# Patient Record
Sex: Male | Born: 1935 | Race: White | Hispanic: No | Marital: Married | Smoking: Never smoker
Health system: Southern US, Community
[De-identification: ages and names within clinical notes are randomized; demographics above are authoritative.]

## PROBLEM LIST (undated history)

## (undated) DIAGNOSIS — H409 Unspecified glaucoma: Secondary | ICD-10-CM

## (undated) DIAGNOSIS — D4819 Other specified neoplasm of uncertain behavior of connective and other soft tissue: Secondary | ICD-10-CM

## (undated) DIAGNOSIS — T8859XA Other complications of anesthesia, initial encounter: Secondary | ICD-10-CM

## (undated) DIAGNOSIS — G4730 Sleep apnea, unspecified: Secondary | ICD-10-CM

## (undated) DIAGNOSIS — T4145XA Adverse effect of unspecified anesthetic, initial encounter: Secondary | ICD-10-CM

## (undated) DIAGNOSIS — M1990 Unspecified osteoarthritis, unspecified site: Secondary | ICD-10-CM

## (undated) DIAGNOSIS — I219 Acute myocardial infarction, unspecified: Secondary | ICD-10-CM

## (undated) DIAGNOSIS — I4891 Unspecified atrial fibrillation: Secondary | ICD-10-CM

## (undated) DIAGNOSIS — IMO0002 Reserved for concepts with insufficient information to code with codable children: Secondary | ICD-10-CM

## (undated) DIAGNOSIS — F0390 Unspecified dementia without behavioral disturbance: Secondary | ICD-10-CM

## (undated) DIAGNOSIS — I639 Cerebral infarction, unspecified: Secondary | ICD-10-CM

## (undated) DIAGNOSIS — I10 Essential (primary) hypertension: Secondary | ICD-10-CM

## (undated) DIAGNOSIS — G43909 Migraine, unspecified, not intractable, without status migrainosus: Secondary | ICD-10-CM

## (undated) DIAGNOSIS — I2510 Atherosclerotic heart disease of native coronary artery without angina pectoris: Secondary | ICD-10-CM

## (undated) DIAGNOSIS — E7800 Pure hypercholesterolemia, unspecified: Secondary | ICD-10-CM

## (undated) DIAGNOSIS — D481 Neoplasm of uncertain behavior of connective and other soft tissue: Secondary | ICD-10-CM

## (undated) DIAGNOSIS — E119 Type 2 diabetes mellitus without complications: Secondary | ICD-10-CM

## (undated) HISTORY — PX: TONSILLECTOMY: SUR1361

## (undated) HISTORY — PX: CATARACT EXTRACTION W/ INTRAOCULAR LENS  IMPLANT, BILATERAL: SHX1307

## (undated) HISTORY — DX: Pure hypercholesterolemia, unspecified: E78.00

## (undated) HISTORY — DX: Atherosclerotic heart disease of native coronary artery without angina pectoris: I25.10

## (undated) HISTORY — DX: Migraine, unspecified, not intractable, without status migrainosus: G43.909

## (undated) HISTORY — PX: APPENDECTOMY: SHX54

## (undated) HISTORY — PX: HERNIA REPAIR: SHX51

## (undated) HISTORY — PX: HEMORRHOID SURGERY: SHX153

## (undated) HISTORY — DX: Unspecified osteoarthritis, unspecified site: M19.90

## (undated) HISTORY — DX: Sleep apnea, unspecified: G47.30

## (undated) HISTORY — DX: Reserved for concepts with insufficient information to code with codable children: IMO0002

## (undated) HISTORY — PX: CHOLECYSTECTOMY: SHX55

## (undated) HISTORY — DX: Type 2 diabetes mellitus without complications: E11.9

## (undated) HISTORY — DX: Essential (primary) hypertension: I10

## (undated) HISTORY — DX: Unspecified glaucoma: H40.9

---

## 1984-10-09 HISTORY — PX: OTHER SURGICAL HISTORY: SHX169

## 1994-10-09 DIAGNOSIS — I219 Acute myocardial infarction, unspecified: Secondary | ICD-10-CM

## 1994-10-09 HISTORY — DX: Acute myocardial infarction, unspecified: I21.9

## 1995-04-09 HISTORY — PX: CORONARY ARTERY BYPASS GRAFT: SHX141

## 2005-01-26 ENCOUNTER — Ambulatory Visit: Payer: Self-pay | Admitting: Gastroenterology

## 2005-02-07 ENCOUNTER — Ambulatory Visit: Payer: Self-pay | Admitting: Internal Medicine

## 2005-05-17 ENCOUNTER — Emergency Department: Payer: Self-pay | Admitting: Emergency Medicine

## 2006-01-16 ENCOUNTER — Ambulatory Visit: Payer: Self-pay | Admitting: Internal Medicine

## 2007-09-10 ENCOUNTER — Ambulatory Visit: Payer: Self-pay | Admitting: Internal Medicine

## 2008-04-23 ENCOUNTER — Ambulatory Visit: Payer: Self-pay | Admitting: Gastroenterology

## 2009-08-16 ENCOUNTER — Ambulatory Visit: Payer: Self-pay | Admitting: Internal Medicine

## 2010-12-28 ENCOUNTER — Ambulatory Visit: Payer: Self-pay | Admitting: Internal Medicine

## 2011-01-08 ENCOUNTER — Ambulatory Visit: Payer: Self-pay | Admitting: Internal Medicine

## 2011-02-07 ENCOUNTER — Ambulatory Visit: Payer: Self-pay | Admitting: Internal Medicine

## 2011-07-03 ENCOUNTER — Ambulatory Visit: Payer: Self-pay | Admitting: Internal Medicine

## 2012-01-30 ENCOUNTER — Ambulatory Visit: Payer: Self-pay | Admitting: Gastroenterology

## 2012-01-30 LAB — HM COLONOSCOPY

## 2012-02-01 LAB — PATHOLOGY REPORT

## 2012-02-28 DIAGNOSIS — N400 Enlarged prostate without lower urinary tract symptoms: Secondary | ICD-10-CM | POA: Insufficient documentation

## 2012-08-20 ENCOUNTER — Encounter: Payer: Self-pay | Admitting: Internal Medicine

## 2012-08-20 ENCOUNTER — Ambulatory Visit (INDEPENDENT_AMBULATORY_CARE_PROVIDER_SITE_OTHER): Payer: Medicare Other | Admitting: Internal Medicine

## 2012-08-20 VITALS — BP 136/80 | HR 64 | Temp 98.0°F | Ht 68.0 in | Wt 180.0 lb

## 2012-08-20 DIAGNOSIS — I10 Essential (primary) hypertension: Secondary | ICD-10-CM

## 2012-08-20 DIAGNOSIS — I2510 Atherosclerotic heart disease of native coronary artery without angina pectoris: Secondary | ICD-10-CM

## 2012-08-20 DIAGNOSIS — R739 Hyperglycemia, unspecified: Secondary | ICD-10-CM

## 2012-08-20 DIAGNOSIS — E7800 Pure hypercholesterolemia, unspecified: Secondary | ICD-10-CM

## 2012-08-20 DIAGNOSIS — R7309 Other abnormal glucose: Secondary | ICD-10-CM

## 2012-08-20 DIAGNOSIS — E118 Type 2 diabetes mellitus with unspecified complications: Secondary | ICD-10-CM | POA: Insufficient documentation

## 2012-08-20 NOTE — Patient Instructions (Signed)
It was nice seeing you today.  I am glad you have been doing well.  Keep a close eye on your leg and let me know if any change or if it does not resolve.  We will schedule your labs and follow up.

## 2012-08-30 ENCOUNTER — Other Ambulatory Visit: Payer: Medicare Other

## 2012-08-30 ENCOUNTER — Other Ambulatory Visit (INDEPENDENT_AMBULATORY_CARE_PROVIDER_SITE_OTHER): Payer: Medicare Other

## 2012-08-30 DIAGNOSIS — R739 Hyperglycemia, unspecified: Secondary | ICD-10-CM

## 2012-08-30 DIAGNOSIS — R7309 Other abnormal glucose: Secondary | ICD-10-CM

## 2012-08-30 DIAGNOSIS — E7800 Pure hypercholesterolemia, unspecified: Secondary | ICD-10-CM

## 2012-08-30 DIAGNOSIS — I10 Essential (primary) hypertension: Secondary | ICD-10-CM

## 2012-08-30 LAB — LIPID PANEL
HDL: 40.5 mg/dL (ref >39.00)
LDL Cholesterol: 59 mg/dL (ref 0–99)
Total CHOL/HDL Ratio: 3
Triglycerides: 198 mg/dL — ABNORMAL HIGH (ref 0.0–149.0)

## 2012-08-30 LAB — HEPATIC FUNCTION PANEL
ALT: 22 U/L (ref 0–53)
AST: 27 U/L (ref 0–37)
Albumin: 3.7 g/dL (ref 3.5–5.2)
Alkaline Phosphatase: 44 U/L (ref 39–117)
Bilirubin, Direct: 0.2 mg/dL (ref 0.0–0.3)
Total Bilirubin: 1 mg/dL (ref 0.3–1.2)
Total Protein: 6.6 g/dL (ref 6.0–8.3)

## 2012-08-30 LAB — BASIC METABOLIC PANEL
BUN: 13 mg/dL (ref 6–23)
CO2: 27 mEq/L (ref 19–32)
Calcium: 8.9 mg/dL (ref 8.4–10.5)
Glucose, Bld: 147 mg/dL — ABNORMAL HIGH (ref 70–99)
Sodium: 137 mEq/L (ref 135–145)

## 2012-08-30 LAB — HEMOGLOBIN A1C: Hgb A1c MFr Bld: 6.6 % — ABNORMAL HIGH (ref 4.6–6.5)

## 2012-09-08 ENCOUNTER — Encounter: Payer: Self-pay | Admitting: Internal Medicine

## 2012-09-08 NOTE — Assessment & Plan Note (Signed)
On Vytorin.  Low cholesterol diet and exercise.  Check lipid panel and liver function.    

## 2012-09-08 NOTE — Progress Notes (Signed)
  Subjective:    Patient ID: Kyle Richardson, male    DOB: [birth year], 78 y.o.   MRN: [id number]  HPI 78 year old male with past history of CAD s/p bypass graft x 4 (followed by Dr Darrold Junker), hypertension, hyperglycemia and hypercholesterolemia who comes in today for a scheduled follow up.  States he has been doing well.  No chest pain or tightness.  Breathing stable.  Saw Dr Rosita Kea for his left third finger.  Had surgery.  Did not help.  Still with some pain.  Discussed follow up with Dr Rosita Kea.  Saw Dr Orland Jarred.  Feet ok.  Sees Dr Darrold Junker.  Last check - stable.  He did hit his right leg on a stool - last week.  Hematoma.  Resolving.  No pain with ambulation.  Overall he feels he is doing well.    Past Medical History  Diagnosis Date  . 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  s/p PTCA of the mid RCA 1/96, also  s/p  CABG x 4 7/96  . Hypertension   . Hypercholesterolemia   . Degenerative disc disease   . Migraine headache     history of  . Sleep apnea   . Diabetes mellitus   . Osteoarthritis   . Glaucoma     Review of Systems Patient denies any headache, lightheadedness or dizziness.  No significant sinus or allergy symptoms.  No chest pain, tightness or palpitations.  No increased shortness of breath, cough or congestion.  No nausea or vomiting.  No abdominal pain or cramping.  No bowel change, such as diarrhea, constipation, BRBPR or melana.  No urine change.        Objective:   Physical Exam Filed Vitals:   08/20/12 0944  BP: 136/80  Pulse: 64  Temp: 98 F (22.50 C)   78 year old male in no acute distress.   HEENT:  Nares - clear.  OP- without lesions or erythema.  NECK:  Supple, nontender.  No audible bruit.   HEART:  Appears to be regular. LUNGS:  Without crackles or wheezing audible.  Respirations even and unlabored.   RADIAL PULSE:  Equal bilaterally.  ABDOMEN:  Soft, nontender.  No audible abdominal bruit.   EXTREMITIES:  No increased  edema to be present.  Resolving hematoma right lower leg.  No evidence of active infection.                   Assessment & Plan:  HEMATOMA.  Resolving.  Follow.  Notify me if any problems.    GU.  Saw Dr Logan Bores - urologist.  States everything checked out fine.    MSK.  S/p surgery left third finger.  Continue to follow up with Dr Rosita Kea.    GI.  Colonoscopy 7/09 revealed diverticulosis and internal hemorrhoids.  Currently asymptomatic.   HEALTH MAINTENANCE.  Physical 12/14/11.  Prostate check through Dr Michel Harrow office.  Colonoscopy as outlined.  Last PSA that I have - .67 (06/13/11).

## 2012-09-08 NOTE — Assessment & Plan Note (Signed)
Low carb diet and exercise.  Check met b and a1c.  Just saw Dr Troxler for his foot exam.   

## 2012-09-08 NOTE — Assessment & Plan Note (Signed)
Doing well.  Continue risk factor modification.  Continue follow up with Dr Paraschos.       

## 2012-09-08 NOTE — Assessment & Plan Note (Signed)
Blood pressure as outlined.  Same meds.  Check metabolic panel.  

## 2012-09-25 ENCOUNTER — Telehealth: Payer: Self-pay | Admitting: Internal Medicine

## 2012-09-25 ENCOUNTER — Other Ambulatory Visit (INDEPENDENT_AMBULATORY_CARE_PROVIDER_SITE_OTHER): Payer: Medicare Other

## 2012-09-25 DIAGNOSIS — R233 Spontaneous ecchymoses: Secondary | ICD-10-CM

## 2012-09-25 LAB — CBC WITH DIFFERENTIAL/PLATELET
Basophils Relative: 0.3 % (ref 0.0–3.0)
Eosinophils Relative: 6.7 % — ABNORMAL HIGH (ref 0.0–5.0)
Hemoglobin: 16.2 g/dL (ref 13.0–17.0)
Lymphocytes Relative: 29.4 % (ref 12.0–46.0)
Monocytes Relative: 10.7 % (ref 3.0–12.0)
Neutro Abs: 3.3 10*3/uL (ref 1.4–7.7)
RBC: 4.94 Mil/uL (ref 4.22–5.81)
WBC: 6.2 10*3/uL (ref 4.5–10.5)

## 2012-09-25 LAB — APTT: aPTT: 30.8 s — ABNORMAL HIGH (ref 21.7–28.8)

## 2012-09-25 LAB — PROTIME-INR
INR: 1 ratio (ref 0.8–1.0)
Prothrombin Time: 10.3 s (ref 10.2–12.4)

## 2012-09-25 NOTE — Telephone Encounter (Signed)
Pt here.  Concerned about bruising around his eye.  Will call his opthalmologist.  Obtain labs.

## 2012-09-27 ENCOUNTER — Telehealth: Payer: Self-pay | Admitting: Internal Medicine

## 2012-09-27 NOTE — Telephone Encounter (Signed)
Wanting lab results

## 2012-09-30 NOTE — Telephone Encounter (Signed)
Dr. Lorin Picket, I see patient's PT/INR results however I don't see that any documentation has been added.  Will you please review his labs from the 18th and let me know what to tell patient.

## 2012-10-01 ENCOUNTER — Other Ambulatory Visit: Payer: Self-pay | Admitting: Internal Medicine

## 2012-10-01 DIAGNOSIS — R238 Other skin changes: Secondary | ICD-10-CM

## 2012-10-01 NOTE — Telephone Encounter (Signed)
Notify pt cbc (including platelet count) - within normal limits.  I also did two blood tests to check blood thinning level.  One is normal and the other just slightly elevated.  Nothing different I would do at this point.  I would like to repeat this test in the next couple of weeks to confirm stable.  Also, need to know how his eye is doing.  I will place order for labs, please schedule a non fasting lab time for him to come in.

## 2012-10-01 NOTE — Progress Notes (Signed)
Order placed for follow up labs 

## 2012-10-01 NOTE — Telephone Encounter (Signed)
Eye is better he went to the eye dr, said it was a ruptured blood vessel, however it would not effect his vision. Appointment scheduled. (Patient also wanted to know if you were going to do their son's referral.

## 2012-10-10 ENCOUNTER — Other Ambulatory Visit (INDEPENDENT_AMBULATORY_CARE_PROVIDER_SITE_OTHER): Payer: Medicare Other

## 2012-10-10 DIAGNOSIS — R238 Other skin changes: Secondary | ICD-10-CM

## 2012-10-21 ENCOUNTER — Other Ambulatory Visit: Payer: Self-pay | Admitting: Internal Medicine

## 2012-11-04 ENCOUNTER — Encounter: Payer: Self-pay | Admitting: Internal Medicine

## 2012-11-23 ENCOUNTER — Other Ambulatory Visit: Payer: Self-pay

## 2012-11-30 ENCOUNTER — Other Ambulatory Visit: Payer: Self-pay | Admitting: Internal Medicine

## 2012-12-02 ENCOUNTER — Encounter: Payer: Self-pay | Admitting: Internal Medicine

## 2012-12-02 ENCOUNTER — Ambulatory Visit (INDEPENDENT_AMBULATORY_CARE_PROVIDER_SITE_OTHER): Payer: Medicare Other | Admitting: Internal Medicine

## 2012-12-02 VITALS — BP 150/64 | HR 64 | Temp 97.8°F | Ht 68.0 in | Wt 184.8 lb

## 2012-12-02 DIAGNOSIS — Z125 Encounter for screening for malignant neoplasm of prostate: Secondary | ICD-10-CM

## 2012-12-02 DIAGNOSIS — E7800 Pure hypercholesterolemia, unspecified: Secondary | ICD-10-CM

## 2012-12-02 DIAGNOSIS — I2510 Atherosclerotic heart disease of native coronary artery without angina pectoris: Secondary | ICD-10-CM

## 2012-12-02 DIAGNOSIS — R7309 Other abnormal glucose: Secondary | ICD-10-CM

## 2012-12-02 DIAGNOSIS — I10 Essential (primary) hypertension: Secondary | ICD-10-CM

## 2012-12-02 NOTE — Telephone Encounter (Signed)
Received refill request electronically from pharmacy. Medication requested is not the nasal spray on the medication list. Is it okay to refill?

## 2012-12-16 ENCOUNTER — Encounter: Payer: Self-pay | Admitting: Internal Medicine

## 2012-12-16 NOTE — Assessment & Plan Note (Signed)
Low carb diet and exercise.  Check met b and a1c.  Just saw Dr Orland Jarred for his foot exam.

## 2012-12-16 NOTE — Assessment & Plan Note (Signed)
Doing well.  Continue risk factor modification.  Continue follow up with Dr Darrold Junker.

## 2012-12-16 NOTE — Progress Notes (Signed)
Subjective:    Patient ID: Kyle Richardson, male    DOB: 1928/05/19, 79 y.o.   MRN: [id number]  HPI 79 year old male with past history of CAD s/p bypass graft x 4 (followed by Dr Darrold Junker), hypertension, hyperglycemia and hypercholesterolemia who comes in today to follow up on these issues as well as for a complete physical exam.  States he has been doing well.  No chest pain or tightness.  Breathing stable.  Saw Dr Orland Jarred.  Feet ok.  Sees Dr Darrold Junker.  Last check - stable.  Has follow up 3/14.  Has been having some issues with his right leg.  Will feel numb if he stands for a long period of time.  Has been evaluated.  Better.  States his blood pressure has been ok.    Past Medical History  Diagnosis Date  . 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  s/p PTCA of the mid RCA 1/96, also  s/p  CABG x 4 7/96  . Hypertension   . Hypercholesterolemia   . Degenerative disc disease   . Migraine headache     history of  . Sleep apnea   . Diabetes mellitus   . Osteoarthritis   . Glaucoma     Current Outpatient Prescriptions on File Prior to Visit  Medication Sig Dispense Refill  . aspirin 81 MG tablet Take 81 mg by mouth daily.      . Calcium Carbonate-Vitamin D (CALTRATE 600+D) 600-400 MG-UNIT per chew tablet Chew 1 tablet by mouth daily.      Marland Kitchen dipyridamole-aspirin (AGGRENOX) 200-25 MG per 12 hr capsule Take 1 capsule by mouth 2 (two) times daily.      Marland Kitchen ezetimibe-simvastatin (VYTORIN) 10-40 MG per tablet Take 1 tablet by mouth at bedtime.      . fluticasone (FLONASE) 50 MCG/ACT nasal spray TWO PUFFS IN EACH NOSTRIL ONCE A DAY  16 g  4  . latanoprost (XALATAN) 0.005 % ophthalmic solution Place 1 drop into both eyes at bedtime.      . mometasone (NASONEX) 50 MCG/ACT nasal spray Place 2 sprays into the nose daily.      . nitroGLYCERIN (NITROSTAT) 0.4 MG SL tablet Place 0.4 mg under the tongue every 5 (five) minutes as needed. As directed      . fish  oil-omega-3 fatty acids 1000 MG capsule Take 1,200 mg by mouth daily.       . multivitamin-iron-minerals-folic acid (CENTRUM) chewable tablet Chew 1 tablet by mouth daily.       No current facility-administered medications on file prior to visit.    Review of Systems Patient denies any headache, lightheadedness or dizziness.  No significant sinus or allergy symptoms.  No chest pain, tightness or palpitations.  No increased shortness of breath, cough or congestion.  No nausea or vomiting.  No acid reflux.  No abdominal pain or cramping.  No bowel change, such as diarrhea, constipation, BRBPR or melana.  No urine change.        Objective:   Physical Exam  Filed Vitals:   12/02/12 0955  BP: 150/64  Pulse: 64  Temp: 97.8 F (34.82 C)   79 year old male in no acute distress.  HEENT:  Nares - clear.  Oropharynx - without lesions. NECK:  Supple.  Nontender.  No audible carotid bruit.  HEART:  Appears to be regular.   LUNGS:  No crackles or wheezing audible.  Respirations even and unlabored.  RADIAL PULSE:  Equal bilaterally.  ABDOMEN:  Soft.  Nontender.  Bowel sounds present and normal.  No audible abdominal bruit.  GU:  Normal descended testicles.  No palpable testicular nodules.   RECTAL:  Could not appreciate any palpable prostate nodules.  Heme negative.   EXTREMITIES:  No increased edema present.  DP pulses palpable and equal bilaterally.            Assessment & Plan:  LEFT LEG NUMBNESS.  Evaluated.  Improved.  Follow.     GU.  Saw Dr Logan Bores - urologist.  No longer seeing.  GU exam today.  Desires to have psa checked.    MSK.  S/p surgery left third finger.  Continue to follow up with Dr Rosita Kea.    GI.  Colonoscopy 7/09 revealed diverticulosis and internal hemorrhoids.  Currently asymptomatic.   HEALTH MAINTENANCE.  Physical today.  Prostate check as outlined.  Colonoscopy as outlined.  Last PSA that I have - .67 (06/13/11).  Recheck with next labs.

## 2012-12-16 NOTE — Assessment & Plan Note (Signed)
On Vytorin.  Low cholesterol diet and exercise.  Check lipid panel and liver function.

## 2012-12-16 NOTE — Assessment & Plan Note (Signed)
Blood pressure as outlined.  Same meds.  Check metabolic panel.  

## 2012-12-23 ENCOUNTER — Emergency Department: Payer: Self-pay | Admitting: Unknown Physician Specialty

## 2012-12-23 LAB — URINALYSIS, COMPLETE
Bacteria: NONE SEEN
Bilirubin,UR: NEGATIVE
Glucose,UR: NEGATIVE mg/dL (ref 0–75)
Ketone: NEGATIVE
Leukocyte Esterase: NEGATIVE
Nitrite: NEGATIVE
Ph: 5 (ref 4.5–8.0)
Protein: 30
RBC,UR: 124 /HPF (ref 0–5)
Specific Gravity: 1.025 (ref 1.003–1.030)
Squamous Epithelial: <1
WBC UR: 7 /HPF (ref 0–5)

## 2012-12-23 LAB — COMPREHENSIVE METABOLIC PANEL
Alkaline Phosphatase: 52 U/L (ref 50–136)
Anion Gap: 6 — ABNORMAL LOW (ref 7–16)
BUN: 14 mg/dL (ref 7–18)
Calcium, Total: 8.5 mg/dL (ref 8.5–10.1)
Co2: 26 mmol/L (ref 21–32)
Creatinine: 1.06 mg/dL (ref 0.60–1.30)
EGFR (African American): >60
Glucose: 153 mg/dL — ABNORMAL HIGH (ref 65–99)
Osmolality: 281 (ref 275–301)
SGOT(AST): 29 U/L (ref 15–37)
SGPT (ALT): 30 U/L (ref 12–78)
Sodium: 139 mmol/L (ref 136–145)
Total Protein: 6.7 g/dL (ref 6.4–8.2)

## 2012-12-23 LAB — CBC
HCT: 45.6 % (ref 40.0–52.0)
HGB: 15.4 g/dL (ref 13.0–18.0)
MCV: 97 fL (ref 80–100)
Platelet: 171 10*3/uL (ref 150–440)
RBC: 4.69 10*6/uL (ref 4.40–5.90)
RDW: 13 % (ref 11.5–14.5)
WBC: 7.5 10*3/uL (ref 3.8–10.6)

## 2012-12-23 LAB — TROPONIN I: Troponin-I: <0.02 ng/mL

## 2013-01-01 ENCOUNTER — Other Ambulatory Visit: Payer: Medicare Other

## 2013-01-01 DIAGNOSIS — N200 Calculus of kidney: Secondary | ICD-10-CM | POA: Insufficient documentation

## 2013-01-02 ENCOUNTER — Other Ambulatory Visit (INDEPENDENT_AMBULATORY_CARE_PROVIDER_SITE_OTHER): Payer: Medicare Other

## 2013-01-02 DIAGNOSIS — I10 Essential (primary) hypertension: Secondary | ICD-10-CM

## 2013-01-02 DIAGNOSIS — R739 Hyperglycemia, unspecified: Secondary | ICD-10-CM

## 2013-01-02 DIAGNOSIS — R7309 Other abnormal glucose: Secondary | ICD-10-CM

## 2013-01-02 DIAGNOSIS — Z125 Encounter for screening for malignant neoplasm of prostate: Secondary | ICD-10-CM

## 2013-01-02 DIAGNOSIS — E7800 Pure hypercholesterolemia, unspecified: Secondary | ICD-10-CM

## 2013-01-02 LAB — BASIC METABOLIC PANEL
BUN: 17 mg/dL (ref 6–23)
GFR: 69.38 mL/min (ref >60.00)
Glucose, Bld: 122 mg/dL — ABNORMAL HIGH (ref 70–99)
Potassium: 4.7 mEq/L (ref 3.5–5.1)

## 2013-01-02 LAB — HEPATIC FUNCTION PANEL
ALT: 24 U/L (ref 0–53)
Bilirubin, Direct: 0.2 mg/dL (ref 0.0–0.3)
Total Bilirubin: 0.7 mg/dL (ref 0.3–1.2)

## 2013-01-02 LAB — LIPID PANEL
Cholesterol: 132 mg/dL (ref 0–200)
LDL Cholesterol: 58 mg/dL (ref 0–99)

## 2013-01-04 ENCOUNTER — Telehealth: Payer: Self-pay | Admitting: Internal Medicine

## 2013-01-04 NOTE — Telephone Encounter (Signed)
Message copied by Charm Barges on Sat Jan 04, 2013  6:53 PM ------      Message from: Marlene Lard      Created: Fri Jan 03, 2013 12:07 PM       Patient said that he can not make this appointment. Wanted to to see if you can find him another appointment. ------

## 2013-01-04 NOTE — Telephone Encounter (Signed)
Reviewed result note.  See if pt can come in 01/06/13 - 4:15.  Thanks.

## 2013-01-06 ENCOUNTER — Encounter: Payer: Self-pay | Admitting: Internal Medicine

## 2013-01-06 ENCOUNTER — Ambulatory Visit (INDEPENDENT_AMBULATORY_CARE_PROVIDER_SITE_OTHER): Payer: Medicare Other | Admitting: Internal Medicine

## 2013-01-06 VITALS — BP 140/70 | HR 72 | Temp 98.3°F | Ht 68.0 in | Wt 181.5 lb

## 2013-01-06 DIAGNOSIS — R739 Hyperglycemia, unspecified: Secondary | ICD-10-CM

## 2013-01-06 DIAGNOSIS — I2510 Atherosclerotic heart disease of native coronary artery without angina pectoris: Secondary | ICD-10-CM

## 2013-01-06 DIAGNOSIS — I10 Essential (primary) hypertension: Secondary | ICD-10-CM

## 2013-01-06 DIAGNOSIS — R7309 Other abnormal glucose: Secondary | ICD-10-CM

## 2013-01-06 DIAGNOSIS — E7800 Pure hypercholesterolemia, unspecified: Secondary | ICD-10-CM

## 2013-01-06 NOTE — Telephone Encounter (Signed)
Spouse aware of time

## 2013-01-08 ENCOUNTER — Encounter: Payer: Self-pay | Admitting: Internal Medicine

## 2013-01-08 NOTE — Progress Notes (Signed)
Subjective:    Patient ID: Kyle Richardson, male    DOB: Jul 23, 1928, 79 y.o.   MRN: [id number]  HPI 79 year old male with past history of CAD s/p bypass graft x 4 (followed by Dr Darrold Junker), hypertension, hyperglycemia and hypercholesterolemia who comes in today for a scheduled follow up.  States he has been doing well.  No chest pain or tightness.  Breathing stable.  Sees Dr Darrold Junker.  Planning for stress test within the week.  Here to discuss his sugars.  Blood sugars in the am averaging 120-130.  PM sugars 128-130.  A1c just checked - 7.0.  Has been watching more now what he is eating.  Blood pressures have been averaging 120-140/60-70s.  Recently had a kidney stone.  Was evaluated in the ER.  CT scan.  Passed the stone.  Has followed up with dr Logan Bores (his urologist).  Doing well now.      Past Medical History  Diagnosis Date  . 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  s/p PTCA of the mid RCA 1/96, also  s/p  CABG x 4 7/96  . Hypertension   . Hypercholesterolemia   . Degenerative disc disease   . Migraine headache     history of  . Sleep apnea   . Diabetes mellitus   . Osteoarthritis   . Glaucoma     Current Outpatient Prescriptions on File Prior to Visit  Medication Sig Dispense Refill  . aspirin 81 MG tablet Take 81 mg by mouth daily.      . Calcium Carbonate-Vitamin D (CALTRATE 600+D) 600-400 MG-UNIT per chew tablet Chew 1 tablet by mouth daily.      Marland Kitchen dipyridamole-aspirin (AGGRENOX) 200-25 MG per 12 hr capsule Take 1 capsule by mouth 2 (two) times daily.      Marland Kitchen ezetimibe-simvastatin (VYTORIN) 10-40 MG per tablet Take 1 tablet by mouth at bedtime.      . fish oil-omega-3 fatty acids 1000 MG capsule Take 1,200 mg by mouth daily.       . fluticasone (FLONASE) 50 MCG/ACT nasal spray TWO PUFFS IN EACH NOSTRIL ONCE A DAY  16 g  4  . latanoprost (XALATAN) 0.005 % ophthalmic solution Place 1 drop into both eyes at bedtime.      . mometasone (NASONEX)  50 MCG/ACT nasal spray Place 2 sprays into the nose daily.      . multivitamin-iron-minerals-folic acid (CENTRUM) chewable tablet Chew 1 tablet by mouth daily.      . nitroGLYCERIN (NITROSTAT) 0.4 MG SL tablet Place 0.4 mg under the tongue every 5 (five) minutes as needed. As directed       No current facility-administered medications on file prior to visit.    Review of Systems Patient denies any headache, lightheadedness or dizziness.  No significant sinus or allergy symptoms.  No chest pain, tightness or palpitations.  No increased shortness of breath, cough or congestion.  No nausea or vomiting.  No acid reflux.  No abdominal pain or cramping.  No bowel change, such as diarrhea, constipation, BRBPR or melana.  No urine change.  Passed the kidney stone.  Doing better now.  Sugars as outlined.  Blood pressures as outlined.       Objective:   Physical Exam  Filed Vitals:   01/06/13 1654  BP: 140/70  Pulse: 72  Temp: 98.3 F (31.17 C)   79 year old male in no acute distress.  HEENT:  Nares -  clear.  Oropharynx - without lesions. NECK:  Supple.  Nontender.  No audible carotid bruit.  HEART:  Appears to be regular.   LUNGS:  No crackles or wheezing audible.  Respirations even and unlabored.   RADIAL PULSE:  Equal bilaterally.  ABDOMEN:  Soft.  Nontender.  Bowel sounds present and normal.  No audible abdominal bruit.    EXTREMITIES:  No increased edema present.  DP pulses palpable and equal bilaterally.            Assessment & Plan:  LEFT LEG NUMBNESS.  Evaluated.  Improved.  Follow.     GU.  Saw Dr Logan Bores - urologist.  S/p passage of kidney stone.  Doing better now.  Obtain records.    MSK.  S/p surgery left third finger.  Continue to follow up with Dr Rosita Kea.    GI.  Colonoscopy 01/30/12 revealed diverticulosis, redundant colon, one 2mm polyp in the transverse colon and internal hemorrhoids.  Currently asymptomatic.   HEALTH MAINTENANCE.  Physical last visit.   Colonoscopy as  outlined.  Last PSA - .78 (01/02/13).

## 2013-02-03 ENCOUNTER — Ambulatory Visit (INDEPENDENT_AMBULATORY_CARE_PROVIDER_SITE_OTHER): Payer: Medicare Other | Admitting: Internal Medicine

## 2013-02-03 ENCOUNTER — Encounter: Payer: Self-pay | Admitting: Internal Medicine

## 2013-02-03 VITALS — BP 140/70 | HR 63 | Temp 98.4°F | Ht 68.0 in | Wt 179.2 lb

## 2013-02-03 DIAGNOSIS — I2510 Atherosclerotic heart disease of native coronary artery without angina pectoris: Secondary | ICD-10-CM

## 2013-02-03 DIAGNOSIS — I10 Essential (primary) hypertension: Secondary | ICD-10-CM

## 2013-02-03 DIAGNOSIS — R7309 Other abnormal glucose: Secondary | ICD-10-CM

## 2013-02-03 DIAGNOSIS — E7800 Pure hypercholesterolemia, unspecified: Secondary | ICD-10-CM

## 2013-02-03 DIAGNOSIS — R739 Hyperglycemia, unspecified: Secondary | ICD-10-CM

## 2013-02-03 MED ORDER — PANTOPRAZOLE SODIUM 40 MG PO TBEC: 40.0000 mg | DELAYED_RELEASE_TABLET | Freq: Every day | ORAL | Status: DC

## 2013-02-03 NOTE — Assessment & Plan Note (Signed)
Doing well.  Continue risk factor modification.  Continue follow up with Dr Darrold Junker.

## 2013-02-03 NOTE — Progress Notes (Signed)
Subjective:    Patient ID: Kyle Richardson, male    DOB: 05-19-1928, 79 y.o.   MRN: [id number]  HPI 79 year old male with past history of CAD s/p bypass graft x 4 (followed by Dr Darrold Junker), hypertension, hyperglycemia and hypercholesterolemia who comes in today for a scheduled follow up.  States he has been doing well.  No chest pain or tightness.  Breathing stable.  Sees Dr Darrold Junker.  Stress test was ok.  Here to discuss his sugars.  Blood sugars in the am averaging 118.  PM sugars lower.  A1c checked - 7.0.  Has been watching more now what he is eating.  Sugars trending down as outlined.  Blood pressures have been averaging 120-140/60-70s.  Had an appt to see Donney Rankins for his hand.  His hand is better now.  Occasionally may notice some acid reflux.  Has been taking his wife's protonix.  Was asking if he could have a prescription for this.  No dysphagia.  Bowels stable.      Past Medical History  Diagnosis Date  . 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  s/p PTCA of the mid RCA 1/96, also  s/p  CABG x 4 7/96  . Hypertension   . Hypercholesterolemia   . Degenerative disc disease   . Migraine headache     history of  . Sleep apnea   . Diabetes mellitus   . Osteoarthritis   . Glaucoma     Current Outpatient Prescriptions on File Prior to Visit  Medication Sig Dispense Refill  . aspirin 81 MG tablet Take 81 mg by mouth daily.      . Calcium Carbonate-Vitamin D (CALTRATE 600+D) 600-400 MG-UNIT per chew tablet Chew 1 tablet by mouth daily.      Marland Kitchen dipyridamole-aspirin (AGGRENOX) 200-25 MG per 12 hr capsule Take 1 capsule by mouth 2 (two) times daily.      Marland Kitchen ezetimibe-simvastatin (VYTORIN) 10-40 MG per tablet Take 1 tablet by mouth at bedtime.      . fish oil-omega-3 fatty acids 1000 MG capsule Take 1,200 mg by mouth daily.       . fluticasone (FLONASE) 50 MCG/ACT nasal spray TWO PUFFS IN EACH NOSTRIL ONCE A DAY  16 g  4  . latanoprost (XALATAN) 0.005 %  ophthalmic solution Place 1 drop into both eyes at bedtime.      . multivitamin-iron-minerals-folic acid (CENTRUM) chewable tablet Chew 1 tablet by mouth daily.      . nitroGLYCERIN (NITROSTAT) 0.4 MG SL tablet Place 0.4 mg under the tongue every 5 (five) minutes as needed. As directed       No current facility-administered medications on file prior to visit.    Review of Systems Patient denies any headache, lightheadedness or dizziness.  No significant sinus or allergy symptoms.  No chest pain, tightness or palpitations.  No increased shortness of breath, cough or congestion.  No nausea or vomiting.   No abdominal pain or cramping.  No bowel change, such as diarrhea, constipation, BRBPR or melana.  No urine change.  Previously passed a kidney stone.  Doing well now.  Sugars as outlined.  Blood pressures as outlined.  Overall he feels things are stable.      Objective:   Physical Exam  Filed Vitals:   02/03/13 0910  BP: 140/70  Pulse: 63  Temp: 98.4 F (60.42 C)   79 year old male in no acute distress.  HEENT:  Nares - clear.  Oropharynx - without lesions. NECK:  Supple.  Nontender.  No audible carotid bruit.  HEART:  Appears to be regular.   LUNGS:  No crackles or wheezing audible.  Respirations even and unlabored.   RADIAL PULSE:  Equal bilaterally.  ABDOMEN:  Soft.  Nontender.  Bowel sounds present and normal.  No audible abdominal bruit.    EXTREMITIES:  No increased edema present.  DP pulses palpable and equal bilaterally.            Assessment & Plan:  LEFT LEG NUMBNESS.  Evaluated.  Improved.  Follow.     GU.  Saw Dr Logan Bores - urologist.  S/p passage of kidney stone.  Doing better now.  Need records.    MSK.  S/p surgery left third finger.  Continue to follow up with Dr Rosita Kea.  Previous left hand discomfort has improved.    GI.  Colonoscopy 01/30/12 revealed diverticulosis, redundant colon, one 2mm polyp in the transverse colon and internal hemorrhoids.  Currently  asymptomatic.   HEALTH MAINTENANCE.  Physical 12/02/12.   Colonoscopy as outlined.  Last PSA - .78 (01/02/13).

## 2013-02-03 NOTE — Assessment & Plan Note (Signed)
On Vytorin.  Low cholesterol diet and exercise.  Follow lipid panel and liver function.   

## 2013-02-03 NOTE — Assessment & Plan Note (Signed)
Blood pressure as outlined.  Same medication regimen.  Follow.   

## 2013-02-03 NOTE — Assessment & Plan Note (Signed)
A1c 7.0.  Sugars doing better.  Diet and exercise.  Follow.

## 2013-02-03 NOTE — Assessment & Plan Note (Addendum)
A1c 7.0.  Sugars doing better.  Diet and exercise.  Follow.  He is up to date with eye checks.

## 2013-02-03 NOTE — Assessment & Plan Note (Signed)
Blood pressure as outlined.  Same medication regimen.  Follow.  Will check metabolic panel with next labs.

## 2013-02-03 NOTE — Assessment & Plan Note (Signed)
Doing well.  Continue risk factor modification.  Continue follow up with Dr Paraschos.  Recent stress test negative.   

## 2013-02-05 ENCOUNTER — Encounter: Payer: Self-pay | Admitting: Internal Medicine

## 2013-05-01 ENCOUNTER — Other Ambulatory Visit (INDEPENDENT_AMBULATORY_CARE_PROVIDER_SITE_OTHER): Payer: Medicare Other

## 2013-05-01 ENCOUNTER — Other Ambulatory Visit: Payer: Medicare Other

## 2013-05-01 DIAGNOSIS — I10 Essential (primary) hypertension: Secondary | ICD-10-CM

## 2013-05-01 DIAGNOSIS — R739 Hyperglycemia, unspecified: Secondary | ICD-10-CM

## 2013-05-01 DIAGNOSIS — E7800 Pure hypercholesterolemia, unspecified: Secondary | ICD-10-CM

## 2013-05-01 DIAGNOSIS — I2510 Atherosclerotic heart disease of native coronary artery without angina pectoris: Secondary | ICD-10-CM

## 2013-05-01 DIAGNOSIS — R7309 Other abnormal glucose: Secondary | ICD-10-CM

## 2013-05-01 LAB — BASIC METABOLIC PANEL
CO2: 27 mEq/L (ref 19–32)
Calcium: 9.2 mg/dL (ref 8.4–10.5)
Creatinine, Ser: 1.1 mg/dL (ref 0.4–1.5)
GFR: 65.8 mL/min (ref >60.00)
Glucose, Bld: 119 mg/dL — ABNORMAL HIGH (ref 70–99)
Sodium: 139 mEq/L (ref 135–145)

## 2013-05-01 LAB — LIPID PANEL
Total CHOL/HDL Ratio: 4
VLDL: 50.2 mg/dL — ABNORMAL HIGH (ref 0.0–40.0)

## 2013-05-01 LAB — MICROALBUMIN / CREATININE URINE RATIO
Microalb Creat Ratio: 0.6 mg/g (ref 0.0–30.0)
Microalb, Ur: 1.5 mg/dL (ref 0.0–1.9)

## 2013-05-01 LAB — HEPATIC FUNCTION PANEL
AST: 25 U/L (ref 0–37)
Albumin: 3.7 g/dL (ref 3.5–5.2)
Alkaline Phosphatase: 44 U/L (ref 39–117)
Bilirubin, Direct: 0.1 mg/dL (ref 0.0–0.3)
Total Bilirubin: 1 mg/dL (ref 0.3–1.2)

## 2013-05-05 ENCOUNTER — Encounter: Payer: Self-pay | Admitting: Internal Medicine

## 2013-05-07 ENCOUNTER — Encounter: Payer: Self-pay | Admitting: Internal Medicine

## 2013-05-07 ENCOUNTER — Ambulatory Visit (INDEPENDENT_AMBULATORY_CARE_PROVIDER_SITE_OTHER): Payer: Medicare Other | Admitting: Internal Medicine

## 2013-05-07 VITALS — BP 110/70 | HR 59 | Temp 97.9°F | Ht 68.0 in | Wt 177.2 lb

## 2013-05-07 DIAGNOSIS — E119 Type 2 diabetes mellitus without complications: Secondary | ICD-10-CM

## 2013-05-07 DIAGNOSIS — E7800 Pure hypercholesterolemia, unspecified: Secondary | ICD-10-CM

## 2013-05-07 DIAGNOSIS — I10 Essential (primary) hypertension: Secondary | ICD-10-CM

## 2013-05-07 DIAGNOSIS — I2510 Atherosclerotic heart disease of native coronary artery without angina pectoris: Secondary | ICD-10-CM

## 2013-05-07 DIAGNOSIS — G43909 Migraine, unspecified, not intractable, without status migrainosus: Secondary | ICD-10-CM

## 2013-05-07 LAB — HM DIABETES FOOT EXAM

## 2013-05-10 ENCOUNTER — Encounter: Payer: Self-pay | Admitting: Internal Medicine

## 2013-05-10 DIAGNOSIS — G43909 Migraine, unspecified, not intractable, without status migrainosus: Secondary | ICD-10-CM | POA: Insufficient documentation

## 2013-05-10 NOTE — Progress Notes (Signed)
Subjective:    Patient ID: Kyle Richardson, male    DOB: [birth year], 80 y.o.   MRN: [id number]  HPI 79 year old male with past history of CAD s/p bypass graft x 4 (followed by Dr Darrold Junker), hypertension, hyperglycemia and hypercholesterolemia who comes in today for a scheduled follow up.  States he has been doing well.  No chest pain or tightness.  Breathing stable.  Sees Dr Darrold Junker.  Stress test was ok.  Overall he feels he is doing well from a cardiac standpoint.  States his blood pressures have been doing well (118-120/70s).  Eating and drinking well.  Bowels stable.        Past Medical History  Diagnosis Date  . 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  s/p PTCA of the mid RCA 1/96, also  s/p  CABG x 4 7/96  . Hypertension   . Hypercholesterolemia   . Degenerative disc disease   . Migraine headache     history of  . Sleep apnea   . Diabetes mellitus   . Osteoarthritis   . Glaucoma     Current Outpatient Prescriptions on File Prior to Visit  Medication Sig Dispense Refill  . aspirin 81 MG tablet Take 81 mg by mouth daily.      . Calcium Carbonate-Vitamin D (CALTRATE 600+D) 600-400 MG-UNIT per chew tablet Chew 1 tablet by mouth daily.      Marland Kitchen dipyridamole-aspirin (AGGRENOX) 200-25 MG per 12 hr capsule Take 1 capsule by mouth 2 (two) times daily.      Marland Kitchen ezetimibe-simvastatin (VYTORIN) 10-40 MG per tablet Take 1 tablet by mouth at bedtime.      . fish oil-omega-3 fatty acids 1000 MG capsule Take 1,200 mg by mouth daily.       . fluticasone (FLONASE) 50 MCG/ACT nasal spray TWO PUFFS IN EACH NOSTRIL ONCE A DAY  16 g  4  . latanoprost (XALATAN) 0.005 % ophthalmic solution Place 1 drop into both eyes at bedtime.      . multivitamin-iron-minerals-folic acid (CENTRUM) chewable tablet Chew 1 tablet by mouth daily.      . nitroGLYCERIN (NITROSTAT) 0.4 MG SL tablet Place 0.4 mg under the tongue every 5 (five) minutes as needed. As directed      .  pantoprazole (PROTONIX) 40 MG tablet Take 1 tablet (40 mg total) by mouth daily.  30 tablet  1   No current facility-administered medications on file prior to visit.    Review of Systems Patient denies any significant sinus headache, lightheadedness or dizziness.  Has some migraines.  Unchanged from his migraines.  Has been worked up by neurology.  No significant sinus or allergy symptoms.  No chest pain, tightness or palpitations.  No increased shortness of breath, cough or congestion.  No nausea or vomiting.   No abdominal pain or cramping.  No bowel change, such as diarrhea, constipation, BRBPR or melana.  No urine change.  Previously passed a kidney stone.  Doing well now.  Blood pressures as outlined.  Overall he feels things are stable.      Objective:   Physical Exam  Filed Vitals:   05/07/13 0911  BP: 110/70  Pulse: 59  Temp: 97.9 F (7.38 C)   79 year old male in no acute distress.  HEENT:  Nares - clear.  Oropharynx - without lesions. NECK:  Supple.  Nontender.  No audible carotid bruit.  HEART:  Appears to be regular.  LUNGS:  No crackles or wheezing audible.  Respirations even and unlabored.   RADIAL PULSE:  Equal bilaterally.  ABDOMEN:  Soft.  Nontender.  Bowel sounds present and normal.  No audible abdominal bruit.    EXTREMITIES:  No increased edema present.  DP pulses palpable and equal bilaterally.  FEET:  Without lesions.             Assessment & Plan:  LEFT LEG NUMBNESS.  Evaluated.  Improved.  Follow.     GU.  Saw Dr Logan Bores - urologist.  S/p passage of kidney stone.  Doing better.  No reoccurring problems.    MSK.  S/p surgery left third finger.  Continue to follow up with Dr Rosita Kea.  Previous left hand discomfort has improved.    GI.  Colonoscopy 01/30/12 revealed diverticulosis, redundant colon, one 2mm polyp in the transverse colon and internal hemorrhoids.  Currently asymptomatic.   HEALTH MAINTENANCE.  Physical 12/02/12.   Colonoscopy as outlined.  Last PSA  - .78 (01/02/13).

## 2013-05-10 NOTE — Assessment & Plan Note (Signed)
Blood pressure as outlined.  Same medication regimen.  Follow. Follow metabolic panel.  

## 2013-05-10 NOTE — Assessment & Plan Note (Signed)
On Vytorin.  Low cholesterol diet and exercise.  Follow lipid panel and liver function.   

## 2013-05-10 NOTE — Assessment & Plan Note (Signed)
Doing well.  Continue risk factor modification.  Continue follow up with Dr Paraschos.  Recent stress test negative.   

## 2013-05-10 NOTE — Assessment & Plan Note (Addendum)
Sugars doing better.  Diet and exercise.  A1c just checked 05/01/13 - 6.7.  Follow.  States he is up to date with eye checks.

## 2013-05-10 NOTE — Assessment & Plan Note (Signed)
Has had some episodes of migraines.  Typical of his migraines.  Worked up by neurology.  Stable.  Plans to follow up with neurology soon.

## 2013-05-14 ENCOUNTER — Other Ambulatory Visit: Payer: Self-pay

## 2013-05-17 ENCOUNTER — Encounter: Payer: Self-pay | Admitting: Internal Medicine

## 2013-06-18 ENCOUNTER — Encounter: Payer: Self-pay | Admitting: Internal Medicine

## 2013-07-01 ENCOUNTER — Telehealth: Payer: Self-pay | Admitting: Internal Medicine

## 2013-07-01 NOTE — Telephone Encounter (Signed)
Pt wanted to know if you have any samples of vitorian 10/40

## 2013-07-01 NOTE — Telephone Encounter (Signed)
Left message notifying patient that we do not have any samples

## 2013-07-03 ENCOUNTER — Ambulatory Visit (INDEPENDENT_AMBULATORY_CARE_PROVIDER_SITE_OTHER): Payer: Medicare Other | Admitting: *Deleted

## 2013-07-03 DIAGNOSIS — Z23 Encounter for immunization: Secondary | ICD-10-CM

## 2013-08-06 ENCOUNTER — Telehealth: Payer: Self-pay | Admitting: Internal Medicine

## 2013-08-06 NOTE — Telephone Encounter (Signed)
The patient is wanting his labs done here instead of going to [facility] and have the results faxed to his physician at [facility].

## 2013-08-09 ENCOUNTER — Encounter: Payer: Self-pay | Admitting: Internal Medicine

## 2013-08-11 ENCOUNTER — Encounter: Payer: Self-pay | Admitting: *Deleted

## 2013-08-13 ENCOUNTER — Other Ambulatory Visit: Payer: Self-pay | Admitting: Internal Medicine

## 2013-08-13 ENCOUNTER — Other Ambulatory Visit: Payer: Medicare Other

## 2013-08-13 DIAGNOSIS — I639 Cerebral infarction, unspecified: Secondary | ICD-10-CM

## 2013-08-13 LAB — BASIC METABOLIC PANEL
Anion Gap: 1 — ABNORMAL LOW (ref 7–16)
Calcium, Total: 8.9 mg/dL (ref 8.5–10.1)
Chloride: 105 mmol/L (ref 98–107)
Co2: 31 mmol/L (ref 21–32)
Creatinine: 0.97 mg/dL (ref 0.60–1.30)
EGFR (Non-African Amer.): >60
Glucose: 105 mg/dL — ABNORMAL HIGH (ref 65–99)
Osmolality: 274 (ref 275–301)
Potassium: 4.2 mmol/L (ref 3.5–5.1)
Sodium: 137 mmol/L (ref 136–145)

## 2013-08-13 LAB — LIPID PANEL
Cholesterol: 129 mg/dL (ref 0–200)
HDL Cholesterol: 47 mg/dL (ref 40–60)
Ldl Cholesterol, Calc: 49 mg/dL (ref 0–100)
VLDL Cholesterol, Calc: 33 mg/dL (ref 5–40)

## 2013-08-13 LAB — HEPATIC FUNCTION PANEL A (ARMC)
Bilirubin, Direct: 0.2 mg/dL (ref 0.00–0.20)
SGOT(AST): 29 U/L (ref 15–37)
Total Protein: 7 g/dL (ref 6.4–8.2)

## 2013-08-13 NOTE — Patient Instructions (Signed)
This order Platelet function assay can only be done at a hospital, he always got his other labs done that Dr.scott ordered, he was sent to [facility] to get his labs done

## 2013-08-15 ENCOUNTER — Encounter: Payer: Self-pay | Admitting: Internal Medicine

## 2013-08-15 ENCOUNTER — Encounter: Payer: Self-pay | Admitting: *Deleted

## 2013-08-15 LAB — HEMOGLOBIN A1C: Hemoglobin A1C: 7.1 % — ABNORMAL HIGH (ref 4.2–6.3)

## 2013-08-16 ENCOUNTER — Encounter: Payer: Self-pay | Admitting: Internal Medicine

## 2013-08-22 ENCOUNTER — Other Ambulatory Visit: Payer: Medicare Other

## 2013-08-25 ENCOUNTER — Telehealth: Payer: Self-pay | Admitting: Internal Medicine

## 2013-08-25 NOTE — Telephone Encounter (Signed)
I faxed it on 11/12. I have contacted [facility] this morning & they informed me that they were having some problems with their fax. I refaxed it this morning & again just now to a different fax number. Pt notified

## 2013-08-25 NOTE — Telephone Encounter (Signed)
Pt states paperwork was to be faxed last week regarding labs at [facility] that Dr. Lorin Picket needed to sign and send back to [facility].  Pt states he has received a call from [facility] today that they have not received the paperwork back.  Pt checking to ensure that we did receive the paperwork and also states [facility] has called [place] this morning regarding this.

## 2013-08-26 ENCOUNTER — Other Ambulatory Visit: Payer: Self-pay | Admitting: *Deleted

## 2013-08-26 ENCOUNTER — Telehealth: Payer: Self-pay | Admitting: Internal Medicine

## 2013-08-26 MED ORDER — EZETIMIBE-SIMVASTATIN 10-40 MG PO TABS: 1.0000 | ORAL_TABLET | Freq: Every day | ORAL | Status: DC

## 2013-08-26 NOTE — Telephone Encounter (Signed)
Rx sent electronically.  

## 2013-08-26 NOTE — Telephone Encounter (Signed)
Pt came in today checking to see if he can get any vitorn 10/40.  Pt is almost out of this

## 2013-09-01 LAB — HM DIABETES EYE EXAM

## 2013-09-11 ENCOUNTER — Ambulatory Visit (INDEPENDENT_AMBULATORY_CARE_PROVIDER_SITE_OTHER): Payer: Medicare Other | Admitting: Internal Medicine

## 2013-09-11 ENCOUNTER — Encounter: Payer: Self-pay | Admitting: Internal Medicine

## 2013-09-11 VITALS — BP 120/70 | HR 64 | Temp 98.0°F | Ht 68.0 in | Wt 184.2 lb

## 2013-09-11 DIAGNOSIS — I10 Essential (primary) hypertension: Secondary | ICD-10-CM

## 2013-09-11 DIAGNOSIS — I2510 Atherosclerotic heart disease of native coronary artery without angina pectoris: Secondary | ICD-10-CM

## 2013-09-11 DIAGNOSIS — G43909 Migraine, unspecified, not intractable, without status migrainosus: Secondary | ICD-10-CM

## 2013-09-11 DIAGNOSIS — E119 Type 2 diabetes mellitus without complications: Secondary | ICD-10-CM

## 2013-09-11 DIAGNOSIS — E7800 Pure hypercholesterolemia, unspecified: Secondary | ICD-10-CM

## 2013-09-11 NOTE — Progress Notes (Signed)
Pre-visit discussion using our clinic review tool. No additional management support is needed unless otherwise documented below in the visit note.  

## 2013-09-11 NOTE — Progress Notes (Signed)
Subjective:    Patient ID: Kyle Richardson, male    DOB: 09/11/1928, 79 y.o.   MRN: [id number]  HPI 79 year old male with past history of CAD s/p bypass graft x 4 (followed by Dr Darrold Junker), hypertension, hyperglycemia and hypercholesterolemia who comes in today for a scheduled follow up.  States he has been doing well.  No chest pain or tightness.  Breathing stable.  Sees Dr Darrold Junker.  Stress test was ok.  Overall he feels he is doing well from a cardiac standpoint.  States his blood pressures have been doing well (120's/70s).  Eating and drinking well.  Bowels stable.   Off aggrenox now.  Only taking aspirin.       Past Medical History  Diagnosis Date  . 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  s/p PTCA of the mid RCA 1/96, also  s/p  CABG x 4 7/96  . Hypertension   . Hypercholesterolemia   . Degenerative disc disease   . Migraine headache     history of  . Sleep apnea   . Diabetes mellitus   . Osteoarthritis   . Glaucoma     Current Outpatient Prescriptions on File Prior to Visit  Medication Sig Dispense Refill  . Calcium Carbonate-Vitamin D (CALTRATE 600+D) 600-400 MG-UNIT per chew tablet Chew 1 tablet by mouth daily.      Marland Kitchen ezetimibe-simvastatin (VYTORIN) 10-40 MG per tablet Take 1 tablet by mouth at bedtime.  30 tablet  5  . fluticasone (FLONASE) 50 MCG/ACT nasal spray TWO PUFFS IN EACH NOSTRIL ONCE A DAY  16 g  4  . latanoprost (XALATAN) 0.005 % ophthalmic solution Place 1 drop into both eyes at bedtime.      . multivitamin-iron-minerals-folic acid (CENTRUM) chewable tablet Chew 1 tablet by mouth daily.      . nitroGLYCERIN (NITROSTAT) 0.4 MG SL tablet Place 0.4 mg under the tongue every 5 (five) minutes as needed. As directed       No current facility-administered medications on file prior to visit.    Review of Systems Patient denies any significant headache, lightheadedness or dizziness.   No significant sinus or allergy symptoms.  No  chest pain, tightness or palpitations.  No increased shortness of breath, cough or congestion.  No nausea or vomiting.   No abdominal pain or cramping.  No bowel change, such as diarrhea, constipation, BRBPR or melana.  No urine change.  Doing well now.  Blood pressures as outlined.  Overall he feels things are stable and feels he is doing well.       Objective:   Physical Exam  Filed Vitals:   09/11/13 0925  BP: 120/70  Pulse: 64  Temp: 98 F (26.36 C)   79 year old male in no acute distress.  HEENT:  Nares - clear.  Oropharynx - without lesions. NECK:  Supple.  Nontender.  No audible carotid bruit.  HEART:  Appears to be regular.   LUNGS:  No crackles or wheezing audible.  Respirations even and unlabored.   RADIAL PULSE:  Equal bilaterally.  ABDOMEN:  Soft.  Nontender.  Bowel sounds present and normal.  No audible abdominal bruit.    EXTREMITIES:  No increased edema present.  DP pulses palpable and equal bilaterally.  FEET:  Without lesions.             Assessment & Plan:  LEFT LEG NUMBNESS.  Evaluated.  Improved.  Follow.  Not an  issue now.     GU.  Saw Dr Logan Bores - urologist.  S/p passage of kidney stone.  Doing better.  No reoccurring problems.    MSK.  S/p surgery left third finger.  Continue to follow up with Dr Rosita Kea.  Previous left hand discomfort has improved.    GI.  Colonoscopy 01/30/12 revealed diverticulosis, redundant colon, one 2mm polyp in the transverse colon and internal hemorrhoids.  Currently asymptomatic.   HEALTH MAINTENANCE.  Physical 12/02/12.   Colonoscopy as outlined.  Last PSA - .78 (01/02/13).

## 2013-09-14 ENCOUNTER — Encounter: Payer: Self-pay | Admitting: Internal Medicine

## 2013-09-14 NOTE — Assessment & Plan Note (Signed)
On Vytorin.  Low cholesterol diet and exercise.  Follow lipid panel and liver function.   

## 2013-09-14 NOTE — Assessment & Plan Note (Signed)
Has had some episodes of migraines.  Typical of his migraines.  Worked up by neurology.  Stable.  Not an issue now.    

## 2013-09-14 NOTE — Assessment & Plan Note (Signed)
Blood pressure as outlined.  Same medication regimen.  Follow. Follow metabolic panel.  

## 2013-09-14 NOTE — Assessment & Plan Note (Signed)
Doing well.  Continue risk factor modification.  Continue follow up with Dr Paraschos.  Recent stress test negative.   

## 2013-09-14 NOTE — Assessment & Plan Note (Signed)
Sugars have been doing better.  Diet and exercise.  A1c last checked 05/01/13 - 6.7.  Follow.  States he is up to date with eye checks.  Just evaluated two weeks ago.

## 2013-11-18 ENCOUNTER — Encounter: Payer: Self-pay | Admitting: Internal Medicine

## 2013-12-08 ENCOUNTER — Encounter: Payer: Self-pay | Admitting: Internal Medicine

## 2013-12-08 ENCOUNTER — Other Ambulatory Visit (INDEPENDENT_AMBULATORY_CARE_PROVIDER_SITE_OTHER): Payer: Medicare Other

## 2013-12-08 DIAGNOSIS — E119 Type 2 diabetes mellitus without complications: Secondary | ICD-10-CM

## 2013-12-08 DIAGNOSIS — E7800 Pure hypercholesterolemia, unspecified: Secondary | ICD-10-CM

## 2013-12-08 DIAGNOSIS — I2510 Atherosclerotic heart disease of native coronary artery without angina pectoris: Secondary | ICD-10-CM

## 2013-12-08 LAB — CBC WITH DIFFERENTIAL/PLATELET
Basophils Absolute: 0 10*3/uL (ref 0.0–0.1)
Basophils Relative: 0.4 % (ref 0.0–3.0)
EOS PCT: 4.6 % (ref 0.0–5.0)
Eosinophils Absolute: 0.2 10*3/uL (ref 0.0–0.7)
HEMATOCRIT: 47.2 % (ref 39.0–52.0)
HEMOGLOBIN: 15.6 g/dL (ref 13.0–17.0)
LYMPHS ABS: 2.1 10*3/uL (ref 0.7–4.0)
Lymphocytes Relative: 39.8 % (ref 12.0–46.0)
MCHC: 33.2 g/dL (ref 30.0–36.0)
MCV: 97 fl (ref 78.0–100.0)
Monocytes Absolute: 0.6 10*3/uL (ref 0.1–1.0)
Monocytes Relative: 11.9 % (ref 3.0–12.0)
Neutro Abs: 2.3 10*3/uL (ref 1.4–7.7)
Neutrophils Relative %: 43.3 % (ref 43.0–77.0)
Platelets: 179 10*3/uL (ref 150.0–400.0)
RBC: 4.86 Mil/uL (ref 4.22–5.81)
RDW: 12.9 % (ref 11.5–14.6)
WBC: 5.2 10*3/uL (ref 4.5–10.5)

## 2013-12-08 LAB — HEPATIC FUNCTION PANEL
ALBUMIN: 3.7 g/dL (ref 3.5–5.2)
ALT: 24 U/L (ref 0–53)
AST: 25 U/L (ref 0–37)
Alkaline Phosphatase: 46 U/L (ref 39–117)
BILIRUBIN TOTAL: 1.1 mg/dL (ref 0.3–1.2)
Bilirubin, Direct: 0.1 mg/dL (ref 0.0–0.3)
Total Protein: 6.7 g/dL (ref 6.0–8.3)

## 2013-12-08 LAB — LIPID PANEL
Cholesterol: 136 mg/dL (ref 0–200)
HDL: 40.3 mg/dL (ref >39.00)
LDL Cholesterol: 46 mg/dL (ref 0–99)
Total CHOL/HDL Ratio: 3
Triglycerides: 251 mg/dL — ABNORMAL HIGH (ref 0.0–149.0)
VLDL: 50.2 mg/dL — AB (ref 0.0–40.0)

## 2013-12-08 LAB — BASIC METABOLIC PANEL
BUN: 17 mg/dL (ref 6–23)
CHLORIDE: 104 meq/L (ref 96–112)
CO2: 30 mEq/L (ref 19–32)
Calcium: 9.2 mg/dL (ref 8.4–10.5)
Creatinine, Ser: 1.1 mg/dL (ref 0.4–1.5)
GFR: 67.07 mL/min (ref >60.00)
Glucose, Bld: 139 mg/dL — ABNORMAL HIGH (ref 70–99)
POTASSIUM: 4.4 meq/L (ref 3.5–5.1)
SODIUM: 138 meq/L (ref 135–145)

## 2013-12-08 LAB — HEMOGLOBIN A1C: HEMOGLOBIN A1C: 7.2 % — AB (ref 4.6–6.5)

## 2013-12-15 ENCOUNTER — Encounter: Payer: Self-pay | Admitting: Internal Medicine

## 2013-12-15 ENCOUNTER — Ambulatory Visit (INDEPENDENT_AMBULATORY_CARE_PROVIDER_SITE_OTHER): Payer: Medicare Other | Admitting: Internal Medicine

## 2013-12-15 ENCOUNTER — Other Ambulatory Visit: Payer: Self-pay | Admitting: Internal Medicine

## 2013-12-15 ENCOUNTER — Ambulatory Visit (INDEPENDENT_AMBULATORY_CARE_PROVIDER_SITE_OTHER)
Admission: RE | Admit: 2013-12-15 | Discharge: 2013-12-15 | Disposition: A | Discharge status: Home / Self Care | Payer: Medicare Other | Source: Ambulatory Visit | Attending: Internal Medicine | Admitting: Internal Medicine

## 2013-12-15 VITALS — BP 130/70 | HR 65 | Temp 98.3°F | Ht 67.0 in | Wt 185.2 lb

## 2013-12-15 DIAGNOSIS — E7800 Pure hypercholesterolemia, unspecified: Secondary | ICD-10-CM

## 2013-12-15 DIAGNOSIS — I10 Essential (primary) hypertension: Secondary | ICD-10-CM

## 2013-12-15 DIAGNOSIS — I2510 Atherosclerotic heart disease of native coronary artery without angina pectoris: Secondary | ICD-10-CM

## 2013-12-15 DIAGNOSIS — R05 Cough: Secondary | ICD-10-CM

## 2013-12-15 DIAGNOSIS — E119 Type 2 diabetes mellitus without complications: Secondary | ICD-10-CM

## 2013-12-15 DIAGNOSIS — R059 Cough, unspecified: Secondary | ICD-10-CM

## 2013-12-15 DIAGNOSIS — Z125 Encounter for screening for malignant neoplasm of prostate: Secondary | ICD-10-CM

## 2013-12-15 DIAGNOSIS — G43909 Migraine, unspecified, not intractable, without status migrainosus: Secondary | ICD-10-CM

## 2013-12-15 NOTE — Assessment & Plan Note (Signed)
On Vytorin.  Low cholesterol diet and exercise.  Follow lipid panel and liver function.

## 2013-12-15 NOTE — Assessment & Plan Note (Signed)
Blood pressure as outlined.  On my recheck 146/72.  Outside checks under good control.  Same medication regimen.  Follow.  Follow metabolic panel.

## 2013-12-15 NOTE — Progress Notes (Signed)
Pre-visit discussion using our clinic review tool. No additional management support is needed unless otherwise documented below in the visit note.  

## 2013-12-15 NOTE — Progress Notes (Signed)
Subjective:    Patient ID: Kyle Richardson, male    DOB: December 31, 1927, 80 y.o.   MRN: [id number]  HPI 81 year old male with past history of CAD s/p bypass graft x 4 (followed by Dr Saralyn Pilar), hypertension, hyperglycemia and hypercholesterolemia who comes in today to follow up on these issues as well as for a complete physical exam.  States he has been doing well.  No chest pain or tightness.  Breathing stable.  Sees Dr Saralyn Pilar.  Stress test was ok.  Overall he feels he is doing well from a cardiac standpoint.  States his blood pressures have been doing well.  Eating and drinking well.  Not as active and not watching his diet as much.  Last a1c 7.2.  Bowels stable.   Off aggrenox now.  Only taking aspirin.  Did report that he noticed some throat congestion this am.  Some cough.  Was dark mucus.  No BRB.  No sob and no chest pain.  Some nasal congestion and has noticed some blood tinged nasal mucus.      Past Medical History  Diagnosis Date  . 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  s/p PTCA of the mid RCA 1/96, also  s/p  CABG x 4 7/96  . Hypertension   . Hypercholesterolemia   . Degenerative disc disease   . Migraine headache     history of  . Sleep apnea   . Diabetes mellitus   . Osteoarthritis   . Glaucoma     Current Outpatient Prescriptions on File Prior to Visit  Medication Sig Dispense Refill  . aspirin 325 MG tablet Take 325 mg by mouth daily.      . Calcium Carbonate-Vitamin D (CALTRATE 600+D) 600-400 MG-UNIT per chew tablet Chew 1 tablet by mouth daily.      Marland Kitchen ezetimibe-simvastatin (VYTORIN) 10-40 MG per tablet Take 1 tablet by mouth at bedtime.  30 tablet  5  . fluticasone (FLONASE) 50 MCG/ACT nasal spray TWO PUFFS IN EACH NOSTRIL ONCE A DAY  16 g  4  . latanoprost (XALATAN) 0.005 % ophthalmic solution Place 1 drop into both eyes at bedtime.      . Multiple Vitamins-Minerals (PRESERVISION AREDS 2) CAPS Take by mouth daily.      .  multivitamin-iron-minerals-folic acid (CENTRUM) chewable tablet Chew 1 tablet by mouth daily.      . nitroGLYCERIN (NITROSTAT) 0.4 MG SL tablet Place 0.4 mg under the tongue every 5 (five) minutes as needed. As directed       No current facility-administered medications on file prior to visit.    Review of Systems Patient denies any significant headache, lightheadedness or dizziness.   Some nasal congestion - occasional blood tinge. No sinus pressure.   No chest pain, tightness or palpitations.  No shortness of breath.  Cough as outlined.   No nausea or vomiting.   No abdominal pain or cramping.  No acid reflux.  No bowel change, such as diarrhea, constipation, BRBPR or melana.  No urine change.  Doing well now.  Overall he feels things are stable and feels he is doing well.  Some leg aching.  Only occurs if he stands in the same place for a long period of time.  No back or hip pain.       Objective:   Physical Exam  Filed Vitals:   12/15/13 0937  BP: 130/70  Pulse: 65  Temp: 98.3 F (36.8 C)  80 year old male in no acute distress.  HEENT:  Nares - clear.  Oropharynx - without lesions. NECK:  Supple.  Nontender.  No audible carotid bruit.  HEART:  Appears to be regular.   LUNGS:  No crackles or wheezing audible.  Respirations even and unlabored.   RADIAL PULSE:  Equal bilaterally.  ABDOMEN:  Soft.  Nontender.  Bowel sounds present and normal.  No audible abdominal bruit.  GU:  Declined.   EXTREMITIES:  No increased edema present.  DP pulses palpable and equal bilaterally.            Assessment & Plan:  LEFT LEG NUMBNESS.  Evaluated.  Improved.  Follow.  Not an issue now.     GU.  Saw Dr Amalia Hailey - urologist.  S/p passage of kidney stone.  Doing better.  No reoccurring problems.    MSK.  S/p surgery left third finger.  Continue to follow up with Dr Rudene Christians.  Previous left hand discomfort has improved.    GI.  Colonoscopy 01/30/12 revealed diverticulosis, redundant colon, one 90mm polyp  in the transverse colon and internal hemorrhoids.  Currently asymptomatic.   PULMONARY.  Some cough and throat congestion as outlined.  Colored mucus.  Check cxr.  Saline nasal spray as directed.  Continue flonase.  Notify me if any worsening symptoms or persistent symptoms or problems.    HEALTH MAINTENANCE.  Physical today.   Colonoscopy as outlined.  Last PSA - .78 (01/02/13).  Recheck with next labs.

## 2013-12-15 NOTE — Assessment & Plan Note (Signed)
A1c just checked 7.2.  Discussed with him today.  Discussed the need for diet and exercise.  Follow.  States he is up to date with eye checks.  Sees Dr  Dawna Part.

## 2013-12-15 NOTE — Assessment & Plan Note (Signed)
Doing well.  Continue risk factor modification.  Continue follow up with Dr Paraschos.  Recent stress test negative.   

## 2013-12-15 NOTE — Assessment & Plan Note (Signed)
Has had some episodes of migraines.  Typical of his migraines.  Worked up by neurology.  Stable.  Not an issue now.    

## 2013-12-17 NOTE — Telephone Encounter (Signed)
Unread mychart mailed to patient

## 2014-02-15 LAB — BASIC METABOLIC PANEL
Anion Gap: 4 — ABNORMAL LOW (ref 7–16)
BUN: 14 mg/dL (ref 7–18)
CALCIUM: 8.9 mg/dL (ref 8.5–10.1)
CHLORIDE: 105 mmol/L (ref 98–107)
Co2: 29 mmol/L (ref 21–32)
Creatinine: 0.9 mg/dL (ref 0.60–1.30)
EGFR (African American): >60
EGFR (Non-African Amer.): >60
GLUCOSE: 125 mg/dL — AB (ref 65–99)
Osmolality: 278 (ref 275–301)
Potassium: 4 mmol/L (ref 3.5–5.1)
Sodium: 138 mmol/L (ref 136–145)

## 2014-02-15 LAB — HEPATIC FUNCTION PANEL A (ARMC)
ALT: 26 U/L (ref 12–78)
Albumin: 3.4 g/dL (ref 3.4–5.0)
Alkaline Phosphatase: 64 U/L
Bilirubin, Direct: 0.2 mg/dL (ref 0.00–0.20)
Bilirubin,Total: 0.9 mg/dL (ref 0.2–1.0)
SGOT(AST): 25 U/L (ref 15–37)
Total Protein: 7.1 g/dL (ref 6.4–8.2)

## 2014-02-15 LAB — CBC
HCT: 48.7 % (ref 40.0–52.0)
HGB: 15.8 g/dL (ref 13.0–18.0)
MCH: 31.6 pg (ref 26.0–34.0)
MCHC: 32.4 g/dL (ref 32.0–36.0)
MCV: 97 fL (ref 80–100)
Platelet: 171 10*3/uL (ref 150–440)
RBC: 4.99 10*6/uL (ref 4.40–5.90)
RDW: 12.8 % (ref 11.5–14.5)
WBC: 9.9 10*3/uL (ref 3.8–10.6)

## 2014-02-15 LAB — LIPASE, BLOOD: Lipase: 170 U/L (ref 73–393)

## 2014-02-15 LAB — TROPONIN I: Troponin-I: <0.02 ng/mL

## 2014-02-16 ENCOUNTER — Inpatient Hospital Stay: Payer: Self-pay | Admitting: Surgery

## 2014-02-16 LAB — TROPONIN I

## 2014-02-17 LAB — CBC WITH DIFFERENTIAL/PLATELET
Basophil #: 0 10*3/uL (ref 0.0–0.1)
Basophil %: 0.1 %
Eosinophil #: 0 10*3/uL (ref 0.0–0.7)
Eosinophil %: 0 %
HCT: 49.7 % (ref 40.0–52.0)
HGB: 16.4 g/dL (ref 13.0–18.0)
Lymphocyte #: 0.9 10*3/uL — ABNORMAL LOW (ref 1.0–3.6)
Lymphocyte %: 6.2 %
MCH: 32.5 pg (ref 26.0–34.0)
MCHC: 33 g/dL (ref 32.0–36.0)
MCV: 99 fL (ref 80–100)
MONO ABS: 1.2 x10 3/mm — AB (ref 0.2–1.0)
Monocyte %: 7.9 %
NEUTROS ABS: 12.6 10*3/uL — AB (ref 1.4–6.5)
NEUTROS PCT: 85.8 %
PLATELETS: 171 10*3/uL (ref 150–440)
RBC: 5.05 10*6/uL (ref 4.40–5.90)
RDW: 13.5 % (ref 11.5–14.5)
WBC: 14.7 10*3/uL — ABNORMAL HIGH (ref 3.8–10.6)

## 2014-02-17 LAB — COMPREHENSIVE METABOLIC PANEL
ALBUMIN: 2.8 g/dL — AB (ref 3.4–5.0)
ALK PHOS: 112 U/L
Anion Gap: 8 (ref 7–16)
BUN: 25 mg/dL — ABNORMAL HIGH (ref 7–18)
Bilirubin,Total: 1.8 mg/dL — ABNORMAL HIGH (ref 0.2–1.0)
Calcium, Total: 8.6 mg/dL (ref 8.5–10.1)
Chloride: 106 mmol/L (ref 98–107)
Co2: 24 mmol/L (ref 21–32)
Creatinine: 1.53 mg/dL — ABNORMAL HIGH (ref 0.60–1.30)
EGFR (African American): 48 — ABNORMAL LOW
EGFR (Non-African Amer.): 41 — ABNORMAL LOW
Glucose: 163 mg/dL — ABNORMAL HIGH (ref 65–99)
Osmolality: 284 (ref 275–301)
POTASSIUM: 4.9 mmol/L (ref 3.5–5.1)
SGOT(AST): 194 U/L — ABNORMAL HIGH (ref 15–37)
SGPT (ALT): 207 U/L — ABNORMAL HIGH (ref 12–78)
Sodium: 138 mmol/L (ref 136–145)
Total Protein: 6.6 g/dL (ref 6.4–8.2)

## 2014-02-17 LAB — AMYLASE: Amylase: 10 U/L — ABNORMAL LOW (ref 25–115)

## 2014-02-17 LAB — MAGNESIUM: Magnesium: 2.1 mg/dL

## 2014-02-17 LAB — PROTIME-INR
INR: 1.4
Prothrombin Time: 16.5 secs — ABNORMAL HIGH (ref 11.5–14.7)

## 2014-02-17 LAB — APTT: Activated PTT: 36.5 secs — ABNORMAL HIGH (ref 23.6–35.9)

## 2014-02-18 LAB — CBC WITH DIFFERENTIAL/PLATELET
Basophil #: 0 10*3/uL (ref 0.0–0.1)
Basophil %: 0.1 %
EOS PCT: 0 %
Eosinophil #: 0 10*3/uL (ref 0.0–0.7)
HCT: 43.8 % (ref 40.0–52.0)
HGB: 14.3 g/dL (ref 13.0–18.0)
Lymphocyte #: 0.7 10*3/uL — ABNORMAL LOW (ref 1.0–3.6)
Lymphocyte %: 6.4 %
MCH: 32.3 pg (ref 26.0–34.0)
MCHC: 32.6 g/dL (ref 32.0–36.0)
MCV: 99 fL (ref 80–100)
MONO ABS: 0.9 x10 3/mm (ref 0.2–1.0)
MONOS PCT: 8.2 %
Neutrophil #: 9.1 10*3/uL — ABNORMAL HIGH (ref 1.4–6.5)
Neutrophil %: 85.3 %
Platelet: 183 10*3/uL (ref 150–440)
RBC: 4.42 10*6/uL (ref 4.40–5.90)
RDW: 13.3 % (ref 11.5–14.5)
WBC: 10.7 10*3/uL — ABNORMAL HIGH (ref 3.8–10.6)

## 2014-02-18 LAB — BASIC METABOLIC PANEL
ANION GAP: 5 — AB (ref 7–16)
BUN: 32 mg/dL — ABNORMAL HIGH (ref 7–18)
Calcium, Total: 8.6 mg/dL (ref 8.5–10.1)
Chloride: 108 mmol/L — ABNORMAL HIGH (ref 98–107)
Co2: 24 mmol/L (ref 21–32)
Creatinine: 1.41 mg/dL — ABNORMAL HIGH (ref 0.60–1.30)
EGFR (African American): 53 — ABNORMAL LOW
GFR CALC NON AF AMER: 45 — AB
GLUCOSE: 141 mg/dL — AB (ref 65–99)
Osmolality: 283 (ref 275–301)
Potassium: 5 mmol/L (ref 3.5–5.1)
Sodium: 137 mmol/L (ref 136–145)

## 2014-02-18 LAB — HEPATIC FUNCTION PANEL A (ARMC)
ALK PHOS: 101 U/L
AST: 122 U/L — AB (ref 15–37)
Albumin: 2.5 g/dL — ABNORMAL LOW (ref 3.4–5.0)
BILIRUBIN DIRECT: 0.4 mg/dL — AB (ref 0.00–0.20)
Bilirubin,Total: 1.1 mg/dL — ABNORMAL HIGH (ref 0.2–1.0)
SGPT (ALT): 141 U/L — ABNORMAL HIGH (ref 12–78)
Total Protein: 6.2 g/dL — ABNORMAL LOW (ref 6.4–8.2)

## 2014-02-18 LAB — LIPASE, BLOOD: Lipase: 51 U/L — ABNORMAL LOW (ref 73–393)

## 2014-02-18 LAB — PATHOLOGY REPORT

## 2014-02-19 LAB — COMPREHENSIVE METABOLIC PANEL
ALK PHOS: 76 U/L
Albumin: 2.1 g/dL — ABNORMAL LOW (ref 3.4–5.0)
Anion Gap: 5 — ABNORMAL LOW (ref 7–16)
BUN: 18 mg/dL (ref 7–18)
Bilirubin,Total: 1 mg/dL (ref 0.2–1.0)
CALCIUM: 8.3 mg/dL — AB (ref 8.5–10.1)
Chloride: 104 mmol/L (ref 98–107)
Co2: 27 mmol/L (ref 21–32)
Creatinine: 1.05 mg/dL (ref 0.60–1.30)
EGFR (African American): >60
Glucose: 123 mg/dL — ABNORMAL HIGH (ref 65–99)
OSMOLALITY: 275 (ref 275–301)
Potassium: 4.1 mmol/L (ref 3.5–5.1)
SGOT(AST): 61 U/L — ABNORMAL HIGH (ref 15–37)
SGPT (ALT): 83 U/L — ABNORMAL HIGH (ref 12–78)
Sodium: 136 mmol/L (ref 136–145)
TOTAL PROTEIN: 5.8 g/dL — AB (ref 6.4–8.2)

## 2014-02-19 LAB — CBC WITH DIFFERENTIAL/PLATELET
Basophil #: 0 10*3/uL (ref 0.0–0.1)
Basophil %: 0.1 %
EOS PCT: 0.4 %
Eosinophil #: 0 10*3/uL (ref 0.0–0.7)
HCT: 34.9 % — ABNORMAL LOW (ref 40.0–52.0)
HGB: 11.8 g/dL — ABNORMAL LOW (ref 13.0–18.0)
LYMPHS ABS: 0.7 10*3/uL — AB (ref 1.0–3.6)
Lymphocyte %: 12.7 %
MCH: 33 pg (ref 26.0–34.0)
MCHC: 33.8 g/dL (ref 32.0–36.0)
MCV: 98 fL (ref 80–100)
MONOS PCT: 9.3 %
Monocyte #: 0.5 x10 3/mm (ref 0.2–1.0)
Neutrophil #: 4.5 10*3/uL (ref 1.4–6.5)
Neutrophil %: 77.5 %
Platelet: 150 10*3/uL (ref 150–440)
RBC: 3.58 10*6/uL — ABNORMAL LOW (ref 4.40–5.90)
RDW: 13.1 % (ref 11.5–14.5)
WBC: 5.9 10*3/uL (ref 3.8–10.6)

## 2014-02-20 LAB — CBC WITH DIFFERENTIAL/PLATELET
BASOS ABS: 0 10*3/uL (ref 0.0–0.1)
BASOS PCT: 0.3 %
Eosinophil #: 0.1 10*3/uL (ref 0.0–0.7)
Eosinophil %: 1.6 %
HCT: 36.1 % — ABNORMAL LOW (ref 40.0–52.0)
HGB: 12.3 g/dL — ABNORMAL LOW (ref 13.0–18.0)
LYMPHS PCT: 14.6 %
Lymphocyte #: 0.8 10*3/uL — ABNORMAL LOW (ref 1.0–3.6)
MCH: 32.8 pg (ref 26.0–34.0)
MCHC: 33.9 g/dL (ref 32.0–36.0)
MCV: 97 fL (ref 80–100)
Monocyte #: 0.7 x10 3/mm (ref 0.2–1.0)
Monocyte %: 11.3 %
NEUTROS ABS: 4.2 10*3/uL (ref 1.4–6.5)
NEUTROS PCT: 72.2 %
Platelet: 176 10*3/uL (ref 150–440)
RBC: 3.73 10*6/uL — ABNORMAL LOW (ref 4.40–5.90)
RDW: 13 % (ref 11.5–14.5)
WBC: 5.8 10*3/uL (ref 3.8–10.6)

## 2014-02-20 LAB — BASIC METABOLIC PANEL
Anion Gap: 7 (ref 7–16)
BUN: 13 mg/dL (ref 7–18)
CALCIUM: 8.6 mg/dL (ref 8.5–10.1)
CO2: 26 mmol/L (ref 21–32)
Chloride: 104 mmol/L (ref 98–107)
Creatinine: 0.92 mg/dL (ref 0.60–1.30)
EGFR (Non-African Amer.): >60
Glucose: 149 mg/dL — ABNORMAL HIGH (ref 65–99)
Osmolality: 277 (ref 275–301)
Potassium: 3.4 mmol/L — ABNORMAL LOW (ref 3.5–5.1)
SODIUM: 137 mmol/L (ref 136–145)

## 2014-02-26 ENCOUNTER — Other Ambulatory Visit: Payer: Self-pay | Admitting: Internal Medicine

## 2014-03-04 ENCOUNTER — Telehealth: Payer: Self-pay | Admitting: Internal Medicine

## 2014-03-04 NOTE — Telephone Encounter (Signed)
Records placed in your folder for review.

## 2014-03-04 NOTE — Telephone Encounter (Signed)
Records requested from [facility]

## 2014-03-04 NOTE — Telephone Encounter (Signed)
Needing a hospital follow up appointment. °

## 2014-03-05 NOTE — Telephone Encounter (Signed)
Please call him.  I do not mind seeing him, but I reviewed his hospital records and he had his gallbladder removed.  They had recommended a f/u with his surgeon in 3-5 days (on records).  Just trying to save him two visits.  Please confirm he is doing ok.

## 2014-03-05 NOTE — Telephone Encounter (Signed)
Left message for pt to return my call.

## 2014-03-05 NOTE — Telephone Encounter (Signed)
The patient returned Nurse Nixon's call. He is doing well he has followed up with his surgeon and he has another appointment on Monday June 1st for an additional follow up.

## 2014-03-23 ENCOUNTER — Emergency Department: Payer: Self-pay | Admitting: Emergency Medicine

## 2014-03-23 LAB — COMPREHENSIVE METABOLIC PANEL
ALBUMIN: 3.3 g/dL — AB (ref 3.4–5.0)
ALT: 46 U/L (ref 12–78)
AST: 68 U/L — AB (ref 15–37)
Alkaline Phosphatase: 89 U/L
Anion Gap: 9 (ref 7–16)
BILIRUBIN TOTAL: 0.5 mg/dL (ref 0.2–1.0)
BUN: 13 mg/dL (ref 7–18)
Calcium, Total: 8.7 mg/dL (ref 8.5–10.1)
Chloride: 104 mmol/L (ref 98–107)
Co2: 27 mmol/L (ref 21–32)
Creatinine: 0.97 mg/dL (ref 0.60–1.30)
EGFR (Non-African Amer.): >60
Glucose: 171 mg/dL — ABNORMAL HIGH (ref 65–99)
OSMOLALITY: 284 (ref 275–301)
Potassium: 3.8 mmol/L (ref 3.5–5.1)
SODIUM: 140 mmol/L (ref 136–145)
Total Protein: 6.5 g/dL (ref 6.4–8.2)

## 2014-03-23 LAB — CBC
HCT: 40.9 % (ref 40.0–52.0)
HGB: 13.6 g/dL (ref 13.0–18.0)
MCH: 32.5 pg (ref 26.0–34.0)
MCHC: 33.4 g/dL (ref 32.0–36.0)
MCV: 97 fL (ref 80–100)
PLATELETS: 182 10*3/uL (ref 150–440)
RBC: 4.2 10*6/uL — ABNORMAL LOW (ref 4.40–5.90)
RDW: 13.5 % (ref 11.5–14.5)
WBC: 5.2 10*3/uL (ref 3.8–10.6)

## 2014-03-23 LAB — TROPONIN I
Troponin-I: <0.02 ng/mL
Troponin-I: <0.02 ng/mL

## 2014-03-23 LAB — LIPASE, BLOOD: LIPASE: 288 U/L (ref 73–393)

## 2014-03-24 ENCOUNTER — Encounter: Payer: Self-pay | Admitting: Internal Medicine

## 2014-03-25 ENCOUNTER — Telehealth: Payer: Self-pay | Admitting: Internal Medicine

## 2014-03-25 DIAGNOSIS — K2970 Gastritis, unspecified, without bleeding: Secondary | ICD-10-CM

## 2014-03-25 DIAGNOSIS — R109 Unspecified abdominal pain: Secondary | ICD-10-CM

## 2014-03-25 NOTE — Telephone Encounter (Signed)
Sent pt a my chart message in response to his my chart message.  Order placed for GI referal.

## 2014-03-27 ENCOUNTER — Telehealth: Payer: Self-pay | Admitting: *Deleted

## 2014-03-27 ENCOUNTER — Ambulatory Visit: Payer: Self-pay | Admitting: Gastroenterology

## 2014-03-27 NOTE — Telephone Encounter (Signed)
Noted  

## 2014-03-27 NOTE — Telephone Encounter (Addendum)
Called to follow up on patient's abdominal pain. Spoke with wife, he was seen at [facility] yesterday, is currently having an ultrasound done for elevated liver enzymes, ?Sludge and may have further procedure to remove. She will keep [place] updated. And she states she had read the [employer], just shows unread to our office.

## 2014-04-17 ENCOUNTER — Encounter: Payer: Self-pay | Admitting: Internal Medicine

## 2014-04-21 ENCOUNTER — Other Ambulatory Visit (INDEPENDENT_AMBULATORY_CARE_PROVIDER_SITE_OTHER): Payer: Medicare Other

## 2014-04-21 DIAGNOSIS — E119 Type 2 diabetes mellitus without complications: Secondary | ICD-10-CM

## 2014-04-21 DIAGNOSIS — E7800 Pure hypercholesterolemia, unspecified: Secondary | ICD-10-CM

## 2014-04-21 DIAGNOSIS — Z125 Encounter for screening for malignant neoplasm of prostate: Secondary | ICD-10-CM

## 2014-04-21 LAB — BASIC METABOLIC PANEL
BUN: 13 mg/dL (ref 6–23)
CALCIUM: 8.9 mg/dL (ref 8.4–10.5)
CHLORIDE: 106 meq/L (ref 96–112)
CO2: 26 meq/L (ref 19–32)
CREATININE: 0.9 mg/dL (ref 0.4–1.5)
GFR: 91.18 mL/min (ref >60.00)
Glucose, Bld: 133 mg/dL — ABNORMAL HIGH (ref 70–99)
Potassium: 4.1 mEq/L (ref 3.5–5.1)
Sodium: 138 mEq/L (ref 135–145)

## 2014-04-21 LAB — LIPID PANEL
CHOLESTEROL: 128 mg/dL (ref 0–200)
HDL: 34.1 mg/dL — ABNORMAL LOW (ref >39.00)
LDL Cholesterol: 17 mg/dL (ref 0–99)
NONHDL: 93.9
Total CHOL/HDL Ratio: 4
Triglycerides: 386 mg/dL — ABNORMAL HIGH (ref 0.0–149.0)
VLDL: 77.2 mg/dL — AB (ref 0.0–40.0)

## 2014-04-21 LAB — HEPATIC FUNCTION PANEL
ALK PHOS: 69 U/L (ref 39–117)
ALT: 26 U/L (ref 0–53)
AST: 27 U/L (ref 0–37)
Albumin: 3.5 g/dL (ref 3.5–5.2)
BILIRUBIN TOTAL: 0.6 mg/dL (ref 0.2–1.2)
Bilirubin, Direct: 0.1 mg/dL (ref 0.0–0.3)
Total Protein: 5.9 g/dL — ABNORMAL LOW (ref 6.0–8.3)

## 2014-04-21 LAB — HEMOGLOBIN A1C: Hgb A1c MFr Bld: 6.7 % — ABNORMAL HIGH (ref 4.6–6.5)

## 2014-04-21 LAB — TSH: TSH: 2.7 u[IU]/mL (ref 0.35–4.50)

## 2014-04-21 LAB — PSA, MEDICARE: PSA: 0.45 ng/ml (ref 0.10–4.00)

## 2014-04-21 LAB — MICROALBUMIN / CREATININE URINE RATIO
Creatinine,U: 162.8 mg/dL
MICROALB/CREAT RATIO: 1.8 mg/g (ref 0.0–30.0)
Microalb, Ur: 2.9 mg/dL — ABNORMAL HIGH (ref 0.0–1.9)

## 2014-04-22 ENCOUNTER — Ambulatory Visit: Payer: Self-pay | Admitting: Gastroenterology

## 2014-04-22 ENCOUNTER — Encounter: Payer: Self-pay | Admitting: Internal Medicine

## 2014-04-24 ENCOUNTER — Ambulatory Visit: Payer: Medicare Other | Admitting: Internal Medicine

## 2014-05-08 ENCOUNTER — Ambulatory Visit: Payer: Medicare Other | Admitting: Internal Medicine

## 2014-05-19 ENCOUNTER — Encounter: Payer: Self-pay | Admitting: Internal Medicine

## 2014-06-18 DIAGNOSIS — G4730 Sleep apnea, unspecified: Secondary | ICD-10-CM | POA: Insufficient documentation

## 2014-07-03 ENCOUNTER — Encounter: Payer: Self-pay | Admitting: Internal Medicine

## 2014-07-03 ENCOUNTER — Ambulatory Visit (INDEPENDENT_AMBULATORY_CARE_PROVIDER_SITE_OTHER): Payer: Medicare Other | Admitting: Internal Medicine

## 2014-07-03 VITALS — BP 114/70 | HR 61 | Temp 97.9°F | Resp 14 | Wt 175.5 lb

## 2014-07-03 DIAGNOSIS — G43809 Other migraine, not intractable, without status migrainosus: Secondary | ICD-10-CM

## 2014-07-03 DIAGNOSIS — Z23 Encounter for immunization: Secondary | ICD-10-CM

## 2014-07-03 DIAGNOSIS — I10 Essential (primary) hypertension: Secondary | ICD-10-CM

## 2014-07-03 DIAGNOSIS — I2510 Atherosclerotic heart disease of native coronary artery without angina pectoris: Secondary | ICD-10-CM

## 2014-07-03 DIAGNOSIS — E119 Type 2 diabetes mellitus without complications: Secondary | ICD-10-CM

## 2014-07-03 DIAGNOSIS — F439 Reaction to severe stress, unspecified: Secondary | ICD-10-CM

## 2014-07-03 DIAGNOSIS — E7800 Pure hypercholesterolemia, unspecified: Secondary | ICD-10-CM

## 2014-07-03 DIAGNOSIS — Z733 Stress, not elsewhere classified: Secondary | ICD-10-CM

## 2014-07-03 NOTE — Progress Notes (Signed)
Pre visit review using our clinic review tool, if applicable. No additional management support is needed unless otherwise documented below in the visit note. 

## 2014-07-05 ENCOUNTER — Encounter: Payer: Self-pay | Admitting: Internal Medicine

## 2014-07-05 DIAGNOSIS — F439 Reaction to severe stress, unspecified: Secondary | ICD-10-CM | POA: Insufficient documentation

## 2014-07-05 NOTE — Assessment & Plan Note (Signed)
Has had some episodes of migraines.  Typical of his migraines.  Worked up by neurology.  Stable.  Not an issue now.

## 2014-07-05 NOTE — Assessment & Plan Note (Signed)
Blood pressure as outlined.  On my recheck 144/72.  Outside checks under good control.  Same medication regimen.  Follow.  Follow metabolic panel.  Increased stress may be causing some increase.  Follow.

## 2014-07-05 NOTE — Assessment & Plan Note (Signed)
Doing well.  Continue risk factor modification.  Continue follow up with Dr Saralyn Pilar.  Recent stress test negative.

## 2014-07-05 NOTE — Assessment & Plan Note (Signed)
Increased stress with his son's issues.  Discussed at length with him today.  Discussed counseling and discussed treatment.  He wants to think about this.  Will let me know what he decides.  Follow.

## 2014-07-05 NOTE — Progress Notes (Signed)
Subjective:    Patient ID: Kyle Richardson, male    DOB: [birth year], 80 y.o.   MRN: [id number]  HPI 80 year old male with past history of CAD s/p bypass graft x 4 (followed by Dr Saralyn Pilar), hypertension, hyperglycemia and hypercholesterolemia who comes in today for a scheduled follow up.  States he has been doing relatively well.  No chest pain or tightness.  Breathing stable.  Sees Dr Saralyn Pilar.  Stress test was ok.  Overall he feels he is doing well from a cardiac standpoint.  Eating and drinking well.  Not as active and not watching his diet as much.  Last a1c 7.2.  We discussed diet and exercise today.  Bowels stable.   Off aggrenox now.  Only taking aspirin. Doing well on aspirin.  Increased stress with his son's issues.  We discussed this at length.  Discussed counseling and medications.      Past Medical History  Diagnosis Date  . 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  s/p PTCA of the mid RCA 1/96, also  s/p  CABG x 4 7/96  . Hypertension   . Hypercholesterolemia   . Degenerative disc disease   . Migraine headache     history of  . Sleep apnea   . Diabetes mellitus   . Osteoarthritis   . Glaucoma     Current Outpatient Prescriptions on File Prior to Visit  Medication Sig Dispense Refill  . ACCU-CHEK SOFTCLIX LANCETS lancets USE AS DIRECTED  100 each  1  . aspirin 325 MG tablet Take 325 mg by mouth daily.      . Calcium Carbonate-Vitamin D (CALTRATE 600+D) 600-400 MG-UNIT per chew tablet Chew 1 tablet by mouth daily.      Marland Kitchen ezetimibe-simvastatin (VYTORIN) 10-40 MG per tablet Take 1 tablet by mouth at bedtime.  30 tablet  5  . fluticasone (FLONASE) 50 MCG/ACT nasal spray TWO PUFFS IN EACH NOSTRIL ONCE A DAY  16 g  4  . glucose blood (ACCU-CHEK AVIVA PLUS) test strip Check blood sugar once daily. Dx 250.00  50 each  3  . latanoprost (XALATAN) 0.005 % ophthalmic solution Place 1 drop into both eyes at bedtime.      . Multiple Vitamins-Minerals  (PRESERVISION AREDS 2) CAPS Take by mouth daily.      . multivitamin-iron-minerals-folic acid (CENTRUM) chewable tablet Chew 1 tablet by mouth daily.      . nitroGLYCERIN (NITROSTAT) 0.4 MG SL tablet Place 0.4 mg under the tongue every 5 (five) minutes as needed. As directed       No current facility-administered medications on file prior to visit.    Review of Systems Patient denies any significant headache, lightheadedness or dizziness.   No nasal congestion.  No sinus pressure.   No chest pain, tightness or palpitations.  No shortness of breath.  No cough or congestion.   No nausea or vomiting.   No abdominal pain or cramping.  No acid reflux.  No bowel change, such as diarrhea, constipation, BRBPR or melana.  No urine change.  On aspirin and doing well on this medication.  Increased stress as outlined.       Objective:   Physical Exam  Filed Vitals:   07/03/14 0953  BP: 114/70  Pulse: 61  Temp: 97.9 F (36.6 C)  Resp: 14   Blood pressure recheck:  60/108  80 year old male in no acute distress.  HEENT:  Nares -  clear.  Oropharynx - without lesions. NECK:  Supple.  Nontender.  No audible carotid bruit.  HEART:  Appears to be regular.   LUNGS:  No crackles or wheezing audible.  Respirations even and unlabored.   RADIAL PULSE:  Equal bilaterally.  ABDOMEN:  Soft.  Nontender.  Bowel sounds present and normal.  No audible abdominal bruit.  EXTREMITIES:  No increased edema present.  DP pulses palpable and equal bilaterally.            Assessment & Plan:  LEFT LEG NUMBNESS.  Evaluated.  Improved.  Follow.  Not an issue now.     GU.  Saw Dr Amalia Hailey - urologist.  S/p passage of kidney stone.  Doing better.  No reoccurring problems.    MSK.  S/p surgery left third finger.  Continue to follow up with Dr Rudene Christians.  Previous left hand discomfort has improved.    GI.  Colonoscopy 01/30/12 revealed diverticulosis, redundant colon, one 54mm polyp in the transverse colon and internal hemorrhoids.   Currently asymptomatic.   HEALTH MAINTENANCE.  Physical 12/15/13.   Colonoscopy as outlined.  Last PSA - .45 (04/21/14).   I spent 25 minutes with the patient and more than 50% of the time was spent in consultation regarding the above.

## 2014-07-05 NOTE — Assessment & Plan Note (Signed)
A1c last checked 7.2. Again discussed the need for diet and exercise.  Follow.  States he is up to date with eye checks.  Sees Dr  Dawna Part.

## 2014-07-05 NOTE — Assessment & Plan Note (Signed)
On Vytorin.  Low cholesterol diet and exercise.  Follow lipid panel and liver function.  Last LDL low.  Discussed changing his cholesterol medication.  Will consider changing after next lab check.

## 2014-07-08 ENCOUNTER — Encounter: Payer: Self-pay | Admitting: Internal Medicine

## 2014-07-08 ENCOUNTER — Telehealth: Payer: Self-pay | Admitting: Internal Medicine

## 2014-07-08 MED ORDER — SERTRALINE HCL 50 MG PO TABS: ORAL_TABLET | ORAL | Status: DC

## 2014-07-08 NOTE — Telephone Encounter (Signed)
See last office note.  Pt came in with his wife to her visit.  Informed me he was agreeable to start on medication to help with stress.  zoloft sent in to be taken as outlined.  Also number given to [employer] .

## 2014-07-09 ENCOUNTER — Encounter: Payer: Self-pay | Admitting: Internal Medicine

## 2014-07-10 ENCOUNTER — Telehealth: Payer: Self-pay

## 2014-07-10 ENCOUNTER — Encounter: Payer: Self-pay | Admitting: Internal Medicine

## 2014-07-10 NOTE — Telephone Encounter (Signed)
The patient is hoping to talk to you about his medication.  He states he believes he missed your call earlier.

## 2014-07-10 NOTE — Telephone Encounter (Signed)
Pt returned call & states that vision problems last around 5 minutes at a time. It starts at the lower left side and then goes up. Looks wavy & jagged. Have had about 5 episodes since 11am. Also notices a slight headache (h/o migraines). Pt states that he will d/c medication & call back on Monday with an update. Also confirmed that if sx's persist or worsen, he would be seen sooner rather than later.

## 2014-07-12 ENCOUNTER — Encounter: Payer: Self-pay | Admitting: Internal Medicine

## 2014-07-17 ENCOUNTER — Encounter: Payer: Self-pay | Admitting: Internal Medicine

## 2014-07-20 MED ORDER — FLUOXETINE HCL 10 MG PO CAPS: 10.0000 mg | ORAL_CAPSULE | Freq: Every day | ORAL | Status: DC

## 2014-07-20 NOTE — Telephone Encounter (Signed)
Prescription sent in for prozac 10mg  #30 with one refill.  Pt notified via my chart.

## 2014-07-24 ENCOUNTER — Other Ambulatory Visit (INDEPENDENT_AMBULATORY_CARE_PROVIDER_SITE_OTHER): Payer: Medicare Other

## 2014-07-24 DIAGNOSIS — E7800 Pure hypercholesterolemia, unspecified: Secondary | ICD-10-CM

## 2014-07-24 DIAGNOSIS — E119 Type 2 diabetes mellitus without complications: Secondary | ICD-10-CM

## 2014-07-24 LAB — LIPID PANEL
CHOL/HDL RATIO: 4
Cholesterol: 128 mg/dL (ref 0–200)
HDL: 32.5 mg/dL — ABNORMAL LOW (ref >39.00)
LDL Cholesterol: 61 mg/dL (ref 0–99)
NONHDL: 95.5
Triglycerides: 173 mg/dL — ABNORMAL HIGH (ref 0.0–149.0)
VLDL: 34.6 mg/dL (ref 0.0–40.0)

## 2014-07-24 LAB — BASIC METABOLIC PANEL
BUN: 17 mg/dL (ref 6–23)
CO2: 22 meq/L (ref 19–32)
CREATININE: 1 mg/dL (ref 0.4–1.5)
Calcium: 9.1 mg/dL (ref 8.4–10.5)
Chloride: 105 mEq/L (ref 96–112)
GFR: 72.19 mL/min (ref >60.00)
GLUCOSE: 124 mg/dL — AB (ref 70–99)
Potassium: 4.3 mEq/L (ref 3.5–5.1)
Sodium: 138 mEq/L (ref 135–145)

## 2014-07-24 LAB — HEPATIC FUNCTION PANEL
ALK PHOS: 54 U/L (ref 39–117)
ALT: 24 U/L (ref 0–53)
AST: 28 U/L (ref 0–37)
Albumin: 3.2 g/dL — ABNORMAL LOW (ref 3.5–5.2)
BILIRUBIN TOTAL: 1.2 mg/dL (ref 0.2–1.2)
Bilirubin, Direct: 0.1 mg/dL (ref 0.0–0.3)
Total Protein: 6.7 g/dL (ref 6.0–8.3)

## 2014-07-24 LAB — HEMOGLOBIN A1C: Hgb A1c MFr Bld: 6.8 % — ABNORMAL HIGH (ref 4.6–6.5)

## 2014-07-25 ENCOUNTER — Encounter: Payer: Self-pay | Admitting: Internal Medicine

## 2014-07-27 ENCOUNTER — Telehealth: Payer: Self-pay | Admitting: Internal Medicine

## 2014-07-27 MED ORDER — EZETIMIBE-SIMVASTATIN 10-20 MG PO TABS: 1.0000 | ORAL_TABLET | Freq: Every day | ORAL | Status: DC

## 2014-07-27 NOTE — Telephone Encounter (Signed)
pts cholesterol low.  Will change vytorin to 10/20.  rx sent in for #30 with 5 refills.

## 2014-09-07 ENCOUNTER — Encounter: Payer: Self-pay | Admitting: Internal Medicine

## 2014-09-08 MED ORDER — ATORVASTATIN CALCIUM 20 MG PO TABS: 20.0000 mg | ORAL_TABLET | Freq: Every day | ORAL | Status: DC

## 2014-09-08 NOTE — Telephone Encounter (Signed)
rx sent in for lipitor 20mg  q day.  Pt notified via my chart.

## 2014-09-10 NOTE — Telephone Encounter (Signed)
Unread mychart message mailed to patient 

## 2014-09-15 ENCOUNTER — Ambulatory Visit (INDEPENDENT_AMBULATORY_CARE_PROVIDER_SITE_OTHER): Payer: Medicare Other | Admitting: Internal Medicine

## 2014-09-15 ENCOUNTER — Encounter: Payer: Self-pay | Admitting: Internal Medicine

## 2014-09-15 VITALS — BP 120/60 | HR 63 | Temp 98.1°F | Ht 67.0 in | Wt 175.0 lb

## 2014-09-15 DIAGNOSIS — Z658 Other specified problems related to psychosocial circumstances: Secondary | ICD-10-CM

## 2014-09-15 DIAGNOSIS — I10 Essential (primary) hypertension: Secondary | ICD-10-CM

## 2014-09-15 DIAGNOSIS — F439 Reaction to severe stress, unspecified: Secondary | ICD-10-CM

## 2014-09-15 DIAGNOSIS — E119 Type 2 diabetes mellitus without complications: Secondary | ICD-10-CM

## 2014-09-15 DIAGNOSIS — E7800 Pure hypercholesterolemia, unspecified: Secondary | ICD-10-CM

## 2014-09-15 DIAGNOSIS — M6530 Trigger finger, unspecified finger: Secondary | ICD-10-CM

## 2014-09-15 DIAGNOSIS — I2510 Atherosclerotic heart disease of native coronary artery without angina pectoris: Secondary | ICD-10-CM

## 2014-09-15 NOTE — Progress Notes (Signed)
Pre visit review using our clinic review tool, if applicable. No additional management support is needed unless otherwise documented below in the visit note. 

## 2014-09-20 ENCOUNTER — Encounter: Payer: Self-pay | Admitting: Internal Medicine

## 2014-09-20 NOTE — Progress Notes (Signed)
Subjective:    Patient ID: Kyle Richardson, male    DOB: 1928/06/12, 80 y.o.   MRN: [id number]  HPI 80 year old male with past history of CAD s/p bypass graft x 4 (followed by Dr Saralyn Pilar), hypertension, hyperglycemia and hypercholesterolemia who comes in today for a scheduled follow up.  States he has been doing relatively well.  No chest pain or tightness.  Breathing stable.  Sees Dr Saralyn Pilar.  Stress test was ok.  Overall he feels he is doing well from a cardiac standpoint.  Eating and drinking well.   Trying to do better watching his diet.  We discussed diet and exercise today. Bowels stable.   Off aggrenox now.  Only taking aspirin. Doing well on aspirin.  Increased stress with his son's issues.   On prozac now.  Feels better.  Having problems with left third finger - trigger finger.  Persistent problem.      Past Medical History  Diagnosis Date  . 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  s/p PTCA of the mid RCA 1/96, also  s/p  CABG x 4 7/96  . Hypertension   . Hypercholesterolemia   . Degenerative disc disease   . Migraine headache     history of  . Sleep apnea   . Diabetes mellitus   . Osteoarthritis   . Glaucoma     Current Outpatient Prescriptions on File Prior to Visit  Medication Sig Dispense Refill  . ACCU-CHEK SOFTCLIX LANCETS lancets USE AS DIRECTED 100 each 1  . aspirin 325 MG tablet Take 325 mg by mouth daily.    Marland Kitchen atorvastatin (LIPITOR) 20 MG tablet Take 1 tablet (20 mg total) by mouth daily. 30 tablet 3  . Calcium Carbonate-Vitamin D (CALTRATE 600+D) 600-400 MG-UNIT per chew tablet Chew 1 tablet by mouth daily.    Marland Kitchen FLUoxetine (PROZAC) 10 MG capsule Take 1 capsule (10 mg total) by mouth daily. 30 capsule 1  . fluticasone (FLONASE) 50 MCG/ACT nasal spray TWO PUFFS IN EACH NOSTRIL ONCE A DAY 16 g 4  . glucose blood (ACCU-CHEK AVIVA PLUS) test strip Check blood sugar once daily. Dx 250.00 50 each 3  . latanoprost (XALATAN) 0.005 %  ophthalmic solution Place 1 drop into both eyes at bedtime.    . Multiple Vitamins-Minerals (PRESERVISION AREDS 2) CAPS Take by mouth daily.    . multivitamin-iron-minerals-folic acid (CENTRUM) chewable tablet Chew 1 tablet by mouth daily.    . nitroGLYCERIN (NITROSTAT) 0.4 MG SL tablet Place 0.4 mg under the tongue every 5 (five) minutes as needed. As directed     No current facility-administered medications on file prior to visit.    Review of Systems Patient denies any headache, lightheadedness or dizziness.   No nasal congestion.  No sinus pressure.   No chest pain, tightness or palpitations.  No shortness of breath.  No cough or congestion.   No nausea or vomiting.   No abdominal pain or cramping.  No acid reflux.  No bowel change, such as diarrhea, constipation, BRBPR or melana.  No urine change.  On aspirin and doing well on this medication.  Increased stress as outlined. Doing better on prozac.       Objective:   Physical Exam  Filed Vitals:   09/15/14 1022  BP: 120/60  Pulse: 63  Temp: 98.1 F (36.7 C)   Blood pressure recheck:  42/66  80 year old male in no acute distress.  HEENT:  Nares - clear.  Oropharynx - without lesions. NECK:  Supple.  Nontender.  No audible carotid bruit.  HEART:  Appears to be regular.   LUNGS:  No crackles or wheezing audible.  Respirations even and unlabored.   RADIAL PULSE:  Equal bilaterally.  ABDOMEN:  Soft.  Nontender.  Bowel sounds present and normal.  No audible abdominal bruit.  EXTREMITIES:  No increased edema present.             Assessment & Plan:  Trigger finger of left hand Persistent problem.  Refer to Dr Jefm Bryant.   - Ambulatory referral to Rheumatology  Essential hypertension Blood pressure as outlined.  Same medication regimen.  Follow.   Coronary artery disease involving native coronary artery of native heart without angina pectoris Stable.  Continue risk factor modifications.  Continue f/u with cardiology.    Type 2  diabetes mellitus without complication Low carb diet and exercise.  Follow.   Lab Results  Component Value Date   HGBA1C 6.8* 07/24/2014   Hypercholesterolemia Low cholesterol diet and exercise.  Change vytorin to lipitor.  Follow cholesterol.    Stress Increased stress as outlined.  On prozac.  Doing better.  Follow.   GU.  Saw Dr Amalia Hailey - urologist.  S/p passage of kidney stone.  Doing better.  No reoccurring problems.    MSK.  S/p surgery left third finger.  Continue to follow up with Dr Rudene Christians.  Having increased problems now.  Discussed further evaluation.  Wants to see Dr Jefm Bryant.    GI.  Colonoscopy 01/30/12 revealed diverticulosis, redundant colon, one 66mm polyp in the transverse colon and internal hemorrhoids.  Currently asymptomatic.   HEALTH MAINTENANCE.  Physical 12/15/13.   Colonoscopy as outlined.  Last PSA - .45 (04/21/14).   I spent 25 minutes with the patient and more than 50% of the time was spent in consultation regarding the above.

## 2014-09-22 LAB — HM DIABETES EYE EXAM

## 2014-09-25 ENCOUNTER — Other Ambulatory Visit: Payer: Self-pay | Admitting: Internal Medicine

## 2014-09-28 ENCOUNTER — Encounter: Payer: Self-pay | Admitting: Internal Medicine

## 2014-10-12 NOTE — Telephone Encounter (Signed)
[place] RESPONSE MAILED TO PATIENT

## 2014-10-14 ENCOUNTER — Encounter: Payer: Self-pay | Admitting: *Deleted

## 2014-10-29 ENCOUNTER — Encounter: Payer: Self-pay | Admitting: Internal Medicine

## 2014-11-05 ENCOUNTER — Encounter: Payer: Self-pay | Admitting: Internal Medicine

## 2014-11-09 NOTE — Telephone Encounter (Signed)
Unread mychart message mailed to patient 

## 2014-12-14 ENCOUNTER — Encounter: Payer: Self-pay | Admitting: Internal Medicine

## 2014-12-14 ENCOUNTER — Other Ambulatory Visit (INDEPENDENT_AMBULATORY_CARE_PROVIDER_SITE_OTHER): Payer: Medicare Other

## 2014-12-14 DIAGNOSIS — E7800 Pure hypercholesterolemia, unspecified: Secondary | ICD-10-CM

## 2014-12-14 DIAGNOSIS — E119 Type 2 diabetes mellitus without complications: Secondary | ICD-10-CM

## 2014-12-14 DIAGNOSIS — I10 Essential (primary) hypertension: Secondary | ICD-10-CM

## 2014-12-14 DIAGNOSIS — I2510 Atherosclerotic heart disease of native coronary artery without angina pectoris: Secondary | ICD-10-CM

## 2014-12-14 LAB — BASIC METABOLIC PANEL
BUN: 11 mg/dL (ref 6–23)
CO2: 31 meq/L (ref 19–32)
CREATININE: 0.95 mg/dL (ref 0.40–1.50)
Calcium: 9.2 mg/dL (ref 8.4–10.5)
Chloride: 102 mEq/L (ref 96–112)
GFR: 80.07 mL/min (ref >60.00)
GLUCOSE: 124 mg/dL — AB (ref 70–99)
Potassium: 4.5 mEq/L (ref 3.5–5.1)
SODIUM: 136 meq/L (ref 135–145)

## 2014-12-14 LAB — CBC WITH DIFFERENTIAL/PLATELET
Basophils Absolute: 0 10*3/uL (ref 0.0–0.1)
Basophils Relative: 0.4 % (ref 0.0–3.0)
Eosinophils Absolute: 0.2 10*3/uL (ref 0.0–0.7)
Eosinophils Relative: 4.1 % (ref 0.0–5.0)
HCT: 44.7 % (ref 39.0–52.0)
HEMOGLOBIN: 15.5 g/dL (ref 13.0–17.0)
Lymphocytes Relative: 36.6 % (ref 12.0–46.0)
Lymphs Abs: 1.9 10*3/uL (ref 0.7–4.0)
MCHC: 34.7 g/dL (ref 30.0–36.0)
MCV: 93.8 fl (ref 78.0–100.0)
Monocytes Absolute: 0.6 10*3/uL (ref 0.1–1.0)
Monocytes Relative: 12.1 % — ABNORMAL HIGH (ref 3.0–12.0)
NEUTROS ABS: 2.4 10*3/uL (ref 1.4–7.7)
Neutrophils Relative %: 46.8 % (ref 43.0–77.0)
Platelets: 196 10*3/uL (ref 150.0–400.0)
RBC: 4.77 Mil/uL (ref 4.22–5.81)
RDW: 13.1 % (ref 11.5–15.5)
WBC: 5.1 10*3/uL (ref 4.0–10.5)

## 2014-12-14 LAB — HEPATIC FUNCTION PANEL
ALK PHOS: 68 U/L (ref 39–117)
ALT: 27 U/L (ref 0–53)
AST: 28 U/L (ref 0–37)
Albumin: 3.8 g/dL (ref 3.5–5.2)
BILIRUBIN DIRECT: 0.2 mg/dL (ref 0.0–0.3)
Total Bilirubin: 1 mg/dL (ref 0.2–1.2)
Total Protein: 6.2 g/dL (ref 6.0–8.3)

## 2014-12-14 LAB — LDL CHOLESTEROL, DIRECT: Direct LDL: 78 mg/dL

## 2014-12-14 LAB — LIPID PANEL
CHOL/HDL RATIO: 5
CHOLESTEROL: 171 mg/dL (ref 0–200)
HDL: 38 mg/dL — ABNORMAL LOW (ref >39.00)
NONHDL: 133
Triglycerides: 279 mg/dL — ABNORMAL HIGH (ref 0.0–149.0)
VLDL: 55.8 mg/dL — ABNORMAL HIGH (ref 0.0–40.0)

## 2014-12-14 LAB — HEMOGLOBIN A1C: HEMOGLOBIN A1C: 6.9 % — AB (ref 4.6–6.5)

## 2014-12-17 ENCOUNTER — Encounter: Payer: Self-pay | Admitting: Internal Medicine

## 2014-12-17 ENCOUNTER — Ambulatory Visit (INDEPENDENT_AMBULATORY_CARE_PROVIDER_SITE_OTHER): Payer: Medicare Other | Admitting: Internal Medicine

## 2014-12-17 VITALS — BP 120/58 | HR 69 | Temp 98.2°F | Ht 67.0 in | Wt 178.4 lb

## 2014-12-17 DIAGNOSIS — E7800 Pure hypercholesterolemia, unspecified: Secondary | ICD-10-CM

## 2014-12-17 DIAGNOSIS — Z658 Other specified problems related to psychosocial circumstances: Secondary | ICD-10-CM

## 2014-12-17 DIAGNOSIS — I10 Essential (primary) hypertension: Secondary | ICD-10-CM

## 2014-12-17 DIAGNOSIS — F439 Reaction to severe stress, unspecified: Secondary | ICD-10-CM

## 2014-12-17 DIAGNOSIS — Z0000 Encounter for general adult medical examination without abnormal findings: Secondary | ICD-10-CM

## 2014-12-17 DIAGNOSIS — Z125 Encounter for screening for malignant neoplasm of prostate: Secondary | ICD-10-CM

## 2014-12-17 DIAGNOSIS — E119 Type 2 diabetes mellitus without complications: Secondary | ICD-10-CM

## 2014-12-17 DIAGNOSIS — I2510 Atherosclerotic heart disease of native coronary artery without angina pectoris: Secondary | ICD-10-CM

## 2014-12-17 NOTE — Progress Notes (Signed)
Pre visit review using our clinic review tool, if applicable. No additional management support is needed unless otherwise documented below in the visit note. 

## 2014-12-17 NOTE — Progress Notes (Signed)
Patient ID: Kyle Richardson, male   DOB: 1928-05-21, 81 y.o.   MRN: [id number]   Subjective:    Patient ID: Kyle Richardson, male    DOB: Feb 27, 1928, 81 y.o.   MRN: [id number]  HPI  Patient here for his physical.  States he is doing well.  Saw cardiology recently.  Planning for stress test next week.  Breathing stable.  Trying to watch what he is eating.  States am sugars averaging 140.  No bowel issues.  Handling stress relatively well.     Past Medical History  Diagnosis Date  . 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  s/p PTCA of the mid RCA 1/96, also  s/p  CABG x 4 7/96  . Hypertension   . Hypercholesterolemia   . Degenerative disc disease   . Migraine headache     history of  . Sleep apnea   . Diabetes mellitus   . Osteoarthritis   . Glaucoma      Current Outpatient Prescriptions on File Prior to Visit  Medication Sig Dispense Refill  . ACCU-CHEK SOFTCLIX LANCETS lancets USE AS DIRECTED 100 each 1  . aspirin 325 MG tablet Take 325 mg by mouth daily.    Marland Kitchen atorvastatin (LIPITOR) 20 MG tablet Take 1 tablet (20 mg total) by mouth daily. 30 tablet 3  . fluticasone (FLONASE) 50 MCG/ACT nasal spray TWO PUFFS IN EACH NOSTRIL ONCE A DAY 16 g 4  . glucose blood (ACCU-CHEK AVIVA PLUS) test strip Check blood sugar once daily. Dx 250.00 50 each 3  . latanoprost (XALATAN) 0.005 % ophthalmic solution Place 1 drop into both eyes at bedtime.    . Multiple Vitamins-Minerals (PRESERVISION AREDS 2) CAPS Take by mouth daily.    . nitroGLYCERIN (NITROSTAT) 0.4 MG SL tablet Place 0.4 mg under the tongue every 5 (five) minutes as needed. As directed     No current facility-administered medications on file prior to visit.    Review of Systems  Constitutional: Negative for appetite change and unexpected weight change.  HENT: Negative for congestion and sinus pressure.   Eyes: Negative for pain and visual disturbance.  Respiratory: Negative for cough, chest  tightness and shortness of breath.   Cardiovascular: Negative for chest pain, palpitations and leg swelling.  Gastrointestinal: Negative for nausea, vomiting, abdominal pain and diarrhea.  Genitourinary: Negative for frequency and difficulty urinating.  Musculoskeletal: Negative for back pain and joint swelling.  Skin: Negative for color change and rash.  Neurological: Negative for dizziness, light-headedness and headaches.  Hematological: Negative for adenopathy. Does not bruise/bleed easily.  Psychiatric/Behavioral: Negative for decreased concentration and agitation.       Objective:     Pulse 68  Physical Exam  Constitutional: He is oriented to person, place, and time. He appears well-developed and well-nourished. No distress.  HENT:  Head: Normocephalic and atraumatic.  Nose: Nose normal.  Mouth/Throat: Oropharynx is clear and moist. No oropharyngeal exudate.  Eyes: Conjunctivae are normal. Right eye exhibits no discharge. Left eye exhibits no discharge.  Neck: Neck supple. No thyromegaly present.  Cardiovascular: Normal rate and regular rhythm.   Pulmonary/Chest: Breath sounds normal. No respiratory distress. He has no wheezes.  Abdominal: Soft. Bowel sounds are normal. There is no tenderness.  Genitourinary:  Rectal exam revealed no palpable prostate nodule.  Heme negative.    Musculoskeletal: He exhibits no edema or tenderness.  Lymphadenopathy:    He has no cervical adenopathy.  Neurological: He is  alert and oriented to person, place, and time.  Skin: Skin is warm and dry. No rash noted.  Psychiatric: He has a normal mood and affect. His behavior is normal.    BP 120/58 mmHg  Pulse 69  Temp(Src) 98.2 F (36.8 C) (Oral)  Ht '5\' 7"'  (1.702 m)  Wt 178 lb 6 oz (80.91 kg)  BMI 27.93 kg/m2  SpO2 95% Wt Readings from Last 3 Encounters:  12/17/14 178 lb 6 oz (80.91 kg)  09/15/14 175 lb (79.379 kg)  07/03/14 175 lb 8 oz (79.606 kg)     Lab Results  Component Value  Date   WBC 5.1 12/14/2014   HGB 15.5 12/14/2014   HCT 44.7 12/14/2014   PLT 196.0 12/14/2014   GLUCOSE 124* 12/14/2014   CHOL 171 12/14/2014   TRIG 279.0* 12/14/2014   HDL 38.00* 12/14/2014   LDLDIRECT 78.0 12/14/2014   LDLCALC 61 07/24/2014   ALT 27 12/14/2014   AST 28 12/14/2014   NA 136 12/14/2014   K 4.5 12/14/2014   CL 102 12/14/2014   CREATININE 0.95 12/14/2014   BUN 11 12/14/2014   CO2 31 12/14/2014   TSH 2.70 04/21/2014   PSA 0.45 04/21/2014   INR 1.0 09/25/2012   HGBA1C 6.9* 12/14/2014   MICROALBUR 2.9* 04/21/2014    Dg Chest 2 View  12/15/2013   CLINICAL DATA:  Cough and congestion  EXAM: CHEST  2 VIEW  COMPARISON:  None.  FINDINGS: The patient is status post prior median sternotomy and CABG. The heart size is normal. The aorta is torturous. Both lungs are clear. The visualized skeletal structures are unremarkable.  IMPRESSION: No active cardiopulmonary disease.   Electronically Signed   By: Abelardo Diesel M.D.   On: 12/15/2013 13:50       Assessment & Plan:   Problem List Items Addressed This Visit    CAD (coronary artery disease)    Seeing cardiology.  Planning for stress test next week.  Continue risk factor modification.        Diabetes mellitus    Low carb diet and exercise.  Follow met b and a1c.        Relevant Orders   Hemoglobin A1c   Microalbumin / creatinine urine ratio   Health care maintenance    Physical 12/17/14.  PSA .45 04/21/14.  Colonoscopy 01/30/12 - 60m transverse polyp, diverticulosis, redundant colon and thrombosed internal hemorrhoids.        Hypercholesterolemia    Low cholesterol diet and exercise.  On atorvastatin.  Follow lipid panel and liver function tests.        Relevant Orders   Lipid panel   Hepatic function panel   Hypertension - Primary    Blood pressure doing well.  Follow pressures.  Continue same medication regimen.        Relevant Orders   Basic metabolic panel   Stress    Doing better.  Feels handling things  relatively well.  Follow.        Relevant Orders   TSH    Other Visit Diagnoses    Prostate cancer screening        Relevant Orders    PSA, Medicare      I spent 25 minutes with the patient and more than 50% of the time was spent in consultation regarding the above.     SEinar Pheasant MD

## 2014-12-23 ENCOUNTER — Other Ambulatory Visit: Payer: Self-pay | Admitting: Internal Medicine

## 2014-12-23 ENCOUNTER — Other Ambulatory Visit: Payer: Self-pay | Admitting: *Deleted

## 2014-12-26 ENCOUNTER — Encounter: Payer: Self-pay | Admitting: Internal Medicine

## 2014-12-26 DIAGNOSIS — Z0000 Encounter for general adult medical examination without abnormal findings: Secondary | ICD-10-CM | POA: Insufficient documentation

## 2014-12-26 NOTE — Assessment & Plan Note (Signed)
Blood pressure doing well.  Follow pressures.  Continue same medication regimen.

## 2014-12-26 NOTE — Assessment & Plan Note (Signed)
Seeing cardiology.  Planning for stress test next week.  Continue risk factor modification.

## 2014-12-26 NOTE — Assessment & Plan Note (Signed)
Low carb diet and exercise.  Follow met b and a1c.   

## 2014-12-26 NOTE — Assessment & Plan Note (Signed)
Low cholesterol diet and exercise.  On atorvastatin.  Follow lipid panel and liver function tests.   

## 2014-12-26 NOTE — Assessment & Plan Note (Signed)
Doing better.  Feels handling things relatively well.  Follow.

## 2014-12-26 NOTE — Assessment & Plan Note (Signed)
Physical 12/17/14.  PSA .45 04/21/14.  Colonoscopy 01/30/12 - 55mm transverse polyp, diverticulosis, redundant colon and thrombosed internal hemorrhoids.

## 2015-01-05 ENCOUNTER — Ambulatory Visit (INDEPENDENT_AMBULATORY_CARE_PROVIDER_SITE_OTHER): Payer: Medicare Other | Admitting: *Deleted

## 2015-01-05 DIAGNOSIS — Z23 Encounter for immunization: Secondary | ICD-10-CM

## 2015-01-21 ENCOUNTER — Other Ambulatory Visit: Payer: Self-pay | Admitting: Internal Medicine

## 2015-01-30 NOTE — Op Note (Signed)
PATIENT NAME:  Kyle Richardson, Kyle Richardson MR#:  [id number] DATE OF BIRTH:  28-Aug-1928  DATE OF PROCEDURE:  02/17/2014  PREOPERATIVE DIAGNOSIS: Acute cholecystitis.   POSTOPERATIVE DIAGNOSIS: Acute gangrenous cholecystitis.   SURGERY: Laparoscopy with conversion to open cholecystectomy.   SURGEON: Rodena Goldmann, M.D.   ASSISTANTHorald Pollen, [place] student  ANESTHESIA: General.   OPERATIVE PROCEDURE: With the patient in the supine position after the induction of appropriate general anesthesia, the patient was placed in the head down, feet up position. The patient's abdomen was prepped with ChloraPrep and draped with sterile towels. A supraumbilical incision was made in the standard fashion, carried down bluntly through subcutaneous tissue. The patient had a small umbilical hernia and that site was used for access. Port was inserted without difficulty. The abdomen was then insufflated. The patient was placed in the head up, feet down position and rotated slightly to the left side. The omentum and right colon were stuck up over the gallbladder. A longitudinal midepigastric incision at the site of the previous surgery was reopened and an 11 mm port was inserted under direct vision. Two lateral ports, 5 mm in size, were inserted under direct vision. The omentum was pulled off the gallbladder. The gallbladder was markedly gangrenous and necrotic. It was markedly distended. 70 mL of dark bile were removed. I could not manipulate the gallbladder. I was able to elevate it above the liver but could not adequately see the porta hepatis. I added a Peer retractor and a 5 mm port and an angled scope and still with concentrated dissection was unable to really establish any visualization at the porta hepatis. I decided at that point to convert to an open procedure. The abdomen was desufflated. The ports were withdrawn. New instruments were brought to the table and instruments counted. The 2 upper incisions were extended to a single  incision in the subcostal area and carried down through the subcutaneous tissue with Bovie electrocautery. The anterior fascia was divided with the Bovie and the muscle was divided with the Bovie. The epigastric vessels were individually ligated with 3-0 Vicryl and the posterior fascia opened into the peritoneal cavity. The gallbladder was markedly distended and necrotic. I could not identify anything in the porta hepatis so I took the gallbladder down from above separating a plane from the liver without difficulty. I was able to identify what appeared to be the cystic artery. It was doubly clipped and divided. After tedious dissection it was very difficult to identify anything in the porta hepatis. I was able to identify what I thought was the end of the gallbladder and the cystic duct. Because there was so much inflammation in the area, I brought a TX-30 stapling device to the table with a blue load and divided the cystic duct with that device. That maneuver provided better exposure and the gallbladder was removed. The area was then copiously irrigated. A drain was inserted through a separate stab wound into the bed of the liver. After proper irrigation a sheet of Avitene was placed in the bed of the liver. The drain was secured with 3-0 nylon. Posterior fascia was closed with a running suture of 0 Maxon, anterior fascia closed with interrupted figure-of-eight sutures of 0 Maxon. A drain was placed in the intramuscular space because of all of the biliary contamination. The drain was secured with clips and the anterior skin was closed with clips. The area was infiltrated with 0.25% Marcaine for postoperative pain control. The umbilical hernia was closed with figure-of-eight sutures  of 0 Vicryl and the skin clipped. The wounds were dressed sterilely. The patient was returned to the recovery room having tolerated the procedure well. Sponge, instrument and needle counts were correct x2 in the operating room.   ____________________________ Rodena Goldmann III, MD rle:sb D: 02/17/2014 13:31:33 ET T: 02/17/2014 13:55:22 ET JOB#: [id number]  cc: Rodena Goldmann III, MD, <Dictator> Einar Pheasant, MD Rodena Goldmann MD ELECTRONICALLY SIGNED 02/25/2014 0:09

## 2015-01-30 NOTE — H&P (Signed)
History of Present Illness 66 yowm with 36 hours of relatively constant, worsening, diffuse abdominal pain, associated with 1 episode of nausea, no vomiting. Denies fever, chills, change in his skin or eye color, or stool or urine color.   Past History IMI and CABG 1996 DYslipidemia OSA DDD Migraine H/As s/p Appendectomy s/p Hernia repair   Past Med/Surgical Hx:  hyperlipidemia:   MI:   CABG:   ALLERGIES:  Flomax: Chest Pain  Codeine: Unknown  Sulfa drugs: Other  HOME MEDICATIONS: Medication Instructions Status  aspirin 325 mg oral tablet 1 tab(s) orally once a day Active  Vytorin 10 mg-40 mg oral tablet 1 tab(s) orally once a day (at bedtime) Active  calcium carbonate 1000 mg oral tablet, chewable 1 tab(s) orally once a day Active  multivitamin 1 tab(s) orally once a day Active  Nasonex 50 mcg/inh nasal spray 2 spray(s) nasal once a day Active  latanoprost ophthalmic 0.005% ophthalmic solution 1 drop(s) to each eye once a day Active  Allergy Relief (Fexofenadine HCl) 180 mg oral tablet 1 tab(s) orally once a day Active  PreserVision AREDS 2 Antioxidant Multiple Vitamins and Minerals oral capsule 1 cap(s) orally 2 times a day Active   Family and Social History:  Family History Non-Contributory   Social History negative tobacco, negative ETOH, married, lives with his wife and D/A son; retired Armed forces technical officer of [employer]   Review of Systems:  Fever/Chills No   Cough No   Sputum No   Abdominal Pain Yes   Diarrhea No   Constipation No   Nausea/Vomiting Yes   SOB/DOE No   Chest Pain No   Dysuria No   Tolerating PT Yes   Tolerating Diet No  Nauseated   Medications/Allergies Reviewed Medications/Allergies reviewed   Physical Exam:  GEN well developed, well nourished   HEENT pink conjunctivae, PERRL, moist oral mucosa, Oropharynx clear, hearing loss   NECK supple  No masses  trachea midline   RESP normal resp effort  clear BS  no use of  accessory muscles   CARD regular rate  no murmur  no JVD  no Rub   ABD positive tenderness  distended / protuberant, no typmany, tender without guarding, RUQ worse, but also LUQ and RLQ   LYMPH negative neck   EXTR negative cyanosis/clubbing, negative edema   SKIN normal to palpation, No rashes, skin turgor good   NEURO cranial nerves intact, negative tremor, follows commands, motor/sensory function intact   PSYCH alert, A+O to time, place, person   Lab Results: Hepatic:  10-May-15 18:34   Bilirubin, Total 0.9  Bilirubin, Direct 0.2 (Result(s) reported on 15 Feb 2014 at 08:04PM.)  Alkaline Phosphatase 64 (45-117 NOTE: New Reference Range 08/29/13)  SGPT (ALT) 26  SGOT (AST) 25  Total Protein, Serum 7.1  Albumin, Serum 3.4  Routine Chem:  10-May-15 18:34   Lipase 170 (Result(s) reported on 15 Feb 2014 at 07:39PM.)  Glucose, Serum  125  BUN 14  Creatinine (comp) 0.90  Sodium, Serum 138  Potassium, Serum 4.0  Chloride, Serum 105  CO2, Serum 29  Calcium (Total), Serum 8.9  Anion Gap  4  Osmolality (calc) 278  eGFR (African American) >60  eGFR (Non-African American) >60 (eGFR values <39m/min/1.73 m2 may be an indication of chronic kidney disease (CKD). Calculated eGFR is useful in patients with stable renal function. The eGFR calculation will not be reliable in acutely ill patients when serum creatinine is changing rapidly. It is not useful  in  patients on dialysis. The eGFR calculation may not be applicable to patients at the low and high extremes of body sizes, pregnant women, and vegetarians.)  Cardiac:  10-May-15 18:34   Troponin I < 0.02 (0.00-0.05 0.05 ng/mL or less: NEGATIVE  Repeat testing in 3-6 hrs  if clinically indicated. >0.05 ng/mL: POTENTIAL  MYOCARDIAL INJURY. Repeat  testing in 3-6 hrs if  clinically indicated. NOTE: An increase or decrease  of 30% or more on serial  testing suggests a  clinically important change)    23:52   Troponin I <  0.02 (0.00-0.05 0.05 ng/mL or less: NEGATIVE  Repeat testing in 3-6 hrs  if clinically indicated. >0.05 ng/mL: POTENTIAL  MYOCARDIAL INJURY. Repeat  testing in 3-6 hrs if  clinically indicated. NOTE: An increase or decrease  of 30% or more on serial  testing suggests a  clinically important change)  Routine Hem:  10-May-15 18:34   WBC (CBC) 9.9  RBC (CBC) 4.99  Hemoglobin (CBC) 15.8  Hematocrit (CBC) 48.7  Platelet Count (CBC) 171 (Result(s) reported on 15 Feb 2014 at 07:04PM.)  MCV 97  MCH 31.6  MCHC 32.4  RDW 12.8   Radiology Results: [place]:    10-May-15 23:34, US Abdomen Limited Survey  US Abdomen Limited Survey  REASON FOR EXAM:    ct suspect cholecystitis  COMMENTS:   Body Site: Gallbladder, Liver, Common Bile Duct    PROCEDURE: [place]  - US ABDOMEN LIMITED SURVEY  - Feb 15 2014 11:34PM     CLINICAL DATA:  Follow-up for CT findings of cholecystitis.    EXAM:  US ABDOMEN LIMITED - RIGHT UPPER QUADRANT    COMPARISON:  CT ABD-PELV W/ CM dated 02/15/2014    FINDINGS:  Gallbladder:  Gallbladder is distended and diffusely filled with internal debris,  sludge, and small stones. No gallbladder wall thickening. Murphy's  sign is negative the patient has received pain medication, limiting  the validity of the sign. Trace pericholecystic edema. Changes would  be consistent with acute cholecystitis in the appropriate clinical  setting.    Common bile duct:    Diameter: 3.4 mm, normal.  No visualized common duct stones.    Liver:    No focal lesion identified. Within normal limits in parenchymal  echogenicity.   IMPRESSION:  Gallbladder is distended and filled with multiple stones and sludge.  Mild pericholecystic edema. Changes are consistent with acute  cholecystitis in the appropriate clinical setting. No bile duct  dilatation      Electronically Signed    By: Lucienne Capers M.D.    On: 02/15/2014 23:36         Verified By: Neale Burly, M.D.,   [place]:    10-May-15 20:46, CT Abdomen and Pelvis With Contrast  PACS Image    10-May-15 23:34, US Abdomen Limited Survey  PACS Image  CT:    10-May-15 20:46, CT Abdomen and Pelvis With Contrast  CT Abdomen and Pelvis With Contrast  REASON FOR EXAM:    (1) abd pain and distention; (2) abd pain  COMMENTS:       PROCEDURE: CT  - CT ABDOMEN / PELVIS  W  - Feb 15 2014  8:46PM     CLINICAL DATA:  Abdominal pain and distention with nausea. Previous  appendectomy.    EXAM:  CT ABDOMEN AND PELVIS WITH CONTRAST    TECHNIQUE:  Multidetector CT imaging of the abdomen and pelvis was performed  using the standard protocol following bolus administration  of  intravenous contrast.  CONTRAST:  100 mL Isovue-300 IV    COMPARISON:  12/23/2012    FINDINGS:  Lung bases are unremarkable. Is mild calcification of the coronary  arteries. Median sternotomy wires are present.    Abdominal images demonstrate distention of the gallbladder with  evidence of mild cholelithiasis. There is subtle ill definition of  the gallbladder wall likely presenting mild inflammation. There is a  tiny amount of pericholecystic fluid. There is a tiny 2 mm calcific  density over the porta hepatis unchanged. The pancreas, spleen and  adrenal glands are within normal. Liver is unremarkable. Kidneys  normal in size with a 2-3 mm stone over the lower pole collecting  system of the left kidney. There is a 1.2 cm cyst over the mid pole  of the right kidney. There are a couple small sub cm bilateral renal  hypodensities too small to characterize but likely cysts. The  ureters are normal. There is calcified plaque over the abdominal  aorta and iliac arteries. 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    Pelvic images demonstrate the bladder, prostate and rectum to within  normal. There are several phleboliths present. There are  degenerative changes of the spine and hips.     IMPRESSION:  Mild distention of the  gallbladder with evidence of cholelithiasis  and findings suggesting mild inflammatory change adjacent the  gallbladder wall. These findings may be due to acute cholecystitis.  Possible 2 mm stone over the region of the common bile duct.  Recommend right upper quadrant ultrasound for better evaluation.    Nonobstructing 3 mm left renal stone. A few small bilateral renal  cysts.      Electronically Signed    By: Marin Olp M.D.    On: 02/15/2014 20:58         Verified By: Pearletha Alfred, M.D.,    Assessment/Admission Diagnosis Acute cholecystitis or gallbladder hydrops   Plan Adit, IVF, IV ABx Possible lap CCY   Electronic Signatures: Consuela Mimes (MD)  (Signed 11-May-15 00:42)  Authored: CHIEF COMPLAINT and HISTORY, PAST MEDICAL/SURGIAL HISTORY, ALLERGIES, HOME MEDICATIONS, FAMILY AND SOCIAL HISTORY, REVIEW OF SYSTEMS, PHYSICAL EXAM, LABS, Radiology, ASSESSMENT AND PLAN   Last Updated: 11-May-15 00:42 by Consuela Mimes (MD)

## 2015-01-30 NOTE — Discharge Summary (Signed)
PATIENT NAME:  Kyle Richardson, Kyle Richardson MR#:  [id number] DATE OF BIRTH:  16-Jan-1928  DATE OF ADMISSION:  02/16/2014 DATE OF DISCHARGE:  02/21/2014  BRIEF HISTORY: The patient is an 81 year old gentleman, admitted through the Emergency Room with abdominal pain consistent with possible acute cholecystitis. He had multiple underlying medical problems. He had a normal white blood cell count. CT scan revealed a distended gallbladder with multiple stones and sludge, mild pericholecystic fluid, suggesting possible acute cholecystitis. He was admitted to the hospital and placed on IV antibiotics. His symptoms did not improve. After appropriate preoperative preparation, he was taken to surgery on the morning of 02/17/2014. At surgery, laparoscopy was attempted, but his gallbladder demonstrated acute gangrenous cholecystitis with areas of significant necrosis. I could not complete the procedure laparoscopically, and it was converted to an open cholecystectomy. The gallbladder was removed, a drain was placed. He had mild postoperative abdominal discomfort and increasing nausea. He began to finally be able to tolerate liquids on 02/18/2014, advanced to a soft diet by 02/20/2014. He was discharged home on 02/21/2014 to be followed in the office in 7 to 10 days' time. Bathing, activity, and driving instructions were given to the patient.   DISCHARGE MEDICATIONS: Include Vytorin 10 mg/40 mg once a day, calcium 1000 mg once a day, Nasonex 50 mcg b.i.d., latanoprost ophthalmic solution 0.005% once a day bilaterally, aspirin 325 mg p.o. daily. He was discharged home on Vicodin 5/325 for pain.   FINAL DISCHARGE DIAGNOSIS: Acute gangrenous cholecystitis.   SURGERY: Laparoscopy with conversion to open cholecystectomy.     ____________________________ Rodena Goldmann III, MD rle:cg D: 02/25/2014 00:06:43 ET T: 02/25/2014 00:44:56 ET JOB#: [id number]  cc: Rodena Goldmann III, MD, <Dictator> Einar Pheasant, MD Rodena Goldmann  MD ELECTRONICALLY SIGNED 02/25/2014 20:09

## 2015-03-22 ENCOUNTER — Encounter: Payer: Self-pay | Admitting: Internal Medicine

## 2015-03-24 ENCOUNTER — Ambulatory Visit: Payer: Medicare Other | Admitting: Nurse Practitioner

## 2015-03-24 ENCOUNTER — Telehealth: Payer: Self-pay | Admitting: Nurse Practitioner

## 2015-03-24 NOTE — Telephone Encounter (Signed)
Okay to cancel per NP.

## 2015-03-24 NOTE — Telephone Encounter (Signed)
Pt called to cancel appt for today at 3:30 because wife is sick. Please advise

## 2015-04-05 ENCOUNTER — Encounter: Payer: Self-pay | Admitting: Internal Medicine

## 2015-04-05 ENCOUNTER — Ambulatory Visit (INDEPENDENT_AMBULATORY_CARE_PROVIDER_SITE_OTHER): Payer: Medicare Other | Admitting: Internal Medicine

## 2015-04-05 VITALS — BP 122/60 | HR 58 | Temp 97.8°F | Ht 67.0 in | Wt 177.4 lb

## 2015-04-05 DIAGNOSIS — S81819A Laceration without foreign body, unspecified lower leg, initial encounter: Secondary | ICD-10-CM

## 2015-04-05 DIAGNOSIS — T148 Other injury of unspecified body region: Secondary | ICD-10-CM | POA: Diagnosis not present

## 2015-04-05 DIAGNOSIS — T148XXA Other injury of unspecified body region, initial encounter: Secondary | ICD-10-CM | POA: Insufficient documentation

## 2015-04-05 MED ORDER — MUPIROCIN 2 % EX OINT: TOPICAL_OINTMENT | CUTANEOUS | Status: DC

## 2015-04-05 NOTE — Assessment & Plan Note (Signed)
Small raised hematoma - left lower anterior leg.  Minimal tenderness.  Per pt improving.  bactroban topically - to lesion.  Follow.  Should continue to reabsorb.

## 2015-04-05 NOTE — Assessment & Plan Note (Signed)
Small healing lesion on right leg.  bactroban topically.  Follow.  Will check on tetanus.  He thinks he is up to date.

## 2015-04-05 NOTE — Progress Notes (Signed)
Patient ID: Kyle Richardson, male   DOB: 12-Mar-1928, 81 y.o.   MRN: [id number]   Subjective:    Patient ID: Kyle Richardson, male    DOB: [birth year], 81 y.o.   MRN: [id number]  HPI  Patient here as a work in with cuts on his legs.  Has a lesion on his right leg that occurred two weeks ago.  He was cutting dead limbs and scraped his leg.  Was slow to heal.  Does appear to be healing now.  No evidence of infection.  Left leg was injured last week.  A bookcase hit against his leg.  Had a raised "knot" on his leg.  Was bigger.  He iced and elevated his leg.  Is better.  No increase warmth.  Minimal tenderness.     Past Medical History  Diagnosis Date  . 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  s/p PTCA of the mid RCA 1/96, also  s/p  CABG x 4 7/96  . Hypertension   . Hypercholesterolemia   . Degenerative disc disease   . Migraine headache     history of  . Sleep apnea   . Diabetes mellitus   . Osteoarthritis   . Glaucoma     Current Outpatient Prescriptions on File Prior to Visit  Medication Sig Dispense Refill  . ACCU-CHEK SOFTCLIX LANCETS lancets USE AS DIRECTED 100 each 1  . aspirin 325 MG tablet Take 325 mg by mouth daily.    Marland Kitchen atorvastatin (LIPITOR) 20 MG tablet TAKE ONE (1) TABLET EACH DAY 30 tablet 6  . calcium citrate-vitamin D (CITRACAL+D) 315-200 MG-UNIT per tablet Take 1 tablet by mouth daily.    . Chlorpheniramine Maleate (ALLERGY RELIEF PO) Take by mouth daily.    Marland Kitchen FLUoxetine (PROZAC) 10 MG capsule TAKE 1 CAPSULE BY MOUTH EVERY DAY. 30 capsule 2  . fluticasone (FLONASE) 50 MCG/ACT nasal spray TWO PUFFS IN EACH NOSTRIL ONCE A DAY 16 g 4  . glucose blood (ACCU-CHEK AVIVA PLUS) test strip Check blood sugar once daily. Dx 250.00 50 each 3  . latanoprost (XALATAN) 0.005 % ophthalmic solution Place 1 drop into both eyes at bedtime.    . Multiple Vitamin (MULTIVITAMIN) tablet Take 1 tablet by mouth daily.    . Multiple Vitamins-Minerals  (PRESERVISION AREDS 2) CAPS Take by mouth daily.    . nitroGLYCERIN (NITROSTAT) 0.4 MG SL tablet Place 0.4 mg under the tongue every 5 (five) minutes as needed. As directed    . pantoprazole (PROTONIX) 40 MG tablet Take 40 mg by mouth daily.     No current facility-administered medications on file prior to visit.    Review of Systems  Constitutional: Negative for fever and appetite change.  Cardiovascular: Negative for leg swelling.  Gastrointestinal: Negative for nausea.  Musculoskeletal:       No leg or foot pain.   Skin: Negative for color change and rash.       2 lacerations - one on each leg.  Raised lesion - left leg.  Minimal tenderness - adjacent to left leg lesion.        Objective:    Physical Exam  Constitutional: He appears well-developed and well-nourished. No distress.  Cardiovascular: Normal rate and regular rhythm.   Pulmonary/Chest: Effort normal and breath sounds normal. No respiratory distress.  Musculoskeletal: He exhibits no edema.  DP pulses palpable and equal bilaterally.   Skin: Skin is warm and dry. No rash noted. No  erythema.  Healing laceration - right anterior (tib/fib).  Non tender.  Raised hematoma - left anterior lower leg.  Minimal tenderness medial to hematoma.  Minimal healing laceration - center of hematoma.  No increased erythema surrounding.    Psychiatric: He has a normal mood and affect. His behavior is normal.    BP 122/60 mmHg  Pulse 58  Temp(Src) 97.8 F (36.6 C) (Oral)  Ht 5\' 7"  (1.702 m)  Wt 177 lb 6 oz (80.457 kg)  BMI 27.77 kg/m2  SpO2 94% Wt Readings from Last 3 Encounters:  04/05/15 177 lb 6 oz (80.457 kg)  12/17/14 178 lb 6 oz (80.91 kg)  09/15/14 175 lb (79.379 kg)     Lab Results  Component Value Date   WBC 5.1 12/14/2014   HGB 15.5 12/14/2014   HCT 44.7 12/14/2014   PLT 196.0 12/14/2014   GLUCOSE 124* 12/14/2014   CHOL 171 12/14/2014   TRIG 279.0* 12/14/2014   HDL 38.00* 12/14/2014   LDLDIRECT 78.0 12/14/2014    LDLCALC 61 07/24/2014   ALT 27 12/14/2014   AST 28 12/14/2014   NA 136 12/14/2014   K 4.5 12/14/2014   CL 102 12/14/2014   CREATININE 0.95 12/14/2014   BUN 11 12/14/2014   CO2 31 12/14/2014   TSH 2.70 04/21/2014   PSA 0.45 04/21/2014   INR 1.4 02/17/2014   HGBA1C 6.9* 12/14/2014   MICROALBUR 2.9* 04/21/2014       Assessment & Plan:   Problem List Items Addressed This Visit    Hematoma    Small raised hematoma - left lower anterior leg.  Minimal tenderness.  Per pt improving.  bactroban topically - to lesion.  Follow.  Should continue to reabsorb.        Leg laceration - Primary    Small healing lesion on right leg.  bactroban topically.  Follow.  Will check on tetanus.  He thinks he is up to date.           Kyle Pheasant, MD

## 2015-04-05 NOTE — Progress Notes (Signed)
Pre visit review using our clinic review tool, if applicable. No additional management support is needed unless otherwise documented below in the visit note. 

## 2015-04-15 ENCOUNTER — Encounter: Payer: Self-pay | Admitting: Internal Medicine

## 2015-04-16 ENCOUNTER — Telehealth: Payer: Self-pay | Admitting: Internal Medicine

## 2015-04-16 ENCOUNTER — Ambulatory Visit (INDEPENDENT_AMBULATORY_CARE_PROVIDER_SITE_OTHER): Payer: Medicare Other | Admitting: Internal Medicine

## 2015-04-16 ENCOUNTER — Other Ambulatory Visit (INDEPENDENT_AMBULATORY_CARE_PROVIDER_SITE_OTHER): Payer: Medicare Other

## 2015-04-16 ENCOUNTER — Encounter: Payer: Self-pay | Admitting: Internal Medicine

## 2015-04-16 VITALS — BP 158/78 | HR 60 | Temp 97.8°F | Resp 12 | Ht 67.0 in | Wt 176.8 lb

## 2015-04-16 DIAGNOSIS — E119 Type 2 diabetes mellitus without complications: Secondary | ICD-10-CM | POA: Diagnosis not present

## 2015-04-16 DIAGNOSIS — I10 Essential (primary) hypertension: Secondary | ICD-10-CM

## 2015-04-16 DIAGNOSIS — I2510 Atherosclerotic heart disease of native coronary artery without angina pectoris: Secondary | ICD-10-CM

## 2015-04-16 DIAGNOSIS — E7800 Pure hypercholesterolemia, unspecified: Secondary | ICD-10-CM

## 2015-04-16 DIAGNOSIS — Z125 Encounter for screening for malignant neoplasm of prostate: Secondary | ICD-10-CM | POA: Diagnosis not present

## 2015-04-16 DIAGNOSIS — F439 Reaction to severe stress, unspecified: Secondary | ICD-10-CM

## 2015-04-16 DIAGNOSIS — R42 Dizziness and giddiness: Secondary | ICD-10-CM

## 2015-04-16 DIAGNOSIS — Z658 Other specified problems related to psychosocial circumstances: Secondary | ICD-10-CM | POA: Diagnosis not present

## 2015-04-16 LAB — HEPATIC FUNCTION PANEL
ALT: 24 U/L (ref 0–53)
AST: 27 U/L (ref 0–37)
Albumin: 3.8 g/dL (ref 3.5–5.2)
Alkaline Phosphatase: 67 U/L (ref 39–117)
BILIRUBIN DIRECT: 0.2 mg/dL (ref 0.0–0.3)
Total Bilirubin: 1 mg/dL (ref 0.2–1.2)
Total Protein: 6.1 g/dL (ref 6.0–8.3)

## 2015-04-16 LAB — TSH: TSH: 2.52 u[IU]/mL (ref 0.35–4.50)

## 2015-04-16 LAB — BASIC METABOLIC PANEL
BUN: 16 mg/dL (ref 6–23)
CALCIUM: 9.1 mg/dL (ref 8.4–10.5)
CO2: 29 mEq/L (ref 19–32)
Chloride: 104 mEq/L (ref 96–112)
Creatinine, Ser: 0.9 mg/dL (ref 0.40–1.50)
GFR: 85.15 mL/min (ref >60.00)
GLUCOSE: 118 mg/dL — AB (ref 70–99)
POTASSIUM: 4.3 meq/L (ref 3.5–5.1)
Sodium: 139 mEq/L (ref 135–145)

## 2015-04-16 LAB — LIPID PANEL
CHOL/HDL RATIO: 5
Cholesterol: 166 mg/dL (ref 0–200)
HDL: 36.7 mg/dL — AB (ref >39.00)
NonHDL: 129.3
Triglycerides: 239 mg/dL — ABNORMAL HIGH (ref 0.0–149.0)
VLDL: 47.8 mg/dL — AB (ref 0.0–40.0)

## 2015-04-16 LAB — HEMOGLOBIN A1C: Hgb A1c MFr Bld: 6.6 % — ABNORMAL HIGH (ref 4.6–6.5)

## 2015-04-16 LAB — MICROALBUMIN / CREATININE URINE RATIO
Creatinine,U: 177.4 mg/dL
MICROALB UR: 4 mg/dL — AB (ref 0.0–1.9)
MICROALB/CREAT RATIO: 2.3 mg/g (ref 0.0–30.0)

## 2015-04-16 LAB — GLUCOSE, FINGERSTICK (STAT): Glucose, fingerstick: 156

## 2015-04-16 LAB — PSA, MEDICARE: PSA: 0.41 ng/mL (ref 0.10–4.00)

## 2015-04-16 LAB — LDL CHOLESTEROL, DIRECT: Direct LDL: 74 mg/dL

## 2015-04-16 NOTE — Telephone Encounter (Signed)
Pt being seen by MD

## 2015-04-16 NOTE — Telephone Encounter (Signed)
Pt states he rechecked his BP this morning at 9:30am and it was 130/69.  While in the clinic at 10:30am I rechecked it and it was 144/77.  Pt states he feels fine now.  Pt has not taken any medication yet today. Reports he has an appoint on 7.11.16.

## 2015-04-16 NOTE — Progress Notes (Signed)
Patient ID: Kyle Richardson, male   DOB: 1928-07-01, 81 y.o.   MRN: [id number]   Subjective:    Patient ID: Kyle Richardson, male    DOB: [birth year], 81 y.o.   MRN: [id number]  HPI  Patient here as a work in with concerns regarding some light headedness he experienced yesterday pm.  Also reports elevated blood pressure.  States he was outside yesterday.  Was hot.  Sweated a lot.  No cardiac symptoms with increased activity or exertion.  States mid afternoon, noticed a little light headedness with certain movements.  No headache.  No significant dizziness.  Has had some nasal congestion, but reports is no worse.  No fever.  Went inside.  Did not check his sugar.  After eating and drinking, felt better.  No light headedness today.  States blood pressure yesterday pm was 174/81. This am 130/69.  Was here for labs and wanted to be checked.  Has had no nausea or vomiting.  No diarrhea.  No sob.    Past Medical History  Diagnosis Date  . 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  s/p PTCA of the mid RCA 1/96, also  s/p  CABG x 4 7/96  . Hypertension   . Hypercholesterolemia   . Degenerative disc disease   . Migraine headache     history of  . Sleep apnea   . Diabetes mellitus   . Osteoarthritis   . Glaucoma     Current Outpatient Prescriptions on File Prior to Visit  Medication Sig Dispense Refill  . ACCU-CHEK SOFTCLIX LANCETS lancets USE AS DIRECTED 100 each 1  . aspirin 325 MG tablet Take 325 mg by mouth daily.    Marland Kitchen atorvastatin (LIPITOR) 20 MG tablet TAKE ONE (1) TABLET EACH DAY 30 tablet 6  . calcium citrate-vitamin D (CITRACAL+D) 315-200 MG-UNIT per tablet Take 1 tablet by mouth daily.    . Chlorpheniramine Maleate (ALLERGY RELIEF PO) Take by mouth daily.    Marland Kitchen FLUoxetine (PROZAC) 10 MG capsule TAKE 1 CAPSULE BY MOUTH EVERY DAY. 30 capsule 2  . fluticasone (FLONASE) 50 MCG/ACT nasal spray TWO PUFFS IN EACH NOSTRIL ONCE A DAY 16 g 4  . glucose blood  (ACCU-CHEK AVIVA PLUS) test strip Check blood sugar once daily. Dx 250.00 50 each 3  . latanoprost (XALATAN) 0.005 % ophthalmic solution Place 1 drop into both eyes at bedtime.    . Multiple Vitamin (MULTIVITAMIN) tablet Take 1 tablet by mouth daily.    . Multiple Vitamins-Minerals (PRESERVISION AREDS 2) CAPS Take by mouth daily.    . mupirocin ointment (BACTROBAN) 2 % Apply to affected areas bid 22 g 0  . nitroGLYCERIN (NITROSTAT) 0.4 MG SL tablet Place 0.4 mg under the tongue every 5 (five) minutes as needed. As directed    . pantoprazole (PROTONIX) 40 MG tablet Take 40 mg by mouth daily.     No current facility-administered medications on file prior to visit.    Review of Systems  Constitutional: Negative for appetite change and unexpected weight change.  HENT: Negative for sore throat.        Some nasal congestion, but is no worse.    Eyes: Negative for pain and visual disturbance.  Respiratory: Negative for cough, chest tightness and shortness of breath.   Cardiovascular: Negative for chest pain, palpitations and leg swelling.  Gastrointestinal: Negative for nausea, vomiting, abdominal pain and diarrhea.  Genitourinary: Negative for dysuria and difficulty urinating.  Skin:  Negative for color change and rash.  Neurological: Positive for light-headedness. Negative for weakness and headaches.  Psychiatric/Behavioral: Negative for dysphoric mood and agitation.       Objective:    Physical Exam  Constitutional: He appears well-developed and well-nourished. No distress.  HENT:  Nose: Nose normal.  Mouth/Throat: Oropharynx is clear and moist.  Neck: Neck supple.  Cardiovascular: Normal rate and regular rhythm.   Pulmonary/Chest: Effort normal and breath sounds normal. No respiratory distress.  Abdominal: Soft. Bowel sounds are normal. There is no tenderness.  Musculoskeletal: He exhibits no edema or tenderness.  Lymphadenopathy:    He has no cervical adenopathy.  Skin: No rash  noted. No erythema.  Psychiatric: He has a normal mood and affect. His behavior is normal.    BP 158/78 mmHg  Pulse 60  Temp(Src) 97.8 F (36.6 C) (Oral)  Resp 12  Ht 5\' 7"  (1.702 m)  Wt 176 lb 12 oz (80.173 kg)  BMI 27.68 kg/m2  SpO2 96% Wt Readings from Last 3 Encounters:  04/16/15 176 lb 12 oz (80.173 kg)  04/05/15 177 lb 6 oz (80.457 kg)  12/17/14 178 lb 6 oz (80.91 kg)     Lab Results  Component Value Date   WBC 5.1 12/14/2014   HGB 15.5 12/14/2014   HCT 44.7 12/14/2014   PLT 196.0 12/14/2014   GLUCOSE 156 04/16/2015   CHOL 166 04/16/2015   TRIG 239.0* 04/16/2015   HDL 36.70* 04/16/2015   LDLDIRECT 74.0 04/16/2015   LDLCALC 61 07/24/2014   ALT 24 04/16/2015   AST 27 04/16/2015   NA 139 04/16/2015   K 4.3 04/16/2015   CL 104 04/16/2015   CREATININE 0.90 04/16/2015   BUN 16 04/16/2015   CO2 29 04/16/2015   TSH 2.52 04/16/2015   PSA 0.41 04/16/2015   INR 1.4 02/17/2014   HGBA1C 6.6* 04/16/2015   MICROALBUR 4.0* 04/16/2015       Assessment & Plan:   Problem List Items Addressed This Visit    CAD (coronary artery disease)    Followed by cardiology.   Just had stress test 12/14/14 - negative for ischemia.  Reports no symptoms with increased activity or exertion.        Diabetes mellitus    Sugar has been doing well.  Unclear if some of his symptoms yesterday could have been from a low blood sugar.  Blood sugar today 156.  Follow.  Encouraged to eat regular meals and not go long periods without eating.        Hypertension    Blood pressure has been under good control.  Elevated today and yesterday pm.  His check this am - 220U systolic reading.  Hold on adding any medication.  Have him follow his blood pressure.  He has an appointment with me on Monday 04/19/15.  Bring in readings.  Recheck at that appointment.  Metabolic panel checked today.       Light headedness    Unclear etiology.  He was out in the heat.  Increased sweating.  Discussed importance of  staying hydrated and also eating regular meals.  No headache.  No focal symptoms.  Exam as outlined today.  Will hold on further testing today.  May have been a little volume depleted.  Have him spot check pressures.  Keep appt on Monday.         Other Visit Diagnoses    Diabetes mellitus without complication    -  Primary    Relevant  Orders    Glucose, fingerstick (stat) (Completed)      I spent 25 minutes with the patient and more than 50% of the time was spent in consultation regarding the above.     Einar Pheasant, MD

## 2015-04-16 NOTE — Telephone Encounter (Signed)
Will see today.  

## 2015-04-16 NOTE — Progress Notes (Signed)
Pre visit review using our clinic review tool, if applicable. No additional management support is needed unless otherwise documented below in the visit note. 

## 2015-04-16 NOTE — Telephone Encounter (Signed)
Will need to clarify exactly what happened.  He may need to be seen.

## 2015-04-16 NOTE — Telephone Encounter (Signed)
Pt here now, see office visit.

## 2015-04-16 NOTE — Telephone Encounter (Signed)
Pt came infor lab appt and wanted his BP checked due to having a reading of 174/81 last night. Pt given a triage form to fill out. Triage form given to [facility]

## 2015-04-17 ENCOUNTER — Encounter: Payer: Self-pay | Admitting: Internal Medicine

## 2015-04-19 ENCOUNTER — Ambulatory Visit (INDEPENDENT_AMBULATORY_CARE_PROVIDER_SITE_OTHER): Payer: Medicare Other | Admitting: Internal Medicine

## 2015-04-19 ENCOUNTER — Encounter: Payer: Self-pay | Admitting: Internal Medicine

## 2015-04-19 VITALS — BP 120/60 | HR 64 | Temp 98.0°F | Resp 18 | Ht 67.0 in | Wt 178.2 lb

## 2015-04-19 DIAGNOSIS — I10 Essential (primary) hypertension: Secondary | ICD-10-CM

## 2015-04-19 DIAGNOSIS — F439 Reaction to severe stress, unspecified: Secondary | ICD-10-CM

## 2015-04-19 DIAGNOSIS — I2510 Atherosclerotic heart disease of native coronary artery without angina pectoris: Secondary | ICD-10-CM | POA: Diagnosis not present

## 2015-04-19 DIAGNOSIS — E119 Type 2 diabetes mellitus without complications: Secondary | ICD-10-CM | POA: Diagnosis not present

## 2015-04-19 DIAGNOSIS — R42 Dizziness and giddiness: Secondary | ICD-10-CM

## 2015-04-19 DIAGNOSIS — Z0000 Encounter for general adult medical examination without abnormal findings: Secondary | ICD-10-CM

## 2015-04-19 DIAGNOSIS — E7800 Pure hypercholesterolemia, unspecified: Secondary | ICD-10-CM

## 2015-04-19 DIAGNOSIS — Z658 Other specified problems related to psychosocial circumstances: Secondary | ICD-10-CM

## 2015-04-19 NOTE — Progress Notes (Signed)
Patient ID: Kyle Richardson, male   DOB: 05/09/1928, 81 y.o.   MRN: [id number]   Subjective:    Patient ID: Kyle Richardson, male    DOB: 19-Jun-1928, 81 y.o.   MRN: [id number]  HPI  Patient here for a scheduled follow up.  Was just evaluated a few days ago for light headedness.  See last note for details.  He has done well since.  Stays active.  No cardiac symptoms with increased activity or exertion.  No sob.  Blood pressures have been doing better.  Blood pressure since he visit - mostly averaging 117-134/60s.  No light headedness.  No dizziness.  No syncope or near syncope.  Bowels stable.     Past Medical History  Diagnosis Date  . 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  s/p PTCA of the mid RCA 1/96, also  s/p  CABG x 4 7/96  . Hypertension   . Hypercholesterolemia   . Degenerative disc disease   . Migraine headache     history of  . Sleep apnea   . Diabetes mellitus   . Osteoarthritis   . Glaucoma     Outpatient Encounter Prescriptions as of 04/19/2015  Medication Sig  . ACCU-CHEK SOFTCLIX LANCETS lancets USE AS DIRECTED  . aspirin 325 MG tablet Take 325 mg by mouth daily.  Marland Kitchen atorvastatin (LIPITOR) 20 MG tablet TAKE ONE (1) TABLET EACH DAY  . calcium citrate-vitamin D (CITRACAL+D) 315-200 MG-UNIT per tablet Take 1 tablet by mouth daily.  . Chlorpheniramine Maleate (ALLERGY RELIEF PO) Take by mouth daily.  Marland Kitchen FLUoxetine (PROZAC) 10 MG capsule TAKE 1 CAPSULE BY MOUTH EVERY DAY.  . fluticasone (FLONASE) 50 MCG/ACT nasal spray TWO PUFFS IN EACH NOSTRIL ONCE A DAY  . glucose blood (ACCU-CHEK AVIVA PLUS) test strip Check blood sugar once daily. Dx 250.00  . latanoprost (XALATAN) 0.005 % ophthalmic solution Place 1 drop into both eyes at bedtime.  . Multiple Vitamin (MULTIVITAMIN) tablet Take 1 tablet by mouth daily.  . Multiple Vitamins-Minerals (PRESERVISION AREDS 2) CAPS Take by mouth daily.  . mupirocin ointment (BACTROBAN) 2 % Apply to affected  areas bid  . nitroGLYCERIN (NITROSTAT) 0.4 MG SL tablet Place 0.4 mg under the tongue every 5 (five) minutes as needed. As directed  . pantoprazole (PROTONIX) 40 MG tablet Take 40 mg by mouth daily.   No facility-administered encounter medications on file as of 04/19/2015.    Review of Systems  Constitutional: Negative for appetite change and unexpected weight change.  HENT: Negative for congestion and sinus pressure.   Respiratory: Negative for cough, chest tightness and shortness of breath.   Cardiovascular: Negative for chest pain, palpitations and leg swelling.  Gastrointestinal: Negative for nausea, vomiting, abdominal pain and diarrhea.  Neurological: Negative for dizziness, light-headedness and headaches.  Psychiatric/Behavioral: Negative for dysphoric mood and agitation.       Objective:     Blood pressure recheck:  132/68  Physical Exam  Constitutional: He appears well-developed and well-nourished. No distress.  HENT:  Nose: Nose normal.  Mouth/Throat: Oropharynx is clear and moist.  Neck: Neck supple. No thyromegaly present.  Cardiovascular: Normal rate and regular rhythm.   Pulmonary/Chest: Effort normal and breath sounds normal. No respiratory distress.  Abdominal: Soft. Bowel sounds are normal. There is no tenderness.  Musculoskeletal: He exhibits no edema or tenderness.  Lymphadenopathy:    He has no cervical adenopathy.  Skin: No rash noted. No erythema.  Psychiatric: He  has a normal mood and affect. His behavior is normal.    BP 120/60 mmHg  Pulse 64  Temp(Src) 98 F (36.7 C) (Oral)  Resp 18  Ht 5\' 7"  (1.702 m)  Wt 178 lb 4 oz (80.854 kg)  BMI 27.91 kg/m2  SpO2 95% Wt Readings from Last 3 Encounters:  04/19/15 178 lb 4 oz (80.854 kg)  04/16/15 176 lb 12 oz (80.173 kg)  04/05/15 177 lb 6 oz (80.457 kg)     Lab Results  Component Value Date   WBC 5.1 12/14/2014   HGB 15.5 12/14/2014   HCT 44.7 12/14/2014   PLT 196.0 12/14/2014   GLUCOSE 156  04/16/2015   CHOL 166 04/16/2015   TRIG 239.0* 04/16/2015   HDL 36.70* 04/16/2015   LDLDIRECT 74.0 04/16/2015   LDLCALC 61 07/24/2014   ALT 24 04/16/2015   AST 27 04/16/2015   NA 139 04/16/2015   K 4.3 04/16/2015   CL 104 04/16/2015   CREATININE 0.90 04/16/2015   BUN 16 04/16/2015   CO2 29 04/16/2015   TSH 2.52 04/16/2015   PSA 0.41 04/16/2015   INR 1.4 02/17/2014   HGBA1C 6.6* 04/16/2015   MICROALBUR 4.0* 04/16/2015       Assessment & Plan:   Problem List Items Addressed This Visit    CAD (coronary artery disease) - Primary    Followed by cardiology.  Stress test 12/14/14 - negative for ischemia.  Continue risk factor modification.       Diabetes mellitus    Sugars doing well.  Discussed importance of eating regular meals and staying hydrated.  Follow sugars.   Lab Results  Component Value Date   HGBA1C 6.6* 04/16/2015         Relevant Orders   Hemoglobin A1c   Health care maintenance    Physical 12/17/14.  PSA .41 04/16/15.  Colonoscopy 01/30/12 - 75mm transverse polyps, diverticulosis, redundant colon and thrombosed internal hemorrhoids.        Hypercholesterolemia    On atorvastatin.  Low cholesterol diet and exercise.  Follow lipid panel and liver function tests.       Relevant Orders   Lipid panel   Hepatic function panel   Hypertension    Blood pressure doing well.  Continue to monitor.        Relevant Orders   Basic metabolic panel   Light headedness    No reoccurring episodes.  Feel may have been related to the heat, etc.  Stay hydrated.  Eat regular meals.  Follow.        Stress    Does not feel needs any further intervention at this time.  Follow.           Einar Pheasant, MD

## 2015-04-19 NOTE — Assessment & Plan Note (Signed)
Kyle Richardson has been doing well.  Unclear if some of his symptoms yesterday could have been from a low blood Kyle Richardson.  Blood Kyle Richardson today 156.  Follow.  Encouraged to eat regular meals and not go long periods without eating.

## 2015-04-19 NOTE — Assessment & Plan Note (Signed)
Followed by cardiology.   Just had stress test 12/14/14 - negative for ischemia.  Reports no symptoms with increased activity or exertion.

## 2015-04-19 NOTE — Progress Notes (Signed)
Pre visit review using our clinic review tool, if applicable. No additional management support is needed unless otherwise documented below in the visit note. 

## 2015-04-19 NOTE — Assessment & Plan Note (Signed)
Blood pressure has been under good control.  Elevated today and yesterday pm.  His check this am - 903P systolic reading.  Hold on adding any medication.  Have him follow his blood pressure.  He has an appointment with me on Monday 04/19/15.  Bring in readings.  Recheck at that appointment.  Metabolic panel checked today.

## 2015-04-19 NOTE — Assessment & Plan Note (Signed)
Unclear etiology.  He was out in the heat.  Increased sweating.  Discussed importance of staying hydrated and also eating regular meals.  No headache.  No focal symptoms.  Exam as outlined today.  Will hold on further testing today.  May have been a little volume depleted.  Have him spot check pressures.  Keep appt on Monday.

## 2015-04-25 ENCOUNTER — Encounter: Payer: Self-pay | Admitting: Internal Medicine

## 2015-04-25 NOTE — Assessment & Plan Note (Signed)
Physical 12/17/14.  PSA .41 04/16/15.  Colonoscopy 01/30/12 - 96mm transverse polyps, diverticulosis, redundant colon and thrombosed internal hemorrhoids.

## 2015-04-25 NOTE — Assessment & Plan Note (Signed)
Blood pressure doing well.  Continue to monitor.

## 2015-04-25 NOTE — Assessment & Plan Note (Signed)
Followed by cardiology.  Stress test 12/14/14 - negative for ischemia.  Continue risk factor modification.

## 2015-04-25 NOTE — Assessment & Plan Note (Signed)
Does not feel needs any further intervention at this time.  Follow.  

## 2015-04-25 NOTE — Assessment & Plan Note (Signed)
Sugars doing well.  Discussed importance of eating regular meals and staying hydrated.  Follow sugars.   Lab Results  Component Value Date   HGBA1C 6.6* 04/16/2015

## 2015-04-25 NOTE — Assessment & Plan Note (Signed)
On atorvastatin.  Low cholesterol diet and exercise.  Follow lipid panel and liver function tests.  

## 2015-04-25 NOTE — Assessment & Plan Note (Signed)
No reoccurring episodes.  Feel may have been related to the heat, etc.  Stay hydrated.  Eat regular meals.  Follow.

## 2015-05-05 ENCOUNTER — Other Ambulatory Visit: Payer: Self-pay | Admitting: Internal Medicine

## 2015-06-03 ENCOUNTER — Ambulatory Visit (INDEPENDENT_AMBULATORY_CARE_PROVIDER_SITE_OTHER): Payer: Medicare Other

## 2015-06-03 DIAGNOSIS — Z23 Encounter for immunization: Secondary | ICD-10-CM | POA: Diagnosis not present

## 2015-06-08 ENCOUNTER — Telehealth: Payer: Self-pay | Admitting: Internal Medicine

## 2015-06-08 NOTE — Telephone Encounter (Signed)
Pt wants to know if he's due for a Tetanus and Tdap? Thank you!

## 2015-06-08 NOTE — Telephone Encounter (Signed)
Thank you so much

## 2015-06-08 NOTE — Telephone Encounter (Signed)
Yes pt is due a TDap

## 2015-06-16 ENCOUNTER — Ambulatory Visit: Payer: Medicare Other

## 2015-06-29 ENCOUNTER — Ambulatory Visit: Payer: Medicare Other

## 2015-06-29 DIAGNOSIS — Z23 Encounter for immunization: Secondary | ICD-10-CM

## 2015-06-29 MED ORDER — TETANUS-DIPHTH-ACELL PERTUSSIS 5-2.5-18.5 LF-MCG/0.5 IM SUSP
0.5000 mL | Freq: Once | INTRAMUSCULAR | Status: AC
Start: 2015-06-29 — End: 2015-06-29
  Administered 2015-06-29: 0.5 mL via INTRAMUSCULAR

## 2015-08-20 ENCOUNTER — Ambulatory Visit (INDEPENDENT_AMBULATORY_CARE_PROVIDER_SITE_OTHER): Payer: Medicare Other

## 2015-08-20 ENCOUNTER — Other Ambulatory Visit (INDEPENDENT_AMBULATORY_CARE_PROVIDER_SITE_OTHER): Payer: Medicare Other

## 2015-08-20 VITALS — BP 128/68 | HR 60 | Temp 98.0°F | Resp 14 | Ht 67.0 in | Wt 180.8 lb

## 2015-08-20 DIAGNOSIS — I10 Essential (primary) hypertension: Secondary | ICD-10-CM | POA: Diagnosis not present

## 2015-08-20 DIAGNOSIS — E119 Type 2 diabetes mellitus without complications: Secondary | ICD-10-CM | POA: Diagnosis not present

## 2015-08-20 DIAGNOSIS — E7800 Pure hypercholesterolemia, unspecified: Secondary | ICD-10-CM

## 2015-08-20 DIAGNOSIS — Z0000 Encounter for general adult medical examination without abnormal findings: Secondary | ICD-10-CM

## 2015-08-20 LAB — BASIC METABOLIC PANEL
BUN: 17 mg/dL (ref 6–23)
CHLORIDE: 105 meq/L (ref 96–112)
CO2: 29 meq/L (ref 19–32)
Calcium: 9.2 mg/dL (ref 8.4–10.5)
Creatinine, Ser: 1.06 mg/dL (ref 0.40–1.50)
GFR: 70.44 mL/min (ref >60.00)
GLUCOSE: 143 mg/dL — AB (ref 70–99)
Potassium: 4.4 mEq/L (ref 3.5–5.1)
SODIUM: 141 meq/L (ref 135–145)

## 2015-08-20 LAB — LIPID PANEL
CHOL/HDL RATIO: 4
Cholesterol: 171 mg/dL (ref 0–200)
HDL: 38.1 mg/dL — AB (ref >39.00)
NONHDL: 133.25
Triglycerides: 234 mg/dL — ABNORMAL HIGH (ref 0.0–149.0)
VLDL: 46.8 mg/dL — AB (ref 0.0–40.0)

## 2015-08-20 LAB — HEPATIC FUNCTION PANEL
ALT: 26 U/L (ref 0–53)
AST: 26 U/L (ref 0–37)
Albumin: 3.9 g/dL (ref 3.5–5.2)
Alkaline Phosphatase: 57 U/L (ref 39–117)
Bilirubin, Direct: 0.1 mg/dL (ref 0.0–0.3)
Total Bilirubin: 0.9 mg/dL (ref 0.2–1.2)
Total Protein: 6.3 g/dL (ref 6.0–8.3)

## 2015-08-20 LAB — LDL CHOLESTEROL, DIRECT: Direct LDL: 80 mg/dL

## 2015-08-20 LAB — HEMOGLOBIN A1C: HEMOGLOBIN A1C: 6.9 % — AB (ref 4.6–6.5)

## 2015-08-20 NOTE — Progress Notes (Signed)
Subjective:   Rawleigh Mazeika is a 81 y.o. male who presents for an Initial Medicare Annual Wellness Visit.  Review of Systems  No ROS.  Medicare Wellness Visit. Cardiac Risk Factors include: advanced age (>65men, >54 women);diabetes mellitus;hypertension;male gender    Objective:    Today's Vitals   08/20/15 0833  BP: 128/68  Pulse: 60  Temp: 98 F (36.7 C)  TempSrc: Oral  Resp: 14  Height: 5\' 7"  (1.702 m)  Weight: 180 lb 12.8 oz (82.01 kg)  SpO2: 96%    Current Medications (verified) Outpatient Encounter Prescriptions as of 08/20/2015  Medication Sig  . ACCU-CHEK SOFTCLIX LANCETS lancets USE AS DIRECTED  . aspirin 325 MG tablet Take 325 mg by mouth daily.  Marland Kitchen atorvastatin (LIPITOR) 20 MG tablet TAKE ONE (1) TABLET EACH DAY  . calcium citrate-vitamin D (CITRACAL+D) 315-200 MG-UNIT per tablet Take 1 tablet by mouth daily.  . Chlorpheniramine Maleate (ALLERGY RELIEF PO) Take by mouth daily.  Marland Kitchen FLUoxetine (PROZAC) 10 MG capsule TAKE ONE (1) CAPSULE EACH DAY  . fluticasone (FLONASE) 50 MCG/ACT nasal spray TWO PUFFS IN EACH NOSTRIL ONCE A DAY  . glucose blood (ACCU-CHEK AVIVA PLUS) test strip Check blood sugar once daily. Dx 250.00  . latanoprost (XALATAN) 0.005 % ophthalmic solution Place 1 drop into both eyes at bedtime.  . Multiple Vitamin (MULTIVITAMIN) tablet Take 1 tablet by mouth daily.  . Multiple Vitamins-Minerals (PRESERVISION AREDS 2) CAPS Take by mouth daily.  . mupirocin ointment (BACTROBAN) 2 % Apply to affected areas bid  . nitroGLYCERIN (NITROSTAT) 0.4 MG SL tablet Place 0.4 mg under the tongue every 5 (five) minutes as needed. As directed  . pantoprazole (PROTONIX) 40 MG tablet Take 40 mg by mouth daily.   No facility-administered encounter medications on file as of 08/20/2015.    Allergies (verified) Codeine; Flomax; Lamisil; and Sulfa antibiotics   History: Past Medical History  Diagnosis Date  . 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  s/p PTCA of the mid RCA 1/96, also  s/p  CABG x 4 7/96  . Hypertension   . Hypercholesterolemia   . Degenerative disc disease   . Migraine headache     history of  . Sleep apnea   . Diabetes mellitus ([place])   . Osteoarthritis   . Glaucoma    Past Surgical History  Procedure Laterality Date  . Coronary artery bypass graft  7/96    x4  . Hernia repair    . Appendectomy    . Upp and tonsillectomy  1/86  . Hemorrhoid surgery    . Cholecystectomy      Removed   Family History  Problem Relation Age of Onset  . Heart disease Mother     died age 45  . Heart disease Father     and other vascular disease  . Diabetes      four siblings  . Heart disease Brother     s/p CABG  . Colon cancer Neg Hx   . Prostate cancer Neg Hx    Social History   Occupational History  . Not on file.   Social History Main Topics  . Smoking status: Never Smoker   . Smokeless tobacco: Never Used  . Alcohol Use: No  . Drug Use: No  . Sexual Activity: Not Currently   Tobacco Counseling Counseling given: Not Answered   Activities of Daily Living In your present state of health, do you  have any difficulty performing the following activities: 08/20/2015  Hearing? Y  Vision? N  Difficulty concentrating or making decisions? N  Walking or climbing stairs? N  Dressing or bathing? N  Doing errands, shopping? N  Preparing Food and eating ? N  Using the Toilet? N  In the past six months, have you accidently leaked urine? N  Do you have problems with loss of bowel control? N  Managing your Medications? N  Managing your Finances? N  Housekeeping or managing your Housekeeping? N    Immunizations and Health Maintenance Immunization History  Administered Date(s) Administered  . Influenza Split 08/19/2009  . Influenza,inj,Quad PF,36+ Mos 07/03/2013, 07/03/2014, 06/03/2015  . Pneumococcal Conjugate-13 08/13/1996, 07/09/2001, 07/09/2006, 01/05/2015  . Tdap 06/29/2015    Health Maintenance Due  Topic Date Due  . FOOT EXAM  05/07/2014    Patient Care Team: Einar Pheasant, MD as PCP - General (Internal Medicine)  Indicate any recent Medical Services you may have received from other than [facility] providers in the past year (date may be approximate).    Assessment:   This is a routine wellness examination for Jaishon.  The goal of the wellness visit is to assist the patient how to close the gaps in care and create a preventative care plan for the patient.   Medications reviewed; taking as prescribed and without barriers.  Calcium VIT D as appropriate/Osteoporosis discussed.  ZOSTAVAX postponed, per patient.  Education provided.  FOOT EXAM- declined.  Upcoming appointment with podiatrist, Dr. Elvina Mattes  Safety issues reviewed; smoke detectors in the home. Firearms locked in a secure area. Wears seatbelts when driving or riding with others. No violence in the home.  The patient was oriented x 3; appropriate in dress and manner and no objective failures at ADL's or IADL's.   Patient concerns:  None at this time.  Follow up with PCP as needed.  Hearing/Vision screen Hearing Screening Comments: Followed by [facility] Bilateral hearing aids  Vision Screening Comments: Followed by [facility] Last OV 08/2015  Dietary issues and exercise activities discussed: Current Exercise Habits:: The patient does not participate in regular exercise at present  Goals    . Increase physical activity     Works in the garden during the summer.  Patient centered goal is to walk 2 miles 3 days a week.         Depression Screen PHQ 2/9 Scores 08/20/2015 12/17/2014 02/03/2013 02/03/2013  PHQ - 2 Score 0 0 0 0    Fall Risk Fall Risk  08/20/2015 12/17/2014 02/03/2013 02/03/2013 12/16/2012  Falls in the past year? No No No No No    Cognitive Function: MMSE - Mini Mental State Exam 08/20/2015  Orientation to time 5  Orientation to Place 5  Registration 3   Attention/ Calculation 5  Recall 3  Language- name 2 objects 2  Language- repeat 1  Language- follow 3 step command 3  Language- read & follow direction 1  Write a sentence 1  Copy design 1  Total score 30    Screening Tests Health Maintenance  Topic Date Due  . FOOT EXAM  05/07/2014  . ZOSTAVAX  08/18/2016 (Originally 11/13/1989)  . OPHTHALMOLOGY EXAM  09/23/2015  . HEMOGLOBIN A1C  10/17/2015  . PNA vac Low Risk Adult (2 of 2 - PPSV23) 01/05/2016  . URINE MICROALBUMIN  04/15/2016  . INFLUENZA VACCINE  05/09/2016  . TETANUS/TDAP  06/28/2025        Plan:   End of life  planning; Advance aging; Advanced directives discussed. Copy requested of HCPOA/LivingWill form at follow up visit.  Return in 1 week for scheduled follow up.  Return in 1 year for AWV scheduled with Health Coach.    During the course of the visit Josimar was educated and counseled about the following appropriate screening and preventive services:   Vaccines to include Pneumoccal, Influenza, Hepatitis B, Td, Zostavax, HCV  Electrocardiogram  Colorectal cancer screening  Cardiovascular disease screening  Diabetes screening  Glaucoma screening  Nutrition counseling  Prostate cancer screening  Smoking cessation counseling  Patient Instructions (the written plan) were given to the patient.   Varney Biles, LPN   [id number]     Reviewed above information.  Agree with plan.    Dr Nicki Reaper

## 2015-08-20 NOTE — Patient Instructions (Addendum)
[name],  Thank you for taking time to come for your Medicare Wellness Visit.  I appreciate your ongoing commitment to your health goals. Please review the following plan we discussed and let me know if I can assist you in the future.  Bring a completed copy of advanced directives   Return in 1 week for scheduled follow up   Health Maintenance, Male A healthy lifestyle and preventative care can promote health and wellness.  Maintain regular health, dental, and eye exams.  Eat a healthy diet. Foods like vegetables, fruits, whole grains, low-fat dairy products, and lean protein foods contain the nutrients you need and are low in calories. Decrease your intake of foods high in solid fats, added sugars, and salt. Get information about a proper diet from your health care provider, if necessary.  Regular physical exercise is one of the most important things you can do for your health. Most adults should get at least 150 minutes of moderate-intensity exercise (any activity that increases your heart rate and causes you to sweat) each week. In addition, most adults need muscle-strengthening exercises on 2 or more days a week.   Maintain a healthy weight. The body mass index (BMI) is a screening tool to identify possible weight problems. It provides an estimate of body fat based on height and weight. Your health care provider can find your BMI and can help you achieve or maintain a healthy weight. For males 20 years and older:  A BMI below 18.5 is considered underweight.  A BMI of 18.5 to 24.9 is normal.  A BMI of 25 to 29.9 is considered overweight.  A BMI of 30 and above is considered obese.  Maintain normal blood lipids and cholesterol by exercising and minimizing your intake of saturated fat. Eat a balanced diet with plenty of fruits and vegetables. Blood tests for lipids and cholesterol should begin at age 12 and be repeated every 5 years. If your lipid or cholesterol levels are high, you  are over age 17, or you are at high risk for heart disease, you may need your cholesterol levels checked more frequently.Ongoing high lipid and cholesterol levels should be treated with medicines if diet and exercise are not working.  If you smoke, find out from your health care provider how to quit. If you do not use tobacco, do not start.  Lung cancer screening is recommended for adults aged 28-80 years who are at high risk for developing lung cancer because of a history of smoking. A yearly low-dose CT scan of the lungs is recommended for people who have at least a 30-pack-year history of smoking and are current smokers or have quit within the past 15 years. A pack year of smoking is smoking an average of 1 pack of cigarettes a day for 1 year (for example, a 30-pack-year history of smoking could mean smoking 1 pack a day for 30 years or 2 packs a day for 15 years). Yearly screening should continue until the smoker has stopped smoking for at least 15 years. Yearly screening should be stopped for people who develop a health problem that would prevent them from having lung cancer treatment.  If you choose to drink alcohol, do not have more than 2 drinks per day. One drink is considered to be 12 oz (360 mL) of beer, 5 oz (150 mL) of wine, or 1.5 oz (45 mL) of liquor.  Avoid the use of street drugs. Do not share needles with anyone. Ask for help  if you need support or instructions about stopping the use of drugs.  High blood pressure causes heart disease and increases the risk of stroke. High blood pressure is more likely to develop in:  People who have blood pressure in the end of the normal range (100-139/85-89 mm Hg).  People who are overweight or obese.  People who are African American.  If you are 47-39 years of age, have your blood pressure checked every 3-5 years. If you are 19 years of age or older, have your blood pressure checked every year. You should have your blood pressure measured  twice--once when you are at a hospital or clinic, and once when you are not at a hospital or clinic. Record the average of the two measurements. To check your blood pressure when you are not at a hospital or clinic, you can use:  An automated blood pressure machine at a pharmacy.  A home blood pressure monitor.  If you are 68-88 years old, ask your health care provider if you should take aspirin to prevent heart disease.  Diabetes screening involves taking a blood sample to check your fasting blood sugar level. This should be done once every 3 years after age 76 if you are at a normal weight and without risk factors for diabetes. Testing should be considered at a younger age or be carried out more frequently if you are overweight and have at least 1 risk factor for diabetes.  Colorectal cancer can be detected and often prevented. Most routine colorectal cancer screening begins at the age of 62 and continues through age 37. However, your health care provider may recommend screening at an earlier age if you have risk factors for colon cancer. On a yearly basis, your health care provider may provide home test kits to check for hidden blood in the stool. A small camera at the end of a tube may be used to directly examine the colon (sigmoidoscopy or colonoscopy) to detect the earliest forms of colorectal cancer. Talk to your health care provider about this at age 73 when routine screening begins. A direct exam of the colon should be repeated every 5-10 years through age 102, unless early forms of precancerous polyps or small growths are found.  People who are at an increased risk for hepatitis B should be screened for this virus. You are considered at high risk for hepatitis B if:  You were born in a country where hepatitis B occurs often. Talk with your health care provider about which countries are considered high risk.  Your parents were born in a high-risk country and you have not received a shot to  protect against hepatitis B (hepatitis B vaccine).  You have HIV or AIDS.  You use needles to inject street drugs.  You live with, or have sex with, someone who has hepatitis B.  You are a man who has sex with other men (MSM).  You get hemodialysis treatment.  You take certain medicines for conditions like cancer, organ transplantation, and autoimmune conditions.  Hepatitis C blood testing is recommended for all people born from [birth year] through 1963 and any individual with known risk factors for hepatitis C.  Healthy men should no longer receive prostate-specific antigen (PSA) blood tests as part of routine cancer screening. Talk to your health care provider about prostate cancer screening.  Testicular cancer screening is not recommended for adolescents or adult males who have no symptoms. Screening includes self-exam, a health care provider exam, and other screening tests.  Consult with your health care provider about any symptoms you have or any concerns you have about testicular cancer.  Practice safe sex. Use condoms and avoid high-risk sexual practices to reduce the spread of sexually transmitted infections (STIs).  You should be screened for STIs, including gonorrhea and chlamydia if:  You are sexually active and are younger than 24 years.  You are older than 24 years, and your health care provider tells you that you are at risk for this type of infection.  Your sexual activity has changed since you were last screened, and you are at an increased risk for chlamydia or gonorrhea. Ask your health care provider if you are at risk.  If you are at risk of being infected with HIV, it is recommended that you take a prescription medicine daily to prevent HIV infection. This is called pre-exposure prophylaxis (PrEP). You are considered at risk if:  You are a man who has sex with other men (MSM).  You are a heterosexual man who is sexually active with multiple partners.  You take drugs by  injection.  You are sexually active with a partner who has HIV.  Talk with your health care provider about whether you are at high risk of being infected with HIV. If you choose to begin PrEP, you should first be tested for HIV. You should then be tested every 3 months for as long as you are taking PrEP.  Use sunscreen. Apply sunscreen liberally and repeatedly throughout the day. You should seek shade when your shadow is shorter than you. Protect yourself by wearing long sleeves, pants, a wide-brimmed hat, and sunglasses year round whenever you are outdoors.  Tell your health care provider of new moles or changes in moles, especially if there is a change in shape or color. Also, tell your health care provider if a mole is larger than the size of a pencil eraser.  A one-time screening for abdominal aortic aneurysm (AAA) and surgical repair of large AAAs by ultrasound is recommended for men aged 41-75 years who are current or former smokers.  Stay current with your vaccines (immunizations).   This information is not intended to replace advice given to you by your health care provider. Make sure you discuss any questions you have with your health care provider.   Document Released: 03/23/2008 Document Revised: 10/16/2014 Document Reviewed: 02/20/2011 Elsevier Interactive Patient Education [employer].

## 2015-08-21 ENCOUNTER — Encounter: Payer: Self-pay | Admitting: Internal Medicine

## 2015-08-23 ENCOUNTER — Encounter: Payer: Self-pay | Admitting: Internal Medicine

## 2015-08-23 ENCOUNTER — Ambulatory Visit (INDEPENDENT_AMBULATORY_CARE_PROVIDER_SITE_OTHER): Payer: Medicare Other | Admitting: Internal Medicine

## 2015-08-23 VITALS — BP 120/70 | HR 65 | Temp 97.7°F | Ht 67.0 in | Wt 182.0 lb

## 2015-08-23 DIAGNOSIS — E119 Type 2 diabetes mellitus without complications: Secondary | ICD-10-CM | POA: Diagnosis not present

## 2015-08-23 DIAGNOSIS — I10 Essential (primary) hypertension: Secondary | ICD-10-CM

## 2015-08-23 DIAGNOSIS — G43809 Other migraine, not intractable, without status migrainosus: Secondary | ICD-10-CM

## 2015-08-23 DIAGNOSIS — E7800 Pure hypercholesterolemia, unspecified: Secondary | ICD-10-CM

## 2015-08-23 DIAGNOSIS — I2510 Atherosclerotic heart disease of native coronary artery without angina pectoris: Secondary | ICD-10-CM

## 2015-08-23 DIAGNOSIS — Z658 Other specified problems related to psychosocial circumstances: Secondary | ICD-10-CM

## 2015-08-23 DIAGNOSIS — F439 Reaction to severe stress, unspecified: Secondary | ICD-10-CM

## 2015-08-23 DIAGNOSIS — T148XXA Other injury of unspecified body region, initial encounter: Secondary | ICD-10-CM

## 2015-08-23 DIAGNOSIS — T148 Other injury of unspecified body region: Secondary | ICD-10-CM

## 2015-08-23 NOTE — Progress Notes (Signed)
Patient ID: Kyle Richardson, male   DOB: [birth year], 81 y.o.   MRN: [id number]   Subjective:    Patient ID: Kyle Richardson, male    DOB: Jul 12, 1928, 81 y.o.   MRN: [id number]  HPI  Patient with past history of CAD, hypertension, diabetes and hypercholesterolemia.  He comes in today to follow up on these issues.  Feels from a cardiac standpoint things are stable.  Had negative stress test 12/14/14.  Due to follow up with cardiology tomorrow.  S/p injection right third finger.  No problems reported today.  Does have right arm hematoma.  Shot a rifle and it kicked back and caused bruising.  No sob.  No acid reflux reported.  No nausea or vomiting.  No abdominal pain or cramping.  Bowels stable.  Handling stress well.  Does not feel needs any further intervention.      Past Medical History  Diagnosis Date  . 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  s/p PTCA of the mid RCA 1/96, also  s/p  CABG x 4 7/96  . Hypertension   . Hypercholesterolemia   . Degenerative disc disease   . Migraine headache     history of  . Sleep apnea   . Diabetes mellitus ([place])   . Osteoarthritis   . Glaucoma    Past Surgical History  Procedure Laterality Date  . Coronary artery bypass graft  7/96    x4  . Hernia repair    . Appendectomy    . Upp and tonsillectomy  1/86  . Hemorrhoid surgery    . Cholecystectomy      Removed   Family History  Problem Relation Age of Onset  . Heart disease Mother     died age 54  . Heart disease Father     and other vascular disease  . Diabetes      four siblings  . Heart disease Brother     s/p CABG  . Colon cancer Neg Hx   . Prostate cancer Neg Hx    Social History   Social History  . Marital Status: Married    Spouse Name: N/A  . Number of Children: 2  . Years of Education: N/A   Social History Main Topics  . Smoking status: Never Smoker   . Smokeless tobacco: Never Used  . Alcohol Use: No  . Drug Use: No  . Sexual  Activity: Not Currently   Other Topics Concern  . None   Social History Narrative    Outpatient Encounter Prescriptions as of 08/23/2015  Medication Sig  . ACCU-CHEK SOFTCLIX LANCETS lancets USE AS DIRECTED  . aspirin 325 MG tablet Take 325 mg by mouth daily.  . calcium citrate-vitamin D (CITRACAL+D) 315-200 MG-UNIT per tablet Take 1 tablet by mouth daily.  . Chlorpheniramine Maleate (ALLERGY RELIEF PO) Take by mouth daily.  Marland Kitchen FLUoxetine (PROZAC) 10 MG capsule TAKE ONE (1) CAPSULE EACH DAY  . fluticasone (FLONASE) 50 MCG/ACT nasal spray TWO PUFFS IN EACH NOSTRIL ONCE A DAY  . glucose blood (ACCU-CHEK AVIVA PLUS) test strip Check blood sugar once daily. Dx 250.00  . latanoprost (XALATAN) 0.005 % ophthalmic solution Place 1 drop into both eyes at bedtime.  . Multiple Vitamin (MULTIVITAMIN) tablet Take 1 tablet by mouth daily.  . Multiple Vitamins-Minerals (PRESERVISION AREDS 2) CAPS Take by mouth daily.  . mupirocin ointment (BACTROBAN) 2 % Apply to affected areas bid  . nitroGLYCERIN (NITROSTAT) 0.4 MG  SL tablet Place 0.4 mg under the tongue every 5 (five) minutes as needed. As directed  . pantoprazole (PROTONIX) 40 MG tablet Take 40 mg by mouth daily.  . [DISCONTINUED] atorvastatin (LIPITOR) 20 MG tablet TAKE ONE (1) TABLET EACH DAY   No facility-administered encounter medications on file as of 08/23/2015.    Review of Systems  Constitutional: Negative for appetite change and unexpected weight change.  HENT: Negative for congestion and sinus pressure.   Eyes: Negative for pain and discharge.  Respiratory: Negative for cough, chest tightness and shortness of breath.   Cardiovascular: Negative for chest pain, palpitations and leg swelling.  Gastrointestinal: Negative for nausea, vomiting, abdominal pain and diarrhea.  Genitourinary: Negative for dysuria and difficulty urinating.  Musculoskeletal: Negative for back pain and joint swelling.  Skin: Negative for color change and rash.    Neurological: Negative for dizziness, light-headedness and headaches.  Psychiatric/Behavioral: Negative for dysphoric mood and agitation.       Objective:    Physical Exam  Constitutional: He appears well-developed and well-nourished. No distress.  HENT:  Nose: Nose normal.  Mouth/Throat: Oropharynx is clear and moist.  Eyes: Conjunctivae are normal. Right eye exhibits no discharge. Left eye exhibits no discharge.  Neck: Neck supple. No thyromegaly present.  Cardiovascular: Normal rate and regular rhythm.   Pulmonary/Chest: Effort normal and breath sounds normal. No respiratory distress.  Abdominal: Soft. Bowel sounds are normal. There is no tenderness.  Musculoskeletal: He exhibits no edema or tenderness.  Lymphadenopathy:    He has no cervical adenopathy.  Skin: No rash noted. No erythema.  Right upper arm hematoma.    Psychiatric: He has a normal mood and affect. His behavior is normal.    BP 120/70 mmHg  Pulse 65  Temp(Src) 97.7 F (36.5 C) (Oral)  Ht '5\' 7"'  (1.702 m)  Wt 182 lb (82.555 kg)  BMI 28.50 kg/m2  SpO2 96% Wt Readings from Last 3 Encounters:  08/23/15 182 lb (82.555 kg)  08/20/15 180 lb 12.8 oz (82.01 kg)  04/19/15 178 lb 4 oz (80.854 kg)     Lab Results  Component Value Date   WBC 5.1 12/14/2014   HGB 15.5 12/14/2014   HCT 44.7 12/14/2014   PLT 196.0 12/14/2014   GLUCOSE 143* 08/20/2015   CHOL 171 08/20/2015   TRIG 234.0* 08/20/2015   HDL 38.10* 08/20/2015   LDLDIRECT 80.0 08/20/2015   LDLCALC 61 07/24/2014   ALT 26 08/20/2015   AST 26 08/20/2015   NA 141 08/20/2015   K 4.4 08/20/2015   CL 105 08/20/2015   CREATININE 1.06 08/20/2015   BUN 17 08/20/2015   CO2 29 08/20/2015   TSH 2.52 04/16/2015   PSA 0.41 04/16/2015   INR 1.4 02/17/2014   HGBA1C 6.9* 08/20/2015   MICROALBUR 4.0* 04/16/2015       Assessment & Plan:   Problem List Items Addressed This Visit    CAD (coronary artery disease)    Followed by cardiology.  Stress test  12/14/14 - negative.  Continue risk factor modification.  Has f/u scheduled tomorrow.        Diabetes mellitus ([place])    Low carb diet and exercise.  Follow met b and a1c.   Lab Results  Component Value Date   HGBA1C 6.9* 08/20/2015        Relevant Orders   Hemoglobin A1c   Hematoma    Small raised hematoma - right upper arm.  Should resolve.  Follow.  Hypercholesterolemia    On atorvastatin.  Low cholesterol diet and exercise.  Follow lipid panel and liver function tests.        Relevant Orders   Hepatic function panel   Lipid panel   Hypertension - Primary    Blood pressure under good control.  Continue same medication regimen.  Follow pressures.  Follow metabolic panel.        Relevant Orders   Basic metabolic panel   Migraine    Previously worked up by neurology.  Not an issue now.  Follow.        Stress    On fluoxetine.  Stable.            Einar Pheasant, MD

## 2015-08-23 NOTE — Progress Notes (Signed)
Pre-visit discussion using our clinic review tool. No additional management support is needed unless otherwise documented below in the visit note.  

## 2015-08-27 ENCOUNTER — Other Ambulatory Visit: Payer: Self-pay | Admitting: Internal Medicine

## 2015-08-29 ENCOUNTER — Encounter: Payer: Self-pay | Admitting: Internal Medicine

## 2015-08-29 NOTE — Assessment & Plan Note (Signed)
Blood pressure under good control.  Continue same medication regimen.  Follow pressures.  Follow metabolic panel.   

## 2015-08-29 NOTE — Assessment & Plan Note (Signed)
Previously worked up by neurology.  Not an issue now.  Follow.

## 2015-08-29 NOTE — Assessment & Plan Note (Signed)
On atorvastatin.  Low cholesterol diet and exercise.  Follow lipid panel and liver function tests.  

## 2015-08-29 NOTE — Assessment & Plan Note (Signed)
Small raised hematoma - right upper arm.  Should resolve.  Follow.

## 2015-08-29 NOTE — Assessment & Plan Note (Signed)
Low carb diet and exercise.  Follow met b and a1c.   Lab Results  Component Value Date   HGBA1C 6.9* 08/20/2015

## 2015-08-29 NOTE — Assessment & Plan Note (Signed)
On fluoxetine.  Stable.

## 2015-08-29 NOTE — Assessment & Plan Note (Signed)
Followed by cardiology.  Stress test 12/14/14 - negative.  Continue risk factor modification.  Has f/u scheduled tomorrow.

## 2015-09-03 ENCOUNTER — Other Ambulatory Visit: Payer: Self-pay | Admitting: Internal Medicine

## 2015-09-16 ENCOUNTER — Telehealth: Payer: Self-pay | Admitting: Internal Medicine

## 2015-09-16 NOTE — Telephone Encounter (Signed)
Please advise if you need assistance with this.

## 2015-09-16 NOTE — Telephone Encounter (Signed)
Error

## 2015-09-16 NOTE — Telephone Encounter (Deleted)
Pt dropped off paper work for Dr. Nicki Reaper to fill out for handicap paper work. Paper work out front.  Thank you,  kp

## 2015-10-07 ENCOUNTER — Other Ambulatory Visit: Payer: Self-pay | Admitting: Internal Medicine

## 2015-10-27 DIAGNOSIS — B351 Tinea unguium: Secondary | ICD-10-CM | POA: Diagnosis not present

## 2015-10-27 DIAGNOSIS — L851 Acquired keratosis [keratoderma] palmaris et plantaris: Secondary | ICD-10-CM | POA: Diagnosis not present

## 2015-10-27 DIAGNOSIS — E119 Type 2 diabetes mellitus without complications: Secondary | ICD-10-CM | POA: Diagnosis not present

## 2015-12-21 ENCOUNTER — Other Ambulatory Visit (INDEPENDENT_AMBULATORY_CARE_PROVIDER_SITE_OTHER): Payer: PPO

## 2015-12-21 DIAGNOSIS — I10 Essential (primary) hypertension: Secondary | ICD-10-CM

## 2015-12-21 DIAGNOSIS — E7800 Pure hypercholesterolemia, unspecified: Secondary | ICD-10-CM | POA: Diagnosis not present

## 2015-12-21 DIAGNOSIS — E119 Type 2 diabetes mellitus without complications: Secondary | ICD-10-CM

## 2015-12-21 LAB — HEPATIC FUNCTION PANEL
ALK PHOS: 57 U/L (ref 39–117)
ALT: 23 U/L (ref 0–53)
AST: 24 U/L (ref 0–37)
Albumin: 4 g/dL (ref 3.5–5.2)
BILIRUBIN DIRECT: 0.1 mg/dL (ref 0.0–0.3)
TOTAL PROTEIN: 6.8 g/dL (ref 6.0–8.3)
Total Bilirubin: 0.6 mg/dL (ref 0.2–1.2)

## 2015-12-21 LAB — LIPID PANEL
Cholesterol: 160 mg/dL (ref 0–200)
HDL: 39.5 mg/dL (ref >39.00)
NONHDL: 120.56
TRIGLYCERIDES: 249 mg/dL — AB (ref 0.0–149.0)
Total CHOL/HDL Ratio: 4
VLDL: 49.8 mg/dL — AB (ref 0.0–40.0)

## 2015-12-21 LAB — BASIC METABOLIC PANEL
BUN: 15 mg/dL (ref 6–23)
CALCIUM: 9.5 mg/dL (ref 8.4–10.5)
CHLORIDE: 103 meq/L (ref 96–112)
CO2: 29 mEq/L (ref 19–32)
CREATININE: 0.97 mg/dL (ref 0.40–1.50)
GFR: 77.98 mL/min (ref >60.00)
Glucose, Bld: 150 mg/dL — ABNORMAL HIGH (ref 70–99)
Potassium: 4.2 mEq/L (ref 3.5–5.1)
Sodium: 139 mEq/L (ref 135–145)

## 2015-12-21 LAB — HEMOGLOBIN A1C: HEMOGLOBIN A1C: 7.3 % — AB (ref 4.6–6.5)

## 2015-12-21 LAB — LDL CHOLESTEROL, DIRECT: LDL DIRECT: 68 mg/dL

## 2015-12-22 ENCOUNTER — Encounter: Payer: Self-pay | Admitting: Internal Medicine

## 2015-12-24 ENCOUNTER — Ambulatory Visit (INDEPENDENT_AMBULATORY_CARE_PROVIDER_SITE_OTHER): Payer: PPO | Admitting: Internal Medicine

## 2015-12-24 ENCOUNTER — Encounter: Payer: Self-pay | Admitting: Internal Medicine

## 2015-12-24 VITALS — BP 136/60 | HR 61 | Temp 97.6°F | Resp 14 | Ht 67.0 in | Wt 184.6 lb

## 2015-12-24 DIAGNOSIS — G43809 Other migraine, not intractable, without status migrainosus: Secondary | ICD-10-CM

## 2015-12-24 DIAGNOSIS — R519 Headache, unspecified: Secondary | ICD-10-CM

## 2015-12-24 DIAGNOSIS — I2510 Atherosclerotic heart disease of native coronary artery without angina pectoris: Secondary | ICD-10-CM | POA: Diagnosis not present

## 2015-12-24 DIAGNOSIS — E119 Type 2 diabetes mellitus without complications: Secondary | ICD-10-CM

## 2015-12-24 DIAGNOSIS — I10 Essential (primary) hypertension: Secondary | ICD-10-CM | POA: Diagnosis not present

## 2015-12-24 DIAGNOSIS — Z658 Other specified problems related to psychosocial circumstances: Secondary | ICD-10-CM

## 2015-12-24 DIAGNOSIS — Z125 Encounter for screening for malignant neoplasm of prostate: Secondary | ICD-10-CM

## 2015-12-24 DIAGNOSIS — F439 Reaction to severe stress, unspecified: Secondary | ICD-10-CM

## 2015-12-24 DIAGNOSIS — E7800 Pure hypercholesterolemia, unspecified: Secondary | ICD-10-CM

## 2015-12-24 DIAGNOSIS — R51 Headache: Secondary | ICD-10-CM

## 2015-12-24 DIAGNOSIS — Z0000 Encounter for general adult medical examination without abnormal findings: Secondary | ICD-10-CM

## 2015-12-24 NOTE — Progress Notes (Signed)
Pre visit review using our clinic review tool, if applicable. No additional management support is needed unless otherwise documented below in the visit note. 

## 2015-12-24 NOTE — Progress Notes (Signed)
Patient ID: Kyle Richardson, male   DOB: 02-03-1928, 82 y.o.   MRN: [id number]   Subjective:    Patient ID: Kyle Richardson, male    DOB: [birth year], 82 y.o.   MRN: [id number]  HPI  Patient with past history of CAD, hypercholesterolemia, hypertension, diabetes and migraine headaches.  He comes in today to follow up on these issues as well as for a complete physical exam.  He tries to stay active.  Has not been as active with the colder weather.  a1c recently increased.  Discussed diet and exercise.  No chest pain or tightness.  No sob.  No abdominal pain or cramping.  Bowels stable.  He does report noticing a pain - posterior head (bilateral).  Notices when he is standing and leaning forward while washing dishes.  Also notices when bend over to tie shoes.  More frequent lately.  When he moves and changes positions, pain resolves.     Past Medical History  Diagnosis Date  . 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  s/p PTCA of the mid RCA 1/96, also  s/p  CABG x 4 7/96  . Hypertension   . Hypercholesterolemia   . Degenerative disc disease   . Migraine headache     history of  . Sleep apnea   . Diabetes mellitus ([place])   . Osteoarthritis   . Glaucoma    Past Surgical History  Procedure Laterality Date  . Coronary artery bypass graft  7/96    x4  . Hernia repair    . Appendectomy    . Upp and tonsillectomy  1/86  . Hemorrhoid surgery    . Cholecystectomy      Removed   Family History  Problem Relation Age of Onset  . Heart disease Mother     died age 51  . Heart disease Father     and other vascular disease  . Diabetes      four siblings  . Heart disease Brother     s/p CABG  . Colon cancer Neg Hx   . Prostate cancer Neg Hx    Social History   Social History  . Marital Status: Married    Spouse Name: N/A  . Number of Children: 2  . Years of Education: N/A   Social History Main Topics  . Smoking status: Never Smoker   . Smokeless  tobacco: Never Used  . Alcohol Use: No  . Drug Use: No  . Sexual Activity: Not Currently   Other Topics Concern  . None   Social History Narrative     Review of Systems  Constitutional: Negative for appetite change and unexpected weight change.  HENT: Negative for congestion and sinus pressure.   Respiratory: Negative for cough, chest tightness and shortness of breath.   Cardiovascular: Negative for chest pain, palpitations and leg swelling.  Gastrointestinal: Negative for nausea, vomiting, abdominal pain and diarrhea.  Genitourinary: Negative for dysuria and difficulty urinating.  Musculoskeletal: Negative for myalgias and joint swelling.  Skin: Negative for color change and rash.  Neurological: Positive for headaches. Negative for dizziness.  Psychiatric/Behavioral: Negative for dysphoric mood and agitation.       Objective:    Physical Exam  Constitutional: He appears well-developed and well-nourished. No distress.  HENT:  Nose: Nose normal.  Mouth/Throat: Oropharynx is clear and moist.  Eyes: Conjunctivae are normal. Right eye exhibits no discharge. Left eye exhibits no discharge.  Neck: Neck supple.  No thyromegaly present.  No neck pain with rotation of his head.    Cardiovascular: Normal rate and regular rhythm.   Pulmonary/Chest: Effort normal and breath sounds normal. No respiratory distress.  Abdominal: Soft. Bowel sounds are normal. There is no tenderness.  Musculoskeletal: He exhibits no edema or tenderness.  Lymphadenopathy:    He has no cervical adenopathy.  Skin: No rash noted. No erythema.  Psychiatric: He has a normal mood and affect. His behavior is normal.    BP 136/60 mmHg  Pulse 61  Temp(Src) 97.6 F (36.4 C) (Oral)  Resp 14  Ht _0  (1.702 m)  Wt 184 lb 9.6 oz (83.734 kg)  BMI 28.91 kg/m2  SpO2 95% Wt Readings from Last 3 Encounters:  12/24/15 184 lb 9.6 oz (83.734 kg)  08/23/15 182 lb (82.555 kg)  08/20/15 180 lb 12.8 oz (82.01 kg)      Lab Results  Component Value Date   WBC 5.1 12/14/2014   HGB 15.5 12/14/2014   HCT 44.7 12/14/2014   PLT 196.0 12/14/2014   GLUCOSE 150* 12/21/2015   CHOL 160 12/21/2015   TRIG 249.0* 12/21/2015   HDL 39.50 12/21/2015   LDLDIRECT 68.0 12/21/2015   LDLCALC 61 07/24/2014   ALT 23 12/21/2015   AST 24 12/21/2015   NA 139 12/21/2015   K 4.2 12/21/2015   CL 103 12/21/2015   CREATININE 0.97 12/21/2015   BUN 15 12/21/2015   CO2 29 12/21/2015   TSH 2.52 04/16/2015   PSA 0.41 04/16/2015   INR 1.4 02/17/2014   HGBA1C 7.3* 12/21/2015   MICROALBUR 4.0* 04/16/2015       Assessment & Plan:   Problem List Items Addressed This Visit    CAD (coronary artery disease)    Followed by cardiology.  Had stress test - 12/14/14 - negative.  Continue risk factor modification.        Diabetes mellitus ([place])    a1c increased on most recent check - 7.3.  Discussed low carb diet and exercise.  Follow met b and a1c.        Relevant Orders   Hemoglobin A1c   Microalbumin / creatinine urine ratio   Basic metabolic panel   Health care maintenance    Physical today 12/24/15.  PSA .41 - 04/16/15.  Colonoscopy 01/30/12 - 55m transverse polyp, redundant colon and thrombosed internal hemorrhoid.        Hypercholesterolemia    On lipitor.  Low cholesterol diet and exercise.  Follow lipid panel and liver function tests.        Relevant Orders   Lipid panel   Hepatic function panel   Hypertension    Blood pressure under good control.  Continue same medication regimen.  Follow pressures.  Follow metabolic panel.        Relevant Orders   CBC with Differential/Platelet   TSH   Migraine    Worked up by neurology.  Has been doing well.  Having the head pain now as outlined.  Notices with certain positions or leaning forward.  Does not appear to have increased neck issues.  Will have his neurologist evaluate with question of need for further w/up.  Follow.        Stress    Doing well on prozac.   Follow.        Other Visit Diagnoses    Nonintractable headache, unspecified chronicity pattern, unspecified headache type    -  Primary    Relevant Orders  Ambulatory referral to Neurology    Prostate cancer screening        Relevant Orders    PSA, Medicare        Einar Pheasant, MD

## 2015-12-26 ENCOUNTER — Encounter: Payer: Self-pay | Admitting: Internal Medicine

## 2015-12-26 NOTE — Assessment & Plan Note (Signed)
Physical today 12/24/15.  PSA .41 - 04/16/15.  Colonoscopy 01/30/12 - 33mm transverse polyp, redundant colon and thrombosed internal hemorrhoid.

## 2015-12-26 NOTE — Assessment & Plan Note (Signed)
On lipitor.  Low cholesterol diet and exercise.  Follow lipid panel and liver function tests.   

## 2015-12-26 NOTE — Assessment & Plan Note (Signed)
Worked up by neurology.  Has been doing well.  Having the head pain now as outlined.  Notices with certain positions or leaning forward.  Does not appear to have increased neck issues.  Will have his neurologist evaluate with question of need for further w/up.  Follow.

## 2015-12-26 NOTE — Assessment & Plan Note (Signed)
Followed by cardiology.  Had stress test - 12/14/14 - negative.  Continue risk factor modification.

## 2015-12-26 NOTE — Assessment & Plan Note (Signed)
Blood pressure under good control.  Continue same medication regimen.  Follow pressures.  Follow metabolic panel.   

## 2015-12-26 NOTE — Assessment & Plan Note (Signed)
Doing well on prozac.  Follow  

## 2015-12-26 NOTE — Assessment & Plan Note (Signed)
a1c increased on most recent check - 7.3.  Discussed low carb diet and exercise.  Follow met b and a1c.

## 2015-12-28 DIAGNOSIS — M25561 Pain in right knee: Secondary | ICD-10-CM | POA: Diagnosis not present

## 2015-12-28 DIAGNOSIS — M25562 Pain in left knee: Secondary | ICD-10-CM | POA: Diagnosis not present

## 2015-12-28 DIAGNOSIS — M170 Bilateral primary osteoarthritis of knee: Secondary | ICD-10-CM | POA: Diagnosis not present

## 2016-01-04 ENCOUNTER — Encounter: Payer: Self-pay | Admitting: Internal Medicine

## 2016-01-25 DIAGNOSIS — M2392 Unspecified internal derangement of left knee: Secondary | ICD-10-CM | POA: Diagnosis not present

## 2016-02-02 ENCOUNTER — Other Ambulatory Visit: Payer: Self-pay | Admitting: Physician Assistant

## 2016-02-02 ENCOUNTER — Other Ambulatory Visit: Payer: Self-pay | Admitting: Internal Medicine

## 2016-02-02 DIAGNOSIS — M2392 Unspecified internal derangement of left knee: Secondary | ICD-10-CM

## 2016-02-02 NOTE — Telephone Encounter (Signed)
Okay to refill? Last refilled on: 10/07/15 #30 with 3 refills.

## 2016-02-03 NOTE — Telephone Encounter (Signed)
ok'd refill for prozac #30 with 2 refills.   

## 2016-02-14 ENCOUNTER — Other Ambulatory Visit: Payer: Self-pay | Admitting: Internal Medicine

## 2016-02-18 ENCOUNTER — Ambulatory Visit
Admission: RE | Admit: 2016-02-18 | Discharge: 2016-02-18 | Disposition: A | Discharge status: Home / Self Care | Payer: PPO | Source: Ambulatory Visit | Attending: Physician Assistant | Admitting: Physician Assistant

## 2016-02-18 DIAGNOSIS — M2392 Unspecified internal derangement of left knee: Secondary | ICD-10-CM | POA: Diagnosis not present

## 2016-02-18 DIAGNOSIS — M2242 Chondromalacia patellae, left knee: Secondary | ICD-10-CM | POA: Insufficient documentation

## 2016-02-18 DIAGNOSIS — M660 Rupture of popliteal cyst: Secondary | ICD-10-CM | POA: Diagnosis not present

## 2016-02-18 DIAGNOSIS — S83232A Complex tear of medial meniscus, current injury, left knee, initial encounter: Secondary | ICD-10-CM | POA: Insufficient documentation

## 2016-02-18 DIAGNOSIS — M25462 Effusion, left knee: Secondary | ICD-10-CM | POA: Diagnosis not present

## 2016-02-18 DIAGNOSIS — S83242A Other tear of medial meniscus, current injury, left knee, initial encounter: Secondary | ICD-10-CM | POA: Diagnosis not present

## 2016-02-21 DIAGNOSIS — E7800 Pure hypercholesterolemia, unspecified: Secondary | ICD-10-CM | POA: Diagnosis not present

## 2016-02-21 DIAGNOSIS — I2510 Atherosclerotic heart disease of native coronary artery without angina pectoris: Secondary | ICD-10-CM | POA: Diagnosis not present

## 2016-02-21 DIAGNOSIS — G4733 Obstructive sleep apnea (adult) (pediatric): Secondary | ICD-10-CM | POA: Diagnosis not present

## 2016-02-21 DIAGNOSIS — E119 Type 2 diabetes mellitus without complications: Secondary | ICD-10-CM | POA: Diagnosis not present

## 2016-02-21 DIAGNOSIS — I10 Essential (primary) hypertension: Secondary | ICD-10-CM | POA: Diagnosis not present

## 2016-02-23 DIAGNOSIS — H401131 Primary open-angle glaucoma, bilateral, mild stage: Secondary | ICD-10-CM | POA: Diagnosis not present

## 2016-02-23 LAB — HM DIABETES EYE EXAM

## 2016-02-24 ENCOUNTER — Encounter: Payer: Self-pay | Admitting: Internal Medicine

## 2016-02-29 ENCOUNTER — Other Ambulatory Visit: Payer: Self-pay | Admitting: Internal Medicine

## 2016-03-09 DIAGNOSIS — S20462A Insect bite (nonvenomous) of left back wall of thorax, initial encounter: Secondary | ICD-10-CM | POA: Diagnosis not present

## 2016-03-09 DIAGNOSIS — Z08 Encounter for follow-up examination after completed treatment for malignant neoplasm: Secondary | ICD-10-CM | POA: Diagnosis not present

## 2016-03-09 DIAGNOSIS — X32XXXA Exposure to sunlight, initial encounter: Secondary | ICD-10-CM | POA: Diagnosis not present

## 2016-03-09 DIAGNOSIS — L570 Actinic keratosis: Secondary | ICD-10-CM | POA: Diagnosis not present

## 2016-03-09 DIAGNOSIS — Z85828 Personal history of other malignant neoplasm of skin: Secondary | ICD-10-CM | POA: Diagnosis not present

## 2016-03-21 DIAGNOSIS — M2392 Unspecified internal derangement of left knee: Secondary | ICD-10-CM | POA: Diagnosis not present

## 2016-05-02 ENCOUNTER — Other Ambulatory Visit: Payer: Self-pay | Admitting: Internal Medicine

## 2016-05-02 NOTE — Telephone Encounter (Signed)
LOV 12/24/2015. Renaldo Fiddler, CMA

## 2016-05-05 ENCOUNTER — Other Ambulatory Visit (INDEPENDENT_AMBULATORY_CARE_PROVIDER_SITE_OTHER): Payer: PPO

## 2016-05-05 DIAGNOSIS — E119 Type 2 diabetes mellitus without complications: Secondary | ICD-10-CM

## 2016-05-05 DIAGNOSIS — Z125 Encounter for screening for malignant neoplasm of prostate: Secondary | ICD-10-CM

## 2016-05-05 DIAGNOSIS — E7800 Pure hypercholesterolemia, unspecified: Secondary | ICD-10-CM

## 2016-05-05 DIAGNOSIS — I10 Essential (primary) hypertension: Secondary | ICD-10-CM | POA: Diagnosis not present

## 2016-05-05 LAB — BASIC METABOLIC PANEL
BUN: 15 mg/dL (ref 6–23)
CHLORIDE: 105 meq/L (ref 96–112)
CO2: 28 meq/L (ref 19–32)
Calcium: 9.2 mg/dL (ref 8.4–10.5)
Creatinine, Ser: 0.81 mg/dL (ref 0.40–1.50)
GFR: 95.93 mL/min (ref >60.00)
Glucose, Bld: 132 mg/dL — ABNORMAL HIGH (ref 70–99)
POTASSIUM: 4.4 meq/L (ref 3.5–5.1)
SODIUM: 138 meq/L (ref 135–145)

## 2016-05-05 LAB — CBC WITH DIFFERENTIAL/PLATELET
BASOS PCT: 0.3 % (ref 0.0–3.0)
Basophils Absolute: 0 10*3/uL (ref 0.0–0.1)
Eosinophils Absolute: 0.2 10*3/uL (ref 0.0–0.7)
Eosinophils Relative: 4.6 % (ref 0.0–5.0)
HCT: 44 % (ref 39.0–52.0)
Hemoglobin: 15.1 g/dL (ref 13.0–17.0)
LYMPHS PCT: 31.5 % (ref 12.0–46.0)
Lymphs Abs: 1.5 10*3/uL (ref 0.7–4.0)
MCHC: 34.2 g/dL (ref 30.0–36.0)
MCV: 94.9 fl (ref 78.0–100.0)
MONO ABS: 0.5 10*3/uL (ref 0.1–1.0)
MONOS PCT: 10.6 % (ref 3.0–12.0)
NEUTROS ABS: 2.5 10*3/uL (ref 1.4–7.7)
NEUTROS PCT: 53 % (ref 43.0–77.0)
PLATELETS: 175 10*3/uL (ref 150.0–400.0)
RBC: 4.64 Mil/uL (ref 4.22–5.81)
RDW: 13.3 % (ref 11.5–15.5)
WBC: 4.7 10*3/uL (ref 4.0–10.5)

## 2016-05-05 LAB — HEPATIC FUNCTION PANEL
ALBUMIN: 4 g/dL (ref 3.5–5.2)
ALK PHOS: 64 U/L (ref 39–117)
ALT: 30 U/L (ref 0–53)
AST: 30 U/L (ref 0–37)
BILIRUBIN DIRECT: 0.2 mg/dL (ref 0.0–0.3)
BILIRUBIN TOTAL: 1 mg/dL (ref 0.2–1.2)
Total Protein: 6.3 g/dL (ref 6.0–8.3)

## 2016-05-05 LAB — LIPID PANEL
CHOLESTEROL: 163 mg/dL (ref 0–200)
HDL: 43.5 mg/dL (ref >39.00)
LDL Cholesterol: 89 mg/dL (ref 0–99)
NonHDL: 119.4
Total CHOL/HDL Ratio: 4
Triglycerides: 152 mg/dL — ABNORMAL HIGH (ref 0.0–149.0)
VLDL: 30.4 mg/dL (ref 0.0–40.0)

## 2016-05-05 LAB — MICROALBUMIN / CREATININE URINE RATIO
Creatinine,U: 124.6 mg/dL
MICROALB/CREAT RATIO: 3 mg/g (ref 0.0–30.0)
Microalb, Ur: 3.7 mg/dL — ABNORMAL HIGH (ref 0.0–1.9)

## 2016-05-05 LAB — TSH: TSH: 2.29 u[IU]/mL (ref 0.35–4.50)

## 2016-05-05 LAB — PSA, MEDICARE: PSA: 0.66 ng/mL (ref 0.10–4.00)

## 2016-05-05 LAB — HEMOGLOBIN A1C: Hgb A1c MFr Bld: 6.9 % — ABNORMAL HIGH (ref 4.6–6.5)

## 2016-05-07 ENCOUNTER — Encounter: Payer: Self-pay | Admitting: Internal Medicine

## 2016-05-08 DIAGNOSIS — M65341 Trigger finger, right ring finger: Secondary | ICD-10-CM | POA: Diagnosis not present

## 2016-05-09 ENCOUNTER — Encounter: Payer: Self-pay | Admitting: Internal Medicine

## 2016-05-09 ENCOUNTER — Ambulatory Visit (INDEPENDENT_AMBULATORY_CARE_PROVIDER_SITE_OTHER): Payer: PPO | Admitting: Internal Medicine

## 2016-05-09 DIAGNOSIS — E119 Type 2 diabetes mellitus without complications: Secondary | ICD-10-CM | POA: Diagnosis not present

## 2016-05-09 DIAGNOSIS — I10 Essential (primary) hypertension: Secondary | ICD-10-CM | POA: Diagnosis not present

## 2016-05-09 DIAGNOSIS — E7800 Pure hypercholesterolemia, unspecified: Secondary | ICD-10-CM

## 2016-05-09 DIAGNOSIS — Z658 Other specified problems related to psychosocial circumstances: Secondary | ICD-10-CM

## 2016-05-09 DIAGNOSIS — I2510 Atherosclerotic heart disease of native coronary artery without angina pectoris: Secondary | ICD-10-CM

## 2016-05-09 DIAGNOSIS — F439 Reaction to severe stress, unspecified: Secondary | ICD-10-CM

## 2016-05-09 DIAGNOSIS — G43809 Other migraine, not intractable, without status migrainosus: Secondary | ICD-10-CM

## 2016-05-09 NOTE — Progress Notes (Signed)
Pre-visit discussion using our clinic review tool. No additional management support is needed unless otherwise documented below in the visit note.  

## 2016-05-09 NOTE — Progress Notes (Signed)
Patient ID: Kyle Richardson, male   DOB: [birth year], 82 y.o.   MRN: [id number]   Subjective:    Patient ID: Kyle Richardson, male    DOB: 1927/11/11, 82 y.o.   MRN: [id number]  HPI  Patient here for a scheduled follow up.  States he is doing relatively well.  Is s/p injection of right fourth finger yesterday.  No chest pain.  No sob.  Headaches better.  Has appt with his neurologist soon.  Also, going to see headache specialist.  We discussed this today.  Even though better, he does want to keep f/u appt.  No acid reflux.  No abdominal pain or cramping.  Bowels stable.    Past Medical History:  Diagnosis Date  . 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  s/p PTCA of the mid RCA 1/96, also  s/p  CABG x 4 7/96  . Degenerative disc disease   . Diabetes mellitus ([place])   . Glaucoma   . Hypercholesterolemia   . Hypertension   . Migraine headache    history of  . Osteoarthritis   . Sleep apnea    Past Surgical History:  Procedure Laterality Date  . APPENDECTOMY    . CHOLECYSTECTOMY     Removed  . CORONARY ARTERY BYPASS GRAFT  7/96   x4  . HEMORRHOID SURGERY    . HERNIA REPAIR    . UPP and tonsillectomy  1/86   Family History  Problem Relation Age of Onset  . Heart disease Mother     died age 21  . Heart disease Father     and other vascular disease  . Diabetes      four siblings  . Heart disease Brother     s/p CABG  . Colon cancer Neg Hx   . Prostate cancer Neg Hx    Social History   Social History  . Marital status: Married    Spouse name: N/A  . Number of children: 2  . Years of education: N/A   Social History Main Topics  . Smoking status: Never Smoker  . Smokeless tobacco: Never Used  . Alcohol use No  . Drug use: No  . Sexual activity: Not Currently   Other Topics Concern  . None   Social History Narrative  . None    Outpatient Encounter Prescriptions as of 05/09/2016  Medication Sig  . ACCU-CHEK SOFTCLIX LANCETS lancets  USE AS DIRECTED  . aspirin 325 MG tablet Take 325 mg by mouth daily.  Marland Kitchen atorvastatin (LIPITOR) 20 MG tablet TAKE ONE (1) TABLET EACH DAY  . calcium citrate-vitamin D (CITRACAL+D) 315-200 MG-UNIT per tablet Take 1 tablet by mouth daily.  . Chlorpheniramine Maleate (ALLERGY RELIEF PO) Take by mouth daily.  Marland Kitchen FLUoxetine (PROZAC) 10 MG capsule TAKE ONE (1) CAPSULE EACH DAY  . fluticasone (FLONASE) 50 MCG/ACT nasal spray TWO PUFFS IN EACH NOSTRIL ONCE A DAY  . glucose blood (ACCU-CHEK AVIVA PLUS) test strip Check blood sugar once daily. Dx 250.00  . latanoprost (XALATAN) 0.005 % ophthalmic solution Place 1 drop into both eyes at bedtime.  . Multiple Vitamin (MULTIVITAMIN) tablet Take 1 tablet by mouth daily.  . Multiple Vitamins-Minerals (PRESERVISION AREDS 2) CAPS Take by mouth daily.  . mupirocin ointment (BACTROBAN) 2 % Apply to affected areas bid  . nitroGLYCERIN (NITROSTAT) 0.4 MG SL tablet Place 0.4 mg under the tongue every 5 (five) minutes as needed. As directed  . pantoprazole (PROTONIX) 40  MG tablet Take 40 mg by mouth daily.   No facility-administered encounter medications on file as of 05/09/2016.     Review of Systems  Constitutional: Negative for appetite change and unexpected weight change.  HENT: Negative for congestion and sinus pressure.   Respiratory: Negative for cough, chest tightness and shortness of breath.   Cardiovascular: Negative for chest pain, palpitations and leg swelling.  Gastrointestinal: Negative for abdominal pain, diarrhea, nausea and vomiting.  Genitourinary: Negative for difficulty urinating and dysuria.  Musculoskeletal: Negative for back pain and joint swelling.       S/p finger injection.    Skin: Negative for color change and rash.  Neurological: Negative for dizziness, light-headedness and headaches.  Psychiatric/Behavioral: Negative for agitation and dysphoric mood.       Objective:    Physical Exam  Constitutional: He appears well-developed  and well-nourished. No distress.  HENT:  Nose: Nose normal.  Mouth/Throat: Oropharynx is clear and moist.  Neck: Neck supple. No thyromegaly present.  Cardiovascular: Normal rate and regular rhythm.   Pulmonary/Chest: Effort normal and breath sounds normal. No respiratory distress.  Abdominal: Soft. Bowel sounds are normal. There is no tenderness.  Musculoskeletal: He exhibits no edema or tenderness.  Lymphadenopathy:    He has no cervical adenopathy.  Skin: No rash noted. No erythema.  Psychiatric: He has a normal mood and affect. His behavior is normal.    BP 128/70 (BP Location: Left Arm, Patient Position: Sitting, Cuff Size: Normal)   Pulse 71   Temp 98 F (36.7 C) (Oral)   Resp 18   Ht '5\' 7"'  (1.702 m)   Wt 175 lb 6 oz (79.5 kg)   SpO2 97%   BMI 27.47 kg/m  Wt Readings from Last 3 Encounters:  05/09/16 175 lb 6 oz (79.5 kg)  12/24/15 184 lb 9.6 oz (83.7 kg)  08/23/15 182 lb (82.6 kg)     Lab Results  Component Value Date   WBC 4.7 05/05/2016   HGB 15.1 05/05/2016   HCT 44.0 05/05/2016   PLT 175.0 05/05/2016   GLUCOSE 132 (H) 05/05/2016   CHOL 163 05/05/2016   TRIG 152.0 (H) 05/05/2016   HDL 43.50 05/05/2016   LDLDIRECT 68.0 12/21/2015   LDLCALC 89 05/05/2016   ALT 30 05/05/2016   AST 30 05/05/2016   NA 138 05/05/2016   K 4.4 05/05/2016   CL 105 05/05/2016   CREATININE 0.81 05/05/2016   BUN 15 05/05/2016   CO2 28 05/05/2016   TSH 2.29 05/05/2016   PSA 0.66 05/05/2016   INR 1.4 02/17/2014   HGBA1C 6.9 (H) 05/05/2016   MICROALBUR 3.7 (H) 05/05/2016    Mr Knee Left  Wo Contrast  Result Date: 02/18/2016 CLINICAL DATA:  Left knee pain for 1 month. EXAM: MRI OF THE LEFT KNEE WITHOUT CONTRAST TECHNIQUE: Multiplanar, multisequence MR imaging of the knee was performed. No intravenous contrast was administered. COMPARISON:  None. FINDINGS: MENISCI Medial meniscus: There is a complex tear of the posterior horn of the medial meniscus at the posterior medial corner  extending into the undersurface of the midbody. Lateral meniscus:  Slight fraying of the free edge of the midbody. LIGAMENTS Cruciates:  Normal. Collaterals: Normal. There is edema superficial and deep to the MCL, prior related to the meniscal tear. CARTILAGE Patellofemoral: Fraying of the cartilage of the apex and medial facet of the patella. Medial: Irregularity of the articular cartilage of the femoral condyle and tibial plateau. Lateral:  Normal. Joint:  Small joint effusion.  Popliteal Fossa: Ruptured Baker's cyst with fluid extending down the posterior medial aspect of the knee into the lower leg. Extensor Mechanism:  Normal. Bones:  Normal. IMPRESSION: 1. Complex tear of the posterior horn and midbody of the medial meniscus with edema in the overlying soft tissues. 2. Small joint effusion with ruptured Baker's cyst. 3. 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 Electronically Signed   By: Lorriane Shire M.D.   On: 02/18/2016 14:09       Assessment & Plan:   Problem List Items Addressed This Visit    CAD (coronary artery disease)    Followed by cardiology.  Stress test 12/2014 - negative.  Continue risk factor modification.        Diabetes mellitus ([place])    Improved on recent check - 6.9.  Discussed low carb diet and exercise.  Follow met b and a1c.        Relevant Orders   Hemoglobin J5Y   Basic metabolic panel   Hypercholesterolemia    On lipitor.  Low cholesterol diet and exercise.  Follow lipid panel and liver function tests.   Lab Results  Component Value Date   CHOL 163 05/05/2016   HDL 43.50 05/05/2016   LDLCALC 89 05/05/2016   LDLDIRECT 68.0 12/21/2015   TRIG 152.0 (H) 05/05/2016   CHOLHDL 4 05/05/2016        Relevant Orders   Lipid panel   Hepatic function panel   Hypertension    Blood pressure under good control.  Continue same medication regimen.  Follow pressures.  Follow metabolic panel.        Migraine    Has previously been worked up by  neurology.  No headaches now.  Has f/u planned with neurology.        Stress    Doing well on prozac.  Follow.        Other Visit Diagnoses   None.      Einar Pheasant, MD

## 2016-05-14 ENCOUNTER — Encounter: Payer: Self-pay | Admitting: Internal Medicine

## 2016-05-14 NOTE — Assessment & Plan Note (Signed)
Blood pressure under good control.  Continue same medication regimen.  Follow pressures.  Follow metabolic panel.   

## 2016-05-14 NOTE — Assessment & Plan Note (Signed)
On lipitor.  Low cholesterol diet and exercise.  Follow lipid panel and liver function tests.   Lab Results  Component Value Date   CHOL 163 05/05/2016   HDL 43.50 05/05/2016   LDLCALC 89 05/05/2016   LDLDIRECT 68.0 12/21/2015   TRIG 152.0 (H) 05/05/2016   CHOLHDL 4 05/05/2016

## 2016-05-14 NOTE — Assessment & Plan Note (Signed)
Followed by cardiology.  Stress test 12/2014 - negative.  Continue risk factor modification.

## 2016-05-14 NOTE — Assessment & Plan Note (Signed)
Has previously been worked up by neurology.  No headaches now.  Has f/u planned with neurology.

## 2016-05-14 NOTE — Assessment & Plan Note (Signed)
Improved on recent check - 6.9.  Discussed low carb diet and exercise.  Follow met b and a1c.

## 2016-05-14 NOTE — Assessment & Plan Note (Signed)
Doing well on prozac.  Follow  

## 2016-06-05 DIAGNOSIS — R51 Headache: Secondary | ICD-10-CM | POA: Diagnosis not present

## 2016-06-05 DIAGNOSIS — R202 Paresthesia of skin: Secondary | ICD-10-CM | POA: Diagnosis not present

## 2016-06-05 DIAGNOSIS — I6930 Unspecified sequelae of cerebral infarction: Secondary | ICD-10-CM | POA: Diagnosis not present

## 2016-06-05 DIAGNOSIS — G43109 Migraine with aura, not intractable, without status migrainosus: Secondary | ICD-10-CM | POA: Diagnosis not present

## 2016-06-06 ENCOUNTER — Other Ambulatory Visit: Payer: Self-pay | Admitting: Internal Medicine

## 2016-06-06 DIAGNOSIS — N400 Enlarged prostate without lower urinary tract symptoms: Secondary | ICD-10-CM

## 2016-06-06 DIAGNOSIS — Z87442 Personal history of urinary calculi: Secondary | ICD-10-CM

## 2016-06-14 DIAGNOSIS — R3914 Feeling of incomplete bladder emptying: Secondary | ICD-10-CM | POA: Diagnosis not present

## 2016-06-14 DIAGNOSIS — E291 Testicular hypofunction: Secondary | ICD-10-CM | POA: Diagnosis not present

## 2016-06-14 DIAGNOSIS — R350 Frequency of micturition: Secondary | ICD-10-CM | POA: Diagnosis not present

## 2016-06-14 DIAGNOSIS — R3915 Urgency of urination: Secondary | ICD-10-CM | POA: Diagnosis not present

## 2016-06-14 DIAGNOSIS — A401 Sepsis due to streptococcus, group B: Secondary | ICD-10-CM | POA: Diagnosis not present

## 2016-06-14 DIAGNOSIS — N401 Enlarged prostate with lower urinary tract symptoms: Secondary | ICD-10-CM | POA: Diagnosis not present

## 2016-06-19 ENCOUNTER — Ambulatory Visit (INDEPENDENT_AMBULATORY_CARE_PROVIDER_SITE_OTHER): Payer: PPO

## 2016-06-19 DIAGNOSIS — Z23 Encounter for immunization: Secondary | ICD-10-CM

## 2016-06-20 DIAGNOSIS — N401 Enlarged prostate with lower urinary tract symptoms: Secondary | ICD-10-CM | POA: Diagnosis not present

## 2016-06-22 DIAGNOSIS — N401 Enlarged prostate with lower urinary tract symptoms: Secondary | ICD-10-CM | POA: Diagnosis not present

## 2016-06-22 DIAGNOSIS — R3914 Feeling of incomplete bladder emptying: Secondary | ICD-10-CM | POA: Diagnosis not present

## 2016-06-22 DIAGNOSIS — E291 Testicular hypofunction: Secondary | ICD-10-CM | POA: Diagnosis not present

## 2016-06-22 DIAGNOSIS — R350 Frequency of micturition: Secondary | ICD-10-CM | POA: Diagnosis not present

## 2016-06-26 DIAGNOSIS — E291 Testicular hypofunction: Secondary | ICD-10-CM | POA: Diagnosis not present

## 2016-06-26 DIAGNOSIS — R3914 Feeling of incomplete bladder emptying: Secondary | ICD-10-CM | POA: Diagnosis not present

## 2016-06-26 DIAGNOSIS — R350 Frequency of micturition: Secondary | ICD-10-CM | POA: Diagnosis not present

## 2016-06-26 DIAGNOSIS — N401 Enlarged prostate with lower urinary tract symptoms: Secondary | ICD-10-CM | POA: Diagnosis not present

## 2016-06-29 ENCOUNTER — Other Ambulatory Visit: Payer: Self-pay | Admitting: Internal Medicine

## 2016-07-13 ENCOUNTER — Other Ambulatory Visit: Payer: Self-pay | Admitting: Internal Medicine

## 2016-07-13 ENCOUNTER — Telehealth: Payer: Self-pay

## 2016-07-13 NOTE — Telephone Encounter (Signed)
Ok

## 2016-07-13 NOTE — Telephone Encounter (Signed)
Patients insurance only covers onetouch or freestyle, ok to change?

## 2016-07-13 NOTE — Telephone Encounter (Signed)
Please write rx

## 2016-07-14 NOTE — Telephone Encounter (Signed)
faxed

## 2016-07-14 NOTE — Telephone Encounter (Signed)
rx written for glucometer (One Touch).  rx placed in your box.

## 2016-08-14 ENCOUNTER — Emergency Department
Admission: EM | Admit: 2016-08-14 | Discharge: 2016-08-14 | Disposition: A | Discharge status: Home / Self Care | Payer: PPO | Attending: Emergency Medicine | Admitting: Emergency Medicine

## 2016-08-14 ENCOUNTER — Emergency Department: Payer: PPO

## 2016-08-14 DIAGNOSIS — R000 Tachycardia, unspecified: Secondary | ICD-10-CM | POA: Diagnosis not present

## 2016-08-14 DIAGNOSIS — J9811 Atelectasis: Secondary | ICD-10-CM | POA: Diagnosis not present

## 2016-08-14 DIAGNOSIS — I10 Essential (primary) hypertension: Secondary | ICD-10-CM | POA: Diagnosis not present

## 2016-08-14 DIAGNOSIS — Z7982 Long term (current) use of aspirin: Secondary | ICD-10-CM | POA: Insufficient documentation

## 2016-08-14 DIAGNOSIS — R0789 Other chest pain: Secondary | ICD-10-CM | POA: Diagnosis not present

## 2016-08-14 DIAGNOSIS — I480 Paroxysmal atrial fibrillation: Secondary | ICD-10-CM | POA: Diagnosis not present

## 2016-08-14 DIAGNOSIS — Z79899 Other long term (current) drug therapy: Secondary | ICD-10-CM | POA: Insufficient documentation

## 2016-08-14 DIAGNOSIS — I2510 Atherosclerotic heart disease of native coronary artery without angina pectoris: Secondary | ICD-10-CM | POA: Insufficient documentation

## 2016-08-14 DIAGNOSIS — R079 Chest pain, unspecified: Secondary | ICD-10-CM

## 2016-08-14 DIAGNOSIS — E119 Type 2 diabetes mellitus without complications: Secondary | ICD-10-CM | POA: Insufficient documentation

## 2016-08-14 LAB — TROPONIN I

## 2016-08-14 LAB — BASIC METABOLIC PANEL
ANION GAP: 7 (ref 5–15)
BUN: 11 mg/dL (ref 6–20)
CALCIUM: 9.3 mg/dL (ref 8.9–10.3)
CO2: 25 mmol/L (ref 22–32)
Chloride: 105 mmol/L (ref 101–111)
Creatinine, Ser: 0.91 mg/dL (ref 0.61–1.24)
Glucose, Bld: 168 mg/dL — ABNORMAL HIGH (ref 65–99)
Potassium: 4.3 mmol/L (ref 3.5–5.1)
SODIUM: 137 mmol/L (ref 135–145)

## 2016-08-14 LAB — CBC
HCT: 49.1 % (ref 40.0–52.0)
HEMOGLOBIN: 16.7 g/dL (ref 13.0–18.0)
MCH: 32.5 pg (ref 26.0–34.0)
MCHC: 33.9 g/dL (ref 32.0–36.0)
MCV: 95.7 fL (ref 80.0–100.0)
PLATELETS: 166 10*3/uL (ref 150–440)
RBC: 5.13 MIL/uL (ref 4.40–5.90)
RDW: 13.1 % (ref 11.5–14.5)
WBC: 6 10*3/uL (ref 3.8–10.6)

## 2016-08-14 NOTE — Discharge Instructions (Signed)
You were evaluated for chest discomfort and found to have episode of atrial fibrillation, but the heart is back in sinus rhythm.  Your examine evaluation are reassuring in the emergency department today. After speaking with your cardiologist, Dr. Saralyn Pilar, he recommended to see you in the office. Please call the office to make the appointment for tomorrow.  Return to the emergency room for any worsening symptoms including chest pain or trouble breathing, shortness breath, dizziness or passing out, or any other symptoms concerning to you.

## 2016-08-14 NOTE — ED Triage Notes (Addendum)
Per EMS, pt woke up this am with right sided chest pressure. When EMS arrived to pt he was in Afib but converted to NSR. Pt denies hx of Afib but has hx of MI and CABG in 1996. Pt also reported being lightheaded when having the chest pressure. Pt was given 324mg  chewable aspirin by EMS. Pt in NAD at this time and denies pain or pressure.

## 2016-08-14 NOTE — ED Notes (Signed)
Patient transported to X-ray 

## 2016-08-14 NOTE — ED Provider Notes (Signed)
[facility] Emergency Department Provider Note ____________________________________________   I have reviewed the triage vital signs and the triage nursing note.  HISTORY  Chief Complaint Chest Pain   Historian Patient and daughter at the bedside  HPI Kyle Richardson is a 82 y.o. male with a history of CAD, cabg about 20 years ago, presents today after waking up around 9 AM feeling a right sided chest pressure. He's never taken nitroglycerin before but he did today and the discomfort seemed to ease off but he did call 911 and when the first responders got there they took an EKG that showed atrial fibrillation around 109 bpm. He's never known to have had atrial fibrillation in the past. Currently symptoms are gone. No nausea or sweating. No recent illnesses. No coughing or trouble breathing or fevers. He states about 3 weeks ago he had an episode that may have lasted about an hour where he felt dizzy and kind of nauseated and may have had a little bit of chest discomfort which while limits on and he did not seek evaluation for that. He states that he follows with cardiology with Dr. Saralyn Pilar, and typically has a stress test about once per year.    Past Medical History:  Diagnosis Date  . 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  s/p PTCA of the mid RCA 1/96, also  s/p  CABG x 4 7/96  . Degenerative disc disease   . Diabetes mellitus ([place])   . Glaucoma   . Hypercholesterolemia   . Hypertension   . Migraine headache    history of  . Osteoarthritis   . Sleep apnea     Patient Active Problem List   Diagnosis Date Noted  . Light headedness 04/19/2015  . Leg laceration 04/05/2015  . Hematoma 04/05/2015  . Health care maintenance 12/26/2014  . Stress 07/05/2014  . Migraine 05/10/2013  . Diabetes mellitus ([place]) 08/20/2012  . Hypertension 08/20/2012  . Hypercholesterolemia 08/20/2012  . CAD (coronary artery disease) 08/20/2012     Past Surgical History:  Procedure Laterality Date  . APPENDECTOMY    . CHOLECYSTECTOMY     Removed  . CORONARY ARTERY BYPASS GRAFT  7/96   x4  . HEMORRHOID SURGERY    . HERNIA REPAIR    . UPP and tonsillectomy  1/86    Prior to Admission medications   Medication Sig Start Date End Date Taking? Authorizing Provider  ACCU-CHEK AVIVA PLUS test strip CHECK BLOOD SUGAR ONCE DAILY. 07/13/16   Einar Pheasant, MD  ACCU-CHEK SOFTCLIX LANCETS lancets USE AS DIRECTED 02/26/14   Einar Pheasant, MD  aspirin 325 MG tablet Take 325 mg by mouth daily.    Historical Provider, MD  atorvastatin (LIPITOR) 20 MG tablet TAKE 1 TABLET BY MOUTH EVERY DAY. 06/29/16   Einar Pheasant, MD  calcium citrate-vitamin D (CITRACAL+D) 315-200 MG-UNIT per tablet Take 1 tablet by mouth daily.    Historical Provider, MD  Chlorpheniramine Maleate (ALLERGY RELIEF PO) Take by mouth daily.    Historical Provider, MD  FLUoxetine (PROZAC) 10 MG capsule TAKE ONE (1) CAPSULE EACH DAY 05/03/16   Einar Pheasant, MD  fluticasone (FLONASE) 50 MCG/ACT nasal spray TWO PUFFS IN EACH NOSTRIL ONCE A DAY 02/14/16   Einar Pheasant, MD  latanoprost (XALATAN) 0.005 % ophthalmic solution Place 1 drop into both eyes at bedtime.    Historical Provider, MD  Multiple Vitamin (MULTIVITAMIN) tablet Take 1 tablet by mouth daily.  Historical Provider, MD  Multiple Vitamins-Minerals (PRESERVISION AREDS 2) CAPS Take by mouth daily.    Historical Provider, MD  mupirocin ointment (BACTROBAN) 2 % Apply to affected areas bid 04/05/15   Einar Pheasant, MD  nitroGLYCERIN (NITROSTAT) 0.4 MG SL tablet Place 0.4 mg under the tongue every 5 (five) minutes as needed. As directed    Historical Provider, MD  pantoprazole (PROTONIX) 40 MG tablet Take 40 mg by mouth daily.    Historical Provider, MD    Allergies  Allergen Reactions  . Codeine   . Flomax [Tamsulosin Hcl]   . Lamisil [Terbinafine Hcl]   . Sulfa Antibiotics     Family History  Problem Relation  Age of Onset  . Heart disease Mother     died age 17  . Heart disease Father     and other vascular disease  . Diabetes      four siblings  . Heart disease Brother     s/p CABG  . Colon cancer Neg Hx   . Prostate cancer Neg Hx     Social History Social History  Substance Use Topics  . Smoking status: Never Smoker  . Smokeless tobacco: Never Used  . Alcohol use No    Review of Systems  Constitutional: Negative for fever. Eyes: Negative for visual changes. ENT: Negative for sore throat. Cardiovascular: Positive for chest pain. Respiratory: Negative for shortness of breath. Gastrointestinal: Negative for abdominal pain, vomiting and diarrhea. Genitourinary: Negative for dysuria. Musculoskeletal: Negative for back pain. Skin: Negative for rash. Neurological: Negative for headache. 10 point Review of Systems otherwise negative ____________________________________________   PHYSICAL EXAM:  VITAL SIGNS: ED Triage Vitals  Enc Vitals Group     BP 08/14/16 1157 131/70     Pulse Rate 08/14/16 1157 70     Resp 08/14/16 1157 15     Temp 08/14/16 1157 97.7 F (36.5 C)     Temp Source 08/14/16 1157 Oral     SpO2 08/14/16 1157 95 %     Weight 08/14/16 1202 172 lb (78 kg)     Height 08/14/16 1202 5\' 7"  (1.702 m)     Head Circumference --      Peak Flow --      Pain Score --      Pain Loc --      Pain Edu? --      Excl. in [place]? --      Constitutional: Alert and oriented. Well appearing and in no distress. HEENT   Head: Normocephalic and atraumatic.      Eyes: Conjunctivae are normal. PERRL. Normal extraocular movements.      Ears:         Nose: No congestion/rhinnorhea.   Mouth/Throat: Mucous membranes are moist.   Neck: No stridor. Cardiovascular/Chest: Normal rate, regular rhythm.  No murmurs, rubs, or gallops. Respiratory: Normal respiratory effort without tachypnea nor retractions. Breath sounds are clear and equal bilaterally. No  wheezes/rales/rhonchi. Gastrointestinal: Soft. No distention, no guarding, no rebound. Nontender.   Genitourinary/rectal:Deferred Musculoskeletal: Nontender with normal range of motion in all extremities. No joint effusions.  No lower extremity tenderness.  No edema. Neurologic:  Normal speech and language. No gross or focal neurologic deficits are appreciated. Skin:  Skin is warm, dry and intact. No rash noted. Psychiatric: Mood and affect are normal. Speech and behavior are normal. Patient exhibits appropriate insight and judgment.   ____________________________________________  LABS (pertinent positives/negatives)  Labs Reviewed  BASIC METABOLIC PANEL - Abnormal; Notable for the  following:       Result Value   Glucose, Bld 168 (*)    All other components within normal limits  CBC  TROPONIN I  TROPONIN I    ____________________________________________    EKG I, Lisa Roca, MD, the attending physician have personally viewed and interpreted all ECGs.  71 bpm. Normal sinus rhythm. Incomplete right bundle-branch block. Normal ST and T-wave ____________________________________________  RADIOLOGY All Xrays were viewed by me. Imaging interpreted by Radiologist.  Chest x-ray two-view:  IMPRESSION: Slight left base atelectasis. No edema or consolidation. There is aortic atherosclerosis. Patient is status post coronary artery bypass grafting. __________________________________________  PROCEDURES  Procedure(s) performed: None  Critical Care performed: None  ____________________________________________   ED COURSE / ASSESSMENT AND PLAN  Pertinent labs & imaging results that were available during my care of the patient were reviewed by me and considered in my medical decision making (see chart for details).   Mr. Roni Bread came in for chest discomfort, and first responder EKG was printed and does show atrial fibrillation with a heart rate around 109 with occasional PVC.  Currently the patient is in normal sinus rhythm with a heart rate around 70 and good blood pressure and a symptomatic. It seems his discomfort was related to the episode of A. fib and may have even been higher heart rate before EMS got there.  Based on history, perhaps he had an episode about 3 weeks ago. He takes 325 aspirin no other additional blood thinners. He states heart rate is normally "low" and he is not on any blood pressure medication.  Well-appearing normal exam now. EKG and troponin are reassuring. Chest x-ray is reassuring.  Repeat troponin negative. No additional episodes.   CONSULTATIONS:   Dr. Saralyn Pilar, patient's cardiologist recommends outpatient follow-up.   Patient / Family / Caregiver informed of clinical course, medical decision-making process, and agree with plan.   I discussed return precautions, follow-up instructions, and discharge instructions with patient and/or family.   ___________________________________________   FINAL CLINICAL IMPRESSION(S) / ED DIAGNOSES   Final diagnoses:  Paroxysmal atrial fibrillation ([place])  Nonspecific chest pain              Note: This dictation was prepared with Dragon dictation. Any transcriptional errors that result from this process are unintentional    Lisa Roca, MD 08/14/16 1527

## 2016-08-15 DIAGNOSIS — E119 Type 2 diabetes mellitus without complications: Secondary | ICD-10-CM | POA: Diagnosis not present

## 2016-08-15 DIAGNOSIS — I480 Paroxysmal atrial fibrillation: Secondary | ICD-10-CM | POA: Diagnosis not present

## 2016-08-15 DIAGNOSIS — E7800 Pure hypercholesterolemia, unspecified: Secondary | ICD-10-CM | POA: Diagnosis not present

## 2016-08-15 DIAGNOSIS — G4733 Obstructive sleep apnea (adult) (pediatric): Secondary | ICD-10-CM | POA: Diagnosis not present

## 2016-08-15 DIAGNOSIS — I639 Cerebral infarction, unspecified: Secondary | ICD-10-CM | POA: Diagnosis not present

## 2016-08-15 DIAGNOSIS — I10 Essential (primary) hypertension: Secondary | ICD-10-CM | POA: Diagnosis not present

## 2016-08-15 DIAGNOSIS — I2510 Atherosclerotic heart disease of native coronary artery without angina pectoris: Secondary | ICD-10-CM | POA: Diagnosis not present

## 2016-08-17 DIAGNOSIS — I480 Paroxysmal atrial fibrillation: Secondary | ICD-10-CM | POA: Diagnosis not present

## 2016-08-21 ENCOUNTER — Ambulatory Visit (INDEPENDENT_AMBULATORY_CARE_PROVIDER_SITE_OTHER): Payer: PPO

## 2016-08-21 VITALS — BP 130/62 | HR 68 | Temp 97.5°F | Resp 14 | Ht 67.0 in | Wt 173.1 lb

## 2016-08-21 DIAGNOSIS — Z0000 Encounter for general adult medical examination without abnormal findings: Secondary | ICD-10-CM

## 2016-08-21 DIAGNOSIS — E119 Type 2 diabetes mellitus without complications: Secondary | ICD-10-CM | POA: Diagnosis not present

## 2016-08-21 DIAGNOSIS — B351 Tinea unguium: Secondary | ICD-10-CM | POA: Diagnosis not present

## 2016-08-21 NOTE — Patient Instructions (Addendum)
  [name] , Thank you for taking time to come for your Medicare Wellness Visit. I appreciate your ongoing commitment to your health goals. Please review the following plan we discussed and let me know if I can assist you in the future.   FOLLOW UP WITH DR. Nicki Reaper AS NEEDED.  These are the goals we discussed: Goals    . Increase physical activity          Fit bit 10,000 steps daily       This is a list of the screening recommended for you and due dates:  Health Maintenance  Topic Date Due  . Shingles Vaccine  11/13/1989  . Complete foot exam   05/07/2014  . Pneumonia vaccines (2 of 2 - PPSV23) 01/05/2016  . Hemoglobin A1C  11/05/2016  . Eye exam for diabetics  02/22/2017  . Urine Protein Check  05/05/2017  . Tetanus Vaccine  06/28/2025  . Flu Shot  Completed

## 2016-08-21 NOTE — Progress Notes (Signed)
Subjective:   Kyle Richardson is a 82 y.o. male who presents for Medicare Annual/Subsequent preventive examination.  Review of Systems:No ROS.  Medicare Wellness Visit.  Cardiac Risk Factors include: advanced age (>19men, >66 women);diabetes mellitus;hypertension;male gender     Objective:    Vitals: BP 130/62 (BP Location: Left Arm, Patient Position: Sitting, Cuff Size: Normal)   Pulse 68   Temp 97.5 F (36.4 C) (Oral)   Resp 14   Ht 5\' 7"  (1.702 m)   Wt 173 lb 1.9 oz (78.5 kg)   SpO2 96%   BMI 27.11 kg/m   Body mass index is 27.11 kg/m.  Tobacco History  Smoking Status  . Never Smoker  Smokeless Tobacco  . Never Used     Counseling given: Not Answered   Past Medical History:  Diagnosis Date  . 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  s/p PTCA of the mid RCA 1/96, also  s/p  CABG x 4 7/96  . Degenerative disc disease   . Diabetes mellitus ([place])   . Glaucoma   . Hypercholesterolemia   . Hypertension   . Migraine headache    history of  . Osteoarthritis   . Sleep apnea    Past Surgical History:  Procedure Laterality Date  . APPENDECTOMY    . CHOLECYSTECTOMY     Removed  . CORONARY ARTERY BYPASS GRAFT  7/96   x4  . HEMORRHOID SURGERY    . HERNIA REPAIR    . UPP and tonsillectomy  1/86   Family History  Problem Relation Age of Onset  . Heart disease Mother     died age 10  . Heart disease Father     and other vascular disease  . Diabetes      four siblings  . Heart disease Brother     s/p CABG  . Colon cancer Neg Hx   . Prostate cancer Neg Hx    History  Sexual Activity  . Sexual activity: Not Currently    Outpatient Encounter Prescriptions as of 08/21/2016  Medication Sig  . ACCU-CHEK AVIVA PLUS test strip CHECK BLOOD SUGAR ONCE DAILY.  Marland Kitchen ACCU-CHEK SOFTCLIX LANCETS lancets USE AS DIRECTED  . apixaban (ELIQUIS) 5 MG TABS tablet Take by mouth.  Marland Kitchen atorvastatin (LIPITOR) 20 MG tablet TAKE 1 TABLET BY MOUTH  EVERY DAY.  . calcium citrate-vitamin D (CITRACAL+D) 315-200 MG-UNIT per tablet Take 1 tablet by mouth daily.  . Chlorpheniramine Maleate (ALLERGY RELIEF PO) Take by mouth daily.  Marland Kitchen FLUoxetine (PROZAC) 10 MG capsule TAKE ONE (1) CAPSULE EACH DAY  . fluticasone (FLONASE) 50 MCG/ACT nasal spray TWO PUFFS IN EACH NOSTRIL ONCE A DAY  . latanoprost (XALATAN) 0.005 % ophthalmic solution Place 1 drop into both eyes at bedtime.  . Multiple Vitamin (MULTIVITAMIN) tablet Take 1 tablet by mouth daily.  . Multiple Vitamins-Minerals (PRESERVISION AREDS 2) CAPS Take by mouth daily.  . mupirocin ointment (BACTROBAN) 2 % Apply to affected areas bid  . nitroGLYCERIN (NITROSTAT) 0.4 MG SL tablet Place 0.4 mg under the tongue every 5 (five) minutes as needed. As directed  . pantoprazole (PROTONIX) 40 MG tablet Take 40 mg by mouth daily.  . [DISCONTINUED] aspirin 325 MG tablet Take 325 mg by mouth daily.   No facility-administered encounter medications on file as of 08/21/2016.     Activities of Daily Living In your present state of health, do you have any difficulty performing the following activities: 08/21/2016  Hearing? Y  Vision? N  Difficulty concentrating or making decisions? N  Walking or climbing stairs? N  Dressing or bathing? N  Doing errands, shopping? N  Preparing Food and eating ? N  Using the Toilet? N  In the past six months, have you accidently leaked urine? Y  Do you have problems with loss of bowel control? N  Managing your Medications? N  Managing your Finances? N  Housekeeping or managing your Housekeeping? N  Some recent data might be hidden    Patient Care Team: Einar Pheasant, MD as PCP - General (Internal Medicine)   Assessment:    This is a routine wellness examination for Kyle Richardson. The goal of the wellness visit is to assist the patient how to close the gaps in care and create a preventative care plan for the patient.   Taking calcium VIT D as  appropriate/Osteoporosis  reviewed.  Medications reviewed; taking without issues or barriers.  Safety issues reviewed; lives with wife and son.  Smoke detectors in the home. No firearms in the home. Wears seatbelts when driving or riding with others. No violence in the home.  No identified risk were noted; The patient was oriented x 3; appropriate in dress and manner and no objective failures at ADL's or IADL's.   Body mas index; discussed the importance of a healthy diet, water intake and exercise. Educational material provided.   Patient Concerns: None at this time. Follow up with PCP as needed.  Exercise Activities and Dietary recommendations Current Exercise Habits: Home exercise routine, Intensity: Mild  Goals    . Increase physical activity          Fit bit 10,000 steps daily      Fall Risk Fall Risk  08/21/2016 08/20/2015 12/17/2014 02/03/2013 02/03/2013  Falls in the past year? No No No No No   Depression Screen PHQ 2/9 Scores 08/21/2016 08/20/2015 12/17/2014 02/03/2013  PHQ - 2 Score 0 0 0 0    Cognitive Function MMSE - Mini Mental State Exam 08/21/2016 08/20/2015  Orientation to time 5 5  Orientation to Place 5 5  Registration 3 3  Attention/ Calculation 5 5  Recall 3 3  Language- name 2 objects 2 2  Language- repeat 1 1  Language- follow 3 step command 3 3  Language- read & follow direction 1 1  Write a sentence 1 1  Copy design 1 1  Total score 30 30     6CIT Screen 08/21/2016  What Year? 0 points  What month? 0 points  What time? 0 points  Count back from 20 0 points  Months in reverse 0 points    Immunization History  Administered Date(s) Administered  . Influenza Split 08/19/2009  . Influenza, High Dose Seasonal PF 06/19/2016  . Influenza,inj,Quad PF,36+ Mos 07/03/2013, 07/03/2014, 06/03/2015  . Pneumococcal Conjugate-13 08/13/1996, 07/09/2001, 07/09/2006, 01/05/2015  . Tdap 06/29/2015   Screening Tests Health Maintenance  Topic Date  Due  . ZOSTAVAX  11/13/1989  . FOOT EXAM  05/07/2014  . PNA vac Low Risk Adult (2 of 2 - PPSV23) 01/05/2016  . HEMOGLOBIN A1C  11/05/2016  . OPHTHALMOLOGY EXAM  02/22/2017  . URINE MICROALBUMIN  05/05/2017  . TETANUS/TDAP  06/28/2025  . INFLUENZA VACCINE  Completed      Plan:    End of life planning; Advance aging; Advanced directives discussed. Copy of current HCPOA/Living Will requested.  Medicare Attestation I have personally reviewed: The patient's medical and social history Their use of alcohol,  tobacco or illicit drugs Their current medications and supplements The patient's functional ability including ADLs,fall risks, home safety risks, cognitive, and hearing and visual impairment Diet and physical activities Evidence for depression   The patient's weight, height, BMI, and visual acuity have been recorded in the chart.  I have made referrals and provided education to the patient based on review of the above and I have provided the patient with a written personalized care plan for preventive services.    During the course of the visit the patient was educated and counseled about the following appropriate screening and preventive services:   Vaccines to include Pneumoccal, Influenza, Hepatitis B, Td, Zostavax, HCV  Electrocardiogram  Cardiovascular Disease  Colorectal cancer screening  Diabetes screening  Prostate Cancer Screening  Glaucoma screening  Nutrition counseling   Smoking cessation counseling  Patient Instructions (the written plan) was given to the patient.   Reviewed above information.  Agree with plan.  Dr Verlin Dike, [place], LPN  [id number]

## 2016-08-22 DIAGNOSIS — I493 Ventricular premature depolarization: Secondary | ICD-10-CM | POA: Diagnosis not present

## 2016-08-23 DIAGNOSIS — H401131 Primary open-angle glaucoma, bilateral, mild stage: Secondary | ICD-10-CM | POA: Diagnosis not present

## 2016-08-24 DIAGNOSIS — I10 Essential (primary) hypertension: Secondary | ICD-10-CM | POA: Diagnosis not present

## 2016-08-24 DIAGNOSIS — I639 Cerebral infarction, unspecified: Secondary | ICD-10-CM | POA: Diagnosis not present

## 2016-08-24 DIAGNOSIS — I2510 Atherosclerotic heart disease of native coronary artery without angina pectoris: Secondary | ICD-10-CM | POA: Diagnosis not present

## 2016-08-24 DIAGNOSIS — I480 Paroxysmal atrial fibrillation: Secondary | ICD-10-CM | POA: Diagnosis not present

## 2016-08-24 DIAGNOSIS — E119 Type 2 diabetes mellitus without complications: Secondary | ICD-10-CM | POA: Diagnosis not present

## 2016-08-24 DIAGNOSIS — G4733 Obstructive sleep apnea (adult) (pediatric): Secondary | ICD-10-CM | POA: Diagnosis not present

## 2016-08-24 DIAGNOSIS — E7800 Pure hypercholesterolemia, unspecified: Secondary | ICD-10-CM | POA: Diagnosis not present

## 2016-08-29 ENCOUNTER — Other Ambulatory Visit: Payer: Self-pay | Admitting: Internal Medicine

## 2016-08-29 DIAGNOSIS — H401131 Primary open-angle glaucoma, bilateral, mild stage: Secondary | ICD-10-CM | POA: Diagnosis not present

## 2016-09-05 ENCOUNTER — Other Ambulatory Visit (INDEPENDENT_AMBULATORY_CARE_PROVIDER_SITE_OTHER): Payer: PPO

## 2016-09-05 DIAGNOSIS — E119 Type 2 diabetes mellitus without complications: Secondary | ICD-10-CM

## 2016-09-05 DIAGNOSIS — E7800 Pure hypercholesterolemia, unspecified: Secondary | ICD-10-CM

## 2016-09-05 LAB — LIPID PANEL
Cholesterol: 176 mg/dL (ref 0–200)
HDL: 44.4 mg/dL (ref >39.00)
LDL Cholesterol: 95 mg/dL (ref 0–99)
NONHDL: 131.43
Total CHOL/HDL Ratio: 4
Triglycerides: 183 mg/dL — ABNORMAL HIGH (ref 0.0–149.0)
VLDL: 36.6 mg/dL (ref 0.0–40.0)

## 2016-09-05 LAB — HEMOGLOBIN A1C: HEMOGLOBIN A1C: 6.9 % — AB (ref 4.6–6.5)

## 2016-09-05 LAB — BASIC METABOLIC PANEL
BUN: 20 mg/dL (ref 6–23)
CHLORIDE: 104 meq/L (ref 96–112)
CO2: 30 meq/L (ref 19–32)
CREATININE: 0.98 mg/dL (ref 0.40–1.50)
Calcium: 9.4 mg/dL (ref 8.4–10.5)
GFR: 76.93 mL/min (ref >60.00)
Glucose, Bld: 132 mg/dL — ABNORMAL HIGH (ref 70–99)
Potassium: 4.6 mEq/L (ref 3.5–5.1)
Sodium: 139 mEq/L (ref 135–145)

## 2016-09-05 LAB — HEPATIC FUNCTION PANEL
ALK PHOS: 58 U/L (ref 39–117)
ALT: 27 U/L (ref 0–53)
AST: 26 U/L (ref 0–37)
Albumin: 4.2 g/dL (ref 3.5–5.2)
BILIRUBIN TOTAL: 1.1 mg/dL (ref 0.2–1.2)
Bilirubin, Direct: 0.2 mg/dL (ref 0.0–0.3)
Total Protein: 6.6 g/dL (ref 6.0–8.3)

## 2016-09-06 ENCOUNTER — Encounter: Payer: Self-pay | Admitting: Internal Medicine

## 2016-09-08 ENCOUNTER — Ambulatory Visit: Payer: PPO | Admitting: Internal Medicine

## 2016-09-14 ENCOUNTER — Encounter: Payer: Self-pay | Admitting: Internal Medicine

## 2016-09-14 ENCOUNTER — Ambulatory Visit (INDEPENDENT_AMBULATORY_CARE_PROVIDER_SITE_OTHER): Payer: PPO | Admitting: Internal Medicine

## 2016-09-14 VITALS — BP 160/80 | HR 55 | Wt 177.0 lb

## 2016-09-14 DIAGNOSIS — I480 Paroxysmal atrial fibrillation: Secondary | ICD-10-CM

## 2016-09-14 DIAGNOSIS — I10 Essential (primary) hypertension: Secondary | ICD-10-CM | POA: Diagnosis not present

## 2016-09-14 DIAGNOSIS — F439 Reaction to severe stress, unspecified: Secondary | ICD-10-CM | POA: Diagnosis not present

## 2016-09-14 DIAGNOSIS — R6889 Other general symptoms and signs: Secondary | ICD-10-CM | POA: Diagnosis not present

## 2016-09-14 DIAGNOSIS — I2510 Atherosclerotic heart disease of native coronary artery without angina pectoris: Secondary | ICD-10-CM

## 2016-09-14 DIAGNOSIS — E7800 Pure hypercholesterolemia, unspecified: Secondary | ICD-10-CM

## 2016-09-14 DIAGNOSIS — E119 Type 2 diabetes mellitus without complications: Secondary | ICD-10-CM

## 2016-09-14 LAB — CBC WITH DIFFERENTIAL/PLATELET
Basophils Absolute: 0 K/uL (ref 0.0–0.1)
Basophils Relative: 0.3 % (ref 0.0–3.0)
Eosinophils Absolute: 0.2 K/uL (ref 0.0–0.7)
Eosinophils Relative: 3.2 % (ref 0.0–5.0)
HCT: 43.7 % (ref 39.0–52.0)
Hemoglobin: 14.8 g/dL (ref 13.0–17.0)
Lymphocytes Relative: 29.7 % (ref 12.0–46.0)
Lymphs Abs: 2 K/uL (ref 0.7–4.0)
MCHC: 33.7 g/dL (ref 30.0–36.0)
MCV: 95.5 fl (ref 78.0–100.0)
Monocytes Absolute: 0.8 K/uL (ref 0.1–1.0)
Monocytes Relative: 11.4 % (ref 3.0–12.0)
Neutro Abs: 3.7 K/uL (ref 1.4–7.7)
Neutrophils Relative %: 55.4 % (ref 43.0–77.0)
Platelets: 174 K/uL (ref 150.0–400.0)
RBC: 4.58 Mil/uL (ref 4.22–5.81)
RDW: 13.1 % (ref 11.5–15.5)
WBC: 6.7 K/uL (ref 4.0–10.5)

## 2016-09-14 NOTE — Progress Notes (Signed)
Patient ID: Kyle Richardson, male   DOB: [birth year], 82 y.o.   MRN: [id number]   Subjective:    Patient ID: Kyle Richardson, male    DOB: 1928-01-30, 82 y.o.   MRN: [id number]  HPI  Patient here for a scheduled follow up.  Was seen in ER on 08/14/16 for right sided chest pressure.  ER note reviewed.  EMS called to his home.  Upon arrival it is noted he was in afib with ventricular rate on 109.  In ER, he had converted to SR.  Saw cardiology.  Had echo and holter.  Holter revealed predominantly SR with some PACs and PVCs.  He did have 4 brief atrial runs felt to be c/w afib.  ECHO revealed EF of 55% with mild to moderate mitral valve insufficiency.  No evidence of thrombus formation.  On eliquis now.  He has had no further chest pain.  Stays active.  No chest pain or sob with exertion.  Metoprolol was added to his medication regimen.  He does report that he informed cardiology that he has noticed some blood tinged mucus.  States he only notices in the morning when he awakens.  Will cough up some mucus that is blood tinged.  He has just recently started back wearing a mouth piece.  Was wondering if it could be aggravating.  Reports no chest congestion.  Some minimal nasal congestion.  No sob.  No gross hemoptysis.  Only occurs when he first wakes up.  Does not occur throughout the day.  Eating and drinking well.  No acid reflux.  No abdominal pain or cramping.  Bowels stable.     Past Medical History:  Diagnosis Date  . 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  s/p PTCA of the mid RCA 1/96, also  s/p  CABG x 4 7/96  . Degenerative disc disease   . Diabetes mellitus ([place])   . Glaucoma   . Hypercholesterolemia   . Hypertension   . Migraine headache    history of  . Osteoarthritis   . Sleep apnea    Past Surgical History:  Procedure Laterality Date  . APPENDECTOMY    . CHOLECYSTECTOMY     Removed  . CORONARY ARTERY BYPASS GRAFT  7/96   x4  . HEMORRHOID SURGERY      . HERNIA REPAIR    . UPP and tonsillectomy  1/86   Family History  Problem Relation Age of Onset  . Heart disease Mother     died age 5  . Heart disease Father     and other vascular disease  . Diabetes      four siblings  . Heart disease Brother     s/p CABG  . Colon cancer Neg Hx   . Prostate cancer Neg Hx    Social History   Social History  . Marital status: Married    Spouse name: N/A  . Number of children: 2  . Years of education: N/A   Social History Main Topics  . Smoking status: Never Smoker  . Smokeless tobacco: Never Used  . Alcohol use No  . Drug use: No  . Sexual activity: Not Currently   Other Topics Concern  . None   Social History Narrative  . None    Outpatient Encounter Prescriptions as of 09/14/2016  Medication Sig  . ACCU-CHEK AVIVA PLUS test strip CHECK BLOOD SUGAR ONCE DAILY.  Marland Kitchen ACCU-CHEK SOFTCLIX LANCETS lancets USE AS  DIRECTED  . apixaban (ELIQUIS) 5 MG TABS tablet Take by mouth.  Marland Kitchen atorvastatin (LIPITOR) 20 MG tablet TAKE 1 TABLET BY MOUTH EVERY DAY.  . calcium citrate-vitamin D (CITRACAL+D) 315-200 MG-UNIT per tablet Take 1 tablet by mouth daily.  . Chlorpheniramine Maleate (ALLERGY RELIEF PO) Take by mouth daily.  Marland Kitchen FLUoxetine (PROZAC) 10 MG capsule TAKE 1 CAPSULE BY MOUTH EVERY DAY.  . fluticasone (FLONASE) 50 MCG/ACT nasal spray TWO PUFFS IN EACH NOSTRIL ONCE A DAY  . latanoprost (XALATAN) 0.005 % ophthalmic solution Place 1 drop into both eyes at bedtime.  . Multiple Vitamin (MULTIVITAMIN) tablet Take 1 tablet by mouth daily.  . Multiple Vitamins-Minerals (PRESERVISION AREDS 2) CAPS Take by mouth daily.  . mupirocin ointment (BACTROBAN) 2 % Apply to affected areas bid  . nitroGLYCERIN (NITROSTAT) 0.4 MG SL tablet Place 0.4 mg under the tongue every 5 (five) minutes as needed. As directed  . pantoprazole (PROTONIX) 40 MG tablet Take 40 mg by mouth daily.   No facility-administered encounter medications on file as of 09/14/2016.      Review of Systems  Constitutional: Negative for appetite change and unexpected weight change.  HENT: Negative for sinus pressure and sore throat.        Minimal nasal congestion.    Respiratory: Negative for cough, chest tightness and shortness of breath.        Coughs up some blood tinged sputum in the am as outlined.    Cardiovascular: Negative for chest pain, palpitations and leg swelling.  Gastrointestinal: Negative for abdominal pain, diarrhea, nausea and vomiting.  Genitourinary: Negative for difficulty urinating and dysuria.  Musculoskeletal: Negative for back pain and joint swelling.  Skin: Negative for color change and rash.  Neurological: Negative for dizziness, light-headedness and headaches.  Psychiatric/Behavioral: Negative for agitation and dysphoric mood.       Objective:     Blood pressure rechecked by me:  130/62  Physical Exam  Constitutional: He appears well-developed and well-nourished. No distress.  HENT:  Nose: Nose normal.  Mouth/Throat: Oropharynx is clear and moist.  Neck: No thyromegaly present.  Cardiovascular: Normal rate and regular rhythm.   Pulmonary/Chest: Effort normal and breath sounds normal. No respiratory distress.  Abdominal: Soft. Bowel sounds are normal. There is no tenderness.  Musculoskeletal: He exhibits no edema or tenderness.  Lymphadenopathy:    He has no cervical adenopathy.  Skin: No rash noted. No erythema.  Psychiatric: He has a normal mood and affect. His behavior is normal.    BP (!) 160/80   Pulse (!) 55   Wt 177 lb (80.3 kg)   SpO2 96%   BMI 27.72 kg/m  Wt Readings from Last 3 Encounters:  09/14/16 177 lb (80.3 kg)  08/21/16 173 lb 1.9 oz (78.5 kg)  08/14/16 172 lb (78 kg)     Lab Results  Component Value Date   WBC 6.7 09/14/2016   HGB 14.8 09/14/2016   HCT 43.7 09/14/2016   PLT 174.0 09/14/2016   GLUCOSE 132 (H) 09/05/2016   CHOL 176 09/05/2016   TRIG 183.0 (H) 09/05/2016   HDL 44.40 09/05/2016    LDLDIRECT 68.0 12/21/2015   LDLCALC 95 09/05/2016   ALT 27 09/05/2016   AST 26 09/05/2016   NA 139 09/05/2016   K 4.6 09/05/2016   CL 104 09/05/2016   CREATININE 0.98 09/05/2016   BUN 20 09/05/2016   CO2 30 09/05/2016   TSH 2.29 05/05/2016   PSA 0.66 05/05/2016   INR 1.4 02/17/2014  HGBA1C 6.9 (H) 09/05/2016   MICROALBUR 3.7 (H) 05/05/2016    Dg Chest 2 View  Result Date: 08/14/2016 CLINICAL DATA:  Left-sided chest pressure EXAM: CHEST  2 VIEW COMPARISON:  March 23, 2014 FINDINGS: There is a stable presumed nipple shadow on the left. There is slight atelectasis in the left base. Lungs elsewhere are clear. Heart size and pulmonary vascularity are normal. No adenopathy. Patient is status post coronary artery bypass grafting. There is atherosclerotic calcification in the aorta. No bone lesions evident. IMPRESSION: Slight left base atelectasis. No edema or consolidation. There is aortic atherosclerosis. Patient is status post coronary artery bypass grafting. Electronically Signed   By: Lowella Grip III M.D.   On: 08/14/2016 12:49       Assessment & Plan:   Problem List Items Addressed This Visit    A-fib ([place])    Appears to be in SR.  On eliquis.  Follow.  Just recently started metoprolol.  Followed by cardiology.        Relevant Orders   CBC with Differential/Platelet (Completed)   CAD (coronary artery disease)    Followed by cardiology.  Recently evaluated.  No further chest pain.  Echo as outlined.        Diabetes mellitus ([place])    Low carb diet and exercise.  Follow met b and a1c.   Lab Results  Component Value Date   HGBA1C 6.9 (H) 09/05/2016        Hypercholesterolemia    On lipitor.  Low cholesterol diet and exercise.  Follow lipid panel and liver function tests.   Lab Results  Component Value Date   CHOL 176 09/05/2016   HDL 44.40 09/05/2016   LDLCALC 95 09/05/2016   LDLDIRECT 68.0 12/21/2015   TRIG 183.0 (H) 09/05/2016   CHOLHDL 4 09/05/2016         Hypertension    Blood pressure on recheck wnl.  Same medication regimen.  Follow pressures.  Follow metabolic panel.        Stress    Doing well on prozac.  Follow.        Other Visit Diagnoses    Congestion of throat    -  Primary   Describes some blood tinged mucus in am.  small irritated lesion in post OP.  cardiology aware.  told to monitor.  follow.  update in the next few days.        Einar Pheasant, MD

## 2016-09-15 ENCOUNTER — Encounter: Payer: Self-pay | Admitting: Internal Medicine

## 2016-09-17 ENCOUNTER — Encounter: Payer: Self-pay | Admitting: Internal Medicine

## 2016-09-17 NOTE — Assessment & Plan Note (Signed)
Doing well on prozac.  Follow  

## 2016-09-17 NOTE — Assessment & Plan Note (Signed)
Blood pressure on recheck wnl.  Same medication regimen.  Follow pressures.  Follow metabolic panel.   

## 2016-09-17 NOTE — Assessment & Plan Note (Signed)
Low carb diet and exercise.  Follow met b and a1c.   Lab Results  Component Value Date   HGBA1C 6.9 (H) 09/05/2016

## 2016-09-17 NOTE — Assessment & Plan Note (Signed)
Followed by cardiology.  Recently evaluated.  No further chest pain.  Echo as outlined.

## 2016-09-17 NOTE — Assessment & Plan Note (Signed)
On lipitor.  Low cholesterol diet and exercise.  Follow lipid panel and liver function tests.   Lab Results  Component Value Date   CHOL 176 09/05/2016   HDL 44.40 09/05/2016   LDLCALC 95 09/05/2016   LDLDIRECT 68.0 12/21/2015   TRIG 183.0 (H) 09/05/2016   CHOLHDL 4 09/05/2016

## 2016-09-17 NOTE — Assessment & Plan Note (Signed)
Appears to be in SR.  On eliquis.  Follow.  Just recently started metoprolol.  Followed by cardiology.

## 2016-09-27 DIAGNOSIS — M65341 Trigger finger, right ring finger: Secondary | ICD-10-CM | POA: Diagnosis not present

## 2016-10-09 ENCOUNTER — Encounter: Payer: Self-pay | Admitting: Internal Medicine

## 2016-10-17 ENCOUNTER — Encounter: Payer: Self-pay | Admitting: Internal Medicine

## 2016-10-18 NOTE — Telephone Encounter (Signed)
Please call pt and confirm no other symptoms, other than just noticing pink tint to mucus.  See my chart message.  Since I do not have opening this week, see if he would be willing to see someone here to get w/up started and then I can f/u after.  If this is a problem, let me know.  Thanks

## 2016-10-20 NOTE — Telephone Encounter (Signed)
Left message to return call to office 10/19/16 and again to day.

## 2016-10-20 NOTE — Telephone Encounter (Signed)
Talked with patient he is now using a humidifier to [place] , and someday he has the tinged pink saliva and other days it is fine,  Patient stated Dr. Idelle Leech felt he was fine but advised patient to follow up with PCP .  PCP advised to scheduled patient with first available opening with a provider scheduled with Dr. Lacinda Axon on Monday at 3

## 2016-10-23 ENCOUNTER — Encounter: Payer: Self-pay | Admitting: Family Medicine

## 2016-10-23 ENCOUNTER — Ambulatory Visit (INDEPENDENT_AMBULATORY_CARE_PROVIDER_SITE_OTHER): Payer: PPO

## 2016-10-23 ENCOUNTER — Ambulatory Visit (INDEPENDENT_AMBULATORY_CARE_PROVIDER_SITE_OTHER): Payer: PPO | Admitting: Family Medicine

## 2016-10-23 VITALS — BP 159/80 | HR 60 | Temp 98.4°F | Resp 14 | Wt 179.2 lb

## 2016-10-23 DIAGNOSIS — R0989 Other specified symptoms and signs involving the circulatory and respiratory systems: Secondary | ICD-10-CM | POA: Diagnosis not present

## 2016-10-23 DIAGNOSIS — R059 Cough, unspecified: Secondary | ICD-10-CM

## 2016-10-23 DIAGNOSIS — R05 Cough: Secondary | ICD-10-CM

## 2016-10-23 DIAGNOSIS — R042 Hemoptysis: Secondary | ICD-10-CM | POA: Insufficient documentation

## 2016-10-23 NOTE — Assessment & Plan Note (Signed)
New acute problem. Exam unremarkable. Obtaining chest x-ray today. Delsym for cough.

## 2016-10-23 NOTE — Assessment & Plan Note (Signed)
Established problem, persistent/worsening. No frank blood noted. I'm not sure if the patient is actually having hemoptysis or not. He's had normal labs recently. Nonsmoker. Uncertain etiology/prognosis at this time. Given duration of reported symptoms, obtaining chest x-ray today.

## 2016-10-23 NOTE — Patient Instructions (Signed)
We will call with the xray results.  Delsym for cough.  Take care  Dr. Lacinda Axon

## 2016-10-23 NOTE — Progress Notes (Signed)
Subjective:  Patient ID: Kyle Richardson, male    DOB: 05-05-1928  Age: 83 y.o. MRN: [id number]  CC: Cough, congestion, ? Blood tinged mucous  HPI:  83 year old male with CAD, DM 2, A. fib on Eliquis, hypertension, hyperlipidemia presents with the above complaints.  Patient reports a one-month history of concern for blood-tinged sputum. He states that in the morning he awakes and coughs up pinkish/brownish sputum. He has discussed this with his cardiologist as this did not start until after he started Eliquis. He is also seen his PCP regarding this. He continues to have an ongoing issue regarding this. He is concerned. No shortness of breath. No chest pain.  Additionally, patient states that he's had worsening cough and congestion since this past Tuesday. His wife has had a similar illness. She is still currently sick. Intermittent discolored sputum. Cough is moderate in severity. Worse in the evening. No reports of shortness of breath. No fever. No known exacerbating or relieving factors. No other complaints or concerns at this time.  Social Hx   Social History   Social History  . Marital status: Married    Spouse name: N/A  . Number of children: 2  . Years of education: N/A   Social History Main Topics  . Smoking status: Never Smoker  . Smokeless tobacco: Never Used  . Alcohol use No  . Drug use: No  . Sexual activity: Not Currently   Other Topics Concern  . None   Social History Narrative  . None    Review of Systems  Constitutional: Negative for fever.  HENT: Positive for congestion.   Respiratory: Positive for cough. Negative for shortness of breath.        ? Blood tinged sputum.   Objective:  BP (!) 159/80   Pulse 60   Temp 98.4 F (36.9 C) (Oral)   Resp 14   Wt 179 lb 3.2 oz (81.3 kg)   SpO2 95%   BMI 28.07 kg/m   BP/Weight 10/23/2016 09/14/2016 [id number]  Systolic BP [id number]  Diastolic BP 80 80 62  Wt. (Lbs) 179.2 177 173.12  BMI 28.07 27.72 27.11     Physical Exam  Constitutional: He is oriented to person, place, and time. He appears well-developed. No distress.  HENT:  Mouth/Throat: Oropharynx is clear and moist.  Cardiovascular: Normal rate and regular rhythm.   Pulmonary/Chest: Breath sounds normal. He has no wheezes. He has no rales.  Neurological: He is alert and oriented to person, place, and time.  Vitals reviewed.  Lab Results  Component Value Date   WBC 6.7 09/14/2016   HGB 14.8 09/14/2016   HCT 43.7 09/14/2016   PLT 174.0 09/14/2016   GLUCOSE 132 (H) 09/05/2016   CHOL 176 09/05/2016   TRIG 183.0 (H) 09/05/2016   HDL 44.40 09/05/2016   LDLDIRECT 68.0 12/21/2015   LDLCALC 95 09/05/2016   ALT 27 09/05/2016   AST 26 09/05/2016   NA 139 09/05/2016   K 4.6 09/05/2016   CL 104 09/05/2016   CREATININE 0.98 09/05/2016   BUN 20 09/05/2016   CO2 30 09/05/2016   TSH 2.29 05/05/2016   PSA 0.66 05/05/2016   INR 1.4 02/17/2014   HGBA1C 6.9 (H) 09/05/2016   MICROALBUR 3.7 (H) 05/05/2016    Assessment & Plan:   Problem List Items Addressed This Visit    Cough    New acute problem. Exam unremarkable. Obtaining chest x-ray today. Delsym for cough.      Relevant Orders  DG Chest 2 View   Blood-tinged sputum - Primary    Established problem, persistent/worsening. No frank blood noted. I'm not sure if the patient is actually having hemoptysis or not. He's had normal labs recently. Nonsmoker. Uncertain etiology/prognosis at this time. Given duration of reported symptoms, obtaining chest x-ray today.         Follow-up: PRN  [place]

## 2016-10-23 NOTE — Progress Notes (Signed)
Pre visit review using our clinic review tool, if applicable. No additional management support is needed unless otherwise documented below in the visit note. 

## 2016-10-24 ENCOUNTER — Other Ambulatory Visit: Payer: Self-pay | Admitting: Family Medicine

## 2016-10-24 MED ORDER — HYDROCOD POLST-CPM POLST ER 10-8 MG/5ML PO SUER: 5.0000 mL | Freq: Two times a day (BID) | ORAL | 0 refills | Status: DC | PRN

## 2016-10-30 ENCOUNTER — Encounter: Payer: Self-pay | Admitting: Internal Medicine

## 2016-10-30 NOTE — Telephone Encounter (Signed)
Patient saw dr. Lacinda Axon on 10/23/16 and this was his note pasted below.  Established problem, persistent/worsening. No frank blood noted. I'm not sure if the patient is actually having hemoptysis or not. He's had normal labs recently. Nonsmoker. Uncertain etiology/prognosis at this time. Given duration of reported symptoms, obtaining chest x-ray today.

## 2016-10-31 NOTE — Telephone Encounter (Signed)
Patient scheduled for 11/06/16 11.30

## 2016-10-31 NOTE — Telephone Encounter (Signed)
I can see him before 11/10/16.  I have a blocked appt on 11/02/16.  If Caryl Pina is not using, can put him in this spot.  Also, I would like for him to see ENT since it is only in the mucus in the am.  I want to make sure not coming from sinus or nasal source as well.  We can schedule an appt if he needs [place] to schedule.  Just let me know.

## 2016-11-06 ENCOUNTER — Encounter: Payer: Self-pay | Admitting: Internal Medicine

## 2016-11-06 ENCOUNTER — Ambulatory Visit (INDEPENDENT_AMBULATORY_CARE_PROVIDER_SITE_OTHER): Payer: PPO | Admitting: Internal Medicine

## 2016-11-06 VITALS — BP 130/62 | HR 60 | Temp 98.7°F | Resp 16 | Ht 67.0 in | Wt 179.2 lb

## 2016-11-06 DIAGNOSIS — R059 Cough, unspecified: Secondary | ICD-10-CM

## 2016-11-06 DIAGNOSIS — R042 Hemoptysis: Secondary | ICD-10-CM

## 2016-11-06 DIAGNOSIS — I10 Essential (primary) hypertension: Secondary | ICD-10-CM | POA: Diagnosis not present

## 2016-11-06 DIAGNOSIS — R079 Chest pain, unspecified: Secondary | ICD-10-CM | POA: Diagnosis not present

## 2016-11-06 DIAGNOSIS — E119 Type 2 diabetes mellitus without complications: Secondary | ICD-10-CM

## 2016-11-06 DIAGNOSIS — R05 Cough: Secondary | ICD-10-CM

## 2016-11-06 DIAGNOSIS — I480 Paroxysmal atrial fibrillation: Secondary | ICD-10-CM

## 2016-11-06 DIAGNOSIS — E7800 Pure hypercholesterolemia, unspecified: Secondary | ICD-10-CM

## 2016-11-06 DIAGNOSIS — I2510 Atherosclerotic heart disease of native coronary artery without angina pectoris: Secondary | ICD-10-CM

## 2016-11-06 NOTE — Progress Notes (Signed)
Pre-visit discussion using our clinic review tool. No additional management support is needed unless otherwise documented below in the visit note.  

## 2016-11-06 NOTE — Progress Notes (Signed)
Patient ID: Dell Briner, male   DOB: Jan 31, 1928, 83 y.o.   MRN: [id number]   Subjective:    Patient ID: [name], male    DOB: [birth year], 83 y.o.   MRN: [id number]  HPI  Patient here as a work in with concerns regarding some persistent pink mucus - coughing up in am.  He was recently diagnosed with afib.  Was placed on eliquis.  Since starting the medication, he reports noticing pink tinged mucus in the am.  This is the only time he notices the change.  No sinus pressure.  No change in allergy/sinus symptoms.  No sob.  Some intermittent chest pain.  None currently.  Sees cardiology.  Want him to remain on eliquis. No acid reflux.  No abdominal pain or cramping.  Bowels stable.  No blood in his urine or stool.      Past Medical History:  Diagnosis Date  . 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  s/p PTCA of the mid RCA 1/96, also  s/p  CABG x 4 7/96  . Degenerative disc disease   . Diabetes mellitus ([place])   . Glaucoma   . Hypercholesterolemia   . Hypertension   . Migraine headache    history of  . Osteoarthritis   . Sleep apnea    Past Surgical History:  Procedure Laterality Date  . APPENDECTOMY    . CHOLECYSTECTOMY     Removed  . CORONARY ARTERY BYPASS GRAFT  7/96   x4  . HEMORRHOID SURGERY    . HERNIA REPAIR    . UPP and tonsillectomy  1/86   Family History  Problem Relation Age of Onset  . Heart disease Mother     died age 51  . Heart disease Father     and other vascular disease  . Diabetes      four siblings  . Heart disease Brother     s/p CABG  . Colon cancer Neg Hx   . Prostate cancer Neg Hx    Social History   Social History  . Marital status: Married    Spouse name: N/A  . Number of children: 2  . Years of education: N/A   Social History Main Topics  . Smoking status: Never Smoker  . Smokeless tobacco: Never Used  . Alcohol use No  . Drug use: No  . Sexual activity: Not Currently   Other Topics Concern    . None   Social History Narrative  . None    Outpatient Encounter Prescriptions as of 11/06/2016  Medication Sig  . ACCU-CHEK AVIVA PLUS test strip CHECK BLOOD SUGAR ONCE DAILY.  Marland Kitchen ACCU-CHEK SOFTCLIX LANCETS lancets USE AS DIRECTED  . apixaban (ELIQUIS) 5 MG TABS tablet Take by mouth.  Marland Kitchen atorvastatin (LIPITOR) 20 MG tablet TAKE 1 TABLET BY MOUTH EVERY DAY.  . calcium citrate-vitamin D (CITRACAL+D) 315-200 MG-UNIT per tablet Take 1 tablet by mouth daily.  . Chlorpheniramine Maleate (ALLERGY RELIEF PO) Take by mouth daily.  . chlorpheniramine-HYDROcodone (TUSSIONEX PENNKINETIC ER) 10-8 MG/5ML SUER Take 5 mLs by mouth every 12 (twelve) hours as needed.  Marland Kitchen FLUoxetine (PROZAC) 10 MG capsule TAKE 1 CAPSULE BY MOUTH EVERY DAY.  . fluticasone (FLONASE) 50 MCG/ACT nasal spray TWO PUFFS IN EACH NOSTRIL ONCE A DAY  . latanoprost (XALATAN) 0.005 % ophthalmic solution Place 1 drop into both eyes at bedtime.  . Multiple Vitamin (MULTIVITAMIN) tablet Take 1 tablet by mouth daily.  Marland Kitchen  Multiple Vitamins-Minerals (PRESERVISION AREDS 2) CAPS Take by mouth daily.  . mupirocin ointment (BACTROBAN) 2 % Apply to affected areas bid  . nitroGLYCERIN (NITROSTAT) 0.4 MG SL tablet Place 0.4 mg under the tongue every 5 (five) minutes as needed. As directed  . pantoprazole (PROTONIX) 40 MG tablet Take 40 mg by mouth daily.   No facility-administered encounter medications on file as of 11/06/2016.     Review of Systems  Constitutional: Negative for appetite change and unexpected weight change.  HENT: Negative for sinus pressure.        No significant congestion.  No change in allergy symptoms.    Respiratory: Negative for cough, chest tightness and shortness of breath.        Previously had increased cough, but no blood noted then.  Cough has resolved.    Cardiovascular: Negative for chest pain, palpitations and leg swelling.  Gastrointestinal: Negative for abdominal pain, diarrhea, nausea and vomiting.   Genitourinary: Negative for difficulty urinating and dysuria.  Musculoskeletal: Negative for back pain and joint swelling.  Skin: Negative for color change and rash.  Neurological: Negative for dizziness, light-headedness and headaches.  Psychiatric/Behavioral: Negative for agitation and dysphoric mood.       Objective:    Physical Exam  Constitutional: He appears well-developed and well-nourished. No distress.  HENT:  Nose: Nose normal.  Mouth/Throat: Oropharynx is clear and moist.  Neck: Neck supple. No thyromegaly present.  Cardiovascular: Normal rate and regular rhythm.   Pulmonary/Chest: Effort normal and breath sounds normal. No respiratory distress.  Abdominal: Soft. Bowel sounds are normal. There is no tenderness.  Musculoskeletal: He exhibits no edema or tenderness.  Lymphadenopathy:    He has no cervical adenopathy.  Skin: No rash noted. No erythema.  Psychiatric: He has a normal mood and affect. His behavior is normal.    BP 130/62 (BP Location: Left Arm, Patient Position: Sitting, Cuff Size: Large)   Pulse 60   Temp 98.7 F (37.1 C) (Oral)   Resp 16   Ht '5\' 7"'  (1.702 m)   Wt 179 lb 3.2 oz (81.3 kg)   SpO2 94%   BMI 28.07 kg/m  Wt Readings from Last 3 Encounters:  11/06/16 179 lb 3.2 oz (81.3 kg)  10/23/16 179 lb 3.2 oz (81.3 kg)  09/14/16 177 lb (80.3 kg)     Lab Results  Component Value Date   WBC 6.7 09/14/2016   HGB 14.8 09/14/2016   HCT 43.7 09/14/2016   PLT 174.0 09/14/2016   GLUCOSE 132 (H) 09/05/2016   CHOL 176 09/05/2016   TRIG 183.0 (H) 09/05/2016   HDL 44.40 09/05/2016   LDLDIRECT 68.0 12/21/2015   LDLCALC 95 09/05/2016   ALT 27 09/05/2016   AST 26 09/05/2016   NA 139 09/05/2016   K 4.6 09/05/2016   CL 104 09/05/2016   CREATININE 0.98 09/05/2016   BUN 20 09/05/2016   CO2 30 09/05/2016   TSH 2.29 05/05/2016   PSA 0.66 05/05/2016   INR 1.4 02/17/2014   HGBA1C 6.9 (H) 09/05/2016   MICROALBUR 3.7 (H) 05/05/2016    Dg Chest 2  View  Result Date: 08/14/2016 CLINICAL DATA:  Left-sided chest pressure EXAM: CHEST  2 VIEW COMPARISON:  March 23, 2014 FINDINGS: There is a stable presumed nipple shadow on the left. There is slight atelectasis in the left base. Lungs elsewhere are clear. Heart size and pulmonary vascularity are normal. No adenopathy. Patient is status post coronary artery bypass grafting. There is atherosclerotic calcification in  the aorta. No bone lesions evident. IMPRESSION: Slight left base atelectasis. No edema or consolidation. There is aortic atherosclerosis. Patient is status post coronary artery bypass grafting. Electronically Signed   By: Lowella Grip III M.D.   On: 08/14/2016 12:49       Assessment & Plan:   Problem List Items Addressed This Visit    A-fib ([place])    On eliquis.  In SR now.  Follow.  Continues f/u with cardiology.        Blood-tinged sputum    Blood tinged sputum persist.  Only in am.  No blood with blowing his nose or coughing.  Will have ENT evaluate.  cxr clear.  Given persistent problem, will obtain chest CT        CAD (coronary artery disease)    Followed by cardiology.  Given h/o CAD and some intermittent chest discomfort, will get him back in with cardiology for evaluation and further treatment.  EKG -SR/SB with ventricular rate 56, no acute ischemic changes.  Follow.        Cough    Increased cough has resolved.  No blood with coughing.  Only notices blood tinge in am.  cxr clear.        Diabetes mellitus ([place])    Low carb diet and exercise.  Follow met b and a1c.        Hypercholesterolemia    On lipitor.  Low cholesterol diet and exercise.  Follow lipid panel and liver function tests.        Hypertension    Blood pressure under good control.  Continue same medication regimen.  Follow pressures.  Follow metabolic panel.         Other Visit Diagnoses    Chest pain, unspecified type    -  Primary   Relevant Orders   EKG 12-Lead (Completed)   CT Chest W  Contrast   Ambulatory referral to Cardiology   Hemoptysis       Relevant Orders   CT Chest W Contrast   Ambulatory referral to ENT   Bloody sputum       Relevant Orders   Ambulatory referral to ENT       Einar Pheasant, MD

## 2016-11-10 ENCOUNTER — Ambulatory Visit: Payer: PPO | Admitting: Internal Medicine

## 2016-11-12 ENCOUNTER — Encounter: Payer: Self-pay | Admitting: Internal Medicine

## 2016-11-12 NOTE — Assessment & Plan Note (Signed)
On eliquis.  In SR now.  Follow.  Continues f/u with cardiology.

## 2016-11-12 NOTE — Assessment & Plan Note (Signed)
On lipitor.  Low cholesterol diet and exercise.  Follow lipid panel and liver function tests.   

## 2016-11-12 NOTE — Assessment & Plan Note (Signed)
Followed by cardiology.  Given h/o CAD and some intermittent chest discomfort, will get him back in with cardiology for evaluation and further treatment.  EKG -SR/SB with ventricular rate 56, no acute ischemic changes.  Follow.

## 2016-11-12 NOTE — Assessment & Plan Note (Signed)
Low carb diet and exercise.  Follow met b and a1c.   

## 2016-11-12 NOTE — Assessment & Plan Note (Signed)
Blood tinged sputum persist.  Only in am.  No blood with blowing his nose or coughing.  Will have ENT evaluate.  cxr clear.  Given persistent problem, will obtain chest CT

## 2016-11-12 NOTE — Assessment & Plan Note (Signed)
Blood pressure under good control.  Continue same medication regimen.  Follow pressures.  Follow metabolic panel.   

## 2016-11-12 NOTE — Assessment & Plan Note (Signed)
Increased cough has resolved.  No blood with coughing.  Only notices blood tinge in am.  cxr clear.

## 2016-11-14 DIAGNOSIS — I480 Paroxysmal atrial fibrillation: Secondary | ICD-10-CM | POA: Diagnosis not present

## 2016-11-14 DIAGNOSIS — E119 Type 2 diabetes mellitus without complications: Secondary | ICD-10-CM | POA: Diagnosis not present

## 2016-11-14 DIAGNOSIS — G4733 Obstructive sleep apnea (adult) (pediatric): Secondary | ICD-10-CM | POA: Diagnosis not present

## 2016-11-14 DIAGNOSIS — I639 Cerebral infarction, unspecified: Secondary | ICD-10-CM | POA: Diagnosis not present

## 2016-11-14 DIAGNOSIS — E7800 Pure hypercholesterolemia, unspecified: Secondary | ICD-10-CM | POA: Diagnosis not present

## 2016-11-14 DIAGNOSIS — I10 Essential (primary) hypertension: Secondary | ICD-10-CM | POA: Diagnosis not present

## 2016-11-14 DIAGNOSIS — I2510 Atherosclerotic heart disease of native coronary artery without angina pectoris: Secondary | ICD-10-CM | POA: Diagnosis not present

## 2016-11-16 ENCOUNTER — Other Ambulatory Visit: Payer: Self-pay | Admitting: Internal Medicine

## 2016-11-20 ENCOUNTER — Other Ambulatory Visit: Payer: Self-pay | Admitting: Internal Medicine

## 2016-11-20 DIAGNOSIS — R042 Hemoptysis: Secondary | ICD-10-CM

## 2016-11-20 NOTE — Progress Notes (Signed)
Orders placed for stat labs prior to ct chest.

## 2016-11-23 DIAGNOSIS — R042 Hemoptysis: Secondary | ICD-10-CM | POA: Diagnosis not present

## 2016-12-04 ENCOUNTER — Other Ambulatory Visit: Payer: Self-pay | Admitting: Internal Medicine

## 2016-12-04 ENCOUNTER — Ambulatory Visit
Admission: RE | Admit: 2016-12-04 | Discharge: 2016-12-04 | Disposition: A | Discharge status: Home / Self Care | Payer: PPO | Source: Ambulatory Visit | Attending: Internal Medicine | Admitting: Internal Medicine

## 2016-12-04 DIAGNOSIS — R079 Chest pain, unspecified: Secondary | ICD-10-CM

## 2016-12-04 DIAGNOSIS — R0989 Other specified symptoms and signs involving the circulatory and respiratory systems: Secondary | ICD-10-CM

## 2016-12-04 DIAGNOSIS — R918 Other nonspecific abnormal finding of lung field: Secondary | ICD-10-CM | POA: Diagnosis not present

## 2016-12-04 DIAGNOSIS — R042 Hemoptysis: Secondary | ICD-10-CM | POA: Diagnosis not present

## 2016-12-04 DIAGNOSIS — I773 Arterial fibromuscular dysplasia: Principal | ICD-10-CM

## 2016-12-04 LAB — POCT I-STAT CREATININE: Creatinine, Ser: 0.9 mg/dL (ref 0.61–1.24)

## 2016-12-04 MED ORDER — IOPAMIDOL (ISOVUE-300) INJECTION 61%
75.0000 mL | Freq: Once | INTRAVENOUS | Status: AC | PRN
  Administered 2016-12-04: 75 mL via INTRAVENOUS

## 2016-12-04 MED ORDER — AZITHROMYCIN 250 MG PO TABS: ORAL_TABLET | ORAL | 0 refills | Status: DC

## 2016-12-04 NOTE — Progress Notes (Signed)
Order placed for referral to vascular surgery.  

## 2016-12-04 NOTE — Progress Notes (Signed)
rx sent in for zpak.   

## 2016-12-12 DIAGNOSIS — L851 Acquired keratosis [keratoderma] palmaris et plantaris: Secondary | ICD-10-CM | POA: Diagnosis not present

## 2016-12-12 DIAGNOSIS — E119 Type 2 diabetes mellitus without complications: Secondary | ICD-10-CM | POA: Diagnosis not present

## 2016-12-12 DIAGNOSIS — M79671 Pain in right foot: Secondary | ICD-10-CM | POA: Diagnosis not present

## 2016-12-13 ENCOUNTER — Telehealth: Payer: Self-pay | Admitting: Internal Medicine

## 2016-12-13 NOTE — Telephone Encounter (Signed)
-----   Message from Lieutenant Diego, RN sent at 12/08/2016  9:24 AM EST ----- Regarding: RE: review CT Dr. Nicki Reaper,  I have given this information to Dr. Genevive Bi to review and am waiting on his opinion. I will follow up with him and get back with you. My gut feeling is with Kyle Richardson comorbidities and age that a short term follow up may be very appropriate unless the hemoptysis increases.  Kyle Richardson ----- Message ----- From: Einar Pheasant, MD Sent: 12/04/2016   8:36 PM To: Lieutenant Diego, RN Subject: review CT                                      Would you mind having someone review this scan also.  Kyle Richardson is aware of the results and is ok with seeing someone if you feel this is necessary.  Let me know if you need anymore information.    Thanks   [employer]

## 2016-12-13 NOTE — Telephone Encounter (Signed)
-----   Message from Lieutenant Diego, RN sent at 12/12/2016  9:06 AM EST ----- Regarding: RE: review CT Dr. Genevive Bi agreed with the 3 month follow up of lung finding. He did not see anything that would correlate with the pink am sputum, other than the eliquis. Sorry for the delayed response. Hope you are well Dr. Nicki Reaper. Shawn ----- Message ----- From: Einar Pheasant, MD Sent: 12/04/2016   8:36 PM To: Lieutenant Diego, RN Subject: review CT                                      Would you mind having someone review this scan also.  Mr Maniaci is aware of the results and is ok with seeing someone if you feel this is necessary.  Let me know if you need anymore information.    Thanks   [employer]

## 2016-12-13 NOTE — Telephone Encounter (Signed)
Please call Mr Zawadzki and notify him that I did have his scan reviewed.  They recommended a f/u CT scan of the chest in 3 months.  Need to know how he is doing.

## 2016-12-13 NOTE — Telephone Encounter (Signed)
Given persistent symptoms, I would like to refer him to pulmonary for further evaluation and question treatment recs.  If agreeable, let me know and I will place the order.

## 2016-12-13 NOTE — Telephone Encounter (Addendum)
Patient advised of below. He is  still having  cough off and on , productive in am light pinkish color  in am . Finished azithromycin, patient states overall feels better.

## 2016-12-13 NOTE — Telephone Encounter (Signed)
Left message to call.

## 2016-12-13 NOTE — Telephone Encounter (Signed)
See f/u message.  Dr Genevive Bi reviewed the scan and recommended a 3 month f/u CT chest.

## 2016-12-14 NOTE — Telephone Encounter (Signed)
Patient advised of below and he is ok with referral to pulmonology.

## 2016-12-15 ENCOUNTER — Other Ambulatory Visit: Payer: Self-pay | Admitting: Internal Medicine

## 2016-12-15 DIAGNOSIS — R042 Hemoptysis: Secondary | ICD-10-CM

## 2016-12-15 DIAGNOSIS — R05 Cough: Secondary | ICD-10-CM

## 2016-12-15 DIAGNOSIS — R9389 Abnormal findings on diagnostic imaging of other specified body structures: Secondary | ICD-10-CM

## 2016-12-15 DIAGNOSIS — R059 Cough, unspecified: Secondary | ICD-10-CM

## 2016-12-15 NOTE — Progress Notes (Signed)
Order placed for pulmonary referral.  

## 2016-12-15 NOTE — Telephone Encounter (Signed)
Order placed for pulmonary referral.  

## 2016-12-22 ENCOUNTER — Ambulatory Visit (INDEPENDENT_AMBULATORY_CARE_PROVIDER_SITE_OTHER): Payer: PPO | Admitting: Vascular Surgery

## 2016-12-22 ENCOUNTER — Encounter (INDEPENDENT_AMBULATORY_CARE_PROVIDER_SITE_OTHER): Payer: Self-pay | Admitting: Vascular Surgery

## 2016-12-22 VITALS — BP 145/73 | HR 69 | Resp 16 | Ht 67.0 in | Wt 178.0 lb

## 2016-12-22 DIAGNOSIS — I712 Thoracic aortic aneurysm, without rupture: Secondary | ICD-10-CM | POA: Diagnosis not present

## 2016-12-22 DIAGNOSIS — I480 Paroxysmal atrial fibrillation: Secondary | ICD-10-CM | POA: Diagnosis not present

## 2016-12-22 DIAGNOSIS — I10 Essential (primary) hypertension: Secondary | ICD-10-CM

## 2016-12-22 DIAGNOSIS — E119 Type 2 diabetes mellitus without complications: Secondary | ICD-10-CM

## 2016-12-22 DIAGNOSIS — I7121 Aneurysm of the ascending aorta, without rupture: Secondary | ICD-10-CM | POA: Insufficient documentation

## 2016-12-22 NOTE — Assessment & Plan Note (Signed)
CT scan of the chest which I have independently reviewed. This demonstrated a 4.1 cm x 4.1 cm ascending aortic aneurysm. The transverse arch and the descending aorta down to the visualized portions of the upper abdominal aorta all appeared reasonably normal in caliber with mild to moderate atherosclerotic changes. Given his age of 47 and the reasonably small size of the enlargement, the likelihood of this causing future problems is reasonably small. Nonetheless, it is appropriate to check him on annual intervals with CT scan and less worsening problems develop in the interim. I discussed the pathophysiology and natural history of aortic aneurysms. The patient voices his understanding. Blood pressure control remains very important.

## 2016-12-22 NOTE — Assessment & Plan Note (Signed)
Follows with cardiology. Remains on anticoagulation.

## 2016-12-22 NOTE — Assessment & Plan Note (Signed)
blood pressure control important in reducing the progression of atherosclerotic disease and aneurysmal degeneration. On appropriate oral medications.  

## 2016-12-22 NOTE — Patient Instructions (Signed)
Thoracic Aortic Aneurysm An aneurysm is a bulge in an artery. It happens when blood pushes up against a weakened or damaged artery wall. A thoracic aortic aneurysm is an aneurysm that occurs in the first part of the aorta, between the heart and the diaphragm. The aorta is the main artery of the body. It supplies blood from the heart to the rest of the body. Some aneurysms may not cause symptoms or problems. However, the major concern with a thoracic aortic aneurysm is that it can enlarge and burst (rupture), or blood can flow between the layers of the wall of the aorta through a tear (aorticdissection). Both of these conditions can cause bleeding inside the body and can be life-threatening if they are not diagnosed and treated right away. What are the causes? The exact cause of this condition is not known. What increases the risk? The following factors may make you more likely to develop this condition:  Being age 100 or older.  Having a hardening of the arteries caused by the buildup of fat and other substances in the lining of a blood vessel (arteriosclerosis).  Having inflammation of the walls of an artery (arteritis).  Having a genetic disease that weakens the body's connective tissue, such as Marfan syndrome.  Having an injury or trauma to the aorta.  Having an infection that is caused by bacteria, such as syphilis or staphylococcus, in the wall of the aorta (infectious aortitis).  Having high blood pressure (hypertension).  Being male.  Being white (Caucasian).  Having high cholesterol.  Having a family history of aneurysms.  Using tobacco.  Having chronic obstructive pulmonary disease (COPD). What are the signs or symptoms? Symptoms of this condition vary depending on the size and rate of growth of the aneurysm. Most grow slowly and do not cause any symptoms. When symptoms do occur, they may include:  Pain in the chest, back, sides, or abdomen. The pain may vary in  intensity. A sudden onset of severe pain may indicate that the aneurysm has ruptured.  Hoarseness.  Cough.  Shortness of breath.  Swallowing problems.  Swelling in the face, arms, or legs.  Fever.  Unexplained weight loss. How is this diagnosed? This condition may be diagnosed with:  An ultrasound.  X-rays.  A CT scan.  An MRI.  Tests to check the arteries for damage or blockages (angiogram). Most unruptured thoracic aortic aneurysms cause no symptoms, so they are often found during exams for other conditions. How is this treated? Treatment for this condition depends on:  The size of the aneurysm.  How fast the aneurysm is growing.  Your age.  Risk factors for rupture. Aneurysms that are smaller than 2.2 inches (5.5 cm) may be managed by using medicines to control blood pressure, manage pain, or fight infection. You may need regular monitoring to see if the aneurysm is getting bigger. Your health care provider may recommend that you have an ultrasound every year or every 6 months. How often you need to have an ultrasound depends on the size of the aneurysm, how fast it is growing, and whether you have a family history of aneurysms. Surgical repair may be needed if your aneurysm is larger than 2.2 inches or if it is growing quickly. Follow these instructions at home: Eating and drinking   Eat a healthy diet. Your health care provider may recommend that you:  Lower your salt (sodium) intake. In some people, too much salt can raise blood pressure and increase the risk of  thoracic aortic aneurysm.  Avoid foods that are high in saturated fat and cholesterol, such as red meat and dairy.  Eat a diet that is low in sugar.  Increase your fiber intake by including whole grains, vegetables, and fruits in your diet. Eating these foods may help to lower blood pressure.  Limit or avoid alcohol as recommended by your health care provider. Lifestyle   Follow instructions from  your health care provider about healthy lifestyle habits. Your health care provider may recommend that you:  Do not use any products that contain nicotine or tobacco, such as cigarettes and e-cigarettes. If you need help quitting, ask your health care provider.  Keep your blood pressure within normal limits. The target limit for most people is below 120/80. Check your blood pressure regularly. If it is high, ask your health care provider about ways that you can control it.  Keep your blood sugar (glucose) level and cholesterol levels within normal limits. Target limits for most people are:  Blood glucose level: Less than 100 mg/dL.  Total cholesterol level: Less than 200 mg/dL.  Maintain a healthy weight. Activity   Stay physically active and exercise regularly. Talk with your health care provider about how often you should exercise and ask which types of exercise are safe for you.  Avoid heavy lifting and activities that take a lot of effort (are strenuous). Ask your health care provider what activities are safe for you. General instructions   Keep all follow-up visits as told by your health care provider. This is important.  Talk with your health care provider about regular screenings to see if the aneurysm is getting bigger.  Take over-the-counter and prescription medicines only as told by your health care provider. Contact a health care provider if:  You have discomfort in your upper back, neck, or abdomen.  You have trouble swallowing.  You have a cough or hoarseness.  You have a family history of aneurysms.  You have unexplained weight loss. Get help right away if:  You have sudden, severe pain in your upper back and abdomen. This pain may move into your chest and arms.  You have shortness of breath.  You have a fever. This information is not intended to replace advice given to you by your health care provider. Make sure you discuss any questions you have with your  health care provider. Document Released: 09/25/2005 Document Revised: 07/07/2016 Document Reviewed: 07/07/2016 Elsevier Interactive Patient Education  2017 [employer].

## 2016-12-22 NOTE — Progress Notes (Signed)
Patient ID: Kyle Richardson, male   DOB: September 16, 1928, 83 y.o.   MRN: [id number]  Chief Complaint  Patient presents with  . New Patient (Initial Visit)    HPI Kyle Richardson is a 83 y.o. male.  I am asked to see the patient by Dr. Nicki Reaper for evaluation of an aneurysm.  The patient reports Atrial fibrillation several months ago. It sounds like he wore a Holter monitor and has seen a cardiologist. He was started on anticoagulant, and several weeks later developed what sounds like some mild hemoptysis. He really did not have any chest pain. He does not have any anginal symptoms. He has had multiple evaluations and has an upcoming pulmonologist referral, but among his tesst were a CT scan of the chest which I have independently reviewed. This demonstrated a 4.1 cm x 4.1 cm ascending aortic aneurysm. The transverse arch and the descending aorta down to the visualized portions of the upper abdominal aorta all appeared reasonably normal in caliber with mild to moderate atherosclerotic changes. He denies any chest or back pain. He denies signs of peripheral embolization.   Past Medical History:  Diagnosis Date  . 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  s/p PTCA of the mid RCA 1/96, also  s/p  CABG x 4 7/96  . Degenerative disc disease   . Diabetes mellitus ([place])   . Glaucoma   . Hypercholesterolemia   . Hypertension   . Migraine headache    history of  . Osteoarthritis   . Sleep apnea     Past Surgical History:  Procedure Laterality Date  . APPENDECTOMY    . CHOLECYSTECTOMY     Removed  . CORONARY ARTERY BYPASS GRAFT  7/96   x4  . HEMORRHOID SURGERY    . HERNIA REPAIR    . UPP and tonsillectomy  1/86    Family History  Problem Relation Age of Onset  . Heart disease Mother     died age 44  . Heart disease Father     and other vascular disease  . Diabetes      four siblings  . Heart disease Brother     s/p CABG  . Colon cancer Neg Hx   .  Prostate cancer Neg Hx      Social History Social History  Substance Use Topics  . Smoking status: Never Smoker  . Smokeless tobacco: Never Used  . Alcohol use No  No IVDU  Allergies  Allergen Reactions  . Codeine   . Flomax [Tamsulosin Hcl]   . Lamisil [Terbinafine Hcl]   . Sulfa Antibiotics     Current Outpatient Prescriptions  Medication Sig Dispense Refill  . ACCU-CHEK AVIVA PLUS test strip CHECK BLOOD SUGAR ONCE DAILY. 50 each 2  . ACCU-CHEK SOFTCLIX LANCETS lancets USE AS DIRECTED 100 each 1  . apixaban (ELIQUIS) 5 MG TABS tablet Take by mouth.    Marland Kitchen atorvastatin (LIPITOR) 20 MG tablet TAKE 1 TABLET BY MOUTH EVERY DAY. 30 tablet 5  . azithromycin (ZITHROMAX) 250 MG tablet Take 2 tablets x 1 day and then one per day for four more days. 6 tablet 0  . calcium citrate-vitamin D (CITRACAL+D) 315-200 MG-UNIT per tablet Take 1 tablet by mouth daily.    . Chlorpheniramine Maleate (ALLERGY RELIEF PO) Take by mouth daily.    . chlorpheniramine-HYDROcodone (TUSSIONEX PENNKINETIC ER) 10-8 MG/5ML SUER Take 5 mLs by mouth every 12 (twelve) hours as needed. [place]  mL 0  . FLUoxetine (PROZAC) 10 MG capsule TAKE 1 CAPSULE BY MOUTH EVERY DAY. 30 capsule 3  . fluticasone (FLONASE) 50 MCG/ACT nasal spray SPRAY 2 PUFFS IN EACH NOSTRIL ONCE A DAY 16 g 1  . latanoprost (XALATAN) 0.005 % ophthalmic solution Place 1 drop into both eyes at bedtime.    . Multiple Vitamin (MULTIVITAMIN) tablet Take 1 tablet by mouth daily.    . Multiple Vitamins-Minerals (PRESERVISION AREDS 2) CAPS Take by mouth daily.    . mupirocin ointment (BACTROBAN) 2 % Apply to affected areas bid 22 g 0  . nitroGLYCERIN (NITROSTAT) 0.4 MG SL tablet Place 0.4 mg under the tongue every 5 (five) minutes as needed. As directed    . pantoprazole (PROTONIX) 40 MG tablet Take 40 mg by mouth daily.     No current facility-administered medications for this visit.       REVIEW OF SYSTEMS (Negative unless checked)  Constitutional:  [] Weight loss  [] Fever  [] Chills Cardiac: [] Chest pain   [] Chest pressure   [x] Palpitations   [] Shortness of breath when laying flat   [] Shortness of breath at rest   [] Shortness of breath with exertion. Vascular:  [] Pain in legs with walking   [] Pain in legs at rest   [] Pain in legs when laying flat   [] Claudication   [] Pain in feet when walking  [] Pain in feet at rest  [] Pain in feet when laying flat   [] History of DVT   [] Phlebitis   [] Swelling in legs   [] Varicose veins   [] Non-healing ulcers Pulmonary:   [] Uses home oxygen   [x] Productive cough   [x] Hemoptysis   [] Wheeze  [] COPD   [] Asthma Neurologic:  [] Dizziness  [] Blackouts   [] Seizures   [] History of stroke   [] History of TIA  [] Aphasia   [] Temporary blindness   [] Dysphagia   [] Weakness or numbness in arms   [] Weakness or numbness in legs Musculoskeletal:  [] Arthritis   [] Joint swelling   [] Joint pain   [] Low back pain Hematologic:  [] Easy bruising  [] Easy bleeding   [] Hypercoagulable state   [] Anemic  [] Hepatitis Gastrointestinal:  [] Blood in stool   [] Vomiting blood  [] Gastroesophageal reflux/heartburn   [] Abdominal pain Genitourinary:  [] Chronic kidney disease   [] Difficult urination  [] Frequent urination  [] Burning with urination   [] Hematuria Skin:  [] Rashes   [] Ulcers   [] Wounds Psychological:  [] History of anxiety   []  History of major depression.    Physical Exam BP (!) 145/73   Pulse 69   Resp 16   Ht 5\' 7"  (1.702 m)   Wt 178 lb (80.7 kg)   BMI 27.88 kg/m  Gen:  WD/WN, NAD. Appears younger than stated age. Head: [place]/AT, No temporalis wasting.  Ear/Nose/Throat: Hearing grossly intact, nares w/o erythema or drainage, oropharynx w/o Erythema/Exudate Eyes: Conjunctiva clear, sclera non-icteric  Neck: trachea midline.  No bruit or JVD.  Pulmonary:  Good air movement, clear to auscultation bilaterally.  Cardiac: RRR, normal S1, S2, no Murmurs, rubs or gallops. No arrhythmia detected on auscultation today. Vascular:  Vessel  Right Left  Radial Palpable Palpable                                   Gastrointestinal: soft, non-tender/non-distended. No Increased aortic impulse Musculoskeletal: M/S 5/5 throughout.  Extremities without ischemic changes.  No deformity or atrophy.  Neurologic: Sensation grossly intact in extremities.  Symmetrical.  Speech is fluent. Motor  exam as listed above. Psychiatric: Judgment intact, Mood & affect appropriate for pt's clinical situation. Dermatologic: No rashes or ulcers noted.  No cellulitis or open wounds. Lymph : No Cervical, Axillary, or Inguinal lymphadenopathy.   Radiology Ct Chest W Contrast  Result Date: 12/04/2016 CLINICAL DATA:  Productive cough with blood-tinged sputum. EXAM: CT CHEST WITH CONTRAST TECHNIQUE: Multidetector CT imaging of the chest was performed during intravenous contrast administration. CONTRAST:  Noncontrast COMPARISON:  Chest radiograph October 23, 2016 FINDINGS: Cardiovascular: Ascending thoracic aortic diameter measures 4.1 x 4.1 cm, measured on axial slice 60 series 2. There is no thoracic aortic dissection. There are scattered foci of atherosclerotic calcification visualized great vessels as well as scattered foci of atherosclerotic calcification in the thoracic aorta. Patient is status post coronary artery bypass grafting. Pericardium is not appreciably thickened. There is no major vessel pulmonary embolus. Note that the study was not timed to optimize pulmonary arterial vascular visualization. Mediastinum/Nodes: Thyroid appears unremarkable. There are multiple subcentimeter mediastinal lymph nodes. No adenopathy is apparent by size criteria. Lungs/Pleura: There are areas of mild lower lobe and inferior lingular atelectatic type change. There are several ill-defined areas of opacity in the right middle lobe concerning for patchy pneumonia in this area. There is ill-defined ground-glass type opacity in the anterior segment of the left upper lobe  measuring 2.7 x 2.5 cm. A second somewhat ill-defined area of ground-glass type opacity is seen in the posterior segment of the right upper lobe measuring 2.1 x 1.5 cm. Upper Abdomen: There is atherosclerotic calcification in the visualized upper abdominal aorta. Gallbladder is absent. Visualized upper abdominal structures otherwise appear unremarkable. Musculoskeletal: There is degenerative change in the thoracic spine. There are no blastic or lytic bone lesions. IMPRESSION: There are patchy areas of ill-defined opacity in the medial segment of the right middle lobe, suspicious for pneumonia. There is ill-defined ground-glass opacity in each upper lobe region. These areas may represent foci of pneumonia. Atypical neoplasm is possible. Given the size of these areas of ground-glass type opacity, follow-up noncontrast CT in approximately 3 months is felt to be warranted. No adenopathy evident. Prominent at ascending thoracic aorta with a measured diameter 4.1 x 4.1 cm. Recommend annual imaging followup by CTA or MRA. This recommendation follows 2010 ACCF/AHA/AATS/ACR/ASA/SCA/SCAI/SIR/STS/SVM Guidelines for the Diagnosis and Management of Patients with Thoracic Aortic Disease. Circulation. 2010; 121: L798-X211. No dissection. Scattered foci of atherosclerotic calcification. These results will be called to the ordering clinician or representative by the Radiologist Assistant, and communication documented in the PACS or zVision Dashboard. Electronically Signed   By: Lowella Grip III M.D.   On: 12/04/2016 14:10    Labs Recent Results (from the past 2160 hour(s))  I-STAT creatinine     Status: None   Collection Time: 12/04/16  1:38 PM  Result Value Ref Range   Creatinine, Ser 0.90 0.61 - 1.24 mg/dL    Assessment/Plan:  A-fib ([place]) Follows with cardiology. Remains on anticoagulation.  Hypertension blood pressure control important in reducing the progression of atherosclerotic disease and aneurysmal  degeneration. On appropriate oral medications.   Diabetes mellitus blood glucose control important in reducing the progression of atherosclerotic disease. Also, involved in wound healing. On appropriate medications.   Ascending aortic aneurysm ([place]) CT scan of the chest which I have independently reviewed. This demonstrated a 4.1 cm x 4.1 cm ascending aortic aneurysm. The transverse arch and the descending aorta down to the visualized portions of the upper abdominal aorta all appeared reasonably normal in caliber  with mild to moderate atherosclerotic changes. Given his age of 37 and the reasonably small size of the enlargement, the likelihood of this causing future problems is reasonably small. Nonetheless, it is appropriate to check him on annual intervals with CT scan and less worsening problems develop in the interim. I discussed the pathophysiology and natural history of aortic aneurysms. The patient voices his understanding. Blood pressure control remains very important.      Leotis Pain 12/22/2016, 2:18 PM   This note was created with Dragon medical transcription system.  Any errors from dictation are unintentional.

## 2016-12-22 NOTE — Assessment & Plan Note (Signed)
blood glucose control important in reducing the progression of atherosclerotic disease. Also, involved in wound healing. On appropriate medications.  

## 2016-12-25 ENCOUNTER — Encounter: Payer: Self-pay | Admitting: Internal Medicine

## 2016-12-26 ENCOUNTER — Other Ambulatory Visit: Payer: Self-pay | Admitting: Specialist

## 2016-12-26 DIAGNOSIS — G4733 Obstructive sleep apnea (adult) (pediatric): Secondary | ICD-10-CM | POA: Diagnosis not present

## 2016-12-26 DIAGNOSIS — R918 Other nonspecific abnormal finding of lung field: Secondary | ICD-10-CM | POA: Diagnosis not present

## 2016-12-26 DIAGNOSIS — R05 Cough: Secondary | ICD-10-CM | POA: Diagnosis not present

## 2017-01-02 ENCOUNTER — Other Ambulatory Visit: Payer: Self-pay | Admitting: Internal Medicine

## 2017-01-02 DIAGNOSIS — M79671 Pain in right foot: Secondary | ICD-10-CM | POA: Diagnosis not present

## 2017-01-02 DIAGNOSIS — L851 Acquired keratosis [keratoderma] palmaris et plantaris: Secondary | ICD-10-CM | POA: Diagnosis not present

## 2017-01-18 ENCOUNTER — Ambulatory Visit (INDEPENDENT_AMBULATORY_CARE_PROVIDER_SITE_OTHER): Payer: PPO | Admitting: Internal Medicine

## 2017-01-18 ENCOUNTER — Encounter: Payer: Self-pay | Admitting: Internal Medicine

## 2017-01-18 VITALS — BP 138/72 | HR 60 | Temp 98.6°F | Resp 14 | Ht 68.9 in | Wt 178.8 lb

## 2017-01-18 DIAGNOSIS — E7800 Pure hypercholesterolemia, unspecified: Secondary | ICD-10-CM | POA: Diagnosis not present

## 2017-01-18 DIAGNOSIS — R042 Hemoptysis: Secondary | ICD-10-CM

## 2017-01-18 DIAGNOSIS — Z0001 Encounter for general adult medical examination with abnormal findings: Secondary | ICD-10-CM | POA: Diagnosis not present

## 2017-01-18 DIAGNOSIS — I712 Thoracic aortic aneurysm, without rupture: Secondary | ICD-10-CM | POA: Diagnosis not present

## 2017-01-18 DIAGNOSIS — R0602 Shortness of breath: Secondary | ICD-10-CM | POA: Diagnosis not present

## 2017-01-18 DIAGNOSIS — I10 Essential (primary) hypertension: Secondary | ICD-10-CM

## 2017-01-18 DIAGNOSIS — I2510 Atherosclerotic heart disease of native coronary artery without angina pectoris: Secondary | ICD-10-CM

## 2017-01-18 DIAGNOSIS — E118 Type 2 diabetes mellitus with unspecified complications: Secondary | ICD-10-CM | POA: Diagnosis not present

## 2017-01-18 DIAGNOSIS — G43809 Other migraine, not intractable, without status migrainosus: Secondary | ICD-10-CM | POA: Diagnosis not present

## 2017-01-18 DIAGNOSIS — H532 Diplopia: Secondary | ICD-10-CM | POA: Diagnosis not present

## 2017-01-18 DIAGNOSIS — I7121 Aneurysm of the ascending aorta, without rupture: Secondary | ICD-10-CM

## 2017-01-18 DIAGNOSIS — I480 Paroxysmal atrial fibrillation: Secondary | ICD-10-CM | POA: Diagnosis not present

## 2017-01-18 DIAGNOSIS — Z0000 Encounter for general adult medical examination without abnormal findings: Secondary | ICD-10-CM

## 2017-01-18 DIAGNOSIS — E119 Type 2 diabetes mellitus without complications: Secondary | ICD-10-CM

## 2017-01-18 NOTE — Progress Notes (Signed)
Patient ID: Kyle Richardson, male   DOB: 01-24-1928, 83 y.o.   MRN: [id number]   Subjective:    Patient ID: Kyle Richardson, male    DOB: 14-Jan-1928, 83 y.o.   MRN: [id number]  HPI  Patient with past history of CAD, diabetes, hypertension and hypercholesterolemia.  He comes in today to follow up on these issues as well as for a complete physical exam.  He is no longer coughing up pink tinged sputum.  Had CT scan.  Saw pulmonary.  Planning for f/u CT chest in 3 months.  He reports sob with exertion.  No chest pain.  No acid reflux.  No abdominal pain.  He does report having 3 episodes in the last two weeks of change in vision.  Two of the episodes involved seeing two of one object - one sitting on top of the other.  The third episode, the two objects were side by side.  He felt funny for a brief period when occurred and had to hold on to something.  The episodes each last approximately two minutes.  No other episodes.  Taking eliquis.  Has not missed any doses.  Eating and drinking well.  No headache.  No dizziness.     Past Medical History:  Diagnosis Date  . 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  s/p PTCA of the mid RCA 1/96, also  s/p  CABG x 4 7/96  . Degenerative disc disease   . Diabetes mellitus ([place])   . Glaucoma   . Hypercholesterolemia   . Hypertension   . Migraine headache    history of  . Osteoarthritis   . Sleep apnea    Past Surgical History:  Procedure Laterality Date  . APPENDECTOMY    . CHOLECYSTECTOMY     Removed  . CORONARY ARTERY BYPASS GRAFT  7/96   x4  . HEMORRHOID SURGERY    . HERNIA REPAIR    . UPP and tonsillectomy  1/86   Family History  Problem Relation Age of Onset  . Heart disease Mother     died age 2  . Heart disease Father     and other vascular disease  . Diabetes      four siblings  . Heart disease Brother     s/p CABG  . Colon cancer Neg Hx   . Prostate cancer Neg Hx    Social History   Social History    . Marital status: Married    Spouse name: N/A  . Number of children: 2  . Years of education: N/A   Social History Main Topics  . Smoking status: Never Smoker  . Smokeless tobacco: Never Used  . Alcohol use No  . Drug use: No  . Sexual activity: Not Currently   Other Topics Concern  . None   Social History Narrative  . None    Outpatient Encounter Prescriptions as of 01/18/2017  Medication Sig  . ACCU-CHEK AVIVA PLUS test strip CHECK BLOOD SUGAR ONCE DAILY.  Marland Kitchen ACCU-CHEK SOFTCLIX LANCETS lancets USE AS DIRECTED  . apixaban (ELIQUIS) 5 MG TABS tablet Take by mouth.  Marland Kitchen atorvastatin (LIPITOR) 20 MG tablet TAKE 1 TABLET BY MOUTH EVERY DAY.  . calcium citrate-vitamin D (CITRACAL+D) 315-200 MG-UNIT per tablet Take 1 tablet by mouth daily.  . Chlorpheniramine Maleate (ALLERGY RELIEF PO) Take by mouth daily.  . chlorpheniramine-HYDROcodone (TUSSIONEX PENNKINETIC ER) 10-8 MG/5ML SUER Take 5 mLs by mouth every 12 (twelve) hours  as needed.  Marland Kitchen FLUoxetine (PROZAC) 10 MG capsule TAKE 1 CAPSULE BY MOUTH EVERY DAY.  . fluticasone (FLONASE) 50 MCG/ACT nasal spray SPRAY 2 PUFFS IN EACH NOSTRIL ONCE A DAY  . latanoprost (XALATAN) 0.005 % ophthalmic solution Place 1 drop into both eyes at bedtime.  . metoprolol succinate (TOPROL-XL) 50 MG 24 hr tablet Take 50 mg by mouth daily. Take with or immediately following a meal.  . Multiple Vitamin (MULTIVITAMIN) tablet Take 1 tablet by mouth daily.  . Multiple Vitamins-Minerals (PRESERVISION AREDS 2) CAPS Take by mouth daily.  . mupirocin ointment (BACTROBAN) 2 % Apply to affected areas bid  . nitroGLYCERIN (NITROSTAT) 0.4 MG SL tablet Place 0.4 mg under the tongue every 5 (five) minutes as needed. As directed  . pantoprazole (PROTONIX) 40 MG tablet Take 40 mg by mouth daily.  . [DISCONTINUED] azithromycin (ZITHROMAX) 250 MG tablet Take 2 tablets x 1 day and then one per day for four more days.   No facility-administered encounter medications on file as  of 01/18/2017.     Review of Systems  Constitutional: Negative for appetite change and unexpected weight change.  HENT: Negative for congestion and sinus pressure.   Eyes: Positive for visual disturbance. Negative for pain.  Respiratory: Positive for shortness of breath. Negative for cough and chest tightness.   Cardiovascular: Negative for chest pain, palpitations and leg swelling.  Gastrointestinal: Negative for abdominal pain, diarrhea, nausea and vomiting.  Genitourinary: Negative for difficulty urinating and dysuria.  Musculoskeletal: Negative for back pain and joint swelling.  Skin: Negative for color change and rash.  Neurological: Negative for dizziness, light-headedness and headaches.  Hematological: Negative for adenopathy. Does not bruise/bleed easily.  Psychiatric/Behavioral: Negative for agitation and dysphoric mood.       Objective:    Physical Exam  Constitutional: He is oriented to person, place, and time. He appears well-developed and well-nourished. No distress.  HENT:  Head: Normocephalic and atraumatic.  Nose: Nose normal.  Mouth/Throat: Oropharynx is clear and moist. No oropharyngeal exudate.  Eyes: Conjunctivae are normal. Right eye exhibits no discharge. Left eye exhibits no discharge.  Neck: Neck supple. No thyromegaly present.  Cardiovascular: Normal rate and regular rhythm.   Pulmonary/Chest: Breath sounds normal. No respiratory distress. He has no wheezes.  Abdominal: Soft. Bowel sounds are normal. There is no tenderness.  Genitourinary: Rectum normal and penis normal.  Musculoskeletal: He exhibits no edema or tenderness.  Lymphadenopathy:    He has no cervical adenopathy.  Neurological: He is alert and oriented to person, place, and time.  Skin: Skin is warm and dry. No rash noted. No erythema.  Psychiatric: He has a normal mood and affect. His behavior is normal.    BP 138/72 (BP Location: Left Arm, Patient Position: Sitting, Cuff Size: Normal)    Pulse 60   Temp 98.6 F (37 C) (Oral)   Resp 14   Ht 5' 8.9" (1.75 m)   Wt 178 lb 12.8 oz (81.1 kg)   SpO2 95%   BMI 26.48 kg/m  Wt Readings from Last 3 Encounters:  01/18/17 178 lb 12.8 oz (81.1 kg)  12/22/16 178 lb (80.7 kg)  11/06/16 179 lb 3.2 oz (81.3 kg)     Lab Results  Component Value Date   WBC 6.7 09/14/2016   HGB 14.8 09/14/2016   HCT 43.7 09/14/2016   PLT 174.0 09/14/2016   GLUCOSE 132 (H) 09/05/2016   CHOL 176 09/05/2016   TRIG 183.0 (H) 09/05/2016   HDL 44.40 09/05/2016  LDLDIRECT 68.0 12/21/2015   LDLCALC 95 09/05/2016   ALT 27 09/05/2016   AST 26 09/05/2016   NA 139 09/05/2016   K 4.6 09/05/2016   CL 104 09/05/2016   CREATININE 0.90 12/04/2016   BUN 20 09/05/2016   CO2 30 09/05/2016   TSH 2.29 05/05/2016   PSA 0.66 05/05/2016   INR 1.4 02/17/2014   HGBA1C 6.9 (H) 09/05/2016   MICROALBUR 3.7 (H) 05/05/2016    Ct Chest W Contrast  Result Date: 12/04/2016 CLINICAL DATA:  Productive cough with blood-tinged sputum. EXAM: CT CHEST WITH CONTRAST TECHNIQUE: Multidetector CT imaging of the chest was performed during intravenous contrast administration. CONTRAST:  Noncontrast COMPARISON:  Chest radiograph October 23, 2016 FINDINGS: Cardiovascular: Ascending thoracic aortic diameter measures 4.1 x 4.1 cm, measured on axial slice 60 series 2. There is no thoracic aortic dissection. There are scattered foci of atherosclerotic calcification visualized great vessels as well as scattered foci of atherosclerotic calcification in the thoracic aorta. Patient is status post coronary artery bypass grafting. Pericardium is not appreciably thickened. There is no major vessel pulmonary embolus. Note that the study was not timed to optimize pulmonary arterial vascular visualization. Mediastinum/Nodes: Thyroid appears unremarkable. There are multiple subcentimeter mediastinal lymph nodes. No adenopathy is apparent by size criteria. Lungs/Pleura: There are areas of mild lower lobe  and inferior lingular atelectatic type change. There are several ill-defined areas of opacity in the right middle lobe concerning for patchy pneumonia in this area. There is ill-defined ground-glass type opacity in the anterior segment of the left upper lobe measuring 2.7 x 2.5 cm. A second somewhat ill-defined area of ground-glass type opacity is seen in the posterior segment of the right upper lobe measuring 2.1 x 1.5 cm. Upper Abdomen: There is atherosclerotic calcification in the visualized upper abdominal aorta. Gallbladder is absent. Visualized upper abdominal structures otherwise appear unremarkable. Musculoskeletal: There is degenerative change in the thoracic spine. There are no blastic or lytic bone lesions. IMPRESSION: There are patchy areas of ill-defined opacity in the medial segment of the right middle lobe, suspicious for pneumonia. There is ill-defined ground-glass opacity in each upper lobe region. These areas may represent foci of pneumonia. Atypical neoplasm is possible. Given the size of these areas of ground-glass type opacity, follow-up noncontrast CT in approximately 3 months is felt to be warranted. No adenopathy evident. Prominent at ascending thoracic aorta with a measured diameter 4.1 x 4.1 cm. Recommend annual imaging followup by CTA or MRA. This recommendation follows 2010 ACCF/AHA/AATS/ACR/ASA/SCA/SCAI/SIR/STS/SVM Guidelines for the Diagnosis and Management of Patients with Thoracic Aortic Disease. Circulation. 2010; 121: N361-W431. No dissection. Scattered foci of atherosclerotic calcification. These results will be called to the ordering clinician or representative by the Radiologist Assistant, and communication documented in the PACS or zVision Dashboard. Electronically Signed   By: Lowella Grip III M.D.   On: 12/04/2016 14:10       Assessment & Plan:   Problem List Items Addressed This Visit    A-fib ([place])    Remains on eliquis.  Followed by cardiology.  In SR.          Relevant Medications   metoprolol succinate (TOPROL-XL) 50 MG 24 hr tablet   Other Relevant Orders   Ambulatory referral to Cardiology   Ascending aortic aneurysm ([place])    CT scan as outlined.  Saw Dr Lucky Cowboy.  Recommended f/u in CT in one year.  See note.        Relevant Medications   metoprolol succinate (TOPROL-XL)  50 MG 24 hr tablet   Blood-tinged sputum    Resolved.  CT chest as outlined.  Saw pulmonary.  Planning for f/u CT scan in 3 months.  Treated with levaquin.        CAD (coronary artery disease)    Followed by cardiology.  Describes sob with exertion.  EKG - SR with no acute ischemic changes.  Given known CAD and sob with exertion, will get him back in with cardiology for evaluation.  Also, with recent episodes of vision changes and double vision as outlined, will have cardiology evaluate for possible echo with bubble study.  Already on eliquis.        Relevant Medications   metoprolol succinate (TOPROL-XL) 50 MG 24 hr tablet   Other Relevant Orders   Ambulatory referral to Cardiology   Diabetes mellitus ([place])    Low carb diet and exercise.  Follow met b and a1c.        Double vision    Double vision episodes as outlined.  Three episodes in the last two weeks.  On eliquis.  Has a history of migraines.  These vision changes are different from his vision changes he has associated with migraines.  Discussed with his neurologist.  Will schedule for MRI and MRA/CTA.  Have cardiology evaluate for possible echo with bubble study.  He is current without symptoms.  Discussed ER evaluation.  He declines now.  Will be evaluated immediately if symptoms return.        Relevant Orders   Ambulatory referral to Cardiology   Health care maintenance    Physical today 01/18/17.  Sees Dr Yves Dill for prostate checks.  PSA 06/14/16 - .7.       Hypercholesterolemia    On lipitor.  Low cholesterol diet and exercise.  Follow lipid panel.   Lab Results  Component Value Date   CHOL 176 09/05/2016    HDL 44.40 09/05/2016   LDLCALC 95 09/05/2016   LDLDIRECT 68.0 12/21/2015   TRIG 183.0 (H) 09/05/2016   CHOLHDL 4 09/05/2016        Relevant Medications   metoprolol succinate (TOPROL-XL) 50 MG 24 hr tablet   Hypertension    Blood pressure doing well on my check.  Not orthostatic on exam.  Follow pressures.  Follow metabolic panel.        Relevant Medications   metoprolol succinate (TOPROL-XL) 50 MG 24 hr tablet   Migraine    Has a history of migraines.  His typical vision changes with his migraines are zigzag lines that move across his visual field.  This is different.  Discussed with his neurologist.  Pursue further w/up as outlined with MRI/MRA or CTA.  Continue eliquis.        Relevant Medications   metoprolol succinate (TOPROL-XL) 50 MG 24 hr tablet    Other Visit Diagnoses    SOB (shortness of breath) on exertion    -  Primary   Relevant Orders   EKG 12-Lead (Completed)      I spent 40 minutes with the patient and more than 50% of the time was spent in consultation regarding the above.  Time spent obtaining history and discussing current symptoms and issues.  Also time spent with examination and then discussing plans for treatment and further w/up.   Einar Pheasant, MD

## 2017-01-18 NOTE — Progress Notes (Signed)
Pre-visit discussion using our clinic review tool. No additional management support is needed unless otherwise documented below in the visit note.  

## 2017-01-19 ENCOUNTER — Telehealth: Payer: Self-pay | Admitting: Internal Medicine

## 2017-01-19 NOTE — Telephone Encounter (Signed)
Dr. Claris Gladden from [facility] neurology returned your call stated she can be reached at [phone] have them page at [facility]

## 2017-01-21 ENCOUNTER — Encounter: Payer: Self-pay | Admitting: Internal Medicine

## 2017-01-21 DIAGNOSIS — H532 Diplopia: Secondary | ICD-10-CM | POA: Insufficient documentation

## 2017-01-21 NOTE — Assessment & Plan Note (Signed)
Has a history of migraines.  His typical vision changes with his migraines are zigzag lines that move across his visual field.  This is different.  Discussed with his neurologist.  Pursue further w/up as outlined with MRI/MRA or CTA.  Continue eliquis.

## 2017-01-21 NOTE — Assessment & Plan Note (Signed)
Physical today 01/18/17.  Sees Dr Yves Dill for prostate checks.  PSA 06/14/16 - .7.

## 2017-01-21 NOTE — Assessment & Plan Note (Signed)
Followed by cardiology.  Describes sob with exertion.  EKG - SR with no acute ischemic changes.  Given known CAD and sob with exertion, will get him back in with cardiology for evaluation.  Also, with recent episodes of vision changes and double vision as outlined, will have cardiology evaluate for possible echo with bubble study.  Already on eliquis.

## 2017-01-21 NOTE — Assessment & Plan Note (Signed)
CT scan as outlined.  Saw Dr Lucky Cowboy.  Recommended f/u in CT in one year.  See note.

## 2017-01-21 NOTE — Assessment & Plan Note (Signed)
Remains on eliquis.  Followed by cardiology.  In SR.

## 2017-01-21 NOTE — Telephone Encounter (Signed)
Late entry.  Spoke to Dr Chrystie Nose.  Discussed pts double vision episodes.  See note.  Pt notified.  Will schedule mri and mra/cta.  Continue eliquis.  Earlier appt with Dr Chrystie Nose.

## 2017-01-21 NOTE — Assessment & Plan Note (Signed)
Resolved.  CT chest as outlined.  Saw pulmonary.  Planning for f/u CT scan in 3 months.  Treated with levaquin.

## 2017-01-21 NOTE — Assessment & Plan Note (Signed)
Blood pressure doing well on my check.  Not orthostatic on exam.  Follow pressures.  Follow metabolic panel.

## 2017-01-21 NOTE — Assessment & Plan Note (Signed)
Low carb diet and exercise.  Follow met b and a1c.   

## 2017-01-21 NOTE — Assessment & Plan Note (Signed)
On lipitor.  Low cholesterol diet and exercise.  Follow lipid panel.   Lab Results  Component Value Date   CHOL 176 09/05/2016   HDL 44.40 09/05/2016   LDLCALC 95 09/05/2016   LDLDIRECT 68.0 12/21/2015   TRIG 183.0 (H) 09/05/2016   CHOLHDL 4 09/05/2016

## 2017-01-21 NOTE — Assessment & Plan Note (Signed)
Double vision episodes as outlined.  Three episodes in the last two weeks.  On eliquis.  Has a history of migraines.  These vision changes are different from his vision changes he has associated with migraines.  Discussed with his neurologist.  Will schedule for MRI and MRA/CTA.  Have cardiology evaluate for possible echo with bubble study.  He is current without symptoms.  Discussed ER evaluation.  He declines now.  Will be evaluated immediately if symptoms return.

## 2017-01-23 ENCOUNTER — Other Ambulatory Visit: Payer: Self-pay | Admitting: Internal Medicine

## 2017-01-23 DIAGNOSIS — H532 Diplopia: Secondary | ICD-10-CM

## 2017-01-23 NOTE — Progress Notes (Signed)
Orders placed for MRI /MRA.  Ordered at [place] because this is where they have open MRI.

## 2017-01-24 ENCOUNTER — Telehealth: Payer: Self-pay | Admitting: Internal Medicine

## 2017-01-24 DIAGNOSIS — I2510 Atherosclerotic heart disease of native coronary artery without angina pectoris: Secondary | ICD-10-CM | POA: Diagnosis not present

## 2017-01-24 DIAGNOSIS — E785 Hyperlipidemia, unspecified: Secondary | ICD-10-CM | POA: Diagnosis not present

## 2017-01-24 DIAGNOSIS — R001 Bradycardia, unspecified: Secondary | ICD-10-CM | POA: Diagnosis not present

## 2017-01-24 DIAGNOSIS — H539 Unspecified visual disturbance: Secondary | ICD-10-CM | POA: Diagnosis not present

## 2017-01-24 DIAGNOSIS — I10 Essential (primary) hypertension: Secondary | ICD-10-CM | POA: Diagnosis not present

## 2017-01-24 DIAGNOSIS — E119 Type 2 diabetes mellitus without complications: Secondary | ICD-10-CM | POA: Diagnosis not present

## 2017-01-24 DIAGNOSIS — I639 Cerebral infarction, unspecified: Secondary | ICD-10-CM | POA: Diagnosis not present

## 2017-01-24 DIAGNOSIS — I480 Paroxysmal atrial fibrillation: Secondary | ICD-10-CM | POA: Diagnosis not present

## 2017-01-24 NOTE — Telephone Encounter (Signed)
[facility] called and stated that they are unable to pull pt's EKG from the [facility] site. Can we please fax it over. Thank you!  Fax @ [phone]

## 2017-01-25 NOTE — Telephone Encounter (Signed)
Faxed yesterday

## 2017-01-30 DIAGNOSIS — R001 Bradycardia, unspecified: Secondary | ICD-10-CM | POA: Diagnosis not present

## 2017-02-04 ENCOUNTER — Ambulatory Visit
Admission: RE | Admit: 2017-02-04 | Discharge: 2017-02-04 | Disposition: A | Discharge status: Home / Self Care | Payer: PPO | Source: Ambulatory Visit | Attending: Internal Medicine | Admitting: Internal Medicine

## 2017-02-04 DIAGNOSIS — H532 Diplopia: Secondary | ICD-10-CM

## 2017-02-04 MED ORDER — GADOBENATE DIMEGLUMINE 529 MG/ML IV SOLN
16.0000 mL | Freq: Once | INTRAVENOUS | Status: AC | PRN
  Administered 2017-02-04: 16 mL via INTRAVENOUS

## 2017-02-05 ENCOUNTER — Telehealth: Payer: Self-pay

## 2017-02-05 NOTE — Telephone Encounter (Signed)
-----   Message from Einar Pheasant, MD sent at 02/04/2017  9:58 PM EDT ----- Please call pt and notify him that his MRI/MRA reveals no abnormality noted that gives an explanation for the double vision.  MRI/MRA - ok.  Make sure he has an appt scheduled with Dr Ubaldo Glassing.  If has appt, need to know when appt is scheduled.  Thanks

## 2017-02-05 NOTE — Telephone Encounter (Signed)
Left message to return call to our office.  

## 2017-02-07 NOTE — Telephone Encounter (Signed)
Called both numbers l/m to call office

## 2017-02-09 NOTE — Telephone Encounter (Signed)
Pt requested to be called at [phone]

## 2017-02-09 NOTE — Telephone Encounter (Signed)
Pt came into the office and was given results. Pt gave verbal understanding. He will try to schedule a earlier appt than June.

## 2017-02-09 NOTE — Telephone Encounter (Signed)
Left message for patient to return call back.  

## 2017-02-13 DIAGNOSIS — E119 Type 2 diabetes mellitus without complications: Secondary | ICD-10-CM | POA: Diagnosis not present

## 2017-02-13 DIAGNOSIS — I639 Cerebral infarction, unspecified: Secondary | ICD-10-CM | POA: Diagnosis not present

## 2017-02-13 DIAGNOSIS — I480 Paroxysmal atrial fibrillation: Secondary | ICD-10-CM | POA: Diagnosis not present

## 2017-02-13 DIAGNOSIS — I10 Essential (primary) hypertension: Secondary | ICD-10-CM | POA: Diagnosis not present

## 2017-02-13 DIAGNOSIS — I2510 Atherosclerotic heart disease of native coronary artery without angina pectoris: Secondary | ICD-10-CM | POA: Diagnosis not present

## 2017-02-13 DIAGNOSIS — E785 Hyperlipidemia, unspecified: Secondary | ICD-10-CM | POA: Diagnosis not present

## 2017-02-19 DIAGNOSIS — B351 Tinea unguium: Secondary | ICD-10-CM | POA: Diagnosis not present

## 2017-02-19 DIAGNOSIS — L851 Acquired keratosis [keratoderma] palmaris et plantaris: Secondary | ICD-10-CM | POA: Diagnosis not present

## 2017-02-19 DIAGNOSIS — E119 Type 2 diabetes mellitus without complications: Secondary | ICD-10-CM | POA: Diagnosis not present

## 2017-02-22 DIAGNOSIS — I639 Cerebral infarction, unspecified: Secondary | ICD-10-CM

## 2017-02-22 HISTORY — DX: Cerebral infarction, unspecified: I63.9

## 2017-02-27 DIAGNOSIS — H401131 Primary open-angle glaucoma, bilateral, mild stage: Secondary | ICD-10-CM | POA: Diagnosis not present

## 2017-03-07 ENCOUNTER — Other Ambulatory Visit: Payer: Self-pay | Admitting: Internal Medicine

## 2017-03-16 DIAGNOSIS — I6930 Unspecified sequelae of cerebral infarction: Secondary | ICD-10-CM | POA: Diagnosis not present

## 2017-03-28 ENCOUNTER — Ambulatory Visit
Admission: RE | Admit: 2017-03-28 | Discharge: 2017-03-28 | Disposition: A | Discharge status: Home / Self Care | Payer: PPO | Source: Ambulatory Visit | Attending: Specialist | Admitting: Specialist

## 2017-03-28 DIAGNOSIS — Z951 Presence of aortocoronary bypass graft: Secondary | ICD-10-CM | POA: Insufficient documentation

## 2017-03-28 DIAGNOSIS — Z9049 Acquired absence of other specified parts of digestive tract: Secondary | ICD-10-CM | POA: Insufficient documentation

## 2017-03-28 DIAGNOSIS — J439 Emphysema, unspecified: Secondary | ICD-10-CM | POA: Diagnosis not present

## 2017-03-28 DIAGNOSIS — R918 Other nonspecific abnormal finding of lung field: Secondary | ICD-10-CM | POA: Insufficient documentation

## 2017-03-28 DIAGNOSIS — I700 Atherosclerosis of aorta: Secondary | ICD-10-CM | POA: Diagnosis not present

## 2017-03-29 DIAGNOSIS — L570 Actinic keratosis: Secondary | ICD-10-CM | POA: Diagnosis not present

## 2017-03-29 DIAGNOSIS — Z08 Encounter for follow-up examination after completed treatment for malignant neoplasm: Secondary | ICD-10-CM | POA: Diagnosis not present

## 2017-03-29 DIAGNOSIS — Z85828 Personal history of other malignant neoplasm of skin: Secondary | ICD-10-CM | POA: Diagnosis not present

## 2017-03-29 DIAGNOSIS — X32XXXA Exposure to sunlight, initial encounter: Secondary | ICD-10-CM | POA: Diagnosis not present

## 2017-04-03 DIAGNOSIS — G4733 Obstructive sleep apnea (adult) (pediatric): Secondary | ICD-10-CM | POA: Diagnosis not present

## 2017-04-03 DIAGNOSIS — R918 Other nonspecific abnormal finding of lung field: Secondary | ICD-10-CM | POA: Diagnosis not present

## 2017-04-18 ENCOUNTER — Encounter: Payer: Self-pay | Admitting: Internal Medicine

## 2017-04-19 NOTE — Telephone Encounter (Signed)
Spoke with both the patient and his wife and explained that they need to notify his neurologist of the vision changes and they stated that they will try, but that is nearly impossible as she is hard to even attempt to talk to. I explained rationale that with strokes vision can be a factor.  They verbalized understanding and agreed to try and contact her.  They will let us know what cardiology says once they hear back from them and again verbalized he would be seen acutely if the episode happens again. thanks

## 2017-04-19 NOTE — Telephone Encounter (Signed)
Agree with need for evaluation.  I also would like for him to call his neurologist and let her know of the vision change.

## 2017-04-19 NOTE — Telephone Encounter (Signed)
Spoke with the patient.  He explained that the two incidents are the only two that have happened.  The first was Monday, he [place] to the church and got approximately 6 miles from his home and states that he could see, the sharpness disappeared.  He was about to pull over in a parking lot and all of a sudden his vision was normal again.  The incident lasted only 10-15 seconds.  The second was yesterday on his lawn mower, and a similar incident happened, only lastly the 10-15 seconds again.  No incidents today.  Doesn't feel like he needs to be seen acutely today.  Patient has a call and e-mail into [place] office (he is on vacation though).  He has an appointment scheduled there on Sept 5th already and has expressed having a stress test done then.  He stated he had a repeat CT done a few weeks ago to look at what he thinks was his lung with neurology, the next appt with them in the computer is January.  He is going to wait to see what cardiology says, but verbalized if the incident happened again he would go to the urgent/ED for medical care.  I also explained that you had a full schedule tomorrow and that you would be off next week at the beginning and he understood. Please advise.

## 2017-04-19 NOTE — Telephone Encounter (Signed)
Please call pt and let him know that I agree with cardiology evaluation for the sob.  Also, he should have a f/u scheduled with his neurologist.  If he is having vision issues, I recommend an evaluation with her.  Needs to call and let her know what is going on.  With these acute symptoms, I recommend going ahead with evaluation now to confirm nothing more acute going on.

## 2017-04-19 NOTE — Telephone Encounter (Signed)
Pt called this morning to make sure that Dr. Nicki Reaper reads this. Please advise, thank you!  Call pt @ [phone] or [phone]

## 2017-04-20 DIAGNOSIS — R42 Dizziness and giddiness: Secondary | ICD-10-CM | POA: Diagnosis not present

## 2017-04-30 DIAGNOSIS — M65341 Trigger finger, right ring finger: Secondary | ICD-10-CM | POA: Diagnosis not present

## 2017-05-02 ENCOUNTER — Other Ambulatory Visit: Payer: Self-pay | Admitting: Internal Medicine

## 2017-05-03 DIAGNOSIS — R001 Bradycardia, unspecified: Secondary | ICD-10-CM | POA: Diagnosis not present

## 2017-05-07 ENCOUNTER — Encounter: Payer: Self-pay | Admitting: Internal Medicine

## 2017-05-07 DIAGNOSIS — R0609 Other forms of dyspnea: Secondary | ICD-10-CM | POA: Diagnosis not present

## 2017-05-07 DIAGNOSIS — E7800 Pure hypercholesterolemia, unspecified: Secondary | ICD-10-CM | POA: Diagnosis not present

## 2017-05-07 DIAGNOSIS — M79671 Pain in right foot: Secondary | ICD-10-CM | POA: Diagnosis not present

## 2017-05-07 DIAGNOSIS — R42 Dizziness and giddiness: Secondary | ICD-10-CM | POA: Diagnosis not present

## 2017-05-07 DIAGNOSIS — M7671 Peroneal tendinitis, right leg: Secondary | ICD-10-CM | POA: Diagnosis not present

## 2017-05-07 DIAGNOSIS — I480 Paroxysmal atrial fibrillation: Secondary | ICD-10-CM | POA: Diagnosis not present

## 2017-05-07 DIAGNOSIS — E119 Type 2 diabetes mellitus without complications: Secondary | ICD-10-CM | POA: Diagnosis not present

## 2017-05-07 DIAGNOSIS — I2510 Atherosclerotic heart disease of native coronary artery without angina pectoris: Secondary | ICD-10-CM | POA: Diagnosis not present

## 2017-05-07 DIAGNOSIS — I10 Essential (primary) hypertension: Secondary | ICD-10-CM | POA: Diagnosis not present

## 2017-05-10 DIAGNOSIS — Z86010 Personal history of colonic polyps: Secondary | ICD-10-CM | POA: Diagnosis not present

## 2017-05-10 DIAGNOSIS — K862 Cyst of pancreas: Secondary | ICD-10-CM | POA: Diagnosis not present

## 2017-05-12 ENCOUNTER — Other Ambulatory Visit: Payer: Self-pay | Admitting: Internal Medicine

## 2017-05-16 DIAGNOSIS — I10 Essential (primary) hypertension: Secondary | ICD-10-CM | POA: Diagnosis not present

## 2017-05-16 DIAGNOSIS — I2510 Atherosclerotic heart disease of native coronary artery without angina pectoris: Secondary | ICD-10-CM | POA: Diagnosis not present

## 2017-05-16 DIAGNOSIS — R0609 Other forms of dyspnea: Secondary | ICD-10-CM | POA: Diagnosis not present

## 2017-05-16 DIAGNOSIS — R42 Dizziness and giddiness: Secondary | ICD-10-CM | POA: Diagnosis not present

## 2017-05-17 DIAGNOSIS — M25561 Pain in right knee: Secondary | ICD-10-CM | POA: Diagnosis not present

## 2017-05-17 DIAGNOSIS — M2391 Unspecified internal derangement of right knee: Secondary | ICD-10-CM | POA: Diagnosis not present

## 2017-05-21 DIAGNOSIS — I480 Paroxysmal atrial fibrillation: Secondary | ICD-10-CM | POA: Diagnosis not present

## 2017-05-21 DIAGNOSIS — E119 Type 2 diabetes mellitus without complications: Secondary | ICD-10-CM | POA: Diagnosis not present

## 2017-05-21 DIAGNOSIS — I10 Essential (primary) hypertension: Secondary | ICD-10-CM | POA: Diagnosis not present

## 2017-05-21 DIAGNOSIS — I2510 Atherosclerotic heart disease of native coronary artery without angina pectoris: Secondary | ICD-10-CM | POA: Diagnosis not present

## 2017-05-21 DIAGNOSIS — E785 Hyperlipidemia, unspecified: Secondary | ICD-10-CM | POA: Diagnosis not present

## 2017-05-22 DIAGNOSIS — M7671 Peroneal tendinitis, right leg: Secondary | ICD-10-CM | POA: Diagnosis not present

## 2017-05-22 DIAGNOSIS — L851 Acquired keratosis [keratoderma] palmaris et plantaris: Secondary | ICD-10-CM | POA: Diagnosis not present

## 2017-05-22 DIAGNOSIS — M79671 Pain in right foot: Secondary | ICD-10-CM | POA: Diagnosis not present

## 2017-05-22 DIAGNOSIS — E119 Type 2 diabetes mellitus without complications: Secondary | ICD-10-CM | POA: Diagnosis not present

## 2017-06-06 ENCOUNTER — Other Ambulatory Visit: Payer: Self-pay | Admitting: Gastroenterology

## 2017-06-06 ENCOUNTER — Encounter: Payer: Self-pay | Admitting: Internal Medicine

## 2017-06-06 DIAGNOSIS — K862 Cyst of pancreas: Secondary | ICD-10-CM

## 2017-06-06 DIAGNOSIS — H532 Diplopia: Secondary | ICD-10-CM

## 2017-06-07 NOTE — Telephone Encounter (Signed)
I had sent Mychart as patient didn't answer the phone earlier, patient called back and would like a referral to [facility] if you are okay with that office, he originally saw two different providers there, but they have both left recently.  He said if there is a different place you would prefer him go he will.  He will notify the neurologist of the symptoms. thanks

## 2017-06-07 NOTE — Telephone Encounter (Signed)
Please call pt and let him know that I would like for him to see opthalmologist asap.  I am going to place an order for referral - if he is agreeable - let me know and I will place the order.  I also want him to call and let his neurologist know if his problems.

## 2017-06-07 NOTE — Telephone Encounter (Signed)
Order placed for opthalmology referral 

## 2017-06-08 ENCOUNTER — Other Ambulatory Visit
Admission: RE | Admit: 2017-06-08 | Discharge: 2017-06-08 | Disposition: A | Discharge status: Home / Self Care | Payer: PPO | Source: Ambulatory Visit | Attending: Ophthalmology | Admitting: Ophthalmology

## 2017-06-08 DIAGNOSIS — H532 Diplopia: Secondary | ICD-10-CM | POA: Insufficient documentation

## 2017-06-08 LAB — CBC WITH DIFFERENTIAL/PLATELET
Basophils Absolute: 0 10*3/uL (ref 0–0.1)
Basophils Relative: 0 %
EOS ABS: 0.1 10*3/uL (ref 0–0.7)
EOS PCT: 2 %
HCT: 44.3 % (ref 40.0–52.0)
HEMOGLOBIN: 15 g/dL (ref 13.0–18.0)
LYMPHS PCT: 28 %
Lymphs Abs: 1.5 10*3/uL (ref 1.0–3.6)
MCH: 33.1 pg (ref 26.0–34.0)
MCHC: 34 g/dL (ref 32.0–36.0)
MCV: 97.4 fL (ref 80.0–100.0)
MONOS PCT: 11 %
Monocytes Absolute: 0.6 10*3/uL (ref 0.2–1.0)
NEUTROS PCT: 59 %
Neutro Abs: 3.1 10*3/uL (ref 1.4–6.5)
PLATELETS: 164 10*3/uL (ref 150–440)
RBC: 4.54 MIL/uL (ref 4.40–5.90)
RDW: 13.1 % (ref 11.5–14.5)
WBC: 5.3 10*3/uL (ref 3.8–10.6)

## 2017-06-08 LAB — SEDIMENTATION RATE: SED RATE: 7 mm/h (ref 0–20)

## 2017-06-08 LAB — C-REACTIVE PROTEIN: CRP: 1.4 mg/dL — ABNORMAL HIGH (ref <1.0)

## 2017-06-14 LAB — MISC LABCORP TEST (SEND OUT): LABCORP TEST CODE: 86120

## 2017-06-15 ENCOUNTER — Ambulatory Visit: Payer: PPO

## 2017-06-18 DIAGNOSIS — Z86010 Personal history of colonic polyps: Secondary | ICD-10-CM | POA: Diagnosis not present

## 2017-06-20 ENCOUNTER — Other Ambulatory Visit: Payer: Self-pay | Admitting: Gastroenterology

## 2017-06-20 DIAGNOSIS — K862 Cyst of pancreas: Secondary | ICD-10-CM

## 2017-06-21 ENCOUNTER — Ambulatory Visit
Admission: RE | Admit: 2017-06-21 | Discharge: 2017-06-21 | Disposition: A | Discharge status: Home / Self Care | Payer: PPO | Source: Ambulatory Visit | Attending: Gastroenterology | Admitting: Gastroenterology

## 2017-06-21 DIAGNOSIS — N281 Cyst of kidney, acquired: Secondary | ICD-10-CM | POA: Insufficient documentation

## 2017-06-21 DIAGNOSIS — Z9049 Acquired absence of other specified parts of digestive tract: Secondary | ICD-10-CM | POA: Insufficient documentation

## 2017-06-21 DIAGNOSIS — R932 Abnormal findings on diagnostic imaging of liver and biliary tract: Secondary | ICD-10-CM | POA: Diagnosis not present

## 2017-06-21 DIAGNOSIS — I700 Atherosclerosis of aorta: Secondary | ICD-10-CM | POA: Insufficient documentation

## 2017-06-21 DIAGNOSIS — K862 Cyst of pancreas: Secondary | ICD-10-CM | POA: Diagnosis not present

## 2017-06-21 LAB — POCT I-STAT CREATININE: Creatinine, Ser: 0.9 mg/dL (ref 0.61–1.24)

## 2017-06-21 MED ORDER — GADOBENATE DIMEGLUMINE 529 MG/ML IV SOLN
15.0000 mL | Freq: Once | INTRAVENOUS | Status: AC | PRN
  Administered 2017-06-21: 15 mL via INTRAVENOUS

## 2017-07-18 ENCOUNTER — Ambulatory Visit (INDEPENDENT_AMBULATORY_CARE_PROVIDER_SITE_OTHER): Payer: PPO

## 2017-07-18 DIAGNOSIS — Z23 Encounter for immunization: Secondary | ICD-10-CM

## 2017-08-11 ENCOUNTER — Other Ambulatory Visit: Payer: Self-pay | Admitting: Internal Medicine

## 2017-08-21 ENCOUNTER — Telehealth: Payer: Self-pay | Admitting: Internal Medicine

## 2017-08-21 ENCOUNTER — Other Ambulatory Visit: Payer: Self-pay | Admitting: Internal Medicine

## 2017-08-21 ENCOUNTER — Ambulatory Visit (INDEPENDENT_AMBULATORY_CARE_PROVIDER_SITE_OTHER): Payer: PPO

## 2017-08-21 VITALS — BP 138/68 | HR 59 | Temp 98.2°F | Resp 14 | Ht 68.0 in | Wt 178.1 lb

## 2017-08-21 DIAGNOSIS — Z0000 Encounter for general adult medical examination without abnormal findings: Secondary | ICD-10-CM

## 2017-08-21 DIAGNOSIS — I10 Essential (primary) hypertension: Secondary | ICD-10-CM

## 2017-08-21 DIAGNOSIS — E7800 Pure hypercholesterolemia, unspecified: Secondary | ICD-10-CM

## 2017-08-21 DIAGNOSIS — Z1331 Encounter for screening for depression: Secondary | ICD-10-CM

## 2017-08-21 DIAGNOSIS — E119 Type 2 diabetes mellitus without complications: Secondary | ICD-10-CM

## 2017-08-21 NOTE — Telephone Encounter (Signed)
Orders placed for labs.  Can schedule appt.

## 2017-08-21 NOTE — Patient Instructions (Addendum)
  Kyle Richardson , Thank you for taking time to come for your Medicare Wellness Visit. I appreciate your ongoing commitment to your health goals. Please review the following plan we discussed and let me know if I can assist you in the future.   Follow up with Dr. Nicki Reaper as needed.    Bring a copy of your [place] and/or Living Will to be scanned into chart.  Have a great day!  These are the goals we discussed:  Increase water intake.  Maintain walking regimen, low carb foods.   This is a list of the screening recommended for you and due dates:  Health Maintenance  Topic Date Due  . Pneumonia vaccines (2 of 2 - PPSV23) 01/05/2016  . Hemoglobin A1C  03/05/2017  . Urine Protein Check  05/05/2017  . Complete foot exam   05/07/2018  . Eye exam for diabetics  05/23/2018  . Tetanus Vaccine  06/28/2025  . Flu Shot  Completed

## 2017-08-21 NOTE — Telephone Encounter (Signed)
Please advise 

## 2017-08-21 NOTE — Telephone Encounter (Signed)
Pt would like to get fasting labs done before his next appt in Jan 2019. Order needed please and thank you!

## 2017-08-21 NOTE — Progress Notes (Signed)
Subjective:   Kyle Richardson is a 83 y.o. male who presents for Medicare Annual/Subsequent preventive examination.  Review of Systems:  No ROS.  Medicare Wellness Visit. Additional risk factors are reflected in the social history.  Cardiac Risk Factors include: advanced age (>66men, >61 women);hypertension;male gender;diabetes mellitus     Objective:    Vitals: BP 138/68 (BP Location: Left Arm, Patient Position: Sitting, Cuff Size: Normal)   Pulse (!) 59   Temp 98.2 F (36.8 C) (Oral)   Resp 14   Ht 5\' 8"  (1.727 m)   Wt 178 lb 1.9 oz (80.8 kg)   SpO2 95%   BMI 27.08 kg/m   Body mass index is 27.08 kg/m.  Tobacco Social History   Tobacco Use  Smoking Status Never Smoker  Smokeless Tobacco Never Used     Counseling given: Not Answered   Past Medical History:  Diagnosis Date  . 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  s/p PTCA of the mid RCA 1/96, also  s/p  CABG x 4 7/96  . Degenerative disc disease   . Diabetes mellitus ([place])   . Glaucoma   . Hypercholesterolemia   . Hypertension   . Migraine headache    history of  . Osteoarthritis   . Sleep apnea    Past Surgical History:  Procedure Laterality Date  . APPENDECTOMY    . CHOLECYSTECTOMY     Removed  . CORONARY ARTERY BYPASS GRAFT  7/96   x4  . HEMORRHOID SURGERY    . HERNIA REPAIR    . UPP and tonsillectomy  1/86   Family History  Problem Relation Age of Onset  . Heart disease Mother        died age 39  . Heart disease Father        and other vascular disease  . Diabetes Unknown        four siblings  . Heart disease Brother        s/p CABG  . Colon cancer Neg Hx   . Prostate cancer Neg Hx    Social History   Substance and Sexual Activity  Sexual Activity Not Currently    Outpatient Encounter Medications as of 08/21/2017  Medication Sig  . ACCU-CHEK AVIVA PLUS test strip CHECK BLOOD SUGAR ONCE DAILY.  Marland Kitchen ACCU-CHEK SOFTCLIX LANCETS lancets USE AS  DIRECTED  . apixaban (ELIQUIS) 5 MG TABS tablet Take by mouth.  Marland Kitchen atorvastatin (LIPITOR) 20 MG tablet TAKE 1 TABLET BY MOUTH EVERY DAY.  . calcium citrate-vitamin D (CITRACAL+D) 315-200 MG-UNIT per tablet Take 1 tablet by mouth daily.  . Chlorpheniramine Maleate (ALLERGY RELIEF PO) Take by mouth daily.  Marland Kitchen FLUoxetine (PROZAC) 10 MG capsule TAKE 1 CAPSULE BY MOUTH EVERY DAY.  . fluticasone (FLONASE) 50 MCG/ACT nasal spray SPRAY 2 PUFFS IN EACH NOSTRIL ONCE A DAY  . latanoprost (XALATAN) 0.005 % ophthalmic solution Place 1 drop into both eyes at bedtime.  . metoprolol succinate (TOPROL-XL) 50 MG 24 hr tablet Take 50 mg by mouth daily. Take with or immediately following a meal.  . Multiple Vitamin (MULTIVITAMIN) tablet Take 1 tablet by mouth daily.  . Multiple Vitamins-Minerals (PRESERVISION AREDS 2) CAPS Take by mouth daily.  . mupirocin ointment (BACTROBAN) 2 % Apply to affected areas bid  . nitroGLYCERIN (NITROSTAT) 0.4 MG SL tablet Place 0.4 mg under the tongue every 5 (five) minutes as needed. As directed  . pantoprazole (PROTONIX) 40 MG tablet Take  40 mg by mouth daily.  . chlorpheniramine-HYDROcodone (TUSSIONEX PENNKINETIC ER) 10-8 MG/5ML SUER Take 5 mLs by mouth every 12 (twelve) hours as needed. (Patient not taking: Reported on 08/21/2017)   No facility-administered encounter medications on file as of 08/21/2017.     Activities of Daily Living In your present state of health, do you have any difficulty performing the following activities: 08/21/2017 08/21/2016  Hearing? Y Y  Comment - Wears hearing aids, bilateral  Vision? N N  Difficulty concentrating or making decisions? Y N  Comment Memory delay with names -  Walking or climbing stairs? Y N  Comment Unsteady gait -  Dressing or bathing? N N  Doing errands, shopping? N N  Preparing Food and eating ? N N  Using the Toilet? N N  In the past six months, have you accidently leaked urine? N Y  Comment - Managed with medication  Do  you have problems with loss of bowel control? N N  Managing your Medications? N N  Managing your Finances? N N  Housekeeping or managing your Housekeeping? N N  Some recent data might be hidden    Patient Care Team: Einar Pheasant, MD as PCP - General (Internal Medicine)   Assessment:    This is a routine wellness examination for Kyle Richardson. The goal of the wellness visit is to assist the patient how to close the gaps in care and create a preventative care plan for the patient.   The roster of all physicians providing medical care to patient is listed in the Snapshot section of the chart.  Taking calcium VIT D as appropriate/Osteoporosis risk reviewed.    Safety issues reviewed; Smoke and carbon monoxide detectors in the home. No firearms in the home.  Wears seatbelts when driving or riding with others. Patient does wear sunscreen or protective clothing when in direct sunlight. No violence in the home.  Depression- PHQ 2 &9 complete.  No signs/symptoms or verbal communication regarding little pleasure in doing things, feeling down, depressed or hopeless. No changes in sleeping, energy, eating, concentrating.  No thoughts of self harm or harm towards others.  Time spent on this topic is 10 minutes.   Patient is alert, normal appearance, oriented to person/place/and time. Correctly identified the president of the [place], recall of 3/3 words, and performing simple calculations. Displays appropriate judgement and can read correct time from watch face.   No new identified risk were noted.  No failures at ADL's or IADL's.    BMI- discussed the importance of a healthy diet, water intake and the benefits of aerobic exercise. Educational material provided.   24 hour diet recall: Low carb diet  Daily fluid intake: 1 cups of caffeine, 2 cups of water  Dental- every 6 months.  Dr.   [place]- Visual acuity not assessed per patient preference since they have regular follow up with the  ophthalmologist.  Wears corrective lenses.  Sleep patterns- Sleeps 6-8 hours at night.  Wakes feeling rested.  Naps during the day as needed.  Pneumovax 23 vaccine declined.  Educational material provided.  Diabetic follow up scheduled with PCP.  Exercise Activities and Dietary recommendations Current Exercise Habits: Home exercise routine, Type of exercise: walking, Time (Minutes): 20, Frequency (Times/Week): 4, Weekly Exercise (Minutes/Week): 80, Intensity: Mild  Fall Risk Fall Risk  08/21/2017 08/21/2016 08/20/2015 12/17/2014 02/03/2013  Falls in the past year? No No No No No   Depression Screen PHQ 2/9 Scores 08/21/2017 08/21/2016 08/20/2015 12/17/2014  PHQ - 2 Score  0 0 0 0  PHQ- 9 Score 0 - - -    Cognitive Function MMSE - Mini Mental State Exam 08/21/2016 08/20/2015  Orientation to time 5 5  Orientation to Place 5 5  Registration 3 3  Attention/ Calculation 5 5  Recall 3 3  Language- name 2 objects 2 2  Language- repeat 1 1  Language- follow 3 step command 3 3  Language- read & follow direction 1 1  Write a sentence 1 1  Copy design 1 1  Total score 30 30     6CIT Screen 08/21/2017 08/21/2016  What Year? 0 points 0 points  What month? 0 points 0 points  What time? 0 points 0 points  Count back from 20 0 points 0 points  Months in reverse 0 points 0 points  Repeat phrase 0 points -  Total Score 0 -    Immunization History  Administered Date(s) Administered  . Influenza Split 08/19/2009  . Influenza, High Dose Seasonal PF 06/19/2016, 07/18/2017  . Influenza,inj,Quad PF,6+ Mos 07/03/2013, 07/03/2014, 06/03/2015  . Pneumococcal Conjugate-13 08/13/1996, 07/09/2001, 07/09/2006, 01/05/2015  . Pneumococcal Polysaccharide-23 07/09/2006  . Tdap 06/29/2015   Screening Tests Health Maintenance  Topic Date Due  . PNA vac Low Risk Adult (2 of 2 - PPSV23) 01/05/2016  . HEMOGLOBIN A1C  03/05/2017  . URINE MICROALBUMIN  05/05/2017  . FOOT EXAM  05/07/2018  .  OPHTHALMOLOGY EXAM  05/23/2018  . TETANUS/TDAP  06/28/2025  . INFLUENZA VACCINE  Completed      Plan:    End of life planning; Advance aging; Advanced directives discussed. Copy of current HCPOA/Living Will requested.    I have personally reviewed and noted the following in the patient's chart:   . Medical and social history . Use of alcohol, tobacco or illicit drugs  . Current medications and supplements . Functional ability and status . Nutritional status . Physical activity . Advanced directives . List of other physicians . Hospitalizations, surgeries, and ER visits in previous 12 months . Vitals . Screenings to include cognitive, depression, and falls . Referrals and appointments  In addition, I have reviewed and discussed with patient certain preventive protocols, quality metrics, and best practice recommendations. A written personalized care plan for preventive services as well as general preventive health recommendations were provided to patient.     Varney Biles, LPN  21/19/4174   Reviewed above information.  Agree with assessment and plan.   Dr Nicki Reaper

## 2017-08-21 NOTE — Progress Notes (Signed)
Orders placed for labs

## 2017-08-22 NOTE — Telephone Encounter (Signed)
Called patient app has been made.

## 2017-08-27 ENCOUNTER — Encounter: Payer: Self-pay | Admitting: Internal Medicine

## 2017-08-28 DIAGNOSIS — H401131 Primary open-angle glaucoma, bilateral, mild stage: Secondary | ICD-10-CM | POA: Diagnosis not present

## 2017-08-31 ENCOUNTER — Other Ambulatory Visit: Payer: Self-pay | Admitting: Internal Medicine

## 2017-09-04 DIAGNOSIS — H401131 Primary open-angle glaucoma, bilateral, mild stage: Secondary | ICD-10-CM | POA: Diagnosis not present

## 2017-09-11 ENCOUNTER — Other Ambulatory Visit: Payer: Self-pay | Admitting: Internal Medicine

## 2017-09-19 ENCOUNTER — Encounter: Payer: Self-pay | Admitting: Internal Medicine

## 2017-09-19 DIAGNOSIS — G4733 Obstructive sleep apnea (adult) (pediatric): Secondary | ICD-10-CM | POA: Diagnosis not present

## 2017-09-19 DIAGNOSIS — R42 Dizziness and giddiness: Secondary | ICD-10-CM

## 2017-09-19 DIAGNOSIS — E785 Hyperlipidemia, unspecified: Secondary | ICD-10-CM | POA: Diagnosis not present

## 2017-09-19 DIAGNOSIS — E119 Type 2 diabetes mellitus without complications: Secondary | ICD-10-CM | POA: Diagnosis not present

## 2017-09-19 DIAGNOSIS — I639 Cerebral infarction, unspecified: Secondary | ICD-10-CM | POA: Diagnosis not present

## 2017-09-19 DIAGNOSIS — I480 Paroxysmal atrial fibrillation: Secondary | ICD-10-CM | POA: Diagnosis not present

## 2017-09-19 DIAGNOSIS — I10 Essential (primary) hypertension: Secondary | ICD-10-CM | POA: Diagnosis not present

## 2017-09-19 DIAGNOSIS — E7800 Pure hypercholesterolemia, unspecified: Secondary | ICD-10-CM | POA: Diagnosis not present

## 2017-09-19 DIAGNOSIS — I2510 Atherosclerotic heart disease of native coronary artery without angina pectoris: Secondary | ICD-10-CM | POA: Diagnosis not present

## 2017-09-19 DIAGNOSIS — H539 Unspecified visual disturbance: Secondary | ICD-10-CM

## 2017-09-20 ENCOUNTER — Encounter: Payer: Self-pay | Admitting: Internal Medicine

## 2017-09-20 DIAGNOSIS — R9389 Abnormal findings on diagnostic imaging of other specified body structures: Secondary | ICD-10-CM | POA: Insufficient documentation

## 2017-09-20 NOTE — Telephone Encounter (Signed)
I have placed the order for the referral.  I would also recommend him calling Dr Chilikuri's office as well and letting them know of his ongoing issues.  If any acute symptoms, to ER for evaluation.  See me if questions.  Also, if having intermittent light headedness, I recommend f/u with cardiology (Dr Saralyn Pilar).  If he needs [place] to make appt, let me know.

## 2017-09-20 NOTE — Telephone Encounter (Signed)
Patient was evaluated by Dr. Idelle Leech on 09/19/17 and stated he would give Dr. Verneita Griffes office a cal and make them aware of the on going issue's. No new acute symptoms per patient.

## 2017-09-25 ENCOUNTER — Encounter: Payer: Self-pay | Admitting: Internal Medicine

## 2017-09-25 DIAGNOSIS — I6930 Unspecified sequelae of cerebral infarction: Secondary | ICD-10-CM | POA: Diagnosis not present

## 2017-09-25 DIAGNOSIS — H532 Diplopia: Secondary | ICD-10-CM | POA: Diagnosis not present

## 2017-09-26 DIAGNOSIS — R0602 Shortness of breath: Secondary | ICD-10-CM | POA: Diagnosis not present

## 2017-09-26 DIAGNOSIS — R918 Other nonspecific abnormal finding of lung field: Secondary | ICD-10-CM | POA: Diagnosis not present

## 2017-09-26 DIAGNOSIS — R0609 Other forms of dyspnea: Secondary | ICD-10-CM | POA: Diagnosis not present

## 2017-09-26 DIAGNOSIS — G4733 Obstructive sleep apnea (adult) (pediatric): Secondary | ICD-10-CM | POA: Diagnosis not present

## 2017-10-12 ENCOUNTER — Other Ambulatory Visit (INDEPENDENT_AMBULATORY_CARE_PROVIDER_SITE_OTHER): Payer: PPO

## 2017-10-12 DIAGNOSIS — I10 Essential (primary) hypertension: Secondary | ICD-10-CM | POA: Diagnosis not present

## 2017-10-12 DIAGNOSIS — R042 Hemoptysis: Secondary | ICD-10-CM

## 2017-10-12 DIAGNOSIS — E119 Type 2 diabetes mellitus without complications: Secondary | ICD-10-CM

## 2017-10-12 DIAGNOSIS — E7800 Pure hypercholesterolemia, unspecified: Secondary | ICD-10-CM

## 2017-10-12 LAB — BASIC METABOLIC PANEL
BUN: 16 mg/dL (ref 6–23)
CHLORIDE: 102 meq/L (ref 96–112)
CO2: 29 meq/L (ref 19–32)
CREATININE: 0.95 mg/dL (ref 0.40–1.50)
Calcium: 9 mg/dL (ref 8.4–10.5)
GFR: 79.54 mL/min (ref >60.00)
Glucose, Bld: 149 mg/dL — ABNORMAL HIGH (ref 70–99)
Potassium: 4.2 mEq/L (ref 3.5–5.1)
Sodium: 138 mEq/L (ref 135–145)

## 2017-10-12 LAB — MICROALBUMIN / CREATININE URINE RATIO
CREATININE, U: 96.5 mg/dL
Microalb Creat Ratio: 2.3 mg/g (ref 0.0–30.0)
Microalb, Ur: 2.2 mg/dL — ABNORMAL HIGH (ref 0.0–1.9)

## 2017-10-12 LAB — HEPATIC FUNCTION PANEL
ALK PHOS: 57 U/L (ref 39–117)
ALT: 23 U/L (ref 0–53)
AST: 22 U/L (ref 0–37)
Albumin: 3.7 g/dL (ref 3.5–5.2)
Bilirubin, Direct: 0.1 mg/dL (ref 0.0–0.3)
TOTAL PROTEIN: 6.2 g/dL (ref 6.0–8.3)
Total Bilirubin: 0.9 mg/dL (ref 0.2–1.2)

## 2017-10-12 LAB — TSH: TSH: 3.21 u[IU]/mL (ref 0.35–4.50)

## 2017-10-12 LAB — CREATININE, SERUM: Creatinine, Ser: 0.98 mg/dL (ref 0.40–1.50)

## 2017-10-12 LAB — LIPID PANEL
Cholesterol: 167 mg/dL (ref 0–200)
HDL: 37.2 mg/dL — ABNORMAL LOW (ref >39.00)
NONHDL: 129.84
Total CHOL/HDL Ratio: 4
Triglycerides: 275 mg/dL — ABNORMAL HIGH (ref 0.0–149.0)
VLDL: 55 mg/dL — ABNORMAL HIGH (ref 0.0–40.0)

## 2017-10-12 LAB — BUN: BUN: 16 mg/dL (ref 6–23)

## 2017-10-12 LAB — HEMOGLOBIN A1C: HEMOGLOBIN A1C: 7.3 % — AB (ref 4.6–6.5)

## 2017-10-12 LAB — LDL CHOLESTEROL, DIRECT: LDL DIRECT: 81 mg/dL

## 2017-10-12 NOTE — Addendum Note (Signed)
Addended by: Leeanne Rio on: 10/12/2017 09:28 AM   Modules accepted: Orders

## 2017-10-14 ENCOUNTER — Encounter: Payer: Self-pay | Admitting: Internal Medicine

## 2017-10-16 ENCOUNTER — Encounter: Payer: Self-pay | Admitting: Internal Medicine

## 2017-10-16 ENCOUNTER — Ambulatory Visit (INDEPENDENT_AMBULATORY_CARE_PROVIDER_SITE_OTHER): Payer: PPO | Admitting: Internal Medicine

## 2017-10-16 DIAGNOSIS — F439 Reaction to severe stress, unspecified: Secondary | ICD-10-CM | POA: Diagnosis not present

## 2017-10-16 DIAGNOSIS — I2510 Atherosclerotic heart disease of native coronary artery without angina pectoris: Secondary | ICD-10-CM

## 2017-10-16 DIAGNOSIS — I480 Paroxysmal atrial fibrillation: Secondary | ICD-10-CM

## 2017-10-16 DIAGNOSIS — H532 Diplopia: Secondary | ICD-10-CM

## 2017-10-16 DIAGNOSIS — R9389 Abnormal findings on diagnostic imaging of other specified body structures: Secondary | ICD-10-CM | POA: Diagnosis not present

## 2017-10-16 DIAGNOSIS — I712 Thoracic aortic aneurysm, without rupture: Secondary | ICD-10-CM | POA: Diagnosis not present

## 2017-10-16 DIAGNOSIS — E7800 Pure hypercholesterolemia, unspecified: Secondary | ICD-10-CM

## 2017-10-16 DIAGNOSIS — I10 Essential (primary) hypertension: Secondary | ICD-10-CM

## 2017-10-16 DIAGNOSIS — I7121 Aneurysm of the ascending aorta, without rupture: Secondary | ICD-10-CM

## 2017-10-16 DIAGNOSIS — E119 Type 2 diabetes mellitus without complications: Secondary | ICD-10-CM | POA: Diagnosis not present

## 2017-10-16 NOTE — Progress Notes (Signed)
Pre visit review using our clinic review tool, if applicable. No additional management support is needed unless otherwise documented below in the visit note. 

## 2017-10-16 NOTE — Progress Notes (Signed)
Patient ID: Kyle Richardson, male   DOB: 08-02-1928, 84 y.o.   MRN: [id number]   Subjective:    Patient ID: Kyle Richardson, male    DOB: [birth year], 84 y.o.   MRN: [id number]  HPI  Patient here for a scheduled follow up.  Her reports he is doing relatively well.  Has afib.  On eliquis.  Heart stable.  Has intermittent episodes of diplopia - vertical and horizontal.  Lasts a few minutes and resolves.  Saw ophthalmology.  No ocular problem identified.  MRI negative for stroke and MRA showed no significant stenosis.  She recommended continue eliquis.  Also referred him to Dr Ernst Bowler for further evaluation.  No chest pain.  Breathing stable.  No acid reflux.  No abdominal pain.  Bowels moving.  Discussed recent labs.  a1c 7.3.  Discussed elevated blood sugars.  Discussed diet and exercise.  Hold on additional medication.  Handling stress well.  Overall feels he is doing relatively well.     Past Medical History:  Diagnosis Date  . 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  s/p PTCA of the mid RCA 1/96, also  s/p  CABG x 4 7/96  . Degenerative disc disease   . Diabetes mellitus ([place])   . Glaucoma   . Hypercholesterolemia   . Hypertension   . Migraine headache    history of  . Osteoarthritis   . Sleep apnea    Past Surgical History:  Procedure Laterality Date  . APPENDECTOMY    . CHOLECYSTECTOMY     Removed  . CORONARY ARTERY BYPASS GRAFT  7/96   x4  . HEMORRHOID SURGERY    . HERNIA REPAIR    . UPP and tonsillectomy  1/86   Family History  Problem Relation Age of Onset  . Heart disease Mother        died age 66  . Heart disease Father        and other vascular disease  . Diabetes Unknown        four siblings  . Heart disease Brother        s/p CABG  . Colon cancer Neg Hx   . Prostate cancer Neg Hx    Social History   Socioeconomic History  . Marital status: Married    Spouse name: None  . Number of children: 2  . Years of education: None  .  Highest education level: None  Social Needs  . Financial resource strain: Not hard at all  . Food insecurity - worry: Never true  . Food insecurity - inability: Never true  . Transportation needs - medical: No  . Transportation needs - non-medical: No  Occupational History  . None  Tobacco Use  . Smoking status: Never Smoker  . Smokeless tobacco: Never Used  Substance and Sexual Activity  . Alcohol use: No    Alcohol/week: 0.0 oz  . Drug use: No  . Sexual activity: Not Currently  Other Topics Concern  . None  Social History Narrative  . None    Outpatient Encounter Medications as of 10/16/2017  Medication Sig  . ACCU-CHEK AVIVA PLUS test strip CHECK BLOOD SUGAR ONCE DAILY.  Marland Kitchen ACCU-CHEK SOFTCLIX LANCETS lancets USE AS DIRECTED  . apixaban (ELIQUIS) 5 MG TABS tablet Take by mouth.  Marland Kitchen atorvastatin (LIPITOR) 20 MG tablet TAKE 1 TABLET BY MOUTH EVERY DAY  . calcium citrate-vitamin D (CITRACAL+D) 315-200 MG-UNIT per tablet Take 1 tablet by mouth  daily.  . Chlorpheniramine Maleate (ALLERGY RELIEF PO) Take by mouth daily.  . chlorpheniramine-HYDROcodone (TUSSIONEX PENNKINETIC ER) 10-8 MG/5ML SUER Take 5 mLs by mouth every 12 (twelve) hours as needed.  Marland Kitchen FLUoxetine (PROZAC) 10 MG capsule TAKE 1 CAPSULE BY MOUTH EVERY DAY  . fluticasone (FLONASE) 50 MCG/ACT nasal spray SPRAY 2 PUFFS IN EACH NOSTRIL ONCE A DAY  . latanoprost (XALATAN) 0.005 % ophthalmic solution Place 1 drop into both eyes at bedtime.  . metoprolol succinate (TOPROL-XL) 50 MG 24 hr tablet Take 50 mg by mouth daily. Take with or immediately following a meal.  . Multiple Vitamin (MULTIVITAMIN) tablet Take 1 tablet by mouth daily.  . Multiple Vitamins-Minerals (PRESERVISION AREDS 2) CAPS Take by mouth daily.  . mupirocin ointment (BACTROBAN) 2 % Apply to affected areas bid  . nitroGLYCERIN (NITROSTAT) 0.4 MG SL tablet Place 0.4 mg under the tongue every 5 (five) minutes as needed. As directed  . pantoprazole (PROTONIX) 40 MG  tablet Take 40 mg by mouth daily.   No facility-administered encounter medications on file as of 10/16/2017.     Review of Systems  Constitutional: Negative for appetite change and unexpected weight change.  HENT: Negative for congestion and sinus pressure.   Eyes:       Diplopia as outlined.   Respiratory: Negative for cough, chest tightness and shortness of breath.   Cardiovascular: Negative for chest pain, palpitations and leg swelling.  Gastrointestinal: Negative for abdominal pain, diarrhea, nausea and vomiting.  Genitourinary: Negative for difficulty urinating and dysuria.  Musculoskeletal: Negative for back pain and joint swelling.  Skin: Negative for color change and rash.  Neurological: Negative for dizziness, light-headedness and headaches.  Psychiatric/Behavioral: Negative for agitation and dysphoric mood.       Objective:     Blood pressure rechecked by me:  146/64-68  Physical Exam  Constitutional: He appears well-developed and well-nourished. No distress.  HENT:  Nose: Nose normal.  Mouth/Throat: Oropharynx is clear and moist.  Neck: Neck supple. No thyromegaly present.  Cardiovascular: Normal rate and regular rhythm.  Pulmonary/Chest: Effort normal and breath sounds normal. No respiratory distress.  Abdominal: Soft. Bowel sounds are normal. There is no tenderness.  Musculoskeletal: He exhibits no edema or tenderness.  Lymphadenopathy:    He has no cervical adenopathy.  Skin: No rash noted. No erythema.  Psychiatric: He has a normal mood and affect. His behavior is normal.    BP (!) 146/68   Pulse 60   Temp 98.3 F (36.8 C) (Oral)   Ht 5\' 8"  (1.727 m)   Wt 180 lb 3.2 oz (81.7 kg)   SpO2 95%   BMI 27.40 kg/m  Wt Readings from Last 3 Encounters:  10/16/17 180 lb 3.2 oz (81.7 kg)  08/21/17 178 lb 1.9 oz (80.8 kg)  01/18/17 178 lb 12.8 oz (81.1 kg)     Lab Results  Component Value Date   WBC 5.3 06/08/2017   HGB 15.0 06/08/2017   HCT 44.3  06/08/2017   PLT 164 06/08/2017   GLUCOSE 149 (H) 10/12/2017   CHOL 167 10/12/2017   TRIG 275.0 (H) 10/12/2017   HDL 37.20 (L) 10/12/2017   LDLDIRECT 81.0 10/12/2017   LDLCALC 95 09/05/2016   ALT 23 10/12/2017   AST 22 10/12/2017   NA 138 10/12/2017   K 4.2 10/12/2017   CL 102 10/12/2017   CREATININE 0.98 10/12/2017   BUN 16 10/12/2017   CO2 29 10/12/2017   TSH 3.21 10/12/2017   PSA 0.66  05/05/2016   INR 1.4 02/17/2014   HGBA1C 7.3 (H) 10/12/2017   MICROALBUR 2.2 (H) 10/12/2017    Mr 3d Recon At Scanner  Result Date: 06/21/2017 CLINICAL DATA:  Follow-up cystic pancreatic lesion. No acute symptoms reported. EXAM: MRI ABDOMEN WITHOUT AND WITH CONTRAST (INCLUDING MRCP) TECHNIQUE: Multiplanar multisequence MR imaging of the abdomen was performed both before and after the administration of intravenous contrast. Heavily T2-weighted images of the biliary and pancreatic ducts were obtained, and three-dimensional MRCP images were rendered by post processing. CONTRAST:  50mL MULTIHANCE GADOBENATE DIMEGLUMINE 529 MG/ML IV SOLN COMPARISON:  04/22/2014 MRI abdomen. FINDINGS: Lower chest: No gross abnormality at the lung bases. Sternotomy wires are seen in the lower chest. Hepatobiliary: Normal liver size and configuration. No hepatic steatosis. No liver mass. Cholecystectomy. No biliary ductal dilatation. Common bile duct diameter 4 mm. No choledocholithiasis. Pancreas: There is a 1.3 x 0.9 x 1.4 cm cystic pancreatic lesion in the uncinate process (series 6/ image 28), which demonstrates a lobulated outer contour, a few thin internal septations with perceived septal enhancement and no wall thickening or solid enhancement, previously 1.3 x 0.9 x 1.4 cm on 04/22/2014 MRI using similar measurement technique, stable in size and appearance. No additional pancreatic lesions. No pancreatic duct dilation. No pancreas divisum. Spleen: Normal size. No mass. Adrenals/Urinary Tract: Normal adrenals. No  hydronephrosis. There is a 1.1 cm T1 and T2 hyperintense renal cortical lesion in the lateral upper right kidney (series 7/ image 47) without appreciable enhancement on the subtraction images, stable in size, compatible with a Bosniak category 2 hemorrhagic/proteinaceous renal cyst. Additional tiny simple cortical and parapelvic renal cysts in both kidneys. No suspicious renal masses. Stomach/Bowel: Grossly normal stomach. Visualized small and large bowel is normal caliber, with no bowel wall thickening. Vascular/Lymphatic: Atherosclerotic nonaneurysmal abdominal aorta. Patent portal, splenic, hepatic and renal veins. No pathologically enlarged lymph nodes in the abdomen. Other: No abdominal ascites or focal fluid collection. Musculoskeletal: No aggressive appearing focal osseous lesions. IMPRESSION: 1. Interval stability of 1.4 cm uncinate process cystic pancreatic lesion without high risk MRI features, for which 3 year stability has been demonstrated. A final follow-up MRI abdomen without and with IV contrast is recommended in 2 years. This recommendation follows ACR consensus guidelines: Management of Incidental Pancreatic Cysts: A White Paper of the ACR Incidental Findings Committee. J Am Coll Radiol 2017;14:911-923. 2. Bosniak category 1 and category 2 renal cysts. No suspicious renal masses. 3. Bile ducts are within normal post cholecystectomy limits. 4.  Aortic Atherosclerosis (ICD10-I70.0). Electronically Signed   By: Ilona Sorrel M.D.   On: 06/21/2017 11:55   Mr Abdomen Mrcp Moise Boring Contast  Result Date: 06/21/2017 CLINICAL DATA:  Follow-up cystic pancreatic lesion. No acute symptoms reported. EXAM: MRI ABDOMEN WITHOUT AND WITH CONTRAST (INCLUDING MRCP) TECHNIQUE: Multiplanar multisequence MR imaging of the abdomen was performed both before and after the administration of intravenous contrast. Heavily T2-weighted images of the biliary and pancreatic ducts were obtained, and three-dimensional MRCP images  were rendered by post processing. CONTRAST:  54mL MULTIHANCE GADOBENATE DIMEGLUMINE 529 MG/ML IV SOLN COMPARISON:  04/22/2014 MRI abdomen. FINDINGS: Lower chest: No gross abnormality at the lung bases. Sternotomy wires are seen in the lower chest. Hepatobiliary: Normal liver size and configuration. No hepatic steatosis. No liver mass. Cholecystectomy. No biliary ductal dilatation. Common bile duct diameter 4 mm. No choledocholithiasis. Pancreas: There is a 1.3 x 0.9 x 1.4 cm cystic pancreatic lesion in the uncinate process (series 6/ image 28), which demonstrates a  lobulated outer contour, a few thin internal septations with perceived septal enhancement and no wall thickening or solid enhancement, previously 1.3 x 0.9 x 1.4 cm on 04/22/2014 MRI using similar measurement technique, stable in size and appearance. No additional pancreatic lesions. No pancreatic duct dilation. No pancreas divisum. Spleen: Normal size. No mass. Adrenals/Urinary Tract: Normal adrenals. No hydronephrosis. There is a 1.1 cm T1 and T2 hyperintense renal cortical lesion in the lateral upper right kidney (series 7/ image 47) without appreciable enhancement on the subtraction images, stable in size, compatible with a Bosniak category 2 hemorrhagic/proteinaceous renal cyst. Additional tiny simple cortical and parapelvic renal cysts in both kidneys. No suspicious renal masses. Stomach/Bowel: Grossly normal stomach. Visualized small and large bowel is normal caliber, with no bowel wall thickening. Vascular/Lymphatic: Atherosclerotic nonaneurysmal abdominal aorta. Patent portal, splenic, hepatic and renal veins. No pathologically enlarged lymph nodes in the abdomen. Other: No abdominal ascites or focal fluid collection. Musculoskeletal: No aggressive appearing focal osseous lesions. IMPRESSION: 1. Interval stability of 1.4 cm uncinate process cystic pancreatic lesion without high risk MRI features, for which 3 year stability has been demonstrated.  A final follow-up MRI abdomen without and with IV contrast is recommended in 2 years. This recommendation follows ACR consensus guidelines: Management of Incidental Pancreatic Cysts: A White Paper of the ACR Incidental Findings Committee. J Am Coll Radiol 2017;14:911-923. 2. Bosniak category 1 and category 2 renal cysts. No suspicious renal masses. 3. Bile ducts are within normal post cholecystectomy limits. 4.  Aortic Atherosclerosis (ICD10-I70.0). Electronically Signed   By: Ilona Sorrel M.D.   On: 06/21/2017 11:55       Assessment & Plan:   Problem List Items Addressed This Visit    A-fib ([place])    In SR.  Followed by cardiology.  On eliquis.        Abnormal chest CT    Followed by Dr Raul Del.  Evaluated recently - 09/26/17. Recommended f/u cxr in 6 months.        Ascending aortic aneurysm ([place])    Evaluated by Dr Lucky Cowboy.  Recommended f/u CT in one year.        CAD (coronary artery disease)    Followed by cardiology.  Stable.  Continue risk factor modification.       Diabetes mellitus ([facility])    a1c 7.3.  Discussed diet and exercise.  Follow.        Double vision    Episodes of diplopia as outlined.  On eliquis.  MRI/MRA as outlined.  Saw neurology.  Being referred to a neuromuscular specialist.        Hypercholesterolemia    On lipitor.  Low cholesterol diet and exercise.  Follow lipid panel and liver function tests.        Hypertension    Blood pressure as outlined.  On recheck improved slightly.  Have him spot check his pressures.  If persistent elevation, will require additional medication.        Stress    Doing well on prozac.           Einar Pheasant, MD

## 2017-10-20 ENCOUNTER — Encounter: Payer: Self-pay | Admitting: Internal Medicine

## 2017-10-20 NOTE — Assessment & Plan Note (Signed)
Followed by Dr Raul Del.  Evaluated recently - 09/26/17. Recommended f/u cxr in 6 months.

## 2017-10-20 NOTE — Assessment & Plan Note (Signed)
Followed by cardiology.  Stable.  Continue risk factor modification.   

## 2017-10-20 NOTE — Assessment & Plan Note (Signed)
a1c 7.3.  Discussed diet and exercise.  Follow.

## 2017-10-20 NOTE — Assessment & Plan Note (Signed)
In SR.  Followed by cardiology.  On eliquis.   

## 2017-10-20 NOTE — Assessment & Plan Note (Signed)
Blood pressure as outlined.  On recheck improved slightly.  Have him spot check his pressures.  If persistent elevation, will require additional medication.

## 2017-10-20 NOTE — Assessment & Plan Note (Signed)
Episodes of diplopia as outlined.  On eliquis.  MRI/MRA as outlined.  Saw neurology.  Being referred to a neuromuscular specialist.

## 2017-10-20 NOTE — Assessment & Plan Note (Signed)
Evaluated by Dr Lucky Cowboy.  Recommended f/u CT in one year.

## 2017-10-20 NOTE — Assessment & Plan Note (Signed)
On lipitor.  Low cholesterol diet and exercise.  Follow lipid panel and liver function tests.   

## 2017-10-20 NOTE — Assessment & Plan Note (Signed)
Doing well on prozac.   

## 2017-11-14 ENCOUNTER — Encounter: Payer: Self-pay | Admitting: Internal Medicine

## 2017-11-20 DIAGNOSIS — H532 Diplopia: Secondary | ICD-10-CM | POA: Diagnosis not present

## 2017-12-03 ENCOUNTER — Other Ambulatory Visit: Payer: Self-pay | Admitting: Internal Medicine

## 2017-12-10 DIAGNOSIS — D2371 Other benign neoplasm of skin of right lower limb, including hip: Secondary | ICD-10-CM | POA: Diagnosis not present

## 2017-12-10 DIAGNOSIS — E119 Type 2 diabetes mellitus without complications: Secondary | ICD-10-CM | POA: Diagnosis not present

## 2017-12-10 DIAGNOSIS — D2372 Other benign neoplasm of skin of left lower limb, including hip: Secondary | ICD-10-CM | POA: Diagnosis not present

## 2017-12-10 DIAGNOSIS — B351 Tinea unguium: Secondary | ICD-10-CM | POA: Diagnosis not present

## 2017-12-21 ENCOUNTER — Ambulatory Visit (INDEPENDENT_AMBULATORY_CARE_PROVIDER_SITE_OTHER): Payer: PPO | Admitting: Internal Medicine

## 2017-12-21 ENCOUNTER — Encounter: Payer: Self-pay | Admitting: Internal Medicine

## 2017-12-21 DIAGNOSIS — E118 Type 2 diabetes mellitus with unspecified complications: Secondary | ICD-10-CM | POA: Diagnosis not present

## 2017-12-21 DIAGNOSIS — R9389 Abnormal findings on diagnostic imaging of other specified body structures: Secondary | ICD-10-CM

## 2017-12-21 DIAGNOSIS — I2510 Atherosclerotic heart disease of native coronary artery without angina pectoris: Secondary | ICD-10-CM | POA: Diagnosis not present

## 2017-12-21 DIAGNOSIS — E119 Type 2 diabetes mellitus without complications: Secondary | ICD-10-CM

## 2017-12-21 DIAGNOSIS — I480 Paroxysmal atrial fibrillation: Secondary | ICD-10-CM | POA: Diagnosis not present

## 2017-12-21 DIAGNOSIS — I7121 Aneurysm of the ascending aorta, without rupture: Secondary | ICD-10-CM

## 2017-12-21 DIAGNOSIS — I10 Essential (primary) hypertension: Secondary | ICD-10-CM

## 2017-12-21 DIAGNOSIS — I712 Thoracic aortic aneurysm, without rupture: Secondary | ICD-10-CM | POA: Diagnosis not present

## 2017-12-21 DIAGNOSIS — E7800 Pure hypercholesterolemia, unspecified: Secondary | ICD-10-CM | POA: Diagnosis not present

## 2017-12-21 DIAGNOSIS — H532 Diplopia: Secondary | ICD-10-CM

## 2017-12-21 NOTE — Progress Notes (Signed)
Patient ID: Dale Ribeiro, male   DOB: 12/20/1927, 84 y.o.   MRN: [id number]   Subjective:    Patient ID: [name], male    DOB: 06/30/1928, 84 y.o.   MRN: [id number]  HPI  Patient here for a scheduled follow up.  He reports he is doing relatively well.  Tries to stay active.  Has not been able to get out as much recently.  No chest pain.  No sob.  No acid reflux.  No abdominal pain.  Bowels moving.  No urine change.  His episodes with his vision, better.  Saw neurology.  Reviewed note.  Referred for further testing for myasthenia.  Planning for f/u soon.  States his blood pressures are averaging 741-638 systolic readings.     Past Medical History:  Diagnosis Date  . 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  s/p PTCA of the mid RCA 1/96, also  s/p  CABG x 4 7/96  . Degenerative disc disease   . Diabetes mellitus ([place])   . Glaucoma   . Hypercholesterolemia   . Hypertension   . Migraine headache    history of  . Osteoarthritis   . Sleep apnea    Past Surgical History:  Procedure Laterality Date  . APPENDECTOMY    . CHOLECYSTECTOMY     Removed  . CORONARY ARTERY BYPASS GRAFT  7/96   x4  . HEMORRHOID SURGERY    . HERNIA REPAIR    . UPP and tonsillectomy  1/86   Family History  Problem Relation Age of Onset  . Heart disease Mother        died age 2  . Heart disease Father        and other vascular disease  . Diabetes Unknown        four siblings  . Heart disease Brother        s/p CABG  . Colon cancer Neg Hx   . Prostate cancer Neg Hx    Social History   Socioeconomic History  . Marital status: Married    Spouse name: None  . Number of children: 2  . Years of education: None  . Highest education level: None  Social Needs  . Financial resource strain: Not hard at all  . Food insecurity - worry: Never true  . Food insecurity - inability: Never true  . Transportation needs - medical: No  . Transportation needs - non-medical:  No  Occupational History  . None  Tobacco Use  . Smoking status: Never Smoker  . Smokeless tobacco: Never Used  Substance and Sexual Activity  . Alcohol use: No    Alcohol/week: 0.0 oz  . Drug use: No  . Sexual activity: Not Currently  Other Topics Concern  . None  Social History Narrative  . None    Outpatient Encounter Medications as of 12/21/2017  Medication Sig  . ACCU-CHEK AVIVA PLUS test strip CHECK BLOOD SUGAR ONCE DAILY.  Marland Kitchen ACCU-CHEK SOFTCLIX LANCETS lancets USE AS DIRECTED  . apixaban (ELIQUIS) 5 MG TABS tablet Take by mouth.  Marland Kitchen atorvastatin (LIPITOR) 20 MG tablet TAKE 1 TABLET BY MOUTH EVERY DAY  . calcium citrate-vitamin D (CITRACAL+D) 315-200 MG-UNIT per tablet Take 1 tablet by mouth daily.  . Chlorpheniramine Maleate (ALLERGY RELIEF PO) Take by mouth daily.  . chlorpheniramine-HYDROcodone (TUSSIONEX PENNKINETIC ER) 10-8 MG/5ML SUER Take 5 mLs by mouth every 12 (twelve) hours as needed.  Marland Kitchen FLUoxetine (PROZAC) 10 MG capsule  TAKE 1 CAPSULE BY MOUTH EVERY DAY  . fluticasone (FLONASE) 50 MCG/ACT nasal spray SPRAY 2 SPRAYS IN EACH NOSTRIL ONCE A DAY  . latanoprost (XALATAN) 0.005 % ophthalmic solution Place 1 drop into both eyes at bedtime.  . metoprolol succinate (TOPROL-XL) 50 MG 24 hr tablet Take 50 mg by mouth daily. Take with or immediately following a meal.  . Multiple Vitamin (MULTIVITAMIN) tablet Take 1 tablet by mouth daily.  . Multiple Vitamins-Minerals (PRESERVISION AREDS 2) CAPS Take by mouth daily.  . mupirocin ointment (BACTROBAN) 2 % Apply to affected areas bid  . nitroGLYCERIN (NITROSTAT) 0.4 MG SL tablet Place 0.4 mg under the tongue every 5 (five) minutes as needed. As directed  . pantoprazole (PROTONIX) 40 MG tablet Take 40 mg by mouth daily.   No facility-administered encounter medications on file as of 12/21/2017.     Review of Systems  Constitutional: Negative for appetite change and unexpected weight change.  HENT: Negative for congestion and sinus  pressure.   Respiratory: Negative for cough, chest tightness and shortness of breath.   Cardiovascular: Negative for chest pain, palpitations and leg swelling.  Gastrointestinal: Negative for abdominal pain, diarrhea, nausea and vomiting.  Genitourinary: Negative for difficulty urinating and dysuria.  Musculoskeletal: Negative for joint swelling and myalgias.  Skin: Negative for color change and rash.  Neurological: Negative for dizziness, light-headedness and headaches.  Psychiatric/Behavioral: Negative for agitation and dysphoric mood.       Objective:    Physical Exam  Constitutional: He appears well-developed and well-nourished. No distress.  HENT:  Nose: Nose normal.  Mouth/Throat: Oropharynx is clear and moist.  Neck: Neck supple. No thyromegaly present.  Cardiovascular: Normal rate and regular rhythm.  Pulmonary/Chest: Effort normal and breath sounds normal. No respiratory distress.  Abdominal: Soft. Bowel sounds are normal. There is no tenderness.  Musculoskeletal: He exhibits no edema or tenderness.  Lymphadenopathy:    He has no cervical adenopathy.  Skin: No rash noted. No erythema.  Psychiatric: He has a normal mood and affect. His behavior is normal.    BP (!) 142/84   Pulse 61   Temp 98.4 F (36.9 C) (Oral)   Resp 18   Wt 180 lb 6.4 oz (81.8 kg)   SpO2 98%   BMI 27.43 kg/m  Wt Readings from Last 3 Encounters:  12/21/17 180 lb 6.4 oz (81.8 kg)  10/16/17 180 lb 3.2 oz (81.7 kg)  08/21/17 178 lb 1.9 oz (80.8 kg)     Lab Results  Component Value Date   WBC 5.3 06/08/2017   HGB 15.0 06/08/2017   HCT 44.3 06/08/2017   PLT 164 06/08/2017   GLUCOSE 149 (H) 10/12/2017   CHOL 167 10/12/2017   TRIG 275.0 (H) 10/12/2017   HDL 37.20 (L) 10/12/2017   LDLDIRECT 81.0 10/12/2017   LDLCALC 95 09/05/2016   ALT 23 10/12/2017   AST 22 10/12/2017   NA 138 10/12/2017   K 4.2 10/12/2017   CL 102 10/12/2017   CREATININE 0.98 10/12/2017   BUN 16 10/12/2017   CO2  29 10/12/2017   TSH 3.21 10/12/2017   PSA 0.66 05/05/2016   INR 1.4 02/17/2014   HGBA1C 7.3 (H) 10/12/2017   MICROALBUR 2.2 (H) 10/12/2017    Mr 3d Recon At Scanner  Result Date: 06/21/2017 CLINICAL DATA:  Follow-up cystic pancreatic lesion. No acute symptoms reported. EXAM: MRI ABDOMEN WITHOUT AND WITH CONTRAST (INCLUDING MRCP) TECHNIQUE: Multiplanar multisequence MR imaging of the abdomen was performed both before and after  the administration of intravenous contrast. Heavily T2-weighted images of the biliary and pancreatic ducts were obtained, and three-dimensional MRCP images were rendered by post processing. CONTRAST:  20m MULTIHANCE GADOBENATE DIMEGLUMINE 529 MG/ML IV SOLN COMPARISON:  04/22/2014 MRI abdomen. FINDINGS: Lower chest: No gross abnormality at the lung bases. Sternotomy wires are seen in the lower chest. Hepatobiliary: Normal liver size and configuration. No hepatic steatosis. No liver mass. Cholecystectomy. No biliary ductal dilatation. Common bile duct diameter 4 mm. No choledocholithiasis. Pancreas: There is a 1.3 x 0.9 x 1.4 cm cystic pancreatic lesion in the uncinate process (series 6/ image 28), which demonstrates a lobulated outer contour, a few thin internal septations with perceived septal enhancement and no wall thickening or solid enhancement, previously 1.3 x 0.9 x 1.4 cm on 04/22/2014 MRI using similar measurement technique, stable in size and appearance. No additional pancreatic lesions. No pancreatic duct dilation. No pancreas divisum. Spleen: Normal size. No mass. Adrenals/Urinary Tract: Normal adrenals. No hydronephrosis. There is a 1.1 cm T1 and T2 hyperintense renal cortical lesion in the lateral upper right kidney (series 7/ image 47) without appreciable enhancement on the subtraction images, stable in size, compatible with a Bosniak category 2 hemorrhagic/proteinaceous renal cyst. Additional tiny simple cortical and parapelvic renal cysts in both kidneys. No  suspicious renal masses. Stomach/Bowel: Grossly normal stomach. Visualized small and large bowel is normal caliber, with no bowel wall thickening. Vascular/Lymphatic: Atherosclerotic nonaneurysmal abdominal aorta. Patent portal, splenic, hepatic and renal veins. No pathologically enlarged lymph nodes in the abdomen. Other: No abdominal ascites or focal fluid collection. Musculoskeletal: No aggressive appearing focal osseous lesions. IMPRESSION: 1. Interval stability of 1.4 cm uncinate process cystic pancreatic lesion without high risk MRI features, for which 3 year stability has been demonstrated. A final follow-up MRI abdomen without and with IV contrast is recommended in 2 years. This recommendation follows ACR consensus guidelines: Management of Incidental Pancreatic Cysts: A White Paper of the ACR Incidental Findings Committee. J Am Coll Radiol 2017;14:911-923. 2. Bosniak category 1 and category 2 renal cysts. No suspicious renal masses. 3. Bile ducts are within normal post cholecystectomy limits. 4.  Aortic Atherosclerosis (ICD10-I70.0). Electronically Signed   By: JIlona SorrelM.D.   On: 06/21/2017 11:55   Mr Abdomen Mrcp WMoise BoringContast  Result Date: 06/21/2017 CLINICAL DATA:  Follow-up cystic pancreatic lesion. No acute symptoms reported. EXAM: MRI ABDOMEN WITHOUT AND WITH CONTRAST (INCLUDING MRCP) TECHNIQUE: Multiplanar multisequence MR imaging of the abdomen was performed both before and after the administration of intravenous contrast. Heavily T2-weighted images of the biliary and pancreatic ducts were obtained, and three-dimensional MRCP images were rendered by post processing. CONTRAST:  177mMULTIHANCE GADOBENATE DIMEGLUMINE 529 MG/ML IV SOLN COMPARISON:  04/22/2014 MRI abdomen. FINDINGS: Lower chest: No gross abnormality at the lung bases. Sternotomy wires are seen in the lower chest. Hepatobiliary: Normal liver size and configuration. No hepatic steatosis. No liver mass. Cholecystectomy. No biliary  ductal dilatation. Common bile duct diameter 4 mm. No choledocholithiasis. Pancreas: There is a 1.3 x 0.9 x 1.4 cm cystic pancreatic lesion in the uncinate process (series 6/ image 28), which demonstrates a lobulated outer contour, a few thin internal septations with perceived septal enhancement and no wall thickening or solid enhancement, previously 1.3 x 0.9 x 1.4 cm on 04/22/2014 MRI using similar measurement technique, stable in size and appearance. No additional pancreatic lesions. No pancreatic duct dilation. No pancreas divisum. Spleen: Normal size. No mass. Adrenals/Urinary Tract: Normal adrenals. No hydronephrosis. There is a 1.1  cm T1 and T2 hyperintense renal cortical lesion in the lateral upper right kidney (series 7/ image 47) without appreciable enhancement on the subtraction images, stable in size, compatible with a Bosniak category 2 hemorrhagic/proteinaceous renal cyst. Additional tiny simple cortical and parapelvic renal cysts in both kidneys. No suspicious renal masses. Stomach/Bowel: Grossly normal stomach. Visualized small and large bowel is normal caliber, with no bowel wall thickening. Vascular/Lymphatic: Atherosclerotic nonaneurysmal abdominal aorta. Patent portal, splenic, hepatic and renal veins. No pathologically enlarged lymph nodes in the abdomen. Other: No abdominal ascites or focal fluid collection. Musculoskeletal: No aggressive appearing focal osseous lesions. IMPRESSION: 1. Interval stability of 1.4 cm uncinate process cystic pancreatic lesion without high risk MRI features, for which 3 year stability has been demonstrated. A final follow-up MRI abdomen without and with IV contrast is recommended in 2 years. This recommendation follows ACR consensus guidelines: Management of Incidental Pancreatic Cysts: A White Paper of the ACR Incidental Findings Committee. J Am Coll Radiol 2017;14:911-923. 2. Bosniak category 1 and category 2 renal cysts. No suspicious renal masses. 3. Bile ducts  are within normal post cholecystectomy limits. 4.  Aortic Atherosclerosis (ICD10-I70.0). Electronically Signed   By: Ilona Sorrel M.D.   On: 06/21/2017 11:55       Assessment & Plan:   Problem List Items Addressed This Visit    A-fib ([place])    In SR.  Followed by cardiology.  On eliquis.        Abnormal chest CT    Followed by Dr Raul Del.  Evaluated 09/26/17.  Recommended f/u cxr in 6 months.        Ascending aortic aneurysm ([place])    Evaluated by Dr Lucky Cowboy 12/2016.  Felt small change.  Recommended f/u in 12 months.        CAD (coronary artery disease)    Followed by cardiology.  Stable.  Continue risk factor modification.        Double vision    Episodes have decreased.  On eliquis.  Had MRI/MRA as outlined previously.  Saw neurology.  Referred to a neuromuscular specialist.  Undergoing w/up for myasthenia.  Follow.        Hypercholesterolemia    On lipitor.  Low cholesterol diet and exercise.  Follow lipid panel and liver function tests.        Relevant Orders   Hepatic function panel   Lipid panel   Hypertension    Blood pressure on recheck improved.  His checks under good control.  Continue same medication regimen.  Follow pressures.        Type 2 diabetes mellitus with complication, without long-term current use of insulin ([facility])    Low carb diet and exercise.  Follow met b and a1c.        Relevant Orders   Basic metabolic panel       Einar Pheasant, MD

## 2017-12-23 ENCOUNTER — Encounter: Payer: Self-pay | Admitting: Internal Medicine

## 2017-12-23 NOTE — Assessment & Plan Note (Signed)
On lipitor.  Low cholesterol diet and exercise.  Follow lipid panel and liver function tests.   

## 2017-12-23 NOTE — Assessment & Plan Note (Signed)
Evaluated by Dr Lucky Cowboy 12/2016.  Felt small change.  Recommended f/u in 12 months.

## 2017-12-23 NOTE — Assessment & Plan Note (Signed)
In SR.  Followed by cardiology.  On eliquis.   

## 2017-12-23 NOTE — Assessment & Plan Note (Signed)
Followed by Dr Raul Del.  Evaluated 09/26/17.  Recommended f/u cxr in 6 months.

## 2017-12-23 NOTE — Assessment & Plan Note (Signed)
Low carb diet and exercise.  Follow met b and a1c.   

## 2017-12-23 NOTE — Assessment & Plan Note (Signed)
Blood pressure on recheck improved.  His checks under good control.  Continue same medication regimen.  Follow pressures.

## 2017-12-23 NOTE — Assessment & Plan Note (Signed)
Followed by cardiology.  Stable.  Continue risk factor modification.   

## 2017-12-23 NOTE — Assessment & Plan Note (Signed)
Episodes have decreased.  On eliquis.  Had MRI/MRA as outlined previously.  Saw neurology.  Referred to a neuromuscular specialist.  Undergoing w/up for myasthenia.  Follow.

## 2017-12-25 ENCOUNTER — Encounter: Payer: Self-pay | Admitting: Internal Medicine

## 2017-12-26 ENCOUNTER — Other Ambulatory Visit: Payer: Self-pay | Admitting: Internal Medicine

## 2017-12-26 DIAGNOSIS — G7000 Myasthenia gravis without (acute) exacerbation: Secondary | ICD-10-CM | POA: Diagnosis not present

## 2018-01-03 ENCOUNTER — Encounter: Payer: Self-pay | Admitting: Internal Medicine

## 2018-01-15 DIAGNOSIS — R300 Dysuria: Secondary | ICD-10-CM | POA: Diagnosis not present

## 2018-01-15 DIAGNOSIS — N390 Urinary tract infection, site not specified: Secondary | ICD-10-CM | POA: Diagnosis not present

## 2018-01-15 DIAGNOSIS — R509 Fever, unspecified: Secondary | ICD-10-CM | POA: Diagnosis not present

## 2018-01-18 ENCOUNTER — Other Ambulatory Visit: Payer: Self-pay | Admitting: Internal Medicine

## 2018-01-24 ENCOUNTER — Other Ambulatory Visit: Payer: Self-pay | Admitting: Internal Medicine

## 2018-01-26 DIAGNOSIS — N390 Urinary tract infection, site not specified: Secondary | ICD-10-CM | POA: Diagnosis not present

## 2018-01-26 DIAGNOSIS — R319 Hematuria, unspecified: Secondary | ICD-10-CM | POA: Diagnosis not present

## 2018-01-26 DIAGNOSIS — R300 Dysuria: Secondary | ICD-10-CM | POA: Diagnosis not present

## 2018-01-28 ENCOUNTER — Ambulatory Visit (INDEPENDENT_AMBULATORY_CARE_PROVIDER_SITE_OTHER): Payer: PPO | Admitting: Internal Medicine

## 2018-01-28 ENCOUNTER — Encounter: Payer: Self-pay | Admitting: Internal Medicine

## 2018-01-28 VITALS — BP 140/70 | HR 65 | Temp 97.6°F | Resp 15 | Ht 68.0 in | Wt 179.4 lb

## 2018-01-28 DIAGNOSIS — R300 Dysuria: Secondary | ICD-10-CM | POA: Diagnosis not present

## 2018-01-28 DIAGNOSIS — N3000 Acute cystitis without hematuria: Secondary | ICD-10-CM | POA: Diagnosis not present

## 2018-01-28 DIAGNOSIS — N390 Urinary tract infection, site not specified: Secondary | ICD-10-CM | POA: Insufficient documentation

## 2018-01-28 LAB — POCT URINALYSIS DIPSTICK
Blood, UA: NEGATIVE
GLUCOSE UA: NEGATIVE
Ketones, UA: NEGATIVE
Nitrite, UA: POSITIVE
PH UA: 6 (ref 5.0–8.0)
Protein, UA: NEGATIVE
Spec Grav, UA: >=1.03 — AB (ref 1.010–1.025)
UROBILINOGEN UA: 2 U/dL — AB

## 2018-01-28 LAB — URINALYSIS, MICROSCOPIC ONLY

## 2018-01-28 MED ORDER — SULFAMETHOXAZOLE-TRIMETHOPRIM 800-160 MG PO TABS: 1.0000 | ORAL_TABLET | Freq: Two times a day (BID) | ORAL | 0 refills | Status: DC

## 2018-01-28 NOTE — Progress Notes (Signed)
Subjective:  Patient ID: Kyle Richardson, male    DOB: [birth year]  Age: 84 y.o. MRN: [id number]  CC: The primary encounter diagnosis was Dysuria. A diagnosis of Acute cystitis without hematuria was also pertinent to this visit.  HPI Semir Brill presents for recurrent dysuria, urgency.  Symptoms of fever, urgency, back pain and dysuria  started on April 9 .  He was treated on April 9 by [facility] with  Empiric omnicef bid 300 mg bid for 7 days .  Culture grew E Coli pan sensitivie.   Symptoms resolved, but returned  On Day 8. one day after finishing the [place] .  (finished 1st round on April 16. Returned to [facility]  on April 20th,  Given one dose ceftriaxone IM injection,  followed by an rx for oral doxycycline.  No repeat culture was done, for unclear reasons .   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  ( he is a diabetic and was treated for clinical  Suspicion of pneumonia )    Reviewed allergies.  He is not sure what form of allergy to septra ? He does not recall any histor yof tongue swelling or hives .Marland Kitchen       Outpatient Medications Prior to Visit  Medication Sig Dispense Refill  . ACCU-CHEK SOFTCLIX LANCETS lancets USE AS DIRECTED 100 each 1  . apixaban (ELIQUIS) 5 MG TABS tablet Take by mouth.    Marland Kitchen atorvastatin (LIPITOR) 20 MG tablet TAKE 1 TABLET BY MOUTH EVERY DAY 30 tablet 3  . calcium citrate-vitamin D (CITRACAL+D) 315-200 MG-UNIT per tablet Take 1 tablet by mouth daily.    . Chlorpheniramine Maleate (ALLERGY RELIEF PO) Take by mouth daily.    . chlorpheniramine-HYDROcodone (TUSSIONEX PENNKINETIC ER) 10-8 MG/5ML SUER Take 5 mLs by mouth every 12 (twelve) hours as needed. 115 mL 0  . FLUoxetine (PROZAC) 10 MG capsule TAKE 1 CAPSULE BY MOUTH EVERY DAY 30 capsule 3  . fluticasone (FLONASE) 50 MCG/ACT nasal spray SPRAY 2 SPRAYS IN EACH NOSTRIL ONCE A DAY 16 g 0  . latanoprost (XALATAN) 0.005 % ophthalmic solution Place 1 drop  into both eyes at bedtime.    . metoprolol succinate (TOPROL-XL) 50 MG 24 hr tablet Take 50 mg by mouth daily. Take with or immediately following a meal.    . Multiple Vitamin (MULTIVITAMIN) tablet Take 1 tablet by mouth daily.    . Multiple Vitamins-Minerals (PRESERVISION AREDS 2) CAPS Take by mouth daily.    . mupirocin ointment (BACTROBAN) 2 % Apply to affected areas bid 22 g 0  . nitroGLYCERIN (NITROSTAT) 0.4 MG SL tablet Place 0.4 mg under the tongue every 5 (five) minutes as needed. As directed    . ONE TOUCH ULTRA TEST test strip CHECK BLOOD SUGAR TWICE A DAY 180 each 3  . pantoprazole (PROTONIX) 40 MG tablet Take 40 mg by mouth daily.     No facility-administered medications prior to visit.     Review of Systems;  Patient denies headache, fevers, malaise, unintentional weight loss, skin rash, eye pain, sinus congestion and sinus pain, sore throat, dysphagia,  hemoptysis , cough, dyspnea, wheezing, chest pain, palpitations, orthopnea, edema, abdominal pain, nausea, melena, diarrhea, constipation, flank pain, dysuria, hematuria, urinary  Frequency, nocturia, numbness, tingling, seizures,  Focal weakness, Loss of consciousness,  Tremor, insomnia, depression, anxiety, and suicidal ideation.      Objective:  BP 140/70 (BP Location: Left Arm, Patient Position: Sitting,  Cuff Size: Normal)   Pulse 65   Temp 97.6 F (36.4 C) (Oral)   Resp 15   Ht 5\' 8"  (1.727 m)   Wt 179 lb 6.4 oz (81.4 kg)   SpO2 92%   BMI 27.28 kg/m   BP Readings from Last 3 Encounters:  01/28/18 140/70  12/23/17 (!) 142/84  10/20/17 (!) 146/68    Wt Readings from Last 3 Encounters:  01/28/18 179 lb 6.4 oz (81.4 kg)  12/21/17 180 lb 6.4 oz (81.8 kg)  10/16/17 180 lb 3.2 oz (81.7 kg)    General appearance: alert, cooperative and appears stated age Ears: normal TM's and external ear canals both ears Throat: lips, mucosa, and tongue normal; teeth and gums normal Neck: no adenopathy, no carotid bruit,  supple, symmetrical, trachea midline and thyroid not enlarged, symmetric, no tenderness/mass/nodules Back: symmetric, no curvature. ROM normal. No CVA tenderness. Lungs: clear to auscultation bilaterally Heart: regular rate and rhythm, S1, S2 normal, no murmur, click, rub or gallop Abdomen: soft, non-tender; bowel sounds normal; no masses,  no organomegaly Pulses: 2+ and symmetric Skin: Skin color, texture, turgor normal. No rashes or lesions Lymph nodes: Cervical, supraclavicular, and axillary nodes normal.  Lab Results  Component Value Date   HGBA1C 7.3 (H) 10/12/2017   HGBA1C 6.9 (H) 09/05/2016   HGBA1C 6.9 (H) 05/05/2016    Lab Results  Component Value Date   CREATININE 0.98 10/12/2017   CREATININE 0.95 10/12/2017   CREATININE 0.90 06/21/2017    Lab Results  Component Value Date   WBC 5.3 06/08/2017   HGB 15.0 06/08/2017   HCT 44.3 06/08/2017   PLT 164 06/08/2017   GLUCOSE 149 (H) 10/12/2017   CHOL 167 10/12/2017   TRIG 275.0 (H) 10/12/2017   HDL 37.20 (L) 10/12/2017   LDLDIRECT 81.0 10/12/2017   LDLCALC 95 09/05/2016   ALT 23 10/12/2017   AST 22 10/12/2017   NA 138 10/12/2017   K 4.2 10/12/2017   CL 102 10/12/2017   CREATININE 0.98 10/12/2017   BUN 16 10/12/2017   CO2 29 10/12/2017   TSH 3.21 10/12/2017   PSA 0.66 05/05/2016   INR 1.4 02/17/2014   HGBA1C 7.3 (H) 10/12/2017   MICROALBUR 2.2 (H) 10/12/2017    Mr 3d Recon At Scanner  Result Date: 06/21/2017 CLINICAL DATA:  Follow-up cystic pancreatic lesion. No acute symptoms reported. EXAM: MRI ABDOMEN WITHOUT AND WITH CONTRAST (INCLUDING MRCP) TECHNIQUE: Multiplanar multisequence MR imaging of the abdomen was performed both before and after the administration of intravenous contrast. Heavily T2-weighted images of the biliary and pancreatic ducts were obtained, and three-dimensional MRCP images were rendered by post processing. CONTRAST:  91mL MULTIHANCE GADOBENATE DIMEGLUMINE 529 MG/ML IV SOLN COMPARISON:   04/22/2014 MRI abdomen. FINDINGS: Lower chest: No gross abnormality at the lung bases. Sternotomy wires are seen in the lower chest. Hepatobiliary: Normal liver size and configuration. No hepatic steatosis. No liver mass. Cholecystectomy. No biliary ductal dilatation. Common bile duct diameter 4 mm. No choledocholithiasis. Pancreas: There is a 1.3 x 0.9 x 1.4 cm cystic pancreatic lesion in the uncinate process (series 6/ image 28), which demonstrates a lobulated outer contour, a few thin internal septations with perceived septal enhancement and no wall thickening or solid enhancement, previously 1.3 x 0.9 x 1.4 cm on 04/22/2014 MRI using similar measurement technique, stable in size and appearance. No additional pancreatic lesions. No pancreatic duct dilation. No pancreas divisum. Spleen: Normal size. No mass. Adrenals/Urinary Tract: Normal adrenals. No hydronephrosis. There is a  1.1 cm T1 and T2 hyperintense renal cortical lesion in the lateral upper right kidney (series 7/ image 47) without appreciable enhancement on the subtraction images, stable in size, compatible with a Bosniak category 2 hemorrhagic/proteinaceous renal cyst. Additional tiny simple cortical and parapelvic renal cysts in both kidneys. No suspicious renal masses. Stomach/Bowel: Grossly normal stomach. Visualized small and large bowel is normal caliber, with no bowel wall thickening. Vascular/Lymphatic: Atherosclerotic nonaneurysmal abdominal aorta. Patent portal, splenic, hepatic and renal veins. No pathologically enlarged lymph nodes in the abdomen. Other: No abdominal ascites or focal fluid collection. Musculoskeletal: No aggressive appearing focal osseous lesions. IMPRESSION: 1. Interval stability of 1.4 cm uncinate process cystic pancreatic lesion without high risk MRI features, for which 3 year stability has been demonstrated. A final follow-up MRI abdomen without and with IV contrast is recommended in 2 years. This recommendation follows  ACR consensus guidelines: Management of Incidental Pancreatic Cysts: A White Paper of the ACR Incidental Findings Committee. J Am Coll Radiol 2017;14:911-923. 2. Bosniak category 1 and category 2 renal cysts. No suspicious renal masses. 3. Bile ducts are within normal post cholecystectomy limits. 4.  Aortic Atherosclerosis (ICD10-I70.0). Electronically Signed   By: Ilona Sorrel M.D.   On: 06/21/2017 11:55   Mr Abdomen Mrcp Moise Boring Contast  Result Date: 06/21/2017 CLINICAL DATA:  Follow-up cystic pancreatic lesion. No acute symptoms reported. EXAM: MRI ABDOMEN WITHOUT AND WITH CONTRAST (INCLUDING MRCP) TECHNIQUE: Multiplanar multisequence MR imaging of the abdomen was performed both before and after the administration of intravenous contrast. Heavily T2-weighted images of the biliary and pancreatic ducts were obtained, and three-dimensional MRCP images were rendered by post processing. CONTRAST:  40mL MULTIHANCE GADOBENATE DIMEGLUMINE 529 MG/ML IV SOLN COMPARISON:  04/22/2014 MRI abdomen. FINDINGS: Lower chest: No gross abnormality at the lung bases. Sternotomy wires are seen in the lower chest. Hepatobiliary: Normal liver size and configuration. No hepatic steatosis. No liver mass. Cholecystectomy. No biliary ductal dilatation. Common bile duct diameter 4 mm. No choledocholithiasis. Pancreas: There is a 1.3 x 0.9 x 1.4 cm cystic pancreatic lesion in the uncinate process (series 6/ image 28), which demonstrates a lobulated outer contour, a few thin internal septations with perceived septal enhancement and no wall thickening or solid enhancement, previously 1.3 x 0.9 x 1.4 cm on 04/22/2014 MRI using similar measurement technique, stable in size and appearance. No additional pancreatic lesions. No pancreatic duct dilation. No pancreas divisum. Spleen: Normal size. No mass. Adrenals/Urinary Tract: Normal adrenals. No hydronephrosis. There is a 1.1 cm T1 and T2 hyperintense renal cortical lesion in the lateral upper  right kidney (series 7/ image 47) without appreciable enhancement on the subtraction images, stable in size, compatible with a Bosniak category 2 hemorrhagic/proteinaceous renal cyst. Additional tiny simple cortical and parapelvic renal cysts in both kidneys. No suspicious renal masses. Stomach/Bowel: Grossly normal stomach. Visualized small and large bowel is normal caliber, with no bowel wall thickening. Vascular/Lymphatic: Atherosclerotic nonaneurysmal abdominal aorta. Patent portal, splenic, hepatic and renal veins. No pathologically enlarged lymph nodes in the abdomen. Other: No abdominal ascites or focal fluid collection. Musculoskeletal: No aggressive appearing focal osseous lesions. IMPRESSION: 1. Interval stability of 1.4 cm uncinate process cystic pancreatic lesion without high risk MRI features, for which 3 year stability has been demonstrated. A final follow-up MRI abdomen without and with IV contrast is recommended in 2 years. This recommendation follows ACR consensus guidelines: Management of Incidental Pancreatic Cysts: A White Paper of the ACR Incidental Findings Committee. [place] 4235;36:144-315. 2.  Bosniak category 1 and category 2 renal cysts. No suspicious renal masses. 3. Bile ducts are within normal post cholecystectomy limits. 4.  Aortic Atherosclerosis (ICD10-I70.0). Electronically Signed   By: Ilona Sorrel M.D.   On: 06/21/2017 11:55    Assessment & Plan:   Problem List Items Addressed This Visit    UTI (urinary tract infection)    Recurrent ,  Presumed to be persistent E Coli due to inadequate length of treatment by intial provider at [facility].   Notes reviewed. I do not agree with empiric  choice of doxycycline and have recommended cautious trial of Septra DS given patient's unclear allergy history for same.  wil treat for 2 weeks if tolerated, pending repeat urine culture.  Probiotic use advised for 3 weeks       Relevant Medications   sulfamethoxazole-trimethoprim  (BACTRIM DS,SEPTRA DS) 800-160 MG tablet    Other Visit Diagnoses    Dysuria    -  Primary   Relevant Orders   POCT Urinalysis Dipstick (Completed)   Urine Culture   Urine Microscopic Only    A total of 25 minutes of face to face time was spent with patient more than half of which was spent in counselling about the above mentioned conditions  and coordination of care .  I am having Kiley Sammarco start on sulfamethoxazole-trimethoprim. I am also having him maintain his nitroGLYCERIN, latanoprost, PRESERVISION AREDS 2, ACCU-CHEK SOFTCLIX LANCETS, multivitamin, calcium citrate-vitamin D, pantoprazole, Chlorpheniramine Maleate (ALLERGY RELIEF PO), mupirocin ointment, apixaban, chlorpheniramine-HYDROcodone, metoprolol succinate, fluticasone, FLUoxetine, atorvastatin, and ONE TOUCH ULTRA TEST.  Meds ordered this encounter  Medications  . sulfamethoxazole-trimethoprim (BACTRIM DS,SEPTRA DS) 800-160 MG tablet    Sig: Take 1 tablet by mouth 2 (two) times daily.    Dispense:  28 tablet    Refill:  0    There are no discontinued medications.  Follow-up: No follow-ups on file.   Crecencio Mc, MD

## 2018-01-28 NOTE — Patient Instructions (Signed)
I am treating you for 2 weeks with a different antibiotic called Septra DS  Twice daily.  Stop the doxycyclin.   If you develop a reaction to (hives,  Tongue swelling,  Itching) take benadryl 25 mg and call us immediately   Taking an antibiotic can create an imbalance in the normal population of bacteria that live in the small intestine.  This imbalance can persist for 3 months.   Taking a probiotic ( Align, Floraque or Culturelle), the generic version of one of these over the counter medications, or an alternative form   Yogurt,  Is a great  dietary source) for a minimum of 3 weeks may help prevent a serious antibiotic associated diarrhea  Called clostridium dificile colitis that occurs when the bacteria population is altered .

## 2018-01-28 NOTE — Assessment & Plan Note (Addendum)
Recurrent ,  Presumed to be persistent E Coli due to inadequate length of treatment by intial provider at [facility].   Notes reviewed. I do not agree with empiric  choice of doxycycline and have recommended cautious trial of Septra DS given patient's unclear allergy history for same.  wil treat for 2 weeks if tolerated, pending repeat urine culture.  Probiotic use advised for 3 weeks

## 2018-01-29 LAB — URINE CULTURE
MICRO NUMBER:: 90489355
Result:: NO GROWTH
SPECIMEN QUALITY:: ADEQUATE

## 2018-01-31 ENCOUNTER — Telehealth: Payer: Self-pay | Admitting: Internal Medicine

## 2018-01-31 NOTE — Telephone Encounter (Signed)
Returned patients call and informed him of his lab results patient verbalized understanding. I also informed patient that his results are on my chart.

## 2018-01-31 NOTE — Telephone Encounter (Signed)
Copied from [place] [id number]. Topic: Quick Communication - See Telephone Encounter >> Jan 31, 2018  4:06 PM Cleaster Corin, NT wrote: CRM for notification. See Telephone encounter for: 01/31/18.  Pt. Calling to receive Lab results

## 2018-02-04 DIAGNOSIS — M25561 Pain in right knee: Secondary | ICD-10-CM | POA: Diagnosis not present

## 2018-02-04 DIAGNOSIS — M11262 Other chondrocalcinosis, left knee: Secondary | ICD-10-CM | POA: Diagnosis not present

## 2018-02-04 DIAGNOSIS — M11261 Other chondrocalcinosis, right knee: Secondary | ICD-10-CM | POA: Diagnosis not present

## 2018-02-04 DIAGNOSIS — M25562 Pain in left knee: Secondary | ICD-10-CM | POA: Diagnosis not present

## 2018-02-04 DIAGNOSIS — M174 Other bilateral secondary osteoarthritis of knee: Secondary | ICD-10-CM | POA: Diagnosis not present

## 2018-02-19 ENCOUNTER — Ambulatory Visit (INDEPENDENT_AMBULATORY_CARE_PROVIDER_SITE_OTHER): Payer: PPO | Admitting: Family Medicine

## 2018-02-19 ENCOUNTER — Encounter: Payer: Self-pay | Admitting: Family Medicine

## 2018-02-19 VITALS — BP 140/64 | HR 71 | Temp 98.3°F | Resp 16 | Wt 173.4 lb

## 2018-02-19 DIAGNOSIS — W57XXXA Bitten or stung by nonvenomous insect and other nonvenomous arthropods, initial encounter: Secondary | ICD-10-CM

## 2018-02-19 DIAGNOSIS — S30863A Insect bite (nonvenomous) of scrotum and testes, initial encounter: Secondary | ICD-10-CM

## 2018-02-19 NOTE — Patient Instructions (Signed)
Please take two tablets of the doxycycline that you have at once with food. Also, please avoid direct sunlight with this medication as it can cause sun sensitivity.  You can clean area with soap and water and apply antibiotic ointment.  See information below for prevention and follow up if needed.   Tick Bite Information, Adult Ticks are insects that can bite. Most ticks live in shrubs and grassy areas. They climb onto people and animals that go by. Then they bite. Some ticks carry germs that can make you sick. How can I prevent tick bites?  Use an insect repellent that has 20% or higher of the ingredients DEET, picaridin, or [id number]. Put this insect repellent on: ? Bare skin. ? The tops of your boots. ? Your pant legs. ? The ends of your sleeves.  If you use an insect repellent that has the ingredient permethrin, make sure to follow the instructions on the bottle. Treat the following: ? Clothing. ? Supplies. ? Boots. ? Tents.  Wear long sleeves, long pants, and light colors.  Tuck your pant legs into your socks.  Stay in the middle of the trail.  Try not to walk through long grass.  Before going inside your house, check your clothes, hair, and skin for ticks. Make sure to check your head, neck, armpits, waist, groin, and joint areas.  Check for ticks every day.  When you come indoors: ? Wash your clothes right away. ? Shower right away. ? Dry your clothes in a dryer on high heat for 60 minutes or more. What is the right way to remove a tick? Remove a tick from your skin as soon as possible.  To remove a tick that is crawling on your skin: ? Go outdoors and brush the tick off. ? Use tape or a lint roller.  To remove a tick that is biting: ? Wash your hands. ? If you have latex gloves, put them on. ? Use tweezers, curved forceps, or a tick-removal tool to grasp the tick. Grasp the tick as close to your skin and as close to the tick's head as possible. ? Gently pull up  until the tick lets go.  Try to keep the tick's head attached to its body.  Do not twist or jerk the tick.  Do not squeeze or crush the tick.  Do not try to remove a tick with heat, alcohol, petroleum jelly, or fingernail polish. How should I get rid of a tick? Here are some ways to get rid of a tick that is alive:  Place the tick in rubbing alcohol.  Place the tick in a bag or container you can close tightly.  Wrap the tick tightly in tape.  Flush the tick down the toilet.  Contact a doctor if:  You have symptoms of a disease, such as: ? Pain in a muscle, joint, or bone. ? Trouble walking or moving your legs. ? Numbness in your legs. ? Inability to move (paralysis). ? A red rash that makes a circle (bull's-eye rash). ? Redness and swelling where the tick bit you. ? A fever. ? Throwing up (vomiting) over and over. ? Diarrhea. ? Weight loss. ? Tender and swollen lymph glands. ? Shortness of breath. ? Cough. ? Belly pain (abdominal pain). ? Headache. ? Being more tired than normal. ? A change in how alert (conscious) you are. ? Confusion. Get help right away if:  You cannot remove a tick.  A part of a tick breaks off  and gets stuck in your skin.  You are feeling worse. Summary  Ticks may carry germs that can make you sick.  To prevent tick bites, wear long sleeves, long pants, and light colors. Use insect repellent. Follow the instructions on the bottle.  If the tick is biting, do not try to remove it with heat, alcohol, petroleum jelly, or fingernail polish.  Use tweezers, curved forceps, or a tick-removal tool to grasp the tick. Gently pull up until the tick lets go. Do not twist or jerk the tick. Do not squeeze or crush the tick.  If you have symptoms, contact a doctor. This information is not intended to replace advice given to you by your health care provider. Make sure you discuss any questions you have with your health care provider. Document Released:  12/20/2009 Document Revised: 01/05/2017 Document Reviewed: 01/05/2017 Elsevier Interactive Patient Education  2018 [employer].

## 2018-02-19 NOTE — Progress Notes (Signed)
Subjective:    Patient ID: Kyle Richardson, male    DOB: 1928-04-11, 84 y.o.   MRN: [id number]  HPI  Kyle Richardson is an 84 year old male who presents today with a tick bite on his scrotum that he noticed this morning. He does not know when this was attached however reports that it was not engorged.  He has attempted to remove the tick however did not fully remove the tick and found it difficult to determine if body parts of the tick remain. He denies fever, chills, sweats, N/V/D, HA, fatigue, or myalgias. Treatment: Partial removal of tick.  He denies using preventive measures of wearing repellant when outside.   Review of Systems  Constitutional: Negative for chills, fatigue and fever.  Respiratory: Negative for cough, shortness of breath and wheezing.   Cardiovascular: Negative for chest pain and palpitations.  Gastrointestinal: Negative for abdominal pain, blood in stool, diarrhea, nausea and vomiting.  Musculoskeletal: Negative for myalgias.  Skin: Negative for rash.  Neurological: Negative for headaches.   Past Medical History:  Diagnosis Date  . 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  s/p PTCA of the mid RCA 1/96, also  s/p  CABG x 4 7/96  . Degenerative disc disease   . Diabetes mellitus ([place])   . Glaucoma   . Hypercholesterolemia   . Hypertension   . Migraine headache    history of  . Osteoarthritis   . Sleep apnea      Social History   Socioeconomic History  . Marital status: Married    Spouse name: Not on file  . Number of children: 2  . Years of education: Not on file  . Highest education level: Not on file  Occupational History  . Not on file  Social Needs  . Financial resource strain: Not hard at all  . Food insecurity:    Worry: Never true    Inability: Never true  . Transportation needs:    Medical: No    Non-medical: No  Tobacco Use  . Smoking status: Never Smoker  . Smokeless tobacco: Never Used  Substance and  Sexual Activity  . Alcohol use: No    Alcohol/week: 0.0 oz  . Drug use: No  . Sexual activity: Not Currently  Lifestyle  . Physical activity:    Days per week: Not on file    Minutes per session: Not on file  . Stress: Not on file  Relationships  . Social connections:    Talks on phone: Patient refused    Gets together: Patient refused    Attends religious service: Patient refused    Active member of club or organization: Patient refused    Attends meetings of clubs or organizations: Patient refused    Relationship status: Patient refused  . Intimate partner violence:    Fear of current or ex partner: Patient refused    Emotionally abused: Patient refused    Physically abused: Patient refused    Forced sexual activity: Patient refused  Other Topics Concern  . Not on file  Social History Narrative  . Not on file    Past Surgical History:  Procedure Laterality Date  . APPENDECTOMY    . CHOLECYSTECTOMY     Removed  . CORONARY ARTERY BYPASS GRAFT  7/96   x4  . HEMORRHOID SURGERY    . HERNIA REPAIR    . UPP and tonsillectomy  1/86    Family History  Problem  Relation Age of Onset  . Heart disease Mother        died age 28  . Heart disease Father        and other vascular disease  . Diabetes Unknown        four siblings  . Heart disease Brother        s/p CABG  . Colon cancer Neg Hx   . Prostate cancer Neg Hx     Allergies  Allergen Reactions  . Clarithromycin Other (See Comments) and Diarrhea  . Codeine   . Flomax [Tamsulosin Hcl]   . Lamisil [Terbinafine]   . Levofloxacin Other (See Comments)    diplopia  . Sertraline Other (See Comments)  . Sulfa Antibiotics     Current Outpatient Medications on File Prior to Visit  Medication Sig Dispense Refill  . ACCU-CHEK SOFTCLIX LANCETS lancets USE AS DIRECTED 100 each 1  . apixaban (ELIQUIS) 5 MG TABS tablet Take by mouth.    Marland Kitchen atorvastatin (LIPITOR) 20 MG tablet TAKE 1 TABLET BY MOUTH EVERY DAY 30 tablet 3    . calcium citrate-vitamin D (CITRACAL+D) 315-200 MG-UNIT per tablet Take 1 tablet by mouth daily.    Marland Kitchen FLUoxetine (PROZAC) 10 MG capsule TAKE 1 CAPSULE BY MOUTH EVERY DAY 30 capsule 3  . fluticasone (FLONASE) 50 MCG/ACT nasal spray SPRAY 2 SPRAYS IN EACH NOSTRIL ONCE A DAY 16 g 0  . latanoprost (XALATAN) 0.005 % ophthalmic solution Place 1 drop into both eyes at bedtime.    Marland Kitchen loratadine (CLARITIN) 10 MG tablet Take 10 mg by mouth daily.    . metoprolol succinate (TOPROL-XL) 50 MG 24 hr tablet Take 50 mg by mouth daily. Take with or immediately following a meal.    . Multiple Vitamin (MULTIVITAMIN) tablet Take 1 tablet by mouth daily.    . Multiple Vitamins-Minerals (PRESERVISION AREDS 2) CAPS Take by mouth daily.    . mupirocin ointment (BACTROBAN) 2 % Apply to affected areas bid 22 g 0  . nitroGLYCERIN (NITROSTAT) 0.4 MG SL tablet Place 0.4 mg under the tongue every 5 (five) minutes as needed. As directed    . ONE TOUCH ULTRA TEST test strip CHECK BLOOD SUGAR TWICE A DAY 180 each 3  . pantoprazole (PROTONIX) 40 MG tablet Take 40 mg by mouth daily.     No current facility-administered medications on file prior to visit.     BP 140/64 (BP Location: Left Arm, Patient Position: Sitting, Cuff Size: Normal)   Pulse 71   Temp 98.3 F (36.8 C) (Oral)   Resp 16   Wt 173 lb 6 oz (78.6 kg)   SpO2 96%   BMI 26.36 kg/m       Objective:   Physical Exam  Constitutional: He is oriented to person, place, and time. He appears well-developed and well-nourished.  Eyes: Pupils are equal, round, and reactive to light. No scleral icterus.  Neck: Neck supple.  Cardiovascular: Normal rate and regular rhythm.  Pulmonary/Chest: Effort normal and breath sounds normal.  Genitourinary:  Genitourinary Comments: Small tick attached to scrotum. Tick has been partially removed by patient and remaining body parts are present. Area cleansed with alcohol swab, removal of remaining tick body parts with small  forceps completed to the best of my ability. Alcohol used following removal to clean area, no bleeding present. No evidence of rash or erythema present.  Lymphadenopathy:    He has no cervical adenopathy.  Neurological: He is alert and oriented to person, place,  and time.  Skin: Skin is warm. No erythema.  Psychiatric: He has a normal mood and affect. His behavior is normal. Judgment and thought content normal.      Assessment & Plan:  1. Insect bite of scrotum, initial encounter Unclear timeframe that tick was attached to scrotum however patient reports that he does not believe that it has been attached more than 72 hours. We discussed antibiotic prophylaxis guidelines of tick attachment > 36 hours and prophylaxis within 72 hours. While unclear regarding 36 hours, patient is able to take doxycycline and has this prescription on hand today. Advised single dose 200 mg of doxycycline and keeping area clean and dry. Further advised he can use OTC antibiotic ointment to site and he should monitor for any symptoms such as a rash for the next 30 days. Also discussed if body parts remain, they are expected to expel spontaneously.  Provided written return precautions and preventive measures to consider.  Delano Metz, FNP-C

## 2018-02-20 ENCOUNTER — Other Ambulatory Visit: Payer: Self-pay | Admitting: Internal Medicine

## 2018-02-21 DIAGNOSIS — R208 Other disturbances of skin sensation: Secondary | ICD-10-CM | POA: Diagnosis not present

## 2018-02-21 DIAGNOSIS — H532 Diplopia: Secondary | ICD-10-CM | POA: Diagnosis not present

## 2018-02-21 DIAGNOSIS — R278 Other lack of coordination: Secondary | ICD-10-CM | POA: Diagnosis not present

## 2018-02-21 DIAGNOSIS — R471 Dysarthria and anarthria: Secondary | ICD-10-CM | POA: Diagnosis not present

## 2018-02-21 DIAGNOSIS — R2689 Other abnormalities of gait and mobility: Secondary | ICD-10-CM | POA: Diagnosis not present

## 2018-02-21 DIAGNOSIS — G7000 Myasthenia gravis without (acute) exacerbation: Secondary | ICD-10-CM | POA: Diagnosis not present

## 2018-02-22 ENCOUNTER — Inpatient Hospital Stay
Admission: EM | Admit: 2018-02-22 | Discharge: 2018-02-27 | DRG: 065 | Disposition: A | Discharge status: Rehabilitation Facility | Payer: PPO | Attending: Internal Medicine | Admitting: Internal Medicine

## 2018-02-22 ENCOUNTER — Ambulatory Visit: Payer: PPO | Admitting: Internal Medicine

## 2018-02-22 ENCOUNTER — Encounter: Payer: Self-pay | Admitting: Internal Medicine

## 2018-02-22 ENCOUNTER — Emergency Department: Payer: PPO

## 2018-02-22 DIAGNOSIS — Z951 Presence of aortocoronary bypass graft: Secondary | ICD-10-CM | POA: Diagnosis not present

## 2018-02-22 DIAGNOSIS — E1151 Type 2 diabetes mellitus with diabetic peripheral angiopathy without gangrene: Secondary | ICD-10-CM | POA: Diagnosis present

## 2018-02-22 DIAGNOSIS — Z7984 Long term (current) use of oral hypoglycemic drugs: Secondary | ICD-10-CM

## 2018-02-22 DIAGNOSIS — W19XXXA Unspecified fall, initial encounter: Secondary | ICD-10-CM | POA: Diagnosis not present

## 2018-02-22 DIAGNOSIS — Z7951 Long term (current) use of inhaled steroids: Secondary | ICD-10-CM

## 2018-02-22 DIAGNOSIS — Z888 Allergy status to other drugs, medicaments and biological substances status: Secondary | ICD-10-CM

## 2018-02-22 DIAGNOSIS — Z882 Allergy status to sulfonamides status: Secondary | ICD-10-CM

## 2018-02-22 DIAGNOSIS — Z885 Allergy status to narcotic agent status: Secondary | ICD-10-CM

## 2018-02-22 DIAGNOSIS — I63432 Cerebral infarction due to embolism of left posterior cerebral artery: Principal | ICD-10-CM | POA: Diagnosis present

## 2018-02-22 DIAGNOSIS — I6523 Occlusion and stenosis of bilateral carotid arteries: Secondary | ICD-10-CM | POA: Diagnosis not present

## 2018-02-22 DIAGNOSIS — G7000 Myasthenia gravis without (acute) exacerbation: Secondary | ICD-10-CM | POA: Diagnosis not present

## 2018-02-22 DIAGNOSIS — H53461 Homonymous bilateral field defects, right side: Secondary | ICD-10-CM | POA: Diagnosis present

## 2018-02-22 DIAGNOSIS — Z881 Allergy status to other antibiotic agents status: Secondary | ICD-10-CM | POA: Diagnosis not present

## 2018-02-22 DIAGNOSIS — D6859 Other primary thrombophilia: Secondary | ICD-10-CM | POA: Diagnosis not present

## 2018-02-22 DIAGNOSIS — R29705 NIHSS score 5: Secondary | ICD-10-CM | POA: Diagnosis not present

## 2018-02-22 DIAGNOSIS — R4182 Altered mental status, unspecified: Secondary | ICD-10-CM | POA: Diagnosis not present

## 2018-02-22 DIAGNOSIS — I5030 Unspecified diastolic (congestive) heart failure: Secondary | ICD-10-CM | POA: Diagnosis not present

## 2018-02-22 DIAGNOSIS — I10 Essential (primary) hypertension: Secondary | ICD-10-CM | POA: Diagnosis present

## 2018-02-22 DIAGNOSIS — R531 Weakness: Secondary | ICD-10-CM | POA: Diagnosis not present

## 2018-02-22 DIAGNOSIS — G9340 Encephalopathy, unspecified: Secondary | ICD-10-CM | POA: Diagnosis not present

## 2018-02-22 DIAGNOSIS — I480 Paroxysmal atrial fibrillation: Secondary | ICD-10-CM | POA: Diagnosis not present

## 2018-02-22 DIAGNOSIS — M1991 Primary osteoarthritis, unspecified site: Secondary | ICD-10-CM | POA: Diagnosis present

## 2018-02-22 DIAGNOSIS — E119 Type 2 diabetes mellitus without complications: Secondary | ICD-10-CM | POA: Diagnosis not present

## 2018-02-22 DIAGNOSIS — I2510 Atherosclerotic heart disease of native coronary artery without angina pectoris: Secondary | ICD-10-CM | POA: Diagnosis present

## 2018-02-22 DIAGNOSIS — E785 Hyperlipidemia, unspecified: Secondary | ICD-10-CM | POA: Diagnosis present

## 2018-02-22 DIAGNOSIS — I639 Cerebral infarction, unspecified: Secondary | ICD-10-CM | POA: Diagnosis not present

## 2018-02-22 DIAGNOSIS — R29818 Other symptoms and signs involving the nervous system: Secondary | ICD-10-CM | POA: Diagnosis not present

## 2018-02-22 DIAGNOSIS — I252 Old myocardial infarction: Secondary | ICD-10-CM | POA: Diagnosis not present

## 2018-02-22 DIAGNOSIS — E7800 Pure hypercholesterolemia, unspecified: Secondary | ICD-10-CM | POA: Diagnosis not present

## 2018-02-22 DIAGNOSIS — R4189 Other symptoms and signs involving cognitive functions and awareness: Secondary | ICD-10-CM | POA: Diagnosis present

## 2018-02-22 DIAGNOSIS — H409 Unspecified glaucoma: Secondary | ICD-10-CM | POA: Diagnosis not present

## 2018-02-22 DIAGNOSIS — Z79899 Other long term (current) drug therapy: Secondary | ICD-10-CM | POA: Diagnosis not present

## 2018-02-22 DIAGNOSIS — R404 Transient alteration of awareness: Secondary | ICD-10-CM | POA: Diagnosis present

## 2018-02-22 DIAGNOSIS — Z9861 Coronary angioplasty status: Secondary | ICD-10-CM

## 2018-02-22 DIAGNOSIS — R410 Disorientation, unspecified: Secondary | ICD-10-CM | POA: Diagnosis not present

## 2018-02-22 DIAGNOSIS — G4730 Sleep apnea, unspecified: Secondary | ICD-10-CM | POA: Diagnosis present

## 2018-02-22 DIAGNOSIS — E118 Type 2 diabetes mellitus with unspecified complications: Secondary | ICD-10-CM | POA: Diagnosis present

## 2018-02-22 DIAGNOSIS — R4020 Unspecified coma: Secondary | ICD-10-CM | POA: Diagnosis not present

## 2018-02-22 DIAGNOSIS — Z8673 Personal history of transient ischemic attack (TIA), and cerebral infarction without residual deficits: Secondary | ICD-10-CM | POA: Diagnosis present

## 2018-02-22 LAB — COMPREHENSIVE METABOLIC PANEL
ALBUMIN: 3.6 g/dL (ref 3.5–5.0)
ALT: 36 U/L (ref 17–63)
ANION GAP: 8 (ref 5–15)
AST: 37 U/L (ref 15–41)
Alkaline Phosphatase: 69 U/L (ref 38–126)
BUN: 18 mg/dL (ref 6–20)
CALCIUM: 8.6 mg/dL — AB (ref 8.9–10.3)
CO2: 24 mmol/L (ref 22–32)
Chloride: 106 mmol/L (ref 101–111)
Creatinine, Ser: 0.95 mg/dL (ref 0.61–1.24)
GFR calc non Af Amer: >60 mL/min (ref >60)
Glucose, Bld: 147 mg/dL — ABNORMAL HIGH (ref 65–99)
POTASSIUM: 3.8 mmol/L (ref 3.5–5.1)
Sodium: 138 mmol/L (ref 135–145)
Total Bilirubin: 0.8 mg/dL (ref 0.3–1.2)
Total Protein: 6.3 g/dL — ABNORMAL LOW (ref 6.5–8.1)

## 2018-02-22 LAB — GLUCOSE, CAPILLARY: GLUCOSE-CAPILLARY: 152 mg/dL — AB (ref 65–99)

## 2018-02-22 LAB — CBC
HCT: 40.2 % (ref 40.0–52.0)
HEMOGLOBIN: 14.1 g/dL (ref 13.0–18.0)
MCH: 33.5 pg (ref 26.0–34.0)
MCHC: 35.1 g/dL (ref 32.0–36.0)
MCV: 95.3 fL (ref 80.0–100.0)
PLATELETS: 260 10*3/uL (ref 150–440)
RBC: 4.22 MIL/uL — AB (ref 4.40–5.90)
RDW: 13.2 % (ref 11.5–14.5)
WBC: 5.6 10*3/uL (ref 3.8–10.6)

## 2018-02-22 LAB — DIFFERENTIAL
Basophils Absolute: 0 10*3/uL (ref 0–0.1)
Basophils Relative: 1 %
EOS ABS: 0 10*3/uL (ref 0–0.7)
EOS PCT: 0 %
LYMPHS ABS: 1.6 10*3/uL (ref 1.0–3.6)
Lymphocytes Relative: 28 %
MONO ABS: 0.6 10*3/uL (ref 0.2–1.0)
Monocytes Relative: 11 %
NEUTROS PCT: 60 %
Neutro Abs: 3.4 10*3/uL (ref 1.4–6.5)

## 2018-02-22 LAB — PROTIME-INR
INR: 1.12
PROTHROMBIN TIME: 14.3 s (ref 11.4–15.2)

## 2018-02-22 LAB — APTT: aPTT: 32 seconds (ref 24–36)

## 2018-02-22 LAB — TROPONIN I

## 2018-02-22 NOTE — ED Notes (Signed)
Per pt's spouse, pt has not been able to see out of right eye "for awhile". Pt's spouse states last known well time at 1600.

## 2018-02-22 NOTE — ED Notes (Signed)
hospitalist in to see pt.

## 2018-02-22 NOTE — H&P (Signed)
[facility] NAME: Kyle Richardson    MR#:  [id number]  DATE OF BIRTH:  1928-08-02  DATE OF ADMISSION:  02/22/2018  PRIMARY CARE PHYSICIAN: Einar Pheasant, MD   REQUESTING/REFERRING PHYSICIAN: Burlene Arnt, MD  CHIEF COMPLAINT:   Chief Complaint  Patient presents with  . Code Stroke    HISTORY OF PRESENT ILLNESS:  Kyle Richardson  is a 84 y.o. male who presents after being found unresponsive outside by family.  When patient came to he stated that he had a visual disturbance, that he could not see on the right side.  Patient is also confused.  Here in the ED he maintains that he has a right field cut, and code stroke was called with hospitalist called for admission.  PAST MEDICAL HISTORY:   Past Medical History:  Diagnosis Date  . 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  s/p PTCA of the mid RCA 1/96, also  s/p  CABG x 4 7/96  . Degenerative disc disease   . Diabetes mellitus ([place])   . Glaucoma   . Hypercholesterolemia   . Hypertension   . Migraine headache    history of  . Osteoarthritis   . Sleep apnea      PAST SURGICAL HISTORY:   Past Surgical History:  Procedure Laterality Date  . APPENDECTOMY    . CHOLECYSTECTOMY     Removed  . CORONARY ARTERY BYPASS GRAFT  7/96   x4  . HEMORRHOID SURGERY    . HERNIA REPAIR    . UPP and tonsillectomy  1/86     SOCIAL HISTORY:   Social History   Tobacco Use  . Smoking status: Never Smoker  . Smokeless tobacco: Never Used  Substance Use Topics  . Alcohol use: No    Alcohol/week: 0.0 oz     FAMILY HISTORY:   Family History  Problem Relation Age of Onset  . Heart disease Mother        died age 22  . Heart disease Father        and other vascular disease  . Diabetes Unknown        four siblings  . Heart disease Brother        s/p CABG  . Colon cancer Neg Hx   . Prostate cancer Neg Hx      DRUG ALLERGIES:    Allergies  Allergen Reactions  . Clarithromycin Other (See Comments) and Diarrhea  . Codeine   . Flomax [Tamsulosin Hcl]   . Lamisil [Terbinafine]   . Levofloxacin Other (See Comments)    diplopia  . Sertraline Other (See Comments)  . Sulfa Antibiotics     MEDICATIONS AT HOME:   Prior to Admission medications   Medication Sig Start Date End Date Taking? Authorizing Provider  ACCU-CHEK SOFTCLIX LANCETS lancets USE AS DIRECTED 02/26/14  Yes Einar Pheasant, MD  apixaban (ELIQUIS) 5 MG TABS tablet Take 5 mg by mouth 2 (two) times daily.    Yes [provider]  atorvastatin (LIPITOR) 20 MG tablet TAKE 1 TABLET BY MOUTH EVERY DAY 01/18/18  Yes Einar Pheasant, MD  Calcium Citrate-Vitamin D 315-250 MG-UNIT TABS Take 2 tablets by mouth daily.   Yes [provider]  FLUoxetine (PROZAC) 10 MG capsule TAKE 1 CAPSULE BY MOUTH EVERY DAY 12/26/17  Yes Einar Pheasant, MD  fluticasone (FLONASE) 50 MCG/ACT nasal spray SPRAY 2 SPRAYS IN EACH NOSTRIL ONCE A  DAY 02/20/18  Yes Einar Pheasant, MD  latanoprost (XALATAN) 0.005 % ophthalmic solution Place 1 drop into both eyes at bedtime.   Yes [provider]  loratadine (CLARITIN) 10 MG tablet Take 10 mg by mouth daily.   Yes [provider]  metoprolol succinate (TOPROL-XL) 25 MG 24 hr tablet Take 25 mg by mouth daily. Take with or immediately following a meal.    Yes [provider]  Multiple Vitamin (MULTIVITAMIN) tablet Take 1 tablet by mouth daily.   Yes [provider]  Multiple Vitamins-Minerals (PRESERVISION AREDS 2) CAPS Take 1 tablet by mouth 2 (two) times daily.    Yes [provider]  nitroGLYCERIN (NITROSTAT) 0.4 MG SL tablet Place 0.4 mg under the tongue every 5 (five) minutes as needed. As directed   Yes [provider]  ONE TOUCH ULTRA TEST test strip CHECK BLOOD SUGAR TWICE A DAY 01/24/18  Yes Einar Pheasant, MD  pyridostigmine (MESTINON) 60 MG tablet Take 1 tablet by  mouth 3 (three) times daily. 02/21/18   [provider]    REVIEW OF SYSTEMS:  Review of Systems  Unable to perform ROS: Acuity of condition     VITAL SIGNS:   Vitals:   02/22/18 1958 02/22/18 2000 02/22/18 2030 02/22/18 2210  BP:  (!) 177/109 (!) 174/75 (!) 194/80  Pulse:  62 63 (!) 56  Resp:  14  16  Temp: 97.8 F (36.6 C)     TempSrc: Axillary     SpO2:  98% 100% 95%  Weight:      Height:       Wt Readings from Last 3 Encounters:  02/22/18 79.4 kg (175 lb)  02/19/18 78.6 kg (173 lb 6 oz)  01/28/18 81.4 kg (179 lb 6.4 oz)    PHYSICAL EXAMINATION:  Physical Exam  Vitals reviewed. Constitutional: He appears well-developed and well-nourished. No distress.  HENT:  Head: Normocephalic and atraumatic.  Mouth/Throat: Oropharynx is clear and moist.  Eyes: Pupils are equal, round, and reactive to light. Conjunctivae and EOM are normal. No scleral icterus.  Neck: Normal range of motion. Neck supple. No JVD present. No thyromegaly present.  Cardiovascular: Normal rate and intact distal pulses. Exam reveals no gallop and no friction rub.  No murmur heard. Irregular rhythm  Respiratory: Effort normal and breath sounds normal. No respiratory distress. He has no wheezes. He has no rales.  GI: Soft. Bowel sounds are normal. He exhibits no distension. There is no tenderness.  Musculoskeletal: Normal range of motion. He exhibits no edema.  No arthritis, no gout  Lymphadenopathy:    He has no cervical adenopathy.  Neurological: He is alert. No cranial nerve deficit.  Neurologic: Cranial nerves II-XII intact, right field cut present, sensation intact to light touch/pinprick, 5/5 strength in all extremities except for right upper extremity which has 4-/5 strength, no dysarthria, no aphasia, no dysphagia  Skin: Skin is warm and dry. No rash noted. No erythema.    LABORATORY PANEL:   CBC Recent Labs  Lab 02/22/18 1950  WBC 5.6  HGB 14.1  HCT 40.2  PLT 260    ------------------------------------------------------------------------------------------------------------------  Chemistries  Recent Labs  Lab 02/22/18 1950  NA 138  K 3.8  CL 106  CO2 24  GLUCOSE 147*  BUN 18  CREATININE 0.95  CALCIUM 8.6*  AST 37  ALT 36  ALKPHOS 69  BILITOT 0.8   ------------------------------------------------------------------------------------------------------------------  Cardiac Enzymes Recent Labs  Lab 02/22/18 1950  TROPONINI <0.03   ------------------------------------------------------------------------------------------------------------------  RADIOLOGY:  Ct Head Code Stroke Wo Contrast  Result Date: 02/22/2018 CLINICAL DATA:  Code stroke. RIGHT visual loss, RIGHT-sided weakness. On Eliquis. History of hypertension, migraine, hypercholesterolemia, diabetes. EXAM: CT HEAD WITHOUT CONTRAST TECHNIQUE: Contiguous axial images were obtained from the base of the skull through the vertex without intravenous contrast. COMPARISON:  MRI of the head January 04, 2017 FINDINGS: BRAIN: No intraparenchymal hemorrhage, mass effect nor midline shift. The ventricles and sulci are normal for age. Patchy supratentorial white matter hypodensities less than expected for patient's age, though non-specific are most compatible with chronic small vessel ischemic disease. No acute large vascular territory infarcts. No abnormal extra-axial fluid collections. Basal cisterns are patent. VASCULAR: Moderate calcific atherosclerosis of the carotid siphons. SKULL: No skull fracture. No significant scalp soft tissue swelling. SINUSES/ORBITS: Minimal paranasal sinus mucosal thickening. Mastoid air cells are well aerated.The included ocular globes and orbital contents are non-suspicious. OTHER: None. ASPECTS [facility] Stroke Program Early CT Score) - Ganglionic level infarction (caudate, lentiform nuclei, internal capsule, insula, M1-M3 cortex): 7 - Supraganglionic infarction (M4-M6  cortex): 3 Total score (0-10 with 10 being normal): 10 IMPRESSION: 1. Negative noncontrast CT HEAD for age. 2. ASPECTS is 10. 3. Critical Value/emergent results were called by telephone at the time of interpretation on 02/22/2018 at 7:45 pm to Dr. Charlotte Crumb , who verbally acknowledged these results. Electronically Signed   By: Elon Alas M.D.   On: 02/22/2018 19:46    EKG:   Orders placed or performed during the hospital encounter of 02/22/18  . ED EKG  . ED EKG  . EKG 12-Lead  . EKG 12-Lead  . EKG 12-Lead  . EKG 12-Lead    IMPRESSION AND PLAN:  Principal Problem:   Unresponsive episode -strong suspicion for stroke, see below Active Problems:   Right homonymous hemianopsia -as well as some right upper extremity weakness, admit per stroke admission order set with appropriate imaging, labs, consults   Type 2 diabetes mellitus with complication, without long-term current use of insulin (HCC) -sliding scale insulin with corresponding glucose checks   Hypertension -permissive hypertension for 24 hours, blood pressure goal less than 220/120, hold antihypertensives for now   CAD (coronary artery disease) -continue home meds   PAF (paroxysmal atrial fibrillation) ([place]) -home rate controlling medications and anticoagulation   Hypercholesterolemia -home dose statin  Chart review performed and case discussed with ED provider. Labs, imaging and/or ECG reviewed by provider and discussed with patient/family. Management plans discussed with the patient and/or family.  DVT PROPHYLAXIS: Systemic anticoagulation  GI PROPHYLAXIS: None  ADMISSION STATUS: Observation  CODE STATUS: Full  TOTAL TIME TAKING CARE OF THIS PATIENT: 40 minutes.   Kyle Richardson 02/22/2018, 10:16 PM  [facility]  Office  [phone]  CC: Primary care physician; Einar Pheasant, MD  Note:  This document was prepared using Dragon voice recognition software and may include  unintentional dictation errors.

## 2018-02-22 NOTE — ED Notes (Signed)
Spoke with dr. Jannifer Franklin regarding hypertension, no new orders received.

## 2018-02-22 NOTE — ED Triage Notes (Signed)
Pt arrives from home with right sided eye visual loss and right sided weakness. Unknown last well time. Pt is on eliquis. md at bedside, pt to ct with rn.

## 2018-02-22 NOTE — ED Notes (Signed)
Pt to floor with listed in chart belongings: bilateral hearing aids, clothing, and apple watch.

## 2018-02-22 NOTE — Consult Note (Addendum)
TeleNeurology Consult Note  Kyle Richardson     Consulting Physician: Clabe Seal Code Status: No Order Primary Care Physician:  Einar Pheasant, MD  DOB:  Feb 24, 1928   Age: 84 y.o.  Date of Service: Feb 22, 2018 Admit Date:  02/22/2018   Admitting Diagnosis: <principal problem not specified>  Subjective:  Reason for Consultation:stroke  Chief Complaint: vision complaints  History of present illness: 84 y/o man presents with right visual loss since April 2018. Patient is poor historian. No family at bedside. He just was seen at [facility] neurology yesterday for these complaints. There is concern for possible mysathenia gravis.  Emergent telestroke consult requested. CT head reviewed and case discussed with ED staff. He is on Elliquis for atrial fibrillation  Review of Systems  Patient Active Problem List   Diagnosis Date Noted  . UTI (urinary tract infection) 01/28/2018  . Abnormal chest CT 09/20/2017  . Double vision 01/21/2017  . Ascending aortic aneurysm ([place]) 12/22/2016  . Blood-tinged sputum 10/23/2016  . Cough 10/23/2016  . A-fib ([place]) 09/14/2016  . Health care maintenance 12/26/2014  . Stress 07/05/2014  . Migraine 05/10/2013  . Type 2 diabetes mellitus with complication, without long-term current use of insulin ([place]) 08/20/2012  . Hypertension 08/20/2012  . Hypercholesterolemia 08/20/2012  . CAD (coronary artery disease) 08/20/2012   Past Medical History:  Diagnosis Date  . 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  s/p PTCA of the mid RCA 1/96, also  s/p  CABG x 4 7/96  . Degenerative disc disease   . Diabetes mellitus ([place])   . Glaucoma   . Hypercholesterolemia   . Hypertension   . Migraine headache    history of  . Osteoarthritis   . Sleep apnea    Past Surgical History:  Procedure Laterality Date  . APPENDECTOMY    . CHOLECYSTECTOMY     Removed  . CORONARY ARTERY BYPASS GRAFT  7/96   x4  . HEMORRHOID SURGERY    .  HERNIA REPAIR    . UPP and tonsillectomy  1/86   Allergies  Allergen Reactions  . Clarithromycin Other (See Comments) and Diarrhea  . Codeine   . Flomax [Tamsulosin Hcl]   . Lamisil [Terbinafine]   . Levofloxacin Other (See Comments)    diplopia  . Sertraline Other (See Comments)  . Sulfa Antibiotics     Social History   Socioeconomic History  . Marital status: Married    Spouse name: Not on file  . Number of children: 2  . Years of education: Not on file  . Highest education level: Not on file  Occupational History  . Not on file  Social Needs  . Financial resource strain: Not hard at all  . Food insecurity:    Worry: Never true    Inability: Never true  . Transportation needs:    Medical: No    Non-medical: No  Tobacco Use  . Smoking status: Never Smoker  . Smokeless tobacco: Never Used  Substance and Sexual Activity  . Alcohol use: No    Alcohol/week: 0.0 oz  . Drug use: No  . Sexual activity: Not Currently  Lifestyle  . Physical activity:    Days per week: Not on file    Minutes per session: Not on file  . Stress: Not on file  Relationships  . Social connections:    Talks on phone: Patient refused    Gets together: Patient refused  Attends religious service: Patient refused    Active member of club or organization: Patient refused    Attends meetings of clubs or organizations: Patient refused    Relationship status: Patient refused  . Intimate partner violence:    Fear of current or ex partner: Patient refused    Emotionally abused: Patient refused    Physically abused: Patient refused    Forced sexual activity: Patient refused  Other Topics Concern  . Not on file  Social History Narrative  . Not on file   Family History Family History  Problem Relation Age of Onset  . Heart disease Mother        died age 3  . Heart disease Father        and other vascular disease  . Diabetes Unknown        four siblings  . Heart disease Brother         s/p CABG  . Colon cancer Neg Hx   . Prostate cancer Neg Hx       Objective:  Vital signs in last 24 hours: Temp:  [97.8 F (36.6 C)] 97.8 F (36.6 C) (05/17 1958) Pulse Rate:  [62-68] 63 (05/17 2030) Resp:  [13-16] 14 (05/17 2000) BP: (174-181)/(69-109) 174/75 (05/17 2030) SpO2:  [93 %-100 %] 100 % (05/17 2030) Weight:  [79.4 kg (175 lb)] 79.4 kg (175 lb) (05/17 1949) Temp (24hrs), Avg:97.8 F (36.6 C), Min:97.8 F (36.6 C), Max:97.8 F (36.6 C)    Intake/Output last 3 shifts: No intake/output data recorded.  Intake/Output this shift: No intake/output data recorded.  Physical Exam 1a- LOC: Keenly responsive - 0      1b- LOC questions: Answers both questions correctly - 0     1c- LOC commands- Performs both tasks correctly- 0     2- Gaze: Normal; no gaze paresis or gaze deviation - 0     3- Visual Fields: Right heminaopsia - 2     4- Facial movements: no facial palsy - 0     5a- Right Upper limb motor - no drift -0     5b -Left Upper limb motor - no drift -0     6a- Right Lower limb motor - no drift - 0      6b- Left Lower limb motor - no drift - 0 7- Limb Coordination: absent ataxia - 0      8- Sensory : no sensory loss - 0      9- Language - No aphasia - 0      10- Speech - No dysarthria -0     11- Neglect / Extinction - right hemineglect -2    NIHSS score   4  Diagnostic Findings:  Pertinent Labs:  Recent Labs  Lab 02/22/18 1950  WBC 5.6  MCV 95.3   Recent Labs  Lab 02/22/18 1950  CALCIUM 8.6*  BUN 18  GLUCOSE 147*  CREATININE 0.95  CO2 24   Recent Labs  Lab 02/22/18 1950  AST 37  ALT 36  ALBUMIN 3.6   No results for input(s): TRIG in the last 168 hours.  Invalid input(s): CHLPL, HDL PML, VLDL CALC, LDL CALC, CHOL/HDL RATIO, LDL/HDL RATIO, NON-HDL CHOLESTEROL Invalid input(s): CK TOTAL, TROPONIN I, CK MB Recent Labs  Lab 02/22/18 1950  APTT 32   Recent Labs  Lab 02/22/18 1950  INR 1.12    Extensive review of previous medical  records completed.    Assessment: Patient Active Problem List   Diagnosis  Date Noted  . UTI (urinary tract infection) 01/28/2018  . Abnormal chest CT 09/20/2017  . Double vision 01/21/2017  . Ascending aortic aneurysm ([place]) 12/22/2016  . Blood-tinged sputum 10/23/2016  . Cough 10/23/2016  . A-fib ([place]) 09/14/2016  . Health care maintenance 12/26/2014  . Stress 07/05/2014  . Migraine 05/10/2013  . Type 2 diabetes mellitus with complication, without long-term current use of insulin ([place]) 08/20/2012  . Hypertension 08/20/2012  . Hypercholesterolemia 08/20/2012  . CAD (coronary artery disease) 08/20/2012     Plan: [facility] TeleNeurology Consult Services  Impression: Right Hemianopsia and Neglect    Differential Diagnosis:  1. Cardioembolic stroke  2. Small vessel disease/lacune    3. Thromboembolic, artery-to-artery mechanism 4. Hypercoagulable state-related infarct  5. Thrombotic mechanism, large artery disease 6. Transient ischemic attack  Comments: Last known well:   April 2018 Door time:1923 [facility] contacted:   2038 [facility] at bedside:   2042 NIHSS assessment time:2042 Needle time:TPA not considered since he is out of the TPA window. Also on Elliquis Thrombectomy not considered since symptoms > 24 hours     Discussion:     Our recommendations are outlined below.  Recommendations: ASA Neurochecks DVT prophylaxis    Follow up with Neurology for further testing and evaluation No driving  Medical Decision Making:  - Extensive number of diagnosis or management options are considered above. - Extensive amount of complex data reviewed.  - High risk of complication and/or morbidity or mortality are associated with differential diagnostic considerations above. - There may be uncertain outcome and increased probability of prolonged functional impairment or high probability of severe prolonged functional impairment associated with some of these  differential diagnosis.  Medical Data Reviewed: 1.Data reviewed include clinical labs, radiology, Medical Tests; 2.Tests results discussed w/performing or interpreting physician;    3.Obtaining/reviewing old medical records; 4.Obtaining case history from another source;    5.Independent review of image, tracing or specimen.  Signed: @MECRED @   02/22/2018  8:44 PM

## 2018-02-22 NOTE — ED Notes (Signed)
Tele neurology on screen

## 2018-02-22 NOTE — ED Notes (Signed)
Returned from ct 

## 2018-02-22 NOTE — Progress Notes (Signed)
CODE STROKE- PHARMACY COMMUNICATION   Time CODE STROKE called/page received:19:35  Time response to CODE STROKE was made (in person or via phone):   Time Stroke Kit retrieved from [place] (only if needed):  Name of Provider/Nurse contacted:April's phone (unidentified)  Past Medical History:  Diagnosis Date  . 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  s/p PTCA of the mid RCA 1/96, also  s/p  CABG x 4 7/96  . Degenerative disc disease   . Diabetes mellitus ([place])   . Glaucoma   . Hypercholesterolemia   . Hypertension   . Migraine headache    history of  . Osteoarthritis   . Sleep apnea    Prior to Admission medications   Medication Sig Start Date End Date Taking? Authorizing Provider  ACCU-CHEK SOFTCLIX LANCETS lancets USE AS DIRECTED 02/26/14   Einar Pheasant, MD  apixaban (ELIQUIS) 5 MG TABS tablet Take by mouth. 08/15/16   [provider]  atorvastatin (LIPITOR) 20 MG tablet TAKE 1 TABLET BY MOUTH EVERY DAY 01/18/18   Einar Pheasant, MD  calcium citrate-vitamin D (CITRACAL+D) 315-200 MG-UNIT per tablet Take 1 tablet by mouth daily.    [provider]  FLUoxetine (PROZAC) 10 MG capsule TAKE 1 CAPSULE BY MOUTH EVERY DAY 12/26/17   Einar Pheasant, MD  fluticasone [facility]) 50 MCG/ACT nasal spray SPRAY 2 SPRAYS IN EACH NOSTRIL ONCE A DAY 02/20/18   Einar Pheasant, MD  latanoprost (XALATAN) 0.005 % ophthalmic solution Place 1 drop into both eyes at bedtime.    [provider]  loratadine (CLARITIN) 10 MG tablet Take 10 mg by mouth daily.    [provider]  metoprolol succinate (TOPROL-XL) 50 MG 24 hr tablet Take 50 mg by mouth daily. Take with or immediately following a meal.    [provider]  Multiple Vitamin (MULTIVITAMIN) tablet Take 1 tablet by mouth daily.    [provider]  Multiple Vitamins-Minerals (PRESERVISION AREDS 2) CAPS Take by mouth daily.    [provider]  mupirocin  ointment (BACTROBAN) 2 % Apply to affected areas bid 04/05/15   Einar Pheasant, MD  nitroGLYCERIN (NITROSTAT) 0.4 MG SL tablet Place 0.4 mg under the tongue every 5 (five) minutes as needed. As directed    [provider]  ONE TOUCH ULTRA TEST test strip CHECK BLOOD SUGAR TWICE A DAY 01/24/18   Einar Pheasant, MD  pantoprazole (PROTONIX) 40 MG tablet Take 40 mg by mouth daily.    [provider]    Laural Benes ,PharmD Clinical Pharmacist  02/22/2018  7:35 PM

## 2018-02-22 NOTE — Progress Notes (Signed)
   02/22/18 1900  Clinical Encounter Type  Visited With Family  Visit Type Initial;Code;ED  Referral From Nurse  Spiritual Encounters  Spiritual Needs Emotional

## 2018-02-22 NOTE — ED Triage Notes (Signed)
fsbs with ems 114.

## 2018-02-22 NOTE — ED Provider Notes (Addendum)
[facility] Emergency Department Provider Note  ____________________________________________   I have reviewed the triage vital signs and the nursing notes. Where available I have reviewed prior notes and, if possible and indicated, outside hospital notes.    HISTORY  Chief Complaint No chief complaint on file.    HPI Kyle Richardson is a 84 y.o. male  Presents today with some confusion, is unclear exactly how long this is been going on.  We do not have a reliable timeline but is estimated possibly an hour and a half ago he may have been normal, he has a son with MR according to reports and therefore it is unclear and he himself cannot tell us.  In any event, patient comes emergency room after being found down in the garden.  He fell.  He is complaining of loss of vision in that right eye principally.  He does not have a clear recollection of what happened.  He does take Eliquis according to notes.  He denies any chest pain or shortness of breath.  He is not having any pain he states.  EMS stated that he had some right-sided weakness but his confusion seems to be improving as time goes on. Unclear if he has a history of stroke in the past nothing I see clearly documents that.  He did have a MRI for diplopia about 1 year ago, which was normal.  He himself is aware of his name is unsure of the date and he thinks he is in the hospital but he cannot really tell me what else happened.  ----------------------------------------- 7:37 PM on 02/22/2018 -----------------------------------------  Per EMS, initial fire dispatcher found the patient to be minimally responsive and is rapidly progressing in terms of improvement since that time.  There is no clear time of onset however.  Level 5 chart caveat; no further history available due to patient status.  Past Medical History:  Diagnosis Date  . 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  s/p  PTCA of the mid RCA 1/96, also  s/p  CABG x 4 7/96  . Degenerative disc disease   . Diabetes mellitus ([place])   . Glaucoma   . Hypercholesterolemia   . Hypertension   . Migraine headache    history of  . Osteoarthritis   . Sleep apnea     Patient Active Problem List   Diagnosis Date Noted  . UTI (urinary tract infection) 01/28/2018  . Abnormal chest CT 09/20/2017  . Double vision 01/21/2017  . Ascending aortic aneurysm ([place]) 12/22/2016  . Blood-tinged sputum 10/23/2016  . Cough 10/23/2016  . A-fib ([place]) 09/14/2016  . Health care maintenance 12/26/2014  . Stress 07/05/2014  . Migraine 05/10/2013  . Type 2 diabetes mellitus with complication, without long-term current use of insulin ([place]) 08/20/2012  . Hypertension 08/20/2012  . Hypercholesterolemia 08/20/2012  . CAD (coronary artery disease) 08/20/2012    Past Surgical History:  Procedure Laterality Date  . APPENDECTOMY    . CHOLECYSTECTOMY     Removed  . CORONARY ARTERY BYPASS GRAFT  7/96   x4  . HEMORRHOID SURGERY    . HERNIA REPAIR    . UPP and tonsillectomy  1/86    Prior to Admission medications   Medication Sig Start Date End Date Taking? Authorizing Provider  ACCU-CHEK SOFTCLIX LANCETS lancets USE AS DIRECTED 02/26/14   Einar Pheasant, MD  apixaban (ELIQUIS) 5 MG TABS tablet Take by mouth. 08/15/16  [provider]  atorvastatin (LIPITOR) 20 MG tablet TAKE 1 TABLET BY MOUTH EVERY DAY 01/18/18   Einar Pheasant, MD  calcium citrate-vitamin D (CITRACAL+D) 315-200 MG-UNIT per tablet Take 1 tablet by mouth daily.    [provider]  FLUoxetine (PROZAC) 10 MG capsule TAKE 1 CAPSULE BY MOUTH EVERY DAY 12/26/17   Einar Pheasant, MD  fluticasone [facility]) 50 MCG/ACT nasal spray SPRAY 2 SPRAYS IN EACH NOSTRIL ONCE A DAY 02/20/18   Einar Pheasant, MD  latanoprost (XALATAN) 0.005 % ophthalmic solution Place 1 drop into both eyes at bedtime.    [provider]  loratadine (CLARITIN) 10 MG tablet  Take 10 mg by mouth daily.    [provider]  metoprolol succinate (TOPROL-XL) 50 MG 24 hr tablet Take 50 mg by mouth daily. Take with or immediately following a meal.    [provider]  Multiple Vitamin (MULTIVITAMIN) tablet Take 1 tablet by mouth daily.    [provider]  Multiple Vitamins-Minerals (PRESERVISION AREDS 2) CAPS Take by mouth daily.    [provider]  mupirocin ointment (BACTROBAN) 2 % Apply to affected areas bid 04/05/15   Einar Pheasant, MD  nitroGLYCERIN (NITROSTAT) 0.4 MG SL tablet Place 0.4 mg under the tongue every 5 (five) minutes as needed. As directed    [provider]  ONE TOUCH ULTRA TEST test strip CHECK BLOOD SUGAR TWICE A DAY 01/24/18   Einar Pheasant, MD  pantoprazole (PROTONIX) 40 MG tablet Take 40 mg by mouth daily.    [provider]    Allergies Clarithromycin; Codeine; Flomax [tamsulosin hcl]; Lamisil [terbinafine]; Levofloxacin; Sertraline; and Sulfa antibiotics  Family History  Problem Relation Age of Onset  . Heart disease Mother        died age 68  . Heart disease Father        and other vascular disease  . Diabetes Unknown        four siblings  . Heart disease Brother        s/p CABG  . Colon cancer Neg Hx   . Prostate cancer Neg Hx     Social History Social History   Tobacco Use  . Smoking status: Never Smoker  . Smokeless tobacco: Never Used  Substance Use Topics  . Alcohol use: No    Alcohol/week: 0.0 oz  . Drug use: No    Review of Systems Constitutional: No fever/chills Eyes: No visual changes. ENT: No sore throat. No stiff neck no neck pain Cardiovascular: Denies chest pain. Respiratory: Denies shortness of breath. Gastrointestinal:   no vomiting.  No diarrhea.  No constipation. Genitourinary: Negative for dysuria. Musculoskeletal: Negative lower extremity swelling Skin: Negative for rash. Neurological: Negative for severe headaches, focal weakness or numbness.   However, very limited history,   ____________________________________________   PHYSICAL EXAM:  VITAL SIGNS: ED Triage Vitals  Enc Vitals Group     BP      Pulse      Resp      Temp      Temp src      SpO2      Weight      Height      Head Circumference      Peak Flow      Pain Score      Pain Loc      Pain Edu?      Excl. in [place]?     Constitutional: Alert and oriented to name and place unsure of  the date or the events immediately prior to his arrival here Eyes: Conjunctivae are normal Head: Atraumatic HEENT: No congestion/rhinnorhea. Mucous membranes are moist.  Oropharynx non-erythematous Neck:   Nontender with no meningismus, no masses, no stridor Cardiovascular: Normal rate, regular rhythm. Grossly normal heart sounds.  Good peripheral circulation. Respiratory: Normal respiratory effort.  No retractions. Lungs CTAB. Abdominal: Soft and nontender. No distention. No guarding no rebound Back:  There is no focal tenderness or step off.  there is no midline tenderness there are no lesions noted. there is no CVA tenderness Musculoskeletal: No lower extremity tenderness, no upper extremity tenderness. No joint effusions, no DVT signs strong distal pulses no edema Neurologic: Somewhat limited neurologic exam, patient has decreased vision to confrontation in the right eye, vision appears to be intact in the left eye, patient has some trouble with his exam, seems to track okay with both eyes.  He has to my exam symmetric strength bilateral upper and lower extremity with no obvious loss of sensation, speech appears to be normal but he is somewhat confused.  Limited exam gives me an NIH stroke scale of at least 3, Skin:  Skin is warm, dry and intact. No rash noted. Psychiatric: Mood and affect are normal. Speech and behavior are normal.  ____________________________________________   LABS (all labs ordered are listed, but only abnormal results are displayed)  Labs Reviewed   PROTIME-INR  APTT  CBC  DIFFERENTIAL  COMPREHENSIVE METABOLIC PANEL  TROPONIN I  CBG MONITORING, ED    Pertinent labs  results that were available during my care of the patient were reviewed by me and considered in my medical decision making (see chart for details). ____________________________________________  EKG  I personally interpreted any EKGs ordered by me or triage Sinus rhythm rate 60 bpm no acute ST elevation or depression normal axis unremarkable EKG ____________________________________________  RADIOLOGY  Pertinent labs & imaging results that were available during my care of the patient were reviewed by me and considered in my medical decision making (see chart for details). If possible, patient and/or family made aware of any abnormal findings.  No results found. ____________________________________________    PROCEDURES  Procedure(s) performed: None  Procedures  Critical Care performed: CRITICAL CARE Performed by: Schuyler Amor   Total critical care time: 45 minutes  Critical care time was exclusive of separately billable procedures and treating other patients.  Critical care was necessary to treat or prevent imminent or life-threatening deterioration.  Critical care was time spent personally by me on the following activities: development of treatment plan with patient and/or surrogate as well as nursing, discussions with consultants, evaluation of patient's response to treatment, examination of patient, obtaining history from patient or surrogate, ordering and performing treatments and interventions, ordering and review of laboratory studies, ordering and review of radiographic studies, pulse oximetry and re-evaluation of patient's condition.   ____________________________________________   INITIAL IMPRESSION / ASSESSMENT AND PLAN / ED COURSE  Pertinent labs & imaging results that were available during my care of the patient were reviewed by me and  considered in my medical decision making (see chart for details).  Patient here with possible stroke symptoms with visual field losses which are difficult to exactly quantify as he is having some trouble following the exam.  He is awake and alert, we did turn him as a code stroke although the exact time of onset is not exactly clear and his med list does include Eliquis which likely would prohibit TPA combined.  However  we will evaluate him for bleed and talk to neurology.   ----------------------------------------- 7:59 PM on 02/22/2018 -----------------------------------------  Wife is here, we still do not know exactly the time of onset he was last seen normal at 4 PM, patient is somewhat confused but otherwise reassuring neurologic exam, according to family he has waxing and waning vision in that eye for well over a year has had MRIs for this, prior neurology notes that I have been able to access demonstrate that the patient has had migraines with associated vision loss and other similar nonspecific visual field deficits.  Patient does not have any significant complaints at this time he still unsure the date but otherwise he is acting fairly normally, neurologic pathology is still suspected, TPA is not likely to be indicated given multiple different reasons.  ----------------------------------------- 9:03 PM on 02/22/2018 -----------------------------------------  Patient in no acute distress, he seems to be less confused although still a very poor historian seen by tele-neurology, they do recommend TPA for obvious reasons however they do recommend admission for further work-up for acute confusion, fall, and ongoing visual deficits   ____________________________________________   FINAL CLINICAL IMPRESSION(S) / ED DIAGNOSES  Final diagnoses:  None      This chart was dictated using voice recognition software.  Despite best efforts to proofread,  errors can occur which can change  meaning.      Schuyler Amor, MD 02/22/18 1936    Schuyler Amor, MD 02/22/18 Lattie Corns    Schuyler Amor, MD 02/22/18 2000    Schuyler Amor, MD 02/22/18 2104

## 2018-02-23 ENCOUNTER — Observation Stay: Payer: PPO

## 2018-02-23 ENCOUNTER — Observation Stay (HOSPITAL_BASED_OUTPATIENT_CLINIC_OR_DEPARTMENT_OTHER)
Admit: 2018-02-23 | Discharge: 2018-02-23 | Disposition: A | Discharge status: Home / Self Care | Payer: PPO | Attending: Internal Medicine | Admitting: Internal Medicine

## 2018-02-23 ENCOUNTER — Other Ambulatory Visit: Payer: Self-pay

## 2018-02-23 DIAGNOSIS — I6523 Occlusion and stenosis of bilateral carotid arteries: Secondary | ICD-10-CM | POA: Diagnosis not present

## 2018-02-23 DIAGNOSIS — I639 Cerebral infarction, unspecified: Secondary | ICD-10-CM | POA: Diagnosis not present

## 2018-02-23 DIAGNOSIS — R4189 Other symptoms and signs involving cognitive functions and awareness: Secondary | ICD-10-CM | POA: Diagnosis not present

## 2018-02-23 DIAGNOSIS — I5030 Unspecified diastolic (congestive) heart failure: Secondary | ICD-10-CM

## 2018-02-23 DIAGNOSIS — R29818 Other symptoms and signs involving the nervous system: Secondary | ICD-10-CM | POA: Diagnosis not present

## 2018-02-23 DIAGNOSIS — H53461 Homonymous bilateral field defects, right side: Secondary | ICD-10-CM | POA: Diagnosis present

## 2018-02-23 DIAGNOSIS — R4020 Unspecified coma: Secondary | ICD-10-CM | POA: Diagnosis not present

## 2018-02-23 LAB — HEMOGLOBIN A1C
Hgb A1c MFr Bld: 7.1 % — ABNORMAL HIGH (ref 4.8–5.6)
MEAN PLASMA GLUCOSE: 157.07 mg/dL

## 2018-02-23 LAB — LIPID PANEL
CHOLESTEROL: 167 mg/dL (ref 0–200)
HDL: 43 mg/dL (ref >40)
LDL Cholesterol: 95 mg/dL (ref 0–99)
Total CHOL/HDL Ratio: 3.9 RATIO
Triglycerides: 144 mg/dL (ref <150)
VLDL: 29 mg/dL (ref 0–40)

## 2018-02-23 LAB — GLUCOSE, CAPILLARY
GLUCOSE-CAPILLARY: 181 mg/dL — AB (ref 65–99)
Glucose-Capillary: 144 mg/dL — ABNORMAL HIGH (ref 65–99)
Glucose-Capillary: 164 mg/dL — ABNORMAL HIGH (ref 65–99)

## 2018-02-23 LAB — ECHOCARDIOGRAM COMPLETE
HEIGHTINCHES: 68 in
WEIGHTICAEL: 2800 [oz_av]

## 2018-02-23 LAB — POCT CBG MONITORING: POCT Glucose (KUC): 141 mg/dL — AB (ref 70–99)

## 2018-02-23 MED ORDER — INSULIN ASPART 100 UNIT/ML ~~LOC~~ SOLN: 0.0000 [IU] | Freq: Every day | SUBCUTANEOUS | Status: DC

## 2018-02-23 MED ORDER — LORAZEPAM 2 MG/ML IJ SOLN: 1.0000 mg | INTRAMUSCULAR | Status: DC | PRN

## 2018-02-23 MED ORDER — ORAL CARE MOUTH RINSE
15.0000 mL | Freq: Two times a day (BID) | OROMUCOSAL | Status: DC
  Administered 2018-02-23 – 2018-02-27 (×6): 15 mL via OROMUCOSAL

## 2018-02-23 MED ORDER — INSULIN ASPART 100 UNIT/ML ~~LOC~~ SOLN
0.0000 [IU] | Freq: Three times a day (TID) | SUBCUTANEOUS | Status: DC
  Administered 2018-02-23: 13:00:00 2 [IU] via SUBCUTANEOUS
  Administered 2018-02-23: 1 [IU] via SUBCUTANEOUS
  Administered 2018-02-25: 12:00:00 2 [IU] via SUBCUTANEOUS
  Administered 2018-02-25 – 2018-02-26 (×4): 1 [IU] via SUBCUTANEOUS
  Administered 2018-02-26: 2 [IU] via SUBCUTANEOUS
  Administered 2018-02-27: 3 [IU] via SUBCUTANEOUS
  Administered 2018-02-27: 13:00:00 2 [IU] via SUBCUTANEOUS
  Filled 2018-02-23 (×10): qty 1

## 2018-02-23 MED ORDER — ACETAMINOPHEN 650 MG RE SUPP: 650.0000 mg | RECTAL | Status: DC | PRN

## 2018-02-23 MED ORDER — ACETAMINOPHEN 325 MG PO TABS
650.0000 mg | ORAL_TABLET | ORAL | Status: DC | PRN
  Administered 2018-02-26 – 2018-02-27 (×2): 650 mg via ORAL
  Filled 2018-02-23 (×2): qty 2

## 2018-02-23 MED ORDER — APIXABAN 5 MG PO TABS
5.0000 mg | ORAL_TABLET | Freq: Two times a day (BID) | ORAL | Status: DC
  Administered 2018-02-23 – 2018-02-27 (×10): 5 mg via ORAL
  Filled 2018-02-23 (×10): qty 1

## 2018-02-23 MED ORDER — ATORVASTATIN CALCIUM 20 MG PO TABS
20.0000 mg | ORAL_TABLET | Freq: Every day | ORAL | Status: DC
  Administered 2018-02-23 – 2018-02-26 (×4): 20 mg via ORAL
  Filled 2018-02-23 (×4): qty 1

## 2018-02-23 MED ORDER — ACETAMINOPHEN 160 MG/5ML PO SOLN: 650.0000 mg | ORAL | Status: DC | PRN

## 2018-02-23 MED ORDER — MIDAZOLAM HCL 2 MG/2ML IJ SOLN: 1.0000 mg | INTRAMUSCULAR | Status: DC | PRN

## 2018-02-23 MED ORDER — LATANOPROST 0.005 % OP SOLN
1.0000 [drp] | Freq: Every day | OPHTHALMIC | Status: DC
  Administered 2018-02-23 – 2018-02-26 (×5): 1 [drp] via OPHTHALMIC
  Filled 2018-02-23: qty 2.5

## 2018-02-23 MED ORDER — STROKE: EARLY STAGES OF RECOVERY BOOK
Freq: Once | Status: AC
  Administered 2018-02-23: 01:00:00

## 2018-02-23 MED ORDER — LORAZEPAM 2 MG/ML IJ SOLN
1.0000 mg | Freq: Once | INTRAMUSCULAR | Status: AC
  Administered 2018-02-23: 1 mg via INTRAVENOUS
  Filled 2018-02-23: qty 1

## 2018-02-23 MED ORDER — FLUOXETINE HCL 10 MG PO CAPS
10.0000 mg | ORAL_CAPSULE | Freq: Every day | ORAL | Status: DC
  Administered 2018-02-23 – 2018-02-27 (×4): 10 mg via ORAL
  Filled 2018-02-23 (×5): qty 1

## 2018-02-23 NOTE — Progress Notes (Signed)
[place] [facility] [place] NAME: Kyle Richardson    MR#:  [id number]  DATE OF BIRTH:  [birth year]  SUBJECTIVE:  patient came in with an episode of unresponsiveness. Not details not much known. Presents with significant right-sided weakness along with right vision field deficit past midline. Family in the room.  REVIEW OF SYSTEMS:   Review of Systems  Constitutional: Positive for malaise/fatigue. Negative for chills, fever and weight loss.  HENT: Negative for ear discharge, ear pain and nosebleeds.   Eyes: Positive for blurred vision. Negative for pain and discharge.  Respiratory: Negative for sputum production, shortness of breath, wheezing and stridor.   Cardiovascular: Negative for chest pain, palpitations, orthopnea and PND.  Gastrointestinal: Negative for abdominal pain, diarrhea, nausea and vomiting.  Genitourinary: Negative for frequency and urgency.  Musculoskeletal: Negative for back pain and joint pain.  Neurological: Positive for focal weakness and weakness. Negative for sensory change and speech change.  Psychiatric/Behavioral: Negative for depression and hallucinations. The patient is not nervous/anxious.    Tolerating Diet:yes Tolerating PT: CIR  DRUG ALLERGIES:   Allergies  Allergen Reactions  . Clarithromycin Other (See Comments) and Diarrhea  . Codeine   . Flomax [Tamsulosin Hcl]   . Lamisil [Terbinafine]   . Levofloxacin Other (See Comments)    Diplopia. NEVER put patient on levoquin!  . Sertraline Other (See Comments)  . Sulfa Antibiotics     VITALS:  Blood pressure (!) 156/77, pulse 62, temperature 98.5 F (36.9 C), temperature source Oral, resp. rate 18, height 5\' 8"  (1.727 m), weight 79.4 kg (175 lb), SpO2 95 %.  PHYSICAL EXAMINATION:   Physical Exam  GENERAL:  84 y.o.-year-old patient lying in the bed with no acute distress.  EYES: Pupils equal, round, reactive to light and accommodation. No scleral  icterus. Patient has field of vision cut on the right side. Only statements HEENT: Head atraumatic, normocephalic. Oropharynx and nasopharynx clear.  NECK:  Supple, no jugular venous distention. No thyroid enlargement, no tenderness.  LUNGS: Normal breath sounds bilaterally, no wheezing, rales, rhonchi. No use of accessory muscles of respiration.  CARDIOVASCULAR: S1, S2 normal. No murmurs, rubs, or gallops.  ABDOMEN: Soft, nontender, nondistended. Bowel sounds present. No organomegaly or mass.  EXTREMITIES: No cyanosis, clubbing or edema b/l.    NEUROLOGIC: Cranial nerves II through XII are intact. Right upper and lower extremity hemiparesis decreased sensation in both right and upper lower extremity  PSYCHIATRIC:  patient is alert and oriented x 3.  SKIN: No obvious rash, lesion, or ulcer.   LABORATORY PANEL:  CBC Recent Labs  Lab 02/22/18 1950  WBC 5.6  HGB 14.1  HCT 40.2  PLT 260    Chemistries  Recent Labs  Lab 02/22/18 1950  NA 138  K 3.8  CL 106  CO2 24  GLUCOSE 147*  BUN 18  CREATININE 0.95  CALCIUM 8.6*  AST 37  ALT 36  ALKPHOS 69  BILITOT 0.8   Cardiac Enzymes Recent Labs  Lab 02/22/18 1950  TROPONINI <0.03   RADIOLOGY:  Ct Head Code Stroke Wo Contrast  Result Date: 02/22/2018 CLINICAL DATA:  Code stroke. RIGHT visual loss, RIGHT-sided weakness. On Eliquis. History of hypertension, migraine, hypercholesterolemia, diabetes. EXAM: CT HEAD WITHOUT CONTRAST TECHNIQUE: Contiguous axial images were obtained from the base of the skull through the vertex without intravenous contrast. COMPARISON:  MRI of the head January 04, 2017 FINDINGS: BRAIN: No intraparenchymal hemorrhage, mass effect nor midline shift. The ventricles and  sulci are normal for age. Patchy supratentorial white matter hypodensities less than expected for patient's age, though non-specific are most compatible with chronic small vessel ischemic disease. No acute large vascular territory infarcts. No  abnormal extra-axial fluid collections. Basal cisterns are patent. VASCULAR: Moderate calcific atherosclerosis of the carotid siphons. SKULL: No skull fracture. No significant scalp soft tissue swelling. SINUSES/ORBITS: Minimal paranasal sinus mucosal thickening. Mastoid air cells are well aerated.The included ocular globes and orbital contents are non-suspicious. OTHER: None. ASPECTS [facility] Stroke Program Early CT Score) - Ganglionic level infarction (caudate, lentiform nuclei, internal capsule, insula, M1-M3 cortex): 7 - Supraganglionic infarction (M4-M6 cortex): 3 Total score (0-10 with 10 being normal): 10 IMPRESSION: 1. Negative noncontrast CT HEAD for age. 2. ASPECTS is 10. 3. Critical Value/emergent results were called by telephone at the time of interpretation on 02/22/2018 at 7:45 pm to Dr. Charlotte Crumb , who verbally acknowledged these results. Electronically Signed   By: Elon Alas M.D.   On: 02/22/2018 19:46   ASSESSMENT AND PLAN:   Kyle Richardson  is a 84 y.o. male who presents after being found unresponsive outside by family.  When patient came to he stated that he had a visual disturbance, that he could not see on the right side.  Patient is also confused  #Acute encephalopathy with possible unresponsive episode -strong suspicion for stroke, see below -CT had negative -patient awake alert oriented times two  #  Right homonymous hemianopsia -as well as some right upper extremity weakness, -neurology consultation placed -patient already on eliquis for a fib -MRA/MRI of the brain pending -PT OT speech to see patient  #  Type 2 diabetes mellitus with complication, without long-term current use of insulin (HCC) -sliding scale insulin with corresponding glucose checks  #  Hypertension -permissive hypertension for 24 hours, blood pressure goal less than 220/120, hold antihypertensives for now -PRN hydralazine  #  CAD (coronary artery disease) -continue home meds  # PAF  (paroxysmal atrial fibrillation) ([place]) -home rate controlling medications and oral anticoagulation    #Hypercholesterolemia -home dose statin  Case discussed with Care Management/Social Worker. Management plans discussed with the patient, family and they are in agreement.  CODE STATUS: full  DVT Prophylaxis: elquis  TOTAL TIME TAKING CARE OF THIS PATIENT: *32 minutes.  >50% time spent on counselling and coordination of care  POSSIBLE D/C IN *1-2* DAYS, DEPENDING ON CLINICAL CONDITION.  Note: This dictation was prepared with Dragon dictation along with smaller phrase technology. Any transcriptional errors that result from this process are unintentional.  Fritzi Mandes M.D on 02/23/2018 at 1:47 PM  Between 7am to 6pm - Pager - [phone]  After 6pm go to [email or web address] - password [facility]  Office  [phone]  CC: Primary care physician; Einar Pheasant, MDPatient ID: [name], male   DOB: 04/19/1928, 84 y.o.   MRN: [id number]

## 2018-02-23 NOTE — Progress Notes (Signed)
OT Cancellation Note  Patient Details Name: Kyle Richardson MRN: [id number] DOB: [birth year]   Cancelled Treatment:    Reason Eval/Treat Not Completed: Patient at procedure or test/ unavailable. Patient out at testing will reattempt evaluation at later time.  Amie Portland, OTR/L Dashun Borre L 02/23/2018, 2:54 PM

## 2018-02-23 NOTE — Consult Note (Signed)
Referring Physician: Vianne Bulls    Chief Complaint: right sided weakness and right field cut  HPI: Kyle Richardson is an 84 y.o. male who was found outside yesterday by his family on the ground.  After further questioning patient reported the he started to feel poorly so he just sat down and then decided to lie down.  When he was gotten off the ground was noted to be weak on the right and have difficulty seeing to the right.  Patient was brought to the ED. Initial NIHSS of 5.  Date last known well: Unable to determine Time last known well: Unable to determine tPA Given: No: On Eliquis  Past Medical History:  Diagnosis Date  . 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  s/p PTCA of the mid RCA 1/96, also  s/p  CABG x 4 7/96  . Degenerative disc disease   . Diabetes mellitus ([place])   . Glaucoma   . Hypercholesterolemia   . Hypertension   . Migraine headache    history of  . Osteoarthritis   . Sleep apnea     Past Surgical History:  Procedure Laterality Date  . APPENDECTOMY    . CHOLECYSTECTOMY     Removed  . CORONARY ARTERY BYPASS GRAFT  7/96   x4  . HEMORRHOID SURGERY    . HERNIA REPAIR    . UPP and tonsillectomy  1/86    Family History  Problem Relation Age of Onset  . Heart disease Mother        died age 78  . Heart disease Father        and other vascular disease  . Diabetes Unknown        four siblings  . Heart disease Brother        s/p CABG  . Colon cancer Neg Hx   . Prostate cancer Neg Hx    Social History:  reports that he has never smoked. He has never used smokeless tobacco. He reports that he does not drink alcohol or use drugs.  Allergies:  Allergies  Allergen Reactions  . Clarithromycin Other (See Comments) and Diarrhea  . Codeine   . Flomax [Tamsulosin Hcl]   . Lamisil [Terbinafine]   . Levofloxacin Other (See Comments)    Diplopia. NEVER put patient on levoquin!  . Sertraline Other (See Comments)  . Sulfa  Antibiotics     Medications:  I have reviewed the patient's current medications. Prior to Admission:  Medications Prior to Admission  Medication Sig Dispense Refill Last Dose  . ACCU-CHEK SOFTCLIX LANCETS lancets USE AS DIRECTED 100 each 1 Taking  . apixaban (ELIQUIS) 5 MG TABS tablet Take 5 mg by mouth 2 (two) times daily.    02/22/2018 at 0800  . atorvastatin (LIPITOR) 20 MG tablet TAKE 1 TABLET BY MOUTH EVERY DAY 30 tablet 3 02/22/2018 at 0800  . Calcium Citrate-Vitamin D 315-250 MG-UNIT TABS Take 2 tablets by mouth daily.   02/22/2018 at 0800  . FLUoxetine (PROZAC) 10 MG capsule TAKE 1 CAPSULE BY MOUTH EVERY DAY 30 capsule 3 02/22/2018 at 0800  . fluticasone (FLONASE) 50 MCG/ACT nasal spray SPRAY 2 SPRAYS IN EACH NOSTRIL ONCE A DAY 16 g 2 02/21/2018 at 2000  . latanoprost (XALATAN) 0.005 % ophthalmic solution Place 1 drop into both eyes at bedtime.   02/21/2018 at 2000  . loratadine (CLARITIN) 10 MG tablet Take 10 mg by mouth daily.   02/22/2018 at 0800  .  metoprolol succinate (TOPROL-XL) 25 MG 24 hr tablet Take 25 mg by mouth daily. Take with or immediately following a meal.    02/22/2018 at 0800  . Multiple Vitamin (MULTIVITAMIN) tablet Take 1 tablet by mouth daily.   02/22/2018 at 0800  . Multiple Vitamins-Minerals (PRESERVISION AREDS 2) CAPS Take 1 tablet by mouth 2 (two) times daily.    02/22/2018 at 0800  . nitroGLYCERIN (NITROSTAT) 0.4 MG SL tablet Place 0.4 mg under the tongue every 5 (five) minutes as needed. As directed   PRN at PRN  . ONE TOUCH ULTRA TEST test strip CHECK BLOOD SUGAR TWICE A DAY 180 each 3 Taking  . pyridostigmine (MESTINON) 60 MG tablet Take 1 tablet by mouth 3 (three) times daily.      Scheduled: . apixaban  5 mg Oral BID  . atorvastatin  20 mg Oral Daily  . FLUoxetine  10 mg Oral Daily  . insulin aspart  0-5 Units Subcutaneous QHS  . insulin aspart  0-9 Units Subcutaneous TID WC  . latanoprost  1 drop Both Eyes QHS  . mouth rinse  15 mL Mouth Rinse BID     ROS: History obtained from the patient  General ROS: negative for - chills, fatigue, fever, night sweats, weight gain or weight loss Psychological ROS: memory difficulties Ophthalmic ROS: negative for - blurry vision, double vision, eye pain or loss of vision ENT ROS: negative for - epistaxis, nasal discharge, oral lesions, sore throat, tinnitus or vertigo Allergy and Immunology ROS: negative for - hives or itchy/watery eyes Hematological and Lymphatic ROS: negative for - bleeding problems, bruising or swollen lymph nodes Endocrine ROS: negative for - galactorrhea, hair pattern changes, polydipsia/polyuria or temperature intolerance Respiratory ROS: negative for - cough, hemoptysis, shortness of breath or wheezing Cardiovascular ROS: negative for - chest pain, dyspnea on exertion, edema or irregular heartbeat Gastrointestinal ROS: negative for - abdominal pain, diarrhea, hematemesis, nausea/vomiting or stool incontinence Genito-Urinary ROS: negative for - dysuria, hematuria, incontinence or urinary frequency/urgency Musculoskeletal ROS: negative for - joint swelling or muscular weakness Neurological ROS: as noted in HPI Dermatological ROS: negative for rash and skin lesion changes  Physical Examination: Blood pressure (!) 156/77, pulse 62, temperature 98.5 F (36.9 C), temperature source Oral, resp. rate 18, height 5\' 8"  (1.727 m), weight 79.4 kg (175 lb), SpO2 95 %.  HEENT-  Normocephalic, no lesions, without obvious abnormality.  Normal external eye and conjunctiva.  Normal TM's bilaterally.  Normal auditory canals and external ears. Normal external nose, mucus membranes and septum.  Normal pharynx. Cardiovascular- S1, S2 normal, pulses palpable throughout   Lungs- chest clear, no wheezing, rales, normal symmetric air entry Abdomen- soft, non-tender; bowel sounds normal; no masses,  no organomegaly Extremities- no edema Lymph-no adenopathy palpable Musculoskeletal-no joint  tenderness, deformity or swelling Skin-warm and dry, no hyperpigmentation, vitiligo, or suspicious lesions  Neurological Examination   Mental Status: Alert, oriented, thought content appropriate.  Speech fluent without evidence of aphasia.  Able to follow 3 step commands without difficulty. Cranial Nerves: II: Discs flat bilaterally; RHH, pupils equal, round, reactive to light and accommodation III,IV, VI: ptosis not present, extra-ocular motions intact bilaterally V,VII: smile symmetric, facial light touch sensation normal bilaterally VIII: hearing normal bilaterally IX,X: gag reflex present XI: bilateral shoulder shrug XII: midline tongue extension Motor: Right : Upper extremity  1-2/5    Left:     Upper extremity   5/5  Lower extremity   4/5     Lower extremity  5/5 Tone and bulk:normal tone throughout; no atrophy noted Sensory: Pinprick and light touch intact throughout, bilaterally Deep Tendon Reflexes: 2+ with absent AJ's bilaterally Plantars: Right: upgoing   Left: downgoing Cerebellar: Normal finger-to-nose and normal heel-to-shin testing on the left Gait: not tested due to safety concerns    Laboratory Studies:  Basic Metabolic Panel: Recent Labs  Lab 02/22/18 1950  NA 138  K 3.8  CL 106  CO2 24  GLUCOSE 147*  BUN 18  CREATININE 0.95  CALCIUM 8.6*    Liver Function Tests: Recent Labs  Lab 02/22/18 1950  AST 37  ALT 36  ALKPHOS 69  BILITOT 0.8  PROT 6.3*  ALBUMIN 3.6   No results for input(s): LIPASE, AMYLASE in the last 168 hours. No results for input(s): AMMONIA in the last 168 hours.  CBC: Recent Labs  Lab 02/22/18 1950  WBC 5.6  NEUTROABS 3.4  HGB 14.1  HCT 40.2  MCV 95.3  PLT 260    Cardiac Enzymes: Recent Labs  Lab 02/22/18 1950  TROPONINI <0.03    BNP: Invalid input(s): POCBNP  CBG: Recent Labs  Lab 02/22/18 1955 02/23/18 0830 02/23/18 1236  GLUCAP 152* 144* 164*    Microbiology: Results for orders placed or  performed in visit on 01/28/18  Urine Culture     Status: None   Collection Time: 01/28/18  8:36 AM  Result Value Ref Range Status   MICRO NUMBER: [id number]  Final   SPECIMEN QUALITY: ADEQUATE  Final   Sample Source URINE  Final   STATUS: FINAL  Final   Result: No Growth  Final    Coagulation Studies: Recent Labs    02/22/18 1950  LABPROT 14.3  INR 1.12    Urinalysis: No results for input(s): COLORURINE, LABSPEC, PHURINE, GLUCOSEU, HGBUR, BILIRUBINUR, KETONESUR, PROTEINUR, UROBILINOGEN, NITRITE, LEUKOCYTESUR in the last 168 hours.  Invalid input(s): APPERANCEUR  Lipid Panel:    Component Value Date/Time   CHOL 167 02/23/2018 0354   CHOL 129 08/13/2013 1026   TRIG 144 02/23/2018 0354   TRIG 166 08/13/2013 1026   HDL 43 02/23/2018 0354   HDL 47 08/13/2013 1026   CHOLHDL 3.9 02/23/2018 0354   VLDL 29 02/23/2018 0354   VLDL 33 08/13/2013 1026   LDLCALC 95 02/23/2018 0354   LDLCALC 49 08/13/2013 1026    HgbA1C:  Lab Results  Component Value Date   HGBA1C 7.1 (H) 02/23/2018    Urine Drug Screen:  No results found for: LABOPIA, COCAINSCRNUR, LABBENZ, AMPHETMU, THCU, LABBARB  Alcohol Level: No results for input(s): ETH in the last 168 hours.  Other results: EKG: normal sinus rhythm at 60 bpm.  Imaging: US Carotid Bilateral (at [facility] Only)  Result Date: 02/23/2018 CLINICAL DATA:  84 year old male with a history of unresponsiveness. Cardiovascular risk factors include hypertension, coronary disease, hyperlipidemia, diabetes EXAM: BILATERAL CAROTID DUPLEX ULTRASOUND TECHNIQUE: Pearline Cables scale imaging, color Doppler and duplex ultrasound were performed of bilateral carotid and vertebral arteries in the neck. COMPARISON:  09/10/2015 FINDINGS: Criteria: Quantification of carotid stenosis is based on velocity parameters that correlate the residual internal carotid diameter with NASCET-based stenosis levels, using the diameter of the distal internal carotid lumen as the  denominator for stenosis measurement. The following velocity measurements were obtained: RIGHT ICA:  Systolic 798 cm/sec, Diastolic 43 cm/sec CCA:  921 cm/sec SYSTOLIC ICA/CCA RATIO:  2.0 ECA:  109 cm/sec LEFT ICA:  Systolic 194 cm/sec, Diastolic 13 cm/sec CCA:  90 cm/sec SYSTOLIC ICA/CCA RATIO:  1.4  ECA:  229 cm/sec Right Brachial SBP: Not acquired Left Brachial SBP: Not acquired RIGHT CAROTID ARTERY: No significant calcifications of the right common carotid artery. Intermediate waveform maintained. Heterogeneous and partially calcified plaque at the right carotid bifurcation. No significant lumen shadowing. Low resistance waveform of the right ICA. No significant tortuosity. RIGHT VERTEBRAL ARTERY: Antegrade flow with low resistance waveform. LEFT CAROTID ARTERY: No significant calcifications of the left common carotid artery. Intermediate waveform maintained. Heterogeneous and partially calcified plaque at the left carotid bifurcation without significant lumen shadowing. Low resistance waveform of the left ICA. No significant tortuosity. LEFT VERTEBRAL ARTERY:  Antegrade flow with low resistance waveform. IMPRESSION: Right: Heterogeneous and partially calcified plaque at the right carotid bifurcation contributes to 50%-69% stenosis by established duplex criteria. Left: Color duplex indicates minimal heterogeneous and calcified plaque, with no hemodynamically significant stenosis by duplex criteria in the extracranial cerebrovascular circulation. Signed, Dulcy Fanny. Earleen Newport, DO [facility] Electronically Signed   By: Corrie Mckusick D.O.   On: 02/23/2018 14:10   Ct Head Code Stroke Wo Contrast  Result Date: 02/22/2018 CLINICAL DATA:  Code stroke. RIGHT visual loss, RIGHT-sided weakness. On Eliquis. History of hypertension, migraine, hypercholesterolemia, diabetes. EXAM: CT HEAD WITHOUT CONTRAST TECHNIQUE: Contiguous axial images were obtained from the base  of the skull through the vertex without intravenous contrast. COMPARISON:  MRI of the head January 04, 2017 FINDINGS: BRAIN: No intraparenchymal hemorrhage, mass effect nor midline shift. The ventricles and sulci are normal for age. Patchy supratentorial white matter hypodensities less than expected for patient's age, though non-specific are most compatible with chronic small vessel ischemic disease. No acute large vascular territory infarcts. No abnormal extra-axial fluid collections. Basal cisterns are patent. VASCULAR: Moderate calcific atherosclerosis of the carotid siphons. SKULL: No skull fracture. No significant scalp soft tissue swelling. SINUSES/ORBITS: Minimal paranasal sinus mucosal thickening. Mastoid air cells are well aerated.The included ocular globes and orbital contents are non-suspicious. OTHER: None. ASPECTS [facility] Stroke Program Early CT Score) - Ganglionic level infarction (caudate, lentiform nuclei, internal capsule, insula, M1-M3 cortex): 7 - Supraganglionic infarction (M4-M6 cortex): 3 Total score (0-10 with 10 being normal): 10 IMPRESSION: 1. Negative noncontrast CT HEAD for age. 2. ASPECTS is 10. 3. Critical Value/emergent results were called by telephone at the time of interpretation on 02/22/2018 at 7:45 pm to Dr. Charlotte Crumb , who verbally acknowledged these results. Electronically Signed   By: Elon Alas M.D.   On: 02/22/2018 19:46    Assessment: 84 y.o. male presenting with [facility] and right sided weakness.  Head CT reviewed and shows no acute changes.  Despite being on Eliquis prior to admission suspect acute infarct of embolic etiology.  Carotid dopplers show RICA at 50-69% stenosis and left without hemodynamically significant stenosis.  A1c 7.1, LDL 95.    Stroke Risk Factors - atrial fibrillation, diabetes mellitus, hyperlipidemia and hypertension  Plan: 1. Aggressive lipid management with target LDL<70. 2. MRI, MRA  of the brain without contrast 3. PT consult, OT  consult, Speech consult 4. Echocardiogram 5. Prophylactic therapy-Continue Eliquis 7. NPO until RN stroke swallow screen 8. Telemetry monitoring 9. Frequent neuro checks 10. Blood sugar management with target A1c<7.0.     Alexis Goodell, MD Neurology [phone] 02/23/2018, 3:38 PM

## 2018-02-23 NOTE — Evaluation (Signed)
Physical Therapy Evaluation Patient Details Name: Kyle Richardson MRN: [id number] DOB: October 31, 1927 Today's Date: 02/23/2018   History of Present Illness  Pt admitted for unresponsive episode, found outside by family. History includes CAD, DM, glaucoma, and HTN. Pt pending MRI, although CT negative for acute CVA.  Clinical Impression  Pt is a pleasant 84 year old male who was admitted for unresponsive episode. Now presents with significant R side weakness along with R vision field deficit past midline. Pt performs bed mobility with mod assist and transfers with max assist using RW. Unable to ambulate at this time due to weakness. Decreased sensation noted in R hemibody compared to L side. Positive pronator drift noted in R arm and decreased coordination. Pt with head turned towards L side with some early onset neglect noted. Educated family on staying towards R side to promote communication on that side and head turning. Educated on positioning of weaker extremity.  Pt demonstrates deficits with strength/coordination/endurance/mobility. Pt is currently not at baseline level. Would benefit from skilled PT to address above deficits and promote optimal return to PLOF. Would benefit from CIR for aggressive therapy.       Follow Up Recommendations CIR    Equipment Recommendations  (RW vs HW)    Recommendations for Other Services OT consult;Rehab consult     Precautions / Restrictions Precautions Precautions: Fall Restrictions Weight Bearing Restrictions: No      Mobility  Bed Mobility Overal bed mobility: Needs Assistance Bed Mobility: Supine to Sit     Supine to sit: Mod assist     General bed mobility comments: needs assist to slide B LEs off bed and heavy cues for sequencing. Once seated at EOB, tends to lose balance in post direction. Needs min assist to maintain upright posture, however improving to cga.  Transfers Overall transfer level: Needs assistance Equipment used: Rolling  walker (2 wheeled) Transfers: Sit to/from Stand Sit to Stand: Max assist         General transfer comment: attempted standing with RW with ability to stand with forward flexed posture and B knee flexion. Unable to maintain balance for further mobility. Returned back supine. R hand falls off RW, needs therapist for hand placement  Ambulation/Gait             General Gait Details: not appropriate  Stairs            Wheelchair Mobility    Modified Rankin (Stroke Patients Only)       Balance Overall balance assessment: Needs assistance Sitting-balance support: Feet supported;Single extremity supported Sitting balance-Leahy Scale: Fair     Standing balance support: Bilateral upper extremity supported Standing balance-Leahy Scale: Poor                               Pertinent Vitals/Pain Pain Assessment: No/denies pain    Home Living Family/patient expects to be discharged to:: Private residence Living Arrangements: Spouse/significant other Available Help at Discharge: Family Type of Home: House Home Access: Stairs to enter Entrance Stairs-Rails: Can reach both Entrance Stairs-Number of Steps: 2 Home Layout: One level Home Equipment: None      Prior Function Level of Independence: Independent         Comments: no AD and was driving prior to admission     Hand Dominance        Extremity/Trunk Assessment   Upper Extremity Assessment Upper Extremity Assessment: RUE deficits/detail(L UE grossly 4/5) RUE Deficits /  Details: R grip 3/5; elbow 3+/5; shoulder flexion 3/5 RUE Sensation: decreased light touch RUE Coordination: decreased gross motor;decreased fine motor    Lower Extremity Assessment Lower Extremity Assessment: RLE deficits/detail(L LE grossly 4/5) RLE Deficits / Details: R ankle DF grossly 3+/5; knee ext 3+/5; hip flexion 3-/5 RLE Sensation: decreased light touch RLE Coordination: decreased gross motor        Communication   Communication: No difficulties  Cognition Arousal/Alertness: Awake/alert Behavior During Therapy: WFL for tasks assessed/performed Overall Cognitive Status: Within Functional Limits for tasks assessed                                        General Comments      Exercises Other Exercises Other Exercises: educated on supine ther-ex including R LE ankle pumps, SLRs, and heel slides. ALl ther-ex performed x 5 reps prior to fatigue with cga. Educated family in room on frequency and duration. Also educated on positioning of L shoulder/arm on pillows.   Assessment/Plan    PT Assessment Patient needs continued PT services  PT Problem List Decreased strength;Decreased balance;Decreased mobility;Decreased coordination;Decreased knowledge of use of DME;Impaired sensation       PT Treatment Interventions DME instruction;Gait training;Therapeutic activities;Therapeutic exercise;Balance training    PT Goals (Current goals can be found in the Care Plan section)  Acute Rehab PT Goals Patient Stated Goal: unable to state goals PT Goal Formulation: With patient Time For Goal Achievement: 03/09/18 Potential to Achieve Goals: Good    Frequency 7X/week   Barriers to discharge Inaccessible home environment;Decreased caregiver support      Co-evaluation               AM-PAC PT "6 Clicks" Daily Activity  Outcome Measure Difficulty turning over in bed (including adjusting bedclothes, sheets and blankets)?: Unable Difficulty moving from lying on back to sitting on the side of the bed? : Unable Difficulty sitting down on and standing up from a chair with arms (e.g., wheelchair, bedside commode, etc,.)?: Unable Help needed moving to and from a bed to chair (including a wheelchair)?: Total Help needed walking in hospital room?: Total Help needed climbing 3-5 steps with a railing? : Total 6 Click Score: 6    End of Session Equipment Utilized During  Treatment: Gait belt Activity Tolerance: Patient tolerated treatment well Patient left: in bed;with bed alarm set Nurse Communication: Mobility status PT Visit Diagnosis: Muscle weakness (generalized) (M62.81);History of falling (Z91.81);Difficulty in walking, not elsewhere classified (R26.2);Hemiplegia and hemiparesis Hemiplegia - Right/Left: Right Hemiplegia - dominant/non-dominant: Dominant Hemiplegia - caused by: Unspecified    Time: 6213-0865 PT Time Calculation (min) (ACUTE ONLY): 21 min   Charges:   PT Evaluation $PT Eval Moderate Complexity: 1 Mod PT Treatments $Therapeutic Exercise: 8-22 mins   PT G CodesGreggory Stallion, PT, DPT [phone]   Arretta Toenjes 02/23/2018, 1:39 PM

## 2018-02-23 NOTE — Progress Notes (Signed)
Inpatient Rehabilitation  Per PT request, patient was screened by Gunnar Fusi for appropriateness for an Inpatient Acute Rehab consult.  At this time we are recommending an Inpatient Rehab consult.  Please order if you are agreeable and plan for my co-worker Karene Fry to follow up Monday 02/25/18.  Carmelia Roller., CCC/SLP Admission Coordinator  [facility]  Cell [phone]

## 2018-02-23 NOTE — Care Management Obs Status (Signed)
[facility] NOTIFICATION   Patient Details  Name: Kyle Richardson MRN: [id number] Date of Birth: Apr 19, 1928   Medicare Observation Status Notification Given:  Yes    Marshell Garfinkel, RN 02/23/2018, 7:57 AM

## 2018-02-23 NOTE — Progress Notes (Signed)
Pt severely claustrophobic requiring sedation for MRI. The medication that was ordered is not administered on this unit. MD was notified and order changed.Will pass on information to oncoming nurse to complete.  Kyla Balzarine, LPN

## 2018-02-23 NOTE — Care Management (Signed)
RNCM met with patient that is very hard of hearing and his brother to discuss [place] letter. Patient at baseline is independent from home with his wife that is dependent on walker for ambulation per brother.  At baseline patient was able to drive also.  His PCP is Dr. Nicki Reaper. Denies problems obtaining medications. PT/OT evaluation pending.

## 2018-02-23 NOTE — Evaluation (Signed)
Speech Language Pathology Evaluation Patient Details Name: Kyle Richardson MRN: [id number] DOB: 1928/05/24 Today's Date: 02/23/2018 Time: 7829-5621 SLP Time Calculation (min) (ACUTE ONLY): 32 min  Problem List:  Patient Active Problem List   Diagnosis Date Noted  . Right homonymous hemianopsia 02/23/2018  . Unresponsive episode 02/22/2018  . UTI (urinary tract infection) 01/28/2018  . Abnormal chest CT 09/20/2017  . Double vision 01/21/2017  . Ascending aortic aneurysm ([place]) 12/22/2016  . Blood-tinged sputum 10/23/2016  . Cough 10/23/2016  . PAF (paroxysmal atrial fibrillation) ([place]) 09/14/2016  . Health care maintenance 12/26/2014  . Stress 07/05/2014  . Migraine 05/10/2013  . Type 2 diabetes mellitus with complication, without long-term current use of insulin ([place]) 08/20/2012  . Hypertension 08/20/2012  . Hypercholesterolemia 08/20/2012  . CAD (coronary artery disease) 08/20/2012   Past Medical History:  Past Medical History:  Diagnosis Date  . 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  s/p PTCA of the mid RCA 1/96, also  s/p  CABG x 4 7/96  . Degenerative disc disease   . Diabetes mellitus ([place])   . Glaucoma   . Hypercholesterolemia   . Hypertension   . Migraine headache    history of  . Osteoarthritis   . Sleep apnea    Past Surgical History:  Past Surgical History:  Procedure Laterality Date  . APPENDECTOMY    . CHOLECYSTECTOMY     Removed  . CORONARY ARTERY BYPASS GRAFT  7/96   x4  . HEMORRHOID SURGERY    . HERNIA REPAIR    . UPP and tonsillectomy  1/86   HPI:      Assessment / Plan / Recommendation Clinical Impression  pt presents with a mild to moderate cognitive linguistic deficit as characterized by an inability to verbally recall and name birthdate, day of week, month of year, family members names, ages and location as he is able to do at baseline. Family that was present in room states that he is improving overall  but is very alert and oriented at baseline.   pt does have hearing derficit with no hearing aid present however family has retreived it from home and is charging them. pt is also having right sided vison impairment and must be addressed from left side.   pt undable to verbally name items in catagory set as able to edo at baseline according to family.     SLP Assessment  SLP Recommendation/Assessment: Patient needs continued Speech Lanaguage Pathology Services SLP Visit Diagnosis: Cognitive communication deficit (R41.841)    Follow Up Recommendations       Frequency and Duration min 3x week  2 weeks      SLP Evaluation Cognition  Overall Cognitive Status: Impaired/Different from baseline Arousal/Alertness: Awake/alert Orientation Level: Oriented to person Attention: Focused Focused Attention: Appears intact Memory: Impaired Memory Impairment: Storage deficit;Decreased long term memory;Retrieval deficit;Decreased short term memory Decreased Long Term Memory: Verbal basic Decreased Short Term Memory: Verbal basic Safety/Judgment: Impaired       Comprehension  Auditory Comprehension Overall Auditory Comprehension: Impaired Yes/No Questions: Within Functional Limits Commands: Impaired One Step Basic Commands: 75-100% accurate Multistep Basic Commands: 25-49% accurate Conversation: Simple Interfering Components: Processing speed;Working memory;Visual impairments EffectiveTechniques: Extra processing time;Increased volume;Visual/Gestural cues;Repetition;Slowed speech    Expression Expression Primary Mode of Expression: Verbal Verbal Expression Overall Verbal Expression: Impaired Initiation: No impairment Automatic Speech: Name;Social Response;Counting Level of Generative/Spontaneous Verbalization: Phrase Naming: Impairment Responsive: 26-50% accurate Confrontation: Impaired Effective Techniques: Sentence completion;Phonemic  cues   Oral / Motor  Oral Motor/Sensory  Function Overall Oral Motor/Sensory Function: Within functional limits   GO                    [place] Sauber 02/23/2018, 10:33 AM

## 2018-02-24 DIAGNOSIS — Z888 Allergy status to other drugs, medicaments and biological substances status: Secondary | ICD-10-CM | POA: Diagnosis not present

## 2018-02-24 DIAGNOSIS — Z882 Allergy status to sulfonamides status: Secondary | ICD-10-CM | POA: Diagnosis not present

## 2018-02-24 DIAGNOSIS — E1151 Type 2 diabetes mellitus with diabetic peripheral angiopathy without gangrene: Secondary | ICD-10-CM | POA: Diagnosis present

## 2018-02-24 DIAGNOSIS — E785 Hyperlipidemia, unspecified: Secondary | ICD-10-CM | POA: Diagnosis present

## 2018-02-24 DIAGNOSIS — R410 Disorientation, unspecified: Secondary | ICD-10-CM | POA: Diagnosis present

## 2018-02-24 DIAGNOSIS — H409 Unspecified glaucoma: Secondary | ICD-10-CM | POA: Diagnosis present

## 2018-02-24 DIAGNOSIS — I10 Essential (primary) hypertension: Secondary | ICD-10-CM | POA: Diagnosis present

## 2018-02-24 DIAGNOSIS — E7800 Pure hypercholesterolemia, unspecified: Secondary | ICD-10-CM | POA: Diagnosis present

## 2018-02-24 DIAGNOSIS — Z881 Allergy status to other antibiotic agents status: Secondary | ICD-10-CM | POA: Diagnosis not present

## 2018-02-24 DIAGNOSIS — G7000 Myasthenia gravis without (acute) exacerbation: Secondary | ICD-10-CM | POA: Diagnosis present

## 2018-02-24 DIAGNOSIS — Z885 Allergy status to narcotic agent status: Secondary | ICD-10-CM | POA: Diagnosis not present

## 2018-02-24 DIAGNOSIS — R4189 Other symptoms and signs involving cognitive functions and awareness: Secondary | ICD-10-CM

## 2018-02-24 DIAGNOSIS — Z79899 Other long term (current) drug therapy: Secondary | ICD-10-CM | POA: Diagnosis not present

## 2018-02-24 DIAGNOSIS — I2510 Atherosclerotic heart disease of native coronary artery without angina pectoris: Secondary | ICD-10-CM | POA: Diagnosis present

## 2018-02-24 DIAGNOSIS — M1991 Primary osteoarthritis, unspecified site: Secondary | ICD-10-CM | POA: Diagnosis present

## 2018-02-24 DIAGNOSIS — Z951 Presence of aortocoronary bypass graft: Secondary | ICD-10-CM | POA: Diagnosis not present

## 2018-02-24 DIAGNOSIS — Z8673 Personal history of transient ischemic attack (TIA), and cerebral infarction without residual deficits: Secondary | ICD-10-CM | POA: Diagnosis present

## 2018-02-24 DIAGNOSIS — I63432 Cerebral infarction due to embolism of left posterior cerebral artery: Secondary | ICD-10-CM | POA: Diagnosis present

## 2018-02-24 DIAGNOSIS — D6859 Other primary thrombophilia: Secondary | ICD-10-CM | POA: Diagnosis present

## 2018-02-24 DIAGNOSIS — G4730 Sleep apnea, unspecified: Secondary | ICD-10-CM | POA: Diagnosis present

## 2018-02-24 DIAGNOSIS — R29705 NIHSS score 5: Secondary | ICD-10-CM | POA: Diagnosis present

## 2018-02-24 DIAGNOSIS — Z7984 Long term (current) use of oral hypoglycemic drugs: Secondary | ICD-10-CM | POA: Diagnosis not present

## 2018-02-24 DIAGNOSIS — I252 Old myocardial infarction: Secondary | ICD-10-CM | POA: Diagnosis not present

## 2018-02-24 DIAGNOSIS — Z7951 Long term (current) use of inhaled steroids: Secondary | ICD-10-CM | POA: Diagnosis not present

## 2018-02-24 DIAGNOSIS — I480 Paroxysmal atrial fibrillation: Secondary | ICD-10-CM | POA: Diagnosis present

## 2018-02-24 DIAGNOSIS — H53461 Homonymous bilateral field defects, right side: Secondary | ICD-10-CM | POA: Diagnosis present

## 2018-02-24 DIAGNOSIS — G9340 Encephalopathy, unspecified: Secondary | ICD-10-CM | POA: Diagnosis present

## 2018-02-24 LAB — GLUCOSE, CAPILLARY
GLUCOSE-CAPILLARY: 151 mg/dL — AB (ref 65–99)
GLUCOSE-CAPILLARY: 139 mg/dL — AB (ref 65–99)
GLUCOSE-CAPILLARY: 167 mg/dL — AB (ref 65–99)
Glucose-Capillary: 154 mg/dL — ABNORMAL HIGH (ref 65–99)

## 2018-02-24 MED ORDER — PYRIDOSTIGMINE BROMIDE 60 MG PO TABS: 60.0000 mg | ORAL_TABLET | Freq: Three times a day (TID) | ORAL | Status: DC

## 2018-02-24 MED ORDER — METOPROLOL SUCCINATE ER 25 MG PO TB24
25.0000 mg | ORAL_TABLET | Freq: Every day | ORAL | Status: DC
  Administered 2018-02-24 – 2018-02-27 (×4): 25 mg via ORAL
  Filled 2018-02-24 (×4): qty 1

## 2018-02-24 MED ORDER — ADULT MULTIVITAMIN W/MINERALS CH
1.0000 | ORAL_TABLET | Freq: Every day | ORAL | Status: DC
  Administered 2018-02-24 – 2018-02-27 (×4): 1 via ORAL
  Filled 2018-02-24 (×4): qty 1

## 2018-02-24 MED ORDER — PYRIDOSTIGMINE BROMIDE 60 MG PO TABS
30.0000 mg | ORAL_TABLET | Freq: Two times a day (BID) | ORAL | Status: DC
  Administered 2018-02-24 – 2018-02-27 (×3): 30 mg via ORAL
  Filled 2018-02-24 (×8): qty 0.5

## 2018-02-24 MED ORDER — PRESERVISION AREDS 2 PO CAPS: 1.0000 | ORAL_CAPSULE | Freq: Two times a day (BID) | ORAL | Status: DC

## 2018-02-24 MED ORDER — CALCIUM CITRATE-VITAMIN D 500-500 MG-UNIT PO CHEW
1.0000 | CHEWABLE_TABLET | Freq: Every day | ORAL | Status: DC
  Administered 2018-02-24 – 2018-02-26 (×3): 1 via ORAL
  Filled 2018-02-24 (×4): qty 1

## 2018-02-24 MED ORDER — FLUTICASONE PROPIONATE 50 MCG/ACT NA SUSP
1.0000 | Freq: Every day | NASAL | Status: DC
  Administered 2018-02-24: 1 via NASAL
  Filled 2018-02-24: qty 16

## 2018-02-24 MED ORDER — METFORMIN HCL ER 500 MG PO TB24
500.0000 mg | ORAL_TABLET | Freq: Every day | ORAL | Status: DC
  Administered 2018-02-25 – 2018-02-27 (×3): 500 mg via ORAL
  Filled 2018-02-24 (×3): qty 1

## 2018-02-24 MED ORDER — CALCIUM CITRATE-VITAMIN D 315-250 MG-UNIT PO TABS: 2.0000 | ORAL_TABLET | Freq: Every day | ORAL | Status: DC

## 2018-02-24 MED ORDER — ONE-DAILY MULTI VITAMINS PO TABS: 1.0000 | ORAL_TABLET | Freq: Every day | ORAL | Status: DC

## 2018-02-24 MED ORDER — OCUVITE-LUTEIN PO CAPS
1.0000 | ORAL_CAPSULE | Freq: Two times a day (BID) | ORAL | Status: DC
  Administered 2018-02-24 – 2018-02-27 (×7): 1 via ORAL
  Filled 2018-02-24 (×8): qty 1

## 2018-02-24 NOTE — Progress Notes (Addendum)
[facility] [place] NAME: Kyle Richardson    MR#:  [id number]  DATE OF BIRTH:  12-15-1927  SUBJECTIVE:  patient came in with an episode of unresponsiveness. Not details not much known. Presents with significant right-sided weakness along with right vision field deficit past midline. Family( brother) in the room. Ate good BF REVIEW OF SYSTEMS:   Review of Systems  Constitutional: Positive for malaise/fatigue. Negative for chills, fever and weight loss.  HENT: Negative for ear discharge, ear pain and nosebleeds.   Eyes: Positive for blurred vision. Negative for pain and discharge.  Respiratory: Negative for sputum production, shortness of breath, wheezing and stridor.   Cardiovascular: Negative for chest pain, palpitations, orthopnea and PND.  Gastrointestinal: Negative for abdominal pain, diarrhea, nausea and vomiting.  Genitourinary: Negative for frequency and urgency.  Musculoskeletal: Negative for back pain and joint pain.  Neurological: Positive for focal weakness and weakness. Negative for sensory change and speech change.  Psychiatric/Behavioral: Negative for depression and hallucinations. The patient is not nervous/anxious.    Tolerating Diet:yes Tolerating PT: CIR  DRUG ALLERGIES:   Allergies  Allergen Reactions  . Clarithromycin Other (See Comments) and Diarrhea  . Codeine   . Flomax [Tamsulosin Hcl]   . Lamisil [Terbinafine]   . Levofloxacin Other (See Comments)    Diplopia. NEVER put patient on levoquin!  . Sertraline Other (See Comments)  . Sulfa Antibiotics     VITALS:  Blood pressure (!) 143/72, pulse 72, temperature 99.6 F (37.6 C), temperature source Oral, resp. rate 16, height 5\' 8"  (1.727 m), weight 79.4 kg (175 lb), SpO2 94 %.  PHYSICAL EXAMINATION:   Physical Exam  GENERAL:  84 y.o.-year-old patient lying in the bed with no acute distress.  EYES: Pupils equal, round, reactive to light and  accommodation. No scleral icterus. Patient has field of vision cut on the right side. Only statements HEENT: Head atraumatic, normocephalic. Oropharynx and nasopharynx clear.  NECK:  Supple, no jugular venous distention. No thyroid enlargement, no tenderness.  LUNGS: Normal breath sounds bilaterally, no wheezing, rales, rhonchi. No use of accessory muscles of respiration.  CARDIOVASCULAR: S1, S2 normal. No murmurs, rubs, or gallops.  ABDOMEN: Soft, nontender, nondistended. Bowel sounds present. No organomegaly or mass.  EXTREMITIES: No cyanosis, clubbing or edema b/l.    NEUROLOGIC: Cranial nerves II through XII are intact. Right upper and lower extremity hemiparesis decreased sensation in both right and upper lower extremity  PSYCHIATRIC:  patient is alert and oriented x 3.  SKIN: No obvious rash, lesion, or ulcer.   LABORATORY PANEL:  CBC Recent Labs  Lab 02/22/18 1950  WBC 5.6  HGB 14.1  HCT 40.2  PLT 260    Chemistries  Recent Labs  Lab 02/22/18 1950  NA 138  K 3.8  CL 106  CO2 24  GLUCOSE 147*  BUN 18  CREATININE 0.95  CALCIUM 8.6*  AST 37  ALT 36  ALKPHOS 69  BILITOT 0.8   Cardiac Enzymes Recent Labs  Lab 02/22/18 1950  TROPONINI <0.03   RADIOLOGY:  Mr Brain Wo Contrast  Result Date: 02/23/2018 CLINICAL DATA:  Focal neuro deficit.  Found unresponsive. EXAM: MRI HEAD WITHOUT CONTRAST MRA HEAD WITHOUT CONTRAST TECHNIQUE: Multiplanar, multiecho pulse sequences of the brain and surrounding structures were obtained without intravenous contrast. Angiographic images of the head were obtained using MRA technique without contrast. COMPARISON:  CT head 02/22/2018.  MRI head 02/04/2017 FINDINGS: MRI HEAD FINDINGS Brain: Image quality  degraded by motion Generalized atrophy with mild chronic microvascular ischemic changes in the white matter. Acute infarct left PCA territory. Acute infarct in the left medial temporal lobe, left hippocampus, and left thalamus. Small area of  acute infarct in the left occipital lobe. Negative for hemorrhage or mass. No midline shift. Vascular: Normal arterial flow voids Skull and upper cervical spine: Negative Sinuses/Orbits: Mucosal edema paranasal sinuses. Bilateral cataract removal Other: None MRA HEAD FINDINGS Image quality degraded by motion. Limited information is available on the study. Both vertebral arteries are patent and the basilar is patent. Posterior cerebral arteries not well visualized. Both internal carotid arteries are patent with disease in the cavernous segment. Anterior and middle cerebral arteries not well seen bilaterally, most likely due to motion. 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 MRA degraded by significant motion with limited diagnostic information. Electronically Signed   By: Franchot Gallo M.D.   On: 02/23/2018 18:42   US Carotid Bilateral (at [facility] Only)  Result Date: 02/23/2018 CLINICAL DATA:  84 year old male with a history of unresponsiveness. Cardiovascular risk factors include hypertension, coronary disease, hyperlipidemia, diabetes EXAM: BILATERAL CAROTID DUPLEX ULTRASOUND TECHNIQUE: Pearline Cables scale imaging, color Doppler and duplex ultrasound were performed of bilateral carotid and vertebral arteries in the neck. COMPARISON:  09/10/2015 FINDINGS: Criteria: Quantification of carotid stenosis is based on velocity parameters that correlate the residual internal carotid diameter with NASCET-based stenosis levels, using the diameter of the distal internal carotid lumen as the denominator for stenosis measurement. The following velocity measurements were obtained: RIGHT ICA:  Systolic 329 cm/sec, Diastolic 43 cm/sec CCA:  518 cm/sec SYSTOLIC ICA/CCA RATIO:  2.0 ECA:  109 cm/sec LEFT ICA:  Systolic 841 cm/sec, Diastolic 13 cm/sec CCA:  90 cm/sec SYSTOLIC ICA/CCA RATIO:  1.4 ECA:  229 cm/sec Right Brachial SBP: Not acquired Left Brachial SBP: Not acquired RIGHT CAROTID ARTERY: No significant  calcifications of the right common carotid artery. Intermediate waveform maintained. Heterogeneous and partially calcified plaque at the right carotid bifurcation. No significant lumen shadowing. Low resistance waveform of the right ICA. No significant tortuosity. RIGHT VERTEBRAL ARTERY: Antegrade flow with low resistance waveform. LEFT CAROTID ARTERY: No significant calcifications of the left common carotid artery. Intermediate waveform maintained. Heterogeneous and partially calcified plaque at the left carotid bifurcation without significant lumen shadowing. Low resistance waveform of the left ICA. No significant tortuosity. LEFT VERTEBRAL ARTERY:  Antegrade flow with low resistance waveform. IMPRESSION: Right: Heterogeneous and partially calcified plaque at the right carotid bifurcation contributes to 50%-69% stenosis by established duplex criteria. Left: Color duplex indicates minimal heterogeneous and calcified plaque, with no hemodynamically significant stenosis by duplex criteria in the extracranial cerebrovascular circulation. Signed, Dulcy Fanny. Earleen Newport, DO [facility] Electronically Signed   By: Corrie Mckusick D.O.   On: 02/23/2018 14:10   Mr Jodene Nam Head/brain YS Cm  Result Date: 02/23/2018 CLINICAL DATA:  Focal neuro deficit.  Found unresponsive. EXAM: MRI HEAD WITHOUT CONTRAST MRA HEAD WITHOUT CONTRAST TECHNIQUE: Multiplanar, multiecho pulse sequences of the brain and surrounding structures were obtained without intravenous contrast. Angiographic images of the head were obtained using MRA technique without contrast. COMPARISON:  CT head 02/22/2018.  MRI head 02/04/2017 FINDINGS: MRI HEAD FINDINGS Brain: Image quality degraded by motion Generalized atrophy with mild chronic microvascular ischemic changes in the white matter. Acute infarct left PCA territory. Acute infarct in the left medial temporal lobe, left hippocampus, and left thalamus. Small  area of acute infarct in the left occipital  lobe. Negative for hemorrhage or mass. No midline shift. Vascular: Normal arterial flow voids Skull and upper cervical spine: Negative Sinuses/Orbits: Mucosal edema paranasal sinuses. Bilateral cataract removal Other: None MRA HEAD FINDINGS Image quality degraded by motion. Limited information is available on the study. Both vertebral arteries are patent and the basilar is patent. Posterior cerebral arteries not well visualized. Both internal carotid arteries are patent with disease in the cavernous segment. Anterior and middle cerebral arteries not well seen bilaterally, most likely due to motion. 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 MRA degraded by significant motion with limited diagnostic information. Electronically Signed   By: Franchot Gallo M.D.   On: 02/23/2018 18:42   Ct Head Code Stroke Wo Contrast  Result Date: 02/22/2018 CLINICAL DATA:  Code stroke. RIGHT visual loss, RIGHT-sided weakness. On Eliquis. History of hypertension, migraine, hypercholesterolemia, diabetes. EXAM: CT HEAD WITHOUT CONTRAST TECHNIQUE: Contiguous axial images were obtained from the base of the skull through the vertex without intravenous contrast. COMPARISON:  MRI of the head January 04, 2017 FINDINGS: BRAIN: No intraparenchymal hemorrhage, mass effect nor midline shift. The ventricles and sulci are normal for age. Patchy supratentorial white matter hypodensities less than expected for patient's age, though non-specific are most compatible with chronic small vessel ischemic disease. No acute large vascular territory infarcts. No abnormal extra-axial fluid collections. Basal cisterns are patent. VASCULAR: Moderate calcific atherosclerosis of the carotid siphons. SKULL: No skull fracture. No significant scalp soft tissue swelling. SINUSES/ORBITS: Minimal paranasal sinus mucosal thickening. Mastoid air cells are well aerated.The included ocular globes and orbital contents  are non-suspicious. OTHER: None. ASPECTS [facility] Stroke Program Early CT Score) - Ganglionic level infarction (caudate, lentiform nuclei, internal capsule, insula, M1-M3 cortex): 7 - Supraganglionic infarction (M4-M6 cortex): 3 Total score (0-10 with 10 being normal): 10 IMPRESSION: 1. Negative noncontrast CT HEAD for age. 2. ASPECTS is 10. 3. Critical Value/emergent results were called by telephone at the time of interpretation on 02/22/2018 at 7:45 pm to Dr. Charlotte Crumb , who verbally acknowledged these results. Electronically Signed   By: Elon Alas M.D.   On: 02/22/2018 19:46   ASSESSMENT AND PLAN:   Kyle Richardson  is a 84 y.o. male who presents after being found unresponsive outside by family.  When patient came to he stated that he had a visual disturbance, that he could not see on the right side.  Patient is also confused  #Acute CVA--left PCA territory non hemorrhagic stroke qappears emboli -CT had negative -patient awake alert oriented times two -on eliquis, statins Lipid profile OK -Echo EF 55-60% Tele in NSR -seen by Neurology  #  Right homonymous hemianopsia -as well as some right upper extremity weakness, -neurology consultation placed -patient already on eliquis for a fib -MRA/MRI of the brain pending -PT OT speech to see patient--pt will need intense rehab  #  Type 2 diabetes mellitus with complication, without long-term current use of insulin (HCC) -sliding scale insulin with corresponding glucose checks -not on any med at home -will start po metformin XR qd  #  Hypertension -permissive hypertension for 24 hours, blood pressure goal less than 220/120, hold antihypertensives for now -PRN hydralazine  #  CAD (coronary artery disease) -continue home meds  # PAF (paroxysmal atrial fibrillation) (HCC)  -home rate controlling medications and oral anticoagulation - eliquis    #Hypercholesterolemia -home dose statin -lipid profile OK  #h/o Myasthenia  Gravis Resume Mestinon 30 mg bid (sees Dr Mhoon at [facility])--- if tolerate is increased to  60 mg BID----then 60 mg TID per neurology note from 02/21/2018  Case discussed with Care Management/Social Worker. Management plans discussed with the patient, family and they are in agreement.  CODE STATUS: full  DVT Prophylaxis: elquis  TOTAL TIME TAKING CARE OF THIS PATIENT: *32 minutes.  >50% time spent on counselling and coordination of care  POSSIBLE D/C IN *1-2* DAYS, DEPENDING ON CLINICAL CONDITION.  Note: This dictation was prepared with Dragon dictation along with smaller phrase technology. Any transcriptional errors that result from this process are unintentional.  Fritzi Mandes M.D on 02/24/2018 at 9:07 AM  Between 7am to 6pm - Pager - [phone]  After 6pm go to [email or web address] - password [facility]  Office  [phone]  CC: Primary care physician; Einar Pheasant, MDPatient ID: [name], male   DOB: 1927-11-01, 84 y.o.   MRN: [id number]

## 2018-02-24 NOTE — Progress Notes (Signed)
Room air. Pt neglects R side. Pt has not showed signs of pain. Takes meds whole in apple sauce. Family at the bedside.Pt has no further concerns at this time.

## 2018-02-24 NOTE — Plan of Care (Signed)
Pt was given ativan later in day shift for MRI and so pt was very lethargic at the beginning of shift. Pt was not following commands very well so pt NIH was high. Pt start getting a little more alert at 0400 reassessment but still not following commands very well. No other signs of distress noted. Will continue to monitor.

## 2018-02-24 NOTE — Progress Notes (Addendum)
Physical Therapy Treatment Patient Details Name: Kyle Richardson MRN: [id number] DOB: October 24, 1927 Today's Date: 02/24/2018    History of Present Illness Pt admitted for unresponsive episode, found outside by family. History includes CAD, DM, glaucoma, and HTN. Pt pending MRI, although CT negative for acute CVA. MRI confirms Positive L PCA CVA.    PT Comments    Pt is making progress towards goals with ability to tolerate slightly increased therapy intensity today. Pt with increased strength in R LE this date, however R UE appears to be extremely weak and limited. Cognition worse this date with increased confusion and less interaction from pt, however still able to follow 75% of commands and verbal cues. R side neglect noted, education given to family on positioning. Vision appears slightly worse today with decreased ability to find glove/outstretched hand. Will continue to progress. May benefit from platform RW vs HW for further OOB mobility.  Follow Up Recommendations  CIR     Equipment Recommendations  (TBD)    Recommendations for Other Services OT consult;Rehab consult     Precautions / Restrictions Precautions Precautions: Fall Restrictions Weight Bearing Restrictions: No    Mobility  Bed Mobility Overal bed mobility: Needs Assistance Bed Mobility: Supine to Sit     Supine to sit: Max assist     General bed mobility comments: needs increased assist for sitting on L side of bed. Once seated, multiple weight shifts noted and pt able to sit with upright posture and decreasing to cga.   Transfers Overall transfer level: Needs assistance Equipment used: None Transfers: Sit to/from Stand Sit to Stand: Max assist         General transfer comment: 3 standing trials noted without AD as pt unable to hold onto RW with R hand. Would benefit from different AD. Pt able to use L hemibody and collapses on R side with lateral trunk flexion. Only able to stand for approx 10-15 seconds  prior to fatigue  Ambulation/Gait             General Gait Details: not appropriate   Stairs             Wheelchair Mobility    Modified Rankin (Stroke Patients Only)       Balance                                            Cognition Arousal/Alertness: Awake/alert Behavior During Therapy: Flat affect Overall Cognitive Status: Impaired/Different from baseline                                        Exercises Other Exercises Other Exercises: supine ther-ex on R UE including shoulder flexion/elbow flexion x 5 reps. All with max assist. Further ther-ex performed on B LE including SLRs, hip abd/add, and heel slides. Supervision with L LE and min assist on R LE. Has difficulty following commands on R LE and keeps knee in extended position. Other Exercises: Once seated at EOB, performs multiple weight shifts in ant/post direction. Also performs seated LAQ and reaching out with L hand in all planes. Has difficulty with vision tracking/scanning this date.    General Comments        Pertinent Vitals/Pain Pain Assessment: No/denies pain    Home Living  Prior Function            PT Goals (current goals can now be found in the care plan section) Acute Rehab PT Goals Patient Stated Goal: unable to state goals PT Goal Formulation: With patient Time For Goal Achievement: 03/09/18 Potential to Achieve Goals: Good Progress towards PT goals: Progressing toward goals    Frequency    7X/week      PT Plan Current plan remains appropriate    Co-evaluation              AM-PAC PT "6 Clicks" Daily Activity  Outcome Measure  Difficulty turning over in bed (including adjusting bedclothes, sheets and blankets)?: Unable Difficulty moving from lying on back to sitting on the side of the bed? : Unable Difficulty sitting down on and standing up from a chair with arms (e.g., wheelchair, bedside commode,  etc,.)?: Unable Help needed moving to and from a bed to chair (including a wheelchair)?: Total Help needed walking in hospital room?: Total Help needed climbing 3-5 steps with a railing? : Total 6 Click Score: 6    End of Session Equipment Utilized During Treatment: Gait belt Activity Tolerance: Patient tolerated treatment well Patient left: in bed;with bed alarm set Nurse Communication: Mobility status PT Visit Diagnosis: Muscle weakness (generalized) (M62.81);History of falling (Z91.81);Difficulty in walking, not elsewhere classified (R26.2);Hemiplegia and hemiparesis Hemiplegia - Right/Left: Right Hemiplegia - dominant/non-dominant: Dominant Hemiplegia - caused by: Cerebral infarction     Time: 4268-3419 PT Time Calculation (min) (ACUTE ONLY): 25 min  Charges:  $Therapeutic Exercise: 8-22 mins $Therapeutic Activity: 8-22 mins                    G Codes:       Kyle Richardson, PT, DPT [phone]    Kyle Richardson 02/24/2018, 9:50 AM

## 2018-02-24 NOTE — Progress Notes (Addendum)
Subjective: No new neurological complaints.    Objective: Current vital signs: BP 129/76 (BP Location: Right Arm)   Pulse 69   Temp 97.6 F (36.4 C) (Oral)   Resp 20   Ht 5\' 8"  (1.727 m)   Wt 79.4 kg (175 lb)   SpO2 95%   BMI 26.61 kg/m  Vital signs in last 24 hours: Temp:  [97.6 F (36.4 C)-99.6 F (37.6 C)] 97.6 F (36.4 C) (05/19 1110) Pulse Rate:  [65-72] 69 (05/19 1110) Resp:  [16-20] 20 (05/19 1110) BP: (119-179)/(66-80) 129/76 (05/19 1110) SpO2:  [93 %-95 %] 95 % (05/19 1110)  Intake/Output from previous day: 05/18 0701 - 05/19 0700 In: 120 [P.O.:120] Out: 1225 [Urine:1225] Intake/Output this shift: Total I/O In: -  Out: 250 [Urine:250] Nutritional status:  Diet Order           Diet heart healthy/carb modified Room service appropriate? Yes; Fluid consistency: Thin  Diet effective now          Neurologic Exam: Mental Status: Alert, oriented, thought content appropriate.  Speech fluent without evidence of aphasia.  Able to follow 3 step commands without difficulty.  Right neglect Cranial Nerves: II: Discs flat bilaterally; RHH, pupils equal, round, reactive to light and accommodation III,IV, VI: ptosis not present, extra-ocular motions intact bilaterally V,VII: smile symmetric, facial light touch sensation normal bilaterally VIII: hearing normal bilaterally IX,X: gag reflex present XI: bilateral shoulder shrug XII: midline tongue extension Motor: Lifts RUE to about 90 degrees. Unable to figure out how to make a fist on command.  RLE strength unchanged.  Sensory: Pinprick and light touch intact throughout, bilaterally  Lab Results: Basic Metabolic Panel: Recent Labs  Lab 02/22/18 1950  NA 138  K 3.8  CL 106  CO2 24  GLUCOSE 147*  BUN 18  CREATININE 0.95  CALCIUM 8.6*    Liver Function Tests: Recent Labs  Lab 02/22/18 1950  AST 37  ALT 36  ALKPHOS 69  BILITOT 0.8  PROT 6.3*  ALBUMIN 3.6   No results for input(s): LIPASE, AMYLASE in  the last 168 hours. No results for input(s): AMMONIA in the last 168 hours.  CBC: Recent Labs  Lab 02/22/18 1950  WBC 5.6  NEUTROABS 3.4  HGB 14.1  HCT 40.2  MCV 95.3  PLT 260    Cardiac Enzymes: Recent Labs  Lab 02/22/18 1950  TROPONINI <0.03    Lipid Panel: Recent Labs  Lab 02/23/18 0354  CHOL 167  TRIG 144  HDL 43  CHOLHDL 3.9  VLDL 29  LDLCALC 95    CBG: Recent Labs  Lab 02/23/18 0830 02/23/18 1236 02/23/18 2145 02/24/18 0741 02/24/18 1158  GLUCAP 144* 164* 181* 151* 139*    Microbiology: Results for orders placed or performed in visit on 01/28/18  Urine Culture     Status: None   Collection Time: 01/28/18  8:36 AM  Result Value Ref Range Status   MICRO NUMBER: [id number]  Final   SPECIMEN QUALITY: ADEQUATE  Final   Sample Source URINE  Final   STATUS: FINAL  Final   Result: No Growth  Final    Coagulation Studies: Recent Labs    02/22/18 1950  LABPROT 14.3  INR 1.12    Imaging: Mr Brain Wo Contrast  Result Date: 02/23/2018 CLINICAL DATA:  Focal neuro deficit.  Found unresponsive. EXAM: MRI HEAD WITHOUT CONTRAST MRA HEAD WITHOUT CONTRAST TECHNIQUE: Multiplanar, multiecho pulse sequences of the brain and surrounding structures were obtained without intravenous contrast. Angiographic  images of the head were obtained using MRA technique without contrast. COMPARISON:  CT head 02/22/2018.  MRI head 02/04/2017 FINDINGS: MRI HEAD FINDINGS Brain: Image quality degraded by motion Generalized atrophy with mild chronic microvascular ischemic changes in the white matter. Acute infarct left PCA territory. Acute infarct in the left medial temporal lobe, left hippocampus, and left thalamus. Small area of acute infarct in the left occipital lobe. Negative for hemorrhage or mass. No midline shift. Vascular: Normal arterial flow voids Skull and upper cervical spine: Negative Sinuses/Orbits: Mucosal edema paranasal sinuses. Bilateral cataract removal Other: None MRA  HEAD FINDINGS Image quality degraded by motion. Limited information is available on the study. Both vertebral arteries are patent and the basilar is patent. Posterior cerebral arteries not well visualized. Both internal carotid arteries are patent with disease in the cavernous segment. Anterior and middle cerebral arteries not well seen bilaterally, most likely due to motion. 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 MRA degraded by significant motion with limited diagnostic information. Electronically Signed   By: Franchot Gallo M.D.   On: 02/23/2018 18:42   US Carotid Bilateral (at [facility] And [facility] Only)  Result Date: 02/23/2018 CLINICAL DATA:  84 year old male with a history of unresponsiveness. Cardiovascular risk factors include hypertension, coronary disease, hyperlipidemia, diabetes EXAM: BILATERAL CAROTID DUPLEX ULTRASOUND TECHNIQUE: Pearline Cables scale imaging, color Doppler and duplex ultrasound were performed of bilateral carotid and vertebral arteries in the neck. COMPARISON:  09/10/2015 FINDINGS: Criteria: Quantification of carotid stenosis is based on velocity parameters that correlate the residual internal carotid diameter with NASCET-based stenosis levels, using the diameter of the distal internal carotid lumen as the denominator for stenosis measurement. The following velocity measurements were obtained: RIGHT ICA:  Systolic 751 cm/sec, Diastolic 43 cm/sec CCA:  700 cm/sec SYSTOLIC ICA/CCA RATIO:  2.0 ECA:  109 cm/sec LEFT ICA:  Systolic 174 cm/sec, Diastolic 13 cm/sec CCA:  90 cm/sec SYSTOLIC ICA/CCA RATIO:  1.4 ECA:  229 cm/sec Right Brachial SBP: Not acquired Left Brachial SBP: Not acquired RIGHT CAROTID ARTERY: No significant calcifications of the right common carotid artery. Intermediate waveform maintained. Heterogeneous and partially calcified plaque at the right carotid bifurcation. No significant lumen shadowing. Low resistance waveform of the right ICA. No significant tortuosity. RIGHT  VERTEBRAL ARTERY: Antegrade flow with low resistance waveform. LEFT CAROTID ARTERY: No significant calcifications of the left common carotid artery. Intermediate waveform maintained. Heterogeneous and partially calcified plaque at the left carotid bifurcation without significant lumen shadowing. Low resistance waveform of the left ICA. No significant tortuosity. LEFT VERTEBRAL ARTERY:  Antegrade flow with low resistance waveform. IMPRESSION: Right: Heterogeneous and partially calcified plaque at the right carotid bifurcation contributes to 50%-69% stenosis by established duplex criteria. Left: Color duplex indicates minimal heterogeneous and calcified plaque, with no hemodynamically significant stenosis by duplex criteria in the extracranial cerebrovascular circulation. Signed, Dulcy Fanny. Earleen Newport, DO [facility]iology Electronically Signed   By: Corrie Mckusick D.O.   On: 02/23/2018 14:10   Mr Jodene Nam Head/brain BS Cm  Result Date: 02/23/2018 CLINICAL DATA:  Focal neuro deficit.  Found unresponsive. EXAM: MRI HEAD WITHOUT CONTRAST MRA HEAD WITHOUT CONTRAST TECHNIQUE: Multiplanar, multiecho pulse sequences of the brain and surrounding structures were obtained without intravenous contrast. Angiographic images of the head were obtained using MRA technique without contrast. COMPARISON:  CT head 02/22/2018.  MRI head 02/04/2017 FINDINGS: MRI HEAD FINDINGS Brain: Image quality degraded by motion Generalized atrophy with mild chronic microvascular ischemic changes in the white matter. Acute  infarct left PCA territory. Acute infarct in the left medial temporal lobe, left hippocampus, and left thalamus. Small area of acute infarct in the left occipital lobe. Negative for hemorrhage or mass. No midline shift. Vascular: Normal arterial flow voids Skull and upper cervical spine: Negative Sinuses/Orbits: Mucosal edema paranasal sinuses. Bilateral cataract removal Other: None MRA  HEAD FINDINGS Image quality degraded by motion. Limited information is available on the study. Both vertebral arteries are patent and the basilar is patent. Posterior cerebral arteries not well visualized. Both internal carotid arteries are patent with disease in the cavernous segment. Anterior and middle cerebral arteries not well seen bilaterally, most likely due to motion. 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 MRA degraded by significant motion with limited diagnostic information. Electronically Signed   By: Franchot Gallo M.D.   On: 02/23/2018 18:42   Ct Head Code Stroke Wo Contrast  Result Date: 02/22/2018 CLINICAL DATA:  Code stroke. RIGHT visual loss, RIGHT-sided weakness. On Eliquis. History of hypertension, migraine, hypercholesterolemia, diabetes. EXAM: CT HEAD WITHOUT CONTRAST TECHNIQUE: Contiguous axial images were obtained from the base of the skull through the vertex without intravenous contrast. COMPARISON:  MRI of the head January 04, 2017 FINDINGS: BRAIN: No intraparenchymal hemorrhage, mass effect nor midline shift. The ventricles and sulci are normal for age. Patchy supratentorial white matter hypodensities less than expected for patient's age, though non-specific are most compatible with chronic small vessel ischemic disease. No acute large vascular territory infarcts. No abnormal extra-axial fluid collections. Basal cisterns are patent. VASCULAR: Moderate calcific atherosclerosis of the carotid siphons. SKULL: No skull fracture. No significant scalp soft tissue swelling. SINUSES/ORBITS: Minimal paranasal sinus mucosal thickening. Mastoid air cells are well aerated.The included ocular globes and orbital contents are non-suspicious. OTHER: None. ASPECTS [facility] Stroke Program Early CT Score) - Ganglionic level infarction (caudate, lentiform nuclei, internal capsule, insula, M1-M3 cortex): 7 - Supraganglionic infarction (M4-M6 cortex): 3 Total score (0-10 with 10 being normal):  10 IMPRESSION: 1. Negative noncontrast CT HEAD for age. 2. ASPECTS is 10. 3. Critical Value/emergent results were called by telephone at the time of interpretation on 02/22/2018 at 7:45 pm to Dr. Charlotte Crumb , who verbally acknowledged these results. Electronically Signed   By: Elon Alas M.D.   On: 02/22/2018 19:46    Medications:  I have reviewed the patient's current medications. Scheduled: . apixaban  5 mg Oral BID  . atorvastatin  20 mg Oral Daily  . calcium citrate-vitamin D  1 tablet Oral Daily  . FLUoxetine  10 mg Oral Daily  . fluticasone  1 spray Each Nare Daily  . insulin aspart  0-5 Units Subcutaneous QHS  . insulin aspart  0-9 Units Subcutaneous TID WC  . latanoprost  1 drop Both Eyes QHS  . mouth rinse  15 mL Mouth Rinse BID  . [START ON 02/25/2018] metFORMIN  500 mg Oral Q breakfast  . metoprolol succinate  25 mg Oral Daily  . multivitamin with minerals  1 tablet Oral Daily  . multivitamin-lutein  1 capsule Oral BID  . pyridostigmine  30 mg Oral BID    Assessment/Plan: No new neurological complaints.  MRI of the brain reviewed and shows an acute left PCA infarct.  Likely secondary to embolic etiology.  Patient remains on Eliquis.  Echocardiogram shows no cardiac source of emboli with an EF of 60-65%.   Recommendations: 1. Continue Eliquis 2. Continue therapy  No further neurologic intervention is recommended at this time.  If further questions arise, please  call or page at that time.  Thank you for allowing neurology to participate in the care of this patient.   LOS: 0 days   Alexis Goodell, MD Neurology [phone] 02/24/2018  3:33 PM

## 2018-02-25 ENCOUNTER — Encounter: Payer: Self-pay | Admitting: *Deleted

## 2018-02-25 LAB — GLUCOSE, CAPILLARY
GLUCOSE-CAPILLARY: 151 mg/dL — AB (ref 65–99)
GLUCOSE-CAPILLARY: 149 mg/dL — AB (ref 65–99)
Glucose-Capillary: 141 mg/dL — ABNORMAL HIGH (ref 65–99)
Glucose-Capillary: 130 mg/dL — ABNORMAL HIGH (ref 65–99)
Glucose-Capillary: 147 mg/dL — ABNORMAL HIGH (ref 65–99)

## 2018-02-25 LAB — CBC
HEMATOCRIT: 44.5 % (ref 40.0–52.0)
Hemoglobin: 15.2 g/dL (ref 13.0–18.0)
MCH: 32.6 pg (ref 26.0–34.0)
MCHC: 34.2 g/dL (ref 32.0–36.0)
MCV: 95.4 fL (ref 80.0–100.0)
PLATELETS: 210 10*3/uL (ref 150–440)
RBC: 4.67 MIL/uL (ref 4.40–5.90)
RDW: 13.7 % (ref 11.5–14.5)
WBC: 9 10*3/uL (ref 3.8–10.6)

## 2018-02-25 LAB — CREATININE, SERUM: Creatinine, Ser: 0.85 mg/dL (ref 0.61–1.24)

## 2018-02-25 NOTE — Evaluation (Signed)
Occupational Therapy Evaluation Patient Details Name: Kyle Richardson MRN: [id number] DOB: 12/09/1927 Today's Date: 02/25/2018    History of Present Illness Pt admitted for unresponsive episode, found outside by family. History includes CAD, DM, glaucoma, and HTN. Pt pending MRI, although CT negative for acute CVA. MRI confirms Positive L PCA CVA.   Clinical Impression   Pt seen for OT evaluation this date. Pt lives in a 1 story home with 3 steps to enter with bilateral handrails, walk-in shower with a shower chair (spouse uses) and grab bar. He lives with his spouse (she uses a RW) and he loves to garden. Pt was independent with mobility, ADL, and IADL prior to admission, including driving with no falls reported in past 12 months, per pt and son in law who was present for evaluation. Pt currently presents with R hemibody, R visual field deficits (at least 50% vision loss on R side in B eyes) and R side neglect which significantly impacts his functional mobility, safety, and ability to perform ADL and IADL tasks at [facility]. Pt/family educated in symptoms and compensatory strategies and room set up for homonymous hemianopsia including visual tracking and visual scanning exercises. Pt pleasant and put forth excellent effort t/o evaluation. Noted improvement with attending to R side with repetition and cues for turning head and eyes towards R side. Max assist x2 for mobility with RN. Pt is far from his baseline independence secondary to this CVA. Pt will benefit from skilled OT services to maximize return to PLOF, minimize falls risk, and minimize caregiver burden. Recommend in-patient acute rehab services for aggressive therapy.     Follow Up Recommendations  CIR    Equipment Recommendations  Other (comment)(TBD)    Recommendations for Other Services Rehab consult     Precautions / Restrictions Precautions Precautions: Fall Restrictions Weight Bearing Restrictions: No      Mobility Bed  Mobility Overal bed mobility: Needs Assistance Bed Mobility: Sit to Supine     Supine to sit: Max assist;+2 for safety/equipment Sit to supine: Max assist;+2 for physical assistance   General bed mobility comments: needs assist for B LEs and trunk posture. Tends to lean towards R side, however able to maintain upright posture for brief time period with supervision.  Transfers Overall transfer level: Needs assistance Equipment used: None Transfers: Stand Pivot Transfers;Sit to/from Stand Sit to Stand: Max assist;+2 physical assistance Stand pivot transfers: Max assist;+2 physical assistance       General transfer comment: R knee blocked, +2 assistm no AD to protect RLE and ankle    Balance Overall balance assessment: Needs assistance Sitting-balance support: Feet supported;Single extremity supported Sitting balance-Leahy Scale: Fair   Postural control: Right lateral lean(mild, corrects with verbal/tactile cues) Standing balance support: Bilateral upper extremity supported Standing balance-Leahy Scale: Poor                             ADL either performed or assessed with clinical judgement   ADL Overall ADL's : Needs assistance/impaired Eating/Feeding: Maximal assistance;Sitting Eating/Feeding Details (indicate cue type and reason): son reports he fed the pt this am but that pt was able to hold a muffin and eat with his L hand Grooming: Wash/dry face;Sitting;Moderate assistance Grooming Details (indicate cue type and reason): VC for RUE involvement, mod assist to support RUE in task to wash face with a washcloth, no cues for washing both sides of face Upper Body Bathing: Sitting;Moderate assistance   Lower Body Bathing:  Maximal assistance;Sit to/from stand Lower Body Bathing Details (indicate cue type and reason): +2 to stand Upper Body Dressing : Sitting;Moderate assistance   Lower Body Dressing: Sit to/from stand;Maximal assistance Lower Body Dressing Details  (indicate cue type and reason): +2 to stand Toilet Transfer: +2 for physical assistance;Maximal assistance;BSC;Stand-pivot                   Vision Baseline Vision/History: Wears glasses;Glaucoma Wears Glasses: At all times Patient Visual Report: Other (comment)(decreased vision - difficult to describe) Vision Assessment?: Yes Eye Alignment: Within Functional Limits Ocular Range of Motion: Impaired-to be further tested in functional context(requires verbal cues to turn eyes to R ) Alignment/Gaze Preference: Within Defined Limits Tracking/Visual Pursuits: Requires cues, head turns, or add eye shifts to track(cues, head turns and eye shifts to R side) Visual Fields: Right homonymous hemianopsia     Perception     Praxis      Pertinent Vitals/Pain Pain Assessment: No/denies pain     Hand Dominance Right   Extremity/Trunk Assessment Upper Extremity Assessment Upper Extremity Assessment: RUE deficits/detail(LUE grossly 4+/5) RUE Deficits / Details: R grip at least 4/5, able to spontaneously raise arm off chair to scratch face, difficulty with performing when instructed; increased flexor tone at times RUE Coordination: decreased gross motor;decreased fine motor   Lower Extremity Assessment Lower Extremity Assessment: RLE deficits/detail;Defer to PT evaluation RLE Sensation: decreased light touch RLE Coordination: decreased gross motor   Cervical / Trunk Assessment Cervical / Trunk Assessment: Normal   Communication Communication Communication: HOH(bilat hearing aids)   Cognition Arousal/Alertness: Awake/alert Behavior During Therapy: WFL for tasks assessed/performed;Flat affect(appropriately smiled a few times during session) Overall Cognitive Status: Impaired/Different from baseline Area of Impairment: Safety/judgement                         Safety/Judgement: Decreased awareness of deficits     General Comments: R side neglect but with cues improves his  ability turn attend to R side during session   General Comments       Exercises Other Exercises Other Exercises: pt/family instructed in [facility] and inattention and strategies to support increased attention to R side during the day Other Exercises: visual tracking/scanning activity requiring max verbal cues to follow OT around R side with head turns once cued to do so   Shoulder Instructions      Home Living Family/patient expects to be discharged to:: Private residence Living Arrangements: Spouse/significant other Available Help at Discharge: Family Type of Home: House Home Access: Stairs to enter Technical brewer of Steps: 2 Entrance Stairs-Rails: Can reach both Home Layout: One level     Bathroom Shower/Tub: Occupational psychologist: Standard     Home Equipment: Grab bars - tub/shower;Shower seat      Lives With: Spouse    Prior Functioning/Environment Level of Independence: Independent        Comments: Pt very independent prior to admission, driving, and loves to garden (vegetables, flowers)        OT Problem List: Impaired UE functional use;Decreased cognition;Impaired balance (sitting and/or standing);Decreased safety awareness;Decreased coordination;Decreased knowledge of use of DME or AE;Impaired vision/perception      OT Treatment/Interventions: Self-care/ADL training;Balance training;Therapeutic exercise;Neuromuscular education;Therapeutic activities;Cognitive remediation/compensation;Visual/perceptual remediation/compensation;DME and/or AE instruction;Patient/family education    OT Goals(Current goals can be found in the care plan section) Acute Rehab OT Goals Patient Stated Goal: unable to state goals OT Goal Formulation: With patient/family Time For Goal Achievement: 03/11/18  Potential to Achieve Goals: Good ADL Goals Pt Will Perform Eating: with set-up;sitting;with min assist(cues for attending to R side and incorporating RUE into task) Pt  Will Perform Grooming: sitting;with min assist;with set-up(cues for attending to R side and incorporating RUE into task) Pt Will Transfer to Toilet: with mod assist;with +2 assist;stand pivot transfer;bedside commode;with min assist Additional ADL Goal #1: Pt will turn head/eyes to the R side during a seated ADL tasks with <25% verbal cues to increase attention to R side.  OT Frequency: Min 3X/week   Barriers to D/C:            Co-evaluation              AM-PAC PT "6 Clicks" Daily Activity     Outcome Measure Help from another person eating meals?: A Lot Help from another person taking care of personal grooming?: A Lot Help from another person toileting, which includes using toliet, bedpan, or urinal?: A Lot Help from another person bathing (including washing, rinsing, drying)?: A Lot Help from another person to put on and taking off regular upper body clothing?: A Lot Help from another person to put on and taking off regular lower body clothing?: A Lot 6 Click Score: 12   End of Session Equipment Utilized During Treatment: Gait belt  Activity Tolerance: Patient tolerated treatment well Patient left: in bed;with call bell/phone within reach;with bed alarm set;with family/visitor present  OT Visit Diagnosis: Other abnormalities of gait and mobility (R26.89);Hemiplegia and hemiparesis;Other symptoms and signs involving cognitive function;Low vision, both eyes (H54.2) Hemiplegia - Right/Left: Right Hemiplegia - dominant/non-dominant: Dominant Hemiplegia - caused by: Cerebral infarction                Time: 3419-3790 OT Time Calculation (min): 45 min Charges:  OT General Charges $OT Visit: 1 Visit OT Evaluation $OT Eval High Complexity: 1 High OT Treatments $Neuromuscular Re-education: 8-22 mins  Jeni Salles, MPH, MS, OTR/L ascom [phone] 02/25/18, 10:52 AM

## 2018-02-25 NOTE — Progress Notes (Signed)
[facility] [place] NAME: Kyle Richardson    MR#:  [id number]  DATE OF BIRTH:  22-Jul-1928  SUBJECTIVE:  Out in the chair. Family in the room Right sided weakness+  REVIEW OF SYSTEMS:   Review of Systems  Constitutional: Positive for malaise/fatigue. Negative for chills, fever and weight loss.  HENT: Negative for ear discharge, ear pain and nosebleeds.   Eyes: Positive for blurred vision. Negative for pain and discharge.  Respiratory: Negative for sputum production, shortness of breath, wheezing and stridor.   Cardiovascular: Negative for chest pain, palpitations, orthopnea and PND.  Gastrointestinal: Negative for abdominal pain, diarrhea, nausea and vomiting.  Genitourinary: Negative for frequency and urgency.  Musculoskeletal: Negative for back pain and joint pain.  Neurological: Positive for focal weakness and weakness. Negative for sensory change and speech change.  Psychiatric/Behavioral: Negative for depression and hallucinations. The patient is not nervous/anxious.    Tolerating Diet:yes Tolerating PT: CIR  DRUG ALLERGIES:   Allergies  Allergen Reactions  . Clarithromycin Other (See Comments) and Diarrhea  . Codeine   . Flomax [Tamsulosin Hcl]   . Lamisil [Terbinafine]   . Levofloxacin Other (See Comments)    Diplopia. NEVER put patient on levoquin!  . Sertraline Other (See Comments)  . Sulfa Antibiotics     VITALS:  Blood pressure (!) 147/52, pulse (!) 57, temperature 98.7 F (37.1 C), temperature source Oral, resp. rate 17, height 5\' 8"  (1.727 m), weight 79.4 kg (175 lb), SpO2 93 %.  PHYSICAL EXAMINATION:   Physical Exam  GENERAL:  84 y.o.-year-old patient lying in the bed with no acute distress.  EYES: Pupils equal, round, reactive to light and accommodation. No scleral icterus. Patient has field of vision cut on the right side. Only statements HEENT: Head atraumatic, normocephalic. Oropharynx and nasopharynx  clear.  NECK:  Supple, no jugular venous distention. No thyroid enlargement, no tenderness.  LUNGS: Normal breath sounds bilaterally, no wheezing, rales, rhonchi. No use of accessory muscles of respiration.  CARDIOVASCULAR: S1, S2 normal. No murmurs, rubs, or gallops.  ABDOMEN: Soft, nontender, nondistended. Bowel sounds present. No organomegaly or mass.  EXTREMITIES: No cyanosis, clubbing or edema b/l.    NEUROLOGIC: Cranial nerves II through XII are intact. Right upper and lower extremity hemiparesis decreased sensation in both right and upper lower extremity  PSYCHIATRIC:  patient is alert and oriented x 3.  SKIN: No obvious rash, lesion, or ulcer.   LABORATORY PANEL:  CBC Recent Labs  Lab 02/25/18 0411  WBC 9.0  HGB 15.2  HCT 44.5  PLT 210    Chemistries  Recent Labs  Lab 02/22/18 1950 02/25/18 0411  NA 138  --   K 3.8  --   CL 106  --   CO2 24  --   GLUCOSE 147*  --   BUN 18  --   CREATININE 0.95 0.85  CALCIUM 8.6*  --   AST 37  --   ALT 36  --   ALKPHOS 69  --   BILITOT 0.8  --    Cardiac Enzymes Recent Labs  Lab 02/22/18 1950  TROPONINI <0.03   RADIOLOGY:  Mr Brain Wo Contrast  Result Date: 02/23/2018 CLINICAL DATA:  Focal neuro deficit.  Found unresponsive. EXAM: MRI HEAD WITHOUT CONTRAST MRA HEAD WITHOUT CONTRAST TECHNIQUE: Multiplanar, multiecho pulse sequences of the brain and surrounding structures were obtained without intravenous contrast. Angiographic images of the head were obtained using MRA technique without contrast. COMPARISON:  CT head 02/22/2018.  MRI head 02/04/2017 FINDINGS: MRI HEAD FINDINGS Brain: Image quality degraded by motion Generalized atrophy with mild chronic microvascular ischemic changes in the white matter. Acute infarct left PCA territory. Acute infarct in the left medial temporal lobe, left hippocampus, and left thalamus. Small area of acute infarct in the left occipital lobe. Negative for hemorrhage or mass. No midline shift.  Vascular: Normal arterial flow voids Skull and upper cervical spine: Negative Sinuses/Orbits: Mucosal edema paranasal sinuses. Bilateral cataract removal Other: None MRA HEAD FINDINGS Image quality degraded by motion. Limited information is available on the study. Both vertebral arteries are patent and the basilar is patent. Posterior cerebral arteries not well visualized. Both internal carotid arteries are patent with disease in the cavernous segment. Anterior and middle cerebral arteries not well seen bilaterally, most likely due to motion. 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 MRA degraded by significant motion with limited diagnostic information. Electronically Signed   By: Franchot Gallo M.D.   On: 02/23/2018 18:42   US Carotid Bilateral (at [facility] Only)  Result Date: 02/23/2018 CLINICAL DATA:  84 year old male with a history of unresponsiveness. Cardiovascular risk factors include hypertension, coronary disease, hyperlipidemia, diabetes EXAM: BILATERAL CAROTID DUPLEX ULTRASOUND TECHNIQUE: Pearline Cables scale imaging, color Doppler and duplex ultrasound were performed of bilateral carotid and vertebral arteries in the neck. COMPARISON:  09/10/2015 FINDINGS: Criteria: Quantification of carotid stenosis is based on velocity parameters that correlate the residual internal carotid diameter with NASCET-based stenosis levels, using the diameter of the distal internal carotid lumen as the denominator for stenosis measurement. The following velocity measurements were obtained: RIGHT ICA:  Systolic 891 cm/sec, Diastolic 43 cm/sec CCA:  694 cm/sec SYSTOLIC ICA/CCA RATIO:  2.0 ECA:  109 cm/sec LEFT ICA:  Systolic 503 cm/sec, Diastolic 13 cm/sec CCA:  90 cm/sec SYSTOLIC ICA/CCA RATIO:  1.4 ECA:  229 cm/sec Right Brachial SBP: Not acquired Left Brachial SBP: Not acquired RIGHT CAROTID ARTERY: No significant calcifications of the right common carotid artery. Intermediate waveform maintained. Heterogeneous  and partially calcified plaque at the right carotid bifurcation. No significant lumen shadowing. Low resistance waveform of the right ICA. No significant tortuosity. RIGHT VERTEBRAL ARTERY: Antegrade flow with low resistance waveform. LEFT CAROTID ARTERY: No significant calcifications of the left common carotid artery. Intermediate waveform maintained. Heterogeneous and partially calcified plaque at the left carotid bifurcation without significant lumen shadowing. Low resistance waveform of the left ICA. No significant tortuosity. LEFT VERTEBRAL ARTERY:  Antegrade flow with low resistance waveform. IMPRESSION: Right: Heterogeneous and partially calcified plaque at the right carotid bifurcation contributes to 50%-69% stenosis by established duplex criteria. Left: Color duplex indicates minimal heterogeneous and calcified plaque, with no hemodynamically significant stenosis by duplex criteria in the extracranial cerebrovascular circulation. Signed, Dulcy Fanny. Earleen Newport, DO [facility] Electronically Signed   By: Corrie Mckusick D.O.   On: 02/23/2018 14:10   Mr Jodene Nam Head/brain UU Cm  Result Date: 02/23/2018 CLINICAL DATA:  Focal neuro deficit.  Found unresponsive. EXAM: MRI HEAD WITHOUT CONTRAST MRA HEAD WITHOUT CONTRAST TECHNIQUE: Multiplanar, multiecho pulse sequences of the brain and surrounding structures were obtained without intravenous contrast. Angiographic images of the head were obtained using MRA technique without contrast. COMPARISON:  CT head 02/22/2018.  MRI head 02/04/2017 FINDINGS: MRI HEAD FINDINGS Brain: Image quality degraded by motion Generalized atrophy with mild chronic microvascular ischemic changes in the white matter. Acute infarct left PCA territory. Acute infarct in the left medial temporal lobe, left  hippocampus, and left thalamus. Small area of acute infarct in the left occipital lobe. Negative for hemorrhage or mass. No midline shift.  Vascular: Normal arterial flow voids Skull and upper cervical spine: Negative Sinuses/Orbits: Mucosal edema paranasal sinuses. Bilateral cataract removal Other: None MRA HEAD FINDINGS Image quality degraded by motion. Limited information is available on the study. Both vertebral arteries are patent and the basilar is patent. Posterior cerebral arteries not well visualized. Both internal carotid arteries are patent with disease in the cavernous segment. Anterior and middle cerebral arteries not well seen bilaterally, most likely due to motion. 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 MRA degraded by significant motion with limited diagnostic information. Electronically Signed   By: Franchot Gallo M.D.   On: 02/23/2018 18:42   ASSESSMENT AND PLAN:   Brittian Renaldo  is a 84 y.o. male who presents after being found unresponsive outside by family.  When patient came to he stated that he had a visual disturbance, that he could not see on the right side.  Patient is also confused  #Acute CVA--left PCA territory non hemorrhagic stroke appears embolic -CT had negative -patient awake alert oriented times two -on eliquis, statins Lipid profile OK -Echo EF 55-60%.. No cardiac source of emboli noted -Tele in NSR -seen by Neurology  #  Right homonymous hemianopsia -as well as some right upper extremity weakness, -neurology consultation placed -patient already on eliquis for a fib -MRA/MRI of the brain showed left PCA infarct -PT OT speech to see patient--pt will need intense rehab  #  Type 2 diabetes mellitus with complication, without long-term current use of insulin (HCC) -sliding scale insulin with corresponding glucose checks -not on any med at home -will start po metformin XR qd  #  Hypertension -permissive hypertension for 24 hours, blood pressure goal less than 220/120, hold antihypertensives for now -PRN hydralazine  #  CAD (coronary artery disease) -continue home meds  # PAF  (paroxysmal atrial fibrillation) ([place])  -home rate controlling medications and oral anticoagulation - eliquis    #Hypercholesterolemia -home dose statin -lipid profile OK  #h/o Myasthenia Gravis Resume Mestinon 30 mg bid (sees Dr Ernst Bowler at [facility])--- if tolerate is increased to 60 mg BID----then 60 mg TID per neurology note from 02/21/2018  CM/CSW for d/c planning. Pt is medically ok for discharge  Case discussed with Care Management/Social Worker. Management plans discussed with the patient, family and they are in agreement.  CODE STATUS: full  DVT Prophylaxis: elquis  TOTAL TIME TAKING CARE OF THIS PATIENT: *32 minutes.  >50% time spent on counselling and coordination of care  POSSIBLE D/C IN *1-2* DAYS, DEPENDING ON CLINICAL CONDITION.  Note: This dictation was prepared with Dragon dictation along with smaller phrase technology. Any transcriptional errors that result from this process are unintentional.  Fritzi Mandes M.D on 02/25/2018 at 9:39 AM  Between 7am to 6pm - Pager - [phone]  After 6pm go to [email or web address] - password [facility]  Office  [phone]  CC: Primary care physician; Einar Pheasant, MDPatient ID: [name], male   DOB: 26-Mar-1928, 84 y.o.   MRN: [id number]

## 2018-02-25 NOTE — Progress Notes (Addendum)
  Speech Language Pathology Treatment: Cognitive-Linquistic  Patient Details Name: Kyle Richardson MRN: [id number] DOB: 1927/10/21 Today's Date: 02/25/2018 Time: 1430-1530 SLP Time Calculation (min) (ACUTE ONLY): 60 min  Assessment / Plan / Recommendation Clinical Impression  Pt seen today for ongoing assessment and treatment targeting Expressive Aphasia, Receptive Aphasia as well as education w/ family on strategies to encourage language during ADLs. Also gave education on Aphasia in general and its impact on ADLs. Encouraged sitting on pt's R side to encourage head turn and attend to that side.  Pt exhibits moderate-severe expressive deficits but is able to follow Automatic speech tasks then w/ model, he can repeat object names when presented the object. Accuracy increased when SLP moved directly from the automatic speech tasks to the object naming. Carryover did not continue past ~3-4 objects being named. Pt's accuracy during object naming did not increase when given phonemic and semantic cues; performing object function and given verbal cues did not increase accuracy either. Pt was able to demonstrate object function of 3/5 objects when given tactile cues; carryover w/ demonstration of object function upon returning to the task was 1/5 for acc.  Pt's speech is c/b jargon, more of a Wernecke's Aphasia - he was not fully aware of the jargon/errors. However, he did appear somewhat frustrated when unable to name an object during tasks responding w/ "I don't know that name". Perseveration(speech, object names) was noted b/t tasks. He was able to complete Repetition tasks w/ 4/10 acc. Strongly encouraged the family to continue w/ Automatic Speech tasks including Days/Months/Counting/ABCs and also use Singing, favorite poem or prayer; Handouts posted and given. Encouraged sitting on pt's R side for stimulation to that side secondary to Neglect and visual field loss - present objects Midline first then move to  his R slightly. Always first address pt from his Left side for awareness. Family education; agreed w/ POC.  Recommend skilled ST Services continue to follow pt - pt would benefit from skilled therapy in the Inpatient Rehab Setting to address communication skills for ADLs.  CM/NSG updated.    HPI HPI: Pt admitted for unresponsive episode, found outside on ground by family. History includes CAD, DM, glaucoma, and HTN. Pt pending MRI, although CT negative for acute CVA. MRI confirms Positive L PCA CVA. OT/PT following. Family and NSG deny any swallowing difficulties w/ regular diet.       SLP Plan  Continue with current plan of care       Recommendations  Diet recommendations: Regular;Thin liquid Liquids provided via: Cup;Straw Medication Administration: Whole meds with puree(as needed for safer, easier swallowing) Supervision: Patient able to self feed;Intermittent supervision to cue for compensatory strategies(tray setup d/t RUE weakness; vision deficits in R eye) Compensations: Minimize environmental distractions;Slow rate;Small sips/bites;Lingual sweep for clearance of pocketing;Multiple dry swallows after each bite/sip;Follow solids with liquid Postural Changes and/or Swallow Maneuvers: Seated upright 90 degrees;Upright 30-60 min after meal                General recommendations: (OT/PT following) Oral Care Recommendations: Oral care BID;Staff/trained caregiver to provide oral care;Patient independent with oral care Follow up Recommendations: Inpatient Rehab SLP Visit Diagnosis: Aphasia (R47.01);Cognitive communication deficit (D22.025) Plan: Continue with current plan of care       Kiefer, [employer], CCC-SLP Caliya Narine 02/25/2018, 4:09 PM

## 2018-02-25 NOTE — Care Management (Signed)
Spoke with wife, Kyle Richardson, at the bedside. States that she is in agreement for Inpatient acute Rehab. At [facility]. Discussed that Physical therapy and occupational therapy notes will need to be sent to [employer] for authorization, Will update [place] RN Admissions Coordinator at [place] Management [phone]

## 2018-02-25 NOTE — Progress Notes (Signed)
Physical Therapy Treatment Patient Details Name: Kyle Richardson MRN: [id number] DOB: 1928/10/07 Today's Date: 02/25/2018    History of Present Illness Pt admitted for unresponsive episode, found outside by family. History includes CAD, DM, glaucoma, and HTN. Pt pending MRI, although CT negative for acute CVA. MRI confirms Positive L PCA CVA.    PT Comments    Pt is making limited progress towards goals with ability to tolerate transfer to recliner this date with total assist +2. Unable to use platform RW effectively with R ankle inversion noted. KI donned for promoting R knee extension during standing attempts. Able to participate in B LE there-ex, following 50% of commands. Still unable to turn head past midline to R side with neglect noted. R UE weaker compared to R LE. Will continue to progress.   Follow Up Recommendations  CIR     Equipment Recommendations  (HW vs platform RW)    Recommendations for Other Services OT consult;Rehab consult     Precautions / Restrictions Precautions Precautions: Fall Restrictions Weight Bearing Restrictions: No    Mobility  Bed Mobility Overal bed mobility: Needs Assistance Bed Mobility: Supine to Sit     Supine to sit: Max assist;+2 for safety/equipment     General bed mobility comments: needs assist for B LEs and trunk posture. Tends to lean towards R side, however able to maintain upright posture for brief time period with supervision.  Transfers Overall transfer level: Needs assistance Equipment used: Right platform walker Transfers: Sit to/from Stand Sit to Stand: Max assist;+2 physical assistance         General transfer comment: standing trial with platform RW noted and KI donned secondary to R knee flexion despite cues. Needs +2 assist and for R arm to be strapped into platform-too weak to lift on RW and maintain grip. When standing, pt with inversion of R ankle, unable to correct and bear weight. Returned to sitting and  transfers performed to recliener with total assist +2 without AD to protect R LE and ankle  Ambulation/Gait             General Gait Details: not appropriate   Stairs             Wheelchair Mobility    Modified Rankin (Stroke Patients Only)       Balance                                            Cognition Arousal/Alertness: Awake/alert Behavior During Therapy: Flat affect Overall Cognitive Status: Impaired/Different from baseline                                        Exercises Other Exercises Other Exercises: attempted supine ther-ex on R LE including SLRs and heel slides. Needs mod assist for performance and has difficulty following commands with heavy R side neglect present. Also performed ther-ex on L LE. PROM of R UE shoulder flexion and elbow flexion performed x 5 reps and positioned on pillows while in chair.     General Comments        Pertinent Vitals/Pain Pain Assessment: No/denies pain    Home Living                      Prior Function  PT Goals (current goals can now be found in the care plan section) Acute Rehab PT Goals Patient Stated Goal: unable to state goals PT Goal Formulation: With patient Time For Goal Achievement: 03/09/18 Potential to Achieve Goals: Fair Progress towards PT goals: Progressing toward goals    Frequency    7X/week      PT Plan Current plan remains appropriate    Co-evaluation              AM-PAC PT "6 Clicks" Daily Activity  Outcome Measure  Difficulty turning over in bed (including adjusting bedclothes, sheets and blankets)?: Unable Difficulty moving from lying on back to sitting on the side of the bed? : Unable Difficulty sitting down on and standing up from a chair with arms (e.g., wheelchair, bedside commode, etc,.)?: Unable Help needed moving to and from a bed to chair (including a wheelchair)?: Total Help needed walking in hospital  room?: Total Help needed climbing 3-5 steps with a railing? : Total 6 Click Score: 6    End of Session Equipment Utilized During Treatment: Gait belt Activity Tolerance: Patient limited by fatigue Patient left: in chair;with chair alarm set;with family/visitor present Nurse Communication: Mobility status PT Visit Diagnosis: Muscle weakness (generalized) (M62.81);History of falling (Z91.81);Difficulty in walking, not elsewhere classified (R26.2);Hemiplegia and hemiparesis Hemiplegia - Right/Left: Right Hemiplegia - dominant/non-dominant: Dominant Hemiplegia - caused by: Cerebral infarction     Time: 0811-0842 PT Time Calculation (min) (ACUTE ONLY): 31 min  Charges:  $Therapeutic Exercise: 8-22 mins $Therapeutic Activity: 8-22 mins                    G Codes:       Kyle Richardson, PT, DPT [phone]    Kyle Richardson 02/25/2018, 10:07 AM

## 2018-02-25 NOTE — Care Management (Signed)
Spoke with Kyle Richardson and son-in-law at the bedside. Will need to speak with wife, [name], about going to inpatient acute rehabilitation at [facility]. Occupational therapy evaluation pending.  Shelbie Ammons RN MSN CCM Care Management [phone]

## 2018-02-26 ENCOUNTER — Encounter: Payer: Self-pay | Admitting: Internal Medicine

## 2018-02-26 LAB — GLUCOSE, CAPILLARY
GLUCOSE-CAPILLARY: 139 mg/dL — AB (ref 65–99)
Glucose-Capillary: 153 mg/dL — ABNORMAL HIGH (ref 65–99)
Glucose-Capillary: 121 mg/dL — ABNORMAL HIGH (ref 65–99)
Glucose-Capillary: 179 mg/dL — ABNORMAL HIGH (ref 65–99)

## 2018-02-26 MED ORDER — LORATADINE 10 MG PO TABS
10.0000 mg | ORAL_TABLET | Freq: Every day | ORAL | Status: DC
  Administered 2018-02-26 – 2018-02-27 (×2): 10 mg via ORAL
  Filled 2018-02-26 (×2): qty 1

## 2018-02-26 MED ORDER — ATORVASTATIN CALCIUM 20 MG PO TABS
40.0000 mg | ORAL_TABLET | Freq: Every day | ORAL | Status: DC
  Administered 2018-02-27: 11:00:00 40 mg via ORAL
  Filled 2018-02-26: qty 2

## 2018-02-26 NOTE — Progress Notes (Signed)
Physical Therapy Treatment Patient Details Name: Kyle Richardson MRN: [id number] DOB: [birth year] Today's Date: 02/26/2018    History of Present Illness Pt admitted for unresponsive episode, found outside by family. History includes CAD, DM, glaucoma, and HTN. Pt pending MRI, although CT negative for acute CVA. MRI confirms Positive L PCA CVA.    PT Comments    Participated in exercises as described below.  Pt with difficulty initiating rolling to L side to sit but was able to get LE's off bed with assist and then use rail to sit on EOB with mod/max a x 1.  Once sitting he was able to hold a seated position unsupported with only intermittent assist today.  Standing trials x 2 with R platform walker.  KI was not donned today as pt seemed to have fair knee control and it was not buckling during standing trials today.  Some hyperextension was noted though with increased weight bearing and was adjusted as necessary.  Ankle position also was improved in standing.  Feet braced to prevent sliding during standing.  Pt continues with R lean and needs physical assist to correct.  He was able to tolerate a longer standing time up to 2 minutes today each trial.  Stand pivot transfer to recliner +2 mod/max a as pt is unable to take steps with walker at this time.  Pt focused on counting through most of session so SLP recommendations were encouraged with counting and days of week.    CIR remains appropriate for pt upon discharge.  He remains motivated and is showing daily improvements with good endurance and tolerance for sessions.   Follow Up Recommendations  CIR     Equipment Recommendations       Recommendations for Other Services       Precautions / Restrictions Precautions Precautions: Fall Restrictions Weight Bearing Restrictions: No Other Position/Activity Restrictions: Watch R ankle during standing/transfers.    Mobility  Bed Mobility Overal bed mobility: Needs Assistance Bed Mobility: Sit  to Supine     Supine to sit: Mod assist;Max assist     General bed mobility comments: decreased assist for bed mobility today.  Transfers Overall transfer level: Needs assistance Equipment used: None Transfers: Stand Pivot Transfers;Sit to/from Stand Sit to Stand: Max assist;+2 physical assistance;Mod assist Stand pivot transfers: Mod assist;Max assist;+2 physical assistance       General transfer comment: No AD used for transfer  Ambulation/Gait             General Gait Details: not appropriate   Stairs             Wheelchair Mobility    Modified Rankin (Stroke Patients Only)       Balance Overall balance assessment: Needs assistance Sitting-balance support: Feet supported;Single extremity supported Sitting balance-Leahy Scale: Fair Sitting balance - Comments: able to sit for longer periods unsupported today   Standing balance support: Bilateral upper extremity supported Standing balance-Leahy Scale: Poor                              Cognition Arousal/Alertness: Awake/alert Behavior During Therapy: WFL for tasks assessed/performed Overall Cognitive Status: Impaired/Different from baseline Area of Impairment: Safety/judgement                                      Exercises Other Exercises Other Exercises:  supine RLE AAROM for  ankle pumps, heel slides, ab/add and saq.  unable to understand bridging cues x 10 Other Exercises: seated RLE LAQ and marches, LLE LAQ and marches x 10    General Comments        Pertinent Vitals/Pain Pain Assessment: No/denies pain    Home Living                      Prior Function            PT Goals (current goals can now be found in the care plan section) Progress towards PT goals: Progressing toward goals    Frequency    7X/week      PT Plan Current plan remains appropriate    Co-evaluation              AM-PAC PT "6 Clicks" Daily Activity  Outcome  Measure  Difficulty turning over in bed (including adjusting bedclothes, sheets and blankets)?: Unable Difficulty moving from lying on back to sitting on the side of the bed? : Unable Difficulty sitting down on and standing up from a chair with arms (e.g., wheelchair, bedside commode, etc,.)?: Unable Help needed moving to and from a bed to chair (including a wheelchair)?: Total Help needed walking in hospital room?: Total Help needed climbing 3-5 steps with a railing? : Total 6 Click Score: 6    End of Session Equipment Utilized During Treatment: Gait belt Activity Tolerance: Patient tolerated treatment well Patient left: in chair;with chair alarm set;with family/visitor present;with call bell/phone within reach   Hemiplegia - Right/Left: Right Hemiplegia - dominant/non-dominant: Dominant Hemiplegia - caused by: Cerebral infarction     Time: 3419-6222 PT Time Calculation (min) (ACUTE ONLY): 28 min  Charges:  $Therapeutic Exercise: 8-22 mins $Therapeutic Activity: 8-22 mins                    G Codes:       Chesley Noon, PTA 02/26/18, 12:35 PM

## 2018-02-26 NOTE — Progress Notes (Signed)
[facility] Admissions - I have received an initial denial from [facility] medical director for acute inpatient rehab admission.  I will discuss denial with rehab MD in am to see if rehab MD wants to pursue a peer to peer with medical director at insurance carrier.  Call me for questions.  #[phone]

## 2018-02-26 NOTE — NC FL2 (Signed)
[place] LEVEL OF CARE SCREENING TOOL     IDENTIFICATION  Patient Name: Kyle Richardson Birthdate: 1928/06/10 Sex: male Admission Date (Current Location): 02/22/2018  [place] and [place] Number:  Engineering geologist and Address:  [facility], [address] [place], [place] [address]      Provider Number: [id number]  Attending Physician Name and Address:  Fritzi Mandes, MD  Relative Name and Phone Number:       Current Level of Care: Hospital Recommended Level of Care: [place] Prior Approval Number:    Date Approved/Denied:   PASRR Number: [id number]  Discharge Plan: SNF    Current Diagnoses: Patient Active Problem List   Diagnosis Date Noted  . CVA (cerebral vascular accident) ([place]) 02/24/2018  . Right homonymous hemianopsia 02/23/2018  . Unresponsive episode 02/22/2018  . UTI (urinary tract infection) 01/28/2018  . Abnormal chest CT 09/20/2017  . Double vision 01/21/2017  . Ascending aortic aneurysm ([place]) 12/22/2016  . Blood-tinged sputum 10/23/2016  . Cough 10/23/2016  . PAF (paroxysmal atrial fibrillation) ([place]) 09/14/2016  . Health care maintenance 12/26/2014  . Stress 07/05/2014  . Migraine 05/10/2013  . Type 2 diabetes mellitus with complication, without long-term current use of insulin ([place]) 08/20/2012  . Hypertension 08/20/2012  . Hypercholesterolemia 08/20/2012  . CAD (coronary artery disease) 08/20/2012    Orientation RESPIRATION BLADDER Height & Weight     Self, Time, Situation, Place  Normal Incontinent Weight: 175 lb (79.4 kg) Height:  5\' 8"  (172.7 cm)  BEHAVIORAL SYMPTOMS/MOOD NEUROLOGICAL BOWEL NUTRITION STATUS  (None) (None) Incontinent Diet(Heart Healthy Carb modified)  AMBULATORY STATUS COMMUNICATION OF NEEDS Skin   Extensive Assist Verbally Normal                       Personal Care Assistance Level of Assistance  Bathing, Feeding, Dressing Bathing Assistance: Limited  assistance Feeding assistance: Independent Dressing Assistance: Limited assistance     Functional Limitations Info  Sight, Hearing, Speech Sight Info: Adequate Hearing Info: Adequate Speech Info: Adequate    SPECIAL CARE FACTORS FREQUENCY  PT (By licensed PT), OT (By licensed OT)     PT Frequency: 5x/week OT Frequency: 5x/week            Contractures Contractures Info: Not present    Additional Factors Info  Code Status, Allergies Code Status Info: Full Code  Allergies Info: Clarithromycin, Codeine, Flomax, Lamisil, Levofloxacin, Sertraline, Sulfa Antibiotics            Current Medications (02/26/2018):  This is the current hospital active medication list Current Facility-Administered Medications  Medication Dose Route Frequency Provider Last Rate Last Dose  . acetaminophen (TYLENOL) tablet 650 mg  650 mg Oral Q4H PRN Lance Coon, MD       Or  . acetaminophen (TYLENOL) solution 650 mg  650 mg Per Tube Q4H PRN Lance Coon, MD       Or  . acetaminophen (TYLENOL) suppository 650 mg  650 mg Rectal Q4H PRN Lance Coon, MD      . apixaban Arne Cleveland) tablet 5 mg  5 mg Oral BID Lance Coon, MD   5 mg at 02/25/18 2140  . atorvastatin (LIPITOR) tablet 20 mg  20 mg Oral Daily Lance Coon, MD   20 mg at 02/25/18 0806  . calcium citrate-vitamin D 500-500 MG-UNIT per chewable tablet 1 tablet  1 tablet Oral Daily Fritzi Mandes, MD   1 tablet at 02/25/18 [phone]  . FLUoxetine (PROZAC)  capsule 10 mg  10 mg Oral Daily Lance Coon, MD   10 mg at 02/25/18 0806  . fluticasone (FLONASE) 50 MCG/ACT nasal spray 1 spray  1 spray Each Nare Daily Fritzi Mandes, MD   1 spray at 02/24/18 1231  . insulin aspart (novoLOG) injection 0-5 Units  0-5 Units Subcutaneous QHS Fritzi Mandes, MD      . insulin aspart (novoLOG) injection 0-9 Units  0-9 Units Subcutaneous TID WC Fritzi Mandes, MD   1 Units at 02/25/18 1719  . latanoprost (XALATAN) 0.005 % ophthalmic solution 1 drop  1 drop Both Eyes QHS Lance Coon, MD   1 drop at 02/25/18 2142  . loratadine (CLARITIN) tablet 10 mg  10 mg Oral Daily Lance Coon, MD      . MEDLINE mouth rinse  15 mL Mouth Rinse BID Fritzi Mandes, MD   15 mL at 02/24/18 2056  . metFORMIN (GLUCOPHAGE-XR) 24 hr tablet 500 mg  500 mg Oral Q breakfast Fritzi Mandes, MD   500 mg at 02/25/18 0806  . metoprolol succinate (TOPROL-XL) 24 hr tablet 25 mg  25 mg Oral Daily Fritzi Mandes, MD   25 mg at 02/25/18 0806  . multivitamin with minerals tablet 1 tablet  1 tablet Oral Daily Fritzi Mandes, MD   1 tablet at 02/25/18 0806  . multivitamin-lutein (OCUVITE-LUTEIN) capsule 1 capsule  1 capsule Oral BID Fritzi Mandes, MD   1 capsule at 02/25/18 2140  . pyridostigmine (MESTINON) tablet 30 mg  30 mg Oral BID Fritzi Mandes, MD   30 mg at 02/24/18 2055     Discharge Medications: Please see discharge summary for a list of discharge medications.  Relevant Imaging Results:  Relevant Lab Results:   Additional Information SSN [id number]  Annamaria Boots, [place]

## 2018-02-26 NOTE — Progress Notes (Signed)
[place] [facility] [place] NAME: Kyle Richardson    MR#:  [id number]  DATE OF BIRTH:  [birth year]  SUBJECTIVE:  Son in the room Right sided weakness+ more in the UE Eating well per son  REVIEW OF SYSTEMS:   Review of Systems  Constitutional: Positive for malaise/fatigue. Negative for chills, fever and weight loss.  HENT: Negative for ear discharge, ear pain and nosebleeds.   Eyes: Positive for blurred vision. Negative for pain and discharge.  Respiratory: Negative for sputum production, shortness of breath, wheezing and stridor.   Cardiovascular: Negative for chest pain, palpitations, orthopnea and PND.  Gastrointestinal: Negative for abdominal pain, diarrhea, nausea and vomiting.  Genitourinary: Negative for frequency and urgency.  Musculoskeletal: Negative for back pain and joint pain.  Neurological: Positive for focal weakness and weakness. Negative for sensory change and speech change.  Psychiatric/Behavioral: Negative for depression and hallucinations. The patient is not nervous/anxious.    Tolerating Diet: yes Tolerating PT: CIR  DRUG ALLERGIES:   Allergies  Allergen Reactions  . Clarithromycin Other (See Comments) and Diarrhea  . Codeine   . Flomax [Tamsulosin Hcl]   . Lamisil [Terbinafine]   . Levofloxacin Other (See Comments)    Diplopia. NEVER put patient on levoquin!  . Sertraline Other (See Comments)  . Sulfa Antibiotics     VITALS:  Blood pressure (!) 123/47, pulse 64, temperature (!) 97.4 F (36.3 C), temperature source Oral, resp. rate 16, height 5\' 8"  (1.727 m), weight 79.4 kg (175 lb), SpO2 95 %.  PHYSICAL EXAMINATION:   Physical Exam  GENERAL:  84 y.o.-year-old patient lying in the bed with no acute distress.  EYES: Pupils equal, round, reactive to light and accommodation. No scleral icterus. Patient has field of vision cut on the right side. Only statements HEENT: Head atraumatic, normocephalic. Oropharynx  and nasopharynx clear.  NECK:  Supple, no jugular venous distention. No thyroid enlargement, no tenderness.  LUNGS: Normal breath sounds bilaterally, no wheezing, rales, rhonchi. No use of accessory muscles of respiration.  CARDIOVASCULAR: S1, S2 normal. No murmurs, rubs, or gallops.  ABDOMEN: Soft, nontender, nondistended. Bowel sounds present. No organomegaly or mass.  EXTREMITIES: No cyanosis, clubbing or edema b/l.    NEUROLOGIC: Cranial nerves II through XII are intact. Right upper and lower extremity hemiparesis decreased sensation in both right and upper lower extremity  PSYCHIATRIC:  patient is alert and oriented x 3.  SKIN: No obvious rash, lesion, or ulcer.   LABORATORY PANEL:  CBC Recent Labs  Lab 02/25/18 0411  WBC 9.0  HGB 15.2  HCT 44.5  PLT 210    Chemistries  Recent Labs  Lab 02/22/18 1950 02/25/18 0411  NA 138  --   K 3.8  --   CL 106  --   CO2 24  --   GLUCOSE 147*  --   BUN 18  --   CREATININE 0.95 0.85  CALCIUM 8.6*  --   AST 37  --   ALT 36  --   ALKPHOS 69  --   BILITOT 0.8  --    Cardiac Enzymes Recent Labs  Lab 02/22/18 1950  TROPONINI <0.03   RADIOLOGY:  No results found. ASSESSMENT AND PLAN:   Kyle Richardson  is a 84 y.o. male who presents after being found unresponsive outside by family.  When patient came to he stated that he had a visual disturbance, that he could not see on the right side.  Patient is  also confused  #Acute CVA--left PCA territory non hemorrhagic stroke appears embolic -CT had negative -patient awake alert oriented times two -on eliquis, statins Lipid profile OK -Echo EF 55-60%.. No cardiac source of emboli noted -Tele in NSR -seen by Neurology--significant right sided weakness due to acute strok.  #  Right homonymous hemianopsia -as well as some right upper extremity weakness, -neurology consultation placed -patient already on eliquis for a fib -MRA/MRI of the brain showed left PCA infarct -PT OT speech to  see patient--pt will need intense rehab  #  Type 2 diabetes mellitus with complication, without long-term current use of insulin (HCC) -sliding scale insulin with corresponding glucose checks -not on any med at home -will start po metformin XR qd  #  Hypertension -permissive hypertension for 24 hours, blood pressure goal less than 220/120, hold antihypertensives for now -PRN hydralazine  #  CAD (coronary artery disease) -continue home meds  # PAF (paroxysmal atrial fibrillation) (HCC)  -home rate controlling medications and oral anticoagulation - eliquis    #Hypercholesterolemia -home dose statin -lipid profile OK  #h/o Myasthenia Gravis Resume Mestinon 30 mg bid (sees Dr Ernst Bowler at [facility])--- if tolerate is increased to 60 mg BID----then 60 mg TID per neurology note from 02/21/2018  CM/CSW for d/c planning. Pt is medically ok for discharge--pt will BENEFIT from Inpatient intense rehab  Case discussed with Care Management/Social Worker. Management plans discussed with the patient, family and they are in agreement.  CODE STATUS: full  DVT Prophylaxis: elquis  TOTAL TIME TAKING CARE OF THIS PATIENT: *32 minutes.  >50% time spent on counselling and coordination of care  POSSIBLE D/C IN *1-2* DAYS, DEPENDING ON CLINICAL CONDITION.  Note: This dictation was prepared with Dragon dictation along with smaller phrase technology. Any transcriptional errors that result from this process are unintentional.  Fritzi Mandes M.D on 02/26/2018 at 8:11 AM  Between 7am to 6pm - Pager - [phone]  After 6pm go to [email or web address] - password [facility]  Office  [phone]  CC: Primary care physician; Einar Pheasant, MDPatient ID: Kyle Richardson, male   DOB: 1928-05-09, 84 y.o.   MRN: [id number]

## 2018-02-26 NOTE — Clinical Social Work Note (Signed)
Clinical Social Work Assessment  Patient Details  Name: Kyle Richardson MRN: [id number] Date of Birth: [birth year]  Date of referral:  02/26/18               Reason for consult:  Facility Placement                Permission sought to share information with:  Case Manager, Customer service manager, Family Supports Permission granted to share information::  Yes, Verbal Permission Granted  Name::      SNF  Agency::   [employer]  Relationship::     Contact Information:     Housing/Transportation Living arrangements for the past 2 months:  [place] of Information:  Patient Patient Interpreter Needed:  None Criminal Activity/Legal Involvement Pertinent to Current Situation/Hospitalization:  No - Comment as needed Significant Relationships:  Adult Children, [place], Spouse Lives with:  Spouse Do you feel safe going back to the place where you live?  Yes Need for family participation in patient care:  Yes (Comment)  Care giving concerns:  Patient lives with wife in [place] Worker assessment / plan:  CSW notified by RN CM that PT has recommended CIR [facility]) for patient. RN CM asked CSW to speak with patient to have a back up plan incase CIR is not available. CSW met with patient and he directed CSW to speak with his wife. CSW called patient's wife Kyle Richardson [phone]. CSW explained role and therapy recommendation. Wife states that she is hopeful that patient can go to CIR. CSW explained that his insurance may not approve CIR and we would need to have a back up plan in place. CSW explained SNF rehab and the need patient has for it. Wife is accepting of SNF if CIR can not take patient. Wife states that patient lives with her and they do no have help in the home but they could arrange for help in the home if needed. Wife also reports that she has been to [facility] in the past. CSW told wife the facilities in [place] and wife does not  have a preference. She would like to not go too far out of town but is willing to see what is available. CSW explained the bed search process and wife agreed to initiate bed search. CSW will follow up with patient and wife with bed offers once received.   Employment status:  Retired Nurse, adult PT Recommendations:  Inpatient [place] / Referral to community resources:  [place]  Patient/Family's Response to care:  Patient is alert and oriented but prefers his wife to do assessment.   Patient/Family's Understanding of and Emotional Response to Diagnosis, Current Treatment, and Prognosis:  Patient's wife is accepting of current treatments and plan. Patient also has a son and daughter that are accepting of plan as well.   Emotional Assessment Appearance:  Appears stated age Attitude/Demeanor/Rapport:  Lethargic Affect (typically observed):  Withdrawn Orientation:  Oriented to Self, Oriented to Place, Oriented to  Time Alcohol / Substance use:  Not Applicable Psych involvement (Current and /or in the community):  No (Comment)  Discharge Needs  Concerns to be addressed:  Discharge Planning Concerns Readmission within the last 30 days:  No Current discharge risk:  Dependent with Mobility Barriers to Discharge:  No Barriers Identified   Sissy Hoff 02/26/2018, 9:29 AM

## 2018-02-26 NOTE — Progress Notes (Signed)
Occupational Therapy Treatment Patient Details Name: Kyle Richardson MRN: [id number] DOB: [birth year] Today's Date: 02/26/2018    History of present illness Pt admitted for unresponsive episode, found outside by family. History includes CAD, DM, glaucoma, and HTN. Pt pending MRI, although CT negative for acute CVA. MRI confirms Positive L PCA CVA.   OT comments  Met pt lying in bed, sound asleep surrounded by 4 visitors. Session limited 2/2 pt fatigue, difficulty time keeping eyes open. Pt wife sitting on L side of pt offering many verbal commands at a quick pace. Family education given on the importance of R side set up and conversation to increase stimulation to R (affected) side. Education also given on slow steady speech with time to process. Pt presents with expressive difficulties, often only responding to wife. Functional PNF x10 (D1 and D2) completed on RUE to facilitate normal movement for functional return to eventually engage in BADL activity. Frequent verbal and tactile cues given to facilitate R visual scanning/head turn. Pt eyes not actively scanning to R this date (may be due to excess stimulation in room). Pt moves head to R with verbal or tactile cue, showing room for significant improvement. x5 head turns to R side completed with verbal and tactile cues to facilitate attention to R side to eventually increase BADL engagement. Pt shows sign of over stimulation (squeezing eyes closed appearing distressed), education given to family on importance of a non stimulating environment, family in understanding.   Pt would benefit from CIR to receive intensive neuro rehab to facilitate functional return to previous independent lifestyle.  Follow Up Recommendations  CIR    Equipment Recommendations  Other (comment)(TBD)    Recommendations for Other Services Rehab consult    Precautions / Restrictions Precautions Precautions: Fall Restrictions Weight Bearing Restrictions: No        Mobility Bed Mobility Overal bed mobility: Needs Assistance             General bed mobility comments: bed mobility not compelted this date, pt too fatigued- struggled to keep eyes open  Transfers                 General transfer comment: not compelted this date, pt too fatigued- struggled to keep eyes open    Balance                                           ADL either performed or assessed with clinical judgement   ADL Overall ADL's : Needs assistance/impaired Eating/Feeding: Maximal assistance;Sitting Eating/Feeding Details (indicate cue type and reason): per previous OT note Grooming: Wash/dry face;Sitting;Moderate assistance Grooming Details (indicate cue type and reason): per previous OT note Upper Body Bathing: Sitting;Moderate assistance   Lower Body Bathing: Maximal assistance;Sit to/from stand Lower Body Bathing Details (indicate cue type and reason): +2 to stand Upper Body Dressing : Sitting;Moderate assistance   Lower Body Dressing: Sit to/from stand;Maximal assistance Lower Body Dressing Details (indicate cue type and reason): +2 to stand Toilet Transfer: +2 for physical assistance;Maximal assistance;BSC;Stand-pivot           Functional mobility during ADLs: Maximal assistance;+2 for physical assistance General ADL Comments: Pt needing verbal and tactile cues to attend to R side     Vision Baseline Vision/History: Wears glasses;Glaucoma Wears Glasses: At all times     Perception     Praxis      Cognition Arousal/Alertness:  Lethargic Behavior During Therapy: WFL for tasks assessed/performed Overall Cognitive Status: Impaired/Different from baseline Area of Impairment: Following commands;Safety/judgement                         Safety/Judgement: Decreased awareness of deficits     General Comments: R side neglect, needing verbal and tactile cues to correct        Exercises     Shoulder Instructions        General Comments      Pertinent Vitals/ Pain       Pain Assessment: Faces Faces Pain Scale: Hurts a little bit Pain Location: pointing to stomach/hip area Pain Descriptors / Indicators: Discomfort Pain Intervention(s): Relaxation;Limited activity within patient's tolerance  Home Living                                          Prior Functioning/Environment              Frequency  Min 3X/week        Progress Toward Goals  OT Goals(current goals can now be found in the care plan section)     Acute Rehab OT Goals Patient Stated Goal: pt not able to verbalize goals OT Goal Formulation: With patient/family Time For Goal Achievement: 03/11/18 Potential to Achieve Goals: Good  Plan Discharge plan remains appropriate    Co-evaluation                 AM-PAC PT "6 Clicks" Daily Activity     Outcome Measure   Help from another person eating meals?: A Lot Help from another person taking care of personal grooming?: A Lot Help from another person toileting, which includes using toliet, bedpan, or urinal?: A Lot Help from another person bathing (including washing, rinsing, drying)?: A Lot Help from another person to put on and taking off regular upper body clothing?: A Lot Help from another person to put on and taking off regular lower body clothing?: A Lot 6 Click Score: 12    End of Session    OT Visit Diagnosis: Other abnormalities of gait and mobility (R26.89);Hemiplegia and hemiparesis;Other symptoms and signs involving cognitive function;Low vision, both eyes (H54.2);Cognitive communication deficit (R41.841) Hemiplegia - Right/Left: Right Hemiplegia - dominant/non-dominant: Dominant Hemiplegia - caused by: Cerebral infarction   Activity Tolerance Patient limited by fatigue   Patient Left in bed;with call bell/phone within reach;with bed alarm set;with family/visitor present   Nurse Communication          Time: 3833-3832 OT  Time Calculation (min): 12 min  Charges: OT General Charges $OT Visit: 1 Visit OT Treatments $Neuromuscular Re-education: 8-22 mins  Zenovia Jarred, MSOT, OTR/L   [place] 02/26/2018, 5:33 PM

## 2018-02-26 NOTE — Care Management (Signed)
Spoke with Karene Fry RN representative for Inpatient Acute Rehab. At [facility]. "Haven't heard anything from [facility] regarding authorization for admission to their facility." Usually takes two days Shelbie Ammons RN MSN [place] Management [phone]

## 2018-02-27 ENCOUNTER — Inpatient Hospital Stay (HOSPITAL_COMMUNITY)
Admission: RE | Admit: 2018-02-27 | Discharge: 2018-04-01 | DRG: 057 | Disposition: A | Discharge status: Home Health | Payer: PPO | Source: Other Acute Inpatient Hospital | Attending: Physical Medicine & Rehabilitation | Admitting: Physical Medicine & Rehabilitation

## 2018-02-27 ENCOUNTER — Encounter (HOSPITAL_COMMUNITY): Payer: Self-pay

## 2018-02-27 ENCOUNTER — Telehealth: Payer: Self-pay

## 2018-02-27 DIAGNOSIS — G4733 Obstructive sleep apnea (adult) (pediatric): Secondary | ICD-10-CM | POA: Diagnosis present

## 2018-02-27 DIAGNOSIS — Z8249 Family history of ischemic heart disease and other diseases of the circulatory system: Secondary | ICD-10-CM

## 2018-02-27 DIAGNOSIS — I2510 Atherosclerotic heart disease of native coronary artery without angina pectoris: Secondary | ICD-10-CM | POA: Diagnosis not present

## 2018-02-27 DIAGNOSIS — R269 Unspecified abnormalities of gait and mobility: Secondary | ICD-10-CM | POA: Diagnosis not present

## 2018-02-27 DIAGNOSIS — G4700 Insomnia, unspecified: Secondary | ICD-10-CM | POA: Diagnosis not present

## 2018-02-27 DIAGNOSIS — G7000 Myasthenia gravis without (acute) exacerbation: Secondary | ICD-10-CM | POA: Diagnosis present

## 2018-02-27 DIAGNOSIS — R0902 Hypoxemia: Secondary | ICD-10-CM | POA: Diagnosis not present

## 2018-02-27 DIAGNOSIS — R414 Neurologic neglect syndrome: Secondary | ICD-10-CM | POA: Diagnosis present

## 2018-02-27 DIAGNOSIS — J189 Pneumonia, unspecified organism: Secondary | ICD-10-CM

## 2018-02-27 DIAGNOSIS — Z882 Allergy status to sulfonamides status: Secondary | ICD-10-CM

## 2018-02-27 DIAGNOSIS — I252 Old myocardial infarction: Secondary | ICD-10-CM

## 2018-02-27 DIAGNOSIS — Z951 Presence of aortocoronary bypass graft: Secondary | ICD-10-CM

## 2018-02-27 DIAGNOSIS — E119 Type 2 diabetes mellitus without complications: Secondary | ICD-10-CM

## 2018-02-27 DIAGNOSIS — Z7901 Long term (current) use of anticoagulants: Secondary | ICD-10-CM | POA: Diagnosis not present

## 2018-02-27 DIAGNOSIS — R0989 Other specified symptoms and signs involving the circulatory and respiratory systems: Secondary | ICD-10-CM | POA: Diagnosis not present

## 2018-02-27 DIAGNOSIS — Z833 Family history of diabetes mellitus: Secondary | ICD-10-CM | POA: Diagnosis not present

## 2018-02-27 DIAGNOSIS — Z823 Family history of stroke: Secondary | ICD-10-CM

## 2018-02-27 DIAGNOSIS — I639 Cerebral infarction, unspecified: Secondary | ICD-10-CM | POA: Diagnosis not present

## 2018-02-27 DIAGNOSIS — H53461 Homonymous bilateral field defects, right side: Secondary | ICD-10-CM | POA: Diagnosis present

## 2018-02-27 DIAGNOSIS — I69320 Aphasia following cerebral infarction: Secondary | ICD-10-CM | POA: Diagnosis not present

## 2018-02-27 DIAGNOSIS — I69398 Other sequelae of cerebral infarction: Secondary | ICD-10-CM | POA: Diagnosis not present

## 2018-02-27 DIAGNOSIS — E1142 Type 2 diabetes mellitus with diabetic polyneuropathy: Secondary | ICD-10-CM | POA: Diagnosis not present

## 2018-02-27 DIAGNOSIS — H9190 Unspecified hearing loss, unspecified ear: Secondary | ICD-10-CM | POA: Diagnosis not present

## 2018-02-27 DIAGNOSIS — R4182 Altered mental status, unspecified: Secondary | ICD-10-CM | POA: Diagnosis not present

## 2018-02-27 DIAGNOSIS — I69351 Hemiplegia and hemiparesis following cerebral infarction affecting right dominant side: Principal | ICD-10-CM

## 2018-02-27 DIAGNOSIS — J449 Chronic obstructive pulmonary disease, unspecified: Secondary | ICD-10-CM | POA: Diagnosis present

## 2018-02-27 DIAGNOSIS — Z9861 Coronary angioplasty status: Secondary | ICD-10-CM | POA: Diagnosis not present

## 2018-02-27 DIAGNOSIS — I69390 Apraxia following cerebral infarction: Secondary | ICD-10-CM | POA: Diagnosis not present

## 2018-02-27 DIAGNOSIS — R569 Unspecified convulsions: Secondary | ICD-10-CM

## 2018-02-27 DIAGNOSIS — R404 Transient alteration of awareness: Secondary | ICD-10-CM

## 2018-02-27 DIAGNOSIS — H532 Diplopia: Secondary | ICD-10-CM | POA: Diagnosis present

## 2018-02-27 DIAGNOSIS — E7800 Pure hypercholesterolemia, unspecified: Secondary | ICD-10-CM | POA: Diagnosis not present

## 2018-02-27 DIAGNOSIS — I10 Essential (primary) hypertension: Secondary | ICD-10-CM | POA: Diagnosis not present

## 2018-02-27 DIAGNOSIS — I480 Paroxysmal atrial fibrillation: Secondary | ICD-10-CM | POA: Diagnosis not present

## 2018-02-27 DIAGNOSIS — I482 Chronic atrial fibrillation: Secondary | ICD-10-CM | POA: Diagnosis not present

## 2018-02-27 DIAGNOSIS — R410 Disorientation, unspecified: Secondary | ICD-10-CM

## 2018-02-27 DIAGNOSIS — R918 Other nonspecific abnormal finding of lung field: Secondary | ICD-10-CM | POA: Diagnosis not present

## 2018-02-27 DIAGNOSIS — J9811 Atelectasis: Secondary | ICD-10-CM | POA: Diagnosis not present

## 2018-02-27 DIAGNOSIS — I63532 Cerebral infarction due to unspecified occlusion or stenosis of left posterior cerebral artery: Secondary | ICD-10-CM | POA: Diagnosis present

## 2018-02-27 DIAGNOSIS — E1165 Type 2 diabetes mellitus with hyperglycemia: Secondary | ICD-10-CM | POA: Diagnosis present

## 2018-02-27 DIAGNOSIS — H409 Unspecified glaucoma: Secondary | ICD-10-CM | POA: Diagnosis not present

## 2018-02-27 LAB — GLUCOSE, CAPILLARY
GLUCOSE-CAPILLARY: 225 mg/dL — AB (ref 65–99)
GLUCOSE-CAPILLARY: 107 mg/dL — AB (ref 65–99)
GLUCOSE-CAPILLARY: 167 mg/dL — AB (ref 65–99)
Glucose-Capillary: 158 mg/dL — ABNORMAL HIGH (ref 65–99)

## 2018-02-27 MED ORDER — FLEET ENEMA 7-19 GM/118ML RE ENEM: 1.0000 | ENEMA | Freq: Once | RECTAL | Status: DC | PRN

## 2018-02-27 MED ORDER — PYRIDOSTIGMINE BROMIDE 60 MG PO TABS: 60.0000 mg | ORAL_TABLET | Freq: Two times a day (BID) | ORAL | 0 refills | Status: DC

## 2018-02-27 MED ORDER — METFORMIN HCL ER 500 MG PO TB24: 500.0000 mg | ORAL_TABLET | Freq: Every day | ORAL | 1 refills | Status: DC

## 2018-02-27 MED ORDER — PROSIGHT PO TABS
1.0000 | ORAL_TABLET | Freq: Two times a day (BID) | ORAL | Status: DC
  Administered 2018-02-27 – 2018-04-01 (×65): 1 via ORAL
  Filled 2018-02-27 (×66): qty 1

## 2018-02-27 MED ORDER — APIXABAN 5 MG PO TABS
5.0000 mg | ORAL_TABLET | Freq: Two times a day (BID) | ORAL | Status: DC
  Administered 2018-02-27 – 2018-04-01 (×65): 5 mg via ORAL
  Filled 2018-02-27 (×67): qty 1

## 2018-02-27 MED ORDER — CALCIUM CARBONATE-VITAMIN D 500-200 MG-UNIT PO TABS: 1.0000 | ORAL_TABLET | Freq: Every day | ORAL | Status: DC

## 2018-02-27 MED ORDER — ATORVASTATIN CALCIUM 40 MG PO TABS
40.0000 mg | ORAL_TABLET | Freq: Every day | ORAL | Status: DC
  Administered 2018-02-28 – 2018-04-01 (×33): 40 mg via ORAL
  Filled 2018-02-27 (×33): qty 1

## 2018-02-27 MED ORDER — ORAL CARE MOUTH RINSE
15.0000 mL | Freq: Two times a day (BID) | OROMUCOSAL | Status: DC
  Administered 2018-02-27 – 2018-03-30 (×48): 15 mL via OROMUCOSAL

## 2018-02-27 MED ORDER — BISACODYL 10 MG RE SUPP: 10.0000 mg | Freq: Every day | RECTAL | Status: DC | PRN

## 2018-02-27 MED ORDER — ADULT MULTIVITAMIN W/MINERALS CH
1.0000 | ORAL_TABLET | Freq: Every day | ORAL | Status: DC
  Administered 2018-02-28 – 2018-04-01 (×33): 1 via ORAL
  Filled 2018-02-27 (×33): qty 1

## 2018-02-27 MED ORDER — ACETAMINOPHEN 325 MG PO TABS
325.0000 mg | ORAL_TABLET | ORAL | Status: DC | PRN
  Administered 2018-02-28 – 2018-03-03 (×6): 650 mg via ORAL
  Administered 2018-03-04: 325 mg via ORAL
  Administered 2018-03-05 – 2018-03-06 (×3): 650 mg via ORAL
  Filled 2018-02-27 (×11): qty 2

## 2018-02-27 MED ORDER — FLUTICASONE PROPIONATE 50 MCG/ACT NA SUSP
1.0000 | Freq: Every day | NASAL | Status: DC
  Administered 2018-03-04 – 2018-03-30 (×23): 1 via NASAL
  Filled 2018-02-27: qty 16

## 2018-02-27 MED ORDER — METOPROLOL SUCCINATE ER 25 MG PO TB24
25.0000 mg | ORAL_TABLET | Freq: Every day | ORAL | Status: DC
  Administered 2018-02-28 – 2018-04-01 (×33): 25 mg via ORAL
  Filled 2018-02-27 (×33): qty 1

## 2018-02-27 MED ORDER — CALCIUM CARBONATE-VITAMIN D 500-200 MG-UNIT PO TABS
1.0000 | ORAL_TABLET | Freq: Every day | ORAL | Status: DC
  Administered 2018-02-28 – 2018-04-01 (×33): 1 via ORAL
  Filled 2018-02-27 (×33): qty 1

## 2018-02-27 MED ORDER — INSULIN ASPART 100 UNIT/ML ~~LOC~~ SOLN
0.0000 [IU] | Freq: Every day | SUBCUTANEOUS | Status: DC
  Administered 2018-03-03 – 2018-03-19 (×2): 2 [IU] via SUBCUTANEOUS

## 2018-02-27 MED ORDER — PROCHLORPERAZINE MALEATE 5 MG PO TABS: 5.0000 mg | ORAL_TABLET | Freq: Four times a day (QID) | ORAL | Status: DC | PRN

## 2018-02-27 MED ORDER — LATANOPROST 0.005 % OP SOLN
1.0000 [drp] | Freq: Every day | OPHTHALMIC | Status: DC
  Administered 2018-02-28 – 2018-03-31 (×31): 1 [drp] via OPHTHALMIC
  Filled 2018-02-27 (×2): qty 2.5

## 2018-02-27 MED ORDER — GUAIFENESIN-DM 100-10 MG/5ML PO SYRP: 5.0000 mL | ORAL_SOLUTION | Freq: Four times a day (QID) | ORAL | Status: DC | PRN

## 2018-02-27 MED ORDER — PROCHLORPERAZINE EDISYLATE 10 MG/2ML IJ SOLN: 5.0000 mg | Freq: Four times a day (QID) | INTRAMUSCULAR | Status: DC | PRN

## 2018-02-27 MED ORDER — DIPHENHYDRAMINE HCL 12.5 MG/5ML PO ELIX
12.5000 mg | ORAL_SOLUTION | Freq: Four times a day (QID) | ORAL | Status: DC | PRN
  Filled 2018-02-27: qty 10

## 2018-02-27 MED ORDER — PHENOL 1.4 % MT LIQD: 1.0000 | OROMUCOSAL | Status: DC | PRN

## 2018-02-27 MED ORDER — ALUM & MAG HYDROXIDE-SIMETH 200-200-20 MG/5ML PO SUSP
30.0000 mL | ORAL | Status: DC | PRN
  Administered 2018-03-05: 30 mL via ORAL
  Filled 2018-02-27: qty 30

## 2018-02-27 MED ORDER — POLYETHYLENE GLYCOL 3350 17 G PO PACK: 17.0000 g | PACK | Freq: Every day | ORAL | Status: DC | PRN

## 2018-02-27 MED ORDER — TRAZODONE HCL 50 MG PO TABS
25.0000 mg | ORAL_TABLET | Freq: Every evening | ORAL | Status: DC | PRN
  Administered 2018-03-19 – 2018-03-27 (×7): 50 mg via ORAL
  Filled 2018-02-27 (×8): qty 1

## 2018-02-27 MED ORDER — INSULIN ASPART 100 UNIT/ML ~~LOC~~ SOLN
0.0000 [IU] | Freq: Three times a day (TID) | SUBCUTANEOUS | Status: DC
  Administered 2018-02-28: 3 [IU] via SUBCUTANEOUS
  Administered 2018-02-28 – 2018-03-01 (×2): 2 [IU] via SUBCUTANEOUS
  Administered 2018-03-02: 1 [IU] via SUBCUTANEOUS
  Administered 2018-03-02: 2 [IU] via SUBCUTANEOUS
  Administered 2018-03-03: 1 [IU] via SUBCUTANEOUS
  Administered 2018-03-03: 2 [IU] via SUBCUTANEOUS
  Administered 2018-03-03 – 2018-03-04 (×2): 1 [IU] via SUBCUTANEOUS
  Administered 2018-03-04: 2 [IU] via SUBCUTANEOUS
  Administered 2018-03-04 – 2018-03-05 (×2): 1 [IU] via SUBCUTANEOUS
  Administered 2018-03-05: 2 [IU] via SUBCUTANEOUS
  Administered 2018-03-05 – 2018-03-06 (×3): 1 [IU] via SUBCUTANEOUS
  Administered 2018-03-06: 2 [IU] via SUBCUTANEOUS
  Administered 2018-03-07: 1 [IU] via SUBCUTANEOUS
  Administered 2018-03-07 – 2018-03-08 (×2): 2 [IU] via SUBCUTANEOUS
  Administered 2018-03-08 – 2018-03-09 (×2): 1 [IU] via SUBCUTANEOUS
  Administered 2018-03-09: 2 [IU] via SUBCUTANEOUS
  Administered 2018-03-09: 1 [IU] via SUBCUTANEOUS
  Administered 2018-03-10 (×2): 2 [IU] via SUBCUTANEOUS
  Administered 2018-03-10: 1 [IU] via SUBCUTANEOUS
  Administered 2018-03-11: 2 [IU] via SUBCUTANEOUS
  Administered 2018-03-11 (×2): 1 [IU] via SUBCUTANEOUS
  Administered 2018-03-12: 2 [IU] via SUBCUTANEOUS
  Administered 2018-03-12 – 2018-03-13 (×4): 1 [IU] via SUBCUTANEOUS
  Administered 2018-03-13: 2 [IU] via SUBCUTANEOUS
  Administered 2018-03-14 – 2018-03-15 (×3): 1 [IU] via SUBCUTANEOUS
  Administered 2018-03-16: 2 [IU] via SUBCUTANEOUS
  Administered 2018-03-16 – 2018-03-17 (×3): 1 [IU] via SUBCUTANEOUS
  Administered 2018-03-17 (×2): 2 [IU] via SUBCUTANEOUS
  Administered 2018-03-18 (×2): 1 [IU] via SUBCUTANEOUS
  Administered 2018-03-18 – 2018-03-19 (×2): 2 [IU] via SUBCUTANEOUS
  Administered 2018-03-19: 1 [IU] via SUBCUTANEOUS
  Administered 2018-03-20: 2 [IU] via SUBCUTANEOUS
  Administered 2018-03-20: 1 [IU] via SUBCUTANEOUS
  Administered 2018-03-21: 2 [IU] via SUBCUTANEOUS
  Administered 2018-03-21 – 2018-03-22 (×3): 1 [IU] via SUBCUTANEOUS
  Administered 2018-03-22: 3 [IU] via SUBCUTANEOUS
  Administered 2018-03-23 (×2): 1 [IU] via SUBCUTANEOUS
  Administered 2018-03-23: 3 [IU] via SUBCUTANEOUS
  Administered 2018-03-24 – 2018-03-30 (×13): 1 [IU] via SUBCUTANEOUS
  Administered 2018-03-31: 2 [IU] via SUBCUTANEOUS

## 2018-02-27 MED ORDER — LORATADINE 10 MG PO TABS
10.0000 mg | ORAL_TABLET | Freq: Every day | ORAL | Status: DC
  Administered 2018-02-28 – 2018-04-01 (×33): 10 mg via ORAL
  Filled 2018-02-27 (×33): qty 1

## 2018-02-27 MED ORDER — FLUOXETINE HCL 10 MG PO CAPS
10.0000 mg | ORAL_CAPSULE | Freq: Every day | ORAL | Status: DC
  Administered 2018-02-28 – 2018-04-01 (×33): 10 mg via ORAL
  Filled 2018-02-27 (×33): qty 1

## 2018-02-27 MED ORDER — METFORMIN HCL ER 500 MG PO TB24
500.0000 mg | ORAL_TABLET | Freq: Every day | ORAL | Status: DC
  Filled 2018-02-27 (×2): qty 1

## 2018-02-27 MED ORDER — PROCHLORPERAZINE 25 MG RE SUPP: 12.5000 mg | Freq: Four times a day (QID) | RECTAL | Status: DC | PRN

## 2018-02-27 MED ORDER — PYRIDOSTIGMINE BROMIDE 60 MG PO TABS
60.0000 mg | ORAL_TABLET | Freq: Two times a day (BID) | ORAL | Status: DC
  Filled 2018-02-27: qty 1

## 2018-02-27 NOTE — Progress Notes (Signed)
Telephone call to [place] [phone] set up transportation to [facility] for non emergency transport; pt's discharge pending arrival of [facility] personnel for transportation

## 2018-02-27 NOTE — Care Management (Signed)
Notified that approval has been given for CIR.  Patient to travel by [facility] and needs to arrive by 2pm.  Updated primary nurse and attending. Non emergent transfer form completed.

## 2018-02-27 NOTE — Plan of Care (Signed)
  Problem: Education: Goal: Knowledge of disease or condition will improve Outcome: Progressing Goal: Knowledge of secondary prevention will improve Outcome: Progressing Goal: Knowledge of patient specific risk factors addressed and post discharge goals established will improve Outcome: Progressing   Problem: Coping: Goal: Will identify appropriate support needs Outcome: Progressing   Problem: Health Behavior/Discharge Planning: Goal: Ability to manage health-related needs will improve Outcome: Progressing   Problem: Self-Care: Goal: Ability to communicate needs accurately will improve Outcome: Progressing   Problem: Nutrition: Goal: Risk of aspiration will decrease Outcome: Progressing   Problem: Ischemic Stroke/TIA Tissue Perfusion: Goal: Complications of ischemic stroke/TIA will be minimized Outcome: Progressing   Problem: Education: Goal: Knowledge of General Education information will improve Outcome: Progressing   Problem: Health Behavior/Discharge Planning: Goal: Ability to manage health-related needs will improve Outcome: Progressing

## 2018-02-27 NOTE — Care Management Important Message (Signed)
Important Message  Patient Details  Name: Kyle Richardson MRN: [id number] Date of Birth: [birth year]   Medicare Important Message Given:  Yes    [name] 02/27/2018, 12:02 PM

## 2018-02-27 NOTE — Progress Notes (Addendum)
MD order in [facility] to discharge pt to [facility] today; Care Management previously prepared discharge packet for [facility] personnel to take to [facility]; pt to be discharged to [facility]; telephone report called to [place] at [phone]; no questions voiced at this time; discharge pending arrival of [facility]

## 2018-02-27 NOTE — Progress Notes (Signed)
Physical Therapy Treatment Patient Details Name: Kyle Richardson MRN: [id number] DOB: 12/02/1927 Today's Date: 02/27/2018    History of Present Illness Pt admitted for unresponsive episode, found outside by family. History includes CAD, DM, glaucoma, and HTN. Pt pending MRI, although CT negative for acute CVA. MRI confirms Positive L PCA CVA.    PT Comments    Pt lethargic, but agreeable to PT. Denies pain. RLE noted to have a significant amount of flexor tone making range and function (stand attempt difficult). Pt does demonstrate improvement with bed mobility with good effort scooting to left to edge of bed and trying to mobilize LEs off edge of bed to sit; overall Mod A (improved from last visit). Initially Min A to sit edge of bed with improvement to SBA with sitting tolerance and working on righting and assisted reaching. Some improvement with passive range R knee in sit. Attempted stand; however, pt unable to maintain RLE on ground or initiate with LLE to stand. Assist of 2 to reposition in bed. At the time of documentation, pt has current discharge to CIR to continue rehab efforts.    Follow Up Recommendations  CIR     Equipment Recommendations       Recommendations for Other Services       Precautions / Restrictions Precautions Precautions: Fall Restrictions Weight Bearing Restrictions: No    Mobility  Bed Mobility Overal bed mobility: Needs Assistance Bed Mobility: Sit to Supine;Supine to Sit     Supine to sit: Mod assist Sit to supine: Mod assist   General bed mobility comments: Gives good effort to scoot hips in supine towards edge of bed and mobilize LLE; RLE with tone and requires assist as well as at trunk, but overall improved.   Transfers                 General transfer comment: attempt; unable to keep RLE on floor or activate LLE to initate stand  Ambulation/Gait                 Stairs             Wheelchair Mobility    Modified  Rankin (Stroke Patients Only)       Balance                                            Cognition Arousal/Alertness: Lethargic Behavior During Therapy: WFL for tasks assessed/performed Overall Cognitive Status: Impaired/Different from baseline Area of Impairment: (Speech)                                      Exercises General Exercises - Lower Extremity Ankle Circles/Pumps: AAROM;Both;15 reps Long Arc Quad: Right;10 reps;Seated;PROM(2 sets) Heel Slides: PROM;Right;10 reps;Supine;Other (comment)(too much tone for much movement) Other Exercises Other Exercises: seated balance righting to L; assisted reaching with LUE    General Comments        Pertinent Vitals/Pain Pain Assessment: No/denies pain    Home Living                      Prior Function            PT Goals (current goals can now be found in the care plan section) Progress towards PT goals: Progressing toward goals  Frequency    7X/week      PT Plan Current plan remains appropriate    Co-evaluation              AM-PAC PT "6 Clicks" Daily Activity  Outcome Measure  Difficulty turning over in bed (including adjusting bedclothes, sheets and blankets)?: Unable Difficulty moving from lying on back to sitting on the side of the bed? : Unable Difficulty sitting down on and standing up from a chair with arms (e.g., wheelchair, bedside commode, etc,.)?: Unable Help needed moving to and from a bed to chair (including a wheelchair)?: Total Help needed walking in hospital room?: Total Help needed climbing 3-5 steps with a railing? : Total 6 Click Score: 6    End of Session Equipment Utilized During Treatment: Gait belt Activity Tolerance: Patient limited by fatigue;Other (comment)(visual field; weakness; tone) Patient left: in chair;with call bell/phone within reach;with bed alarm set;with family/visitor present;with nursing/sitter in room Nurse  Communication: Other (comment)(treatment findings) PT Visit Diagnosis: Muscle weakness (generalized) (M62.81);History of falling (Z91.81);Difficulty in walking, not elsewhere classified (R26.2);Hemiplegia and hemiparesis Hemiplegia - Right/Left: Right Hemiplegia - dominant/non-dominant: Dominant Hemiplegia - caused by: Cerebral infarction     Time: 2641-5830 PT Time Calculation (min) (ACUTE ONLY): 39 min  Charges:  $Therapeutic Exercise: 8-22 mins $Therapeutic Activity: 8-22 mins $Neuromuscular Re-education: 8-22 mins                    G Codes:        Larae Grooms, PTA 02/27/2018, 1:28 PM

## 2018-02-27 NOTE — Progress Notes (Signed)
[facility] present for pt discharge; discharge paperwork given to [facility] personnel; pt discharged via stretcher to [facility]

## 2018-02-27 NOTE — PMR Pre-admission (Signed)
[facility] PMR Admission Coordinator Pre-Admission Assessment  Patient: Kyle Richardson is an 84 y.o., male MRN: [id number] DOB: [birth year] Height: _0  (172.7 cm) Weight: 79.4 kg (175 lb)  Insurance Information HMO:  Yes    PPO:       PCP:       IPA:       80/20:       OTHER: Plan 1 advantage plan PRIMARY: [employer]      Policy#: [id number]      Subscriber: patient CM Name: Nydia Bouton      Phone#: [phone]     Fax#: [phone] Pre-Cert#: [id number] approved for 7 days      Employer:  Retired Benefits:  Phone #: [phone]     Name: On line portal Eff. Date: 02/25/18     Deduct:  $0      Out of Pocket Max: $3400 (met $35.00)      Life Max: N/A CIR: $295 days 1-6      SNF: $20 days 1-20; $160 days 21-100 Outpatient:  Medical necessity     Co-Pay: $15/visit Home Health: 100%      Co-Pay: none DME: 80%     Co-Pay: 20% Providers: in network  Emergency Contact Information Contact Information    Name Relation Home Work Mobile   [place] Spouse [phone]     [name] Daughter   [phone]   Vernie, Piet   [phone]      Current Medical History  Patient Admitting Diagnosis:  L CVA  History of Present Illness:  Melinda.[place] y.o.malewho presented to [facility] on 5/171/9 after being found unresponsive outside by family. When patient came to he stated that he had a visual disturbance, that he could not see on the right side. Patient was also confused.  Patient suffered a left PCA territory no hemorrhagic stroke which was likely embolic.  Patient has a history of myesthenia gravis.  Patient lives with his wife and assisted wife prior to admission.  PT/OT evaluations were completed with recommendations for acute inpatient rehab admission.  Patient was also seen by SLP for expressive and receptive aphasia.  Patient to be admitted today for acute inpatient rehab program.  Patient's medical record from [facility] has been reviewed by the  rehabilitation admission coordinator and physician.  NIH Stroke scale: 18  Past Medical History  Past Medical History:  Diagnosis Date  . 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  s/p PTCA of the mid RCA 1/96, also  s/p  CABG x 4 7/96  . Degenerative disc disease   . Diabetes mellitus ([place])   . Glaucoma   . Hypercholesterolemia   . Hypertension   . Migraine headache    history of  . Osteoarthritis   . Sleep apnea     Family History   family history includes Diabetes in his unknown relative; Heart disease in his brother, father, and mother.  Prior Rehab/Hospitalizations Has the patient had major surgery during 100 days prior to admission? No    Current Medications See MAR from [facility]  Patients Current Diet:    Heart healthy, carb mod, thin liquids  Precautions / Restrictions Precautions Precautions: Fall Restrictions Weight Bearing Restrictions: No Other Position/Activity Restrictions: Watch R ankle during standing/transfers.   Has the patient had 2 or more falls or a fall with injury in the past year?Yes.  Patient suffered 1 fall at the time of the most  recent CVA and hospital admission.  Prior Activity Level Community (5-7x/wk): Went outside daily, went off the property 3-4 X a week.  Prior Functional Level Self Care: Did the patient need help bathing, dressing, using the toilet or eating?  Independent  Indoor Mobility: Did the patient need assistance with walking from room to room (with or without device)? Independent  Stairs: Did the patient need assistance with internal or external stairs (with or without device)? Independent  Functional Cognition: Did the patient need help planning regular tasks such as shopping or remembering to take medications? Independent  Home Assistive Devices / Equipment Home Assistive Devices/Equipment: Eyeglasses, Hearing aid Home Equipment: Grab bars - tub/shower, Shower  seat  Prior Device Use: Indicate devices/aids used by the patient prior to current illness, exacerbation or injury? None   Prior Functional Level Current Functional Level  Bed Mobility  Independent  Max assist   Transfers  Independent  Max assist   Mobility - Walk/Wheelchair  Independent  Other(Not tried)   Upper Body Dressing  Independent  Mod assist   Lower Body Dressing  Independent  Max assist   Grooming  Independent  Mod assist   Eating/Drinking  Independent  Max assist   Toilet Transfer  Independent  Max assist   Bladder Continence   WDL  Incontinence at times   Bowel Management  WDL  Last BM 02/27/18   Stair Climbing  Independent Other(Not tried)   Counsellor  Expressive/receptive aphasia  Memory  WDL  Impaired  Cooking/Meal Prep  Independent      Housework  Independent    Money Management  Independent    Driving  Yes, driving, independent     Special needs/care consideration BiPAP/CPAP No CPM No Continuous Drip IV No Dialysis No       Life Vest No Oxygen No Special Bed No Trach Size No Wound Vac (area) No     Skin No                            Bowel mgmt: Last BM 02/27/18 Bladder mgmt: Voiding with incontinence at times Diabetic mgmt Yes, prediabetic, not on medication at home  Previous Home Environment Living Arrangements: Spouse/significant other  Lives With: Spouse Available Help at Discharge: Family Type of Home: House Home Layout: One level Home Access: Stairs to enter Entrance Stairs-Rails: Can reach both Entrance Stairs-Number of Steps: 2 Bathroom Shower/Tub: Multimedia programmer: [place]: No  Discharge Living Setting Plans for Discharge Living Setting: Patient's home, House, Lives with (comment)(Lives with wife.) Type of Home at Discharge: House Discharge Home Layout: One level Discharge Home Access: Stairs to enter [place] of Steps: 2 step  entry Does the patient have any problems obtaining your medications?: No  Social/Family/Support Systems Patient Roles: Spouse, Parent(Has a wife, dtr and a handicapped son.) Contact Information: Hamid Brookens - wife - [phone] Anticipated Caregiver: Wife and a cousin who lives close by Ability/Limitations of Caregiver: Wife is on a walker and husband cared for wife PTA Caregiver Availability: 24/7 Discharge Plan Discussed with Primary Caregiver: Yes Is Caregiver In Agreement with Plan?: Yes Does Caregiver/Family have Issues with Lodging/Transportation while Pt is in Rehab?: No  Goals/Additional Needs Patient/Family Goal for Rehab: PT/OT supervision to min assist goals, SLP mod I and supervision goals Expected length of stay: 22-27 days Cultural Considerations: Attends [employer] on Sundays Dietary Needs: Heart healthy, carb mod, thin liquids Equipment Needs:  TBD Additional Information: Husband assisted his wife PTA.  Wife has to use a walker. Pt/Family Agrees to Admission and willing to participate: Yes Program Orientation Provided & Reviewed with Pt/Caregiver Including Roles  & Responsibilities: Yes  Patient Condition: I have reviewed all clinical information and have shared with the rehab MD.  I spoke with patient's wife, the attending MD and with unit case manager.  Patient can benefit and can tolerate 3 hours of therapy a day.  He needs the inpatient setting to monitor his functional progress and to manage his medical problems.  I have approval from rehab MD to admit to acute inpatient rehab today.  Preadmission Screen Completed By:  Retta Diones, 02/27/2018 2:10 PM ______________________________________________________________________   Discussed status with Dr. Posey Pronto on 02/27/18 at 1249 and received telephone approval for admission today.  Admission Coordinator:  Retta Diones, time 1249/Date 02/27/18   Assessment/Plan: Diagnosis:  L CVA  1. Does the need  for close, 24 hr/day  Medical supervision in concert with the patient's rehab needs make it unreasonable for this patient to be served in a less intensive setting? Yes 2. Co-Morbidities requiring supervision/potential complications: OSA, HTN, DM 3. Due to bladder management, bowel management, safety, skin/wound care, disease management and patient education, does the patient require 24 hr/day rehab nursing? Yes 4. Does the patient require coordinated care of a physician, rehab nurse, PT (1-2 hrs/day, 5 days/week), OT (1-2 hrs/day, 5 days/week) and SLP (1-2 hrs/day, 5 days/week) to address physical and functional deficits in the context of the above medical diagnosis(es)? Yes Addressing deficits in the following areas: balance, endurance, locomotion, strength, transferring, bathing, dressing, feeding, grooming, toileting, cognition, speech, language, swallowing and psychosocial support 5. Can the patient actively participate in an intensive therapy program of at least 3 hrs of therapy 5 days a week? Yes 6. The potential for patient to make measurable gains while on inpatient rehab is excellent 7. Anticipated functional outcomes upon discharge from inpatients are: min assist PT, min assist OT, min assist SLP 8. Estimated rehab length of stay to reach the above functional goals is: 23-27 days. 9. Does the patient have adequate social supports to accommodate these discharge functional goals? Potentially 10. Anticipated D/C setting: Home 11. Anticipated post D/C treatments: HH therapy and Home excercise program 12. Overall Rehab/Functional Prognosis: good    RECOMMENDATIONS: This patient's condition is appropriate for continued rehabilitative care in the following setting: CIR Patient has agreed to participate in recommended program. Potentially Note that insurance prior authorization may be required for reimbursement for recommended care.   Delice Lesch, MD, ABPMR Jodell Cipro Jerilynn Mages 02/27/2018

## 2018-02-27 NOTE — Discharge Summary (Signed)
Kyle Richardson at [place] NAME: Kyle Richardson    MR#:  [id number]  DATE OF BIRTH:  1928/05/26  DATE OF ADMISSION:  02/22/2018 ADMITTING PHYSICIAN: Lance Coon, MD  DATE OF DISCHARGE: 02/27/2018  PRIMARY CARE PHYSICIAN: Einar Pheasant, MD    ADMISSION DIAGNOSIS:  Confusion [R41.0]  DISCHARGE DIAGNOSIS:   Acute Left PCA infarct  SECONDARY DIAGNOSIS:   Past Medical History:  Diagnosis Date  . 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  s/p PTCA of the mid RCA 1/96, also  s/p  CABG x 4 7/96  . Degenerative disc disease   . Diabetes mellitus ([place])   . Glaucoma   . Hypercholesterolemia   . Hypertension   . Migraine headache    history of  . Osteoarthritis   . Sleep apnea     HOSPITAL COURSE:   [place] a84 y.o.malewho presents after being found unresponsive outside by family. When patient came to he stated that he had a visual disturbance, that he could not see on the right side. Patient is also confused  #Acute CVA--left PCA territory non hemorrhagic stroke appears embolic -CT had negative -patient awake alert oriented times two -on eliquis, statins Lipid profile OK -Echo EF 55-60%.. No cardiac source of emboli noted -Tele in NSR -seen by Neurology--significant right sided weakness due to acute stroke  #Right homonymous hemianopsia -as well as some right upper extremity weakness,  -patient already on eliquis for a fib -MRA/MRI of the brain showed left PCA infarct -PT OT speech to see patient--pt will beneift from intense rehab  #Type 2 diabetes mellitus with complication, without long-term current use of insulin (HCC) -sliding scale insulin with corresponding glucose checks -not on any med at home -started on metformin XR qd due this hospitalization  #Hypertension -permissive hypertension for 24 hours, blood pressure goal less than 220/120, hold antihypertensives  for now -PRN hydralazine -resumed metopolol  #CAD (coronary artery disease) -continue home meds  # PAF (paroxysmal atrial fibrillation) (HCC)  -on bb and oral anticoagulation - eliquis  #Hypercholesterolemia -home dose statin -lipid profile OK  #h/o Myasthenia Gravis Pt was on Mestinon 30 mg bid (sees Dr Ernst Bowler at [facility])--- now curretnly increased to 60 mg BID----then 60 mg TID per neurology note from 02/21/2018  CM/CSW for d/c planning. Pt is medically ok for discharge--pt will BENEFIT from Inpatient intense rehab d/w dr Amalia Hailey [facility] who has approved the inpt rehab. Family updated.   CONSULTS OBTAINED:  Treatment Team:  Alexis Goodell, MD  DRUG ALLERGIES:   Allergies  Allergen Reactions  . Clarithromycin Other (See Comments) and Diarrhea  . Codeine   . Flomax [Tamsulosin Hcl]   . Lamisil [Terbinafine]   . Levofloxacin Other (See Comments)    Diplopia. NEVER put patient on levoquin!  . Sertraline Other (See Comments)  . Sulfa Antibiotics     DISCHARGE MEDICATIONS:   Allergies as of 02/27/2018      Reactions   Clarithromycin Other (See Comments), Diarrhea   Codeine    Flomax [tamsulosin Hcl]    Lamisil [terbinafine]    Levofloxacin Other (See Comments)   Diplopia. NEVER put patient on levoquin!   Sertraline Other (See Comments)   Sulfa Antibiotics       Medication List    TAKE these medications   ACCU-CHEK SOFTCLIX LANCETS lancets USE AS DIRECTED   apixaban 5 MG Tabs tablet Commonly known as:  ELIQUIS Take 5 mg  by mouth 2 (two) times daily.   atorvastatin 20 MG tablet Commonly known as:  LIPITOR TAKE 1 TABLET BY MOUTH EVERY DAY   Calcium Citrate-Vitamin D 315-250 MG-UNIT Tabs Take 2 tablets by mouth daily.   FLUoxetine 10 MG capsule Commonly known as:  PROZAC TAKE 1 CAPSULE BY MOUTH EVERY DAY   fluticasone 50 MCG/ACT nasal spray Commonly known as:  FLONASE SPRAY 2 SPRAYS IN EACH NOSTRIL ONCE A DAY   latanoprost 0.005 % ophthalmic  solution Commonly known as:  XALATAN Place 1 drop into both eyes at bedtime.   loratadine 10 MG tablet Commonly known as:  CLARITIN Take 10 mg by mouth daily.   metFORMIN 500 MG 24 hr tablet Commonly known as:  GLUCOPHAGE-XR Take 1 tablet (500 mg total) by mouth daily with breakfast. Start taking on:  02/28/2018   metoprolol succinate 25 MG 24 hr tablet Commonly known as:  TOPROL-XL Take 25 mg by mouth daily. Take with or immediately following a meal.   multivitamin tablet Take 1 tablet by mouth daily.   nitroGLYCERIN 0.4 MG SL tablet Commonly known as:  NITROSTAT Place 0.4 mg under the tongue every 5 (five) minutes as needed. As directed   ONE TOUCH ULTRA TEST test strip Generic drug:  glucose blood CHECK BLOOD SUGAR TWICE A DAY   PRESERVISION AREDS 2 Caps Take 1 tablet by mouth 2 (two) times daily.   pyridostigmine 60 MG tablet Commonly known as:  MESTINON Take 1 tablet (60 mg total) by mouth 2 (two) times daily. What changed:  when to take this       If you experience worsening of your admission symptoms, develop shortness of breath, life threatening emergency, suicidal or homicidal thoughts you must seek medical attention immediately by calling 911 or calling your MD immediately  if symptoms less severe.  You Must read complete instructions/literature along with all the possible adverse reactions/side effects for all the Medicines you take and that have been prescribed to you. Take any new Medicines after you have completely understood and accept all the possible adverse reactions/side effects.   Please note  You were cared for by a hospitalist during your hospital stay. If you have any questions about your discharge medications or the care you received while you were in the hospital after you are discharged, you can call the unit and asked to speak with the hospitalist on call if the hospitalist that took care of you is not available. Once you are discharged, your  primary care physician will handle any further medical issues. Please note that NO REFILLS for any discharge medications will be authorized once you are discharged, as it is imperative that you return to your primary care physician (or establish a relationship with a primary care physician if you do not have one) for your aftercare needs so that they can reassess your need for medications and monitor your lab values. Today   SUBJECTIVE   Doing overall better. Right UE weakness +, wiggling fingers and moving right leg better per family  VITAL SIGNS:  Blood pressure (!) 173/69, pulse 62, temperature 98 F (36.7 C), temperature source Oral, resp. rate 20, height 5\' 8"  (1.727 m), weight 79.4 kg (175 lb), SpO2 96 %.  I/O:    Intake/Output Summary (Last 24 hours) at 02/27/2018 1201 Last data filed at 02/27/2018 1014 Gross per 24 hour  Intake 150 ml  Output -  Net 150 ml    PHYSICAL EXAMINATION:  GENERAL:  84 y.o.-year-old  patient lying in the bed with no acute distress.  EYES: Pupils equal, round, reactive to light and accommodation. No scleral icterus. Extraocular muscles intact.  HEENT: Head atraumatic, normocephalic. Oropharynx and nasopharynx clear. Hard on hearing, has hearing aid NECK:  Supple, no jugular venous distention. No thyroid enlargement, no tenderness.  LUNGS: Normal breath sounds bilaterally, no wheezing, rales,rhonchi or crepitation. No use of accessory muscles of respiration.  CARDIOVASCULAR: S1, S2 normal. No murmurs, rubs, or gallops.  ABDOMEN: Soft, non-tender, non-distended. Bowel sounds present. No organomegaly or mass.  EXTREMITIES: No pedal edema, cyanosis, or clubbing.  NEUROLOGIC: right hemianopia, right UE weakness > than right LE PSYCHIATRIC: The patient is alert and oriented x 2  SKIN: No obvious rash, lesion, or ulcer.   DATA REVIEW:   CBC  Recent Labs  Lab 02/25/18 0411  WBC 9.0  HGB 15.2  HCT 44.5  PLT 210    Chemistries  Recent Labs  Lab  02/22/18 1950 02/25/18 0411  NA 138  --   K 3.8  --   CL 106  --   CO2 24  --   GLUCOSE 147*  --   BUN 18  --   CREATININE 0.95 0.85  CALCIUM 8.6*  --   AST 37  --   ALT 36  --   ALKPHOS 69  --   BILITOT 0.8  --     Microbiology Results   No results found for this or any previous visit (from the past 240 hour(s)).  RADIOLOGY:  No results found.   Management plans discussed with the patient, family and they are in agreement.  CODE STATUS:     Code Status Orders  (From admission, onward)        Start     Ordered   02/23/18 0018  Full code  Continuous     02/23/18 0017    Code Status History    This patient has a current code status but no historical code status.    Advance Directive Documentation     Most Recent Value  Type of Advance Directive  Healthcare Power of Attorney, Living will  Pre-existing out of facility DNR order (yellow form or pink MOST form)  -  "MOST" Form in Place?  -      TOTAL TIME TAKING CARE OF THIS PATIENT: 40 minutes.    Fritzi Mandes M.D on 02/27/2018 at 12:01 PM  Between 7am to 6pm - Pager - [phone] After 6pm go to [email or web address] - password [facility]  Office  [phone]  CC: Primary care physician; Einar Pheasant, MD

## 2018-02-27 NOTE — Telephone Encounter (Signed)
Copied from [place] [id number]. Topic: Appointment Scheduling - Scheduling Inquiry for Clinic >> Feb 27, 2018  3:05 PM Bea Graff, NT wrote: Reason for CRM: [facility] calling, pt needing HFU within 2 weeks for confusion. Discharged on 02/27/18. Please call pt to schedule.

## 2018-02-27 NOTE — Progress Notes (Signed)
Pt family arrived to unit. Family oriented to unit and oriented to fall policy and procedure. Admission completed with Daughter Jackelyn Poling and Wife. All questions answered. Verbalized understanding. Pt resting in bed with call bell within reach, bed in lowest position and bed alarm on.  Claude Manges, LPN

## 2018-02-27 NOTE — Telephone Encounter (Signed)
I think you were going to work on getting this patient in

## 2018-02-27 NOTE — Progress Notes (Signed)
Daughter reported that patient had just received Rx on the day prior to admission to [facility] and had not picked up the meds yet.   Neurology was not fully convinced about MG diagnosis and felt that gait problems likely due to peripheral neuropathy. Diplopia lasted 30- 60 seconds at a time.  He has been having diarrhea as well as abdominal pain for last two days and daughter and wife want this medication discontinued. They also feel that patient is extremely HOH and is able to express needs when hearing aids work.

## 2018-02-27 NOTE — Progress Notes (Signed)
Pt arrived to unit via carelink.Informed pt on fall policy and oriented to unit. Pt unable to verbalize/express understanding. Will complete admission when family arrives. Claude Manges, LPN

## 2018-02-27 NOTE — H&P (Signed)
Physical Medicine and Rehabilitation Admission H&P    Chief Complaint  Patient presents with  . Code Stroke    HPI:  Kyle Richardson is an 84 year old male with history of CAD, PAF- on eliquis, T2DM, COPD, OSA- wears oral device,  HTN, intermittent diplopia with diagnosis of MG recently and was admitted on 02/22/18 after unresponsive outside by his family. History taken from chart review. Patient reported difficulty seeing on the right, was confused and right sided weakness.  MRI brain reviewed, showing left CVA. Per report, MRI/MRA brain done revealing acute L-PCA territory infarct affecting left medial temporal lobe, left hippocampus and left thalamus, small acute infarct left occipital lobe and no hemorrhage.  2D echo showed EF 60-65% with mild LVH, basal inferior hypokinesis and grade 1 DD. Carotid dopplers showed 50-69% heterogenous and partially calcified plaque at right carotid bifurcation. Dr. Tyron Russell recommended continuing Eliquis as well as aggressive management of risk factors.   He continues to have bouts of lethargy but showing improvement in activity tolerance. He continues to be limited by right sided weakness with significant flexor tone, right neglect,  Wernicke's aphasia with  moderate to severe expressive and receptive deficits and confusion. CIR recommended due to functional deficits.   Review of Systems  Unable to perform ROS: Mental acuity   Past Medical History:  Diagnosis Date  . 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  s/p PTCA of the mid RCA 1/96, also  s/p  CABG x 4 7/96  . Degenerative disc disease   . Diabetes mellitus ([place])   . Glaucoma   . Hypercholesterolemia   . Hypertension   . Migraine headache    history of  . Osteoarthritis   . Sleep apnea     Past Surgical History:  Procedure Laterality Date  . APPENDECTOMY    . CHOLECYSTECTOMY     Removed  . CORONARY ARTERY BYPASS GRAFT  7/96   x4  . HEMORRHOID SURGERY     . HERNIA REPAIR    . UPP and tonsillectomy  1/86    Family History  Problem Relation Age of Onset  . Heart disease Mother        died age 19  . Heart disease Father        and other vascular disease  . Diabetes Unknown        four siblings  . Heart disease Brother        s/p CABG  . Colon cancer Neg Hx   . Prostate cancer Neg Hx     Social History:  Married. Independent PTA. Retired.  reports that he has never smoked. He has never used smokeless tobacco. He reports that he does not drink alcohol or use drugs.    Allergies  Allergen Reactions  . Clarithromycin Other (See Comments) and Diarrhea  . Codeine   . Flomax [Tamsulosin Hcl]   . Lamisil [Terbinafine]   . Levofloxacin Other (See Comments)    Diplopia. NEVER put patient on levoquin!  . Sertraline Other (See Comments)  . Sulfa Antibiotics     Medications Prior to Admission  Medication Sig Dispense Refill  . ACCU-CHEK SOFTCLIX LANCETS lancets USE AS DIRECTED 100 each 1  . apixaban (ELIQUIS) 5 MG TABS tablet Take 5 mg by mouth 2 (two) times daily.     Marland Kitchen atorvastatin (LIPITOR) 20 MG tablet TAKE 1 TABLET BY MOUTH EVERY DAY 30 tablet 3  . Calcium Citrate-Vitamin D  315-250 MG-UNIT TABS Take 2 tablets by mouth daily.    Marland Kitchen FLUoxetine (PROZAC) 10 MG capsule TAKE 1 CAPSULE BY MOUTH EVERY DAY 30 capsule 3  . fluticasone (FLONASE) 50 MCG/ACT nasal spray SPRAY 2 SPRAYS IN EACH NOSTRIL ONCE A DAY 16 g 2  . latanoprost (XALATAN) 0.005 % ophthalmic solution Place 1 drop into both eyes at bedtime.    Marland Kitchen loratadine (CLARITIN) 10 MG tablet Take 10 mg by mouth daily.    . metoprolol succinate (TOPROL-XL) 25 MG 24 hr tablet Take 25 mg by mouth daily. Take with or immediately following a meal.     . Multiple Vitamin (MULTIVITAMIN) tablet Take 1 tablet by mouth daily.    . Multiple Vitamins-Minerals (PRESERVISION AREDS 2) CAPS Take 1 tablet by mouth 2 (two) times daily.     . nitroGLYCERIN (NITROSTAT) 0.4 MG SL tablet Place 0.4 mg under  the tongue every 5 (five) minutes as needed. As directed    . ONE TOUCH ULTRA TEST test strip CHECK BLOOD SUGAR TWICE A DAY 180 each 3  . [DISCONTINUED] pyridostigmine (MESTINON) 60 MG tablet Take 1 tablet by mouth 3 (three) times daily.      Drug Regimen Review  Drug regimen was reviewed and remains appropriate with no significant issues identified  Home: Home Living Family/patient expects to be discharged to:: Private residence Living Arrangements: Spouse/significant other Available Help at Discharge: Family Type of Home: House Home Access: Stairs to enter Technical brewer of Steps: 2 Entrance Stairs-Rails: Can reach both Home Layout: One level Bathroom Shower/Tub: Multimedia programmer: Standard Home Equipment: Grab bars - tub/shower, Civil engineer, contracting  Lives With: Spouse   Functional History: Prior Function Level of Independence: Independent Comments: Pt very independent prior to admission, driving, and loves to garden (vegetables, flowers)  Functional Status:  Mobility: Bed Mobility Overal bed mobility: Needs Assistance Bed Mobility: Sit to Supine, Supine to Sit Supine to sit: Mod assist Sit to supine: Mod assist General bed mobility comments: Gives good effort to scoot hips in supine towards edge of bed and mobilize LLE; RLE with tone and requires assist as well as at trunk, but overall improved.  Transfers Overall transfer level: Needs assistance Equipment used: None Transfers: Stand Pivot Transfers, Sit to/from Stand Sit to Stand: Max assist, +2 physical assistance, Mod assist Stand pivot transfers: Mod assist, Max assist, +2 physical assistance General transfer comment: attempt; unable to keep RLE on floor or activate LLE to initate stand Ambulation/Gait General Gait Details: not appropriate    ADL: ADL Overall ADL's : Needs assistance/impaired Eating/Feeding: Maximal assistance, Sitting Eating/Feeding Details (indicate cue type and reason): per  previous OT note Grooming: Wash/dry face, Sitting, Moderate assistance Grooming Details (indicate cue type and reason): per previous OT note Upper Body Bathing: Sitting, Moderate assistance Lower Body Bathing: Maximal assistance, Sit to/from stand Lower Body Bathing Details (indicate cue type and reason): +2 to stand Upper Body Dressing : Sitting, Moderate assistance Lower Body Dressing: Sit to/from stand, Maximal assistance Lower Body Dressing Details (indicate cue type and reason): +2 to stand Toilet Transfer: +2 for physical assistance, Maximal assistance, BSC, Stand-pivot Functional mobility during ADLs: Maximal assistance, +2 for physical assistance General ADL Comments: Pt needing verbal and tactile cues to attend to R side  Cognition: Cognition Overall Cognitive Status: Impaired/Different from baseline Arousal/Alertness: Awake/alert Orientation Level: Oriented to person, Oriented to place, Disoriented to time, Disoriented to situation Attention: Focused Focused Attention: Appears intact Memory: Impaired Memory Impairment: Storage deficit, Decreased long term  memory, Retrieval deficit, Decreased short term memory Decreased Long Term Memory: Verbal basic Decreased Short Term Memory: Verbal basic Safety/Judgment: Impaired Cognition Arousal/Alertness: Lethargic Behavior During Therapy: WFL for tasks assessed/performed Overall Cognitive Status: Impaired/Different from baseline Area of Impairment: (Speech) Safety/Judgement: Decreased awareness of deficits General Comments: R side neglect, needing verbal and tactile cues to correct   Blood pressure (!) 154/64, pulse 60, temperature 98.1 F (36.7 C), temperature source Oral, resp. rate 20, height 5\' 8"  (1.727 m), weight 79.4 kg (175 lb), SpO2 95 %. Physical Exam  Constitutional: He appears well-developed.  Frail  HENT:  Head: Normocephalic and atraumatic.  Eyes: Right eye exhibits no discharge. Left eye exhibits no discharge.    Unable to assess extraocular movements due to lack of participation  Neck: Normal range of motion. Neck supple.  Cardiovascular: Normal rate and regular rhythm.  Respiratory: Effort normal and breath sounds normal.  GI: Soft. Bowel sounds are normal.  Musculoskeletal:  No edema or tenderness in extremities  Neurological: He is alert.  Global aphasia Inconsistently following commands with limited participation. Left upper/left lower extremity appears to be 4-5/5 proximal distal Right upper extremity appears to be 3 -/5 proximal to distal Left lower extremity appears to be 2 -/5 proximal to distal  Skin: Skin is warm and dry.  Psychiatric:  Unable to assess due to mentation    Results for orders placed or performed during the hospital encounter of 02/22/18 (from the past 48 hour(s))  Glucose, capillary     Status: Abnormal   Collection Time: 02/25/18  4:49 PM  Result Value Ref Range   Glucose-Capillary 149 (H) 65 - 99 mg/dL  Glucose, capillary     Status: Abnormal   Collection Time: 02/25/18  9:06 PM  Result Value Ref Range   Glucose-Capillary 147 (H) 65 - 99 mg/dL  Glucose, capillary     Status: Abnormal   Collection Time: 02/26/18  7:59 AM  Result Value Ref Range   Glucose-Capillary 139 (H) 65 - 99 mg/dL  Glucose, capillary     Status: Abnormal   Collection Time: 02/26/18 11:59 AM  Result Value Ref Range   Glucose-Capillary 153 (H) 65 - 99 mg/dL  Glucose, capillary     Status: Abnormal   Collection Time: 02/26/18  5:11 PM  Result Value Ref Range   Glucose-Capillary 121 (H) 65 - 99 mg/dL  Glucose, capillary     Status: Abnormal   Collection Time: 02/26/18 10:02 PM  Result Value Ref Range   Glucose-Capillary 179 (H) 65 - 99 mg/dL  Glucose, capillary     Status: Abnormal   Collection Time: 02/27/18 10:01 AM  Result Value Ref Range   Glucose-Capillary 225 (H) 65 - 99 mg/dL   Comment 1 Notify RN   Glucose, capillary     Status: Abnormal   Collection Time: 02/27/18 11:58  AM  Result Value Ref Range   Glucose-Capillary 158 (H) 65 - 99 mg/dL   No results found.     Medical Problem List and Plan: 1.  Right hemiparesis and global aphasia secondary to left PCA infarct. 2.  DVT Prophylaxis/Anticoagulation: Pharmaceutical: Other (comment)--Eliquis 3. Pain Management: tylenol prn.  4. Mood: Team to provide ego support to help with frustration.  LCSW to follow for evaluation and support when appropriate.  5. Neuropsych: This patient is not capable of making decisions on his own behalf. 6. Skin/Wound Care: routine pressure relief measures.  7. Fluids/Electrolytes/Nutrition: Monitor I/O. Add full supervision at meals for safety. 8. PAF/CAD:  Monitor HR bid. ON metoprolol and Lipitor.  9. T2DM: Hgb A1C- 7.1.  Monitor BS ac/hs and use SSI for elevated BS. Now on metformin daily.  10. Myasthenia Gravis with intermittent diplopia: On mestonin 30 mg bid--to be increased to 60 mg bid per discharge summary. Monitor for SE.  11. Glaucoma: Continue Ocuvite bid and  Xalatan at bedtime.   Post Admission Physician Evaluation: 1. Preadmission assessment reviewed and changes made below. 2. Functional deficits secondary  to left PCA infarct. 3. Patient is admitted to receive collaborative, interdisciplinary care between the physiatrist, rehab nursing staff, and therapy team. 4. Patient's level of medical complexity and substantial therapy needs in context of that medical necessity cannot be provided at a lesser intensity of care such as a SNF. 5. Patient has experienced substantial functional loss from his/her baseline which was documented above under the "Functional History" and "Functional Status" headings.  Judging by the patient's diagnosis, physical exam, and functional history, the patient has potential for functional progress which will result in measurable gains while on inpatient rehab.  These gains will be of substantial and practical use upon discharge  in facilitating  mobility and self-care at the household level. 46. Physiatrist will provide 24 hour management of medical needs as well as oversight of the therapy plan/treatment and provide guidance as appropriate regarding the interaction of the two. 7. 24 hour rehab nursing will assist with bladder management, bowel management, safety, skin/wound care, disease management, medication administration and patient education  and help integrate therapy concepts, techniques,education, etc. 8. PT will assess and treat for/with: Lower extremity strength, range of motion, stamina, balance, functional mobility, safety, adaptive techniques and equipment, wound care, coping skills, pain control, stroke education. Goals are: Min A/Supervision. 9. OT will assess and treat for/with: ADL's, functional mobility, safety, upper extremity strength, adaptive techniques and equipment, wound mgt, ego support, and community reintegration.   Goals are: Min A/Supervision. Therapy may proceed with showering this patient. 10. SLP will assess and treat for/with: language, cognition.  Goals are: Min A. 11. Case Management and Social Worker will assess and treat for psychological issues and discharge planning. 12. Team conference will be held weekly to assess progress toward goals and to determine barriers to discharge. 13. Patient will receive at least 3 hours of therapy per day at least 5 days per week. 14. ELOS: 19-23 days       15. Prognosis:  good  I have personally performed a face to face diagnostic evaluation, including, but not limited to relevant history and physical exam findings, of this patient and developed relevant assessment and plan.  Additionally, I have reviewed and concur with the physician assistant's documentation above.  Delice Lesch, MD, ABPMR Bary Leriche, PA-C 02/27/2018

## 2018-02-27 NOTE — Progress Notes (Signed)
[facility] admissions - A peer to peer was completed with the medical director at [facility] and we now have approval for acute inpatient rehab admission for today.  I will contact MD and case manager to confirm admit for today.  Call me for questions.  #[phone]

## 2018-02-28 ENCOUNTER — Inpatient Hospital Stay (HOSPITAL_COMMUNITY): Payer: PPO | Admitting: Speech Pathology

## 2018-02-28 ENCOUNTER — Inpatient Hospital Stay (HOSPITAL_COMMUNITY): Payer: PPO | Admitting: Occupational Therapy

## 2018-02-28 ENCOUNTER — Inpatient Hospital Stay (HOSPITAL_COMMUNITY): Payer: PPO | Admitting: Physical Therapy

## 2018-02-28 DIAGNOSIS — I63532 Cerebral infarction due to unspecified occlusion or stenosis of left posterior cerebral artery: Secondary | ICD-10-CM

## 2018-02-28 DIAGNOSIS — I69390 Apraxia following cerebral infarction: Secondary | ICD-10-CM

## 2018-02-28 DIAGNOSIS — I482 Chronic atrial fibrillation: Secondary | ICD-10-CM

## 2018-02-28 DIAGNOSIS — R269 Unspecified abnormalities of gait and mobility: Secondary | ICD-10-CM

## 2018-02-28 DIAGNOSIS — I69320 Aphasia following cerebral infarction: Secondary | ICD-10-CM

## 2018-02-28 DIAGNOSIS — I69398 Other sequelae of cerebral infarction: Secondary | ICD-10-CM

## 2018-02-28 LAB — COMPREHENSIVE METABOLIC PANEL
ALK PHOS: 79 U/L (ref 38–126)
ALT: 25 U/L (ref 17–63)
AST: 24 U/L (ref 15–41)
Albumin: 3.1 g/dL — ABNORMAL LOW (ref 3.5–5.0)
Anion gap: 10 (ref 5–15)
BILIRUBIN TOTAL: 1.3 mg/dL — AB (ref 0.3–1.2)
BUN: 17 mg/dL (ref 6–20)
CALCIUM: 9 mg/dL (ref 8.9–10.3)
CO2: 24 mmol/L (ref 22–32)
CREATININE: 0.98 mg/dL (ref 0.61–1.24)
Chloride: 102 mmol/L (ref 101–111)
Glucose, Bld: 139 mg/dL — ABNORMAL HIGH (ref 65–99)
Potassium: 4.2 mmol/L (ref 3.5–5.1)
Sodium: 136 mmol/L (ref 135–145)
TOTAL PROTEIN: 6.8 g/dL (ref 6.5–8.1)

## 2018-02-28 LAB — CBC WITH DIFFERENTIAL/PLATELET
ABS IMMATURE GRANULOCYTES: 0 10*3/uL (ref 0.0–0.1)
Basophils Absolute: 0 10*3/uL (ref 0.0–0.1)
Basophils Relative: 0 %
Eosinophils Absolute: 0 10*3/uL (ref 0.0–0.7)
Eosinophils Relative: 0 %
HEMATOCRIT: 47.1 % (ref 39.0–52.0)
HEMOGLOBIN: 15.5 g/dL (ref 13.0–17.0)
Immature Granulocytes: 0 %
LYMPHS PCT: 24 %
Lymphs Abs: 1.9 10*3/uL (ref 0.7–4.0)
MCH: 31.3 pg (ref 26.0–34.0)
MCHC: 32.9 g/dL (ref 30.0–36.0)
MCV: 95 fL (ref 78.0–100.0)
MONO ABS: 0.9 10*3/uL (ref 0.1–1.0)
MONOS PCT: 11 %
NEUTROS ABS: 5.1 10*3/uL (ref 1.7–7.7)
Neutrophils Relative %: 65 %
Platelets: 258 10*3/uL (ref 150–400)
RBC: 4.96 MIL/uL (ref 4.22–5.81)
RDW: 13.2 % (ref 11.5–15.5)
WBC: 8 10*3/uL (ref 4.0–10.5)

## 2018-02-28 LAB — GLUCOSE, CAPILLARY
GLUCOSE-CAPILLARY: 153 mg/dL — AB (ref 65–99)
GLUCOSE-CAPILLARY: 213 mg/dL — AB (ref 65–99)
Glucose-Capillary: 117 mg/dL — ABNORMAL HIGH (ref 65–99)
Glucose-Capillary: 121 mg/dL — ABNORMAL HIGH (ref 65–99)
Glucose-Capillary: 137 mg/dL — ABNORMAL HIGH (ref 65–99)

## 2018-02-28 MED ORDER — PYRIDOSTIGMINE BROMIDE 60 MG PO TABS
60.0000 mg | ORAL_TABLET | Freq: Two times a day (BID) | ORAL | Status: DC
  Filled 2018-02-28: qty 1

## 2018-02-28 NOTE — Progress Notes (Signed)
Subjective/Complaints:   Objective: Vital Signs: Blood pressure 133/63, pulse 64, temperature 97.9 F (36.6 C), temperature source Oral, resp. rate 18, height _0  (1.727 m), weight 73.6 kg (162 lb 4.1 oz), SpO2 99 %. No results found. Results for orders placed or performed during the hospital encounter of 02/27/18 (from the past 72 hour(s))  Glucose, capillary     Status: Abnormal   Collection Time: 02/27/18  4:38 PM  Result Value Ref Range   Glucose-Capillary 107 (H) 65 - 99 mg/dL  Glucose, capillary     Status: Abnormal   Collection Time: 02/27/18  9:27 PM  Result Value Ref Range   Glucose-Capillary 167 (H) 65 - 99 mg/dL  CBC WITH DIFFERENTIAL     Status: None   Collection Time: 02/28/18  6:03 AM  Result Value Ref Range   WBC 8.0 4.0 - 10.5 K/uL   RBC 4.96 4.22 - 5.81 MIL/uL   Hemoglobin 15.5 13.0 - 17.0 g/dL   HCT 47.1 39.0 - 52.0 %   MCV 95.0 78.0 - 100.0 fL   MCH 31.3 26.0 - 34.0 pg   MCHC 32.9 30.0 - 36.0 g/dL   RDW 13.2 11.5 - 15.5 %   Platelets 258 150 - 400 K/uL   Neutrophils Relative % 65 %   Neutro Abs 5.1 1.7 - 7.7 K/uL   Lymphocytes Relative 24 %   Lymphs Abs 1.9 0.7 - 4.0 K/uL   Monocytes Relative 11 %   Monocytes Absolute 0.9 0.1 - 1.0 K/uL   Eosinophils Relative 0 %   Eosinophils Absolute 0.0 0.0 - 0.7 K/uL   Basophils Relative 0 %   Basophils Absolute 0.0 0.0 - 0.1 K/uL   Immature Granulocytes 0 %   Abs Immature Granulocytes 0.0 0.0 - 0.1 K/uL    Comment: Performed at [facility], [address], [place], [place] [address]  Comprehensive metabolic panel     Status: Abnormal   Collection Time: 02/28/18  6:03 AM  Result Value Ref Range   Sodium 136 135 - 145 mmol/L   Potassium 4.2 3.5 - 5.1 mmol/L   Chloride 102 101 - 111 mmol/L   CO2 24 22 - 32 mmol/L   Glucose, Bld 139 (H) 65 - 99 mg/dL   BUN 17 6 - 20 mg/dL   Creatinine, Ser 0.98 0.61 - 1.24 mg/dL   Calcium 9.0 8.9 - 10.3 mg/dL   Total Protein 6.8 6.5 - 8.1 g/dL   Albumin 3.1 (L) 3.5  - 5.0 g/dL   AST 24 15 - 41 U/L   ALT 25 17 - 63 U/L   Alkaline Phosphatase 79 38 - 126 U/L   Total Bilirubin 1.3 (H) 0.3 - 1.2 mg/dL   GFR calc non Af Amer >60 >60 mL/min   GFR calc Af Amer >60 >60 mL/min    Comment: (NOTE) The eGFR has been calculated using the CKD EPI equation. This calculation has not been validated in all clinical situations. eGFR's persistently <60 mL/min signify possible Chronic Kidney Disease.    Anion gap 10 5 - 15    Comment: Performed at [facility] [address], [place], [place] [address]  Glucose, capillary     Status: Abnormal   Collection Time: 02/28/18  6:31 AM  Result Value Ref Range   Glucose-Capillary 153 (H) 65 - 99 mg/dL  Glucose, capillary     Status: Abnormal   Collection Time: 02/28/18 11:35 AM  Result Value Ref Range  Glucose-Capillary 213 (H) 65 - 99 mg/dL     HEENT: normal Cardio: irregular and No murmurs Resp: CTA B/L and Unlabored GI: BS positive and Nontender nondistended Extremity:  No Edema Skin:   Intact Neuro: Confused, Abnormal Sensory Not assessed secondary to aphasia, Abnormal Motor Difficult to assess secondary to apraxia no movement to command on the right side although tone is normal, Aphasic and Apraxic Musc/Skel:  Other No pain with upper limb or lower limb range of motion General no acute distress   Assessment/Plan: 1. Functional deficits secondary to left PCA infarct with right hemiparesis, receptive aphasia which require 3+ hours per day of interdisciplinary therapy in a comprehensive inpatient rehab setting. Physiatrist is providing close team supervision and 24 hour management of active medical problems listed below. Physiatrist and rehab team continue to assess barriers to discharge/monitor patient progress toward functional and medical goals. FIM: Function - Bathing Position: Wheelchair/chair at sink Body parts bathed by patient: Chest, Abdomen Body parts bathed by helper: Left lower leg, Right  lower leg, Left upper leg, Right upper leg, Buttocks, Front perineal area, Right arm, Left arm Assist Level: 2 helpers  Function- Upper Body Dressing/Undressing What is the patient wearing?: Pull over shirt/dress Pull over shirt/dress - Perfomed by helper: Thread/unthread right sleeve, Thread/unthread left sleeve, Put head through opening, Pull shirt over trunk Assist Level: 2 helpers Function - Lower Body Dressing/Undressing What is the patient wearing?: Pants, Non-skid slipper socks Position: Wheelchair/chair at sink Pants- Performed by helper: Thread/unthread right pants leg, Pull pants up/down, Thread/unthread left pants leg Non-skid slipper socks- Performed by helper: Don/doff left sock, Don/doff right sock Assist for footwear: Dependant Assist for lower body dressing: 2 Helpers  Function - Toileting Toileting activity did not occur: No continent bowel/bladder event  Function - Air cabin crew transfer activity did not occur: Safety/medical concerns  Function - Chair/bed transfer Chair/bed transfer method: Other Chair/bed transfer assist level: Total assist (Pt < 25%) Chair/bed transfer assistive device: Mechanical lift Mechanical lift: Stedy Chair/bed transfer details: Manual facilitation for placement, Manual facilitation for weight shifting, Verbal cues for sequencing, Verbal cues for technique  Function - Locomotion: Wheelchair Will patient use wheelchair at discharge?: Yes Type: Manual Wheelchair activity did not occur: Safety/medical concerns Wheel 50 feet with 2 turns activity did not occur: Safety/medical concerns Wheel 150 feet activity did not occur: Safety/medical concerns Turns around,maneuvers to table,bed, and toilet,negotiates 3% grade,maneuvers on rugs and over doorsills: No Function - Locomotion: Ambulation Ambulation activity did not occur: Safety/medical concerns Walk 10 feet activity did not occur: Safety/medical concerns Walk 50 feet with 2  turns activity did not occur: Safety/medical concerns Walk 150 feet activity did not occur: Safety/medical concerns Walk 10 feet on uneven surfaces activity did not occur: Safety/medical concerns  Function - Comprehension Comprehension: Auditory Comprehension assistive device: Hearing aids Comprehension assist level: Understands basic 25 - 49% of the time/ requires cueing 50 - 75% of the time  Function - Expression Expression: Verbal Expression assist level: Expresses basic 25 - 49% of the time/requires cueing 50 - 75% of the time. Uses single words/gestures.  Function - Social Interaction Social Interaction assist level: Interacts appropriately 25 - 49% of time - Needs frequent redirection.  Function - Problem Solving Problem solving assist level: Solves basic 25 - 49% of the time - needs direction more than half the time to initiate, plan or complete simple activities  Function - Memory Memory assist level: Recognizes or recalls less than 25% of the time/requires cueing greater  than 75% of the time   Medical Problem List and Plan: 1.  Right hemiparesis and global aphasia secondary to left PCA infarct. Initiate rehabilitation evaluations with PT OT speech today 2.  DVT Prophylaxis/Anticoagulation: Pharmaceutical: Other (comment)--Eliquis this will serve as DVT prophylaxis as well 3. Pain Management: tylenol prn.  4. Mood: Team to provide ego support to help with frustration.  LCSW to follow for evaluation and support when appropriate.  5. Neuropsych: This patient is not capable of making decisions on his own behalf. 6. Skin/Wound Care: routine pressure relief measures.  7. Fluids/Electrolytes/Nutrition: Monitor I/O. Add full supervision at meals for safety. 8. PAF/CAD: Monitor HR bid. ON metoprolol and Lipitor.  9. T2DM: Hgb A1C- 7.1.  Monitor BS ac/hs and use SSI for elevated BS. Now on metformin daily.  10. Myasthenia Gravis with intermittent diplopia: On mestonin 30 mg bid--to  be increased to 60 mg bid per discharge summary, will start today. Monitor for SE.  11. Glaucoma: Continue Ocuvite bid and  Xalatan at bedtime.     LOS (Days) 1 A FACE TO FACE EVALUATION WAS PERFORMED  Charlett Blake 02/28/2018, 2:47 PM

## 2018-02-28 NOTE — Evaluation (Signed)
Speech Language Pathology Assessment and Plan  Patient Details  Name: Kyle Richardson MRN: [id number] Date of Birth: 10-24-1927  SLP Diagnosis: Aphasia;Cognitive Impairments  Rehab Potential: Excellent ELOS: 4 weeks     Today's Date: 02/28/2018 SLP Individual Time: 1400-1500 SLP Individual Time Calculation (min): 60 min   Problem List:  Patient Active Problem List   Diagnosis Date Noted  . Acute ischemic left PCA stroke ([place]) 02/27/2018  . Diabetes mellitus type 2 in nonobese ([facility])   . Glaucoma   . Myasthenia gravis ([place])   . CVA (cerebral vascular accident) ([place]) 02/24/2018  . Right homonymous hemianopsia 02/23/2018  . Unresponsive episode 02/22/2018  . UTI (urinary tract infection) 01/28/2018  . Abnormal chest CT 09/20/2017  . Double vision 01/21/2017  . Ascending aortic aneurysm ([place]) 12/22/2016  . Blood-tinged sputum 10/23/2016  . Cough 10/23/2016  . PAF (paroxysmal atrial fibrillation) ([place]) 09/14/2016  . Health care maintenance 12/26/2014  . Stress 07/05/2014  . Migraine 05/10/2013  . Type 2 diabetes mellitus with complication, without long-term current use of insulin ([place]) 08/20/2012  . Hypertension 08/20/2012  . Hypercholesterolemia 08/20/2012  . CAD (coronary artery disease) 08/20/2012   Past Medical History:  Past Medical History:  Diagnosis Date  . 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  s/p PTCA of the mid RCA 1/96, also  s/p  CABG x 4 7/96  . Degenerative disc disease   . Diabetes mellitus ([place])   . Glaucoma   . Hypercholesterolemia   . Hypertension   . Migraine headache    history of  . Osteoarthritis   . Sleep apnea    Past Surgical History:  Past Surgical History:  Procedure Laterality Date  . APPENDECTOMY    . CHOLECYSTECTOMY     Removed  . CORONARY ARTERY BYPASS GRAFT  7/96   x4  . HEMORRHOID SURGERY    . HERNIA REPAIR    . UPP and tonsillectomy  1/86    Assessment / Plan / Recommendation Clinical  Impression Patient is an 84 year old male with history of CAD, PAF- on eliquis, T2DM, COPD, OSA- wears oral device, HTN, intermittent diplopia with diagnosis of MG recently and was admitted on 02/22/18 after unresponsive outside by his family. History taken from chart review. Patient reported difficulty seeing on the right, was confused and right sided weakness.  MRI brain reviewed, showing left CVA. Per report, MRI/MRA brain done revealing acute L-PCA territory infarct affecting left medial temporal lobe, left hippocampus and left thalamus, small acute infarct left occipital lobe and no hemorrhage.  2D echo showed EF 60-65% with mild LVH, basal inferior hypokinesis and grade 1 DD. Carotid dopplers showed 50-69% heterogenous and partially calcified plaque at right carotid bifurcation. Dr. Tyron Russell recommended continuing Eliquis as well as aggressive management of risk factors. He continues to have bouts of lethargy but showing improvement in activity tolerance. He continues to be limited by right sided weakness with significant flexor tone, right neglect, Wernicke's aphasia with moderate to severe expressive and receptive deficits and confusion. CIR recommended due to functional deficits and patient admitted 02/27/18.   Patient demonstrates a moderate-severe aphasia impacting patient's functional communication. Patient can answer basic yes/no questions with 70% accuracy and can follow 1-step commands. However, function impacted by patient's hearing impairments and motor planning deficits. Patient's verbal expression is characterized by jargon with phonemic paraphasias which requires Max-Total A to self-monitor and correct. However, patient does demonstrates "islands" of fluent, coherent and appropriate  language. Patient also demonstrates impaired sustained attention, functional problem solving, scanning past midline to the right, recall and awareness.  Patient would benefit from skilled SLP intervention to maximize  his cognitive-linguistic function and overall functional independence prior to discharge.   auditory comprehension of basic information and verbal expression   Skilled Therapeutic Interventions          Administered a cognitive-linguistic evaluation. Please see above for details.   SLP Assessment  Patient will need skilled [facility] during CIR admission    Recommendations  Oral Care Recommendations: Oral care BID Patient destination: Home Follow up Recommendations: Home Health SLP;24 hour supervision/assistance Equipment Recommended: None recommended by SLP    SLP Frequency 3 to 5 out of 7 days   SLP Duration  SLP Intensity  SLP Treatment/Interventions 4 weeks   Minumum of 1-2 x/day, 30 to 90 minutes  Cognitive remediation/compensation;Environmental controls;Internal/external aids;Speech/Language facilitation;Therapeutic Activities;Patient/family education;Functional tasks;Cueing hierarchy    Pain No indications of pain   Prior Functioning Type of Home: House  Lives With: Spouse Available Help at Discharge: Family(daughter and other extended family members planning to arrange care, unsure if they can provide the care he will need)  Function:   Cognition Comprehension Comprehension assist level: Understands basic 50 - 74% of the time/ requires cueing 25 - 49% of the time  Expression   Expression assist level: Expresses basic 25 - 49% of the time/requires cueing 50 - 75% of the time. Uses single words/gestures.  Social Interaction Social Interaction assist level: Interacts appropriately 25 - 49% of time - Needs frequent redirection.  Problem Solving Problem solving assist level: Solves basic 25 - 49% of the time - needs direction more than half the time to initiate, plan or complete simple activities  Memory Memory assist level: Recognizes or recalls less than 25% of the time/requires cueing greater than 75% of the time    Refer to Care Plan for  Long Term Goals  Recommendations for other services: None   Discharge Criteria: Patient will be discharged from SLP if patient refuses treatment 3 consecutive times without medical reason, if treatment goals not met, if there is a change in medical status, if patient makes no progress towards goals or if patient is discharged from hospital.  The above assessment, treatment plan, treatment alternatives and goals were discussed and mutually agreed upon: by patient and by family  Kimela Malstrom 02/28/2018, 3:16 PM

## 2018-02-28 NOTE — Telephone Encounter (Signed)
Patient currently in rehab will follow

## 2018-02-28 NOTE — Evaluation (Signed)
Physical Therapy Assessment and Plan  Patient Details  Name: Kyle Richardson MRN: [id number] Date of Birth: 11-16-1927  PT Diagnosis: Cognitive deficits, Difficulty walking, Hemiplegia dominant, Impaired cognition and Muscle weakness Rehab Potential: Fair ELOS: 4 weeks   Today's Date: 02/28/2018 PT Individual Time: 1300-1355 PT Individual Time Calculation (min): 55 min    Problem List:  Patient Active Problem List   Diagnosis Date Noted  . Acute ischemic left PCA stroke ([place]) 02/27/2018  . Diabetes mellitus type 2 in nonobese (HCC)   . Glaucoma   . Myasthenia gravis ([place])   . CVA (cerebral vascular accident) ([place]) 02/24/2018  . Right homonymous hemianopsia 02/23/2018  . Unresponsive episode 02/22/2018  . UTI (urinary tract infection) 01/28/2018  . Abnormal chest CT 09/20/2017  . Double vision 01/21/2017  . Ascending aortic aneurysm ([place]) 12/22/2016  . Blood-tinged sputum 10/23/2016  . Cough 10/23/2016  . PAF (paroxysmal atrial fibrillation) ([place]) 09/14/2016  . Health care maintenance 12/26/2014  . Stress 07/05/2014  . Migraine 05/10/2013  . Type 2 diabetes mellitus with complication, without long-term current use of insulin ([place]) 08/20/2012  . Hypertension 08/20/2012  . Hypercholesterolemia 08/20/2012  . CAD (coronary artery disease) 08/20/2012    Past Medical History:  Past Medical History:  Diagnosis Date  . 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  s/p PTCA of the mid RCA 1/96, also  s/p  CABG x 4 7/96  . Degenerative disc disease   . Diabetes mellitus ([place])   . Glaucoma   . Hypercholesterolemia   . Hypertension   . Migraine headache    history of  . Osteoarthritis   . Sleep apnea    Past Surgical History:  Past Surgical History:  Procedure Laterality Date  . APPENDECTOMY    . CHOLECYSTECTOMY     Removed  . CORONARY ARTERY BYPASS GRAFT  7/96   x4  . HEMORRHOID SURGERY    . HERNIA REPAIR    . UPP and tonsillectomy  1/86     Assessment & Plan Clinical Impression: Patient is a 84 year old male with history of CAD, PAF- on eliquis, T2DM, COPD, OSA- wears oral device,  HTN, intermittent diplopia with diagnosis of MG recently and was admitted on 02/22/18 after unresponsive outside by his family. History taken from chart review. Patient reported difficulty seeing on the right, was confused and right sided weakness.  MRI brain reviewed, showing left CVA. Per report, MRI/MRA brain done revealing acute L-PCA territory infarct affecting left medial temporal lobe, left hippocampus and left thalamus, small acute infarct left occipital lobe and no hemorrhage.  2D echo showed EF 60-65% with mild LVH, basal inferior hypokinesis and grade 1 DD. Carotid dopplers showed 50-69% heterogenous and partially calcified plaque at right carotid bifurcation. Dr. Tyron Russell recommended continuing Eliquis as well as aggressive management of risk factors. He continues to have bouts of lethargy but showing improvement in activity tolerance. He continues to be limited by right sided weakness with significant flexor tone, right neglect,  Wernicke's aphasia with  moderate to severe expressive and receptive deficits and confusion. CIR recommended due to functional deficits. Patient transferred to CIR on 02/27/2018 .   Patient currently requires total with mobility secondary to muscle weakness and muscle joint tightness, decreased cardiorespiratoy endurance, impaired timing and sequencing, abnormal tone, unbalanced muscle activation, motor apraxia, ataxia and decreased motor planning, decreased visual perceptual skills and decreased visual motor skills, decreased attention to right and right side neglect, decreased  initiation, decreased attention, decreased awareness, decreased problem solving, decreased safety awareness, decreased memory and delayed processing and decreased sitting balance, decreased standing balance, decreased postural control and decreased  balance strategies.  Prior to hospitalization, patient was independent  with mobility and lived with Spouse in a House home.  Home access is 2Stairs to enter.  Patient will benefit from skilled PT intervention to maximize safe functional mobility, minimize fall risk and decrease caregiver burden for planned discharge home with 24 hour assist.  Anticipate patient will benefit from follow up [facility] at discharge. Discharge to skilled nursing facility if pt's family unable to arrange for anticipated 24/7 assist.   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, minimal physical assistance required, has disabled son as well PT Patient demonstrates impairments in the following area(s): Balance;Behavior;Endurance;Motor;Pain;Perception;Safety PT Transfers Functional Problem(s): Bed Mobility;Bed to Chair;Car;Floor;Furniture PT Locomotion Functional Problem(s): Ambulation;Wheelchair Mobility;Stairs PT Plan PT Intensity: Minimum of 1-2 x/day ,45 to 90 minutes PT Frequency: 5 out of 7 days PT Duration Estimated Length of Stay: 4 weeks PT Treatment/Interventions: Ambulation/gait training;Disease management/prevention;Pain management;Stair training;Visual/perceptual remediation/compensation;Wheelchair propulsion/positioning;Therapeutic Activities;Patient/family education;DME/adaptive equipment instruction;Balance/vestibular training;Cognitive remediation/compensation;Functional electrical stimulation;Psychosocial support;Therapeutic Exercise;UE/LE Strength taining/ROM;Skin care/wound management;Functional  mobility training;Community reintegration;Discharge planning;Neuromuscular re-education;UE/LE Coordination activities;Splinting/orthotics PT Transfers Anticipated Outcome(s): Min assist PT Locomotion Anticipated Outcome(s): Min assist gait short distance PT Recommendation Follow Up Recommendations: Home health PT Patient destination: Home(home vs SNF if family is unable to arrange care pt will likely require at discharge) Equipment Recommended: To be determined  Skilled Therapeutic Intervention  Pt in TIS upon arrival and appeared agreeable to therapy, daughter present to provide PLOF and d/c plan information. Performed functional mobility including transfers, bed mobility, and sit<>stands. Adjusted TIS headrest for neutral neck alignment and positioning. Increased time for all mobility 2/2 apraxia and motor planning impairments. Pt and daughter and instructed patient in PT Evaluation and initiated treatment intervention; see below for results. Both also educated patient in [place], rehab potential, rehab goals, and discharge recommendations. Ended session in TIS w/ quick release belt donned. Explained unit policies regarding fall precautions to daughter, daughter verbalized understanding.   PT Evaluation Precautions/Restrictions Precautions Precautions: Fall Precaution Comments: R hemiplegia, increased R hamstring tone Restrictions Weight Bearing Restrictions: No General   Vital Signs  Pain Pain Assessment Pain Scale: Faces Faces Pain Scale: Hurts even more Pain Location: (unable to distinguish 2/2 aphasia) Home Living/Prior Functioning Home Living Available Help at Discharge: Family(daughter and other extended family members planning to arrange care, unsure if they can provide the care he will need) Type of Home: House Home Access: Stairs to enter [employer] of Steps: 2 Entrance Stairs-Rails: Can reach both Home Layout: One level Bathroom Shower/Tub: Clinical cytogeneticist: Standard  Lives With: Spouse Prior Function Level of Independence: Independent with basic ADLs;Independent with homemaking with ambulation  Able to Take Stairs?: Yes Driving: Yes 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, provides mostly set-up assist w/ meals and bathing/dressing Leisure: Hobbies-yes (Comment) Comments: Likes to garden and enjoys being active Vision/Perception  Vision - Assessment Ocular Range of Motion: Restricted looking up;Restricted looking down;Restricted on the right Alignment/Gaze Preference: Gaze left Tracking/Visual Pursuits: Requires cues, head turns,  or add eye shifts to track Perception Perception: Impaired Inattention/Neglect: Does not attend to right side of body;Does not attend to right visual field(Unable to attend to R side of body/environment despite max cues) Praxis Praxis: Impaired Praxis Impairment Details: Ideation;Initiation;Motor planning;Perseveration  Cognition Overall Cognitive Status: Impaired/Different from baseline Arousal/Alertness: Awake/alert Orientation Level: Oriented to person Attention: Focused;Sustained Focused Attention: Appears intact Sustained Attention: Impaired Memory: Impaired Memory Impairment: Storage deficit;Decreased long term memory;Retrieval deficit;Decreased short term memory Awareness: Impaired Problem Solving: Impaired Behaviors: Perseveration Safety/Judgment: Impaired Sensation Sensation Light Touch: Not tested(Unable to formally test 2/2 apraxia) Light Touch Impaired Details: Impaired RUE;Impaired RLE Proprioception: Not tested(Unable to formally assess 2/2 apraxia) Proprioception Impaired Details: Impaired RUE;Impaired RLE Coordination Gross Motor Movements are Fluid and Coordinated: No Fine Motor Movements are Fluid and Coordinated: No Coordination and Movement Description: Ataxic and apraxic with extremity all movements  Finger Nose Finger Test: (Pt unable  to comprehend finger to nose) Motor  Motor Motor: Hemiplegia;Abnormal tone;Ataxia;Motor apraxia;Abnormal postural alignment and control Motor - Skilled Clinical Observations: R hemi, increased R hamstring tone, posterior and R lateral lean bias, L pushing  Mobility Bed Mobility Bed Mobility: Rolling Right;Rolling Left;Supine to Sit;Sit to Supine Rolling Right: 2: Max assist Rolling Right Details: Tactile cues for initiation;Verbal cues for technique;Manual facilitation for weight shifting;Manual facilitation for placement;Verbal cues for precautions/safety Rolling Left: 2: Max assist Rolling Left Details: Manual facilitation for weight shifting;Verbal cues for precautions/safety;Verbal cues for technique;Manual facilitation for placement;Tactile cues for initiation Supine to Sit: 1: +1 Total assist Supine to Sit Details: Tactile cues for initiation;Verbal cues for technique;Manual facilitation for weight shifting;Verbal cues for precautions/safety;Manual facilitation for placement Sit to Supine: 1: +1 Total assist Sit to Supine - Details: Manual facilitation for weight shifting;Verbal cues for technique;Tactile cues for initiation;Manual facilitation for placement;Verbal cues for precautions/safety Transfers Transfers: Yes Sit to Stand: 1: +1 Total assist Sit to Stand Details: Manual facilitation for weight shifting;Verbal cues for technique;Verbal cues for precautions/safety;Manual facilitation for placement;Manual facilitation for weight bearing;Tactile cues for initiation;Verbal cues for sequencing Stand to Sit: 1: +1 Total assist Stand to Sit Details (indicate cue type and reason): Tactile cues for initiation;Manual facilitation for weight bearing;Verbal cues for sequencing;Verbal cues for technique;Manual facilitation for weight shifting;Manual facilitation for placement;Verbal cues for precautions/safety Transfer via Lift Equipment: Stedy Locomotion  Ambulation Ambulation:  No Gait Gait: No Stairs / Additional Locomotion Stairs: No Wheelchair Mobility Wheelchair Mobility: No  Trunk/Postural Assessment  Cervical Assessment Cervical Assessment: Exceptions to WFL(forward head, rounded shoulder posture) Thoracic Assessment Thoracic Assessment: Within Functional Limits Lumbar Assessment Lumbar Assessment: Exceptions to WFL(increased stiffness, decreased trunk rotation, posterior pelvic tilt) Postural Control Postural Control: Deficits on evaluation(absent/delayed)  Balance Balance Balance Assessed: Yes Static Sitting Balance Static Sitting - Balance Support: Feet supported;No upper extremity supported Static Sitting - Level of Assistance: 4: Min assist Dynamic Sitting Balance Dynamic Sitting - Balance Support: No upper extremity supported;Feet supported Dynamic Sitting - Level of Assistance: 3: Mod assist Extremity Assessment  RUE Assessment RUE Assessment: Exceptions to [facility] RUE Strength RUE Overall Strength: Deficits RUE Tone RUE Tone: Modified [place];Moderate Modified [place]worth Scale for Grading Hypertonia RUE: More marked increase in muscle tone through most of the ROM, but affected part(s) easily moved RUE Tone Comments: Pt with difficulty following commands to motor plan movement of R UE. Has flexor synergy pattern of movement LUE Assessment LUE Assessment: Within Functional Limits RLE Assessment RLE Assessment: Exceptions to WFL(Hip musculature 3/5, knee and ankle 0/5, difficult to formally assess 2/2 apraxia) RLE  Tone RLE Tone: Moderate(hamstring and gastroc) LLE Assessment LLE Assessment: Within Functional Limits   See Function Navigator for Current Functional Status.   Refer to Care Plan for Long Term Goals  Recommendations for other services: None   Discharge Criteria: Patient will be discharged from PT if patient refuses treatment 3 consecutive times without medical reason, if treatment goals not met, if there is a change in  medical status, if patient makes no progress towards goals or if patient is discharged from hospital.  The above assessment, treatment plan, treatment alternatives and goals were discussed and mutually agreed upon: by patient and by family  Marene Gilliam K Arnette 02/28/2018, 2:10 PM

## 2018-02-28 NOTE — Progress Notes (Signed)
Jamse Arn, MD  Physician  Physical Medicine and Rehabilitation  PMR Pre-admission  Signed  Date of Service:  02/27/2018 11:46 AM       Related encounter: ED to Hosp-Admission (Discharged) from 02/22/2018 in [place] (1C)      Signed            Show:Clear all _0 Manual_1 Template_2 Copied  Added by: _3 Retta Diones, RN_4 Jamse Arn, MD   _5 Hover for details     [employer] PMR Admission Coordinator Pre-Admission Assessment  Patient: Kyle Richardson is an 84 y.o., male MRN: [id number] DOB: 01-12-1928 Height: _6  (172.7 cm) Weight: 79.4 kg (175 lb)  Insurance Information HMO:  Yes    PPO:       PCP:       IPA:       80/20:       OTHER: Plan 1 advantage plan PRIMARY: [employer]      Policy#: [id number]      Subscriber: patient CM Name: Nydia Bouton      Phone#: [phone]     Fax#: [phone] Pre-Cert#: [id number] approved for 7 days      Employer:  Retired Benefits:  Phone #: [phone]     Name: On line portal Eff. Date: 02/25/18     Deduct:  $0      Out of Pocket Max: $3400 (met $35.00)      Life Max: N/A CIR: $295 days 1-6      SNF: $20 days 1-20; $160 days 21-100 Outpatient:  Medical necessity     Co-Pay: $15/visit Home Health: 100%      Co-Pay: none DME: 80%     Co-Pay: 20% Providers: in network  Emergency Contact Information         Contact Information    Name Relation Home Work Mobile   Benicia Spouse [phone]     [name] Daughter   [phone]   Ahmon, Tosi   [phone]      Current Medical History  Patient Admitting Diagnosis:  L CVA  History of Present Illness:  Melinda.[place] 84 y.o.malewho presented to [facility] on 5/171/9 after being found unresponsive outside by family. When patient came to he stated that he had a visual disturbance, that he could not see on the right side. Patient was also confused.  Patient suffered a left PCA territory no  hemorrhagic stroke which was likely embolic.  Patient has a history of myesthenia gravis.  Patient lives with his wife and assisted wife prior to admission.  PT/OT evaluations were completed with recommendations for acute inpatient rehab admission.  Patient was also seen by SLP for expressive and receptive aphasia.  Patient to be admitted today for acute inpatient rehab program.  [facility] from [facility] has been reviewed by the rehabilitation admission coordinator and physician.  NIH Stroke scale: 18  Past Medical History      Past Medical History:  Diagnosis Date  . 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  s/p PTCA of the mid RCA 1/96, also  s/p  CABG x 4 7/96  . Degenerative disc disease   . Diabetes mellitus ([place])   . Glaucoma   . Hypercholesterolemia   . Hypertension   . Migraine headache    history of  . Osteoarthritis   . Sleep apnea     Family History   family history includes Diabetes in his unknown relative; Heart disease in his  brother, father, and mother.  Prior Rehab/Hospitalizations Has the patient had major surgery during 100 days prior to admission? No               Current Medications See MAR from [facility]  Patients Current Diet:    Heart healthy, carb mod, thin liquids  Precautions / Restrictions Precautions Precautions: Fall Restrictions Weight Bearing Restrictions: No Other Position/Activity Restrictions: Watch R ankle during standing/transfers.   Has the patient had 2 or more falls or a fall with injury in the past year?Yes.  Patient suffered 1 fall at the time of the most recent CVA and hospital admission.  Prior Activity Level Community (5-7x/wk): Went outside daily, went off the property 3-4 X a week.  Prior Functional Level Self Care: Did the patient need help bathing, dressing, using the toilet or eating?   Independent  Indoor Mobility: Did the patient need assistance with walking from room to room (with or without device)? Independent  Stairs: Did the patient need assistance with internal or external stairs (with or without device)? Independent  Functional Cognition: Did the patient need help planning regular tasks such as shopping or remembering to take medications? Independent  Home Assistive Devices / Equipment Home Assistive Devices/Equipment: Eyeglasses, Hearing aid Home Equipment: Grab bars - tub/shower, Shower seat  Prior Device Use: Indicate devices/aids used by the patient prior to current illness, exacerbation or injury? None   Prior Functional Level Current Functional Level  Bed Mobility  Independent  Max assist   Transfers  Independent  Max assist   Mobility - Walk/Wheelchair  Independent  Other(Not tried)   Upper Body Dressing  Independent  Mod assist   Lower Body Dressing  Independent  Max assist   Grooming  Independent  Mod assist   Eating/Drinking  Independent  Max assist   Toilet Transfer  Independent  Max assist   Bladder Continence   WDL  Incontinence at times   Bowel Management  WDL  Last BM 02/27/18   Stair Climbing  Independent Other(Not tried)   Counsellor  Expressive/receptive aphasia  Memory  WDL  Impaired  Cooking/Meal Prep  Independent      Housework  Independent    Money Management  Independent    Driving  Yes, driving, independent               Special needs/care consideration BiPAP/CPAP No CPM No Continuous Drip IV No Dialysis No       Life Vest No Oxygen No Special Bed No Trach Size No Wound Vac (area) No     Skin No                            Bowel mgmt: Last BM 02/27/18 Bladder mgmt: Voiding with incontinence at times Diabetic mgmt Yes, prediabetic, not on medication at home  Previous Home Environment Living Arrangements:  Spouse/significant other  Lives With: Spouse Available Help at Discharge: Family Type of Home: House Home Layout: One level Home Access: Stairs to enter Entrance Stairs-Rails: Can reach both Entrance Stairs-Number of Steps: 2 Bathroom Shower/Tub: Multimedia programmer: Verdi: No  Discharge Living Setting Plans for Discharge Living Setting: Patient's home, House, Lives with (comment)(Lives with wife.) Type of Home at Discharge: House Discharge Home Layout: One level Discharge Home Access: Stairs to enter Entrance Stairs-Number of Steps: 2 step entry Does the patient have any problems obtaining your medications?: No  Social/Family/Support  Systems Patient Roles: Spouse, Parent(Has a wife, dtr and a handicapped son.) Contact Information: Augustine Leverette - wife - [phone] Anticipated Caregiver: Wife and a cousin who lives close by Ability/Limitations of Caregiver: Wife is on a walker and husband cared for wife PTA Caregiver Availability: 24/7 Discharge Plan Discussed with Primary Caregiver: Yes Is Caregiver In Agreement with Plan?: Yes Does Caregiver/Family have Issues with Lodging/Transportation while Pt is in Rehab?: No  Goals/Additional Needs Patient/Family Goal for Rehab: PT/OT supervision to min assist goals, SLP mod I and supervision goals Expected length of stay: 22-27 days Cultural Considerations: Attends [employer] on Sundays Dietary Needs: Heart healthy, carb mod, thin liquids Equipment Needs: TBD Additional Information: Husband assisted his wife PTA.  Wife has to use a walker. Pt/Family Agrees to Admission and willing to participate: Yes Program Orientation Provided & Reviewed with Pt/Caregiver Including Roles  & Responsibilities: Yes  Patient Condition: I have reviewed all clinical information and have shared with the rehab MD.  I spoke with patient's wife, the attending MD and with unit case manager.  Patient can  benefit and can tolerate 3 hours of therapy a day.  He needs the inpatient setting to monitor his functional progress and to manage his medical problems.  I have approval from rehab MD to admit to acute inpatient rehab today.  Preadmission Screen Completed By:  Retta Diones, 02/27/2018 2:10 PM ______________________________________________________________________   Discussed status with Dr. Posey Pronto on 02/27/18 at 1249 and received telephone approval for admission today.  Admission Coordinator:  Retta Diones, time 1249/Date 02/27/18   Assessment/Plan: Diagnosis:  L CVA  1. Does the need for close, 24 hr/day  Medical supervision in concert with the patient's rehab needs make it unreasonable for this patient to be served in a less intensive setting? Yes 2. Co-Morbidities requiring supervision/potential complications: OSA, HTN, DM 3. Due to bladder management, bowel management, safety, skin/wound care, disease management and patient education, does the patient require 24 hr/day rehab nursing? Yes 4. Does the patient require coordinated care of a physician, rehab nurse, PT (1-2 hrs/day, 5 days/week), OT (1-2 hrs/day, 5 days/week) and SLP (1-2 hrs/day, 5 days/week) to address physical and functional deficits in the context of the above medical diagnosis(es)? Yes Addressing deficits in the following areas: balance, endurance, locomotion, strength, transferring, bathing, dressing, feeding, grooming, toileting, cognition, speech, language, swallowing and psychosocial support 5. Can the patient actively participate in an intensive therapy program of at least 3 hrs of therapy 5 days a week? Yes 6. The potential for patient to make measurable gains while on inpatient rehab is excellent 7. Anticipated functional outcomes upon discharge from inpatients are: min assist PT, min assist OT, min assist SLP 8. Estimated rehab length of stay to reach the above functional goals is: 23-27 days. 9. Does the  patient have adequate social supports to accommodate these discharge functional goals? Potentially 10. Anticipated D/C setting: Home 11. Anticipated post D/C treatments: HH therapy and Home excercise program 12. Overall Rehab/Functional Prognosis: good    RECOMMENDATIONS: This patient's condition is appropriate for continued rehabilitative care in the following setting: CIR Patient has agreed to participate in recommended program. Potentially Note that insurance prior authorization may be required for reimbursement for recommended care.   Delice Lesch, MD, ABPMR Retta Diones 02/27/2018          Revision History

## 2018-02-28 NOTE — Evaluation (Signed)
Occupational Therapy Assessment and Plan  Patient Details  Name: Kyle Richardson MRN: [id number] Date of Birth: 05-14-1928  OT Diagnosis: apraxia, ataxia, cognitive deficits, hemiplegia affecting dominant side and muscle weakness (generalized) Rehab Potential: Rehab Potential (ACUTE ONLY): Good ELOS: 24-28 days   Today's Date: 02/28/2018 OT Individual Time: 4081-4481 OT Individual Time Calculation (min): 75 min     Problem List:  Patient Active Problem List   Diagnosis Date Noted  . Acute ischemic left PCA stroke ([place]) 02/27/2018  . Diabetes mellitus type 2 in nonobese (HCC)   . Glaucoma   . Myasthenia gravis ([place])   . CVA (cerebral vascular accident) ([place]) 02/24/2018  . Right homonymous hemianopsia 02/23/2018  . Unresponsive episode 02/22/2018  . UTI (urinary tract infection) 01/28/2018  . Abnormal chest CT 09/20/2017  . Double vision 01/21/2017  . Ascending aortic aneurysm ([place]) 12/22/2016  . Blood-tinged sputum 10/23/2016  . Cough 10/23/2016  . PAF (paroxysmal atrial fibrillation) ([place]) 09/14/2016  . Health care maintenance 12/26/2014  . Stress 07/05/2014  . Migraine 05/10/2013  . Type 2 diabetes mellitus with complication, without long-term current use of insulin ([place]) 08/20/2012  . Hypertension 08/20/2012  . Hypercholesterolemia 08/20/2012  . CAD (coronary artery disease) 08/20/2012    Past Medical History:  Past Medical History:  Diagnosis Date  . 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  s/p PTCA of the mid RCA 1/96, also  s/p  CABG x 4 7/96  . Degenerative disc disease   . Diabetes mellitus ([place])   . Glaucoma   . Hypercholesterolemia   . Hypertension   . Migraine headache    history of  . Osteoarthritis   . Sleep apnea    Past Surgical History:  Past Surgical History:  Procedure Laterality Date  . APPENDECTOMY    . CHOLECYSTECTOMY     Removed  . CORONARY ARTERY BYPASS GRAFT  7/96   x4  . HEMORRHOID SURGERY    .  HERNIA REPAIR    . UPP and tonsillectomy  1/86    Assessment & Plan Clinical Impression: Patient is a 84 y.o. year old male with history of CAD, PAF- on eliquis, T2DM, COPD, OSA- wears oral device, HTN, intermittent diplopia with diagnosis of MG recently and was admitted on 02/22/18 after unresponsive outside by his family. History taken from chart review. Patient reported difficulty seeing on the right, was confused and right sided weakness. MRI brain reviewed, showing left CVA. Per report, MRI/MRA brain done revealing acute L-PCA territory infarct affecting left medial temporal lobe, left hippocampus and left thalamus, small acute infarct left occipital lobe and no hemorrhage. 2D echo showed EF 60-65% with mild LVH, basal inferior hypokinesis and grade 1 DD. Carotid dopplers showed 50-69% heterogenous and partially calcified plaque at right carotid bifurcation. Dr. Tyron Russell recommended continuing Eliquis as well as aggressive management of risk factors. Patient transferred to CIR on 02/27/2018 .    Patient currently requires total with basic self-care skills secondary to muscle weakness and muscle joint tightness, impaired timing and sequencing, abnormal tone, unbalanced muscle activation, motor apraxia, ataxia, decreased coordination and decreased motor planning, decreased visual perceptual skills, decreased midline orientation, decreased attention to right, right side neglect, decreased motor planning and ideational apraxia, decreased initiation, decreased attention, decreased awareness, decreased problem solving, decreased safety awareness, decreased memory and delayed processing and decreased sitting balance, decreased standing balance, decreased postural control, hemiplegia and decreased balance strategies.  Prior to hospitalization, patient could complete BADL  and iADL with independent .  Patient will benefit from skilled intervention to increase independence with basic self-care skills prior to  discharge home with care partner.  Anticipate patient will require minimal physical assistance and follow up home health.  OT - End of Session Endurance Deficit: Yes Endurance Deficit Description: Multiple rest breaks within BADL tasks OT Assessment Rehab Potential (ACUTE ONLY): Good OT Patient demonstrates impairments in the following area(s): Balance;Cognition;Endurance;Motor;Perception;Pain;Safety;Sensory;Vision OT Basic ADL's Functional Problem(s): Eating;Grooming;Bathing;Dressing;Toileting OT Transfers Functional Problem(s): Toilet;Tub/Shower OT Additional Impairment(s): Fuctional Use of Upper Extremity OT Plan OT Intensity: Minimum of 1-2 x/day, 45 to 90 minutes OT Frequency: 5 out of 7 days OT Duration/Estimated Length of Stay: 24-28 days OT Treatment/Interventions: Balance/vestibular training;Cognitive remediation/compensation;Community reintegration;Discharge planning;DME/adaptive equipment instruction;Functional mobility training;Functional electrical stimulation;Neuromuscular re-education;Pain management;Self Care/advanced ADL retraining;Psychosocial support;Patient/family education;Therapeutic Activities;Therapeutic Exercise;Splinting/orthotics;UE/LE Strength taining/ROM;UE/LE Coordination activities;Wheelchair propulsion/positioning;Visual/perceptual remediation/compensation OT Self Feeding Anticipated Outcome(s): Min A OT Basic Self-Care Anticipated Outcome(s): Min A OT Toileting Anticipated Outcome(s): Min A OT Bathroom Transfers Anticipated Outcome(s): Min A OT Recommendation Patient destination: Home Follow Up Recommendations: Home health OT Equipment Recommended: To be determined   Skilled Therapeutic Interventio OT eval completed addressing rehab process, OT purpose, POC, ELOS, and goals discussed with pt's daughter. Pt alert, but unable to state situation, place, and time. Brought pt into sitting with Max A. Pt needed Mod A progressing to min A for static siting balance  and pusher tendencies noted. Sit><stand in [place] with max A +2-then transferred over to TIS with +2 to maintain sitting balance 2/2 push to R and R flexor tone. UB bathing completed at the sink with multimodal cues to initiate bathing tasks. Pt followed 1 step commands 50% of the time, often needing OT to initiate. Max A to integrate R UE into bathing tasks with motor perseverations with R UE. Total A for UB and LB dressing seated in wc. Max+2 to stand with stedy while 1 (OT) person providing max A for balance and +2(pt's daughter) assisted with pulling up pants. Worked on R attention and R side body awareness with pt having difficulty looking past midline to the R to locate items. Pt left tilted in TIS wc at end of session with safety belt on and daughter present.   OT Evaluation Precautions/Restrictions  Precautions Precautions: Fall Precaution Comments: R hemiplegia, R flexor tone greater in LE Restrictions Weight Bearing Restrictions: No Pain  none/denies pain Home Living/Prior Functioning Home Living Available Help at Discharge: Family Type of Home: House Home Access: Stairs to enter Technical brewer of Steps: 2 Entrance Stairs-Rails: Can reach both Home Layout: One level Bathroom Shower/Tub: Multimedia programmer: Standard  Lives With: Spouse IADL History Current License: Yes IADL Comments: Primary caregiver to wife and son with special needs Prior Function Level of Independence: Independent with basic ADLs, Independent with homemaking with ambulation Driving: Yes Comments: Pt very independent prior to admission, driving, and loves to garden (vegetables, flowers) ADL ADL ADL Comments: Please see functional navigator  Vision Baseline Vision/History: Wears glasses;Glaucoma Wears Glasses: At all times Ocular Range of Motion: Impaired-to be further tested in functional context Tracking/Visual Pursuits: Requires cues, head turns, or add eye shifts to  track Perception  Perception: Impaired Inattention/Neglect: Does not attend to right visual field;Does not attend to right side of body Praxis Praxis: Impaired Praxis Impairment Details: Ideation;Initiation;Motor planning;Perseveration Cognition Overall Cognitive Status: Impaired/Different from baseline Arousal/Alertness: Awake/alert Orientation Level: Person Year: Other (Comment)(65) Month: (Unable to state month ) Day of Week: Incorrect(gave a date and not  day of the week) Memory: Impaired Memory Recall: Sock;Blue;Bed(unable to verbalize back to therapist) Attention: Focused;Sustained Focused Attention: Appears intact Sustained Attention: Impaired Awareness: Impaired Problem Solving: Impaired Behaviors: Perseveration Safety/Judgment: Impaired Sensation Sensation Light Touch: Impaired Detail Light Touch Impaired Details: Impaired RUE;Impaired RLE Proprioception: Impaired Detail Proprioception Impaired Details: Impaired RUE;Impaired RLE Coordination Gross Motor Movements are Fluid and Coordinated: No Fine Motor Movements are Fluid and Coordinated: No Coordination and Movement Description: Ataxic and apraxic with R UE and LE movements,  Finger Nose Finger Test: (Pt unable to comprehend finger to nose) Motor  Motor Motor: Hemiplegia;Abnormal tone;Ataxia;Motor apraxia;Abnormal postural alignment and control Motor - Skilled Clinical Observations: Flelxor tone through R UE and R LE Mobility  Transfers Sit to Stand: 1: +2 Total assist;From elevated surface Stand to Sit: 1: +2 Total assist;With upper extremity assist  Trunk/Postural Assessment     Balance   Extremity/Trunk Assessment RUE Assessment RUE Assessment: Exceptions to [facility] RUE Strength RUE Overall Strength: Deficits RUE Tone RUE Tone: Modified [place];Moderate Modified [place] Scale for Grading Hypertonia RUE: More marked increase in muscle tone through most of the ROM, but affected part(s) easily moved RUE  Tone Comments: Pt with difficulty following commands to motor plan movement of R UE. Has flexor synergy pattern of movement LUE Assessment LUE Assessment: Within Functional Limits   See Function Navigator for Current Functional Status.   Refer to Care Plan for Long Term Goals  Recommendations for other services: None    Discharge Criteria: Patient will be discharged from OT if patient refuses treatment 3 consecutive times without medical reason, if treatment goals not met, if there is a change in medical status, if patient makes no progress towards goals or if patient is discharged from hospital.  The above assessment, treatment plan, treatment alternatives and goals were discussed and mutually agreed upon: by patient and by family  Valma Cava 02/28/2018, 12:58 PM

## 2018-02-28 NOTE — Progress Notes (Signed)
Patient information reviewed and entered into eRehab system by Herman Mell, RN, CRRN, PPS Coordinator.  Information including medical coding and functional independence measure will be reviewed and updated through discharge.     Per nursing patient was given "Data Collection Information Summary for Patients in Inpatient Rehabilitation Facilities with attached "Privacy Act Statement-Health Care Records" upon admission.  Present in Education notebook.  

## 2018-03-01 ENCOUNTER — Inpatient Hospital Stay (HOSPITAL_COMMUNITY): Payer: PPO | Admitting: Speech Pathology

## 2018-03-01 ENCOUNTER — Inpatient Hospital Stay (HOSPITAL_COMMUNITY): Payer: PPO

## 2018-03-01 ENCOUNTER — Inpatient Hospital Stay (HOSPITAL_COMMUNITY): Payer: PPO | Admitting: Occupational Therapy

## 2018-03-01 ENCOUNTER — Inpatient Hospital Stay (HOSPITAL_COMMUNITY): Payer: PPO | Admitting: Physical Therapy

## 2018-03-01 ENCOUNTER — Other Ambulatory Visit: Payer: Self-pay

## 2018-03-01 LAB — GLUCOSE, CAPILLARY
GLUCOSE-CAPILLARY: 120 mg/dL — AB (ref 65–99)
GLUCOSE-CAPILLARY: 138 mg/dL — AB (ref 65–99)
Glucose-Capillary: 118 mg/dL — ABNORMAL HIGH (ref 65–99)
Glucose-Capillary: 162 mg/dL — ABNORMAL HIGH (ref 65–99)

## 2018-03-01 MED ORDER — PRO-STAT SUGAR FREE PO LIQD
30.0000 mL | Freq: Two times a day (BID) | ORAL | Status: DC
  Administered 2018-03-01 – 2018-04-01 (×62): 30 mL via ORAL
  Filled 2018-03-01 (×63): qty 30

## 2018-03-01 MED ORDER — BACLOFEN 5 MG HALF TABLET
5.0000 mg | ORAL_TABLET | Freq: Every day | ORAL | Status: DC
  Administered 2018-03-01 – 2018-03-05 (×5): 5 mg via ORAL
  Filled 2018-03-01 (×5): qty 1

## 2018-03-01 NOTE — Progress Notes (Signed)
Patient with significant spasticity affecting activity per PT. Will add baclofen 5 mg in evening and monitor for tolerance.

## 2018-03-01 NOTE — Progress Notes (Signed)
Occupational Therapy Session Note  Patient Details  Name: Kyle Richardson MRN: [id number] Date of Birth: 06/23/1928  Today's Date: 03/01/2018 OT Individual Time: 2725-3664 OT Individual Time Calculation (min): 60 min  OT Missed Time: 30 Minutes Missed Time Reason: X-Ray;Patient fatigue  Short Term Goals: Week 1:  OT Short Term Goal 1 (Week 1): Pt will locate 2/4 grooming items on R side of the sink with mod instructional cues. OT Short Term Goal 2 (Week 1): Pt will complete toilet transfer with max A of 1 caregiver. OT Short Term Goal 3 (Week 1): Pt will wash 4/10 body parts with multimodal cues and set-up A OT Short Term Goal 4 (Week 1): Pt will complete 1/4 steps to don shirt with min instructional cues.  Skilled Therapeutic Interventions/Progress Updates:   Session 1 Pt off the floor for x-ray, then was too tired to participate in therapy once returning.  Session 2 Pt greeted semi-reclined in bed with family present, awake and agreeable to OT treatment session. Pt came to sitting EOB with total A and increased flexor tone in R LE. Pt able to achieve static sitting with min A. Stedy used to transfer pt bed>TIS wc with Total A +2. Pts severe flexor tone kicked in with standing making it difficult for pt to achieve full hip extension. Worked on R attention and functional use of R UE with soft yellow thera-putty. Pt needed hand over hand A to initiate rolling of putty. Tried incorporating L UE at the same time with improved carryover to R hand. Motor perseveration with rolling out putty requiring multimodal cues to terminate movement on L hand. Dynavision activity focused on visual scanning past midline and initiation. Pt with difficulty following commands to push red buttons, requiring hand over hand A. OT continued to provide hand over hand and multimodal cues to get pt to scan to the R, and pt began slowly scanning past midline and OT moved pt's hand towards red light. Pt returned to room at end  of session and left semi-reclined in TIS wc with safety belt on and son-in-law present.  Therapy Documentation Precautions:  Precautions Precautions: Fall Precaution Comments: R hemiplegia, increased R hamstring tone Restrictions Weight Bearing Restrictions: No Pain:  non ADL: ADL ADL Comments: Please see functional navigator   See Function Navigator for Current Functional Status.  Therapy/Group: Individual Therapy  Valma Cava 03/01/2018, 2:48 PM

## 2018-03-01 NOTE — Progress Notes (Signed)
Physical Therapy Session Note  Patient Details  Name: Kyle Richardson MRN: [id number] Date of Birth: 1928/01/11  Today's Date: 03/01/2018 PT Individual Time: 1000-1100 PT Individual Time Calculation (min): 60 min   Short Term Goals: Week 1:  PT Short Term Goal 1 (Week 1): Pt will maintain static sitting balance w/ supervision during functional mobility PT Short Term Goal 2 (Week 1): Pt will perform sit<>stand tranfser w/ max assist x1 PT Short Term Goal 3 (Week 1): Pt will initiate gait training  PT Short Term Goal 4 (Week 1): Pt will attend to R side of body/environment w/ max cues, 100% of time  Skilled Therapeutic Interventions/Progress Updates:   Pt in supine and agreeable to therapy, no evidence of pain at rest, however moaning w/ mobility. Unable to verbalize if and where he was in pain 2/2 aphasia. Session focused on functional mobility, tolerance to upright, and tone management in RLE. Transferred to EOB w/ max assist and donned LE garments and shirt, total assist for time management. Although pt able to initiate w/ LUE w/ verbal cues. Multiple attempts at sit<>stands in stedy, ultimately requiring total assist +2 and increased time to reach standing. Transferred to [place] via stedy. Performed gentle PROM of R knee in attempts to decrease hamstring tone, able to reach full extension w/ prolonged stretching and massaging of musculature. Educated daughter (who was present throughout session) on tone management and how it is preventing some functional mobility, made PA-C aware. Attempted to perform kinetron seated in TIS for RLE tone management and NMR, however unable to attend to task and perform w/o total assist from therapist. Attempted to perform sit<>stand at rail in hallway, unable to reach full standing 2/2 increased R flexor tone despite total assist for RLE management and boosting. More successful when using standing frame. Able to tolerate standing w/ neutral LE alignment for 5 minutes.  Focused on R attention tasks in standing as well as maintaining neutral upright posture. Pt w/ L pushing and unable to move past midline w/ eyes despite max cues. Upon return to room, rearranged bed and furniture to promote attention to R side and educated daughter on R inattention impairments. Ended session in TIS and in care of daughter, all needs met.   Therapy Documentation Precautions:  Precautions Precautions: Fall Precaution Comments: R hemiplegia, increased R hamstring tone Restrictions Weight Bearing Restrictions: No  See Function Navigator for Current Functional Status.   Therapy/Group: Individual Therapy  Breea Loncar K Arnette 03/01/2018, 10:34 AM

## 2018-03-01 NOTE — Progress Notes (Signed)
Pt is too sleepy to obtain orthostatic VS this AM,

## 2018-03-01 NOTE — Progress Notes (Signed)
Start of shift, pt extremely tired, lethargic, family at bedside (daughter, son who is handicapped, niece and pt's wife), family states pt has back pain, pt HOH and unable to answer until hearing aids put in.  Pt then only giving repeat answers, not answering independently at first, cbg 121, obtained VS, pulse ox 84-88 then goes into 90's, O2 applied to see if it improved pt's cognition. Pt more awake and then O2 sats remain in 90s, will continue to assess if any further difficulty. Pt is now alert and in no distress. Family at bedside.

## 2018-03-01 NOTE — Progress Notes (Signed)
Subjective/Complaints: Per RN had episode of confusion with desaturation that improved with O2., pt slept well wtihout O2, per daughter did not wear O2 at home, discussed bloodwork ROS- unreliable due to aphasia Objective: Vital Signs: Blood pressure (!) 158/67, pulse (!) 58, temperature 98.4 F (36.9 C), temperature source Oral, resp. rate 18, height '5\' 8"'  (1.727 m), weight 73.6 kg (162 lb 4.1 oz), SpO2 92 %. No results found. Results for orders placed or performed during the hospital encounter of 02/27/18 (from the past 72 hour(s))  Glucose, capillary     Status: Abnormal   Collection Time: 02/27/18  4:38 PM  Result Value Ref Range   Glucose-Capillary 107 (H) 65 - 99 mg/dL  Glucose, capillary     Status: Abnormal   Collection Time: 02/27/18  9:27 PM  Result Value Ref Range   Glucose-Capillary 167 (H) 65 - 99 mg/dL  CBC WITH DIFFERENTIAL     Status: None   Collection Time: 02/28/18  6:03 AM  Result Value Ref Range   WBC 8.0 4.0 - 10.5 K/uL   RBC 4.96 4.22 - 5.81 MIL/uL   Hemoglobin 15.5 13.0 - 17.0 g/dL   HCT 47.1 39.0 - 52.0 %   MCV 95.0 78.0 - 100.0 fL   MCH 31.3 26.0 - 34.0 pg   MCHC 32.9 30.0 - 36.0 g/dL   RDW 13.2 11.5 - 15.5 %   Platelets 258 150 - 400 K/uL   Neutrophils Relative % 65 %   Neutro Abs 5.1 1.7 - 7.7 K/uL   Lymphocytes Relative 24 %   Lymphs Abs 1.9 0.7 - 4.0 K/uL   Monocytes Relative 11 %   Monocytes Absolute 0.9 0.1 - 1.0 K/uL   Eosinophils Relative 0 %   Eosinophils Absolute 0.0 0.0 - 0.7 K/uL   Basophils Relative 0 %   Basophils Absolute 0.0 0.0 - 0.1 K/uL   Immature Granulocytes 0 %   Abs Immature Granulocytes 0.0 0.0 - 0.1 K/uL    Comment: Performed at [facility], [address] [place], [place] [address]  Comprehensive metabolic panel     Status: Abnormal   Collection Time: 02/28/18  6:03 AM  Result Value Ref Range   Sodium 136 135 - 145 mmol/L   Potassium 4.2 3.5 - 5.1 mmol/L   Chloride 102 101 - 111 mmol/L   CO2 24 22 - 32 mmol/L    Glucose, Bld 139 (H) 65 - 99 mg/dL   BUN 17 6 - 20 mg/dL   Creatinine, Ser 0.98 0.61 - 1.24 mg/dL   Calcium 9.0 8.9 - 10.3 mg/dL   Total Protein 6.8 6.5 - 8.1 g/dL   Albumin 3.1 (L) 3.5 - 5.0 g/dL   AST 24 15 - 41 U/L   ALT 25 17 - 63 U/L   Alkaline Phosphatase 79 38 - 126 U/L   Total Bilirubin 1.3 (H) 0.3 - 1.2 mg/dL   GFR calc non Af Amer >60 >60 mL/min   GFR calc Af Amer >60 >60 mL/min    Comment: (NOTE) The eGFR has been calculated using the CKD EPI equation. This calculation has not been validated in all clinical situations. eGFR's persistently <60 mL/min signify possible Chronic Kidney Disease.    Anion gap 10 5 - 15    Comment: Performed at [facility] [address] [place], [place] [address]  Glucose, capillary     Status: Abnormal   Collection Time: 02/28/18  6:31 AM  Result Value Ref Range  Glucose-Capillary 153 (H) 65 - 99 mg/dL  Glucose, capillary     Status: Abnormal   Collection Time: 02/28/18 11:35 AM  Result Value Ref Range   Glucose-Capillary 213 (H) 65 - 99 mg/dL  Glucose, capillary     Status: Abnormal   Collection Time: 02/28/18  4:28 PM  Result Value Ref Range   Glucose-Capillary 117 (H) 65 - 99 mg/dL  Glucose, capillary     Status: Abnormal   Collection Time: 02/28/18  8:22 PM  Result Value Ref Range   Glucose-Capillary 121 (H) 65 - 99 mg/dL  Glucose, capillary     Status: Abnormal   Collection Time: 02/28/18  9:41 PM  Result Value Ref Range   Glucose-Capillary 137 (H) 65 - 99 mg/dL  Glucose, capillary     Status: Abnormal   Collection Time: 03/01/18  6:22 AM  Result Value Ref Range   Glucose-Capillary 118 (H) 65 - 99 mg/dL     HEENT: normal Cardio: irregular and No murmurs Resp: CTA B/L and Unlabored GI: BS positive and Nontender nondistended Extremity:  No Edema Skin:   Intact Neuro: Confused, Abnormal Sensory Not assessed secondary to aphasia, Abnormal Motor Difficult to assess secondary to apraxia no movement to command on  the right side although tone is normal, Aphasic and Apraxic Musc/Skel:  Other No pain with upper limb or lower limb range of motion General no acute distress   Assessment/Plan: 1. Functional deficits secondary to left PCA infarct with right hemiparesis, receptive aphasia which require 3+ hours per day of interdisciplinary therapy in a comprehensive inpatient rehab setting. Physiatrist is providing close team supervision and 24 hour management of active medical problems listed below. Physiatrist and rehab team continue to assess barriers to discharge/monitor patient progress toward functional and medical goals. FIM: Function - Bathing Position: Wheelchair/chair at sink Body parts bathed by patient: Chest, Abdomen Body parts bathed by helper: Left lower leg, Right lower leg, Left upper leg, Right upper leg, Buttocks, Front perineal area, Right arm, Left arm Assist Level: 2 helpers  Function- Upper Body Dressing/Undressing What is the patient wearing?: Pull over shirt/dress Pull over shirt/dress - Perfomed by helper: Thread/unthread right sleeve, Thread/unthread left sleeve, Put head through opening, Pull shirt over trunk Assist Level: 2 helpers Function - Lower Body Dressing/Undressing What is the patient wearing?: Pants, Non-skid slipper socks Position: Wheelchair/chair at sink Pants- Performed by helper: Thread/unthread right pants leg, Pull pants up/down, Thread/unthread left pants leg Non-skid slipper socks- Performed by helper: Don/doff left sock, Don/doff right sock Assist for footwear: Dependant Assist for lower body dressing: 2 Helpers  Function - Toileting Toileting activity did not occur: No continent bowel/bladder event  Function - Air cabin crew transfer activity did not occur: Safety/medical concerns  Function - Chair/bed transfer Chair/bed transfer method: Other Chair/bed transfer assist level: Total assist (Pt < 25%) Chair/bed transfer assistive device:  Mechanical lift Mechanical lift: Stedy Chair/bed transfer details: Manual facilitation for placement, Manual facilitation for weight shifting, Verbal cues for sequencing, Verbal cues for technique  Function - Locomotion: Wheelchair Will patient use wheelchair at discharge?: Yes Type: Manual Wheelchair activity did not occur: Safety/medical concerns Wheel 50 feet with 2 turns activity did not occur: Safety/medical concerns Wheel 150 feet activity did not occur: Safety/medical concerns Turns around,maneuvers to table,bed, and toilet,negotiates 3% grade,maneuvers on rugs and over doorsills: No Function - Locomotion: Ambulation Ambulation activity did not occur: Safety/medical concerns Walk 10 feet activity did not occur: Safety/medical concerns Walk 50 feet with 2 turns  activity did not occur: Safety/medical concerns Walk 150 feet activity did not occur: Safety/medical concerns Walk 10 feet on uneven surfaces activity did not occur: Safety/medical concerns  Function - Comprehension Comprehension: Auditory Comprehension assistive device: Hearing aids Comprehension assist level: Understands basic 50 - 74% of the time/ requires cueing 25 - 49% of the time  Function - Expression Expression: Verbal Expression assist level: Expresses basic 25 - 49% of the time/requires cueing 50 - 75% of the time. Uses single words/gestures.  Function - Social Interaction Social Interaction assist level: Interacts appropriately 25 - 49% of time - Needs frequent redirection.  Function - Problem Solving Problem solving assist level: Solves basic 25 - 49% of the time - needs direction more than half the time to initiate, plan or complete simple activities  Function - Memory Memory assist level: Recognizes or recalls less than 25% of the time/requires cueing greater than 75% of the time   Medical Problem List and Plan: 1.  Right hemiparesis and global aphasia secondary to embolic left PCA  infarct. Initiate rehabilitation evaluations with PT OT speech today 2.  DVT Prophylaxis/Anticoagulation: Pharmaceutical: Other (comment)--Eliquis this will serve as DVT prophylaxis as well 3. Pain Management: tylenol prn.  4. Mood: Team to provide ego support to help with frustration.  LCSW to follow for evaluation and support when appropriate.  5. Neuropsych: This patient is not capable of making decisions on his own behalf. 6. Skin/Wound Care: routine pressure relief measures.  7. Fluids/Electrolytes/Nutrition: Monitor I/O. Add full supervision at meals for safety., CMET normal except for low alb 8. PAF/CAD: Monitor HR bid. ON metoprolol and Lipitor.  Vitals:   02/28/18 2023 03/01/18 0526  BP: (!) 145/77 (!) 158/67  Pulse: 65 (!) 58  Resp: 18 18  Temp: 98.7 F (37.1 C) 98.4 F (36.9 C)  SpO2: 99% 92%  mild bradycardia, pressures ok for now may tighten control next wk 9. T2DM: Hgb A1C- 7.1.  Monitor BS ac/hs and use SSI for elevated BS. Now on metformin daily. Having GI distress will D/C 10. Myasthenia Gravis with intermittent diplopia: On mestonin 30 mg bid--to be increased to 60 mg bid per discharge summary, will start today. Monitor for SE.  11. Glaucoma: Continue Ocuvite bid and  Xalatan at bedtime.  12.  Hypoalb- prostat 13.  Reports of hypoxemia responding to O2 would check CXR, hx Gr 1 diastolic dysfunction  LOS (Days) 2 A FACE TO FACE EVALUATION WAS PERFORMED  Charlett Blake 03/01/2018, 7:17 AM

## 2018-03-01 NOTE — Progress Notes (Signed)
Pt daughter states she and Dr Read Drivers agreed pt should not take metformin, daughter is worried pt will get the medication if they are not here. Daughter does not want pt to receive metformin.  Nursing is aware, will check with Dr Letta Pate in the AM.

## 2018-03-01 NOTE — Progress Notes (Signed)
Speech Language Pathology Daily Session Note  Patient Details  Name: Kyle Richardson MRN: [id number] Date of Birth: 14-Sep-1928  Today's Date: 03/01/2018 SLP Individual Time: 1000-1053 SLP Individual Time Calculation (min): 53 min  Short Term Goals: Week 1: SLP Short Term Goal 1 (Week 1): Patient will scan to midline in 50% of opportunities with Max A multimodal cues.  SLP Short Term Goal 2 (Week 1): Patient will demonstrate sustained attention to tasks for ~10 minutes with Min A verbal cues for redirection.  SLP Short Term Goal 3 (Week 1): Patient will follow basic commands with 75% accuracy and Mod A verbal cues.  SLP Short Term Goal 4 (Week 1): Patient will name functional items with 75% accuracy and Max A multimodal cues.  SLP Short Term Goal 5 (Week 1): Patient will verbally respond to open-ended questions accurately in 50% of opportuniteis with Max A multimodal cues.   Skilled Therapeutic Interventions: Skilled treatment session focused on communication goals. Upon arrival, patient was asleep in wheelchair and appeared more lethargic compared to yesterday's session. Patient initially required Max A verbal and tactile cues for arousal but was then able to maintain alertness with overall Min A verbal cues throughout session. Patient identified functional items from a field of 2 (placed in right field of environment) with 100% accuracy and extra time with multiple repetitions in order to process the task. Patient also answered open-ended questions about the functional item with 25% accuracy and Max A multimodal cues. Overall, patient demonstrated decreased jargon but increased verbal perseveration this session. Patient was transferred back to bed via the [facility] with total + 2 assist in order to go off the unit for an X-ray. Patient left supine in bed with alarm on and all needs within reach. Continue with current plan of care.      Function:   Cognition Comprehension Comprehension assist level:  Understands basic 50 - 74% of the time/ requires cueing 25 - 49% of the time  Expression   Expression assist level: Expresses basic 25 - 49% of the time/requires cueing 50 - 75% of the time. Uses single words/gestures.  Social Interaction Social Interaction assist level: Interacts appropriately 25 - 49% of time - Needs frequent redirection.  Problem Solving Problem solving assist level: Solves basic less than 25% of the time - needs direction nearly all the time or does not effectively solve problems and may need a restraint for safety  Memory Memory assist level: Recognizes or recalls less than 25% of the time/requires cueing greater than 75% of the time    Pain No/Denies Pain   Therapy/Group: Individual Therapy  Tenecia Ignasiak 03/01/2018, 4:21 PM

## 2018-03-02 ENCOUNTER — Inpatient Hospital Stay (HOSPITAL_COMMUNITY): Payer: PPO | Admitting: Physical Therapy

## 2018-03-02 ENCOUNTER — Inpatient Hospital Stay (HOSPITAL_COMMUNITY): Payer: PPO

## 2018-03-02 LAB — GLUCOSE, CAPILLARY
Glucose-Capillary: 120 mg/dL — ABNORMAL HIGH (ref 65–99)
Glucose-Capillary: 155 mg/dL — ABNORMAL HIGH (ref 65–99)
Glucose-Capillary: 131 mg/dL — ABNORMAL HIGH (ref 65–99)
Glucose-Capillary: 156 mg/dL — ABNORMAL HIGH (ref 65–99)

## 2018-03-02 NOTE — Progress Notes (Signed)
Physical Therapy Session Note  Patient Details  Name: Kyle Richardson MRN: [id number] Date of Birth: September 07, 1928  Today's Date: 03/02/2018 PT Individual Time: 1030-1125 PT Individual Time Calculation (min): 55 min   Short Term Goals: Week 1:  PT Short Term Goal 1 (Week 1): Pt will maintain static sitting balance w/ supervision during functional mobility PT Short Term Goal 2 (Week 1): Pt will perform sit<>stand tranfser w/ max assist x1 PT Short Term Goal 3 (Week 1): Pt will initiate gait training  PT Short Term Goal 4 (Week 1): Pt will attend to R side of body/environment w/ max cues, 100% of time  Skilled Therapeutic Interventions/Progress Updates:   Pt in supine, daughter present, and agreeable to therapy, no evidence of pain at rest. Some moaning w/ mobility, suspect in pt's back, but decreased compared to yesterday. Session focused on blocked practice of sit<>stands in stedy. Supine to sit w/ max assist to R side, max verbal, manual, and tactile cues to transfer to R side of bed. Performed stedy transfer to TIS w/ increased time, multiple attempts at standing, and total assist +2. Daughter providing +2 assist this session and educated on body mechanics to assist in addition to reminding her that she does not have to help, this therapist can get assist from NT. She insists that she feels able and willing to help and wants to learn his care as much as possible. Max assist to reposition in TIS and total assist to transport to/from gym. Performed 2-3 sit<>stands to stedy w/ total assist +2, max facilitation of technique and max verbal, tactile, and manual cues to decreased L pushing, and manual facilitation of neutral upright posture. Yoga block in place to keep RLE from flexing underneath chair 2/2 flexor tone. Flexor tone improved this date compared to previous date. Able to achieve full R knee extension w/ minimal passive stretching. Returned to room via w/c and ended session tilted in TIS, in care  of daughter and all needs met.   Therapy Documentation Precautions:  Precautions Precautions: Fall Precaution Comments: R hemiplegia, increased R hamstring tone Restrictions Weight Bearing Restrictions: No Pain: Pain Assessment Pain Scale: Faces Faces Pain Scale: No hurt  See Function Navigator for Current Functional Status.   Therapy/Group: Individual Therapy  Gunner Iodice K Arnette 03/02/2018, 11:27 AM

## 2018-03-02 NOTE — Progress Notes (Signed)
Subjective/Complaints: Working with SLP this am ROS- unreliable due to aphasia Objective: Vital Signs: Blood pressure (!) 153/63, pulse (!) 57, temperature (!) 97.5 F (36.4 C), temperature source Oral, resp. rate 14, height '5\' 8"'  (1.727 m), weight 73.3 kg (161 lb 9.6 oz), SpO2 95 %. Dg Chest 2 View  Result Date: 03/01/2018 CLINICAL DATA:  Hypoxia EXAM: CHEST - 2 VIEW COMPARISON:  10/23/2016 FINDINGS: Cardiac shadow is stable. Postsurgical changes are again seen. The lungs are well aerated bilaterally with mild left basilar atelectasis. No focal infiltrate or effusion is seen. No bony abnormality is noted. IMPRESSION: Mild left basilar atelectasis. Electronically Signed   By: Inez Catalina M.D.   On: 03/01/2018 11:12   Results for orders placed or performed during the hospital encounter of 02/27/18 (from the past 72 hour(s))  Glucose, capillary     Status: Abnormal   Collection Time: 02/27/18  4:38 PM  Result Value Ref Range   Glucose-Capillary 107 (H) 65 - 99 mg/dL  Glucose, capillary     Status: Abnormal   Collection Time: 02/27/18  9:27 PM  Result Value Ref Range   Glucose-Capillary 167 (H) 65 - 99 mg/dL  CBC WITH DIFFERENTIAL     Status: None   Collection Time: 02/28/18  6:03 AM  Result Value Ref Range   WBC 8.0 4.0 - 10.5 K/uL   RBC 4.96 4.22 - 5.81 MIL/uL   Hemoglobin 15.5 13.0 - 17.0 g/dL   HCT 47.1 39.0 - 52.0 %   MCV 95.0 78.0 - 100.0 fL   MCH 31.3 26.0 - 34.0 pg   MCHC 32.9 30.0 - 36.0 g/dL   RDW 13.2 11.5 - 15.5 %   Platelets 258 150 - 400 K/uL   Neutrophils Relative % 65 %   Neutro Abs 5.1 1.7 - 7.7 K/uL   Lymphocytes Relative 24 %   Lymphs Abs 1.9 0.7 - 4.0 K/uL   Monocytes Relative 11 %   Monocytes Absolute 0.9 0.1 - 1.0 K/uL   Eosinophils Relative 0 %   Eosinophils Absolute 0.0 0.0 - 0.7 K/uL   Basophils Relative 0 %   Basophils Absolute 0.0 0.0 - 0.1 K/uL   Immature Granulocytes 0 %   Abs Immature Granulocytes 0.0 0.0 - 0.1 K/uL    Comment: Performed at  [facility], [address] [place],  [address]  Comprehensive metabolic panel     Status: Abnormal   Collection Time: 02/28/18  6:03 AM  Result Value Ref Range   Sodium 136 135 - 145 mmol/L   Potassium 4.2 3.5 - 5.1 mmol/L   Chloride 102 101 - 111 mmol/L   CO2 24 22 - 32 mmol/L   Glucose, Bld 139 (H) 65 - 99 mg/dL   BUN 17 6 - 20 mg/dL   Creatinine, Ser 0.98 0.61 - 1.24 mg/dL   Calcium 9.0 8.9 - 10.3 mg/dL   Total Protein 6.8 6.5 - 8.1 g/dL   Albumin 3.1 (L) 3.5 - 5.0 g/dL   AST 24 15 - 41 U/L   ALT 25 17 - 63 U/L   Alkaline Phosphatase 79 38 - 126 U/L   Total Bilirubin 1.3 (H) 0.3 - 1.2 mg/dL   GFR calc non Af Amer >60 >60 mL/min   GFR calc Af Amer >60 >60 mL/min    Comment: (NOTE) The eGFR has been calculated using the CKD EPI equation. This calculation has not been validated in all clinical situations. eGFR's persistently <60 mL/min signify possible Chronic  Kidney Disease.    Anion gap 10 5 - 15    Comment: Performed at [facility] [address] [place], [place] [address]  Glucose, capillary     Status: Abnormal   Collection Time: 02/28/18  6:31 AM  Result Value Ref Range   Glucose-Capillary 153 (H) 65 - 99 mg/dL  Glucose, capillary     Status: Abnormal   Collection Time: 02/28/18 11:35 AM  Result Value Ref Range   Glucose-Capillary 213 (H) 65 - 99 mg/dL  Glucose, capillary     Status: Abnormal   Collection Time: 02/28/18  4:28 PM  Result Value Ref Range   Glucose-Capillary 117 (H) 65 - 99 mg/dL  Glucose, capillary     Status: Abnormal   Collection Time: 02/28/18  8:22 PM  Result Value Ref Range   Glucose-Capillary 121 (H) 65 - 99 mg/dL  Glucose, capillary     Status: Abnormal   Collection Time: 02/28/18  9:41 PM  Result Value Ref Range   Glucose-Capillary 137 (H) 65 - 99 mg/dL  Glucose, capillary     Status: Abnormal   Collection Time: 03/01/18  6:22 AM  Result Value Ref Range   Glucose-Capillary 118 (H) 65 - 99 mg/dL  Glucose,  capillary     Status: Abnormal   Collection Time: 03/01/18 11:25 AM  Result Value Ref Range   Glucose-Capillary 162 (H) 65 - 99 mg/dL  Glucose, capillary     Status: Abnormal   Collection Time: 03/01/18  4:49 PM  Result Value Ref Range   Glucose-Capillary 120 (H) 65 - 99 mg/dL  Glucose, capillary     Status: Abnormal   Collection Time: 03/01/18  9:40 PM  Result Value Ref Range   Glucose-Capillary 138 (H) 65 - 99 mg/dL  Glucose, capillary     Status: Abnormal   Collection Time: 03/02/18  6:33 AM  Result Value Ref Range   Glucose-Capillary 120 (H) 65 - 99 mg/dL     HEENT: normal Cardio: irregular and No murmurs Resp: CTA B/L and Unlabored GI: BS positive and Nontender nondistended Extremity:  No Edema Skin:   Intact Neuro: Confused, Abnormal Sensory Not assessed secondary to aphasia, Abnormal Motor Difficult to assess secondary to apraxia no movement to command on the right side although tone is normal, Aphasic and Apraxic Musc/Skel:  Other No pain with upper limb or lower limb range of motion General no acute distress   Assessment/Plan: 1. Functional deficits secondary to left PCA infarct with right hemiparesis, receptive aphasia which require 3+ hours per day of interdisciplinary therapy in a comprehensive inpatient rehab setting. Physiatrist is providing close team supervision and 24 hour management of active medical problems listed below. Physiatrist and rehab team continue to assess barriers to discharge/monitor patient progress toward functional and medical goals. FIM: Function - Bathing Position: Wheelchair/chair at sink Body parts bathed by patient: Chest, Abdomen Body parts bathed by helper: Left lower leg, Right lower leg, Left upper leg, Right upper leg, Buttocks, Front perineal area, Right arm, Left arm Assist Level: 2 helpers  Function- Upper Body Dressing/Undressing What is the patient wearing?: Pull over shirt/dress Pull over shirt/dress - Perfomed by helper:  Thread/unthread right sleeve, Thread/unthread left sleeve, Put head through opening, Pull shirt over trunk Assist Level: 2 helpers Function - Lower Body Dressing/Undressing What is the patient wearing?: Pants, Non-skid slipper socks Position: Wheelchair/chair at sink Pants- Performed by helper: Thread/unthread right pants leg, Pull pants up/down, Thread/unthread left pants leg Non-skid slipper socks-  Performed by helper: Don/doff left sock, Don/doff right sock Assist for footwear: Dependant Assist for lower body dressing: 2 Helpers  Function - Toileting Toileting activity did not occur: No continent bowel/bladder event  Function - Air cabin crew transfer activity did not occur: Safety/medical concerns  Function - Chair/bed transfer Chair/bed transfer method: Other Chair/bed transfer assist level: 2 helpers Chair/bed transfer assistive device: Mechanical lift Mechanical lift: Stedy Chair/bed transfer details: Manual facilitation for placement, Manual facilitation for weight shifting, Verbal cues for sequencing, Verbal cues for technique  Function - Locomotion: Wheelchair Will patient use wheelchair at discharge?: Yes Type: Manual Wheelchair activity did not occur: Safety/medical concerns Wheel 50 feet with 2 turns activity did not occur: Safety/medical concerns Wheel 150 feet activity did not occur: Safety/medical concerns Turns around,maneuvers to table,bed, and toilet,negotiates 3% grade,maneuvers on rugs and over doorsills: No Function - Locomotion: Ambulation Ambulation activity did not occur: Safety/medical concerns Walk 10 feet activity did not occur: Safety/medical concerns Walk 50 feet with 2 turns activity did not occur: Safety/medical concerns Walk 150 feet activity did not occur: Safety/medical concerns Walk 10 feet on uneven surfaces activity did not occur: Safety/medical concerns  Function - Comprehension Comprehension: Auditory Comprehension assistive  device: Hearing aids Comprehension assist level: Understands basic 50 - 74% of the time/ requires cueing 25 - 49% of the time  Function - Expression Expression: Verbal Expression assist level: Expresses basic 25 - 49% of the time/requires cueing 50 - 75% of the time. Uses single words/gestures.  Function - Social Interaction Social Interaction assist level: Interacts appropriately 25 - 49% of time - Needs frequent redirection.  Function - Problem Solving Problem solving assist level: Solves basic less than 25% of the time - needs direction nearly all the time or does not effectively solve problems and may need a restraint for safety  Function - Memory Memory assist level: Recognizes or recalls less than 25% of the time/requires cueing greater than 75% of the time Patient normally able to recall (first 3 days only): That he or she is in a hospital   Medical Problem List and Plan: 1.  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 Cont PT OT speech today 2.  DVT Prophylaxis/Anticoagulation: Pharmaceutical: Other (comment)--Eliquis this will serve as DVT prophylaxis as well 3. Pain Management: tylenol prn.  4. Mood: Team to provide ego support to help with frustration.  LCSW to follow for evaluation and support when appropriate.  5. Neuropsych: This patient is not capable of making decisions on his own behalf. 6. Skin/Wound Care: routine pressure relief measures.  7. Fluids/Electrolytes/Nutrition: Monitor I/O. Add full supervision at meals for safety., CMET normal except for low alb 8. PAF/CAD: Monitor HR bid. ON metoprolol and Lipitor.  Vitals:   03/01/18 2058 03/02/18 0535  BP: (!) 155/61 (!) 153/63  Pulse: 62 (!) 57  Resp: 20 14  Temp: 98.3 F (36.8 C) (!) 97.5 F (36.4 C)  SpO2: 97% 95%  mild bradycardia, pressures ok for now may tighten control next wk 9. T2DM: Hgb A1C- 7.1.  Monitor BS ac/hs and use SSI for elevated BS. Now on metformin daily.  Due to  GI distress will D/C CBG (last 3)  Recent Labs    03/01/18 1649 03/01/18 2140 03/02/18 0633  GLUCAP 120* 138* 120*  Controlled 5/26  10. Myasthenia Gravis with intermittent diplopia: On mestonin 30 mg bid--to be increased to 60 mg bid per discharge summary, daughter doesn't want him to have this, had EMG  confirmation. Monitor for fatiguability , ocular symptoms, if this is limiting therapy will revisit this with daughter as pt cannot make a decision 11. Glaucoma: Continue Ocuvite bid and  Xalatan at bedtime.  12.  Hypoalb- prostat 13.  Reports of hypoxemia responding to O2  CXR,showed bibasilar atelectasis-IS  LOS (Days) 3 A FACE TO FACE EVALUATION WAS PERFORMED  Charlett Blake 03/02/2018, 9:56 AM

## 2018-03-02 NOTE — Progress Notes (Signed)
Inpatient [facility] Individual Statement of Services  Patient Name:  Kyle Richardson  Date:  03/02/2018  Welcome to the [facility].  Our goal is to provide you with an individualized program based on your diagnosis and situation, designed to meet your specific needs.  With this comprehensive rehabilitation program, you will be expected to participate in at least 3 hours of rehabilitation therapies Monday-Friday, with modified therapy programming on the weekends.  Your rehabilitation program will include the following services:  Physical Therapy (PT), Occupational Therapy (OT), Speech Therapy (ST), 24 hour per day rehabilitation nursing, Neuropsychology, Case Management (Social Worker), Rehabilitation Medicine, Nutrition Services and Pharmacy Services  Weekly team conferences will be held on Wednesdays to discuss your progress.  Your Social Worker will talk with you frequently to get your input and to update you on team discussions.  Team conferences with you and your family in attendance may also be held.  Expected length of stay:  24 to 28 days  Overall anticipated outcome:  Minimal assistance with moderate assistance for stairs and car transfers  Depending on your progress and recovery, your program may change. Your Social Worker will coordinate services and will keep you informed of any changes. Your Social Worker's name and contact numbers are listed  below.  The following services may also be recommended but are not provided by the [place] will be made to provide these services after discharge if needed.  Arrangements include referral to agencies that provide these services.  Your insurance has been verified to be:  [employer] Your primary doctor is:  Dr. Einar Pheasant  Pertinent information will be shared  with your doctor and your insurance company.  Social Worker:  Alfonse Alpers, LCSW  [phone] or [phone]  Information discussed with and copy given to patient by: Trey Sailors, 03/02/2018, 1:03 AM

## 2018-03-02 NOTE — Progress Notes (Signed)
Occupational Therapy Session Note  Patient Details  Name: Kyle Richardson MRN: [id number] Date of Birth: 17-Jan-1928  Today's Date: 03/02/2018 OT Individual Time: 1335-1501 OT Individual Time Calculation (min): 86 min    Short Term Goals: Week 1:  OT Short Term Goal 1 (Week 1): Pt will locate 2/4 grooming items on R side of the sink with mod instructional cues. OT Short Term Goal 2 (Week 1): Pt will complete toilet transfer with max A of 1 caregiver. OT Short Term Goal 3 (Week 1): Pt will wash 4/10 body parts with multimodal cues and set-up A OT Short Term Goal 4 (Week 1): Pt will complete 1/4 steps to don shirt with min instructional cues.  Skilled Therapeutic Interventions/Progress Updates:    Pt sitting up in w/c with daughter present agreeable to therapy. Session focused on initiation of tasks, sequencing, and retraining b/d tasks. Multimodal cueing and HOH provided to initiate bathing tasks, however once started, pt was able to continue and thoroughly complete. HOH required to terminate bathing task. Pt able to don shirt with mod A with backward chaining. Extensive education provided to family throughout session re condition insight, type of cueing provided, and CVA rehab. Increased time required for pt to problem solve and initiate tasks. Pt presented with several familiar objects with no vc and with increased time, pt able to correctly identify/use toothbrush and comb. Pt required frequent manual facilitation of R UE/LE d/t poor proprioception during tasks and associated movements bilaterally. Total A +2 provided to stand in stedy to perform brief change. When sitting in stedy, pt able to initiate, wash ,and terminate task anterior peri area with 1 vc. Pt completed oral care at sink with mod A for thoroughness. Pt's R LE was secured with theraband to footplate d/t extensive flexor tone. Pt's daughter shared that pt is an avid gardener and loves outdoors. Pt was transported outside hospital and  provided with vc for environment scanning. Pt tolerated passive prolonged stretch of R UE to promote muscle elasticity. Pt returned to room and left in w/c with QRB donned and family present.   Therapy Documentation Precautions:  Precautions Precautions: Fall Precaution Comments: R hemiplegia, increased R hamstring tone Restrictions Weight Bearing Restrictions: No  Therapy Vitals Temp: 97.7 F (36.5 C) Temp Source: Oral Pulse Rate: 72 Resp: 16 BP: (!) 157/82 Patient Position (if appropriate): Sitting Oxygen Therapy SpO2: 92 % O2 Device: Room Air Pain: Pain Assessment Pain Scale: Faces Pain Score: 0-No pain Faces Pain Scale: No hurt ADL: ADL ADL Comments: Please see functional navigator   See Function Navigator for Current Functional Status.   Therapy/Group: Individual Therapy  Curtis Sites 03/02/2018, 3:17 PM

## 2018-03-02 NOTE — Progress Notes (Signed)
Social Work Assessment and Plan  Patient Details  Name: Kyle Richardson MRN: [id number] Date of Birth: 19-Jun-1928  Today's Date: 02/28/2018  Problem List:  Patient Active Problem List   Diagnosis Date Noted  . Acute ischemic left PCA stroke ([place]) 02/27/2018  . Diabetes mellitus type 2 in nonobese (HCC)   . Glaucoma   . Myasthenia gravis ([place])   . CVA (cerebral vascular accident) ([place]) 02/24/2018  . Right homonymous hemianopsia 02/23/2018  . Unresponsive episode 02/22/2018  . UTI (urinary tract infection) 01/28/2018  . Abnormal chest CT 09/20/2017  . Double vision 01/21/2017  . Ascending aortic aneurysm ([place]) 12/22/2016  . Blood-tinged sputum 10/23/2016  . Cough 10/23/2016  . PAF (paroxysmal atrial fibrillation) ([place]) 09/14/2016  . Health care maintenance 12/26/2014  . Stress 07/05/2014  . Migraine 05/10/2013  . Type 2 diabetes mellitus with complication, without long-term current use of insulin ([place]) 08/20/2012  . Hypertension 08/20/2012  . Hypercholesterolemia 08/20/2012  . CAD (coronary artery disease) 08/20/2012   Past Medical History:  Past Medical History:  Diagnosis Date  . 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  s/p PTCA of the mid RCA 1/96, also  s/p  CABG x 4 7/96  . Degenerative disc disease   . Diabetes mellitus ([place])   . Glaucoma   . Hypercholesterolemia   . Hypertension   . Migraine headache    history of  . Osteoarthritis   . Sleep apnea    Past Surgical History:  Past Surgical History:  Procedure Laterality Date  . APPENDECTOMY    . CHOLECYSTECTOMY     Removed  . CORONARY ARTERY BYPASS GRAFT  7/96   x4  . HEMORRHOID SURGERY    . HERNIA REPAIR    . UPP and tonsillectomy  1/86   Social History:  reports that he has never smoked. He has never used smokeless tobacco. He reports that he does not drink alcohol or use drugs.  Family / Support Systems Marital Status: Married Patient Roles: Spouse, Parent,  Psychologist, occupational, Other (Comment)(church member) Spouse/Significant Other: Arles Rumbold- wife - [phone] Children: [name] - dtr - [phone] Other Supports: Tannen Vandezande - brother - [phone] Anticipated Caregiver: dtr is unsure at this time who will care for pt.  may need to hire paid caregivers vs. SNF Ability/Limitations of Caregiver: Wife is on a walker and husband cared for wife PTA.  son has a mental disability. Maudry Diego has been helping wife and son some while pt is hospitalized, but dtr feels this will be limited and has already been.  Dtr lives and works in [employer]. Careers adviser: Other (Comment) Family Dynamics: family is supportive, but situation is complicated in that dtr lives and works in [place] and pt was caregiver to his wife and son, so now they need assistance and will not be able to assist pt  Social History Preferred language: English Religion: Christian Read: Yes Write: Yes Employment Status: Retired Date Retired/Disabled/Unemployed: 1999-2000 Age Retired: 36 Public relations account executive Issues: none reported Guardian/Conservator: MD has determined that pt cannot make his own decisions.     Abuse/Neglect Abuse/Neglect Assessment Can Be Completed: Unable to assess, patient is non-responsive or altered mental status(Pt did not communicate with CSW at assessment, but it seems there was no abuse/neglect and pt was caregiver for his wife and son.)  Emotional Status Pt's affect, behavior and adjustment status: Pt was very tired during CSW's visit and he did not participate with  conversation between [place] and his dtr. Recent Psychosocial Issues: Pt cares for wife and son.  Per dtr, he does everything at home and does all the out of home errands, as well. Psychiatric History: none reported Substance Abuse History: none reported  Patient / Family Perceptions, Expectations & Goals Pt/Family understanding of illness & functional limitations: Pt's dtr expressed a  good understanding of pt's conditions and likely needs at d/c. Premorbid pt/family roles/activities: Pt was doing everything for the home and outside the home - errands, transporation, etc.  He was also a Psychologist, occupational. Anticipated changes in roles/activities/participation: Dtr knows pt would want to resume the above, but she's not sure how realistic that is and does not know what to do in the meantime. Pt/family expectations/goals: Pt's dtr just wants to get everyone settled and cared for - her mother, brother, and father.  Community Resources [employer] Scripts: None Premorbid Home Care/DME Agencies: None Transportation available at discharge: family Resource referrals recommended: Neuropsychology, Support group (specify)  Discharge Planning Living Arrangements: Spouse/significant other, Children(adult son with mental disability.) Support Systems: Spouse/significant other, Children, Other relatives, Friends/neighbors, Church/faith community Type of Residence: Private residence Insurance Resources: Multimedia programmer (specify)([employer]) Museum/gallery curator Resources: [place] Referred: No Money Management: Patient Does the patient have any problems obtaining your medications?: No Home Management: Pt was doing all of this.  Son would do what he could.  Wife cannot assist. Patient/Family Preliminary Plans: Dtr is trying to figure out what to do with the three family members who need care.  Pt and his wife have long term care policies, but son will still need care. Social Work Anticipated Follow Up Needs: HH/OP, SNF, Support Group Expected length of stay: 24 to 28 days  Clinical Impression CSW met with pt and his dtr to introduce self and role of CSW, as well as to complete assessment.  Pt was fatigued and did not participate much in conversation, but did acknowledge CSW.  Dtr is overwhelmed, understandably so, with how to help not only her father, but her mother and  brother also who pt cared for PTA.  Dtr is asking cousin to assist and may need to hire private duty care.  She is also looking into their long term care policy to see how that may support them.  Pt may need SNF care, but CSW explained that this would need to be approved by pt's managed care Medicare.  She expressed understanding.  CSW expressed support for all dtr is going through and will assist her and pt and family as CSW is able.  CSW will remain available and will continue to follow.  Dashun Borre, [name] 03/02/2018, 1:39 AM

## 2018-03-02 NOTE — Progress Notes (Signed)
Speech Language Pathology Daily Session Note  Patient Details  Name: Kyle Richardson MRN: [id number] Date of Birth: 05-Feb-1928  Today's Date: 03/02/2018 SLP Individual Time: 9629-5284 SLP Individual Time Calculation (min): 44 min  Short Term Goals: Week 1: SLP Short Term Goal 1 (Week 1): Patient will scan to midline in 50% of opportunities with Max A multimodal cues.  SLP Short Term Goal 2 (Week 1): Patient will demonstrate sustained attention to tasks for ~10 minutes with Min A verbal cues for redirection.  SLP Short Term Goal 3 (Week 1): Patient will follow basic commands with 75% accuracy and Mod A verbal cues.  SLP Short Term Goal 4 (Week 1): Patient will name functional items with 75% accuracy and Max A multimodal cues.  SLP Short Term Goal 5 (Week 1): Patient will verbally respond to open-ended questions accurately in 50% of opportuniteis with Max A multimodal cues.   Skilled Therapeutic Interventions:Skilled ST services focused on speech skills. Pt's daughter was present for session and assisted SLP in repositioning in bed, pt required max a verbal cues to follow simple commands during task. SLP facilitated expressive naming of common objects utilzing LARK toolkit, pt demonstrated 30% accuracy requiring max A sentence completion cues and 50% when given a choice of 2. Pt demonstrated ability to respond to open ended questions accurately with 30% accuracy given max A verbal cues. SLP assisted nurse with administration of mouthwash, pt was unable to spit given max a multimodal cues, recomending to utilizing suction toothbrush for administration of mouthwash. Pt was left in room with call bell within reach. Reccomend to continue skilled ST services.      Function:  Eating Eating                 Cognition Comprehension Comprehension assist level: Understands basic 50 - 74% of the time/ requires cueing 25 - 49% of the time;Understands basic 25 - 49% of the time/ requires cueing 50 - 75%  of the time  Expression   Expression assist level: Expresses basic 25 - 49% of the time/requires cueing 50 - 75% of the time. Uses single words/gestures.  Social Interaction Social Interaction assist level: Interacts appropriately 25 - 49% of time - Needs frequent redirection.  Problem Solving Problem solving assist level: Solves basic less than 25% of the time - needs direction nearly all the time or does not effectively solve problems and may need a restraint for safety  Memory Memory assist level: Recognizes or recalls less than 25% of the time/requires cueing greater than 75% of the time    Pain Pain Assessment Pain Scale: Faces Pain Score: 0-No pain Faces Pain Scale: No hurt  Therapy/Group: Individual Therapy  Cyrene Gharibian  [facility] 03/02/2018, 3:22 PM

## 2018-03-02 NOTE — IPOC Note (Signed)
Overall Plan of Care [facility]) Patient Details Name: Kyle Richardson MRN: [id number] DOB: 24-May-1928  Admitting Diagnosis: <principal problem not specified>  Hospital Problems: Active Problems:   Acute ischemic left PCA stroke [facility])     Functional Problem List: Nursing Bowel, Bladder, Medication Management, Skin Integrity, Motor, Nutrition, Endurance, Perception, Safety, Sensory  PT Balance, Behavior, Endurance, Motor, Pain, Perception, Safety  OT Balance, Cognition, Endurance, Motor, Perception, Pain, Safety, Sensory, Vision  SLP Cognition, Linguistic  TR         Basic ADL's: OT Eating, Grooming, Bathing, Dressing, Toileting     Advanced  ADL's: OT       Transfers: PT Bed Mobility, Bed to Chair, Car, Floor, Manufacturing systems engineer, Metallurgist: PT Ambulation, Emergency planning/management officer, Stairs     Additional Impairments: OT Fuctional Use of Upper Extremity  SLP Communication, Social Cognition comprehension, expression Social Interaction, Problem Solving, Memory, Attention, Awareness  TR      Anticipated Outcomes Item Anticipated Outcome  Self Feeding Min A  Swallowing      Basic self-care  Min A  Toileting  Min A   Bathroom Transfers Min A  Bowel/Bladder  Mod assist  Transfers  Min assist  Locomotion  Min assist gait short distance  Communication  Min A   Cognition  Min A   Pain  3 or less  Safety/Judgment  Mod assist   Therapy Plan: PT Intensity: Minimum of 1-2 x/day ,45 to 90 minutes PT Frequency: 5 out of 7 days PT Duration Estimated Length of Stay: 4 weeks OT Intensity: Minimum of 1-2 x/day, 45 to 90 minutes OT Frequency: 5 out of 7 days OT Duration/Estimated Length of Stay: 24-28 days SLP Intensity: Minumum of 1-2 x/day, 30 to 90 minutes SLP Frequency: 3 to 5 out of 7 days SLP Duration/Estimated Length of Stay: 4 weeks     Team Interventions: Nursing Interventions Patient/Family Education, Skin Care/Wound Management, Bladder  Management, Pain Management, Bowel Management, Medication Management, Psychosocial Support, Discharge Planning, Disease Management/Prevention, Cognitive Remediation/Compensation  PT interventions Ambulation/gait training, Disease management/prevention, Pain management, Stair training, Visual/perceptual remediation/compensation, Wheelchair propulsion/positioning, Therapeutic Activities, Patient/family education, DME/adaptive equipment instruction, Training and development officer, Cognitive remediation/compensation, Functional electrical stimulation, Psychosocial support, Therapeutic Exercise, UE/LE Strength taining/ROM, Skin care/wound management, Functional mobility training, Community reintegration, Discharge planning, Neuromuscular re-education, UE/LE Coordination activities, Splinting/orthotics  OT Interventions Training and development officer, Cognitive remediation/compensation, Community reintegration, Discharge planning, DME/adaptive equipment instruction, Functional mobility training, Functional electrical stimulation, Neuromuscular re-education, Pain management, Self Care/advanced ADL retraining, Psychosocial support, Patient/family education, Therapeutic Activities, Therapeutic Exercise, Splinting/orthotics, UE/LE Strength taining/ROM, UE/LE Coordination activities, Wheelchair propulsion/positioning, Visual/perceptual remediation/compensation  SLP Interventions Cognitive remediation/compensation, Environmental controls, Internal/external aids, Speech/Language facilitation, Therapeutic Activities, Patient/family education, Functional tasks, Cueing hierarchy  TR Interventions    SW/CM Interventions Discharge Planning, Psychosocial Support, Patient/Family Education   Barriers to Discharge MD  Medical stability  Nursing      PT Decreased caregiver support, Home environment access/layout, Inaccessible home environment Pt is caregiver for wife, minimal physical assistance required, has disabled son as well   OT      SLP      SW       Team Discharge Planning: Destination: PT-Home(home vs SNF if family is unable to arrange care pt will likely require at discharge) ,OT- Home , SLP-Home Projected Follow-up: PT-Home health PT, OT-  Home health OT, SLP-Home Health SLP, 24 hour supervision/assistance Projected Equipment Needs: PT-To be determined, OT- To be determined, SLP-None recommended by SLP Equipment Details: PT- ,  OT-  Patient/family involved in discharge planning: PT- Patient, Family member/caregiver,  OT-Patient, Family member/caregiver, SLP-Patient, Family member/caregiver  MD ELOS: 19-23d Medical Rehab Prognosis:  Excellent Assessment:  84 year old male with history of CAD, PAF- on eliquis, T2DM, COPD, OSA- wears oral device,  HTN, intermittent diplopia with diagnosis of MG recently and was admitted on 02/22/18 after unresponsive outside by his family. History taken from chart review. Patient reported difficulty seeing on the right, was confused and right sided weakness.  MRI brain reviewed, showing left CVA. Per report, MRI/MRA brain done revealing acute L-PCA territory infarct affecting left medial temporal lobe, left hippocampus and left thalamus, small acute infarct left occipital lobe and no hemorrhage.  2D echo showed EF 60-65% with mild LVH, basal inferior hypokinesis and grade 1 DD. Carotid dopplers showed 50-69% heterogenous and partially calcified plaque at right carotid bifurcation. Dr. Tyron Russell recommended continuing Eliquis as well as aggressive management of risk factors.   He continues to have bouts of lethargy but showing improvement in activity tolerance. He continues to be limited by right sided weakness with significant flexor tone, right neglect,  Wernicke's aphasia with  moderate to severe expressive and receptive deficits and confusion.    Now requiring 24/7 Rehab RN,MD, as well as CIR level PT, OT and SLP.  Treatment team will focus on ADLs and mobility with goals set at  [facility]   See Team Conference Notes for weekly updates to the plan of care

## 2018-03-03 ENCOUNTER — Inpatient Hospital Stay (HOSPITAL_COMMUNITY): Payer: PPO | Admitting: Occupational Therapy

## 2018-03-03 LAB — GLUCOSE, CAPILLARY
GLUCOSE-CAPILLARY: 135 mg/dL — AB (ref 65–99)
GLUCOSE-CAPILLARY: 209 mg/dL — AB (ref 65–99)
Glucose-Capillary: 130 mg/dL — ABNORMAL HIGH (ref 65–99)
Glucose-Capillary: 186 mg/dL — ABNORMAL HIGH (ref 65–99)

## 2018-03-03 NOTE — Progress Notes (Signed)
Occupational Therapy Session Note  Patient Details  Name: Kyle Richardson MRN: [id number] Date of Birth: 01-08-1928  Today's Date: 03/03/2018 OT Individual Time:  -   1000-1025  (25 min)      Short Term Goals: Week 1:  OT Short Term Goal 1 (Week 1): Pt will locate 2/4 grooming items on R side of the sink with mod instructional cues. OT Short Term Goal 2 (Week 1): Pt will complete toilet transfer with max A of 1 caregiver. OT Short Term Goal 3 (Week 1): Pt will wash 4/10 body parts with multimodal cues and set-up A OT Short Term Goal 4 (Week 1): Pt will complete 1/4 steps to don shirt with min instructional cues. Week 2:     Skilled Therapeutic Interventions/Progress Updates:    Pt lying supine with daughter present.  Pt is HOH and has delayed responses to directions and follow through.  Required HOH tactile cues performing rolling to right with mod assist.  Supine to sit with max assist.  Sit to stand to position in stedy with +2 max assist.  Daughter assisted with transfer using stedy.  Pt positioned in wc with all needs in reach.   Therapy Documentation Precautions:  Precautions Precautions: Fall Precaution Comments: R hemiplegia, increased R hamstring tone Restrictions Weight Bearing Restrictions: No General:     Pain: Pain Assessment Pain Scale: Faces Faces Pain Scale: No hurt ADL: ADL ADL Comments: Please see functional navigator         See Function Navigator for Current Functional Status.   Therapy/Group: Individual Therapy  Lisa Roca 03/03/2018, 12:51 PM

## 2018-03-03 NOTE — Plan of Care (Signed)
  Problem: RH BOWEL ELIMINATION Goal: RH STG MANAGE BOWEL WITH ASSISTANCE Description STG Manage Bowel with mod Assistance.  Outcome: Progressing Goal: RH STG MANAGE BOWEL W/MEDICATION W/ASSISTANCE Description STG Manage Bowel with Medication with mod Assistance.  Outcome: Progressing   Problem: RH BLADDER ELIMINATION Goal: RH STG MANAGE BLADDER WITH ASSISTANCE Description STG Manage Bladder With mod Assistance  Outcome: Progressing

## 2018-03-03 NOTE — Progress Notes (Signed)
Subjective/Complaints: Discussed MG symptoms prior to CVA, very short (~52min) episodes of vertical or horz diplopia ROS- unreliable due to aphasia Objective: Vital Signs: Blood pressure (!) 155/71, pulse (!) 58, temperature 97.7 F (36.5 C), temperature source Oral, resp. rate 18, height 5\' 8"  (1.727 m), weight 73.3 kg (161 lb 9.6 oz), SpO2 94 %. Dg Chest 2 View  Result Date: 03/01/2018 CLINICAL DATA:  Hypoxia EXAM: CHEST - 2 VIEW COMPARISON:  10/23/2016 FINDINGS: Cardiac shadow is stable. Postsurgical changes are again seen. The lungs are well aerated bilaterally with mild left basilar atelectasis. No focal infiltrate or effusion is seen. No bony abnormality is noted. IMPRESSION: Mild left basilar atelectasis. Electronically Signed   By: Inez Catalina M.D.   On: 03/01/2018 11:12   Results for orders placed or performed during the hospital encounter of 02/27/18 (from the past 72 hour(s))  Glucose, capillary     Status: Abnormal   Collection Time: 02/28/18 11:35 AM  Result Value Ref Range   Glucose-Capillary 213 (H) 65 - 99 mg/dL  Glucose, capillary     Status: Abnormal   Collection Time: 02/28/18  4:28 PM  Result Value Ref Range   Glucose-Capillary 117 (H) 65 - 99 mg/dL  Glucose, capillary     Status: Abnormal   Collection Time: 02/28/18  8:22 PM  Result Value Ref Range   Glucose-Capillary 121 (H) 65 - 99 mg/dL  Glucose, capillary     Status: Abnormal   Collection Time: 02/28/18  9:41 PM  Result Value Ref Range   Glucose-Capillary 137 (H) 65 - 99 mg/dL  Glucose, capillary     Status: Abnormal   Collection Time: 03/01/18  6:22 AM  Result Value Ref Range   Glucose-Capillary 118 (H) 65 - 99 mg/dL  Glucose, capillary     Status: Abnormal   Collection Time: 03/01/18 11:25 AM  Result Value Ref Range   Glucose-Capillary 162 (H) 65 - 99 mg/dL  Glucose, capillary     Status: Abnormal   Collection Time: 03/01/18  4:49 PM  Result Value Ref Range   Glucose-Capillary 120 (H) 65 - 99 mg/dL   Glucose, capillary     Status: Abnormal   Collection Time: 03/01/18  9:40 PM  Result Value Ref Range   Glucose-Capillary 138 (H) 65 - 99 mg/dL  Glucose, capillary     Status: Abnormal   Collection Time: 03/02/18  6:33 AM  Result Value Ref Range   Glucose-Capillary 120 (H) 65 - 99 mg/dL  Glucose, capillary     Status: Abnormal   Collection Time: 03/02/18 11:52 AM  Result Value Ref Range   Glucose-Capillary 155 (H) 65 - 99 mg/dL  Glucose, capillary     Status: Abnormal   Collection Time: 03/02/18  4:56 PM  Result Value Ref Range   Glucose-Capillary 131 (H) 65 - 99 mg/dL  Glucose, capillary     Status: Abnormal   Collection Time: 03/02/18 10:18 PM  Result Value Ref Range   Glucose-Capillary 156 (H) 65 - 99 mg/dL  Glucose, capillary     Status: Abnormal   Collection Time: 03/03/18  6:41 AM  Result Value Ref Range   Glucose-Capillary 130 (H) 65 - 99 mg/dL     HEENT: normal Cardio: irregular and No murmurs Resp: CTA B/L and Unlabored GI: BS positive and Nontender nondistended Extremity:  No Edema Skin:   Intact Neuro: Confused, Abnormal Sensory Not assessed secondary to aphasia, Abnormal Motor Difficult to assess secondary to apraxia no movement to command on  the right side although tone is normal, Aphasic and Apraxic Musc/Skel:  Other No pain with upper limb or lower limb range of motion General no acute distress   Assessment/Plan: 1. Functional deficits secondary to left PCA infarct with right hemiparesis, receptive aphasia which require 3+ hours per day of interdisciplinary therapy in a comprehensive inpatient rehab setting. Physiatrist is providing close team supervision and 24 hour management of active medical problems listed below. Physiatrist and rehab team continue to assess barriers to discharge/monitor patient progress toward functional and medical goals. FIM: Function - Bathing Position: Wheelchair/chair at sink Body parts bathed by patient: Chest, Abdomen, Front  perineal area, Right arm, Left arm Body parts bathed by helper: Buttocks, Back Assist Level: 2 helpers  Function- Upper Body Dressing/Undressing What is the patient wearing?: Pull over shirt/dress Pull over shirt/dress - Perfomed by patient: Put head through opening, Pull shirt over trunk Pull over shirt/dress - Perfomed by helper: Thread/unthread right sleeve, Thread/unthread left sleeve Assist Level: Touching or steadying assistance(Pt > 75%) Function - Lower Body Dressing/Undressing What is the patient wearing?: Underwear Position: Other (comment)(stedy) Underwear - Performed by helper: Thread/unthread right underwear leg, Thread/unthread left underwear leg, Pull underwear up/down Pants- Performed by helper: Thread/unthread right pants leg, Pull pants up/down, Thread/unthread left pants leg Non-skid slipper socks- Performed by helper: Don/doff left sock, Don/doff right sock Assist for footwear: Dependant Assist for lower body dressing: 2 Helpers  Function - Toileting Toileting activity did not occur: No continent bowel/bladder event  Function - Air cabin crew transfer activity did not occur: Safety/medical concerns  Function - Chair/bed transfer Chair/bed transfer method: Other Chair/bed transfer assist level: 2 helpers Chair/bed transfer assistive device: Mechanical lift Mechanical lift: Stedy Chair/bed transfer details: Manual facilitation for placement, Manual facilitation for weight shifting, Verbal cues for sequencing, Verbal cues for technique  Function - Locomotion: Wheelchair Will patient use wheelchair at discharge?: Yes Type: Manual Wheelchair activity did not occur: Safety/medical concerns Wheel 50 feet with 2 turns activity did not occur: Safety/medical concerns Wheel 150 feet activity did not occur: Safety/medical concerns Turns around,maneuvers to table,bed, and toilet,negotiates 3% grade,maneuvers on rugs and over doorsills: No Function - Locomotion:  Ambulation Ambulation activity did not occur: Safety/medical concerns Walk 10 feet activity did not occur: Safety/medical concerns Walk 50 feet with 2 turns activity did not occur: Safety/medical concerns Walk 150 feet activity did not occur: Safety/medical concerns Walk 10 feet on uneven surfaces activity did not occur: Safety/medical concerns  Function - Comprehension Comprehension: Auditory Comprehension assistive device: Hearing aids Comprehension assist level: Understands basic 50 - 74% of the time/ requires cueing 25 - 49% of the time, Understands basic 25 - 49% of the time/ requires cueing 50 - 75% of the time  Function - Expression Expression: Verbal Expression assist level: Expresses basic 25 - 49% of the time/requires cueing 50 - 75% of the time. Uses single words/gestures.  Function - Social Interaction Social Interaction assist level: Interacts appropriately 25 - 49% of time - Needs frequent redirection.  Function - Problem Solving Problem solving assist level: Solves basic less than 25% of the time - needs direction nearly all the time or does not effectively solve problems and may need a restraint for safety  Function - Memory Memory assist level: Recognizes or recalls less than 25% of the time/requires cueing greater than 75% of the time Patient normally able to recall (first 3 days only): None of the above   Medical Problem List and Plan: 1.  Right hemiparesis  and global aphasia secondary to embolic left PCA infarct. Cont PT OT speech max/total A reviewed notes 2.  DVT Prophylaxis/Anticoagulation: Pharmaceutical: Other (comment)--Eliquis this will serve as DVT prophylaxis as well 3. Pain Management: tylenol prn.  4. Mood: Team to provide ego support to help with frustration.  LCSW to follow for evaluation and support when appropriate.  5. Neuropsych: This patient is not capable of making decisions on his own behalf. 6. Skin/Wound Care: routine pressure relief  measures.  7. Fluids/Electrolytes/Nutrition: Monitor I/O. Add full supervision at meals for safety., CMET normal except for low alb 8. PAF/CAD: Monitor HR bid. ON metoprolol and Lipitor.  Vitals:   03/02/18 2219 03/03/18 0554  BP: (!) 143/66 (!) 155/71  Pulse: 62 (!) 58  Resp: 19 18  Temp:    SpO2: 95% 94%  mild bradycardia, pressures ok for now may tighten control next wk 9. T2DM: Hgb A1C- 7.1.  Monitor BS ac/hs and use SSI for elevated BS. Now on metformin daily. Due to  GI distress will D/C CBG (last 3)  Recent Labs    03/02/18 1656 03/02/18 2218 03/03/18 0641  GLUCAP 131* 156* 130*  Controlled 5/26  10. Myasthenia Gravis with intermittent diplopia: On mestonin 30 mg bid--to be increased to 60 mg bid per discharge summary, daughter doesn't want him to have this, had EMG confirmation. Monitor for fatiguability , ocular symptoms, if this is limiting therapy will revisit this with daughter as pt cannot make a decision, pt also cannot provide subjective symptoms 11. Glaucoma: Continue Ocuvite bid and  Xalatan at bedtime.  12.  Hypoalb- prostat 13.  Reports of hypoxemia responding to O2  CXR,showed bibasilar atelectasis-IS  LOS (Days) 4 A FACE TO FACE EVALUATION WAS PERFORMED  Charlett Blake 03/03/2018, 9:48 AM

## 2018-03-04 ENCOUNTER — Encounter (HOSPITAL_COMMUNITY): Payer: Self-pay | Admitting: *Deleted

## 2018-03-04 ENCOUNTER — Inpatient Hospital Stay (HOSPITAL_COMMUNITY): Payer: PPO

## 2018-03-04 ENCOUNTER — Inpatient Hospital Stay (HOSPITAL_COMMUNITY): Payer: PPO | Admitting: Speech Pathology

## 2018-03-04 ENCOUNTER — Inpatient Hospital Stay (HOSPITAL_COMMUNITY): Payer: PPO | Admitting: Occupational Therapy

## 2018-03-04 LAB — GLUCOSE, CAPILLARY
GLUCOSE-CAPILLARY: 131 mg/dL — AB (ref 65–99)
GLUCOSE-CAPILLARY: 179 mg/dL — AB (ref 65–99)
GLUCOSE-CAPILLARY: 148 mg/dL — AB (ref 65–99)
Glucose-Capillary: 152 mg/dL — ABNORMAL HIGH (ref 65–99)

## 2018-03-04 LAB — BASIC METABOLIC PANEL
ANION GAP: 9 (ref 5–15)
BUN: 22 mg/dL — ABNORMAL HIGH (ref 6–20)
CO2: 26 mmol/L (ref 22–32)
Calcium: 8.8 mg/dL — ABNORMAL LOW (ref 8.9–10.3)
Chloride: 105 mmol/L (ref 101–111)
Creatinine, Ser: 1.03 mg/dL (ref 0.61–1.24)
Glucose, Bld: 126 mg/dL — ABNORMAL HIGH (ref 65–99)
POTASSIUM: 4 mmol/L (ref 3.5–5.1)
Sodium: 140 mmol/L (ref 135–145)

## 2018-03-04 MED ORDER — PREMIER PROTEIN SHAKE
11.0000 [oz_av] | Freq: Three times a day (TID) | ORAL | Status: DC
  Administered 2018-03-04 – 2018-04-01 (×81): 11 [oz_av] via ORAL
  Filled 2018-03-04 (×151): qty 325.31

## 2018-03-04 NOTE — Progress Notes (Signed)
Speech Language Pathology Daily Session Note  Patient Details  Name: Kyle Richardson MRN: [id number] Date of Birth: 12/01/1927  Today's Date: 03/04/2018 SLP Individual Time: 1300-1400 SLP Individual Time Calculation (min): 60 min  Short Term Goals: Week 1: SLP Short Term Goal 1 (Week 1): Patient will scan to midline in 50% of opportunities with Max A multimodal cues.  SLP Short Term Goal 2 (Week 1): Patient will demonstrate sustained attention to tasks for ~10 minutes with Min A verbal cues for redirection.  SLP Short Term Goal 3 (Week 1): Patient will follow basic commands with 75% accuracy and Mod A verbal cues.  SLP Short Term Goal 4 (Week 1): Patient will name functional items with 75% accuracy and Max A multimodal cues.  SLP Short Term Goal 5 (Week 1): Patient will verbally respond to open-ended questions accurately in 50% of opportuniteis with Max A multimodal cues.   Skilled Therapeutic Interventions: Skilled treatment session focused on communication goals. SLP facilitated session by providing Mod A phonemic and sentence completion cues for patient to name functional items with 80% accuracy and Max A verbal cues for patient to identify the function of the item with 50% accuracy. Patient independently initiated a conversation with this SLP and verbalized that he "felt bad" and "depressed." Patient able to participate in functional conversation with SLP for ~20 minutes and conveyed his thoughts with overall Min A verbal cues. SLP provided emotional support. Daughter and CSW made aware of conversation. Patient left upright in wheelchair with all needs within reach, quick release belt in place and family present. Continue with current plan of care.      Function:   Cognition Comprehension Comprehension assist level: Understands basic 50 - 74% of the time/ requires cueing 25 - 49% of the time  Expression   Expression assist level: Expresses basic 50 - 74% of the time/requires cueing 25 - 49%  of the time. Needs to repeat parts of sentences.  Social Interaction Social Interaction assist level: Interacts appropriately 25 - 49% of time - Needs frequent redirection.  Problem Solving Problem solving assist level: Solves basic less than 25% of the time - needs direction nearly all the time or does not effectively solve problems and may need a restraint for safety  Memory Memory assist level: Recognizes or recalls less than 25% of the time/requires cueing greater than 75% of the time    Pain Pain Assessment Faces Pain Scale: Hurts a little bit  Therapy/Group: Individual Therapy  Rosaleen Mazer 03/04/2018, 3:04 PM

## 2018-03-04 NOTE — Progress Notes (Signed)
Physical Therapy Session Note  Patient Details  Name: Kyle Richardson MRN: [id number] Date of Birth: 1928/06/08  Today's Date: 03/04/2018 PT Individual Time: 1100-1158 and 1415-1445 PT Individual Time Calculation (min): 58 min and 30 min   Short Term Goals: Week 1:  PT Short Term Goal 1 (Week 1): Pt will maintain static sitting balance w/ supervision during functional mobility PT Short Term Goal 2 (Week 1): Pt will perform sit<>stand tranfser w/ max assist x1 PT Short Term Goal 3 (Week 1): Pt will initiate gait training  PT Short Term Goal 4 (Week 1): Pt will attend to R side of body/environment w/ max cues, 100% of time  Skilled Therapeutic Interventions/Progress Updates:    Pt seated in TIS w/c upon PT arrival, agreeable to therapy tx and reports pain but unable to rate/state location. Therapist performed R LE hamstring and heel cord stretches for tone management. Pt performed sit<>stand within stedy with max assist, worked on static standing balance and able to maintain 60-90 sec bouts. In standing, therapist blocking R knee to prevent buckling and facilitating weightshifting onto R LE. Pt in standing able to take L UE off support for static standing balance x 30 sec, verbal cues for upright posture and maintaining midline. Pt transported to the gym. Pt worked on sit<>stand at the rail with L UE support on rail, max assist. In standing, pt worked on maintaining midline with mirror for visual feedback, therapist blocking R knee. Pt worked on R attention to reach for bean bags and toss, hand over hand assist and max cues to complete task. Pt transported back to room in w/c, left in care of daughter.   Session 2: Pt seated in w/c upon PT arrival, agreeable to therapy tx and denies pain. Pt transported to the gym. Pt performed squat pivot transfer from w/c>mat to the L with max assist, verbal cues for techniques and manual facilitation for weightshifting. While seated edge of mat pt worked on  attention to R side in order to locate red cup and grab cup from therapist using L hand, pt requiring max verbal and tactile cues to attend to R side. Pt performed squat pivot transfer towards the R with total assist to w/c, max cues for pt to visually locate w/c on his right side prior to transfer. Pt transported back to room and left seated in care of daughter ad family.   Therapy Documentation Precautions:  Precautions Precautions: Fall Precaution Comments: R hemiplegia, increased R hamstring tone Restrictions Weight Bearing Restrictions: No   See Function Navigator for Current Functional Status.   Therapy/Group: Individual Therapy  Netta Corrigan, PT, DPT 03/04/2018, 7:52 AM

## 2018-03-04 NOTE — Progress Notes (Signed)
Subjective/Complaints: Able to state name but no other responses Discussed BMET result with daughter ROS- unreliable due to aphasia Objective: Vital Signs: Blood pressure (!) 143/57, pulse (!) 58, temperature 97.7 F (36.5 C), temperature source Oral, resp. rate 18, height _0  (1.727 m), weight 73.3 kg (161 lb 9.6 oz), SpO2 93 %. No results found. Results for orders placed or performed during the hospital encounter of 02/27/18 (from the past 72 hour(s))  Glucose, capillary     Status: Abnormal   Collection Time: 03/01/18 11:25 AM  Result Value Ref Range   Glucose-Capillary 162 (H) 65 - 99 mg/dL  Glucose, capillary     Status: Abnormal   Collection Time: 03/01/18  4:49 PM  Result Value Ref Range   Glucose-Capillary 120 (H) 65 - 99 mg/dL  Glucose, capillary     Status: Abnormal   Collection Time: 03/01/18  9:40 PM  Result Value Ref Range   Glucose-Capillary 138 (H) 65 - 99 mg/dL  Glucose, capillary     Status: Abnormal   Collection Time: 03/02/18  6:33 AM  Result Value Ref Range   Glucose-Capillary 120 (H) 65 - 99 mg/dL  Glucose, capillary     Status: Abnormal   Collection Time: 03/02/18 11:52 AM  Result Value Ref Range   Glucose-Capillary 155 (H) 65 - 99 mg/dL  Glucose, capillary     Status: Abnormal   Collection Time: 03/02/18  4:56 PM  Result Value Ref Range   Glucose-Capillary 131 (H) 65 - 99 mg/dL  Glucose, capillary     Status: Abnormal   Collection Time: 03/02/18 10:18 PM  Result Value Ref Range   Glucose-Capillary 156 (H) 65 - 99 mg/dL  Glucose, capillary     Status: Abnormal   Collection Time: 03/03/18  6:41 AM  Result Value Ref Range   Glucose-Capillary 130 (H) 65 - 99 mg/dL  Glucose, capillary     Status: Abnormal   Collection Time: 03/03/18 11:46 AM  Result Value Ref Range   Glucose-Capillary 186 (H) 65 - 99 mg/dL  Glucose, capillary     Status: Abnormal   Collection Time: 03/03/18  4:50 PM  Result Value Ref Range   Glucose-Capillary 135 (H) 65 - 99  mg/dL  Glucose, capillary     Status: Abnormal   Collection Time: 03/03/18  9:10 PM  Result Value Ref Range   Glucose-Capillary 209 (H) 65 - 99 mg/dL  Basic metabolic panel     Status: Abnormal   Collection Time: 03/04/18  5:01 AM  Result Value Ref Range   Sodium 140 135 - 145 mmol/L   Potassium 4.0 3.5 - 5.1 mmol/L   Chloride 105 101 - 111 mmol/L   CO2 26 22 - 32 mmol/L   Glucose, Bld 126 (H) 65 - 99 mg/dL   BUN 22 (H) 6 - 20 mg/dL   Creatinine, Ser 1.03 0.61 - 1.24 mg/dL   Calcium 8.8 (L) 8.9 - 10.3 mg/dL   GFR calc non Af Amer >60 >60 mL/min   GFR calc Af Amer >60 >60 mL/min    Comment: (NOTE) The eGFR has been calculated using the CKD EPI equation. This calculation has not been validated in all clinical situations. eGFR's persistently <60 mL/min signify possible Chronic Kidney Disease.    Anion gap 9 5 - 15    Comment: Performed at [facility] [address], [place], [place] [address]  Glucose, capillary     Status: Abnormal   Collection Time: 03/04/18  6:52  AM  Result Value Ref Range   Glucose-Capillary 131 (H) 65 - 99 mg/dL     HEENT: normal Cardio: irregular and No murmurs Resp: CTA B/L and Unlabored GI: BS positive and Nontender nondistended Extremity:  No Edema Skin:   Intact Neuro: Confused, Abnormal Sensory Not assessed secondary to aphasia, Abnormal Motor Difficult to assess secondary to apraxia no movement to command on the right side although tone is normal, Aphasic and Apraxic Musc/Skel:  Other No pain with upper limb or lower limb range of motion General no acute distress   Assessment/Plan: 1. Functional deficits secondary to left PCA infarct with right hemiparesis, receptive aphasia which require 3+ hours per day of interdisciplinary therapy in a comprehensive inpatient rehab setting. Physiatrist is providing close team supervision and 24 hour management of active medical problems listed below. Physiatrist and rehab team continue to assess  barriers to discharge/monitor patient progress toward functional and medical goals. FIM: Function - Bathing Position: Wheelchair/chair at sink Body parts bathed by patient: Chest, Abdomen, Front perineal area, Right arm, Left arm Body parts bathed by helper: Buttocks, Back Assist Level: 2 helpers  Function- Upper Body Dressing/Undressing What is the patient wearing?: Pull over shirt/dress Pull over shirt/dress - Perfomed by patient: Put head through opening, Pull shirt over trunk Pull over shirt/dress - Perfomed by helper: Thread/unthread right sleeve, Thread/unthread left sleeve, Put head through opening, Pull shirt over trunk Assist Level: Touching or steadying assistance(Pt > 75%) Function - Lower Body Dressing/Undressing What is the patient wearing?: Pants Position: Bed Underwear - Performed by helper: Thread/unthread right underwear leg, Thread/unthread left underwear leg, Pull underwear up/down Pants- Performed by helper: Thread/unthread right pants leg, Pull pants up/down, Thread/unthread left pants leg Non-skid slipper socks- Performed by helper: Don/doff left sock, Don/doff right sock Assist for footwear: Dependant Assist for lower body dressing: 2 Helpers  Function - Toileting Toileting activity did not occur: No continent bowel/bladder event  Function - Air cabin crew transfer activity did not occur: Safety/medical concerns  Function - Chair/bed transfer Chair/bed transfer method: Other Chair/bed transfer assist level: 2 helpers Chair/bed transfer assistive device: Mechanical lift Mechanical lift: Stedy Chair/bed transfer details: Manual facilitation for placement, Manual facilitation for weight shifting, Verbal cues for sequencing, Verbal cues for technique  Function - Locomotion: Wheelchair Will patient use wheelchair at discharge?: Yes Type: Manual Wheelchair activity did not occur: Safety/medical concerns Wheel 50 feet with 2 turns activity did not  occur: Safety/medical concerns Wheel 150 feet activity did not occur: Safety/medical concerns Turns around,maneuvers to table,bed, and toilet,negotiates 3% grade,maneuvers on rugs and over doorsills: No Function - Locomotion: Ambulation Ambulation activity did not occur: Safety/medical concerns Walk 10 feet activity did not occur: Safety/medical concerns Walk 50 feet with 2 turns activity did not occur: Safety/medical concerns Walk 150 feet activity did not occur: Safety/medical concerns Walk 10 feet on uneven surfaces activity did not occur: Safety/medical concerns  Function - Comprehension Comprehension: Auditory Comprehension assistive device: Hearing aids Comprehension assist level: Understands basic 50 - 74% of the time/ requires cueing 25 - 49% of the time, Understands basic 25 - 49% of the time/ requires cueing 50 - 75% of the time  Function - Expression Expression: Verbal Expression assist level: Expresses basic 25 - 49% of the time/requires cueing 50 - 75% of the time. Uses single words/gestures.  Function - Social Interaction Social Interaction assist level: Interacts appropriately 25 - 49% of time - Needs frequent redirection.  Function - Problem Solving Problem solving assist level: Solves  basic less than 25% of the time - needs direction nearly all the time or does not effectively solve problems and may need a restraint for safety  Function - Memory Memory assist level: Recognizes or recalls less than 25% of the time/requires cueing greater than 75% of the time Patient normally able to recall (first 3 days only): None of the above   Medical Problem List and Plan: 1.  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 Cont PT OT speech  reviewed notes 2.  DVT Prophylaxis/Anticoagulation: Pharmaceutical: Other (comment)--Eliquis this will serve as DVT prophylaxis as well 3. Pain Management: tylenol prn.  4. Mood: Team to provide ego support to  help with frustration.  LCSW to follow for evaluation and support when appropriate.  5. Neuropsych: This patient is not capable of making decisions on his own behalf. 6. Skin/Wound Care: routine pressure relief measures.  7. Fluids/Electrolytes/Nutrition: Monitor I/O. Add full supervision at meals for safety., BMET normal 5/27 8. PAF/CAD: Monitor HR bid. ON metoprolol and Lipitor.  Vitals:   03/03/18 1953 03/04/18 0449  BP: 126/66 (!) 143/57  Pulse: 64 (!) 58  Resp: 18 18  Temp: (!) 97.3 F (36.3 C) 97.7 F (36.5 C)  SpO2: 94% 93%  mild bradycardia, pressures ok for now may tighten control next wk 9. T2DM: Hgb A1C- 7.1.  Monitor BS ac/hs and use SSI for elevated BS. Now on metformin daily. Due to  GI distress will D/C CBG (last 3)  Recent Labs    03/03/18 1650 03/03/18 2110 03/04/18 0652  GLUCAP 135* 209* 131*  Controlled 5/27  10. Myasthenia Gravis with intermittent diplopia: On mestonin 30 mg bid--to be increased to 60 mg bid per discharge summary, daughter doesn't want him to have this, had EMG confirmation. Monitor for fatiguability , ocular symptoms, if this is limiting therapy will revisit this with daughter as pt cannot make a decision, pt also cannot provide subjective symptoms 11. Glaucoma: Continue Ocuvite bid and  Xalatan at bedtime.  12.  Hypoalb- prostat 13.  Reports of hypoxemia responding to O2  CXR,showed bibasilar atelectasis-IS  LOS (Days) 5 A FACE TO FACE EVALUATION WAS PERFORMED  Charlett Blake 03/04/2018, 8:31 AM

## 2018-03-04 NOTE — Plan of Care (Signed)
  Problem: Consults Goal: RH STROKE PATIENT EDUCATION Description See Patient Education module for education specifics  Outcome: Progressing   Problem: RH BOWEL ELIMINATION Goal: RH STG MANAGE BOWEL WITH ASSISTANCE Description STG Manage Bowel with mod Assistance.  Outcome: Progressing Flowsheets (Taken 03/04/2018 1302) STG: Pt will manage bowels with assistance: 2-Maximum assistance Goal: RH STG MANAGE BOWEL W/MEDICATION W/ASSISTANCE Description STG Manage Bowel with Medication with mod Assistance.  Outcome: Progressing Flowsheets (Taken 03/04/2018 1302) STG: Pt will manage bowels with medication with assistance: 2-Maximum assistance   Problem: RH BLADDER ELIMINATION Goal: RH STG MANAGE BLADDER WITH ASSISTANCE Description STG Manage Bladder With mod Assistance  Outcome: Progressing Flowsheets (Taken 03/04/2018 1302) STG: Pt will manage bladder with assistance: 1-Total assistance   Problem: RH SKIN INTEGRITY Goal: RH STG SKIN FREE OF INFECTION/BREAKDOWN Description Pt will remain free from skin infection/ breakdown with mod assistance.  Outcome: Progressing Goal: RH STG MAINTAIN SKIN INTEGRITY WITH ASSISTANCE Description STG Maintain Skin Integrity With mod Assistance.  Outcome: Progressing Flowsheets (Taken 03/04/2018 1302) STG: Maintain skin integrity with assistance: 4-Minimal assistance   Problem: RH SAFETY Goal: RH STG ADHERE TO SAFETY PRECAUTIONS W/ASSISTANCE/DEVICE Description STG Adhere to Safety Precautions With mod  Assistance/Device.  Outcome: Progressing Flowsheets (Taken 03/04/2018 1302) STG:Pt will adhere to safety precautions with assistance/device: 3-Moderate assistance   Problem: RH COGNITION-NURSING Goal: RH STG USES MEMORY AIDS/STRATEGIES W/ASSIST TO PROBLEM SOLVE Description STG Uses Memory Aids/Strategies With mod Assistance to Problem Solve.  Outcome: Progressing   Problem: RH KNOWLEDGE DEFICIT Goal: RH STG INCREASE KNOWLEDGE OF  DIABETES Outcome: Progressing Goal: RH STG INCREASE KNOWLEDGE OF HYPERTENSION Outcome: Progressing

## 2018-03-04 NOTE — Progress Notes (Signed)
Occupational Therapy Session Note  Patient Details  Name: Kyle Richardson MRN: [id number] Date of Birth: [birth year]  Today's Date: 03/04/2018 OT Individual Time: 2482-5003 OT Individual Time Calculation (min): 57 min   Short Term Goals: Week 1:  OT Short Term Goal 1 (Week 1): Pt will locate 2/4 grooming items on R side of the sink with mod instructional cues. OT Short Term Goal 2 (Week 1): Pt will complete toilet transfer with max A of 1 caregiver. OT Short Term Goal 3 (Week 1): Pt will wash 4/10 body parts with multimodal cues and set-up A OT Short Term Goal 4 (Week 1): Pt will complete 1/4 steps to don shirt with min instructional cues.  Skilled Therapeutic Interventions/Progress Updates:    Pt greeted semi-reclined in bed with daughter present. Pt incontinent of urina and soaked through to bed sheets, pt unaware. Worked on rolling L and R with focus on head turn and gaze prior to rolling R. Min A to roll to the R and Mod/max A to roll L 2/2 R UE apraxia and difficulty reaching across body. Pt able to follow commands with verbal and tactile cues to wash front peri-area with HOB elevated. Total A to wash buttocks and change brief. Pants donned in bed with pt able to follow 1-step commands with max instructional cues to lift LE's into pants. OT stabilized B LEs, then cued pt to bridge and he was able to lift his bottom completely off the bed to allow +2 to advance pants over hips. Pt came to sitting EOB with max instructional cues and max A of 1. Pt able to achieve sitting balance with min/supervision. Bariatric Stedy used for sit<>stand with max A+2-placed wedge between LE's and knee to maintain R LE positioning. Pt bathed upper body sitting in Stedy at the sink with overall min/supervision for sitting balance. Worked on R attention, R visual scanning, and functional use of R UE while bathing in [place]. On 1 occasion, pt initiated looking at R arm to doff old shirt. Pt transferred to TIS wc and left  tilted with safety belt on and nurse tech present.   Therapy Documentation Precautions:  Precautions Precautions: Fall Precaution Comments: R hemiplegia, increased R hamstring tone Restrictions Weight Bearing Restrictions: No Pain:  none/denies pain ADL: ADL ADL Comments: Please see functional navigator   See Function Navigator for Current Functional Status.   Therapy/Group: Individual Therapy  Valma Cava 03/04/2018, 9:58 AM

## 2018-03-05 ENCOUNTER — Inpatient Hospital Stay (HOSPITAL_COMMUNITY): Payer: PPO | Admitting: Occupational Therapy

## 2018-03-05 ENCOUNTER — Inpatient Hospital Stay (HOSPITAL_COMMUNITY): Payer: PPO

## 2018-03-05 ENCOUNTER — Inpatient Hospital Stay (HOSPITAL_COMMUNITY): Payer: PPO | Admitting: Physical Therapy

## 2018-03-05 ENCOUNTER — Inpatient Hospital Stay (HOSPITAL_COMMUNITY): Payer: PPO | Admitting: Speech Pathology

## 2018-03-05 LAB — GLUCOSE, CAPILLARY
GLUCOSE-CAPILLARY: 175 mg/dL — AB (ref 65–99)
Glucose-Capillary: 133 mg/dL — ABNORMAL HIGH (ref 65–99)
Glucose-Capillary: 135 mg/dL — ABNORMAL HIGH (ref 65–99)
Glucose-Capillary: 135 mg/dL — ABNORMAL HIGH (ref 65–99)

## 2018-03-05 MED ORDER — AMLODIPINE BESYLATE 2.5 MG PO TABS
2.5000 mg | ORAL_TABLET | Freq: Every day | ORAL | Status: DC
  Administered 2018-03-05 – 2018-03-11 (×7): 2.5 mg via ORAL
  Filled 2018-03-05 (×7): qty 1

## 2018-03-05 NOTE — Progress Notes (Signed)
Speech Language Pathology Daily Session Note  Patient Details  Name: Kyle Richardson MRN: [id number] Date of Birth: 09/05/1928  Today's Date: 03/05/2018 SLP Individual Time: 0815-0900 SLP Individual Time Calculation (min): 45 min  Short Term Goals: Week 1: SLP Short Term Goal 1 (Week 1): Patient will scan to midline in 50% of opportunities with Max A multimodal cues.  SLP Short Term Goal 2 (Week 1): Patient will demonstrate sustained attention to tasks for ~10 minutes with Min A verbal cues for redirection.  SLP Short Term Goal 3 (Week 1): Patient will follow basic commands with 75% accuracy and Mod A verbal cues.  SLP Short Term Goal 4 (Week 1): Patient will name functional items with 75% accuracy and Max A multimodal cues.  SLP Short Term Goal 5 (Week 1): Patient will verbally respond to open-ended questions accurately in 50% of opportuniteis with Max A multimodal cues.   Skilled Therapeutic Interventions: Skilled treatment session focused on cognitive goals. Upon arrival, NT was feeding the patient his breakfast meal. SLP facilitated session by providing overall Min A verbal and tactile cues for problem solving during self-feeding with the patient's LUE. Patient demonstrated sustained attention to self-feeding for ~30 minutes with Min A verbal cues for redirection. SLP also facilitated session by providing overall Max A multimodal cues for patient to scan past midline to his right field of environment throughout functional tasks/conversation. Patient left upright in bed with RN present. Continue with current plan of care.      Function:  Eating Eating   Modified Consistency Diet: No Eating Assist Level: Supervision or verbal cues;More than reasonable amount of time   Eating Set Up Assist For: Opening containers;Cutting food       Cognition Comprehension Comprehension assist level: Understands basic 50 - 74% of the time/ requires cueing 25 - 49% of the time  Expression   Expression  assist level: Expresses basic 50 - 74% of the time/requires cueing 25 - 49% of the time. Needs to repeat parts of sentences.  Social Interaction Social Interaction assist level: Interacts appropriately 25 - 49% of time - Needs frequent redirection.  Problem Solving Problem solving assist level: Solves basic less than 25% of the time - needs direction nearly all the time or does not effectively solve problems and may need a restraint for safety  Memory Memory assist level: Recognizes or recalls less than 25% of the time/requires cueing greater than 75% of the time    Pain No/Denies Pain   Therapy/Group: Individual Therapy  Bary Limbach 03/05/2018, 3:49 PM

## 2018-03-05 NOTE — Progress Notes (Signed)
Physical Therapy Session Note  Patient Details  Name: Kyle Richardson MRN: [id number] Date of Birth: 11/06/1927  Today's Date: 03/05/2018 PT Individual Time: 1100-1200 PT Individual Time Calculation (min): 60 min   Short Term Goals: Week 1:  PT Short Term Goal 1 (Week 1): Pt will maintain static sitting balance w/ supervision during functional mobility PT Short Term Goal 2 (Week 1): Pt will perform sit<>stand tranfser w/ max assist x1 PT Short Term Goal 3 (Week 1): Pt will initiate gait training  PT Short Term Goal 4 (Week 1): Pt will attend to R side of body/environment w/ max cues, 100% of time  Skilled Therapeutic Interventions/Progress Updates:   Pt in TIS and agreeable to therapy, no c/o pain. Pt much more conversational this session compared to previous sessions with this therapist. Total assist w/c transport to/from gym. Session focused on sit<>stands at rail in hallway, pre-gait tasks, and RLE NMR in standing. Performed 10+ sit<>stands total w/ mod-max assist, verbal cues for technique, tactile cues for initiating, and mod-max assist to block R knee while facilitating R quad activation. Performed lateral weight shifting in stance w/ max fading to min assist manual cues to block R knee. Increased R quad activation as the session went on. Additionally performed pre-gait tasks including R and L stepping forward and back w/ manual assist to facilitate R quad activation. Able to advance LLE w/o assist, and again, max fading to min assist to block R knee in unilateral stance. Max-total assist to advance RLE forward and back w/ 1 instance where pt needed no manual assist. Pt following verbal cues ~90% of time, much improved from previous sessions. Performed R attention tasks during seated rest breaks requiring pt to cross midline w/ LUE to grab and pass ball, ~50% success. Additionally performed RLE ball kicks in seated to work on attention to R side of body, ~90% success. Returned to room and  educated son-in-law on techniques family members can perform outside of therapy to promote R attention to environment and body awareness. Ended session in TIS, in care of son-in-law and all needs met.    Therapy Documentation Precautions:  Precautions Precautions: Fall Precaution Comments: R hemiplegia, increased R hamstring tone Restrictions Weight Bearing Restrictions: No  See Function Navigator for Current Functional Status.   Therapy/Group: Individual Therapy   K Arnette 03/05/2018, 12:12 PM  

## 2018-03-05 NOTE — Progress Notes (Signed)
Subjective/Complaints: Slept from 3a to 7a, able to name fork, feeds self with L hand ROS- unreliable due to aphasia Objective: Vital Signs: Blood pressure (!) 151/83, pulse (!) 57, temperature 98.6 F (37 C), temperature source Oral, resp. rate 18, height '5\' 8"'  (1.727 m), weight 73.3 kg (161 lb 9.6 oz), SpO2 96 %. No results found. Results for orders placed or performed during the hospital encounter of 02/27/18 (from the past 72 hour(s))  Glucose, capillary     Status: Abnormal   Collection Time: 03/02/18 11:52 AM  Result Value Ref Range   Glucose-Capillary 155 (H) 65 - 99 mg/dL  Glucose, capillary     Status: Abnormal   Collection Time: 03/02/18  4:56 PM  Result Value Ref Range   Glucose-Capillary 131 (H) 65 - 99 mg/dL  Glucose, capillary     Status: Abnormal   Collection Time: 03/02/18 10:18 PM  Result Value Ref Range   Glucose-Capillary 156 (H) 65 - 99 mg/dL  Glucose, capillary     Status: Abnormal   Collection Time: 03/03/18  6:41 AM  Result Value Ref Range   Glucose-Capillary 130 (H) 65 - 99 mg/dL  Glucose, capillary     Status: Abnormal   Collection Time: 03/03/18 11:46 AM  Result Value Ref Range   Glucose-Capillary 186 (H) 65 - 99 mg/dL  Glucose, capillary     Status: Abnormal   Collection Time: 03/03/18  4:50 PM  Result Value Ref Range   Glucose-Capillary 135 (H) 65 - 99 mg/dL  Glucose, capillary     Status: Abnormal   Collection Time: 03/03/18  9:10 PM  Result Value Ref Range   Glucose-Capillary 209 (H) 65 - 99 mg/dL  Basic metabolic panel     Status: Abnormal   Collection Time: 03/04/18  5:01 AM  Result Value Ref Range   Sodium 140 135 - 145 mmol/L   Potassium 4.0 3.5 - 5.1 mmol/L   Chloride 105 101 - 111 mmol/L   CO2 26 22 - 32 mmol/L   Glucose, Bld 126 (H) 65 - 99 mg/dL   BUN 22 (H) 6 - 20 mg/dL   Creatinine, Ser 1.03 0.61 - 1.24 mg/dL   Calcium 8.8 (L) 8.9 - 10.3 mg/dL   GFR calc non Af Amer >60 >60 mL/min   GFR calc Af Amer >60 >60 mL/min    Comment:  (NOTE) The eGFR has been calculated using the CKD EPI equation. This calculation has not been validated in all clinical situations. eGFR's persistently <60 mL/min signify possible Chronic Kidney Disease.    Anion gap 9 5 - 15    Comment: Performed at [facility] [address] [place], [place] [address]  Glucose, capillary     Status: Abnormal   Collection Time: 03/04/18  6:52 AM  Result Value Ref Range   Glucose-Capillary 131 (H) 65 - 99 mg/dL  Glucose, capillary     Status: Abnormal   Collection Time: 03/04/18 12:00 PM  Result Value Ref Range   Glucose-Capillary 179 (H) 65 - 99 mg/dL  Glucose, capillary     Status: Abnormal   Collection Time: 03/04/18  4:36 PM  Result Value Ref Range   Glucose-Capillary 148 (H) 65 - 99 mg/dL  Glucose, capillary     Status: Abnormal   Collection Time: 03/04/18  9:27 PM  Result Value Ref Range   Glucose-Capillary 152 (H) 65 - 99 mg/dL  Glucose, capillary     Status: Abnormal   Collection Time: 03/05/18  6:38 AM  Result Value Ref Range   Glucose-Capillary 133 (H) 65 - 99 mg/dL     HEENT: normal Cardio: irregular and No murmurs Resp: CTA B/L and Unlabored GI: BS positive and Nontender nondistended Extremity:  No Edema Skin:   Intact Neuro: Confused, Abnormal Sensory Not assessed secondary to aphasia, Abnormal Motor Difficult to assess secondary to apraxia no movement to command on the right side although tone is normal, Aphasic and Apraxic Musc/Skel:  Other No pain with upper limb or lower limb range of motion General no acute distress   Assessment/Plan: 1. Functional deficits secondary to left PCA infarct with right hemiparesis, receptive aphasia which require 3+ hours per day of interdisciplinary therapy in a comprehensive inpatient rehab setting. Physiatrist is providing close team supervision and 24 hour management of active medical problems listed below. Physiatrist and rehab team continue to assess barriers to  discharge/monitor patient progress toward functional and medical goals. FIM: Function - Bathing Position: Wheelchair/chair at sink Body parts bathed by patient: Chest, Abdomen, Front perineal area, Right arm, Left arm Body parts bathed by helper: Buttocks, Back Assist Level: 2 helpers  Function- Upper Body Dressing/Undressing What is the patient wearing?: Pull over shirt/dress Pull over shirt/dress - Perfomed by patient: Put head through opening, Pull shirt over trunk Pull over shirt/dress - Perfomed by helper: Thread/unthread right sleeve, Thread/unthread left sleeve, Put head through opening, Pull shirt over trunk Assist Level: Touching or steadying assistance(Pt > 75%) Function - Lower Body Dressing/Undressing What is the patient wearing?: Pants Position: Bed Underwear - Performed by helper: Thread/unthread right underwear leg, Thread/unthread left underwear leg, Pull underwear up/down Pants- Performed by helper: Thread/unthread right pants leg, Pull pants up/down, Thread/unthread left pants leg Non-skid slipper socks- Performed by helper: Don/doff left sock, Don/doff right sock Assist for footwear: Dependant Assist for lower body dressing: 2 Helpers  Function - Toileting Toileting activity did not occur: No continent bowel/bladder event  Function - Air cabin crew transfer activity did not occur: Safety/medical concerns  Function - Chair/bed transfer Chair/bed transfer method: Squat pivot Chair/bed transfer assist level: Maximal assist (Pt 25 - 49%/lift and lower) Chair/bed transfer assistive device: Mechanical lift Mechanical lift: Stedy Chair/bed transfer details: Manual facilitation for placement, Manual facilitation for weight shifting, Verbal cues for sequencing, Verbal cues for technique  Function - Locomotion: Wheelchair Will patient use wheelchair at discharge?: Yes Type: Manual Wheelchair activity did not occur: Safety/medical concerns Wheel 50 feet with 2  turns activity did not occur: Safety/medical concerns Wheel 150 feet activity did not occur: Safety/medical concerns Turns around,maneuvers to table,bed, and toilet,negotiates 3% grade,maneuvers on rugs and over doorsills: No Function - Locomotion: Ambulation Ambulation activity did not occur: Safety/medical concerns Walk 10 feet activity did not occur: Safety/medical concerns Walk 50 feet with 2 turns activity did not occur: Safety/medical concerns Walk 150 feet activity did not occur: Safety/medical concerns Walk 10 feet on uneven surfaces activity did not occur: Safety/medical concerns  Function - Comprehension Comprehension: Auditory Comprehension assistive device: Hearing aids Comprehension assist level: Understands basic 50 - 74% of the time/ requires cueing 25 - 49% of the time  Function - Expression Expression: Verbal Expression assist level: Expresses basic 50 - 74% of the time/requires cueing 25 - 49% of the time. Needs to repeat parts of sentences.  Function - Social Interaction Social Interaction assist level: Interacts appropriately 25 - 49% of time - Needs frequent redirection.  Function - Problem Solving Problem solving assist level: Solves basic less than 25% of  the time - needs direction nearly all the time or does not effectively solve problems and may need a restraint for safety  Function - Memory Memory assist level: Recognizes or recalls less than 25% of the time/requires cueing greater than 75% of the time Patient normally able to recall (first 3 days only): None of the above   Medical Problem List and Plan: 1.  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 Cont PT OT speech  reviewed notes 2.  DVT Prophylaxis/Anticoagulation: Pharmaceutical: Other (comment)--Eliquis this will serve as DVT prophylaxis as well 3. Pain Management: tylenol prn.  4. Mood: Team to provide ego support to help with frustration.  LCSW to follow for  evaluation and support when appropriate.  On Prozac 5. Neuropsych: This patient is not capable of making decisions on his own behalf. 6. Skin/Wound Care: routine pressure relief measures.  7. Fluids/Electrolytes/Nutrition: Monitor I/O. Add full supervision at meals for safety., BMET normal 5/27 8. PAF/CAD: Monitor HR bid. ON metoprolol and Lipitor.  Vitals:   03/05/18 0522 03/05/18 0759  BP: (!) 169/91 (!) 151/83  Pulse: 63 (!) 57  Resp: 18   Temp:    SpO2: 96%   BP still elevated add low dose amlodipine  9. T2DM: Hgb A1C- 7.1.  Monitor BS ac/hs and use SSI for elevated BS. Now on metformin daily. Due to  GI distress will D/C CBG (last 3)  Recent Labs    03/04/18 1636 03/04/18 2127 03/05/18 0638  GLUCAP 148* 152* 133*  Controlled 5/28  10. Myasthenia Gravis with intermittent diplopia: On mestonin 30 mg bid--to be increased to 60 mg bid per discharge summary, daughter doesn't want him to have this, had EMG confirmation. Monitor for fatiguability , ocular symptoms, if this is limiting therapy will revisit this with daughter as pt cannot make a decision, pt also cannot provide subjective symptoms 11. Glaucoma: Continue Ocuvite bid and  Xalatan at bedtime.  12.  Hypoalb- prostat 13.  Reports of hypoxemia responding to O2  CXR,showed bibasilar atelectasis-IS  LOS (Days) 6 A FACE TO FACE EVALUATION WAS PERFORMED  Charlett Blake 03/05/2018, 8:43 AM

## 2018-03-05 NOTE — Progress Notes (Signed)
Occupational Therapy Session Note  Patient Details  Name: Kyle Richardson MRN: [id number] Date of Birth: 15-Jan-1928  Today's Date: 03/05/2018 OT Individual Time: 1410-1525 OT Individual Time Calculation (min): 75 min   Short Term Goals: Week 1:  OT Short Term Goal 1 (Week 1): Pt will locate 2/4 grooming items on R side of the sink with mod instructional cues. OT Short Term Goal 2 (Week 1): Pt will complete toilet transfer with max A of 1 caregiver. OT Short Term Goal 3 (Week 1): Pt will wash 4/10 body parts with multimodal cues and set-up A OT Short Term Goal 4 (Week 1): Pt will complete 1/4 steps to don shirt with min instructional cues.  Skilled Therapeutic Interventions/Progress Updates:    Pt greeted sitting in wc and agreeable to OT treatment session. Sit<>stand in standing frame using sling. Pt tolerated standing for 7 minute intervals x2. Worked on visual scanning to locate red and blue chips placed on table while standing. Max multimodal cues to look past midline. Pt able to name color of chip on 2/5 opportunities. Pt noted to be incontinent of urine, so pt brought back to the room for brief change. Sit<>stand at the sink with OT providing R knee support with pt able to stand with mod/max A. Second person assisted with clothing management. Pt able to remove L UE from sink to help wash peri-area, but needed max A to maintain standing balance. Changed soiled brief and incorporated forced use of R UE to help pull up pants, max A for balance. Dyn[employer]ision used for R visual scanning and R attention activity. Pt needed physical assist to to facilitate head turn to the R to locate lights. With OT facilitating head turn to the R, pt able to locate 6 lights in a row without cues. Improved initiation overall today. Pt returned to bed with max A stedy transfer. Pt left in care of nursing staff.   Therapy Documentation Precautions:  Precautions Precautions: Fall Precaution Comments: R hemiplegia,  increased R hamstring tone Restrictions Weight Bearing Restrictions: No Pain:   none/denies pain ADL: ADL ADL Comments: Please see functional navigator   See Function Navigator for Current Functional Status.   Therapy/Group: Individual Therapy  Valma Cava 03/05/2018, 3:07 PM

## 2018-03-05 NOTE — Progress Notes (Signed)
Physical Therapy Session Note  Patient Details  Name: Kyle Richardson MRN: [id number] Date of Birth: [birth year]  Today's Date: 03/05/2018 PT Individual Time: 0900-0930 PT Individual Time Calculation (min): 30 min   Short Term Goals: Week 1:  PT Short Term Goal 1 (Week 1): Pt will maintain static sitting balance w/ supervision during functional mobility PT Short Term Goal 2 (Week 1): Pt will perform sit<>stand tranfser w/ max assist x1 PT Short Term Goal 3 (Week 1): Pt will initiate gait training  PT Short Term Goal 4 (Week 1): Pt will attend to R side of body/environment w/ max cues, 100% of time  Skilled Therapeutic Interventions/Progress Updates:    Pt supine in bed upon PT arrival, agreeable to therapy tx and denies pain. Pt performed rolling in each direction with mod assist using bedrails in order to don pants, total assist. Pt transferred from supine>sitting EOB with max assist, verbal cues for techniques. Pt worked on dynamic seated balance and attention to R side in order to visually locate objects on R side and then reaching for object on R side, mod verbal cues. Pt performed squat pivot transfer from bed>TIS w/c with max assist towards the L, verbal cues for techniques and manual facilitation for weightshifting. Pt performed x 2 sit<>stands from w/c within the stedy, max assist. In standing pt worked on static standing balance without UE support while maintaining midline and attending to R side, x 2 trials, therapist blocking R leg. Pt left seated in TIS with QRB in place and needs in reach.   Therapy Documentation Precautions:  Precautions Precautions: Fall Precaution Comments: R hemiplegia, increased R hamstring tone Restrictions Weight Bearing Restrictions: No   See Function Navigator for Current Functional Status.   Therapy/Group: Individual Therapy  Netta Corrigan, PT, DPT 03/05/2018, 7:54 AM

## 2018-03-06 ENCOUNTER — Inpatient Hospital Stay (HOSPITAL_COMMUNITY): Payer: PPO

## 2018-03-06 ENCOUNTER — Inpatient Hospital Stay (HOSPITAL_COMMUNITY): Payer: PPO | Admitting: Speech Pathology

## 2018-03-06 ENCOUNTER — Inpatient Hospital Stay (HOSPITAL_COMMUNITY): Payer: PPO | Admitting: Occupational Therapy

## 2018-03-06 ENCOUNTER — Inpatient Hospital Stay (HOSPITAL_COMMUNITY): Payer: PPO | Admitting: *Deleted

## 2018-03-06 LAB — GLUCOSE, CAPILLARY
GLUCOSE-CAPILLARY: 157 mg/dL — AB (ref 65–99)
GLUCOSE-CAPILLARY: 126 mg/dL — AB (ref 65–99)
Glucose-Capillary: 146 mg/dL — ABNORMAL HIGH (ref 65–99)
Glucose-Capillary: 132 mg/dL — ABNORMAL HIGH (ref 65–99)

## 2018-03-06 MED ORDER — BACLOFEN 5 MG HALF TABLET
5.0000 mg | ORAL_TABLET | Freq: Every day | ORAL | Status: DC
  Administered 2018-03-06 – 2018-03-27 (×22): 5 mg via ORAL
  Filled 2018-03-06 (×23): qty 1

## 2018-03-06 MED ORDER — ACETAMINOPHEN 500 MG PO TABS
500.0000 mg | ORAL_TABLET | Freq: Three times a day (TID) | ORAL | Status: DC
  Administered 2018-03-06 – 2018-04-01 (×100): 500 mg via ORAL
  Filled 2018-03-06 (×101): qty 1

## 2018-03-06 NOTE — Progress Notes (Signed)
Occupational Therapy Session Note  Patient Details  Name: Kyle Richardson MRN: [id number] Date of Birth: 12/30/1927  Today's Date: 03/06/2018  Session 1 OT Individual Time: 1133-1200 OT Individual Time Calculation (min): 27 min   Session 2 OT Individual Time: 1345-1445 OT Individual Time Calculation (min): 60 min   Short Term Goals: Week 1:  OT Short Term Goal 1 (Week 1): Pt will locate 2/4 grooming items on R side of the sink with mod instructional cues. OT Short Term Goal 2 (Week 1): Pt will complete toilet transfer with max A of 1 caregiver. OT Short Term Goal 3 (Week 1): Pt will wash 4/10 body parts with multimodal cues and set-up A OT Short Term Goal 4 (Week 1): Pt will complete 1/4 steps to don shirt with min instructional cues.  Skilled Therapeutic Interventions/Progress Updates:  Session 1   Pt greeted sitting in TIS wc and agreeable to OT treatment session. Pt fidgeting but unable to state what he needed. Asked pt if he needed to go to the bathroom or change his brief and he stated yes. Used Stedy for sit<>stand with max of 1 and verbal cues to initiate stand. Pt incontinent of smear of BM. Pt with lateral lean to the R standing in stedy and needed max verbal cues to correct. Pt needed min>Max A for standing balance depending on fatigue while OT assisted with washing buttocks and peri-area and changing brief. Tried to have pt assist with pulling up pants, but continued to need assistance 2/2 motor planning deficits. Pt returned to wc and left tilted with safety belt on and needs met.  Session 2 Pt greeted sitting in wc with daughter and spouse present. Pt's spouse stated that pt told her he needed to "poop." Asked pt if he needed to have a BM and he stated  "yes." Stedy used to transfer pt onto commode with 2 helpers. With initiate sit<>stand in Stedy, pt with R LE flexor tone bringing R LE off of Stedy and needed max A to place it back. Pt with continent void of bladder and successful  large BM. Total A for clothing management and brief change. B UE coordination and R attention with hand washing task at the sink. Guided A to use R UE appropriately 2/2 apraxia. Pt brought to the gym and worked on sit<>stand in parallel bars facing mirror. Mod/max A sit<>stand, then had +2 assist with reaching activity to the L to facilitate trunk extension. Pt tolerated 6 mins standing x 3. Pt returned to room at end of session and completed stand-pivot with max A to the L back to bed. Pt left semi-reclined with bed alarm on and needs met.    Therapy Documentation Precautions:  Precautions Precautions: Fall Precaution Comments: R hemiplegia, increased R hamstring tone Restrictions Weight Bearing Restrictions: No Pain:  none/denies pain ADL: ADL ADL Comments: Please see functional navigator   See Function Navigator for Current Functional Status.   Therapy/Group: Individual Therapy   S  03/06/2018, 2:47 PM 

## 2018-03-06 NOTE — Patient Care Conference (Signed)
Inpatient RehabilitationTeam Conference and Plan of Care Update Date: 03/06/2018   Time: 11:00 AM    Patient Name: Kyle Richardson      Medical Record Number: [id number]  Date of Birth: [birth year] Sex: Male         Room/Bed: 4W24C/4W24C-01 Payor Info: Payor: [employer] / Plan: [employer] / Product Type: *No Product type* /    Admitting Diagnosis: L CVA  Admit Date/Time:  02/27/2018  3:28 PM Admission Comments: No comment available   Primary Diagnosis:  Acute ischemic left PCA stroke (HCC) Principal Problem: Acute ischemic left PCA stroke [facility])  Patient Active Problem List   Diagnosis Date Noted  . Acute ischemic left PCA stroke ([place]) 02/27/2018  . Diabetes mellitus type 2 in nonobese (HCC)   . Glaucoma   . MG, ocular (myasthenia gravis) ([place])   . CVA (cerebral vascular accident) ([place]) 02/24/2018  . Right homonymous hemianopsia 02/23/2018  . Unresponsive episode 02/22/2018  . UTI (urinary tract infection) 01/28/2018  . Abnormal chest CT 09/20/2017  . Double vision 01/21/2017  . Ascending aortic aneurysm ([place]) 12/22/2016  . Blood-tinged sputum 10/23/2016  . Cough 10/23/2016  . PAF (paroxysmal atrial fibrillation) ([place]) 09/14/2016  . Health care maintenance 12/26/2014  . Stress 07/05/2014  . Migraine 05/10/2013  . Type 2 diabetes mellitus with complication, without long-term current use of insulin ([place]) 08/20/2012  . Hypertension 08/20/2012  . Hypercholesterolemia 08/20/2012  . CAD (coronary artery disease) 08/20/2012    Expected Discharge Date: Expected Discharge Date: 03/28/18  Team Members Present: Physician leading conference: Dr. Alysia Penna Social Worker Present: Alfonse Alpers, LCSW Nurse Present: Other (comment)(Mekedes Nida, LPN) PT Present: Michaelene Song, PT OT Present: Cherylynn Ridges, OT SLP Present: Weston Anna, SLP PPS Coordinator present : Daiva Nakayama, RN, CRRN     Current Status/Progress Goal Weekly Team Focus  Medical   Comprehension improving as is repetition, still max A mobility, feels depressed  Maintain medical stablity, reduce  Work on attention to RIght    Bowel/Bladder   Incontinent of B/b  Regain continence  Timed toileting   Swallow/Nutrition/ Hydration             ADL's   max/total A +2 overall  MIn A  Modified bathing/dressing, R attention, R visual scanning, functional use of R UE, apraxia, sit<>stand, transfers   Mobility   max-total assist overall, can be up to +2 w/ fatigue, gait remains unable  min assist  transfers and sit<>stands, initiating gait at rail, RLE flexor tone management, RLE NMR,    Communication   Mod A for verbal expression, Min A for comprehension  Overall Min A  naming and verbal expression at the phrase/sentence level, following mildly complex commands    Safety/Cognition/ Behavioral Observations  Mod-Max A  Min A  Visual scanning to right, attention, problem solving and recall    Pain   Denies pain  Pain < 2  Assess qshift and prn   Skin   skin intact  prevent skin breakdown/infection  assess qshift and prn    Rehab Goals Patient on target to meet rehab goals: Yes Rehab Goals Revised: none *See Care Plan and progress notes for long and short-term goals.     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              OT                  SLP                SW                Discharge Planning/Teaching Needs:  Pt's family intends to take pt home and family/paid caregivers will provide 24/7 minimal assistance.  Family/caregivers would come in prior to discharge to receive family education in order to care for pt properly.   Team Discussion:  Pt with aphasia, more receptive  than expressive.  Pt also has right hemiparesis, right field cut, and apraxia.  Pt is doing pretty well medically.  Pt is incontinent of both bowel and bladder and had some confusion last night and did not sleep well, tried to get out of bed.  Pt is max to total +2 with OT.  He can initiate ADLs with familiar tasks, but not outside of that.  Pt's apraxia makes using his right side challenging and he needs lot of help and cues.  Pt is mod A with bed mobility and is max with txs to the left, total with txs to the right.  He is max +2 with stedy and +2 total assist to take any steps.  ST feels pt's communication/functional conversation has improved and he can repeat back to staff.  Pt is hard of hearing.  Pt told ST he was feeling depressed, especially as his awareness, memory, attention have improved.  CSW to refer pt to neuropsychologist.    Revisions to Treatment Plan:  none    Continued Need for Acute Rehabilitation Level of Care: The patient requires daily medical management by a physician with specialized training in physical medicine and rehabilitation for the following conditions: Daily direction of a multidisciplinary physical rehabilitation program to ensure safe treatment while eliciting the highest outcome that is of practical value to the patient.: Yes Daily medical management of patient stability for increased activity during participation in an intensive rehabilitation regime.: Yes Daily analysis of laboratory values and/or radiology reports with any subsequent need for medication adjustment of medical intervention for : Neurological problems  Latacha Texeira, [name] 03/06/2018, 2:20 PM

## 2018-03-06 NOTE — Progress Notes (Signed)
Speech Language Pathology Daily Session Note  Patient Details  Name: Kyle Richardson MRN: [id number] Date of Birth: [birth year]  Today's Date: 03/06/2018 SLP Individual Time: 0915-1000 SLP Individual Time Calculation (min): 45 min  Short Term Goals: Week 1: SLP Short Term Goal 1 (Week 1): Patient will scan to midline in 50% of opportunities with Max A multimodal cues.  SLP Short Term Goal 2 (Week 1): Patient will demonstrate sustained attention to tasks for ~10 minutes with Min A verbal cues for redirection.  SLP Short Term Goal 3 (Week 1): Patient will follow basic commands with 75% accuracy and Mod A verbal cues.  SLP Short Term Goal 4 (Week 1): Patient will name functional items with 75% accuracy and Max A multimodal cues.  SLP Short Term Goal 5 (Week 1): Patient will verbally respond to open-ended questions accurately in 50% of opportuniteis with Max A multimodal cues.   Skilled Therapeutic Interventions: Skilled treatment session focused on cognitive-linguistic goals. Upon arrival, patient was awake while upright in bed and appeared restless. NT aware and scanned bladder. Patient required cathing and required Max A verbal cues to recall procedures (how cathing is performed) or that he has been cathed before due to severely impaired short-term memory. Patient tolerated well and reported that he felt much more comfortable. Patient named functional items with 80% accuracy and Mod A multimodal cues with intermittent verbal perseveration noted. Patient unable to decode at the word level despite Max A multimodal cues. Patient left upright in bed with alarm on and all needs within reach. Continue with current plan of care.      Function:   Cognition Comprehension Comprehension assist level: Understands basic 50 - 74% of the time/ requires cueing 25 - 49% of the time  Expression   Expression assist level: Expresses basic 50 - 74% of the time/requires cueing 25 - 49% of the time. Needs to repeat  parts of sentences.  Social Interaction Social Interaction assist level: Interacts appropriately 25 - 49% of time - Needs frequent redirection.  Problem Solving Problem solving assist level: Solves basic less than 25% of the time - needs direction nearly all the time or does not effectively solve problems and may need a restraint for safety  Memory Memory assist level: Recognizes or recalls less than 25% of the time/requires cueing greater than 75% of the time    Pain No/Denies Pain   Therapy/Group: Individual Therapy  Hadli Vandemark 03/06/2018, 12:44 PM

## 2018-03-06 NOTE — Plan of Care (Signed)
  Problem: Consults Goal: RH STROKE PATIENT EDUCATION Description See Patient Education module for education specifics  Outcome: Progressing   Problem: RH BOWEL ELIMINATION Goal: RH STG MANAGE BOWEL WITH ASSISTANCE Description STG Manage Bowel with mod Assistance.  Outcome: Progressing Goal: RH STG MANAGE BOWEL W/MEDICATION W/ASSISTANCE Description STG Manage Bowel with Medication with mod Assistance.  Outcome: Progressing   Problem: RH BLADDER ELIMINATION Goal: RH STG MANAGE BLADDER WITH ASSISTANCE Description STG Manage Bladder With mod Assistance  Outcome: Progressing   Problem: RH SKIN INTEGRITY Goal: RH STG SKIN FREE OF INFECTION/BREAKDOWN Description Pt will remain free from skin infection/ breakdown with mod assistance.  Outcome: Progressing Goal: RH STG MAINTAIN SKIN INTEGRITY WITH ASSISTANCE Description STG Maintain Skin Integrity With mod Assistance.  Outcome: Progressing   Problem: RH SAFETY Goal: RH STG ADHERE TO SAFETY PRECAUTIONS W/ASSISTANCE/DEVICE Description STG Adhere to Safety Precautions With mod  Assistance/Device.  Outcome: Progressing   Problem: RH COGNITION-NURSING Goal: RH STG USES MEMORY AIDS/STRATEGIES W/ASSIST TO PROBLEM SOLVE Description STG Uses Memory Aids/Strategies With mod Assistance to Problem Solve.  Outcome: Progressing Goal: RH STG ANTICIPATES NEEDS/CALLS FOR ASSIST W/ASSIST/CUES Description STG Anticipates Needs/Calls for Assist With mod Assistance/Cues.  Outcome: Progressing   Problem: RH KNOWLEDGE DEFICIT Goal: RH STG INCREASE KNOWLEDGE OF DIABETES Outcome: Progressing Goal: RH STG INCREASE KNOWLEDGE OF HYPERTENSION Outcome: Progressing

## 2018-03-06 NOTE — Progress Notes (Signed)
Physical Therapy Session Note  Patient Details  Name: Kyle Richardson MRN: [id number] Date of Birth: 01-02-1928  Today's Date: 03/06/2018 PT Individual Time: 1000-1100 PT Individual Time Calculation (min): 60 min   Short Term Goals: Week 1:  PT Short Term Goal 1 (Week 1): Pt will maintain static sitting balance w/ supervision during functional mobility PT Short Term Goal 2 (Week 1): Pt will perform sit<>stand tranfser w/ max assist x1 PT Short Term Goal 3 (Week 1): Pt will initiate gait training  PT Short Term Goal 4 (Week 1): Pt will attend to R side of body/environment w/ max cues, 100% of time  Skilled Therapeutic Interventions/Progress Updates:    Pt supine in bed upon PT arrival, agreeable to therapy tx and reports back pain, asked RN for pain meds. Pt donned pants with therapist looping pants over LEs and then pt able to perform bridging to pull pants over hips. Pt transferred from supine>sitting EOB with mod assist, donned shirt. Pt performed squat pivot transfer from bed>w/c to the L max assist, manual facilitation for weightshifting. Pt transported to the gym. Pt performed sit<>stands using L handrail with max assist, once in standing working on lateral weightshifting in place, static balance without UE support while maintaining midline. Pt ambulated 2 x 5 ft with +2 assist from therapist, one therapist blocking and advancing R LE while the other therapist provided tactile cues for upright posture and L LE step initiation. Pt transported back to room and left reclined in w/c with needs in reach, QRB in place.   Therapy Documentation Precautions:  Precautions Precautions: Fall Precaution Comments: R hemiplegia, increased R hamstring tone Restrictions Weight Bearing Restrictions: No   See Function Navigator for Current Functional Status.   Therapy/Group: Individual Therapy  Netta Corrigan, PT, DPT 03/06/2018, 7:47 AM

## 2018-03-06 NOTE — Progress Notes (Signed)
Recreational Therapy Session Note  Patient Details  Name: Kyle Richardson MRN: [id number] Date of Birth: Jun 11, 1928 Today's Date: 03/06/2018  TR eval deferred, will continue to monitor through team for future participation. [place] 03/06/2018, 4:17 PM

## 2018-03-06 NOTE — Progress Notes (Signed)
Subjective/Complaints: Still with poor sleep, ? If pt is napping during the day ROS- unreliable due to aphasia Objective: Vital Signs: Blood pressure (!) 141/70, pulse 68, temperature 97.7 F (36.5 C), resp. rate 20, height '5\' 8"'  (1.727 m), weight 73.3 kg (161 lb 9.6 oz), SpO2 97 %. No results found. Results for orders placed or performed during the hospital encounter of 02/27/18 (from the past 72 hour(s))  Glucose, capillary     Status: Abnormal   Collection Time: 03/03/18 11:46 AM  Result Value Ref Range   Glucose-Capillary 186 (H) 65 - 99 mg/dL  Glucose, capillary     Status: Abnormal   Collection Time: 03/03/18  4:50 PM  Result Value Ref Range   Glucose-Capillary 135 (H) 65 - 99 mg/dL  Glucose, capillary     Status: Abnormal   Collection Time: 03/03/18  9:10 PM  Result Value Ref Range   Glucose-Capillary 209 (H) 65 - 99 mg/dL  Basic metabolic panel     Status: Abnormal   Collection Time: 03/04/18  5:01 AM  Result Value Ref Range   Sodium 140 135 - 145 mmol/L   Potassium 4.0 3.5 - 5.1 mmol/L   Chloride 105 101 - 111 mmol/L   CO2 26 22 - 32 mmol/L   Glucose, Bld 126 (H) 65 - 99 mg/dL   BUN 22 (H) 6 - 20 mg/dL   Creatinine, Ser 1.03 0.61 - 1.24 mg/dL   Calcium 8.8 (L) 8.9 - 10.3 mg/dL   GFR calc non Af Amer >60 >60 mL/min   GFR calc Af Amer >60 >60 mL/min    Comment: (NOTE) The eGFR has been calculated using the CKD EPI equation. This calculation has not been validated in all clinical situations. eGFR's persistently <60 mL/min signify possible Chronic Kidney Disease.    Anion gap 9 5 - 15    Comment: Performed at [facility] [address], [place], [place] [address]  Glucose, capillary     Status: Abnormal   Collection Time: 03/04/18  6:52 AM  Result Value Ref Range   Glucose-Capillary 131 (H) 65 - 99 mg/dL  Glucose, capillary     Status: Abnormal   Collection Time: 03/04/18 12:00 PM  Result Value Ref Range   Glucose-Capillary 179 (H) 65 - 99 mg/dL   Glucose, capillary     Status: Abnormal   Collection Time: 03/04/18  4:36 PM  Result Value Ref Range   Glucose-Capillary 148 (H) 65 - 99 mg/dL  Glucose, capillary     Status: Abnormal   Collection Time: 03/04/18  9:27 PM  Result Value Ref Range   Glucose-Capillary 152 (H) 65 - 99 mg/dL  Glucose, capillary     Status: Abnormal   Collection Time: 03/05/18  6:38 AM  Result Value Ref Range   Glucose-Capillary 133 (H) 65 - 99 mg/dL  Glucose, capillary     Status: Abnormal   Collection Time: 03/05/18 11:58 AM  Result Value Ref Range   Glucose-Capillary 175 (H) 65 - 99 mg/dL  Glucose, capillary     Status: Abnormal   Collection Time: 03/05/18  4:45 PM  Result Value Ref Range   Glucose-Capillary 135 (H) 65 - 99 mg/dL  Glucose, capillary     Status: Abnormal   Collection Time: 03/05/18  9:39 PM  Result Value Ref Range   Glucose-Capillary 135 (H) 65 - 99 mg/dL  Glucose, capillary     Status: Abnormal   Collection Time: 03/06/18  6:29 AM  Result Value  Ref Range   Glucose-Capillary 146 (H) 65 - 99 mg/dL     HEENT: normal Cardio: irregular and No murmurs Resp: CTA B/L and Unlabored GI: BS positive and Nontender nondistended Extremity:  No Edema Skin:   Intact Neuro: Confused, Abnormal Sensory Not assessed secondary to aphasia, Abnormal Motor Difficult to assess secondary to apraxia no movement to command on the right side although tone is normal, Aphasic and Apraxic Musc/Skel:  Other No pain with upper limb or lower limb range of motion General no acute distress   Assessment/Plan: 1. Functional deficits secondary to left PCA infarct with right hemiparesis, receptive aphasia which require 3+ hours per day of interdisciplinary therapy in a comprehensive inpatient rehab setting. Physiatrist is providing close team supervision and 24 hour management of active medical problems listed below. Physiatrist and rehab team continue to assess barriers to discharge/monitor patient progress toward  functional and medical goals. FIM: Function - Bathing Position: Wheelchair/chair at sink Body parts bathed by patient: Chest, Abdomen, Front perineal area, Right arm, Left arm Body parts bathed by helper: Buttocks, Back Assist Level: 2 helpers  Function- Upper Body Dressing/Undressing What is the patient wearing?: Pull over shirt/dress Pull over shirt/dress - Perfomed by patient: Put head through opening, Pull shirt over trunk Pull over shirt/dress - Perfomed by helper: Thread/unthread right sleeve, Thread/unthread left sleeve, Put head through opening, Pull shirt over trunk Assist Level: Touching or steadying assistance(Pt > 75%) Function - Lower Body Dressing/Undressing What is the patient wearing?: Pants Position: Bed Underwear - Performed by helper: Thread/unthread right underwear leg, Thread/unthread left underwear leg, Pull underwear up/down Pants- Performed by helper: Thread/unthread right pants leg, Pull pants up/down, Thread/unthread left pants leg Non-skid slipper socks- Performed by helper: Don/doff left sock, Don/doff right sock Assist for footwear: Dependant Assist for lower body dressing: 2 Helpers  Function - Toileting Toileting activity did not occur: No continent bowel/bladder event Toileting steps completed by helper: Adjust clothing prior to toileting, Performs perineal hygiene, Adjust clothing after toileting Assist level: Two helpers  Function - Air cabin crew transfer activity did not occur: Safety/medical concerns Toilet transfer assistive device: Facilities manager lift: Stedy Assist level to toilet: 2 helpers(per Candice Applewhite, NT) Assist level from toilet: 2 helpers  Function - Chair/bed transfer Chair/bed transfer method: Squat pivot Chair/bed transfer assist level: Maximal assist (Pt 25 - 49%/lift and lower) Chair/bed transfer assistive device: Armrests Mechanical lift: Stedy Chair/bed transfer details: Manual facilitation for  placement, Manual facilitation for weight shifting, Verbal cues for sequencing, Verbal cues for technique  Function - Locomotion: Wheelchair Will patient use wheelchair at discharge?: Yes Type: Manual Wheelchair activity did not occur: Safety/medical concerns Assist Level: Dependent (Pt equals 0%)(for transport) Wheel 50 feet with 2 turns activity did not occur: Safety/medical concerns Wheel 150 feet activity did not occur: Safety/medical concerns Turns around,maneuvers to table,bed, and toilet,negotiates 3% grade,maneuvers on rugs and over doorsills: No Function - Locomotion: Ambulation Ambulation activity did not occur: Safety/medical concerns Walk 10 feet activity did not occur: Safety/medical concerns Walk 50 feet with 2 turns activity did not occur: Safety/medical concerns Walk 150 feet activity did not occur: Safety/medical concerns Walk 10 feet on uneven surfaces activity did not occur: Safety/medical concerns  Function - Comprehension Comprehension: Auditory Comprehension assistive device: Hearing aids Comprehension assist level: Understands basic 50 - 74% of the time/ requires cueing 25 - 49% of the time  Function - Expression Expression: Verbal Expression assist level: Expresses basic 50 - 74% of the time/requires cueing 25 -  49% of the time. Needs to repeat parts of sentences.  Function - Social Interaction Social Interaction assist level: Interacts appropriately 25 - 49% of time - Needs frequent redirection.  Function - Problem Solving Problem solving assist level: Solves basic less than 25% of the time - needs direction nearly all the time or does not effectively solve problems and may need a restraint for safety  Function - Memory Memory assist level: Recognizes or recalls less than 25% of the time/requires cueing greater than 75% of the time Patient normally able to recall (first 3 days only): None of the above   Medical Problem List and Plan: 1.  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 Cont PT OT speech  reviewed notes Team conference today please see physician documentation under team conference tab, met with team face-to-face to discuss problems,progress, and goals. Formulized individual treatment plan based on medical history, underlying problem and comorbidities. 2.  DVT Prophylaxis/Anticoagulation: Pharmaceutical: Other (comment)--Eliquis this will serve as DVT prophylaxis as well 3. Pain Management: tylenol prn.  4. Mood: Team to provide ego support to help with frustration.  LCSW to follow for evaluation and support when appropriate.  On Prozac 5. Neuropsych: This patient is not capable of making decisions on his own behalf. 6. Skin/Wound Care: routine pressure relief measures.  7. Fluids/Electrolytes/Nutrition: Monitor I/O. Add full supervision at meals for safety., BMET normal 5/27 8. PAF/CAD: Monitor HR bid. ON metoprolol and Lipitor.  Vitals:   03/05/18 2004 03/06/18 0440  BP: (!) 159/64 (!) 141/70  Pulse: 61 68  Resp: 18 20  Temp: 98.6 F (37 C) 97.7 F (36.5 C)  SpO2: 96% 97%   low dose amlodipine started 5/28 , monitor  9. T2DM: Hgb A1C- 7.1.  Monitor BS ac/hs and use SSI for elevated BS. Now on metformin daily. Due to  GI distress will D/C CBG (last 3)  Recent Labs    03/05/18 1645 03/05/18 2139 03/06/18 0629  GLUCAP 135* 135* 146*  Controlled 5/29  10. Myasthenia Gravis with intermittent diplopia: On mestonin 30 mg bid--to be increased to 60 mg bid per discharge summary, daughter doesn't want him to have this, had EMG confirmation. Monitor for fatiguability , ocular symptoms, if this is limiting therapy will revisit this with daughter as pt cannot make a decision, pt also cannot provide subjective symptoms 11. Glaucoma: Continue Ocuvite bid and  Xalatan at bedtime.  12.  Hypoalb- prostat 13.  Reports of hypoxemia responding to O2  CXR,showed bibasilar atelectasis-IS 14.   Insomnia will check sleep graph LOS (Days) 7 A FACE TO FACE EVALUATION WAS PERFORMED  Kyle Richardson 03/06/2018, 8:41 AM

## 2018-03-07 ENCOUNTER — Inpatient Hospital Stay (HOSPITAL_COMMUNITY): Payer: PPO | Admitting: Occupational Therapy

## 2018-03-07 ENCOUNTER — Inpatient Hospital Stay (HOSPITAL_COMMUNITY): Payer: PPO | Admitting: Speech Pathology

## 2018-03-07 ENCOUNTER — Inpatient Hospital Stay (HOSPITAL_COMMUNITY): Payer: PPO | Admitting: Physical Therapy

## 2018-03-07 LAB — GLUCOSE, CAPILLARY
GLUCOSE-CAPILLARY: 164 mg/dL — AB (ref 65–99)
Glucose-Capillary: 120 mg/dL — ABNORMAL HIGH (ref 65–99)
Glucose-Capillary: 123 mg/dL — ABNORMAL HIGH (ref 65–99)
Glucose-Capillary: 136 mg/dL — ABNORMAL HIGH (ref 65–99)

## 2018-03-07 NOTE — Progress Notes (Signed)
Social Work Patient ID: [name], male   DOB: Jan 14, 1928, 84 y.o.   MRN: [id number]   CSW met with pt's wife and dtr to update them on team conference discussion and targeted d/c date of 03-28-18.  Family is committed to taking pt home and they are working on securing caregivers to be with pt and his wife 24/7.  CSW explained to wife that they would need assistance through the night, as she thought they would be okay at night, but CSW reminded her of pt's potential need to use the bathroom at night, an emergency, etc.  She also planned to have her neighbor be "on call" for them.  CSW reiterated the above need for 24/7 care and she understood both points.  Pt also has a long term care policy that dtr plans to review and see how to initiate that.  They plan to use a combination of paid caregivers and family to provide 24/7 care.  CSW also talked with dtr about some concerns about medications and times given.  CSW relayed this to PA, Reesa Chew.  She will address these medication concerns.  CSW also met with pt to let him know that team feels he is progressing, but they want 3 more weeks with him so that he can make even more progress.  Pt seemed to understand.  CSW will continue to follow and assist as needed.

## 2018-03-07 NOTE — Progress Notes (Signed)
Occupational Therapy Weekly Progress Note  Patient Details  Name: Kyle Richardson MRN: [id number] Date of Birth: January 07, 1928  Beginning of progress report period: Feb 28, 2018 End of progress report period: Mar 07, 2018  Patient has met 2 of 4 short term goals.  Pt is making slow but steady progress with OT. Pt has progressed with sit<>stands and is able to use the Stedy to transfer with 1 person assist. He does exhibit increased apraxia when attempting to use the R UE and increased wrist and digit flexion overpower all movements.   Sit to stand transitions are at a mod/max assist with LB selfcare.  He still needs max assist for all dressing tasks for both UB and LB. Pt also continues to have R inattention and R visual field deficits. He can scan to midline with max multimodal cues, but rarely can get past midline to the R. Continue current OT POC.     Patient continues to demonstrate the following deficits: muscle weakness, impaired timing and sequencing, abnormal tone, unbalanced muscle activation, motor apraxia, ataxia, decreased coordination and decreased motor planning, decreased visual perceptual skills and field cut, decreased midline orientation, decreased attention to right, right side neglect, decreased motor planning and ideational apraxia, decreased initiation, decreased attention, decreased awareness, decreased problem solving, decreased safety awareness, decreased memory and delayed processing and decreased sitting balance, decreased standing balance, decreased postural control, hemiplegia and decreased balance strategies and therefore will continue to benefit from skilled OT intervention to enhance overall performance with BADL and Reduce care partner burden.  Patient progressing toward long term goals..  Continue plan of care.  OT Short Term Goals Week 1:  OT Short Term Goal 1 (Week 1): Pt will locate 2/4 grooming items on R side of the sink with mod instructional cues. OT Short Term  Goal 1 - Progress (Week 1): Progressing toward goal OT Short Term Goal 2 (Week 1): Pt will complete toilet transfer with max A of 1 caregiver. OT Short Term Goal 2 - Progress (Week 1): Progressing toward goal OT Short Term Goal 3 (Week 1): Pt will wash 4/10 body parts with multimodal cues and set-up A OT Short Term Goal 3 - Progress (Week 1): Met OT Short Term Goal 4 (Week 1): Pt will complete 1/4 steps to don shirt with min instructional cues. OT Short Term Goal 4 - Progress (Week 1): Met Week 2:  OT Short Term Goal 1 (Week 2): Pt will locate 1/4 grooming items on R side of the sink with mod instructional cues. OT Short Term Goal 2 (Week 2): Pt will complete toilet transfer with max A of 1 caregiver. OT Short Term Goal 3 (Week 2): Pt will tolerate standing at the sink for 3 mins with no more than mod A  in preparation for BADL task   Therapy Documentation Precautions:  Precautions Precautions: Fall Precaution Comments: R hemiplegia, increased R hamstring tone Restrictions Weight Bearing Restrictions: No Pain: Pain Assessment Pain Scale: 0-10 Pain Score: 0-No pain ADL: ADL ADL Comments: Please see functional navigator   See Function Navigator for Current Functional Status.   Therapy/Group: Individual Therapy  Valma Cava 03/07/2018, 12:40 PM

## 2018-03-07 NOTE — Progress Notes (Signed)
Subjective/Complaints: Discussed RUE apraxia with OT.  Pt does not recognize me despite daily visits for >1 wk Pt has improved verbal output, more appropriate at phrase level ROS- unreliable due to aphasia Objective: Vital Signs: Blood pressure (!) 173/62, pulse (!) 50, temperature 98.8 F (37.1 C), resp. rate 18, height 5\' 8"  (1.727 m), weight 73.3 kg (161 lb 9.6 oz), SpO2 94 %. No results found. Results for orders placed or performed during the hospital encounter of 02/27/18 (from the past 72 hour(s))  Glucose, capillary     Status: Abnormal   Collection Time: 03/04/18 12:00 PM  Result Value Ref Range   Glucose-Capillary 179 (H) 65 - 99 mg/dL  Glucose, capillary     Status: Abnormal   Collection Time: 03/04/18  4:36 PM  Result Value Ref Range   Glucose-Capillary 148 (H) 65 - 99 mg/dL  Glucose, capillary     Status: Abnormal   Collection Time: 03/04/18  9:27 PM  Result Value Ref Range   Glucose-Capillary 152 (H) 65 - 99 mg/dL  Glucose, capillary     Status: Abnormal   Collection Time: 03/05/18  6:38 AM  Result Value Ref Range   Glucose-Capillary 133 (H) 65 - 99 mg/dL  Glucose, capillary     Status: Abnormal   Collection Time: 03/05/18 11:58 AM  Result Value Ref Range   Glucose-Capillary 175 (H) 65 - 99 mg/dL  Glucose, capillary     Status: Abnormal   Collection Time: 03/05/18  4:45 PM  Result Value Ref Range   Glucose-Capillary 135 (H) 65 - 99 mg/dL  Glucose, capillary     Status: Abnormal   Collection Time: 03/05/18  9:39 PM  Result Value Ref Range   Glucose-Capillary 135 (H) 65 - 99 mg/dL  Glucose, capillary     Status: Abnormal   Collection Time: 03/06/18  6:29 AM  Result Value Ref Range   Glucose-Capillary 146 (H) 65 - 99 mg/dL  Glucose, capillary     Status: Abnormal   Collection Time: 03/06/18 12:07 PM  Result Value Ref Range   Glucose-Capillary 157 (H) 65 - 99 mg/dL  Glucose, capillary     Status: Abnormal   Collection Time: 03/06/18  4:51 PM  Result Value  Ref Range   Glucose-Capillary 126 (H) 65 - 99 mg/dL  Glucose, capillary     Status: Abnormal   Collection Time: 03/06/18  8:56 PM  Result Value Ref Range   Glucose-Capillary 132 (H) 65 - 99 mg/dL  Glucose, capillary     Status: Abnormal   Collection Time: 03/07/18  6:55 AM  Result Value Ref Range   Glucose-Capillary 120 (H) 65 - 99 mg/dL     HEENT: normal Cardio: irregular and No murmurs Resp: CTA B/L and Unlabored GI: BS positive and Nontender nondistended Extremity:  No Edema Skin:   Intact Neuro: Confused, Abnormal Sensory Not assessed secondary to aphasia, Abnormal proprioception RUE "can't feel that" Motor Difficult to assess secondary to apraxia no movement to command on the right side although tone is normal, Aphasic and Apraxic Musc/Skel:  Other No pain with upper limb or lower limb range of motion General no acute distress   Assessment/Plan: 1. Functional deficits secondary to left PCA infarct with right hemiparesis, receptive aphasia which require 3+ hours per day of interdisciplinary therapy in a comprehensive inpatient rehab setting. Physiatrist is providing close team supervision and 24 hour management of active medical problems listed below. Physiatrist and rehab team continue to assess barriers to discharge/monitor patient progress  toward functional and medical goals. FIM: Function - Bathing Position: Wheelchair/chair at sink Body parts bathed by patient: Chest, Abdomen, Front perineal area, Right arm, Left arm Body parts bathed by helper: Buttocks, Front perineal area Assist Level: 2 helpers  Function- Upper Body Dressing/Undressing What is the patient wearing?: Pull over shirt/dress Pull over shirt/dress - Perfomed by patient: Put head through opening, Pull shirt over trunk Pull over shirt/dress - Perfomed by helper: Thread/unthread right sleeve, Thread/unthread left sleeve, Put head through opening, Pull shirt over trunk Assist Level: Touching or steadying  assistance(Pt > 75%) Function - Lower Body Dressing/Undressing What is the patient wearing?: Pants Position: Bed Underwear - Performed by helper: Thread/unthread right underwear leg, Thread/unthread left underwear leg, Pull underwear up/down Pants- Performed by helper: Thread/unthread right pants leg, Pull pants up/down, Thread/unthread left pants leg Non-skid slipper socks- Performed by helper: Don/doff left sock, Don/doff right sock Assist for footwear: Dependant Assist for lower body dressing: 2 Helpers  Function - Toileting Toileting activity did not occur: No continent bowel/bladder event Toileting steps completed by helper: Adjust clothing prior to toileting, Performs perineal hygiene, Adjust clothing after toileting Assist level: Two helpers  Function - Air cabin crew transfer activity did not occur: Safety/medical concerns Toilet transfer assistive device: Facilities manager lift: Stedy Assist level to toilet: 2 helpers Assist level from toilet: 2 helpers  Function - Chair/bed transfer Chair/bed transfer method: Squat pivot Chair/bed transfer assist level: Maximal assist (Pt 25 - 49%/lift and lower) Chair/bed transfer assistive device: Armrests Mechanical lift: Stedy Chair/bed transfer details: Manual facilitation for placement, Manual facilitation for weight shifting, Verbal cues for sequencing, Verbal cues for technique  Function - Locomotion: Wheelchair Will patient use wheelchair at discharge?: Yes Type: Manual Wheelchair activity did not occur: Safety/medical concerns Assist Level: Dependent (Pt equals 0%)(for transport) Wheel 50 feet with 2 turns activity did not occur: Safety/medical concerns Wheel 150 feet activity did not occur: Safety/medical concerns Turns around,maneuvers to table,bed, and toilet,negotiates 3% grade,maneuvers on rugs and over doorsills: No Function - Locomotion: Ambulation Ambulation activity did not occur: Safety/medical  concerns Assistive device: Rail in hallway Max distance: 5 ft Assist level: 2 helpers Walk 10 feet activity did not occur: Safety/medical concerns Walk 50 feet with 2 turns activity did not occur: Safety/medical concerns Walk 150 feet activity did not occur: Safety/medical concerns Walk 10 feet on uneven surfaces activity did not occur: Safety/medical concerns  Function - Comprehension Comprehension: Auditory Comprehension assistive device: Hearing aids Comprehension assist level: Understands basic 50 - 74% of the time/ requires cueing 25 - 49% of the time  Function - Expression Expression: Verbal Expression assist level: Expresses basic 50 - 74% of the time/requires cueing 25 - 49% of the time. Needs to repeat parts of sentences.  Function - Social Interaction Social Interaction assist level: Interacts appropriately 25 - 49% of time - Needs frequent redirection.  Function - Problem Solving Problem solving assist level: Solves basic less than 25% of the time - needs direction nearly all the time or does not effectively solve problems and may need a restraint for safety  Function - Memory Memory assist level: Recognizes or recalls less than 25% of the time/requires cueing greater than 75% of the time Patient normally able to recall (first 3 days only): None of the above   Medical Problem List and Plan: 1.  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 Cont PT OT speech  reviewed notes  2.  DVT Prophylaxis/Anticoagulation: Pharmaceutical: Other (  comment)--Eliquis this will serve as DVT prophylaxis as well 3. Pain Management: tylenol prn.  4. Mood: Team to provide ego support to help with frustration.  LCSW to follow for evaluation and support when appropriate.  On Prozac 5. Neuropsych: This patient is not capable of making decisions on his own behalf. 6. Skin/Wound Care: routine pressure relief measures.  7. Fluids/Electrolytes/Nutrition: Monitor  I/O. Add full supervision at meals for safety., BMET normal 5/27 8. PAF/CAD: Monitor HR bid. ON metoprolol and Lipitor.  Vitals:   03/06/18 2000 03/07/18 0415  BP: (!) 155/64 (!) 173/62  Pulse: (!) 58 (!) 50  Resp: 16 18  Temp: 98.1 F (36.7 C) 98.8 F (37.1 C)  SpO2: 95% 94%   low dose amlodipine started 5/28 , monitor prior to dose change  9. T2DM: Hgb A1C- 7.1.  Monitor BS ac/hs and use SSI for elevated BS. Now on metformin daily. Due to  GI distress will D/C CBG (last 3)  Recent Labs    03/06/18 1651 03/06/18 2056 03/07/18 0655  GLUCAP 126* 132* 120*  Controlled 5/30  10. Myasthenia Gravis with intermittent diplopia: On mestonin 30 mg bid--to be increased to 60 mg bid per discharge summary, daughter doesn't want him to have this, had EMG confirmation. Monitor for fatiguability , ocular symptoms, if this is limiting therapy will revisit this with daughter as pt cannot make a decision, pt also cannot provide subjective symptoms 11. Glaucoma: Continue Ocuvite bid and  Xalatan at bedtime.  12.  Hypoalb- prostat 13.  Reports of hypoxemia responding to O2  CXR,showed bibasilar atelectasis-IS 14.  Insomnia will check sleep graph LOS (Days) 8 A FACE TO FACE EVALUATION WAS PERFORMED  Charlett Blake 03/07/2018, 9:19 AM

## 2018-03-07 NOTE — Progress Notes (Signed)
Physical Therapy Weekly Progress Note  Patient Details  Name: Kyle Richardson MRN: [id number] Date of Birth: 1928-09-19  Beginning of progress report period: Feb 28, 2018 End of progress report period: Mar 07, 2018  Today's Date: 03/07/2018 PT Individual Time: 1000-1100 AND 1600-1630 PT Individual Time Calculation (min): 60 min AND 30 min  Patient has met 3 of 4 short term goals. Pt is progressing well with therapies since admission. He is consistently transferring w/o use of lift equipment w/ therapy, utilizing squat pivot technique max assist x1, and performing sit<>stands w/ max assist x1 as well. Gait remains non-functional at this time, as pt requires max assist x2 skilled helpers for safety, postural control, and RLE management to ambulate 5'. He is consistently following simple 1-step commands when not considering pt's hearing deficits and attending to midline and/or R side w/ max multimodal cues, both w/ functional tasks.   Patient continues to demonstrate the following deficits muscle weakness and muscle joint tightness, decreased cardiorespiratoy endurance, impaired timing and sequencing, abnormal tone, unbalanced muscle activation, motor apraxia, decreased coordination and decreased motor planning, decreased visual perceptual skills, decreased midline orientation and decreased attention to right, decreased initiation, decreased attention, decreased awareness, decreased problem solving, decreased safety awareness, decreased memory and delayed processing and decreased sitting balance, decreased standing balance, decreased postural control, hemiplegia and decreased balance strategies and therefore will continue to benefit from skilled PT intervention to increase functional independence with mobility.  Patient progressing toward long term goals..  Continue plan of care.  PT Short Term Goals Week 1:  PT Short Term Goal 1 (Week 1): Pt will maintain static sitting balance w/ supervision during  functional mobility PT Short Term Goal 1 - Progress (Week 1): Met PT Short Term Goal 2 (Week 1): Pt will perform sit<>stand tranfser w/ max assist x1 PT Short Term Goal 2 - Progress (Week 1): Met PT Short Term Goal 3 (Week 1): Pt will initiate gait training  PT Short Term Goal 3 - Progress (Week 1): Met PT Short Term Goal 4 (Week 1): Pt will attend to R side of body/environment w/ max cues, 100% of time PT Short Term Goal 4 - Progress (Week 1): Partly met Week 2:  PT Short Term Goal 1 (Week 2): Pt will ambulate 30' w/ max assist x2 using LRAD PT Short Term Goal 2 (Week 2): Pt will attend to R environment during functional mobility w/ min cues PT Short Term Goal 3 (Week 2): Pt will initiate self-propelling manual w/c PT Short Term Goal 4 (Week 2): Pt will transfer bed<>chair w/ mod assist 50% of the time  Skilled Therapeutic Interventions/Progress Updates:   Session 1:  Pt in TIS chair and agreeable to therapy, no c/o pain throughout session. However pt briefly grimacing w/ passive stretching of R hamstring. Total assist w/c transport to/from gym. Worked on blocked practice of sit<>stands to RW in gym w/ max assist overall. Pt w/ some carryover of technique w/ repetition, however require verbal and tactile cues each time for technique. Mirror placed for visual feedback of upright posture, pt able to self correct w/ min verbal and tactile cues. Manual facilitation of R quad activation w/ sit<>stand transition, mod assist to maintain R knee extension in static stance. Performed pre-gait tasks w/ LLE stepping forward and back for increased automatic R quad activation. Pt w/ decreased ability to clear floor this session compared to previous sessions w/ this therapist, suspect 2/2 addition of RW vs using rail in hallway as pt more  consistently following 1-step simple commands this session. Returned to room and ended session in TIS, call bell within reach and all needs met.   Session 2:  Pt in supine and  easily woken up, agreeable to therapy and no evidence of pain throughout session. Transferred to EOB w/ mod assist and to w/c via squat pivot w/ max assist to L side. Pt able to initiate motor action w/ verbal and visual cues only, manual assist to boost once he initiated transfer. Total assist w/c transport to/from gym. Performed same transfer technique to NuStep w/ max assist. Performed NuStep 5 min @ level 1 and 5 min @ level 3 for LE strengthening and reciprocal movement pattern. Pt utilized BLEs and LUE. Verbal cues only to initiate task, able to attend for entire 5 min while engaging in conversation w/ therapist, focusing on attending to midline w/ eye contact, answering simple questions, and memory tasks. Pt additionally able to maintain neutral RLE alignment on NuStep w/ manual assist. Returned to room and ended session in [place] and in care of daughter, all needs met.   Therapy Documentation Precautions:  Precautions Precautions: Fall Precaution Comments: R hemiplegia, increased R hamstring tone Restrictions Weight Bearing Restrictions: No Vital Signs: Therapy Vitals Temp: 98.8 F (37.1 C) Pulse Rate: (!) 50 Resp: 18 BP: (!) 173/62 Patient Position (if appropriate): Lying Oxygen Therapy SpO2: 94 % O2 Device: Room Air  See Function Navigator for Current Functional Status.  Therapy/Group: Individual Therapy   K Arnette 03/07/2018, 7:54 AM

## 2018-03-07 NOTE — Progress Notes (Signed)
Occupational Therapy Session Note  Patient Details  Name: Kyle Richardson MRN: [id number] Date of Birth: 10-09-1928  Today's Date: 03/07/2018 OT Individual Time: 0802-0900 OT Individual Time Calculation (min): 58 min   Short Term Goals: Week 1:  OT Short Term Goal 1 (Week 1): Pt will locate 2/4 grooming items on R side of the sink with mod instructional cues. OT Short Term Goal 2 (Week 1): Pt will complete toilet transfer with max A of 1 caregiver. OT Short Term Goal 3 (Week 1): Pt will wash 4/10 body parts with multimodal cues and set-up A OT Short Term Goal 4 (Week 1): Pt will complete 1/4 steps to don shirt with min instructional cues.  Skilled Therapeutic Interventions/Progress Updates:    Pt greeted semi-reclined in bed after finishing breakfast with nurse tech. Pt came to sitting EOB with min A and increased time with max multimodal cues to plan movement. Pt came to standing in stedy with mod A and max cues to initiate stand. Bathing completed in stedy at the sink. Pt needed max multimodal cues to even initiate scanning for items at the sink on either side of his body. Guided A for head turns to locate anything at midline or past midline to the R. Worked on standing balance when standing to wash buttocks in [place]. Pt able to stand from raised stedy height with min A and max cues to initiate. Min>mod A for dynamic balance while OT washed buttocks and changed brief. Incorporated R UE into bathing tasks but needed hand over hand A 2/2 exaggerated movements from proprioceptive deficits and apraxia. Pt able to thread R LE into pants today with instructional cues, then came to standing without Stedy and mod A from OT. Hair combing task using mirror feedback with pt able to comb R side of head with only min cues. Pt left seated in TIS wc at end of session with call bell in reach and needs met.   Therapy Documentation Precautions:  Precautions Precautions: Fall Precaution Comments: R hemiplegia,  increased R hamstring tone Restrictions Weight Bearing Restrictions: No Pain:   none/denies pain ADL: ADL ADL Comments: Please see functional navigator   See Function Navigator for Current Functional Status.   Therapy/Group: Individual Therapy  Valma Cava 03/07/2018, 8:47 AM

## 2018-03-07 NOTE — Progress Notes (Signed)
Speech Language Pathology Weekly Progress and Session Note  Patient Details  Name: Kirtis Challis MRN: [id number] Date of Birth: 1928/04/17  Beginning of progress report period: Feb 28, 2018 End of progress report period: Mar 07, 2018  Today's Date: 03/07/2018 SLP Individual Time: 0915-1000 SLP Individual Time Calculation (min): 45 min  Short Term Goals: Week 1: SLP Short Term Goal 1 (Week 1): Patient will scan to midline in 50% of opportunities with Max A multimodal cues.  SLP Short Term Goal 1 - Progress (Week 1): Met SLP Short Term Goal 2 (Week 1): Patient will demonstrate sustained attention to tasks for ~10 minutes with Min A verbal cues for redirection.  SLP Short Term Goal 2 - Progress (Week 1): Met SLP Short Term Goal 3 (Week 1): Patient will follow basic commands with 75% accuracy and Mod A verbal cues.  SLP Short Term Goal 3 - Progress (Week 1): Met SLP Short Term Goal 4 (Week 1): Patient will name functional items with 75% accuracy and Max A multimodal cues.  SLP Short Term Goal 4 - Progress (Week 1): Met SLP Short Term Goal 5 (Week 1): Patient will verbally respond to open-ended questions accurately in 50% of opportuniteis with Max A multimodal cues.  SLP Short Term Goal 5 - Progress (Week 1): Met    New Short Term Goals: Week 2: SLP Short Term Goal 1 (Week 2): Patient will attend to right field of enviornment during functional tasks with Mod A multimodal cues.  SLP Short Term Goal 2 (Week 2): Patient will demonstrate sustained attention to tasks for ~20 minutes with Min A verbal cues for redirection.  SLP Short Term Goal 3 (Week 2): Patient will name functional items with 90% accuracy and Min A multimodal cues.  SLP Short Term Goal 4 (Week 2): Patient will verbally respond approrpriately/accuratlely to 75% of open-ended questions with Min A verbal cues.  SLP Short Term Goal 5 (Week 2): Patient will verbalize at the phrase level during sturctured language tasks with Mod A  multimodal cues.  SLP Short Term Goal 6 (Week 2): Patient will follow basic commands with 90% accuracy and Min A verbal cues.   Weekly Progress Updates: Patient has made excellent gains and has met 5 of 5 STG's this reporting period. Currently, patient demonstrates increased ability to express his wants/needs at the phrase level but continues to require overall Mod A for word-finding during structured tasks. Patient demonstrates increased auditory comprehension with increased ability to follow commands and verbally respond appropriately to open-ended questions with less jargon noted. Patient also demonstrates increased ability to scan past midline to right field of environment intermittently but requires overall Max A multimodal cues during functional tasks. Patient also requires Min A verbal cues for sustained attention to tasks but can be intermittent restless and lethargic. Patient and family education is ongoing. Patient would benefit from continued skilled SLP intervention to maximize his cognitive-linguisitc function prior to discharge.      Intensity: Minumum of 1-2 x/day, 30 to 90 minutes Frequency: 3 to 5 out of 7 days Duration/Length of Stay: 03/28/18 Treatment/Interventions: Cognitive remediation/compensation;Environmental controls;Internal/external aids;Speech/Language facilitation;Therapeutic Activities;Patient/family education;Functional tasks;Cueing hierarchy   Daily Session  Skilled Therapeutic Interventions: Skilled treatment session focused on cognitive-linguistic goals. SLP facilitated session by initially providing Max A which faded to [place] A verbal and visual cues by end of session to scan to his right field of environment to locate specific items. Patient named these items with Max A multimodal cues and required Mod A  verbal cues for problem solving during a basic pattern making task. Patient left upright in wheelchair with all needs within reach and quick release belt on. Continue  with current plan of care.       Function:  Cognition Comprehension Comprehension assist level: Understands basic 50 - 74% of the time/ requires cueing 25 - 49% of the time  Expression   Expression assist level: Expresses basic 50 - 74% of the time/requires cueing 25 - 49% of the time. Needs to repeat parts of sentences.  Social Interaction Social Interaction assist level: Interacts appropriately 50 - 74% of the time - May be physically or verbally inappropriate.  Problem Solving Problem solving assist level: Solves basic less than 25% of the time - needs direction nearly all the time or does not effectively solve problems and may need a restraint for safety  Memory Memory assist level: Recognizes or recalls less than 25% of the time/requires cueing greater than 75% of the time   Pain No/Denies Pain   Therapy/Group: Individual Therapy  Sarayah Bacchi 03/07/2018, 4:17 PM

## 2018-03-08 ENCOUNTER — Inpatient Hospital Stay (HOSPITAL_COMMUNITY): Payer: PPO | Admitting: Occupational Therapy

## 2018-03-08 ENCOUNTER — Inpatient Hospital Stay (HOSPITAL_COMMUNITY): Payer: PPO | Admitting: Physical Therapy

## 2018-03-08 ENCOUNTER — Inpatient Hospital Stay (HOSPITAL_COMMUNITY): Payer: PPO

## 2018-03-08 LAB — GLUCOSE, CAPILLARY
GLUCOSE-CAPILLARY: 129 mg/dL — AB (ref 65–99)
Glucose-Capillary: 187 mg/dL — ABNORMAL HIGH (ref 65–99)
Glucose-Capillary: 113 mg/dL — ABNORMAL HIGH (ref 65–99)
Glucose-Capillary: 140 mg/dL — ABNORMAL HIGH (ref 65–99)

## 2018-03-08 NOTE — Progress Notes (Signed)
Subjective/Complaints:  ROS- unreliable due to aphasia Objective: Vital Signs: Blood pressure (!) 120/59, pulse (!) 53, temperature 97.8 F (36.6 C), resp. rate 18, height 5\' 8"  (1.727 m), weight 73.3 kg (161 lb 9.6 oz), SpO2 94 %. No results found. Results for orders placed or performed during the hospital encounter of 02/27/18 (from the past 72 hour(s))  Glucose, capillary     Status: Abnormal   Collection Time: 03/05/18 11:58 AM  Result Value Ref Range   Glucose-Capillary 175 (H) 65 - 99 mg/dL  Glucose, capillary     Status: Abnormal   Collection Time: 03/05/18  4:45 PM  Result Value Ref Range   Glucose-Capillary 135 (H) 65 - 99 mg/dL  Glucose, capillary     Status: Abnormal   Collection Time: 03/05/18  9:39 PM  Result Value Ref Range   Glucose-Capillary 135 (H) 65 - 99 mg/dL  Glucose, capillary     Status: Abnormal   Collection Time: 03/06/18  6:29 AM  Result Value Ref Range   Glucose-Capillary 146 (H) 65 - 99 mg/dL  Glucose, capillary     Status: Abnormal   Collection Time: 03/06/18 12:07 PM  Result Value Ref Range   Glucose-Capillary 157 (H) 65 - 99 mg/dL  Glucose, capillary     Status: Abnormal   Collection Time: 03/06/18  4:51 PM  Result Value Ref Range   Glucose-Capillary 126 (H) 65 - 99 mg/dL  Glucose, capillary     Status: Abnormal   Collection Time: 03/06/18  8:56 PM  Result Value Ref Range   Glucose-Capillary 132 (H) 65 - 99 mg/dL  Glucose, capillary     Status: Abnormal   Collection Time: 03/07/18  6:55 AM  Result Value Ref Range   Glucose-Capillary 120 (H) 65 - 99 mg/dL  Glucose, capillary     Status: Abnormal   Collection Time: 03/07/18 11:30 AM  Result Value Ref Range   Glucose-Capillary 164 (H) 65 - 99 mg/dL  Glucose, capillary     Status: Abnormal   Collection Time: 03/07/18  4:34 PM  Result Value Ref Range   Glucose-Capillary 123 (H) 65 - 99 mg/dL  Glucose, capillary     Status: Abnormal   Collection Time: 03/07/18  9:43 PM  Result Value Ref  Range   Glucose-Capillary 136 (H) 65 - 99 mg/dL  Glucose, capillary     Status: Abnormal   Collection Time: 03/08/18  6:56 AM  Result Value Ref Range   Glucose-Capillary 129 (H) 65 - 99 mg/dL     HEENT: normal Cardio: irregular and No murmurs Resp: CTA B/L and Unlabored GI: BS positive and Nontender nondistended Extremity:  No Edema Skin:   Intact Neuro: Confused, Abnormal Sensory Not assessed secondary to aphasia, Abnormal proprioception RUE "can't feel that" Motor Difficult to assess secondary to apraxia no movement to command on the right side although tone is normal, Aphasic and Apraxic Musc/Skel:  Other No pain with upper limb or lower limb range of motion General no acute distress   Assessment/Plan: 1. Functional deficits secondary to left PCA infarct with right hemiparesis, receptive aphasia which require 3+ hours per day of interdisciplinary therapy in a comprehensive inpatient rehab setting. Physiatrist is providing close team supervision and 24 hour management of active medical problems listed below. Physiatrist and rehab team continue to assess barriers to discharge/monitor patient progress toward functional and medical goals. FIM: Function - Bathing Position: Wheelchair/chair at sink Body parts bathed by patient: Chest, Abdomen, Front perineal area, Right arm, Left  arm Body parts bathed by helper: Left lower leg, Right lower leg, Left upper leg, Right upper leg, Buttocks Assist Level: Touching or steadying assistance(Pt > 75%)  Function- Upper Body Dressing/Undressing What is the patient wearing?: Pull over shirt/dress Pull over shirt/dress - Perfomed by patient: Put head through opening Pull over shirt/dress - Perfomed by helper: Pull shirt over trunk, Thread/unthread left sleeve, Thread/unthread right sleeve Assist Level: Touching or steadying assistance(Pt > 75%) Function - Lower Body Dressing/Undressing What is the patient wearing?: Pants Position:  Wheelchair/chair at sink Underwear - Performed by helper: Thread/unthread right underwear leg, Thread/unthread left underwear leg, Pull underwear up/down Pants- Performed by patient: Thread/unthread left pants leg Pants- Performed by helper: Thread/unthread right pants leg, Pull pants up/down Non-skid slipper socks- Performed by helper: Don/doff right sock, Don/doff left sock Assist for footwear: Dependant Assist for lower body dressing: Touching or steadying assistance (Pt > 75%)  Function - Toileting Toileting activity did not occur: No continent bowel/bladder event Toileting steps completed by helper: Adjust clothing prior to toileting, Performs perineal hygiene, Adjust clothing after toileting Assist level: Two helpers  Function - Air cabin crew transfer activity did not occur: Safety/medical concerns Toilet transfer assistive device: Facilities manager lift: Stedy Assist level to toilet: 2 helpers Assist level from toilet: 2 helpers  Function - Chair/bed transfer Chair/bed transfer method: Squat pivot Chair/bed transfer assist level: Maximal assist (Pt 25 - 49%/lift and lower) Chair/bed transfer assistive device: Armrests Mechanical lift: Stedy Chair/bed transfer details: Manual facilitation for placement, Manual facilitation for weight shifting, Verbal cues for sequencing, Verbal cues for technique  Function - Locomotion: Wheelchair Will patient use wheelchair at discharge?: Yes Type: Manual Wheelchair activity did not occur: Safety/medical concerns Assist Level: Dependent (Pt equals 0%)(for transport) Wheel 50 feet with 2 turns activity did not occur: Safety/medical concerns Wheel 150 feet activity did not occur: Safety/medical concerns Turns around,maneuvers to table,bed, and toilet,negotiates 3% grade,maneuvers on rugs and over doorsills: No Function - Locomotion: Ambulation Ambulation activity did not occur: Safety/medical concerns Assistive device:  Rail in hallway Max distance: 5 ft Assist level: 2 helpers Walk 10 feet activity did not occur: Safety/medical concerns Walk 50 feet with 2 turns activity did not occur: Safety/medical concerns Walk 150 feet activity did not occur: Safety/medical concerns Walk 10 feet on uneven surfaces activity did not occur: Safety/medical concerns  Function - Comprehension Comprehension: Auditory Comprehension assistive device: Hearing aids Comprehension assist level: Understands basic 50 - 74% of the time/ requires cueing 25 - 49% of the time  Function - Expression Expression: Verbal Expression assist level: Expresses basic 50 - 74% of the time/requires cueing 25 - 49% of the time. Needs to repeat parts of sentences.  Function - Social Interaction Social Interaction assist level: Interacts appropriately 50 - 74% of the time - May be physically or verbally inappropriate.  Function - Problem Solving Problem solving assist level: Solves basic less than 25% of the time - needs direction nearly all the time or does not effectively solve problems and may need a restraint for safety  Function - Memory Memory assist level: Recognizes or recalls less than 25% of the time/requires cueing greater than 75% of the time Patient normally able to recall (first 3 days only): None of the above   Medical Problem List and Plan: 1.  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 Cont PT OT speech severe apraxia RUE  2.  DVT Prophylaxis/Anticoagulation: Pharmaceutical: Other (comment)--Eliquis this will serve as DVT  prophylaxis as well 3. Pain Management: tylenol prn.  4. Mood: Team to provide ego support to help with frustration.  LCSW to follow for evaluation and support when appropriate.  On Prozac 5. Neuropsych: This patient is not capable of making decisions on his own behalf. 6. Skin/Wound Care: routine pressure relief measures.  7. Fluids/Electrolytes/Nutrition: Monitor I/O.  Add full supervision at meals for safety., BMET normal 5/27 8. PAF/CAD: Monitor HR bid. ON metoprolol and Lipitor.  Vitals:   03/07/18 2028 03/08/18 0450  BP: 129/60 (!) 120/59  Pulse: (!) 55 (!) 53  Resp: 16 18  Temp: 97.8 F (36.6 C)   SpO2: 94% 94%   low dose amlodipine started 5/28 , controlled   9. T2DM: Hgb A1C- 7.1.  Monitor BS ac/hs and use SSI for elevated BS. Now on metformin daily. Due to  GI distress will D/C CBG (last 3)  Recent Labs    03/07/18 1634 03/07/18 2143 03/08/18 0656  GLUCAP 123* 136* 129*  Controlled 5/31  10. Myasthenia Gravis with intermittent diplopia: On mestonin 30 mg bid--to be increased to 60 mg bid per discharge summary, daughter doesn't want him to have this, had EMG confirmation. Monitor for fatiguability , ocular symptoms, if this is limiting therapy will revisit this with daughter as pt cannot make a decision, pt also cannot provide subjective symptoms 11. Glaucoma: Continue Ocuvite bid and  Xalatan at bedtime.  12.  Hypoalb- prostat 13.  Reports of hypoxemia responding to O2  CXR,showed bibasilar atelectasis-IS 14.  Insomnia will check sleep graph LOS (Days) 9 A FACE TO FACE EVALUATION WAS PERFORMED  Charlett Blake 03/08/2018, 8:43 AM

## 2018-03-08 NOTE — Progress Notes (Signed)
Physical Therapy Session Note  Patient Details  Name: Kyle Richardson MRN: [id number] Date of Birth: Sep 20, 1928  Today's Date: 03/08/2018 PT Individual Time: 1300-1415 PT Individual Time Calculation (min): 75 min   Short Term Goals: Week 2:  PT Short Term Goal 1 (Week 2): Pt will ambulate 30' w/ max assist x2 using LRAD PT Short Term Goal 2 (Week 2): Pt will attend to R environment during functional mobility w/ min cues PT Short Term Goal 3 (Week 2): Pt will initiate self-propelling manual w/c PT Short Term Goal 4 (Week 2): Pt will transfer bed<>chair w/ mod assist 50% of the time  Skilled Theaupeutic Interventions/Progress Updates:   Pt in TIS and agreeable to therapy, denies pain but requesting to toilet. Performed toilet transfer to elevated toilet in bathroom w/ use of grab bars in bathroom and max assist via stand pivot. Pt w/ continent void and total assist for pericare and LE garment management. Pt able to maintain static standing balance w/ min assist while therapist managed LE garments. Total assist w/c transport to gym. Pt performed cognitive sequencing and attention tasks w/ LUE in sitting while therapist performed w/c management. Tasks required selective attention as pt was in busy therapy gym, attention to task, problem solving, and R body awareness. Max verbal and visual cues at first, fading to min cues to maintain performing task successfully. W/c switched to manual chair to instruct and perform self-propulsion of w/c. Adjusted hard back of new chair for safety. Transferred to new chair via same technique above. Pt practiced self-propelling w/c using L hemi technique, however unable to steer straight w/o min manual assist from therapist. Pt does well w/ LUE or LLE only, increased breakdown of task when both are put together, but able to accomplish functional coordination w/ min-mod assist. Self-propelled in multiple 25' bouts. Returned to room and transferred back to EOB, max assist,  and ended session in supine, call bell within reach and all needs met. Wife present and agreeable to ask for assistance when pt is ready to get OOB after resting. NT made aware.   Therapy Documentation Precautions:  Precautions Precautions: Fall Precaution Comments: R hemiplegia, increased R hamstring tone Restrictions Weight Bearing Restrictions: No Vital Signs: Therapy Vitals Temp: 97.7 F (36.5 C) Pulse Rate: 60 Resp: 17 BP: 109/66 Patient Position (if appropriate): Lying Oxygen Therapy SpO2: 94 % O2 Device: Room Air Pain: Pain Assessment Pain Scale: 0-10 Pain Score: 3  Pain Location: Leg Pain Intervention(s): RN made aware  See Function Navigator for Current Functional Status.   Therapy/Group: Individual Therapy  Marasia Newhall K Arnette 03/08/2018, 2:56 PM

## 2018-03-08 NOTE — Progress Notes (Signed)
+/-   sleep. Continent void x 2 this shift. Bladder scan at 0145=317cc's.  Last void at 0450, for 300cc's, along with small incontinent void. Bladder scan=41cc's. Restless at times, no attempts OOB. Patrici Ranks A

## 2018-03-08 NOTE — Progress Notes (Signed)
Occupational Therapy Session Note  Patient Details  Name: Kyle Richardson MRN: [id number] Date of Birth: 11/21/1927  Today's Date: 03/08/2018  Session 1 OT Individual Time: 0900-1000 OT Individual Time Calculation (min): 60 min   Session 2 OT Individual Time: 1200-1230 OT Individual Time Calculation (min): 30 min    Short Term Goals: Week 2:  OT Short Term Goal 1 (Week 2): Pt will locate 1/4 grooming items on R side of the sink with mod instructional cues. OT Short Term Goal 2 (Week 2): Pt will complete toilet transfer with max A of 1 caregiver. OT Short Term Goal 3 (Week 2): Pt will tolerate standing at the sink for 3 mins with no more than mod A  in preparation for BADL task  Skilled Therapeutic Interventions/Progress Updates:  Session 1   Pt greeted semi-reclined in bed and agreeable to OT treatment session. Stedy used for sit<>stand with increased time, assistance for R LE placement, then min A to stand. Pt completed bathing at the sink in [place] for core strengthening. Guided A to use R UE functionally  2/2 diminished proprioception. Had pt try to look at R hand when reaching with improved accuracy, Pt with much better attention to R side today, and initiated looking at therapist on R side during conversation on multiple occasions.  Pt stated "I don't remember seeing this side of my body" as he looked to the R and touched R leg. Multiple sit<>stands without Stedy with Min/mod A. Pt left seated in TIS wc at end of session with safety belt on and needs met.  Session 2 Pt greeted with family present and agreeable to OT. Worked on self-feeding seated in wc. Incorporated visual scanning to the R to locate all 4 food items with mod cues. Pt initiated looking to the R to converse with OT, and also initiated moving across plate to the R to locate vegetables. Educated pt's family on progress and improved R attention. Min verbal cues for appropriate bites and to continue eating when getting  distracted. Educated family on providing cues to pt for self-feeding and left pt with family present finishing lunch.    Therapy Documentation Precautions:  Precautions Precautions: Fall Precaution Comments: R hemiplegia, increased R hamstring tone Restrictions Weight Bearing Restrictions: No \Pain: Pain Assessment Pain Scale: 0-10 Pain Score: 3  Pain Location: Leg Pain Intervention(s): RN made awarenone/denies pain ADL: ADL ADL Comments: Please see functional navigator   See Function Navigator for Current Functional Status.   Therapy/Group: Individual Therapy  Valma Cava 03/08/2018, 12:59 PM

## 2018-03-08 NOTE — Progress Notes (Signed)
Speech Language Pathology Daily Session Note  Patient Details  Name: Kyle Richardson MRN: [id number] Date of Birth: 1928-01-13  Today's Date: 03/08/2018 SLP Individual Time: 1045-1100 SLP Individual Time Calculation (min): 15 min  Short Term Goals: Week 2: SLP Short Term Goal 1 (Week 2): Patient will attend to right field of enviornment during functional tasks with Mod A multimodal cues.  SLP Short Term Goal 2 (Week 2): Patient will demonstrate sustained attention to tasks for ~20 minutes with Min A verbal cues for redirection.  SLP Short Term Goal 3 (Week 2): Patient will name functional items with 90% accuracy and Min A multimodal cues.  SLP Short Term Goal 4 (Week 2): Patient will verbally respond approrpriately/accuratlely to 75% of open-ended questions with Min A verbal cues.  SLP Short Term Goal 5 (Week 2): Patient will verbalize at the phrase level during sturctured language tasks with Mod A multimodal cues.  SLP Short Term Goal 6 (Week 2): Patient will follow basic commands with 90% accuracy and Min A verbal cues.   Skilled Therapeutic Interventions: Skilled treatment session focused on language goals. Upon arrival, patient was awake and leaning back in tilt-in-space wc. Please note, 15 minutes of this scheduled session was missed 2'/2 toileting needs. Patient completed confrontational naming task with items in [facility] kit with 100% accuracy when given Mod A verbal cues with intermittent verbal perseveration noted; redirected with Min-Mod A verbal cues. Answered "why" questions re: use of objects to improve verbal expression with 75% accuracy; improved to 100% given Mod A verbal cues. Patient left upright in wc with quick release belt donned and all needs within reach. Continue with current plan of care.   Function: Cognition Comprehension Comprehension assist level: Understands basic 50 - 74% of the time/ requires cueing 25 - 49% of the time  Expression   Expression assist level: Expresses  basic 50 - 74% of the time/requires cueing 25 - 49% of the time. Needs to repeat parts of sentences.  Social Interaction Social Interaction assist level: Interacts appropriately 25 - 49% of time - Needs frequent redirection.  Problem Solving Problem solving assist level: Solves basic less than 25% of the time - needs direction nearly all the time or does not effectively solve problems and may need a restraint for safety  Memory Memory assist level: Recognizes or recalls less than 25% of the time/requires cueing greater than 75% of the time    Pain Pain Assessment Pain Scale: 0-10 Pain Score: 3  Pain Location: Leg Pain Intervention(s): RN made aware  Therapy/Group: Individual Therapy  Josanna Hefel A Kendle Turbin 03/08/2018, 12:52 PM

## 2018-03-09 ENCOUNTER — Inpatient Hospital Stay (HOSPITAL_COMMUNITY): Payer: PPO | Admitting: Physical Therapy

## 2018-03-09 DIAGNOSIS — I10 Essential (primary) hypertension: Secondary | ICD-10-CM

## 2018-03-09 DIAGNOSIS — I480 Paroxysmal atrial fibrillation: Secondary | ICD-10-CM

## 2018-03-09 DIAGNOSIS — G7000 Myasthenia gravis without (acute) exacerbation: Secondary | ICD-10-CM

## 2018-03-09 DIAGNOSIS — E119 Type 2 diabetes mellitus without complications: Secondary | ICD-10-CM

## 2018-03-09 LAB — GLUCOSE, CAPILLARY
GLUCOSE-CAPILLARY: 147 mg/dL — AB (ref 65–99)
GLUCOSE-CAPILLARY: 143 mg/dL — AB (ref 65–99)
Glucose-Capillary: 134 mg/dL — ABNORMAL HIGH (ref 65–99)
Glucose-Capillary: 165 mg/dL — ABNORMAL HIGH (ref 65–99)

## 2018-03-09 NOTE — Progress Notes (Signed)
Subjective/Complaints: Patient seen lying in bed this morning.  He states he slept well overnight.  No reported issues.  ROS- unreliable due to aphasia  Objective: Vital Signs: Blood pressure 128/79, pulse 73, temperature (!) 97.4 F (36.3 C), temperature source Oral, resp. rate 19, height 5\' 8"  (1.727 m), weight 73.3 kg (161 lb 9.6 oz), SpO2 94 %. No results found. Results for orders placed or performed during the hospital encounter of 02/27/18 (from the past 72 hour(s))  Glucose, capillary     Status: Abnormal   Collection Time: 03/06/18  8:56 PM  Result Value Ref Range   Glucose-Capillary 132 (H) 65 - 99 mg/dL  Glucose, capillary     Status: Abnormal   Collection Time: 03/07/18  6:55 AM  Result Value Ref Range   Glucose-Capillary 120 (H) 65 - 99 mg/dL  Glucose, capillary     Status: Abnormal   Collection Time: 03/07/18 11:30 AM  Result Value Ref Range   Glucose-Capillary 164 (H) 65 - 99 mg/dL  Glucose, capillary     Status: Abnormal   Collection Time: 03/07/18  4:34 PM  Result Value Ref Range   Glucose-Capillary 123 (H) 65 - 99 mg/dL  Glucose, capillary     Status: Abnormal   Collection Time: 03/07/18  9:43 PM  Result Value Ref Range   Glucose-Capillary 136 (H) 65 - 99 mg/dL  Glucose, capillary     Status: Abnormal   Collection Time: 03/08/18  6:56 AM  Result Value Ref Range   Glucose-Capillary 129 (H) 65 - 99 mg/dL  Glucose, capillary     Status: Abnormal   Collection Time: 03/08/18 11:42 AM  Result Value Ref Range   Glucose-Capillary 187 (H) 65 - 99 mg/dL  Glucose, capillary     Status: Abnormal   Collection Time: 03/08/18  4:41 PM  Result Value Ref Range   Glucose-Capillary 113 (H) 65 - 99 mg/dL  Glucose, capillary     Status: Abnormal   Collection Time: 03/08/18  9:26 PM  Result Value Ref Range   Glucose-Capillary 140 (H) 65 - 99 mg/dL  Glucose, capillary     Status: Abnormal   Collection Time: 03/09/18  7:06 AM  Result Value Ref Range   Glucose-Capillary  134 (H) 65 - 99 mg/dL  Glucose, capillary     Status: Abnormal   Collection Time: 03/09/18 11:40 AM  Result Value Ref Range   Glucose-Capillary 165 (H) 65 - 99 mg/dL  Glucose, capillary     Status: Abnormal   Collection Time: 03/09/18  4:36 PM  Result Value Ref Range   Glucose-Capillary 147 (H) 65 - 99 mg/dL     Constitutional: No distress . Vital signs reviewed. HENT: Normocephalic.  Atraumatic. Eyes: EOMI. No discharge. Cardiovascular: Irregularly irregular. No JVD. Respiratory: CTA Bilaterally. Normal effort. GI: BS +. Non-distended. Musc: No edema or tenderness in extremities. Neuro: Alert. Motor: Limited due to participation and apraxia ?RUE: 0/5 proximal distal ?RLE: Hip flexion, knee extension, ADF 3/5 General no acute distress.  Vital signs reviewed.   Assessment/Plan: 1. Functional deficits secondary to left PCA infarct with right hemiparesis, receptive aphasia which require 3+ hours per day of interdisciplinary therapy in a comprehensive inpatient rehab setting. Physiatrist is providing close team supervision and 24 hour management of active medical problems listed below. Physiatrist and rehab team continue to assess barriers to discharge/monitor patient progress toward functional and medical goals. FIM: Function - Bathing Position: Wheelchair/chair at sink(Stedy at sink) Body parts bathed by patient: Chest, Abdomen,  Front perineal area, Right arm, Left arm, Right upper leg, Left upper leg, Right lower leg, Left lower leg Body parts bathed by helper: Buttocks Assist Level: Touching or steadying assistance(Pt > 75%)  Function- Upper Body Dressing/Undressing What is the patient wearing?: Pull over shirt/dress Pull over shirt/dress - Perfomed by patient: Thread/unthread left sleeve, Put head through opening Pull over shirt/dress - Perfomed by helper: Pull shirt over trunk, Thread/unthread right sleeve Assist Level: Touching or steadying assistance(Pt > 75%) Function -  Lower Body Dressing/Undressing What is the patient wearing?: Pants Position: Wheelchair/chair at sink Underwear - Performed by helper: Thread/unthread right underwear leg, Thread/unthread left underwear leg, Pull underwear up/down Pants- Performed by patient: Thread/unthread left pants leg Pants- Performed by helper: Pull pants up/down, Thread/unthread right pants leg Non-skid slipper socks- Performed by helper: Don/doff right sock, Don/doff left sock Assist for footwear: Dependant Assist for lower body dressing: Touching or steadying assistance (Pt > 75%)  Function - Toileting Toileting activity did not occur: No continent bowel/bladder event Toileting steps completed by helper: Adjust clothing prior to toileting, Performs perineal hygiene, Adjust clothing after toileting Assist level: Two helpers  Function - Air cabin crew transfer activity did not occur: Safety/medical concerns Toilet transfer assistive device: Facilities manager lift: Stedy Assist level to toilet: 2 helpers Assist level from toilet: 2 helpers  Function - Chair/bed transfer Chair/bed transfer method: Squat pivot Chair/bed transfer assist level: Maximal assist (Pt 25 - 49%/lift and lower) Chair/bed transfer assistive device: Armrests Mechanical lift: Stedy Chair/bed transfer details: Manual facilitation for placement, Manual facilitation for weight shifting, Verbal cues for sequencing, Verbal cues for technique  Function - Locomotion: Wheelchair Will patient use wheelchair at discharge?: Yes Type: Manual Wheelchair activity did not occur: Safety/medical concerns Max wheelchair distance: 25' Assist Level: Maximal assistance (Pt 25 - 49%) Wheel 50 feet with 2 turns activity did not occur: Safety/medical concerns Wheel 150 feet activity did not occur: Safety/medical concerns Turns around,maneuvers to table,bed, and toilet,negotiates 3% grade,maneuvers on rugs and over doorsills: No Function -  Locomotion: Ambulation Ambulation activity did not occur: Safety/medical concerns Assistive device: Rail in hallway Max distance: 5 ft Assist level: 2 helpers Walk 10 feet activity did not occur: Safety/medical concerns Walk 50 feet with 2 turns activity did not occur: Safety/medical concerns Walk 150 feet activity did not occur: Safety/medical concerns Walk 10 feet on uneven surfaces activity did not occur: Safety/medical concerns  Function - Comprehension Comprehension: Auditory Comprehension assistive device: Hearing aids Comprehension assist level: Understands basic 50 - 74% of the time/ requires cueing 25 - 49% of the time  Function - Expression Expression: Verbal Expression assist level: Expresses basic 50 - 74% of the time/requires cueing 25 - 49% of the time. Needs to repeat parts of sentences.  Function - Social Interaction Social Interaction assist level: Interacts appropriately 25 - 49% of time - Needs frequent redirection.  Function - Problem Solving Problem solving assist level: Solves basic less than 25% of the time - needs direction nearly all the time or does not effectively solve problems and may need a restraint for safety  Function - Memory Memory assist level: Recognizes or recalls less than 25% of the time/requires cueing greater than 75% of the time Patient normally able to recall (first 3 days only): None of the above   Medical Problem List and Plan: 1.  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  Cont CIR 2.  DVT Prophylaxis/Anticoagulation: Pharmaceutical: Other (comment)--Eliquis this will serve  as DVT prophylaxis as well 3. Pain Management: tylenol prn.  4. Mood: Team to provide ego support to help with frustration.  LCSW to follow for evaluation and support when appropriate.  On Prozac 5. Neuropsych: This patient is not capable of making decisions on his own behalf. 6. Skin/Wound Care: routine pressure relief  measures.  7. Fluids/Electrolytes/Nutrition: Monitor I/Os. Added full supervision at meals for safety., BMET within acceptable range on 5/27 8. PAF/CAD/HTN: Monitor HR bid. ON metoprolol and Lipitor.  Vitals:   03/08/18 2032 03/09/18 1638  BP: (!) 148/65 128/79  Pulse: (!) 59 73  Resp: 19   Temp:  (!) 97.4 F (36.3 C)  SpO2: 96% 94%   low dose amlodipine started 5/28   Labile on 6/1 9. T2DM: Hgb A1C- 7.1.  Monitor BS ac/hs and use SSI for elevated BS. Metformin d/ced due to GI Side effects CBG (last 3)  Recent Labs    03/09/18 0706 03/09/18 1140 03/09/18 1636  GLUCAP 134* 165* 147*   Elevated on 6/1, will consider medications tomorrow 10. Myasthenia Gravis with intermittent diplopia: On mestonin 30 mg bid--to be increased to 60 mg bid per discharge summary, daughter doesn't want him to have this, had EMG confirmation. Monitor for fatiguability , ocular symptoms, if this is limiting therapy will revisit this with daughter as pt cannot make a decision, pt also cannot provide subjective symptoms 11. Glaucoma: Continue Ocuvite bid and  Xalatan at bedtime.  12.  Hypoalb- prostat 13.  Reports of hypoxemia responding to O2  CXR,showed bibasilar atelectasis-IS 14.  Insomnia will check sleep graph  LOS (Days) 10 A FACE TO FACE EVALUATION WAS PERFORMED  Mayzie Caughlin Lorie Phenix 03/09/2018, 6:06 PM

## 2018-03-09 NOTE — Progress Notes (Signed)
Physical Therapy Session Note  Patient Details  Name: Rondy Krupinski MRN: [id number] Date of Birth: Feb 20, 1928  Today's Date: 03/09/2018 PT Individual Time: 5025-6154 PT Individual Time Calculation (min): 70 min   Short Term Goals: Week 2:  PT Short Term Goal 1 (Week 2): Pt will ambulate 30' w/ max assist x2 using LRAD PT Short Term Goal 2 (Week 2): Pt will attend to R environment during functional mobility w/ min cues PT Short Term Goal 3 (Week 2): Pt will initiate self-propelling manual w/c PT Short Term Goal 4 (Week 2): Pt will transfer bed<>chair w/ mod assist 50% of the time  Skilled Therapeutic Interventions/Progress Updates:   Pt in supine and agreeable to therapy, denies pain. Transferred to EOB w/ min assist and provided mod-max assist to don shirt and thread LE garments. Sit<>stand w/o UE support, max assist, and pt initiated pulling pants over hips w/o prompting. Transferred to w/c via squat pivot w/ max assist. Total assist w/c transport to/from gym for time management. Session focused on NMR while seated on mat w/o back support. Pt able to tolerate sitting for 50-60 minutes w/o back support w/ close supervision for safety, while performing various tasks. Tasks emphasized R attention and body awareness, anterior and lateral weight shifting, facilitating trunk rotation, 2-step command following, and sustained attention. Performed bean bag sorting tasks requiring anterior weight shifting, crossing midline and R attention. Mod-max verbal cues for attention, sequencing, and color identification. Performed ball toss w/ varying ball sizes, pt incorporating RUE w/o prompting from therapist, able to catch w/ both hands in 50% of trials. Additionally performed ball kicking w/ RLE and bean bag grasping w/ RUE to work on R body awareness. No manual facilitation for RLE movement, mod-max manual facilitation for RUE fine motor tasks. Pt able to perform gross RUE motor tasks w/o assist. Returned to room  and ended session tilted in TIS, call bell within reach and all needs met. Quick release belt and chair alarm engaged.   Therapy Documentation Precautions:  Precautions Precautions: Fall Precaution Comments: R hemiplegia, increased R hamstring tone Restrictions Weight Bearing Restrictions: No  See Function Navigator for Current Functional Status.   Therapy/Group: Individual Therapy  Collin Hendley K Arnette 03/09/2018, 10:55 AM

## 2018-03-09 NOTE — Plan of Care (Signed)
  Problem: RH BLADDER ELIMINATION Goal: RH STG MANAGE BLADDER WITH ASSISTANCE Description: STG Manage Bladder With mod Assistance Outcome: Not Progressing; incontinence   Problem: RH BOWEL ELIMINATION Goal: RH STG MANAGE BOWEL WITH ASSISTANCE Description: STG Manage Bowel with mod Assistance. Outcome: Not Progressing; incontinence   

## 2018-03-10 ENCOUNTER — Inpatient Hospital Stay (HOSPITAL_COMMUNITY): Payer: PPO

## 2018-03-10 DIAGNOSIS — R0989 Other specified symptoms and signs involving the circulatory and respiratory systems: Secondary | ICD-10-CM

## 2018-03-10 LAB — GLUCOSE, CAPILLARY
GLUCOSE-CAPILLARY: 167 mg/dL — AB (ref 65–99)
GLUCOSE-CAPILLARY: 187 mg/dL — AB (ref 65–99)
Glucose-Capillary: 133 mg/dL — ABNORMAL HIGH (ref 65–99)
Glucose-Capillary: 153 mg/dL — ABNORMAL HIGH (ref 65–99)

## 2018-03-10 NOTE — Progress Notes (Signed)
Occupational Therapy Session Note  Patient Details  Name: Kyle Richardson MRN: [id number] Date of Birth: 04-25-1928  Today's Date: 03/10/2018  [id number]   Short Term Goals: Week 2:  OT Short Term Goal 1 (Week 2): Pt will locate 1/4 grooming items on R side of the sink with mod instructional cues. OT Short Term Goal 2 (Week 2): Pt will complete toilet transfer with max A of 1 caregiver. OT Short Term Goal 3 (Week 2): Pt will tolerate standing at the sink for 3 mins with no more than mod A  in preparation for BADL task  Skilled Therapeutic Interventions/Progress Updates:    1:1. Pt seated in TIS upon arrival. Pt sit to stand in steady with min A and Vc for using mirror for visual feedback for balance/midline orientation while OT advances pants pas hips. Pt requires HOH A to thread RLE into pants first and pt able to thread LLE into pants while spontaneously incoorporating RUE to pull pants up leg. Pt sit to stand in stedy as stated above then perching on stedy flaps with supervision as OT fastens button of pants. In tx gym, pt sits in [place] with horseshoes placed on R and basketball hoop places L. Pt reaches with MOD HOH A for NMR to grab horse shoe on R, cross midline and hand it on basketball hoop with MAX VC for leaning, reaching forward and grasp/release x10 horseshoes. Exited session with pt seated in TIS, QRB in place and call light in lap.  Therapy Documentation Precautions:  Precautions Precautions: Fall Precaution Comments: R hemiplegia, increased R hamstring tone Restrictions Weight Bearing Restrictions: No  See Function Navigator for Current Functional Status.   Therapy/Group: Individual Therapy  Tonny Branch 03/10/2018, 11:12 AM

## 2018-03-10 NOTE — Plan of Care (Signed)
  Problem: RH BLADDER ELIMINATION Goal: RH STG MANAGE BLADDER WITH ASSISTANCE Description: STG Manage Bladder With mod Assistance Outcome: Not Progressing; incontinence   

## 2018-03-10 NOTE — Progress Notes (Signed)
Subjective/Complaints: Patient seen lying in bed this morning. He states he slept well overnight, confirmed with sleep chart. No reported issues overnight  ROS- unreliable due to aphasia  Objective: Vital Signs: Blood pressure 140/69, pulse (!) 57, temperature 97.6 F (36.4 C), temperature source Oral, resp. rate 18, height 5\' 8"  (1.727 m), weight 73.3 kg (161 lb 9.6 oz), SpO2 96 %. No results found. Results for orders placed or performed during the hospital encounter of 02/27/18 (from the past 72 hour(s))  Glucose, capillary     Status: Abnormal   Collection Time: 03/07/18 11:30 AM  Result Value Ref Range   Glucose-Capillary 164 (H) 65 - 99 mg/dL  Glucose, capillary     Status: Abnormal   Collection Time: 03/07/18  4:34 PM  Result Value Ref Range   Glucose-Capillary 123 (H) 65 - 99 mg/dL  Glucose, capillary     Status: Abnormal   Collection Time: 03/07/18  9:43 PM  Result Value Ref Range   Glucose-Capillary 136 (H) 65 - 99 mg/dL  Glucose, capillary     Status: Abnormal   Collection Time: 03/08/18  6:56 AM  Result Value Ref Range   Glucose-Capillary 129 (H) 65 - 99 mg/dL  Glucose, capillary     Status: Abnormal   Collection Time: 03/08/18 11:42 AM  Result Value Ref Range   Glucose-Capillary 187 (H) 65 - 99 mg/dL  Glucose, capillary     Status: Abnormal   Collection Time: 03/08/18  4:41 PM  Result Value Ref Range   Glucose-Capillary 113 (H) 65 - 99 mg/dL  Glucose, capillary     Status: Abnormal   Collection Time: 03/08/18  9:26 PM  Result Value Ref Range   Glucose-Capillary 140 (H) 65 - 99 mg/dL  Glucose, capillary     Status: Abnormal   Collection Time: 03/09/18  7:06 AM  Result Value Ref Range   Glucose-Capillary 134 (H) 65 - 99 mg/dL  Glucose, capillary     Status: Abnormal   Collection Time: 03/09/18 11:40 AM  Result Value Ref Range   Glucose-Capillary 165 (H) 65 - 99 mg/dL  Glucose, capillary     Status: Abnormal   Collection Time: 03/09/18  4:36 PM  Result  Value Ref Range   Glucose-Capillary 147 (H) 65 - 99 mg/dL  Glucose, capillary     Status: Abnormal   Collection Time: 03/09/18  9:25 PM  Result Value Ref Range   Glucose-Capillary 143 (H) 65 - 99 mg/dL  Glucose, capillary     Status: Abnormal   Collection Time: 03/10/18  6:30 AM  Result Value Ref Range   Glucose-Capillary 133 (H) 65 - 99 mg/dL     Constitutional: No distress . Vital signs reviewed. HENT: Normocephalic.  Atraumatic. Eyes: EOMI. No discharge. Cardiovascular: Irregularly irregular. No JVD. Respiratory: CTA Bilaterally. Normal effort. GI: BS +. Non-distended. Musc: No edema or tenderness in extremities. Neuro: Alert. Motor: Limited due to participation and apraxia ?RUE: 0/5 proximal distal ?RLE: Hip flexion, knee extension, ADF 4/5 ?LLE: 4+/5 proximal to distal General no acute distress.  Vital signs reviewed.   Assessment/Plan: 1. Functional deficits secondary to left PCA infarct with right hemiparesis, receptive aphasia which require 3+ hours per day of interdisciplinary therapy in a comprehensive inpatient rehab setting. Physiatrist is providing close team supervision and 24 hour management of active medical problems listed below. Physiatrist and rehab team continue to assess barriers to discharge/monitor patient progress toward functional and medical goals. FIM: Function - Bathing Position: Wheelchair/chair at sink(Stedy at  sink) Body parts bathed by patient: Chest, Abdomen, Front perineal area, Right arm, Left arm, Right upper leg, Left upper leg, Right lower leg, Left lower leg Body parts bathed by helper: Buttocks Assist Level: Touching or steadying assistance(Pt > 75%)  Function- Upper Body Dressing/Undressing What is the patient wearing?: Pull over shirt/dress Pull over shirt/dress - Perfomed by patient: Thread/unthread left sleeve, Put head through opening Pull over shirt/dress - Perfomed by helper: Pull shirt over trunk, Thread/unthread right  sleeve Assist Level: Touching or steadying assistance(Pt > 75%) Function - Lower Body Dressing/Undressing What is the patient wearing?: Pants Position: Wheelchair/chair at sink Underwear - Performed by helper: Thread/unthread right underwear leg, Thread/unthread left underwear leg, Pull underwear up/down Pants- Performed by patient: Thread/unthread left pants leg Pants- Performed by helper: Pull pants up/down, Thread/unthread right pants leg Non-skid slipper socks- Performed by helper: Don/doff right sock, Don/doff left sock Assist for footwear: Dependant Assist for lower body dressing: Touching or steadying assistance (Pt > 75%)  Function - Toileting Toileting activity did not occur: No continent bowel/bladder event Toileting steps completed by helper: Adjust clothing prior to toileting, Performs perineal hygiene, Adjust clothing after toileting Assist level: Two helpers  Function - Air cabin crew transfer activity did not occur: Safety/medical concerns Toilet transfer assistive device: Facilities manager lift: Stedy Assist level to toilet: 2 helpers Assist level from toilet: 2 helpers  Function - Chair/bed transfer Chair/bed transfer method: Squat pivot Chair/bed transfer assist level: Maximal assist (Pt 25 - 49%/lift and lower) Chair/bed transfer assistive device: Armrests Mechanical lift: Stedy Chair/bed transfer details: Manual facilitation for placement, Manual facilitation for weight shifting, Verbal cues for sequencing, Verbal cues for technique  Function - Locomotion: Wheelchair Will patient use wheelchair at discharge?: Yes Type: Manual Wheelchair activity did not occur: Safety/medical concerns Max wheelchair distance: 25' Assist Level: Maximal assistance (Pt 25 - 49%) Wheel 50 feet with 2 turns activity did not occur: Safety/medical concerns Wheel 150 feet activity did not occur: Safety/medical concerns Turns around,maneuvers to table,bed, and  toilet,negotiates 3% grade,maneuvers on rugs and over doorsills: No Function - Locomotion: Ambulation Ambulation activity did not occur: Safety/medical concerns Assistive device: Rail in hallway Max distance: 5 ft Assist level: 2 helpers Walk 10 feet activity did not occur: Safety/medical concerns Walk 50 feet with 2 turns activity did not occur: Safety/medical concerns Walk 150 feet activity did not occur: Safety/medical concerns Walk 10 feet on uneven surfaces activity did not occur: Safety/medical concerns  Function - Comprehension Comprehension: Auditory Comprehension assistive device: Hearing aids Comprehension assist level: Understands basic 50 - 74% of the time/ requires cueing 25 - 49% of the time  Function - Expression Expression: Verbal Expression assist level: Expresses basic 50 - 74% of the time/requires cueing 25 - 49% of the time. Needs to repeat parts of sentences.  Function - Social Interaction Social Interaction assist level: Interacts appropriately 25 - 49% of time - Needs frequent redirection.  Function - Problem Solving Problem solving assist level: Solves basic less than 25% of the time - needs direction nearly all the time or does not effectively solve problems and may need a restraint for safety  Function - Memory Memory assist level: Recognizes or recalls less than 25% of the time/requires cueing greater than 75% of the time Patient normally able to recall (first 3 days only): None of the above   Medical Problem List and Plan: 1.  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  Cont CIR 2.  DVT Prophylaxis/Anticoagulation: Pharmaceutical: Other (comment)--Eliquis this will serve as DVT prophylaxis as well 3. Pain Management: tylenol prn.  4. Mood: Team to provide ego support to help with frustration.  LCSW to follow for evaluation and support when appropriate.  On Prozac 5. Neuropsych: This patient is not capable of making  decisions on his own behalf. 6. Skin/Wound Care: routine pressure relief measures.  7. Fluids/Electrolytes/Nutrition: Monitor I/Os. Added full supervision at meals for safety., BMET within acceptable range on 5/27 8. PAF/CAD/HTN: Monitor HR bid. ON metoprolol and Lipitor.  Vitals:   03/10/18 0517 03/10/18 0519  BP: (!) 158/67 140/69  Pulse: 61 (!) 57  Resp: 18   Temp: 97.6 F (36.4 C)   SpO2: 96%    low dose amlodipine started 5/28   Slightly labile on 6/2, will consider further medication adjustments if persistently elevated 9. T2DM: Hgb A1C- 7.1.  Monitor BS ac/hs and use SSI for elevated BS. Metformin d/ced due to GI Side effects CBG (last 3)  Recent Labs    03/09/18 1636 03/09/18 2125 03/10/18 0630  GLUCAP 147* 143* 133*   Appears to be improving overall, further patient allergic to sulfa medications 10. Myasthenia Gravis with intermittent diplopia: On mestonin 30 mg bid--to be increased to 60 mg bid per discharge summary, daughter doesn't want him to have this, had EMG confirmation. Monitor for fatiguability , ocular symptoms, if this is limiting therapy will revisit this with daughter as pt cannot make a decision, pt also cannot provide subjective symptoms 11. Glaucoma: Continue Ocuvite bid and  Xalatan at bedtime.  12.  Hypoalb- prostat 13.  Reports of hypoxemia responding to O2  CXR,showed bibasilar atelectasis-IS 14.  Sleep disturbance  Improving  LOS (Days) 11 A FACE TO FACE EVALUATION WAS PERFORMED  Kyle Richardson Lorie Phenix 03/10/2018, 7:51 AM

## 2018-03-11 ENCOUNTER — Inpatient Hospital Stay (HOSPITAL_COMMUNITY): Payer: PPO | Admitting: Occupational Therapy

## 2018-03-11 ENCOUNTER — Inpatient Hospital Stay (HOSPITAL_COMMUNITY): Payer: PPO

## 2018-03-11 ENCOUNTER — Inpatient Hospital Stay (HOSPITAL_COMMUNITY): Payer: PPO | Admitting: Speech Pathology

## 2018-03-11 LAB — CBC
HEMATOCRIT: 45.5 % (ref 39.0–52.0)
HEMOGLOBIN: 15 g/dL (ref 13.0–17.0)
MCH: 31.8 pg (ref 26.0–34.0)
MCHC: 33 g/dL (ref 30.0–36.0)
MCV: 96.6 fL (ref 78.0–100.0)
Platelets: 210 10*3/uL (ref 150–400)
RBC: 4.71 MIL/uL (ref 4.22–5.81)
RDW: 12.8 % (ref 11.5–15.5)
WBC: 6.5 10*3/uL (ref 4.0–10.5)

## 2018-03-11 LAB — GLUCOSE, CAPILLARY
GLUCOSE-CAPILLARY: 151 mg/dL — AB (ref 65–99)
GLUCOSE-CAPILLARY: 124 mg/dL — AB (ref 65–99)
GLUCOSE-CAPILLARY: 149 mg/dL — AB (ref 65–99)
Glucose-Capillary: 126 mg/dL — ABNORMAL HIGH (ref 65–99)

## 2018-03-11 LAB — BASIC METABOLIC PANEL
ANION GAP: 7 (ref 5–15)
BUN: 21 mg/dL — ABNORMAL HIGH (ref 6–20)
CHLORIDE: 106 mmol/L (ref 101–111)
CO2: 28 mmol/L (ref 22–32)
Calcium: 9 mg/dL (ref 8.9–10.3)
Creatinine, Ser: 0.89 mg/dL (ref 0.61–1.24)
GFR calc non Af Amer: >60 mL/min (ref >60)
GLUCOSE: 127 mg/dL — AB (ref 65–99)
POTASSIUM: 3.9 mmol/L (ref 3.5–5.1)
Sodium: 141 mmol/L (ref 135–145)

## 2018-03-11 MED ORDER — AMLODIPINE BESYLATE 5 MG PO TABS
5.0000 mg | ORAL_TABLET | Freq: Every day | ORAL | Status: DC
  Administered 2018-03-12 – 2018-04-01 (×21): 5 mg via ORAL
  Filled 2018-03-11 (×21): qty 1

## 2018-03-11 NOTE — Progress Notes (Signed)
Subjective/Complaints:  No issues overnite  ROS- unreliable due to aphasia  Objective: Vital Signs: Blood pressure (!) 157/56, pulse (!) 47, temperature (!) 97.4 F (36.3 C), resp. rate 18, height 5\' 8"  (1.727 m), weight 73.3 kg (161 lb 9.6 oz), SpO2 95 %. No results found. Results for orders placed or performed during the hospital encounter of 02/27/18 (from the past 72 hour(s))  Glucose, capillary     Status: Abnormal   Collection Time: 03/08/18 11:42 AM  Result Value Ref Range   Glucose-Capillary 187 (H) 65 - 99 mg/dL  Glucose, capillary     Status: Abnormal   Collection Time: 03/08/18  4:41 PM  Result Value Ref Range   Glucose-Capillary 113 (H) 65 - 99 mg/dL  Glucose, capillary     Status: Abnormal   Collection Time: 03/08/18  9:26 PM  Result Value Ref Range   Glucose-Capillary 140 (H) 65 - 99 mg/dL  Glucose, capillary     Status: Abnormal   Collection Time: 03/09/18  7:06 AM  Result Value Ref Range   Glucose-Capillary 134 (H) 65 - 99 mg/dL  Glucose, capillary     Status: Abnormal   Collection Time: 03/09/18 11:40 AM  Result Value Ref Range   Glucose-Capillary 165 (H) 65 - 99 mg/dL  Glucose, capillary     Status: Abnormal   Collection Time: 03/09/18  4:36 PM  Result Value Ref Range   Glucose-Capillary 147 (H) 65 - 99 mg/dL  Glucose, capillary     Status: Abnormal   Collection Time: 03/09/18  9:25 PM  Result Value Ref Range   Glucose-Capillary 143 (H) 65 - 99 mg/dL  Glucose, capillary     Status: Abnormal   Collection Time: 03/10/18  6:30 AM  Result Value Ref Range   Glucose-Capillary 133 (H) 65 - 99 mg/dL  Glucose, capillary     Status: Abnormal   Collection Time: 03/10/18 11:27 AM  Result Value Ref Range   Glucose-Capillary 167 (H) 65 - 99 mg/dL  Glucose, capillary     Status: Abnormal   Collection Time: 03/10/18  4:56 PM  Result Value Ref Range   Glucose-Capillary 153 (H) 65 - 99 mg/dL   Comment 1 Notify RN   Glucose, capillary     Status: Abnormal   Collection Time: 03/10/18  8:56 PM  Result Value Ref Range   Glucose-Capillary 187 (H) 65 - 99 mg/dL  Glucose, capillary     Status: Abnormal   Collection Time: 03/11/18  6:37 AM  Result Value Ref Range   Glucose-Capillary 126 (H) 65 - 99 mg/dL     Constitutional: No distress . Vital signs reviewed. HENT: Normocephalic.  Atraumatic. Eyes: EOMI. No discharge. Cardiovascular: Irregularly irregular. No JVD. Respiratory: CTA Bilaterally. Normal effort. GI: BS +. Non-distended. Musc: No edema or tenderness in extremities. Neuro: Alert. Motor: Limited due to participation and apraxia ?RUE: 0/5 proximal distal ?RLE: Hip flexion, knee extension, ADF 4/5 ?LLE: 4+/5 proximal to distal General no acute distress.  Vital signs reviewed.   Assessment/Plan: 1. Functional deficits secondary to left PCA infarct with right hemiparesis, receptive aphasia which require 3+ hours per day of interdisciplinary therapy in a comprehensive inpatient rehab setting. Physiatrist is providing close team supervision and 24 hour management of active medical problems listed below. Physiatrist and rehab team continue to assess barriers to discharge/monitor patient progress toward functional and medical goals. FIM: Function - Bathing Position: Wheelchair/chair at sink(Stedy at sink) Body parts bathed by patient: Chest, Abdomen, Front perineal area, Right  arm, Left arm, Right upper leg, Left upper leg, Right lower leg, Left lower leg Body parts bathed by helper: Buttocks Assist Level: Touching or steadying assistance(Pt > 75%)  Function- Upper Body Dressing/Undressing What is the patient wearing?: Pull over shirt/dress Pull over shirt/dress - Perfomed by patient: Thread/unthread left sleeve, Put head through opening Pull over shirt/dress - Perfomed by helper: Pull shirt over trunk, Thread/unthread right sleeve Assist Level: Touching or steadying assistance(Pt > 75%) Function - Lower Body Dressing/Undressing What  is the patient wearing?: Pants Position: Wheelchair/chair at sink Underwear - Performed by helper: Thread/unthread right underwear leg, Thread/unthread left underwear leg, Pull underwear up/down Pants- Performed by patient: Thread/unthread left pants leg Pants- Performed by helper: Pull pants up/down, Thread/unthread right pants leg Non-skid slipper socks- Performed by helper: Don/doff right sock, Don/doff left sock Assist for footwear: Dependant Assist for lower body dressing: Touching or steadying assistance (Pt > 75%)  Function - Toileting Toileting activity did not occur: No continent bowel/bladder event Toileting steps completed by helper: Adjust clothing prior to toileting, Performs perineal hygiene, Adjust clothing after toileting Assist level: Two helpers  Function - Air cabin crew transfer activity did not occur: Safety/medical concerns Toilet transfer assistive device: Facilities manager lift: Stedy Assist level to toilet: 2 helpers Assist level from toilet: 2 helpers  Function - Chair/bed transfer Chair/bed transfer method: Squat pivot Chair/bed transfer assist level: Maximal assist (Pt 25 - 49%/lift and lower) Chair/bed transfer assistive device: Armrests Mechanical lift: Stedy Chair/bed transfer details: Manual facilitation for placement, Manual facilitation for weight shifting, Verbal cues for sequencing, Verbal cues for technique  Function - Locomotion: Wheelchair Will patient use wheelchair at discharge?: Yes Type: Manual Wheelchair activity did not occur: Safety/medical concerns Max wheelchair distance: 25' Assist Level: Maximal assistance (Pt 25 - 49%) Wheel 50 feet with 2 turns activity did not occur: Safety/medical concerns Wheel 150 feet activity did not occur: Safety/medical concerns Turns around,maneuvers to table,bed, and toilet,negotiates 3% grade,maneuvers on rugs and over doorsills: No Function - Locomotion: Ambulation Ambulation  activity did not occur: Safety/medical concerns Assistive device: Rail in hallway Max distance: 5 ft Assist level: 2 helpers Walk 10 feet activity did not occur: Safety/medical concerns Walk 50 feet with 2 turns activity did not occur: Safety/medical concerns Walk 150 feet activity did not occur: Safety/medical concerns Walk 10 feet on uneven surfaces activity did not occur: Safety/medical concerns  Function - Comprehension Comprehension: Auditory Comprehension assistive device: Hearing aids Comprehension assist level: Understands basic 50 - 74% of the time/ requires cueing 25 - 49% of the time  Function - Expression Expression: Verbal Expression assist level: Expresses basic 50 - 74% of the time/requires cueing 25 - 49% of the time. Needs to repeat parts of sentences.  Function - Social Interaction Social Interaction assist level: Interacts appropriately 25 - 49% of time - Needs frequent redirection.  Function - Problem Solving Problem solving assist level: Solves basic 25 - 49% of the time - needs direction more than half the time to initiate, plan or complete simple activities  Function - Memory Memory assist level: Recognizes or recalls 25 - 49% of the time/requires cueing 50 - 75% of the time Patient normally able to recall (first 3 days only): None of the above   Medical Problem List and Plan: 1.  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  Cont CIR- aphasia and apraxia improving 2.  DVT Prophylaxis/Anticoagulation: Pharmaceutical: Other (comment)--Eliquis this will serve as DVT prophylaxis as well  3. Pain Management: tylenol prn.  4. Mood: Team to provide ego support to help with frustration.  LCSW to follow for evaluation and support when appropriate.  On Prozac 5. Neuropsych: This patient is not capable of making decisions on his own behalf. 6. Skin/Wound Care: routine pressure relief measures.  7. Fluids/Electrolytes/Nutrition:  Monitor I/Os. Added full supervision at meals for safety., BMET within acceptable range on 5/27 8. PAF/CAD/HTN: Monitor HR bid. ON metoprolol and Lipitor.  Vitals:   03/10/18 2014 03/11/18 0430  BP: (!) 142/59 (!) 157/56  Pulse: 61 (!) 47  Resp: 18 18  Temp: 97.9 F (36.6 C) (!) 97.4 F (36.3 C)  SpO2: 97% 95%   low dose amlodipine started 5/28   Slightly labile on 6/2, will consider further medication adjustments if persistently elevated 9. T2DM: Hgb A1C- 7.1.  Monitor BS ac/hs and use SSI for elevated BS. Metformin d/ced due to GI Side effects CBG (last 3)  Recent Labs    03/10/18 1656 03/10/18 2056 03/11/18 0637  GLUCAP 153* 187* 126*   Controlled , just on SSI 10. Myasthenia Gravis with intermittent diplopia: On mestonin 30 mg bid--to be increased to 60 mg bid per discharge summary, daughter doesn't want him to have this, had EMG confirmation. Monitor for fatiguability , ocular symptoms, if this is limiting therapy will revisit this with daughter as pt cannot make a decision, pt also cannot provide subjective symptoms 11. Glaucoma: Continue Ocuvite bid and  Xalatan at bedtime.  12.  Hypoalb- prostat 13.  Reports of hypoxemia responding to O2  CXR,showed bibasilar atelectasis-IS   LOS (Days) 12 A FACE TO FACE EVALUATION WAS PERFORMED  Charlett Blake 03/11/2018, 8:28 AM

## 2018-03-11 NOTE — Progress Notes (Signed)
Occupational Therapy Session Note  Patient Details  Name: Kyle Richardson MRN: [id number] Date of Birth: Feb 04, 1928  Today's Date: 03/11/2018 OT Individual Time: 1130-1230 OT Individual Time Calculation (min): 60 min   Short Term Goals: Week 2:  OT Short Term Goal 1 (Week 2): Pt will locate 1/4 grooming items on R side of the sink with mod instructional cues. OT Short Term Goal 2 (Week 2): Pt will complete toilet transfer with max A of 1 caregiver. OT Short Term Goal 3 (Week 2): Pt will tolerate standing at the sink for 3 mins with no more than mod A  in preparation for BADL task  Skilled Therapeutic Interventions/Progress Updates:    Pt greeted seated in TIS wc and agreeable to OT treatment session. Declined bathing/dressing today. Pt brought to day room for R UE NMR. Pt came into standing with mod A and multimodal cues to initiate stand. OT facilitated towel pushes for weight bearing NMR. Pt had difficulty understanding what therapist was asking him to do despite multimodal cues. Had pt sit back down and OT demonstrated "push and pull" motion. Mod cues to get pt to look past midline to R hand with improve activation with visual feedback. Pt completed 3 sets of 50 reps of flex/ext with OT's guided assist. Pt returned to room in wc for lunch. Pt set-up for lunch and worked on self-feeding strategies. Verbal cues to try to stab instead of scoop appropriate food items. Incorporated R attention and R visual scanning to locate food items on R side of tray with mod/max verbal cues. Attempted using R UE for 2x bites> Pt needed guided A and max cues to look at R hand for improved accuracy 2/2 apraxia. Pt left seated in TIS wc at end of session with nurse tech and call bell in reach.   Therapy Documentation Precautions:  Precautions Precautions: Fall Precaution Comments: R hemiplegia, increased R hamstring tone Restrictions Weight Bearing Restrictions: No Pain: Pain Assessment Pain Scale: Faces Faces  Pain Scale: No hurt ADL: ADL ADL Comments: Please see functional navigator   See Function Navigator for Current Functional Status.   Therapy/Group: Individual Therapy  Valma Cava 03/11/2018, 12:16 PM

## 2018-03-11 NOTE — Progress Notes (Signed)
Physical Therapy Session Note  Patient Details  Name: Kyle Richardson MRN: [id number] Date of Birth: [birth year]  Today's Date: 03/11/2018 PT Individual Time: 9449-6759 AND 0902-1000 PT Individual Time Calculation (min): 31 min and 58 min   Short Term Goals: Week 2:  PT Short Term Goal 1 (Week 2): Pt will ambulate 30' w/ max assist x2 using LRAD PT Short Term Goal 2 (Week 2): Pt will attend to R environment during functional mobility w/ min cues PT Short Term Goal 3 (Week 2): Pt will initiate self-propelling manual w/c PT Short Term Goal 4 (Week 2): Pt will transfer bed<>chair w/ mod assist 50% of the time  Skilled Therapeutic Interventions/Progress Updates:    Session 1: Pt supine in bed upon PT arrival, agreeable to therapy tx and denies pain. Pt donned pants with assist to loop pants through LEs, pt able to pull pants over hips by bridging. Pt transferred from supine>sitting EOB with supervision, using bedrails. Pt performed squat pivot transfer from bed>w/c with max assist, manual facilitation for weightshifting. Pt seated in w/c working on attention to task, attending to R side and seated balance in order to don shoes. Pt doffs yellow non skid socks with stand by assist, total assist to don clean socks, pt dons shoes bilaterally with supervision and therapist ties shoes total assist. Worked on attention to R side by having pt reach for shoes/socks from therapist sitting on R side. Pt left seated in TIS w/c at end of session with QRB in place.   Session 2: Pt seated in TIS w/c, transported to the gym. Pt performed sit<>stands within parallel bars this session with mod assist x3, once in standing focus on R UE attention to grasp bar. In standing pt worked on pre-gait tasks stepping forward/back in place with each LE, therapist blocking R LE during stance and therapist providing assist for R LE placement when stepping, ataxia and extensor tone noted, x 2 trials. Pt ambulated x 6 ft within parallel  bars with mod assist, therapist providing assist for R LE placement to minimize scissoring, pt initiating steps following verbal commands from therapist. Pt performed pre-gait tasks with RW and R hand orthosis, stepping forward/back in place with each LE, verbal cues for sequencing and step initiation, assist for R foot placement. Pt attempted to ambulate with RW however limited by R LE increased ataxia and pushing to the R. Pt worked on dynamic standing balance and attention to the R in order to reach for cups on the R and set on table on L, fluctuating min-max assist for balance. Pt transported back to room and left seated in TIS w/c hand off to RN.   Therapy Documentation Precautions:  Precautions Precautions: Fall Precaution Comments: R hemiplegia, increased R hamstring tone Restrictions Weight Bearing Restrictions: No   See Function Navigator for Current Functional Status.   Therapy/Group: Individual Therapy  Netta Corrigan, PT, DPT 03/11/2018, 7:49 AM

## 2018-03-11 NOTE — Progress Notes (Signed)
Speech Language Pathology Daily Session Note  Patient Details  Name: Kyle Richardson MRN: [id number] Date of Birth: 1928-06-22  Today's Date: 03/11/2018 SLP Individual Time: 1300-1355 SLP Individual Time Calculation (min): 55 min  Short Term Goals: Week 2: SLP Short Term Goal 1 (Week 2): Patient will attend to right field of enviornment during functional tasks with Mod A multimodal cues.  SLP Short Term Goal 2 (Week 2): Patient will demonstrate sustained attention to tasks for ~20 minutes with Min A verbal cues for redirection.  SLP Short Term Goal 3 (Week 2): Patient will name functional items with 90% accuracy and Min A multimodal cues.  SLP Short Term Goal 4 (Week 2): Patient will verbally respond approrpriately/accuratlely to 75% of open-ended questions with Min A verbal cues.  SLP Short Term Goal 5 (Week 2): Patient will verbalize at the phrase level during sturctured language tasks with Mod A multimodal cues.  SLP Short Term Goal 6 (Week 2): Patient will follow basic commands with 90% accuracy and Min A verbal cues.   Skilled Therapeutic Interventions: Skilled treatment session focused on communication goals. SLP facilitated session by providing Max A multimodal cues for patient to name functional items and to compare/contrast the items at the word and phrase level. Patient also demonstrated increased difficulty answering basic questions in regards to wants/needs despite Max A multimodal cues. Patient appeared to demonstrate increased difficulty with comprehension and verbal expression with increased verbal perseveration. Throughout session, patient verbalized "I'm confused, I don't know." RN aware. Patient transferred back to bed at end of session via the [facility]. Patient left supine in bed with alarm on and all needs within reach. Continue with current plan of care.      Function:   Cognition Comprehension Comprehension assist level: Understands basic 50 - 74% of the time/ requires cueing  25 - 49% of the time  Expression   Expression assist level: Expresses basic 50 - 74% of the time/requires cueing 25 - 49% of the time. Needs to repeat parts of sentences.  Social Interaction Social Interaction assist level: Interacts appropriately 25 - 49% of time - Needs frequent redirection.  Problem Solving Problem solving assist level: Solves basic 25 - 49% of the time - needs direction more than half the time to initiate, plan or complete simple activities  Memory Memory assist level: Recognizes or recalls 25 - 49% of the time/requires cueing 50 - 75% of the time    Pain Pain Assessment Pain Scale: Faces Faces Pain Scale: No hurt  Therapy/Group: Individual Therapy  Amoura Ransier 03/11/2018, 3:29 PM

## 2018-03-12 ENCOUNTER — Inpatient Hospital Stay (HOSPITAL_COMMUNITY): Payer: PPO | Admitting: Occupational Therapy

## 2018-03-12 ENCOUNTER — Inpatient Hospital Stay (HOSPITAL_COMMUNITY): Payer: PPO | Admitting: Physical Therapy

## 2018-03-12 ENCOUNTER — Inpatient Hospital Stay (HOSPITAL_COMMUNITY): Payer: PPO | Admitting: Speech Pathology

## 2018-03-12 LAB — GLUCOSE, CAPILLARY
GLUCOSE-CAPILLARY: 142 mg/dL — AB (ref 65–99)
GLUCOSE-CAPILLARY: 122 mg/dL — AB (ref 65–99)
GLUCOSE-CAPILLARY: 147 mg/dL — AB (ref 65–99)
Glucose-Capillary: 188 mg/dL — ABNORMAL HIGH (ref 65–99)

## 2018-03-12 NOTE — Progress Notes (Signed)
Occupational Therapy Session Note  Patient Details  Name: Kyle Richardson MRN: [id number] Date of Birth: 10-12-1927  Today's Date: 03/12/2018 OT Individual Time: 1004-1100 OT Individual Time Calculation (min): 56 min   Short Term Goals: Week 2:  OT Short Term Goal 1 (Week 2): Pt will locate 1/4 grooming items on R side of the sink with mod instructional cues. OT Short Term Goal 2 (Week 2): Pt will complete toilet transfer with max A of 1 caregiver. OT Short Term Goal 3 (Week 2): Pt will tolerate standing at the sink for 3 mins with no more than mod A  in preparation for BADL task  Skilled Therapeutic Interventions/Progress Updates:    Pt greeted semi-reclined in bed with nursing finishing giving meds. Pt came to sitting EOB with increased time and min A. Stand-pivot to wc on L side with mod A to stand with increased time and min A to pivot with increased time and only min A to facilitate at hips. Bathing/dressing completed wc level at the sink with focus on R attention, functional use of R UE, apraxia, and sit<>stands. Pt more aware of R side today and was able to wash chest and abdomin with set-up A and verbal cues. Able to maintain grip on wash cloth w/ RW with min A today. Multiple sit<>stands with min/mod A, and min/mod A for balance when reaching to wash buttocks and peri-area. Max multimodal cues and hand over hand A to thread R UE first. Max verbal cues for sequencing and motor planning. Pt complete toothbrushing task at the sink with hand over hand A to use R UE to grasp toothbrush, then able to sequence the rest of task with min cues. Pt left seated in wc with safety belt on and needs met.   Therapy Documentation Precautions:  Precautions Precautions: Fall Precaution Comments: R hemiplegia, increased R hamstring tone Restrictions Weight Bearing Restrictions: No Pain:  none/denies pain ADL: ADL ADL Comments: Please see functional navigator   See Function Navigator for Current  Functional Status.   Therapy/Group: Individual Therapy  Valma Cava 03/12/2018, 11:02 AM

## 2018-03-12 NOTE — Progress Notes (Signed)
Speech Language Pathology Daily Session Note  Patient Details  Name: Kyle Richardson MRN: [id number] Date of Birth: [birth year]  Today's Date: 03/12/2018 SLP Individual Time: 0725-0820 SLP Individual Time Calculation (min): 55 min  Short Term Goals: Week 2: SLP Short Term Goal 1 (Week 2): Patient will attend to right field of enviornment during functional tasks with Mod A multimodal cues.  SLP Short Term Goal 2 (Week 2): Patient will demonstrate sustained attention to tasks for ~20 minutes with Min A verbal cues for redirection.  SLP Short Term Goal 3 (Week 2): Patient will name functional items with 90% accuracy and Min A multimodal cues.  SLP Short Term Goal 4 (Week 2): Patient will verbally respond approrpriately/accuratlely to 75% of open-ended questions with Min A verbal cues.  SLP Short Term Goal 5 (Week 2): Patient will verbalize at the phrase level during sturctured language tasks with Mod A multimodal cues.  SLP Short Term Goal 6 (Week 2): Patient will follow basic commands with 90% accuracy and Min A verbal cues.   Skilled Therapeutic Interventions: Skilled treatment session focused on cognitive-linguistic goals. Upon arrival, patient was asleep in bed but was easily awakened. SLP facilitated session by providing Min A verbal cues for problem solving during tray set-up and self-feeding. Patient also attend to right field of environment to locate items on tray with Min A verbal cues. Patient much more alert this session compared to yesterday's session and answered all "wh" questions appropriately at the phrase level with extra time and Min A verbal cues needed for word-finding. Patient demonstrated selective attention to self-feeding in a minimally distracting environment for ~30 minutes with supervision verbal cues. Patient left upright in bed with alarm on and all needs within reach. Continue with current plan of care.      Function:  Eating Eating   Modified Consistency Diet:  No Eating Assist Level: Supervision or verbal cues;Set up assist for   Eating Set Up Assist For: Opening containers;Cutting food       Cognition Comprehension Comprehension assist level: Understands basic 75 - 89% of the time/ requires cueing 10 - 24% of the time  Expression   Expression assist level: Expresses basic 50 - 74% of the time/requires cueing 25 - 49% of the time. Needs to repeat parts of sentences.  Social Interaction Social Interaction assist level: Interacts appropriately 25 - 49% of time - Needs frequent redirection.  Problem Solving Problem solving assist level: Solves basic 25 - 49% of the time - needs direction more than half the time to initiate, plan or complete simple activities  Memory Memory assist level: Recognizes or recalls 25 - 49% of the time/requires cueing 50 - 75% of the time    Pain No/Denies Pain   Therapy/Group: Individual Therapy  Charlesa Ehle 03/12/2018, 8:29 AM

## 2018-03-12 NOTE — Progress Notes (Signed)
Subjective/Complaints:  Feels ok today.  States he didn't have a good day yesterday but unable to explain why.  Denies pains  ROS- unreliable due to aphasia  Objective: Vital Signs: Blood pressure (!) 160/69, pulse (!) 54, temperature 97.6 F (36.4 C), temperature source Oral, resp. rate 18, height '5\' 8"'  (1.727 m), weight 73.3 kg (161 lb 9.6 oz), SpO2 95 %. No results found. Results for orders placed or performed during the hospital encounter of 02/27/18 (from the past 72 hour(s))  Glucose, capillary     Status: Abnormal   Collection Time: 03/09/18 11:40 AM  Result Value Ref Range   Glucose-Capillary 165 (H) 65 - 99 mg/dL  Glucose, capillary     Status: Abnormal   Collection Time: 03/09/18  4:36 PM  Result Value Ref Range   Glucose-Capillary 147 (H) 65 - 99 mg/dL  Glucose, capillary     Status: Abnormal   Collection Time: 03/09/18  9:25 PM  Result Value Ref Range   Glucose-Capillary 143 (H) 65 - 99 mg/dL  Glucose, capillary     Status: Abnormal   Collection Time: 03/10/18  6:30 AM  Result Value Ref Range   Glucose-Capillary 133 (H) 65 - 99 mg/dL  Glucose, capillary     Status: Abnormal   Collection Time: 03/10/18 11:27 AM  Result Value Ref Range   Glucose-Capillary 167 (H) 65 - 99 mg/dL  Glucose, capillary     Status: Abnormal   Collection Time: 03/10/18  4:56 PM  Result Value Ref Range   Glucose-Capillary 153 (H) 65 - 99 mg/dL   Comment 1 Notify RN   Glucose, capillary     Status: Abnormal   Collection Time: 03/10/18  8:56 PM  Result Value Ref Range   Glucose-Capillary 187 (H) 65 - 99 mg/dL  Glucose, capillary     Status: Abnormal   Collection Time: 03/11/18  6:37 AM  Result Value Ref Range   Glucose-Capillary 126 (H) 65 - 99 mg/dL  CBC     Status: None   Collection Time: 03/11/18  7:08 AM  Result Value Ref Range   WBC 6.5 4.0 - 10.5 K/uL   RBC 4.71 4.22 - 5.81 MIL/uL   Hemoglobin 15.0 13.0 - 17.0 g/dL   HCT 45.5 39.0 - 52.0 %   MCV 96.6 78.0 - 100.0 fL   MCH  31.8 26.0 - 34.0 pg   MCHC 33.0 30.0 - 36.0 g/dL   RDW 12.8 11.5 - 15.5 %   Platelets 210 150 - 400 K/uL    Comment: Performed at [facility], [address], [place], [place] [address]  Basic metabolic panel     Status: Abnormal   Collection Time: 03/11/18  7:08 AM  Result Value Ref Range   Sodium 141 135 - 145 mmol/L   Potassium 3.9 3.5 - 5.1 mmol/L   Chloride 106 101 - 111 mmol/L   CO2 28 22 - 32 mmol/L   Glucose, Bld 127 (H) 65 - 99 mg/dL   BUN 21 (H) 6 - 20 mg/dL   Creatinine, Ser 0.89 0.61 - 1.24 mg/dL   Calcium 9.0 8.9 - 10.3 mg/dL   GFR calc non Af Amer >60 >60 mL/min   GFR calc Af Amer >60 >60 mL/min    Comment: (NOTE) The eGFR has been calculated using the CKD EPI equation. This calculation has not been validated in all clinical situations. eGFR's persistently <60 mL/min signify possible Chronic Kidney Disease.    Anion gap 7 5 -  15    Comment: Performed at [facility], [place] [address] [place], [place] [address]  Glucose, capillary     Status: Abnormal   Collection Time: 03/11/18 12:07 PM  Result Value Ref Range   Glucose-Capillary 151 (H) 65 - 99 mg/dL  Glucose, capillary     Status: Abnormal   Collection Time: 03/11/18  4:40 PM  Result Value Ref Range   Glucose-Capillary 124 (H) 65 - 99 mg/dL  Glucose, capillary     Status: Abnormal   Collection Time: 03/11/18  9:30 PM  Result Value Ref Range   Glucose-Capillary 149 (H) 65 - 99 mg/dL  Glucose, capillary     Status: Abnormal   Collection Time: 03/12/18  6:24 AM  Result Value Ref Range   Glucose-Capillary 142 (H) 65 - 99 mg/dL     Constitutional: No distress . Vital signs reviewed. HENT: Normocephalic.  Atraumatic. Eyes: EOMI. No discharge. Cardiovascular: Irregularly irregular. No JVD. Respiratory: CTA Bilaterally. Normal effort. GI: BS +. Non-distended. Musc: No edema or tenderness in extremities. Neuro: Alert. Motor: Limited due to participation and apraxia ?RUE: 0/5 proximal  distal ?RLE: Hip flexion, knee extension, ADF 4/5 ?LLE: 4+/5 proximal to distal General no acute distress.  Vital signs reviewed.   Assessment/Plan: 1. Functional deficits secondary to left PCA infarct with right hemiparesis, receptive aphasia which require 3+ hours per day of interdisciplinary therapy in a comprehensive inpatient rehab setting. Physiatrist is providing close team supervision and 24 hour management of active medical problems listed below. Physiatrist and rehab team continue to assess barriers to discharge/monitor patient progress toward functional and medical goals. FIM: Function - Bathing Position: Wheelchair/chair at sink(Stedy at sink) Body parts bathed by patient: Chest, Abdomen, Front perineal area, Right arm, Left arm, Right upper leg, Left upper leg, Right lower leg, Left lower leg Body parts bathed by helper: Buttocks Assist Level: Touching or steadying assistance(Pt > 75%)  Function- Upper Body Dressing/Undressing What is the patient wearing?: Pull over shirt/dress Pull over shirt/dress - Perfomed by patient: Thread/unthread left sleeve, Put head through opening Pull over shirt/dress - Perfomed by helper: Pull shirt over trunk, Thread/unthread right sleeve Assist Level: Touching or steadying assistance(Pt > 75%) Function - Lower Body Dressing/Undressing What is the patient wearing?: Pants Position: Wheelchair/chair at sink Underwear - Performed by helper: Thread/unthread right underwear leg, Thread/unthread left underwear leg, Pull underwear up/down Pants- Performed by patient: Thread/unthread left pants leg Pants- Performed by helper: Pull pants up/down, Thread/unthread right pants leg Non-skid slipper socks- Performed by helper: Don/doff right sock, Don/doff left sock Assist for footwear: Dependant Assist for lower body dressing: Touching or steadying assistance (Pt > 75%)  Function - Toileting Toileting activity did not occur: No continent bowel/bladder  event Toileting steps completed by helper: Adjust clothing prior to toileting, Performs perineal hygiene, Adjust clothing after toileting Assist level: Two helpers  Function - Air cabin crew transfer activity did not occur: Safety/medical concerns Toilet transfer assistive device: Facilities manager lift: Stedy Assist level to toilet: 2 helpers Assist level from toilet: 2 helpers  Function - Chair/bed transfer Chair/bed transfer method: Stand pivot Chair/bed transfer assist level: Maximal assist (Pt 25 - 49%/lift and lower) Chair/bed transfer assistive device: Armrests, Mechanical lift Mechanical lift: Stedy Chair/bed transfer details: Manual facilitation for placement, Manual facilitation for weight shifting, Verbal cues for sequencing, Verbal cues for technique  Function - Locomotion: Wheelchair Will patient use wheelchair at discharge?: Yes Type: Manual Wheelchair activity did not occur: Safety/medical concerns Max wheelchair  distance: 15' Assist Level: Maximal assistance (Pt 25 - 49%) Wheel 50 feet with 2 turns activity did not occur: Safety/medical concerns Wheel 150 feet activity did not occur: Safety/medical concerns Turns around,maneuvers to table,bed, and toilet,negotiates 3% grade,maneuvers on rugs and over doorsills: No Function - Locomotion: Ambulation Ambulation activity did not occur: Safety/medical concerns Assistive device: Parallel bars Max distance: 6 ft Assist level: 2 helpers(max assist, +2 for w/c follow) Walk 10 feet activity did not occur: Safety/medical concerns Walk 50 feet with 2 turns activity did not occur: Safety/medical concerns Walk 150 feet activity did not occur: Safety/medical concerns Walk 10 feet on uneven surfaces activity did not occur: Safety/medical concerns  Function - Comprehension Comprehension: Auditory Comprehension assistive device: Hearing aids Comprehension assist level: Understands basic 75 - 89% of the time/  requires cueing 10 - 24% of the time  Function - Expression Expression: Verbal Expression assist level: Expresses basic 50 - 74% of the time/requires cueing 25 - 49% of the time. Needs to repeat parts of sentences.  Function - Social Interaction Social Interaction assist level: Interacts appropriately 25 - 49% of time - Needs frequent redirection.  Function - Problem Solving Problem solving assist level: Solves basic 25 - 49% of the time - needs direction more than half the time to initiate, plan or complete simple activities  Function - Memory Memory assist level: Recognizes or recalls 25 - 49% of the time/requires cueing 50 - 75% of the time Patient normally able to recall (first 3 days only): None of the above   Medical Problem List and Plan: 1.  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  Cont CIR- aphasia and apraxia improving- team conf in am 2.  DVT Prophylaxis/Anticoagulation: Pharmaceutical: Other (comment)--Eliquis this will serve as DVT prophylaxis as well 3. Pain Management: tylenol prn.  4. Mood: Team to provide ego support to help with frustration.  LCSW to follow for evaluation and support when appropriate.  On Prozac 5. Neuropsych: This patient is not capable of making decisions on his own behalf. 6. Skin/Wound Care: routine pressure relief measures.  7. Fluids/Electrolytes/Nutrition: Monitor I/Os. Added full supervision at meals for safety., BMET within acceptable range on 5/27 and 6/3 8. PAF/CAD/HTN: Monitor HR bid. ON metoprolol and Lipitor.  Vitals:   03/11/18 2059 03/12/18 0423  BP: (!) 141/68 (!) 160/69  Pulse: 60 (!) 54  Resp: 18 18  Temp: 98 F (36.7 C) 97.6 F (36.4 C)  SpO2: 94% 95%   low dose amlodipine started 5/28   Slightly labile on 6/4, will consider further medication adjustments if persistently elevated 9. T2DM: Hgb A1C- 7.1.  Monitor BS ac/hs and use SSI for elevated BS. Metformin d/ced due to GI Side  effects CBG (last 3)  Recent Labs    03/11/18 1640 03/11/18 2130 03/12/18 0624  GLUCAP 124* 149* 142*   Controlled 6/4, just on SSI 10. Myasthenia Gravis with intermittent diplopia: On mestonin 30 mg bid--to be increased to 60 mg bid per discharge summary, daughter doesn't want him to have this, had EMG confirmation. Monitor for fatiguability , ocular symptoms, if this is limiting therapy will revisit this with daughter as pt cannot make a decision, pt also cannot provide subjective symptoms 11. Glaucoma: Continue Ocuvite bid and  Xalatan at bedtime.  12.  Hypoalb- prostat 13.  Reports of hypoxemia responding to O2  CXR,showed bibasilar atelectasis-IS   LOS (Days) 13 A FACE TO FACE EVALUATION WAS PERFORMED  Charlett Blake 03/12/2018,  8:37 AM

## 2018-03-12 NOTE — Progress Notes (Signed)
Physical Therapy Session Note  Patient Details  Name: Kyle Richardson MRN: [id number] Date of Birth: 04/24/1928  Today's Date: 03/12/2018 PT Individual Time: 1530-1600 PT Individual Time Calculation (min): 30 min   Short Term Goals: Week 2:  PT Short Term Goal 1 (Week 2): Pt will ambulate 30' w/ max assist x2 using LRAD PT Short Term Goal 2 (Week 2): Pt will attend to R environment during functional mobility w/ min cues PT Short Term Goal 3 (Week 2): Pt will initiate self-propelling manual w/c PT Short Term Goal 4 (Week 2): Pt will transfer bed<>chair w/ mod assist 50% of the time  Skilled Therapeutic Interventions/Progress Updates:    Pt supine in bed upon PT arrival, agreeable to therapy tx and denies pain. Pt transferred from supine>sitting EOB with supervision and verbal cues for R side awareness. Pt performed squat pivot transfer from bed>w/c with max assist and transported to the gym. Pt's daughter present for this session. Pt ambulated x 100 ft with RW and mod assist +2 for safety, one therapist guiding RW and second therapist facilitating L lateral weightshift for R LE foot clearance and verbal cues for R LE stepping. Pt performed x 3 sit<>stands with emphasis on anterior trunk lean and techniques. In standing pt worked on static standing balance without UE support while maintaining midline. Pt transported back to room to use urinal, left in care of family seated in TIS.   Therapy Documentation Precautions:  Precautions Precautions: Fall Precaution Comments: R hemiplegia, increased R hamstring tone Restrictions Weight Bearing Restrictions: No   See Function Navigator for Current Functional Status.   Therapy/Group: Individual Therapy  Netta Corrigan, PT, DPT 03/12/2018, 12:09 PM

## 2018-03-12 NOTE — Progress Notes (Signed)
Physical Therapy Session Note  Patient Details  Name: Kyle Richardson MRN: [id number] Date of Birth: 1928/01/10  Today's Date: 03/12/2018 PT Individual Time: 1100-1200 PT Individual Time Calculation (min): 60 min   Short Term Goals: Week 2:  PT Short Term Goal 1 (Week 2): Pt will ambulate 30' w/ max assist x2 using LRAD PT Short Term Goal 2 (Week 2): Pt will attend to R environment during functional mobility w/ min cues PT Short Term Goal 3 (Week 2): Pt will initiate self-propelling manual w/c PT Short Term Goal 4 (Week 2): Pt will transfer bed<>chair w/ mod assist 50% of the time  Skilled Therapeutic Interventions/Progress Updates:   Pt in TIS and agreeable to therapy, denies pain. Session focused on functional gait and standing posture w/ RW. Ambulated at rail in hallway, 30' w/ mod manual assist for upright posture, lateral weight shifting, and occasional min assist to place RLE. Verbal cues for R/L step cadence. Performed 30' w/ RW, max manual assist for upright posture and lateral weight shifting. Less assist for RLE step placement, but mod-max assist for RW management to maintain straight path. Both gait bouts w/ w/c follow for safety, though no R knee buckling occurred. Worked on sit<>stands to RW and maintaining upright posture w/o R lateral pushing. Able to achieve midline stance w/ mod assist when first getting to stance, then able to maintain w/ min guard w/ visual mirror feedback. Performed LUE bean bag reaching tasks to facilitate increased WB on L side while maintaining static standing balance, min guard during reaching tasks. Mod-max verbal and visual cues to complete task as it was a novel task, but once pt understood what was being asked of him, he was able to carry out task w/o assist. He was also able to visually scan environment, including across midline to R side, to find bean bag w/ cues before placing bean bag on the far L side. Brief seated rest breaks 2/2 fatigue, performed bean  bag sorting while in seated to work on attention to task and visually attending to entire environment. Performed Nustep 5 min @ level 4 utilizing all 4 extremities to work on reciprocal movement pattern, but requiring min-mod manual assist to maintain RUE on handle. Returned to room and ended session in TIS, call bell within reach and all needs met.   Therapy Documentation Precautions:  Precautions Precautions: Fall Precaution Comments: R hemiplegia, increased R hamstring tone Restrictions Weight Bearing Restrictions: No  See Function Navigator for Current Functional Status.   Therapy/Group: Individual Therapy  Jackolyn Geron K Arnette 03/12/2018, 12:27 PM

## 2018-03-13 ENCOUNTER — Inpatient Hospital Stay (HOSPITAL_COMMUNITY): Payer: PPO | Admitting: Occupational Therapy

## 2018-03-13 ENCOUNTER — Inpatient Hospital Stay (HOSPITAL_COMMUNITY): Payer: PPO | Admitting: Speech Pathology

## 2018-03-13 ENCOUNTER — Inpatient Hospital Stay (HOSPITAL_COMMUNITY): Payer: PPO

## 2018-03-13 ENCOUNTER — Encounter (HOSPITAL_COMMUNITY): Payer: PPO | Admitting: Psychology

## 2018-03-13 DIAGNOSIS — H53461 Homonymous bilateral field defects, right side: Secondary | ICD-10-CM

## 2018-03-13 LAB — GLUCOSE, CAPILLARY
GLUCOSE-CAPILLARY: 136 mg/dL — AB (ref 65–99)
GLUCOSE-CAPILLARY: 165 mg/dL — AB (ref 65–99)
GLUCOSE-CAPILLARY: 126 mg/dL — AB (ref 65–99)
GLUCOSE-CAPILLARY: 126 mg/dL — AB (ref 65–99)

## 2018-03-13 NOTE — Consult Note (Signed)
Neuropsychological Consultation   Patient:   Kyle Richardson   DOB:   December 24, 1927  MR Number:  [id number]  Location:  [facility] [address] [id number] [place] [address] Dept: [phone] [facility]: [phone]           Date of Service:   03/13/2018  Start Time:   10 AM End Time:   11 AM  Provider/Observer:  Ilean Skill, Psy.D.       Clinical Neuropsychologist       Billing Code/Service: [phone] 4 Units  Chief Complaint:    Kyle Richardson is an 84 year old male with history of CAD, PAF, T2DM, COPD, OSA, HTN and intermittent diplopia.  Kyle Richardson after being found unresponsive outside by his family.  MRI showed left CVA.  Acute thalamus, small acute infarct of left medial temporal lobe, left hippocampus and left thalamus, small acute infarct left occipital lobe and no hemorrhage.  Limited right sided weakness, Wernicke's (receptive) aphasia with receptive deficits and confusion.  Daughter reports that the patient was fully independent prior to stroke.  He was taking care of self and wife and doing all work around home needed.    Reason for Service:  The patient was referred for neuropsychological consultation due to cognitive deficits and confusion.  Below is the HPI for the current admission.    HPI:  Kyle Richardson is an 84 year old male with history of CAD, PAF- on eliquis, T2DM, COPD, OSA- wears oral device,  HTN, intermittent diplopia with diagnosis of MG recently and was admitted on 02/22/18 after unresponsive outside by his family. History taken from chart review. Patient reported difficulty seeing on the right, was confused and right sided weakness.  MRI brain reviewed, showing left CVA. Per report, MRI/MRA brain done revealing acute L-PCA territory infarct affecting left medial temporal lobe, left hippocampus and left thalamus, small acute infarct left occipital lobe and no hemorrhage.  2D  echo showed EF 60-65% with mild LVH, basal inferior hypokinesis and grade 1 DD. Carotid dopplers showed 50-69% heterogenous and partially calcified plaque at right carotid bifurcation. Dr. Tyron Russell recommended continuing Eliquis as well as aggressive management of risk factors.   He continues to have bouts of lethargy but showing improvement in activity tolerance. He continues to be limited by right sided weakness with significant flexor tone, right neglect,  Wernicke's aphasia with  moderate to severe expressive and receptive deficits and confusion. CIR recommended due to functional deficits.   Current Status:  Today during the clinical interview, the patient perseverated on vision changes, with right visual field deficits/cut.  The patient was confused and had clear receptive language deficits.  Patient denied depression but had difficulty organizing thoughts and remained focused on vision changes.  Mental Status Exam highlighted these deficits with some motor deficits but significant visual spatial deficits and cognitive confusion putting information together and attending to various details.  There is likely involvement in both subcortical regions as well as occipital and PTO junction brain regions.    Behavioral Observation: Kyle Richardson  presents as a 84 y.o.-year-old Right Caucasian Male who appeared his stated age. his dress was Appropriate and he was Well Groomed and his manners were Appropriate to the situation.  his participation was indicative of Appropriate and Inattentive behaviors.  There were any physical disabilities noted.  he displayed an appropriate level of cooperation and motivation.     Interactions:    Active Inattentive  Attention:   abnormal and attention span appeared shorter than expected for age  Memory:   abnormal; Primarily retrieval deficits  Visuo-spatial:  abnormal  Speech (Volume):  low  Speech:   fluent aphasia; receptive aphasia  Thought  Process:  Tangential and Disorganized  Though Content:  WNL; not suicidal and not homicidal  Orientation:   person  Judgment:   Poor  Planning:   Poor  Affect:    Blunted  Mood:    Dysphoric  Insight:   Lacking  Intelligence:   normal   Medical History:   Past Medical History:  Diagnosis Date  . 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  s/p PTCA of the mid RCA 1/96, also  s/p  CABG x 4 7/96  . Degenerative disc disease   . Diabetes mellitus ([place])   . Glaucoma   . Hypercholesterolemia   . Hypertension   . Migraine headache    history of  . Osteoarthritis   . Sleep apnea    Psychiatric History:  No prior psychiatric history  Family Med/Psych History:  Family History  Problem Relation Age of Onset  . Heart disease Mother        died age 37  . Heart disease Father        and other vascular disease  . Diabetes Unknown        four siblings  . Heart disease Brother        s/p CABG  . Colon cancer Neg Hx   . Prostate cancer Neg Hx     Risk of Suicide/Violence: virtually non-existent   Impression/DX:  Kyle Richardson is an 84 year old male with history of CAD, PAF, T2DM, COPD, OSA, HTN and intermittent diplopia.  Kyle Richardson after being found unresponsive outside by his family.  MRI showed left CVA.  Acute thalamus, small acute infarct of left medial temporal lobe, left hippocampus and left thalamus, small acute infarct left occipital lobe and no hemorrhage.  Limited right sided weakness, Wernicke's (receptive) aphasia with receptive deficits and confusion.  Daughter reports that the patient was fully independent prior to stroke.  He was taking care of self and wife and doing all work around home needed.    Today during the clinical interview, the patient perseverated on vision changes, with right visual field deficits/cut.  The patient was confused and had clear receptive language deficits.  Patient denied depression but had  difficulty organizing thoughts and remained focused on vision changes.  Mental Status Exam highlighted these deficits with some motor deficits but significant visual spatial deficits and cognitive confusion putting information together and attending to various details.  There is likely involvement in both subcortical regions as well as occipital and PTO junction brain regions.          Electronically Signed   _______________________ Ilean Skill, Psy.D.

## 2018-03-13 NOTE — Progress Notes (Signed)
Physical Therapy Session Note  Patient Details  Name: Kyle Richardson MRN: [id number] Date of Birth: September 14, 1928  Today's Date: 03/13/2018 PT Individual Time: 4268-3419 PT Individual Time Calculation (min): 75 min   Short Term Goals: Week 2:  PT Short Term Goal 1 (Week 2): Pt will ambulate 30' w/ max assist x2 using LRAD PT Short Term Goal 2 (Week 2): Pt will attend to R environment during functional mobility w/ min cues PT Short Term Goal 3 (Week 2): Pt will initiate self-propelling manual w/c PT Short Term Goal 4 (Week 2): Pt will transfer bed<>chair w/ mod assist 50% of the time  Skilled Therapeutic Interventions/Progress Updates:    Pt seated in TIS w/c upon PT arrival, agreeable to therapy tx and denies pain. Pt transported to the gym. Pt ambulated 3 x 60 ft throughout this session from w/c<>mat with mod assist +2 for safety, therapist providing verbal cues for R step length and manual facilitation for weightshift, 2nd person providing assist to guide RW especially with turns to sit. Pt worked on dynamic standing balance while standing without UE support and R UE coordination in order to reach for bean bags, min assist for standing balance, hand over hand assist to find bean bags with R hand. Pt performed x 20 sit<>stands blocked practice from edge of mat with min assist for neuro re-ed. Pt performed stand pivot transfer from mat>TIS to the L with RW and mod assist, verbal cues for sequencing. Pt transported to the dayroom. Pt performed stand pivot transfer from TIS<>nustep with RW and mod assist, verbal cues for sequencing. Pt used nustep x 6 minutes this session for reciprocal UE/LE movement and neuro re-ed. Pt transported back to room and left seated with needs in reach.   Therapy Documentation Precautions:  Precautions Precautions: Fall Precaution Comments: R hemiplegia, increased R hamstring tone Restrictions Weight Bearing Restrictions: No   See Function Navigator for Current  Functional Status.   Therapy/Group: Individual Therapy  Netta Corrigan, PT, DPT 03/13/2018, 7:57 AM

## 2018-03-13 NOTE — Progress Notes (Signed)
Speech Language Pathology Daily Session Note  Patient Details  Name: Kyle Richardson MRN: [id number] Date of Birth: 05/25/1928  Today's Date: 03/13/2018 SLP Individual Time: 0900-1000 SLP Individual Time Calculation (min): 60 min  Short Term Goals: Week 2: SLP Short Term Goal 1 (Week 2): Patient will attend to right field of enviornment during functional tasks with Mod A multimodal cues.  SLP Short Term Goal 2 (Week 2): Patient will demonstrate sustained attention to tasks for ~20 minutes with Min A verbal cues for redirection.  SLP Short Term Goal 3 (Week 2): Patient will name functional items with 90% accuracy and Min A multimodal cues.  SLP Short Term Goal 4 (Week 2): Patient will verbally respond approrpriately/accuratlely to 75% of open-ended questions with Min A verbal cues.  SLP Short Term Goal 5 (Week 2): Patient will verbalize at the phrase level during sturctured language tasks with Mod A multimodal cues.  SLP Short Term Goal 6 (Week 2): Patient will follow basic commands with 90% accuracy and Min A verbal cues.   Skilled Therapeutic Interventions: Skilled treatment session focused on cognitive-linguistic goals. Upon arrival, patient reported he had been incontinent of urine. SLP facilitated session by providing extra time and Min A verbal cues to follow commands and Max A verbal cues for problem solving during self-care tasks. Patient required Max A verbal cues for orientation to time and place but completed generative naming tasks with Mod A verbal cues. Throughout task, patient demonstrated difficulty switching between categories/tasks and required initial Max a verbal cues for comprehension which faded to [place]. Patient left upright in wheelchair with quick release belt in place and all needs within reach. Continue with current plan of care.      Function:   Cognition Comprehension Comprehension assist level: Understands basic 75 - 89% of the time/ requires cueing 10 - 24% of the  time  Expression   Expression assist level: Expresses basic 50 - 74% of the time/requires cueing 25 - 49% of the time. Needs to repeat parts of sentences.  Social Interaction Social Interaction assist level: Interacts appropriately 50 - 74% of the time - May be physically or verbally inappropriate.  Problem Solving Problem solving assist level: Solves basic 25 - 49% of the time - needs direction more than half the time to initiate, plan or complete simple activities  Memory Memory assist level: Recognizes or recalls 25 - 49% of the time/requires cueing 50 - 75% of the time    Pain Pain Assessment Pain Score: 0-No pain  Therapy/Group: Individual Therapy  Lynn Recendiz 03/13/2018, 3:55 PM

## 2018-03-13 NOTE — Progress Notes (Signed)
Subjective/Complaints: LPN notes no new issues overnite  ROS- unreliable due to aphasia, denies pain  Objective: Vital Signs: Blood pressure (!) 155/66, pulse (!) 53, temperature 98 F (36.7 C), temperature source Oral, resp. rate 16, height 5' 8" (1.727 m), weight 73.2 kg (161 lb 6 oz), SpO2 98 %. No results found. Results for orders placed or performed during the hospital encounter of 02/27/18 (from the past 72 hour(s))  Glucose, capillary     Status: Abnormal   Collection Time: 03/10/18 11:27 AM  Result Value Ref Range   Glucose-Capillary 167 (H) 65 - 99 mg/dL  Glucose, capillary     Status: Abnormal   Collection Time: 03/10/18  4:56 PM  Result Value Ref Range   Glucose-Capillary 153 (H) 65 - 99 mg/dL   Comment 1 Notify RN   Glucose, capillary     Status: Abnormal   Collection Time: 03/10/18  8:56 PM  Result Value Ref Range   Glucose-Capillary 187 (H) 65 - 99 mg/dL  Glucose, capillary     Status: Abnormal   Collection Time: 03/11/18  6:37 AM  Result Value Ref Range   Glucose-Capillary 126 (H) 65 - 99 mg/dL  CBC     Status: None   Collection Time: 03/11/18  7:08 AM  Result Value Ref Range   WBC 6.5 4.0 - 10.5 K/uL   RBC 4.71 4.22 - 5.81 MIL/uL   Hemoglobin 15.0 13.0 - 17.0 g/dL   HCT 45.5 39.0 - 52.0 %   MCV 96.6 78.0 - 100.0 fL   MCH 31.8 26.0 - 34.0 pg   MCHC 33.0 30.0 - 36.0 g/dL   RDW 12.8 11.5 - 15.5 %   Platelets 210 150 - 400 K/uL    Comment: Performed at [facility], [place] [address] [place], [place] [address]  Basic metabolic panel     Status: Abnormal   Collection Time: 03/11/18  7:08 AM  Result Value Ref Range   Sodium 141 135 - 145 mmol/L   Potassium 3.9 3.5 - 5.1 mmol/L   Chloride 106 101 - 111 mmol/L   CO2 28 22 - 32 mmol/L   Glucose, Bld 127 (H) 65 - 99 mg/dL   BUN 21 (H) 6 - 20 mg/dL   Creatinine, Ser 0.89 0.61 - 1.24 mg/dL   Calcium 9.0 8.9 - 10.3 mg/dL   GFR calc non Af Amer >60 >60 mL/min   GFR calc Af Amer >60 >60 mL/min    Comment:  (NOTE) The eGFR has been calculated using the CKD EPI equation. This calculation has not been validated in all clinical situations. eGFR's persistently <60 mL/min signify possible Chronic Kidney Disease.    Anion gap 7 5 - 15    Comment: Performed at [facility] [address], [place], [place] [address]  Glucose, capillary     Status: Abnormal   Collection Time: 03/11/18 12:07 PM  Result Value Ref Range   Glucose-Capillary 151 (H) 65 - 99 mg/dL  Glucose, capillary     Status: Abnormal   Collection Time: 03/11/18  4:40 PM  Result Value Ref Range   Glucose-Capillary 124 (H) 65 - 99 mg/dL  Glucose, capillary     Status: Abnormal   Collection Time: 03/11/18  9:30 PM  Result Value Ref Range   Glucose-Capillary 149 (H) 65 - 99 mg/dL  Glucose, capillary     Status: Abnormal   Collection Time: 03/12/18  6:24 AM  Result Value Ref Range   Glucose-Capillary  142 (H) 65 - 99 mg/dL  Glucose, capillary     Status: Abnormal   Collection Time: 03/12/18 12:05 PM  Result Value Ref Range   Glucose-Capillary 122 (H) 65 - 99 mg/dL  Glucose, capillary     Status: Abnormal   Collection Time: 03/12/18  4:53 PM  Result Value Ref Range   Glucose-Capillary 188 (H) 65 - 99 mg/dL  Glucose, capillary     Status: Abnormal   Collection Time: 03/12/18  9:48 PM  Result Value Ref Range   Glucose-Capillary 147 (H) 65 - 99 mg/dL  Glucose, capillary     Status: Abnormal   Collection Time: 03/13/18  6:16 AM  Result Value Ref Range   Glucose-Capillary 136 (H) 65 - 99 mg/dL     Constitutional: No distress . Vital signs reviewed. HENT: Normocephalic.  Atraumatic. Eyes: EOMI. No discharge. Cardiovascular: Irregularly irregular. No JVD. Respiratory: CTA Bilaterally. Normal effort. GI: BS +. Non-distended. Musc: No edema or tenderness in extremities. Neuro: Alert. Motor: Limited due to participation and apraxia ?RUE: 0/5 proximal distal ?RLE: Hip flexion, knee extension, ADF 4/5 ?LLE: 4+/5 proximal  to distal General no acute distress.  Vital signs reviewed.   Assessment/Plan: 1. Functional deficits secondary to left PCA infarct with right hemiparesis, receptive aphasia which require 3+ hours per day of interdisciplinary therapy in a comprehensive inpatient rehab setting. Physiatrist is providing close team supervision and 24 hour management of active medical problems listed below. Physiatrist and rehab team continue to assess barriers to discharge/monitor patient progress toward functional and medical goals. FIM: Function - Bathing Position: Wheelchair/chair at sink Body parts bathed by patient: Chest, Abdomen, Front perineal area, Right arm, Left arm, Right upper leg, Left upper leg, Right lower leg, Left lower leg Body parts bathed by helper: Buttocks Assist Level: Touching or steadying assistance(Pt > 75%)  Function- Upper Body Dressing/Undressing What is the patient wearing?: Pull over shirt/dress Pull over shirt/dress - Perfomed by patient: Thread/unthread left sleeve Pull over shirt/dress - Perfomed by helper: Put head through opening, Pull shirt over trunk, Thread/unthread right sleeve Assist Level: Touching or steadying assistance(Pt > 75%) Function - Lower Body Dressing/Undressing What is the patient wearing?: Pants Position: Wheelchair/chair at sink Underwear - Performed by helper: Thread/unthread right underwear leg, Thread/unthread left underwear leg, Pull underwear up/down Pants- Performed by patient: Thread/unthread left pants leg Pants- Performed by helper: Pull pants up/down, Thread/unthread right pants leg Non-skid slipper socks- Performed by helper: Don/doff right sock, Don/doff left sock Assist for footwear: Dependant Assist for lower body dressing: Touching or steadying assistance (Pt > 75%)  Function - Toileting Toileting activity did not occur: No continent bowel/bladder event Toileting steps completed by helper: Adjust clothing prior to toileting, Performs  perineal hygiene, Adjust clothing after toileting Assist level: Two helpers  Function - Air cabin crew transfer activity did not occur: Safety/medical concerns Toilet transfer assistive device: Facilities manager lift: Stedy Assist level to toilet: 2 helpers Assist level from toilet: 2 helpers  Function - Chair/bed transfer Chair/bed transfer method: Squat pivot Chair/bed transfer assist level: Moderate assist (Pt 50 - 74%/lift or lower) Chair/bed transfer assistive device: Armrests Mechanical lift: Stedy Chair/bed transfer details: Manual facilitation for placement, Manual facilitation for weight shifting, Verbal cues for sequencing, Verbal cues for technique  Function - Locomotion: Wheelchair Will patient use wheelchair at discharge?: Yes Type: Manual Wheelchair activity did not occur: Safety/medical concerns Max wheelchair distance: 25' Assist Level: Maximal assistance (Pt 25 - 49%) Wheel 50 feet with 2 turns  activity did not occur: Safety/medical concerns Wheel 150 feet activity did not occur: Safety/medical concerns Turns around,maneuvers to table,bed, and toilet,negotiates 3% grade,maneuvers on rugs and over doorsills: No Function - Locomotion: Ambulation Ambulation activity did not occur: Safety/medical concerns Assistive device: Walker-rolling Max distance: 100 ft Assist level: 2 helpers Walk 10 feet activity did not occur: Safety/medical concerns Assist level: 2 helpers Walk 50 feet with 2 turns activity did not occur: Safety/medical concerns Assist level: 2 helpers Walk 150 feet activity did not occur: Safety/medical concerns Walk 10 feet on uneven surfaces activity did not occur: Safety/medical concerns  Function - Comprehension Comprehension: Auditory Comprehension assistive device: Hearing aids Comprehension assist level: Understands basic 75 - 89% of the time/ requires cueing 10 - 24% of the time  Function - Expression Expression:  Verbal Expression assist level: Expresses basic 50 - 74% of the time/requires cueing 25 - 49% of the time. Needs to repeat parts of sentences.  Function - Social Interaction Social Interaction assist level: Interacts appropriately 25 - 49% of time - Needs frequent redirection.  Function - Problem Solving Problem solving assist level: Solves basic 25 - 49% of the time - needs direction more than half the time to initiate, plan or complete simple activities  Function - Memory Memory assist level: Recognizes or recalls 25 - 49% of the time/requires cueing 50 - 75% of the time Patient normally able to recall (first 3 days only): None of the above   Medical Problem List and Plan: 1.  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  Cont CIR- aphasia and apraxia improving, Team conference today please see physician documentation under team conference tab, met with team face-to-face to discuss problems,progress, and goals. Formulized individual treatment plan based on medical history, underlying problem and comorbidities. 2.  DVT Prophylaxis/Anticoagulation: Pharmaceutical: Other (comment)--Eliquis this will serve as DVT prophylaxis as well 3. Pain Management: tylenol prn.  4. Mood: Team to provide ego support to help with frustration.  LCSW to follow for evaluation and support when appropriate.  On Prozac 5. Neuropsych: This patient is not capable of making decisions on his own behalf. 6. Skin/Wound Care: routine pressure relief measures.  7. Fluids/Electrolytes/Nutrition: Monitor I/Os. Added full supervision at meals for safety., BMET within acceptable range on 5/27 and 6/3, I 431m recorded with meals 8. PAF/CAD/HTN: Monitor HR bid. ON metoprolol and Lipitor.  Vitals:   03/12/18 1956 03/13/18 0439  BP: 119/66 (!) 155/66  Pulse: 61 (!) 53  Resp: 18 16  Temp: 98.4 F (36.9 C) 98 F (36.7 C)  SpO2: 96% 98%   low dose amlodipine started 5/28 labile on 6/5,  will consider further medication adjustments if persistently elevated 9. T2DM: Hgb A1C- 7.1.  Monitor BS ac/hs and use SSI for elevated BS. Metformin d/ced due to GI Side effects CBG (last 3)  Recent Labs    03/12/18 1653 03/12/18 2148 03/13/18 0616  GLUCAP 188* 147* 136*   Controlled 6/5, just on SSI 10. Myasthenia Gravis with intermittent diplopia: On mestonin 30 mg bid--to be increased to 60 mg bid per discharge summary, daughter doesn't want him to have this, had EMG confirmation. Monitor for fatiguability , ocular symptoms, if this is limiting therapy will revisit this with daughter as pt cannot make a decision, pt also cannot provide subjective symptoms 11. Glaucoma: Continue Ocuvite bid and  Xalatan at bedtime.  12.  Hypoalb- prostat    LOS (Days) 14 A FACE TO FACE EVALUATION WAS PERFORMED  Kyle Richardson 03/13/2018, 8:43 AM

## 2018-03-13 NOTE — Progress Notes (Signed)
Occupational Therapy Session Note  Patient Details  Name: Kyle Richardson MRN: [id number] Date of Birth: 1928-06-15  Today's Date: 03/13/2018 OT Individual Time: 9191-6606 OT Individual Time Calculation (min): 55 min    Short Term Goals: Week 2:  OT Short Term Goal 1 (Week 2): Pt will locate 1/4 grooming items on R side of the sink with mod instructional cues. OT Short Term Goal 2 (Week 2): Pt will complete toilet transfer with max A of 1 caregiver. OT Short Term Goal 3 (Week 2): Pt will tolerate standing at the sink for 3 mins with no more than mod A  in preparation for BADL task  Skilled Therapeutic Interventions/Progress Updates:    Pt greeted seated in TIS wc with family headed out after celebrating pt and his spouse's 65th wedding anniversary. Pt reported need to go to the bathroom. Pt stated he only needed to urinate. Pt completed sit<>stand at the sink with verbal cues and min A. Min/mod A and verbal cues for pt to manage clothing, then OT placed urinal. Pt able to assist with holding urinal while maintaining standing with min A for continent void. Continued to work on standing balance, R attention, and B UE coordination with hand washing task. Verbal cues for sequencing of activity and tactile cues initially to integrate R UE into task. Pt then was able to maintain gaze at midline and appropriately was both hands under water. R visual scanning with max multimodal cues to locate paper towels on R side. OT provided guided A to then reach with R UE to obtain paper towels. [employer] activity focused on R visual scanning, R attention, and functional use of R UE. OT provided guided A, but pt had to initiate use of R UE. Facilitated R pronation and finger isolation, then pt able to push buttons. Pt more aware of R side of board today and initiated looking to the R on several occasions. With repetition, pt with improved accuracy of R hand to push buttons. Pt returned to room and left seated in TIS wc  with nurse tech present to set-up for lunch.    Therapy Documentation Precautions:  Precautions Precautions: Fall Precaution Comments: R hemiplegia, increased R hamstring tone Restrictions Weight Bearing Restrictions: No Pain: Pain Assessment Pain Score: 0-No pain ADL: ADL ADL Comments: Please see functional navigator   See Function Navigator for Current Functional Status.   Therapy/Group: Individual Therapy  Valma Cava 03/13/2018, 12:08 PM

## 2018-03-14 ENCOUNTER — Inpatient Hospital Stay (HOSPITAL_COMMUNITY): Payer: PPO | Admitting: Physical Therapy

## 2018-03-14 ENCOUNTER — Inpatient Hospital Stay (HOSPITAL_COMMUNITY): Payer: PPO | Admitting: Speech Pathology

## 2018-03-14 ENCOUNTER — Inpatient Hospital Stay (HOSPITAL_COMMUNITY): Payer: PPO

## 2018-03-14 LAB — GLUCOSE, CAPILLARY
GLUCOSE-CAPILLARY: 135 mg/dL — AB (ref 65–99)
Glucose-Capillary: 121 mg/dL — ABNORMAL HIGH (ref 65–99)
Glucose-Capillary: 122 mg/dL — ABNORMAL HIGH (ref 65–99)
Glucose-Capillary: 104 mg/dL — ABNORMAL HIGH (ref 65–99)

## 2018-03-14 NOTE — Progress Notes (Signed)
Speech Language Pathology Weekly Progress and Session Note  Patient Details  Name: Kyle Richardson MRN: [id number] Date of Birth: Mar 09, 1928  Beginning of progress report period: Mar 07, 2018 End of progress report period: March 14, 2018  Today's Date: 03/14/2018 SLP Individual Time: 1345-1430 SLP Individual Time Calculation (min): 45 min  Short Term Goals: Week 2: SLP Short Term Goal 1 (Week 2): Patient will attend to right field of enviornment during functional tasks with Mod A multimodal cues.  SLP Short Term Goal 1 - Progress (Week 2): Met SLP Short Term Goal 2 (Week 2): Patient will demonstrate sustained attention to tasks for ~20 minutes with Min A verbal cues for redirection.  SLP Short Term Goal 2 - Progress (Week 2): Met SLP Short Term Goal 3 (Week 2): Patient will name functional items with 90% accuracy and Min A multimodal cues.  SLP Short Term Goal 3 - Progress (Week 2): Met SLP Short Term Goal 4 (Week 2): Patient will verbally respond approrpriately/accuratlely to 75% of open-ended questions with Min A verbal cues.  SLP Short Term Goal 4 - Progress (Week 2): Met SLP Short Term Goal 5 (Week 2): Patient will verbalize at the phrase level during sturctured language tasks with Mod A multimodal cues.  SLP Short Term Goal 5 - Progress (Week 2): Met SLP Short Term Goal 6 (Week 2): Patient will follow basic commands with 90% accuracy and Min A verbal cues.  SLP Short Term Goal 6 - Progress (Week 2): Met    New Short Term Goals: Week 3: SLP Short Term Goal 1 (Week 3): Patient will attend to right field of enviornment during functional tasks with Min A multimodal cues.  SLP Short Term Goal 2 (Week 3): Patient will demonstrate selective attention to tasks for ~15 minutes with Min A verbal cues for redirection in a mildly distracting enviornment.  SLP Short Term Goal 3 (Week 3): Patient will demonstrate functional problem solving for basic and familiar tasks with Mod A verbal cues.  SLP  Short Term Goal 4 (Week 3): Patient will self-monitor and correct verbal errors at the phrase level during strutured tasks with Min A verbal cues.  SLP Short Term Goal 5 (Week 3): Patient will decode at the word level with Max A multimodal cues.  SLP Short Term Goal 6 (Week 3): Patient will follow 2-step commands with Mod A verbal cues.   Weekly Progress Updates: Patient has made functional gains and has met 6 of 6 STG's this reporting period. Currently, patient requires overall Mod A multimodal cues for attention to right field of environment and Min A verbal cues for sustained attention to functional tasks. Patient also demonstrates increased social interaction and engagement with staff throughout sessions.  Patient can express his basic wants/needs at the phrase level but requires Mod A verbal cues to self-monitor and correct errors and to switch between verbal tasks/topics of conversation. However, patient's ability to respond to wh questions has improved along with ability to follow commands. Patient and family education is ongoing. Patient would benefit from continued skilled SLP intervention to maximize his cognitive-linguistic function and overall functional independence prior to discharge.      Intensity: Minumum of 1-2 x/day, 30 to 90 minutes Frequency: 3 to 5 out of 7 days Duration/Length of Stay: 03/28/18 Treatment/Interventions: Cognitive remediation/compensation;Environmental controls;Internal/external aids;Speech/Language facilitation;Therapeutic Activities;Patient/family education;Functional tasks;Cueing hierarchy   Daily Session  Skilled Therapeutic Interventions: Skilled treatment session focused on cognitive-linguistic goals. SLP facilitated session by providing Mod A verbal and visual  cues for problem solving and scanning/attention to right field of environment during a basic calendar making task. Patient unable to decode at the word level despite Max A multimodal cues, suspect due  to a combination of suspected visual/perceptual impairments and language impairments. Patient left upright in wheelchair with family present. Continue with current plan of care.       Function:   Cognition Comprehension Comprehension assist level: Understands basic 75 - 89% of the time/ requires cueing 10 - 24% of the time  Expression   Expression assist level: Expresses basic 50 - 74% of the time/requires cueing 25 - 49% of the time. Needs to repeat parts of sentences.  Social Interaction Social Interaction assist level: Interacts appropriately 50 - 74% of the time - May be physically or verbally inappropriate.  Problem Solving Problem solving assist level: Solves basic 25 - 49% of the time - needs direction more than half the time to initiate, plan or complete simple activities  Memory Memory assist level: Recognizes or recalls 25 - 49% of the time/requires cueing 50 - 75% of the time   Pain Pain Assessment Pain Score: 0-No pain  Therapy/Group: Individual Therapy  [name] 03/14/2018, 3:35 PM

## 2018-03-14 NOTE — Progress Notes (Signed)
Occupational Therapy Weekly Progress Note  Patient Details  Name: Kyle Richardson MRN: [id number] Date of Birth: 09-25-1928  Beginning of progress report period: Feb 28, 2018 End of progress report period: March 14, 2018  Patient has met 3 of 3 short term goals.  Pt is making steady progress towards OT goals at this time. Pt is able to stand with as little as min A, but when adding functional activities, he can need mod A to maintain standing balance. Pt continues to have difficulty with transitional movements, needing mod/max A for stand-pivot transfers. Pt has also demonstrated improved attention to R side this week and is initiating looking to R more often. Continue current POC.  Patient continues to demonstrate the following deficits: muscle weakness, impaired timing and sequencing, abnormal tone, unbalanced muscle activation, motor apraxia, ataxia, decreased coordination and decreased motor planning, decreased midline orientation, decreased attention to right, right side neglect, decreased motor planning and ideational apraxia, decreased initiation, decreased attention, decreased awareness, decreased problem solving, decreased safety awareness, decreased memory and delayed processing and decreased sitting balance, decreased standing balance, decreased postural control, hemiplegia and decreased balance strategies and therefore will continue to benefit from skilled OT intervention to enhance overall performance with BADL and Reduce care partner burden.  Patient progressing toward long term goals..  Continue plan of care.  OT Short Term Goals Week 2:  OT Short Term Goal 1 (Week 2): Pt will locate 1/4 grooming items on R side of the sink with mod instructional cues. OT Short Term Goal 1 - Progress (Week 2): Met OT Short Term Goal 2 (Week 2): Pt will complete toilet transfer with max A of 1 caregiver. OT Short Term Goal 2 - Progress (Week 2): Met OT Short Term Goal 3 (Week 2): Pt will tolerate  standing at the sink for 3 mins with no more than mod A  in preparation for BADL task OT Short Term Goal 3 - Progress (Week 2): Met Week 3:  OT Short Term Goal 1 (Week 3): Pt will complete toilet transfer with Mod A OT Short Term Goal 2 (Week 3): Pt will use R UE functionally to was chest with no more than min guided A OT Short Term Goal 3 (Week 3): Pt will locate 2/4 grooming tasks on R side of the sink with no more than mod instructional cues  Therapy Documentation Precautions:  Precautions Precautions: Fall Precaution Comments: R hemiplegia, increased R hamstring tone Restrictions Weight Bearing Restrictions: No   See Function Navigator for Current Functional Status.   Therapy/Group: Individual Therapy  Valma Cava 03/14/2018, 7:04 AM

## 2018-03-14 NOTE — Progress Notes (Signed)
Physical Therapy Weekly Progress Note  Patient Details  Name: Kyle Richardson MRN: [id number] Date of Birth: [birth year]  Beginning of progress report period: Mar 07, 2018 End of progress report period: March 14, 2018  Today's Date: 03/14/2018 PT Individual Time: 0900-1000 AND 1625-1640 PT Individual Time Calculation (min): 60 min AND 15 min  Patient has met 3 of 4 short term goals. Pt is progressing well with therapies, performing all mobility w/ mod-max assist x1 helper including bed mobility, transfers, and gait. He has briefly initiated self-propelling manual w/c, however remains limited in OOB tolerance w/o TIS at this time. Will continue to promote use of manual w/c for increased independence w/ locomotion. His gait has greatly improved as he only requires manual assistance for lateral weight shifting and RW management, he is able to initiate hip and knee flexion w/ steps and step placement w/ verbal cues. Family continues to be supportive and involved in therapies providing occasional hands-on assist as needed, but have not been formally trained.   Patient continues to demonstrate the following deficits muscle weakness and muscle joint tightness, decreased cardiorespiratoy endurance, impaired timing and sequencing, abnormal tone, unbalanced muscle activation, motor apraxia, ataxia, decreased coordination and decreased motor planning, decreased visual perceptual skills and decreased visual motor skills, decreased midline orientation and decreased attention to right, decreased initiation, decreased attention, decreased awareness, decreased problem solving, decreased safety awareness, decreased memory and delayed processing and decreased sitting balance, decreased standing balance, decreased postural control, hemiplegia and decreased balance strategies and therefore will continue to benefit from skilled PT intervention to increase functional independence with mobility.  Patient progressing toward long  term goals..  Continue plan of care.  PT Short Term Goals Week 2:  PT Short Term Goal 1 (Week 2): Pt will ambulate 30' w/ max assist x2 using LRAD PT Short Term Goal 1 - Progress (Week 2): Met PT Short Term Goal 2 (Week 2): Pt will attend to R environment during functional mobility w/ min cues PT Short Term Goal 2 - Progress (Week 2): Met PT Short Term Goal 3 (Week 2): Pt will initiate self-propelling manual w/c PT Short Term Goal 3 - Progress (Week 2): Met PT Short Term Goal 4 (Week 2): Pt will transfer bed<>chair w/ mod assist 50% of the time PT Short Term Goal 4 - Progress (Week 2): Progressing toward goal Week 3:  PT Short Term Goal 1 (Week 3): Pt will perform bed<>chair transfer w/ mod assist consistently PT Short Term Goal 2 (Week 3): Pt will self-propel manual w/c 25' w/ supervision PT Short Term Goal 3 (Week 3): Pt will initiate stair training  Skilled Therapeutic Interventions/Progress Updates:   Session 1:  Pt in TIS and agreeable to therapy, no c/o pain this session. Session focused on gait training and RLE NMR during gait. Ambulated in gym 47' x2 w/ RW, mod assist overall. 2nd helper for safety, but no hands on needed. Manual assist w/ RW management, lateral weight shifting, and verbal cues for R step placement. Pt able to sense when R foot is dragging and able to correct w/o cues. No w/c follow this session. Performed lite gait over treadmill @ 0.6 mph, 96' and 168' w/ seated rest break. Emphasis on reciprocal step placement, more symmetrical gait pattern, and lateral weight shifting. Max manual facilitation of RLE knee flexion and step placement during first 20-30', then pt able to perform step placement w/ manual assist only to prevent scissoring. Min manual assist for lateral weight shifting. Will assess for  carryover to RW during session this afternoon. Returned to room and ended session in TIS, call bell within reach and all needs met. Tilted back for comfort.   Session 2:  Pt  in TIS and agreeable to therapy, reports brief was soiled w/ urine. He states he knew he needed to go, but didn't know what to do or how to express his needs w/ no one present. Pt also unable to state how to call nurses station for assistance despite max cues. Made NT aware of incontinence, pt was unable to continue holding. Pt maintain static standing w/ close supervision to min guard while therapist managed LE garments, changed brief, and performed pericare. Attempted to work on gait, however pt required increased assistance this session compared to this morning. Pt stated "I don't know what's wrong with me". Suspect increased assistance needed 2/2 fatigue. Doristine Bosworth arrived to room at this time, requesting some time w/ pt. Missed 15 min of skilled PT 2/2 fatigue and visitor. Ended session in TIS, call bell within reach and all needs met.   Therapy Documentation Precautions:  Precautions Precautions: Fall Precaution Comments: R hemiplegia, increased R hamstring tone Restrictions Weight Bearing Restrictions: No Vital Signs: Therapy Vitals Pulse Rate: (!) 58 BP: 124/67  See Function Navigator for Current Functional Status.  Therapy/Group: Individual Therapy  Rosalynd Mcwright K Arnette 03/14/2018, 10:55 AM

## 2018-03-14 NOTE — Progress Notes (Signed)
Subjective/Complaints: Remains aphasic, alert watching TV- "that's Trump"  ROS- unreliable due to aphasia, denies pain  Objective: Vital Signs: Blood pressure (!) 151/72, pulse (!) 52, temperature 98.6 F (37 C), resp. rate 18, height 5\' 8"  (1.727 m), weight 73.2 kg (161 lb 6 oz), SpO2 (!) 89 %. No results found. Results for orders placed or performed during the hospital encounter of 02/27/18 (from the past 72 hour(s))  Glucose, capillary     Status: Abnormal   Collection Time: 03/11/18 12:07 PM  Result Value Ref Range   Glucose-Capillary 151 (H) 65 - 99 mg/dL  Glucose, capillary     Status: Abnormal   Collection Time: 03/11/18  4:40 PM  Result Value Ref Range   Glucose-Capillary 124 (H) 65 - 99 mg/dL  Glucose, capillary     Status: Abnormal   Collection Time: 03/11/18  9:30 PM  Result Value Ref Range   Glucose-Capillary 149 (H) 65 - 99 mg/dL  Glucose, capillary     Status: Abnormal   Collection Time: 03/12/18  6:24 AM  Result Value Ref Range   Glucose-Capillary 142 (H) 65 - 99 mg/dL  Glucose, capillary     Status: Abnormal   Collection Time: 03/12/18 12:05 PM  Result Value Ref Range   Glucose-Capillary 122 (H) 65 - 99 mg/dL  Glucose, capillary     Status: Abnormal   Collection Time: 03/12/18  4:53 PM  Result Value Ref Range   Glucose-Capillary 188 (H) 65 - 99 mg/dL  Glucose, capillary     Status: Abnormal   Collection Time: 03/12/18  9:48 PM  Result Value Ref Range   Glucose-Capillary 147 (H) 65 - 99 mg/dL  Glucose, capillary     Status: Abnormal   Collection Time: 03/13/18  6:16 AM  Result Value Ref Range   Glucose-Capillary 136 (H) 65 - 99 mg/dL  Glucose, capillary     Status: Abnormal   Collection Time: 03/13/18 11:36 AM  Result Value Ref Range   Glucose-Capillary 165 (H) 65 - 99 mg/dL  Glucose, capillary     Status: Abnormal   Collection Time: 03/13/18  4:40 PM  Result Value Ref Range   Glucose-Capillary 126 (H) 65 - 99 mg/dL  Glucose, capillary     Status:  Abnormal   Collection Time: 03/13/18  9:51 PM  Result Value Ref Range   Glucose-Capillary 126 (H) 65 - 99 mg/dL  Glucose, capillary     Status: Abnormal   Collection Time: 03/14/18  6:32 AM  Result Value Ref Range   Glucose-Capillary 121 (H) 65 - 99 mg/dL     Constitutional: No distress . Vital signs reviewed. HENT: Normocephalic.  Atraumatic. Eyes: EOMI. No discharge. Cardiovascular: Irregularly irregular. No JVD. Respiratory: CTA Bilaterally. Normal effort. GI: BS +. Non-distended. Musc: No edema or tenderness in extremities. Neuro: Alert. Motor: Limited due to participation and apraxia ?RUE: 0/5 proximal distal ?RLE: Hip flexion, knee extension, ADF 4/5 ?LLE: 4+/5 proximal to distal General no acute distress.  Vital signs reviewed.   Assessment/Plan: 1. Functional deficits secondary to left PCA infarct with right hemiparesis, receptive aphasia which require 3+ hours per day of interdisciplinary therapy in a comprehensive inpatient rehab setting. Physiatrist is providing close team supervision and 24 hour management of active medical problems listed below. Physiatrist and rehab team continue to assess barriers to discharge/monitor patient progress toward functional and medical goals. FIM: Function - Bathing Position: Wheelchair/chair at sink Body parts bathed by patient: Chest, Abdomen, Front perineal area, Right arm, Left arm,  Right upper leg, Left upper leg, Right lower leg, Left lower leg Body parts bathed by helper: Buttocks Assist Level: Touching or steadying assistance(Pt > 75%)  Function- Upper Body Dressing/Undressing What is the patient wearing?: Pull over shirt/dress Pull over shirt/dress - Perfomed by patient: Thread/unthread left sleeve Pull over shirt/dress - Perfomed by helper: Put head through opening, Pull shirt over trunk, Thread/unthread right sleeve Assist Level: Touching or steadying assistance(Pt > 75%) Function - Lower Body Dressing/Undressing What is  the patient wearing?: Pants Position: Wheelchair/chair at sink Underwear - Performed by helper: Thread/unthread right underwear leg, Thread/unthread left underwear leg, Pull underwear up/down Pants- Performed by patient: Thread/unthread left pants leg Pants- Performed by helper: Pull pants up/down, Thread/unthread right pants leg Non-skid slipper socks- Performed by helper: Don/doff right sock, Don/doff left sock Assist for footwear: Dependant Assist for lower body dressing: Touching or steadying assistance (Pt > 75%)  Function - Toileting Toileting activity did not occur: No continent bowel/bladder event Toileting steps completed by helper: Adjust clothing prior to toileting, Performs perineal hygiene, Adjust clothing after toileting Assist level: Two helpers  Function - Air cabin crew transfer activity did not occur: Safety/medical concerns Toilet transfer assistive device: Facilities manager lift: Stedy Assist level to toilet: 2 helpers Assist level from toilet: 2 helpers  Function - Chair/bed transfer Chair/bed transfer method: Stand pivot Chair/bed transfer assist level: Moderate assist (Pt 50 - 74%/lift or lower) Chair/bed transfer assistive device: Armrests, Walker Mechanical lift: Stedy Chair/bed transfer details: Manual facilitation for placement, Manual facilitation for weight shifting, Verbal cues for sequencing, Verbal cues for technique  Function - Locomotion: Wheelchair Will patient use wheelchair at discharge?: Yes Type: Manual Wheelchair activity did not occur: Safety/medical concerns Max wheelchair distance: 25' Assist Level: Maximal assistance (Pt 25 - 49%) Wheel 50 feet with 2 turns activity did not occur: Safety/medical concerns Wheel 150 feet activity did not occur: Safety/medical concerns Turns around,maneuvers to table,bed, and toilet,negotiates 3% grade,maneuvers on rugs and over doorsills: No Function - Locomotion: Ambulation Ambulation  activity did not occur: Safety/medical concerns Assistive device: Walker-rolling, Orthosis Max distance: 60 ft Assist level: 2 helpers(pt mod assist) Walk 10 feet activity did not occur: Safety/medical concerns Assist level: 2 helpers Walk 50 feet with 2 turns activity did not occur: Safety/medical concerns Assist level: 2 helpers Walk 150 feet activity did not occur: Safety/medical concerns Walk 10 feet on uneven surfaces activity did not occur: Safety/medical concerns  Function - Comprehension Comprehension: Auditory Comprehension assistive device: Hearing aids Comprehension assist level: Understands basic 75 - 89% of the time/ requires cueing 10 - 24% of the time  Function - Expression Expression: Verbal Expression assist level: Expresses basic 50 - 74% of the time/requires cueing 25 - 49% of the time. Needs to repeat parts of sentences.  Function - Social Interaction Social Interaction assist level: Interacts appropriately 50 - 74% of the time - May be physically or verbally inappropriate.  Function - Problem Solving Problem solving assist level: Solves basic 25 - 49% of the time - needs direction more than half the time to initiate, plan or complete simple activities  Function - Memory Memory assist level: Recognizes or recalls 25 - 49% of the time/requires cueing 50 - 75% of the time Patient normally able to recall (first 3 days only): None of the above   Medical Problem List and Plan: 1.  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  Cont CIR-PT, OT, SLP2.  DVT Prophylaxis/Anticoagulation: Pharmaceutical: Other (comment)--Eliquis  this will serve as DVT prophylaxis as well 3. Pain Management: tylenol prn.  4. Mood: Team to provide ego support to help with frustration.  LCSW to follow for evaluation and support when appropriate.  On Prozac 5. Neuropsych: This patient is not capable of making decisions on his own behalf. 6. Skin/Wound Care:  routine pressure relief measures.  7. Fluids/Electrolytes/Nutrition: Monitor I/Os. Added full supervision at meals for safety., BMET within acceptable range on 5/27 and 6/3, I 442ml recorded with meals yesterday8. PAF/CAD/HTN: Monitor HR bid. ON metoprolol and Lipitor.  Vitals:   03/14/18 0402 03/14/18 0550  BP: (!) 149/51 (!) 151/72  Pulse: (!) 48 (!) 52  Resp: 17 18  Temp: 98.6 F (37 C)   SpO2: 97% (!) 89%   low dose amlodipine started 5/28 labile on 6/5, will consider further medication adjustments if persistently elevated 9. T2DM: Hgb A1C- 7.1.  Monitor BS ac/hs and use SSI for elevated BS. Metformin d/ced due to GI Side effects CBG (last 3)  Recent Labs    03/13/18 1640 03/13/18 2151 03/14/18 0632  GLUCAP 126* 126* 121*   Controlled 6/6, just on SSI 10. Myasthenia Gravis with intermittent diplopia: On mestonin 30 mg bid--to be increased to 60 mg bid per discharge summary, daughter doesn't want him to have this, had EMG confirmation. Monitor for fatiguability , ocular symptoms, if this is limiting therapy will revisit this with daughter as pt cannot make a decision, pt also cannot provide subjective symptoms 11. Glaucoma: Continue Ocuvite bid and  Xalatan at bedtime.  12.  Hypoalb- prostat    LOS (Days) 15 A FACE TO FACE EVALUATION WAS PERFORMED  Charlett Blake 03/14/2018, 8:49 AM

## 2018-03-14 NOTE — Progress Notes (Signed)
Occupational Therapy Session Note  Patient Details  Name: Kyle Richardson MRN: [id number] Date of Birth: 04-08-1928  Today's Date: 03/14/2018 OT Individual Time: 1100-1155 OT Individual Time Calculation (min): 55 min    Short Term Goals: Week 3:  OT Short Term Goal 1 (Week 3): Pt will complete toilet transfer with Mod A OT Short Term Goal 2 (Week 3): Pt will use R UE functionally to was chest with no more than min guided A OT Short Term Goal 3 (Week 3): Pt will locate 2/4 grooming tasks on R side of the sink with no more than mod instructional cues  Skilled Therapeutic Interventions/Progress Updates:    OT intervention with focus on bathing/dressing with sit<>stand from w/c at sink.  Focus on task initiation, RUE functional use, sequencing, sit<>stand, standing balance, and safety awareness to increase independence with BADLs. Pt required min verbal cues to initiate tasks and sequence from one task to another.  Pt required min A for sit<>stand and standing balance at sink.  Pt requires more than a reasonable amount of time to complete all tasks.  Pt remained seated in w/c with QRB in place and all needs within reach.   Therapy Documentation Precautions:  Precautions Precautions: Fall Precaution Comments: R hemiplegia, increased R hamstring tone Restrictions Weight Bearing Restrictions: No Pain: Pain Assessment Pain Score: 0-No pain  See Function Navigator for Current Functional Status.   Therapy/Group: Individual Therapy  Leroy Libman 03/14/2018, 12:50 PM

## 2018-03-15 ENCOUNTER — Inpatient Hospital Stay (HOSPITAL_COMMUNITY): Payer: PPO | Admitting: Speech Pathology

## 2018-03-15 ENCOUNTER — Inpatient Hospital Stay (HOSPITAL_COMMUNITY): Payer: PPO | Admitting: Occupational Therapy

## 2018-03-15 ENCOUNTER — Inpatient Hospital Stay (HOSPITAL_COMMUNITY): Payer: PPO | Admitting: Physical Therapy

## 2018-03-15 LAB — GLUCOSE, CAPILLARY
GLUCOSE-CAPILLARY: 119 mg/dL — AB (ref 65–99)
GLUCOSE-CAPILLARY: 133 mg/dL — AB (ref 65–99)
GLUCOSE-CAPILLARY: 117 mg/dL — AB (ref 65–99)
GLUCOSE-CAPILLARY: 157 mg/dL — AB (ref 65–99)

## 2018-03-15 NOTE — Progress Notes (Signed)
Social Work Patient ID: Kyle Richardson, male   DOB: 04-09-1928, 84 y.o.   MRN: [id number]   CSW met with pt after team conference on 03-13-18 to give him an update and then spoke with his dtr on 03-14-18 to update her.  She is pleased with pt's progress and is glad d/c date remains 03-28-18.  She has identified 2 caregivers for pt and his wife and son and dtr would like for them to come for family education prior to d/c.  She will try to come, as well.  Discussed DME and follow up therapies and CSW assured her that CSW will arrange these.  She was appreciative.  Plan to talk next week to arrange family education and confirm d/c details.  CSW remains available as needed.

## 2018-03-15 NOTE — Patient Care Conference (Signed)
Inpatient RehabilitationTeam Conference and Plan of Care Update Date: 03/13/2018   Time: 11:25 AM    Patient Name: Kyle Richardson      Medical Record Number: [id number]  Date of Birth: [birth year] Sex: Male         Room/Bed: 4W24C/4W24C-01 Payor Info: Payor: [employer] / Plan: [employer] / Product Type: *No Product type* /    Admitting Diagnosis: L CVA  Admit Date/Time:  02/27/2018  3:28 PM Admission Comments: No comment available   Primary Diagnosis:  Acute ischemic left PCA stroke (HCC) Principal Problem: Acute ischemic left PCA stroke [facility])  Patient Active Problem List   Diagnosis Date Noted  . Labile blood pressure   . Acute ischemic left PCA stroke ([place]) 02/27/2018  . Diabetes mellitus type 2 in nonobese (HCC)   . Glaucoma   . MG, ocular (myasthenia gravis) ([place])   . CVA (cerebral vascular accident) ([place]) 02/24/2018  . Right homonymous hemianopsia 02/23/2018  . Unresponsive episode 02/22/2018  . UTI (urinary tract infection) 01/28/2018  . Abnormal chest CT 09/20/2017  . Double vision 01/21/2017  . Ascending aortic aneurysm ([place]) 12/22/2016  . Blood-tinged sputum 10/23/2016  . Cough 10/23/2016  . PAF (paroxysmal atrial fibrillation) ([place]) 09/14/2016  . Health care maintenance 12/26/2014  . Stress 07/05/2014  . Migraine 05/10/2013  . Type 2 diabetes mellitus with complication, without long-term current use of insulin ([place]) 08/20/2012  . Hypertension 08/20/2012  . Hypercholesterolemia 08/20/2012  . CAD (coronary artery disease) 08/20/2012    Expected Discharge Date: Expected Discharge Date: 03/28/18  Team Members Present: Physician leading conference: Dr. Alysia Penna Social Worker Present: Alfonse Alpers, LCSW Nurse Present: Other (comment)(Latoya Drue Flirt, LPN) PT Present: Michaelene Song, PT OT Present: Cherylynn Ridges, OT SLP Present: Weston Anna, SLP PPS Coordinator present : Daiva Nakayama, RN, CRRN     Current Status/Progress Goal Weekly Team  Focus  Medical   speech/language improving, BP still labile  maintain medical stability , reduce burden of care  Right sided attention, language   Bowel/Bladder   INC B&B LBM 03/14/18  Continent of bowel and bladder with mod assist  Timed toileting q3hr   Swallow/Nutrition/ Hydration             ADL's   Mod/max A transfers, can be min A to stand at times, mod A overall for BADLs  Min A  R attention, R NMR, R visual scanning, apraxia, transfers, sit<>stands, modified bathing/dressing   Mobility   mod-max assist x1 for transfers and sit<>stands, gait mod-max assist 30' @ rail  min assist  gait and sit<>stands w/ RW, RLE NMR   Communication   Mod A for verbal expression, supervision-Min A for comprehension  Overall Min A  verbal expression at phrase level, following mildly complex commands    Safety/Cognition/ Behavioral Observations  Mod-Max A  Min A  visual scanning to right, attention, problem solving and recall    Pain   Denies pain  Pain <2 out of 10 on a scale from 1-10  Assess qshift and PRN   Skin   n/a  Free from infection/skin breakdown with min assist  Assess Qshift and PRN    Rehab Goals Patient on target to meet rehab goals: Yes Rehab Goals Revised: none *See Care Plan and progress notes for long and short-term goals.     Barriers to Discharge  Current Status/Progress Possible Resolutions Date Resolved   Physician    Medical stability;Other (comments)  severe sensory deficit on Right side  progressing toward D/C  Cont rehab      Nursing                  PT  Decreased caregiver support;Home environment access/layout;Inaccessible home environment  Pt is caregiver for wife and son, steps to enter home  Daughter and son-in-law working on arranging 24/7 assist at home between themselves and hired caregivers           OT                  SLP                SW                Discharge Planning/Teaching Needs:  Pt's family intends to take pt home and family/paid  caregivers will provide 24/7 minimal assistance.  Family/caregivers would come in prior to discharge to receive family education in order to care for pt properly.   Team Discussion:  Pt's speech is improving and he is able to follow commands better, per Dr. Letta Pate.  He feels his apraxia is improving also and neuropsychologist is seeing pt.  Pt can be continent if someone is with him, but otherwise doesn't call for help and is incontinent. Nursing to try timed toileting.  Pt is doing well with ADLs and did one tx min A, but usually needs mod to max A.  Pt's right attention is better.  Pt is getting better daily with PT and can get out of bed with supervision and extra time due to motor planning deficits.  Pt walked 100' with rolling walker and 2 people mod A for his safety.  Pt's language and comprehension are better, but he still has trouble switching topics or tasks and "gets stuck."  Overall, though, doing better with ST, too.  Revisions to Treatment Plan:  none    Continued Need for Acute Rehabilitation Level of Care: The patient requires daily medical management by a physician with specialized training in physical medicine and rehabilitation for the following conditions: Daily analysis of laboratory values and/or radiology reports with any subsequent need for medication adjustment of medical intervention for : Neurological problems  Tayson Schnelle, [name] 03/15/2018, 10:26 PM

## 2018-03-15 NOTE — Progress Notes (Signed)
Occupational Therapy Session Note  Patient Details  Name: Kyle Richardson MRN: [id number] Date of Birth: [birth year]  Today's Date: 03/15/2018 OT Individual Time: 1102-1202 OT Individual Time Calculation (min): 60 min   Short Term Goals: Week 3:  OT Short Term Goal 1 (Week 3): Pt will complete toilet transfer with Mod A OT Short Term Goal 2 (Week 3): Pt will use R UE functionally to was chest with no more than min guided A OT Short Term Goal 3 (Week 3): Pt will locate 2/4 grooming tasks on R side of the sink with no more than mod instructional cues  Skilled Therapeutic Interventions/Progress Updates:    Pt greeted sitting in regular wc at the nurses station and agreeable to OT. Pt returned to the room for bathing/dressing session. Stedy used to transfer pt into shower with overall mod A sit<>stand. Incorporated functional use of R UE with some improved smoothness and accuracy of R UE when washing body parts. Also integrate R attention and R visual scanning with all BADL tasks. Dressing completed wc at the sink with improved attention to R side of body. Pt left seated in wc at end of session with safety belt on.   Therapy Documentation Precautions:  Precautions Precautions: Fall Precaution Comments: R hemiplegia, increased R hamstring tone Restrictions Weight Bearing Restrictions: No Pain: Pain Assessment Pain Scale: 0-10 Pain Score: 0-No pain ADL: ADL ADL Comments: Please see functional navigator  See Function Navigator for Current Functional Status.   Therapy/Group: Individual Therapy  Valma Cava 03/15/2018, 11:51 AM

## 2018-03-15 NOTE — Progress Notes (Signed)
Physical Therapy Session Note  Patient Details  Name: Kyle Richardson MRN: [id number] Date of Birth: 07/27/1928  Today's Date: 03/15/2018 PT Individual Time: 0900-0959 PT Individual Time Calculation (min): 59 min   Short Term Goals: Week 3:  PT Short Term Goal 1 (Week 3): Pt will perform bed<>chair transfer w/ mod assist consistently PT Short Term Goal 2 (Week 3): Pt will self-propel manual w/c 25' w/ supervision PT Short Term Goal 3 (Week 3): Pt will initiate stair training  Skilled Therapeutic Interventions/Progress Updates:   Pt in supine and agreeable to therapy, no c/o pain. Session focused on functional mobility including w/c locomotion and transfers. Transferred to EOB w/ min assist and donned shoes w/ total assist for time management. Transferred to manual w/c via squat pivot w/ mod-max assist. Pt self-propelled w/c to therapy gym, occasional min assist at turns and max verbal, visual, and tactile cues for technique. However pt able to self-propel via L hemi technique w/ increased coordination and independence this session compared to last time manual w/c locomotion was attempted. Performed blocked practice of 10-12 squat pivot and lateral scoot transfers, mat<>w/c, in both directions w/ mod assist overall. Emphasis on achieving transfer in 1-2 scoots vs 4-5 as he was doing before. Brief rest in between transfers, also practiced lateral scooting at the mat in both directions. No family members present at end of session, ended session sitting up in manual w/c at nurses station to make sure pt is safe sitting in manual w/c in between sessions, RN made aware. All needs met and quick release belt donned.   Therapy Documentation Precautions:  Precautions Precautions: Fall Precaution Comments: R hemiplegia, increased R hamstring tone Restrictions Weight Bearing Restrictions: No Pain: Pain Assessment Pain Scale: 0-10 Pain Score: 0-No pain  See Function Navigator for Current Functional  Status.   Therapy/Group: Individual Therapy  Angelo Caroll K Arnette 03/15/2018, 9:59 AM

## 2018-03-15 NOTE — Progress Notes (Signed)
Speech Language Pathology Daily Session Notes  Patient Details  Name: Kyle Richardson MRN: [id number] Date of Birth: 22-Sep-1928  Today's Date: 03/15/2018   Session 1: SLP Individual Time: 0347-4259 SLP Individual Time Calculation (min): 30 min  Session 2: SLP individual Time: 1345-1430 SLP Individual Time Calculation (min): 55 min  Short Term Goals: Week 3: SLP Short Term Goal 1 (Week 3): Patient will attend to right field of enviornment during functional tasks with Min A multimodal cues.  SLP Short Term Goal 2 (Week 3): Patient will demonstrate selective attention to tasks for ~15 minutes with Min A verbal cues for redirection in a mildly distracting enviornment.  SLP Short Term Goal 3 (Week 3): Patient will demonstrate functional problem solving for basic and familiar tasks with Mod A verbal cues.  SLP Short Term Goal 4 (Week 3): Patient will self-monitor and correct verbal errors at the phrase level during strutured tasks with Min A verbal cues.  SLP Short Term Goal 5 (Week 3): Patient will decode at the word level with Max A multimodal cues.  SLP Short Term Goal 6 (Week 3): Patient will follow 2-step commands with Mod A verbal cues.   Skilled Therapeutic Interventions:  Session 1: Skilled treatment session focused on cognitive goals. SLP facilitated session by providing Min A verbal cues for problem solving and Min-Mod A verbal and visual cues for visual scanning to the right during self-feeding with breakfast. Patient demonstrated increased emergent awareness of visual-perceptual deficits and perseverated on how his vision is currently impacting his function. Patient was Mod I for word-finding without verbal errors noted this session. Patient left upright in bed with alarm on and all needs within reach. Continue with current plan of care.   Session 2: Skilled treatment session focused on cognitive-linguistic tasks, more specifically decoding at the word level. SLP facilitated session by  providing Max-Total A for patient to identify individual letters or decode simple CVC words. However, when given a specific CVC word, patient could independently spell and write the word appropriately (with some cues needed due to apraxia while using his non-dominant LUE). Patient could also encode at the word level when a specific CVC word is spelled aloud to him. Suspect patient's function is impacted by significant visual deficits, however, difficult to truly discern due to moderate cognitive-linguistic impairments, therefore, continued diagnostic treatment is needed. Patient left upright in wheelchair with quick release belt in place and all needs within reach. Continue with current plan of care.   Function:  Eating Eating   Modified Consistency Diet: No Eating Assist Level: Set up assist for;Supervision or verbal cues   Eating Set Up Assist For: Opening containers;Cutting food       Cognition Comprehension Comprehension assist level: Understands basic 75 - 89% of the time/ requires cueing 10 - 24% of the time  Expression   Expression assist level: Expresses basic 50 - 74% of the time/requires cueing 25 - 49% of the time. Needs to repeat parts of sentences.  Social Interaction Social Interaction assist level: Interacts appropriately 50 - 74% of the time - May be physically or verbally inappropriate.  Problem Solving Problem solving assist level: Solves basic 25 - 49% of the time - needs direction more than half the time to initiate, plan or complete simple activities  Memory Memory assist level: Recognizes or recalls 25 - 49% of the time/requires cueing 50 - 75% of the time    Pain No/Denies Pain   Therapy/Group: Individual Therapy  Teddy Pena 03/15/2018, 12:50  PM   

## 2018-03-15 NOTE — Sleep Study (Signed)
Pt slept all night.

## 2018-03-15 NOTE — Progress Notes (Signed)
Subjective/Complaints: Remains aphasic,  ROS- unreliable due to aphasia, denies pain  Objective: Vital Signs: Blood pressure (!) 143/58, pulse (!) 55, temperature (!) 97.4 F (36.3 C), temperature source Oral, resp. rate 18, height 5\' 8"  (1.727 m), weight 73.2 kg (161 lb 6 oz), SpO2 96 %. No results found. Results for orders placed or performed during the hospital encounter of 02/27/18 (from the past 72 hour(s))  Glucose, capillary     Status: Abnormal   Collection Time: 03/12/18 12:05 PM  Result Value Ref Range   Glucose-Capillary 122 (H) 65 - 99 mg/dL  Glucose, capillary     Status: Abnormal   Collection Time: 03/12/18  4:53 PM  Result Value Ref Range   Glucose-Capillary 188 (H) 65 - 99 mg/dL  Glucose, capillary     Status: Abnormal   Collection Time: 03/12/18  9:48 PM  Result Value Ref Range   Glucose-Capillary 147 (H) 65 - 99 mg/dL  Glucose, capillary     Status: Abnormal   Collection Time: 03/13/18  6:16 AM  Result Value Ref Range   Glucose-Capillary 136 (H) 65 - 99 mg/dL  Glucose, capillary     Status: Abnormal   Collection Time: 03/13/18 11:36 AM  Result Value Ref Range   Glucose-Capillary 165 (H) 65 - 99 mg/dL  Glucose, capillary     Status: Abnormal   Collection Time: 03/13/18  4:40 PM  Result Value Ref Range   Glucose-Capillary 126 (H) 65 - 99 mg/dL  Glucose, capillary     Status: Abnormal   Collection Time: 03/13/18  9:51 PM  Result Value Ref Range   Glucose-Capillary 126 (H) 65 - 99 mg/dL  Glucose, capillary     Status: Abnormal   Collection Time: 03/14/18  6:32 AM  Result Value Ref Range   Glucose-Capillary 121 (H) 65 - 99 mg/dL  Glucose, capillary     Status: Abnormal   Collection Time: 03/14/18 11:32 AM  Result Value Ref Range   Glucose-Capillary 122 (H) 65 - 99 mg/dL  Glucose, capillary     Status: Abnormal   Collection Time: 03/14/18  4:31 PM  Result Value Ref Range   Glucose-Capillary 104 (H) 65 - 99 mg/dL  Glucose, capillary     Status: Abnormal    Collection Time: 03/14/18  8:53 PM  Result Value Ref Range   Glucose-Capillary 135 (H) 65 - 99 mg/dL  Glucose, capillary     Status: Abnormal   Collection Time: 03/15/18  7:03 AM  Result Value Ref Range   Glucose-Capillary 119 (H) 65 - 99 mg/dL     Constitutional: No distress . Vital signs reviewed. HENT: Normocephalic.  Atraumatic. Eyes: EOMI. No discharge. Cardiovascular: Irregularly irregular. No JVD. Respiratory: CTA Bilaterally. Normal effort. GI: BS +. Non-distended. Musc: No edema or tenderness in extremities. Neuro: Alert. Motor: Limited due to participation and apraxia ?RUE: 0/5 proximal distal ?RLE: Hip flexion, knee extension, ADF 4/5 ?LLE: 4+/5 proximal to distal General no acute distress.  Vital signs reviewed.   Assessment/Plan: 1. Functional deficits secondary to left PCA infarct with right hemiparesis, receptive aphasia which require 3+ hours per day of interdisciplinary therapy in a comprehensive inpatient rehab setting. Physiatrist is providing close team supervision and 24 hour management of active medical problems listed below. Physiatrist and rehab team continue to assess barriers to discharge/monitor patient progress toward functional and medical goals. FIM: Function - Bathing Position: Wheelchair/chair at sink Body parts bathed by patient: Chest, Abdomen, Front perineal area, Right arm, Left arm, Right upper  leg, Left upper leg, Right lower leg, Left lower leg, Buttocks Body parts bathed by helper: Buttocks Assist Level: Touching or steadying assistance(Pt > 75%)  Function- Upper Body Dressing/Undressing What is the patient wearing?: Pull over shirt/dress Pull over shirt/dress - Perfomed by patient: Thread/unthread right sleeve, Put head through opening Pull over shirt/dress - Perfomed by helper: Thread/unthread left sleeve, Pull shirt over trunk Assist Level: Touching or steadying assistance(Pt > 75%) Function - Lower Body Dressing/Undressing What  is the patient wearing?: Pants, Socks, Shoes Position: Wheelchair/chair at sink Underwear - Performed by helper: Thread/unthread right underwear leg, Thread/unthread left underwear leg, Pull underwear up/down Pants- Performed by patient: Thread/unthread right pants leg, Thread/unthread left pants leg, Pull pants up/down Pants- Performed by helper: Pull pants up/down, Thread/unthread right pants leg Non-skid slipper socks- Performed by helper: Don/doff right sock, Don/doff left sock Socks - Performed by helper: Don/doff right sock, Don/doff left sock Shoes - Performed by helper: Don/doff right shoe, Don/doff left shoe, Fasten right, Fasten left Assist for footwear: Dependant Assist for lower body dressing: Touching or steadying assistance (Pt > 75%)  Function - Toileting Toileting activity did not occur: No continent bowel/bladder event Toileting steps completed by helper: Adjust clothing prior to toileting, Performs perineal hygiene, Adjust clothing after toileting Assist level: Two helpers  Function - Air cabin crew transfer activity did not occur: Safety/medical concerns Toilet transfer assistive device: Facilities manager lift: Stedy Assist level to toilet: 2 helpers Assist level from toilet: 2 helpers  Function - Chair/bed transfer Chair/bed transfer method: Stand pivot Chair/bed transfer assist level: Moderate assist (Pt 50 - 74%/lift or lower) Chair/bed transfer assistive device: Armrests, Walker Mechanical lift: Stedy Chair/bed transfer details: Manual facilitation for placement, Manual facilitation for weight shifting, Verbal cues for sequencing, Verbal cues for technique  Function - Locomotion: Wheelchair Will patient use wheelchair at discharge?: Yes Type: Manual Wheelchair activity did not occur: Safety/medical concerns Max wheelchair distance: 25' Assist Level: Maximal assistance (Pt 25 - 49%) Wheel 50 feet with 2 turns activity did not occur:  Safety/medical concerns Wheel 150 feet activity did not occur: Safety/medical concerns Turns around,maneuvers to table,bed, and toilet,negotiates 3% grade,maneuvers on rugs and over doorsills: No Function - Locomotion: Ambulation Ambulation activity did not occur: Safety/medical concerns Assistive device: Walker-rolling, Orthosis Max distance: 60 ft Assist level: 2 helpers(pt mod assist) Walk 10 feet activity did not occur: Safety/medical concerns Assist level: 2 helpers Walk 50 feet with 2 turns activity did not occur: Safety/medical concerns Assist level: 2 helpers Walk 150 feet activity did not occur: Safety/medical concerns Walk 10 feet on uneven surfaces activity did not occur: Safety/medical concerns  Function - Comprehension Comprehension: Auditory Comprehension assistive device: Hearing aids Comprehension assist level: Understands basic 75 - 89% of the time/ requires cueing 10 - 24% of the time  Function - Expression Expression: Verbal Expression assist level: Expresses basic 50 - 74% of the time/requires cueing 25 - 49% of the time. Needs to repeat parts of sentences.  Function - Social Interaction Social Interaction assist level: Interacts appropriately 50 - 74% of the time - May be physically or verbally inappropriate.  Function - Problem Solving Problem solving assist level: Solves basic 25 - 49% of the time - needs direction more than half the time to initiate, plan or complete simple activities  Function - Memory Memory assist level: Recognizes or recalls 25 - 49% of the time/requires cueing 50 - 75% of the time Patient normally able to recall (first 3 days only): None  of the above   Medical Problem List and Plan: 1.  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  Cont CIR-PT, OT, SLP2.  DVT Prophylaxis/Anticoagulation: Pharmaceutical: Other (comment)--Eliquis this will serve as DVT prophylaxis as well 3. Pain Management: tylenol  prn.  4. Mood: Team to provide ego support to help with frustration.  LCSW to follow for evaluation and support when appropriate.  On Prozac 5. Neuropsych: This patient is not capable of making decisions on his own behalf. 6. Skin/Wound Care: routine pressure relief measures.  7. Fluids/Electrolytes/Nutrition: Monitor I/Os. Added full supervision at meals for safety., BMET within acceptable range on 5/27 and 6/3, I 671ml recorded with meals yesterday8. PAF/CAD/HTN: Monitor HR bid. ON metoprolol and Lipitor.  Vitals:   03/14/18 2055 03/15/18 0429  BP: (!) 163/61 (!) 143/58  Pulse: (!) 56 (!) 55  Resp: 18 18  Temp: (!) 97.5 F (36.4 C) (!) 97.4 F (36.3 C)  SpO2: 99% 96%   low dose amlodipine started 5/28 labile on 6/7, will consider further medication adjustments if persistently elevated 9. T2DM: Hgb A1C- 7.1.  Monitor BS ac/hs and use SSI for elevated BS. Metformin d/ced due to GI Side effects CBG (last 3)  Recent Labs    03/14/18 1631 03/14/18 2053 03/15/18 0703  GLUCAP 104* 135* 119*   Controlled 6/7, just on SSI 10. Myasthenia Gravis with intermittent diplopia: On mestonin 30 mg bid--to be increased to 60 mg bid per discharge summary, daughter doesn't want him to have this, had EMG confirmation. Monitor for fatiguability , ocular symptoms, if this is limiting therapy will revisit this with daughter as pt cannot make a decision, pt also cannot provide subjective symptoms 11. Glaucoma: Continue Ocuvite bid and  Xalatan at bedtime.  12.  Hypoalb- prostat 13.  Insomnia- improved, did not take trazodone last noc   LOS (Days) 16 A FACE TO FACE EVALUATION WAS PERFORMED  Charlett Blake 03/15/2018, 8:24 AM

## 2018-03-16 ENCOUNTER — Inpatient Hospital Stay (HOSPITAL_COMMUNITY): Payer: PPO | Admitting: Physical Therapy

## 2018-03-16 ENCOUNTER — Inpatient Hospital Stay (HOSPITAL_COMMUNITY): Payer: PPO

## 2018-03-16 DIAGNOSIS — H532 Diplopia: Secondary | ICD-10-CM

## 2018-03-16 LAB — GLUCOSE, CAPILLARY
GLUCOSE-CAPILLARY: 126 mg/dL — AB (ref 65–99)
GLUCOSE-CAPILLARY: 170 mg/dL — AB (ref 65–99)
Glucose-Capillary: 169 mg/dL — ABNORMAL HIGH (ref 65–99)
Glucose-Capillary: 123 mg/dL — ABNORMAL HIGH (ref 65–99)
Glucose-Capillary: 142 mg/dL — ABNORMAL HIGH (ref 65–99)

## 2018-03-16 NOTE — Progress Notes (Signed)
Occupational Therapy Session Note  Patient Details  Name: Kyle Richardson MRN: [id number] Date of Birth: 10-Jun-1928  Today's Date: 03/16/2018 OT Individual Time: 6213-0865 OT Individual Time Calculation (min): 60 min    Short Term Goals: Week 3:  OT Short Term Goal 1 (Week 3): Pt will complete toilet transfer with Mod A OT Short Term Goal 2 (Week 3): Pt will use R UE functionally to was chest with no more than min guided A OT Short Term Goal 3 (Week 3): Pt will locate 2/4 grooming tasks on R side of the sink with no more than mod instructional cues  Skilled Therapeutic Interventions/Progress Updates:    Pt received supine in bed agreeable to therapy, reporting urinary incontinence. Pt followed commands for bed mobility and required min A for thoroughness in rolling R and L. Pt transitioned to sitting EOB with min A and maintained EOB balance with min-(S) level throughout b/d tasks. Forced use of R UE throughout session, providing vc for initiation and allowing pt time to initiate and to correct over/under shooting. Min HOH required for R UE to hold and apply deodorant. Min A provided for sit to stand transfer and mod A required to maintain static standing balance when B UE were unsupported to pull up pants. Attempted to complete stand pivot transfer to the R, but pt unable to sequence movements and demo unsafe LOB. Pt returned to sitting EOB and completed stand pivot toward the L with mod A. Mod HOH provided at sink to apply toothpaste with R UE, as well as vc for visual attention to R UE. QRB was donned. Initiated self feeding with pt and NT took over.   Therapy Documentation Precautions:  Precautions Precautions: Fall Precaution Comments: R hemiplegia, increased R hamstring tone Restrictions Weight Bearing Restrictions: No   Vital Signs: Therapy Vitals Temp: (!) 97.5 F (36.4 C) Temp Source: Oral Pulse Rate: (!) 50 Resp: 17 BP: (!) 141/61 Patient Position (if appropriate):  Lying Oxygen Therapy SpO2: 95 % O2 Device: Room Air Pain: Pain Assessment Pain Scale: 0-10 Pain Score: 3  Pain Location: Back Pain Orientation: Distal Pain Descriptors / Indicators: Aching Pain Onset: On-going Pain Intervention(s): Therapeutic touch;Distraction ADL: ADL ADL Comments: Please see functional navigator   See Function Navigator for Current Functional Status.   Therapy/Group: Individual Therapy  Curtis Sites 03/16/2018, 8:31 AM

## 2018-03-16 NOTE — Progress Notes (Signed)
Subjective/Complaints: No new complaints.  Was able to sleep well last night  ROS: limited due to language/communication    Objective: Vital Signs: Blood pressure 104/66, pulse 68, temperature (!) 97.5 F (36.4 C), temperature source Oral, resp. rate 17, height 5\' 8"  (1.727 m), weight 73.2 kg (161 lb 6 oz), SpO2 95 %. No results found. Results for orders placed or performed during the hospital encounter of 02/27/18 (from the past 72 hour(s))  Glucose, capillary     Status: Abnormal   Collection Time: 03/13/18 11:36 AM  Result Value Ref Range   Glucose-Capillary 165 (H) 65 - 99 mg/dL  Glucose, capillary     Status: Abnormal   Collection Time: 03/13/18  4:40 PM  Result Value Ref Range   Glucose-Capillary 126 (H) 65 - 99 mg/dL  Glucose, capillary     Status: Abnormal   Collection Time: 03/13/18  9:51 PM  Result Value Ref Range   Glucose-Capillary 126 (H) 65 - 99 mg/dL  Glucose, capillary     Status: Abnormal   Collection Time: 03/14/18  6:32 AM  Result Value Ref Range   Glucose-Capillary 121 (H) 65 - 99 mg/dL  Glucose, capillary     Status: Abnormal   Collection Time: 03/14/18 11:32 AM  Result Value Ref Range   Glucose-Capillary 122 (H) 65 - 99 mg/dL  Glucose, capillary     Status: Abnormal   Collection Time: 03/14/18  4:31 PM  Result Value Ref Range   Glucose-Capillary 104 (H) 65 - 99 mg/dL  Glucose, capillary     Status: Abnormal   Collection Time: 03/14/18  8:53 PM  Result Value Ref Range   Glucose-Capillary 135 (H) 65 - 99 mg/dL  Glucose, capillary     Status: Abnormal   Collection Time: 03/15/18  7:03 AM  Result Value Ref Range   Glucose-Capillary 119 (H) 65 - 99 mg/dL  Glucose, capillary     Status: Abnormal   Collection Time: 03/15/18 11:55 AM  Result Value Ref Range   Glucose-Capillary 133 (H) 65 - 99 mg/dL   Comment 1 Notify RN   Glucose, capillary     Status: Abnormal   Collection Time: 03/15/18  4:39 PM  Result Value Ref Range   Glucose-Capillary 117 (H)  65 - 99 mg/dL  Glucose, capillary     Status: Abnormal   Collection Time: 03/15/18  9:26 PM  Result Value Ref Range   Glucose-Capillary 157 (H) 65 - 99 mg/dL  Glucose, capillary     Status: Abnormal   Collection Time: 03/16/18  6:49 AM  Result Value Ref Range   Glucose-Capillary 126 (H) 65 - 99 mg/dL   Comment 1 Notify RN      Constitutional: No distress . Vital signs reviewed. HEENT: EOMI, oral membranes moist Neck: supple Cardiovascular: RRR without murmur. No JVD    Respiratory: CTA Bilaterally without wheezes or rales. Normal effort    GI: BS +, non-tender, non-distended  Musc: No edema or tenderness in extremities. Neuro: Alert.  Word finding deficits Motor: Limited due to participation and apraxia ?RUE: 0/5 proximal distal ?RLE: Hip flexion, knee extension, ADF 4/5 ?LLE: 4+/5 proximal to distal  .   Assessment/Plan: 1. Functional deficits secondary to left PCA infarct with right hemiparesis, receptive aphasia which require 3+ hours per day of interdisciplinary therapy in a comprehensive inpatient rehab setting. Physiatrist is providing close team supervision and 24 hour management of active medical problems listed below. Physiatrist and rehab team continue to assess barriers to discharge/monitor  patient progress toward functional and medical goals. FIM: Function - Bathing Position: Shower Body parts bathed by patient: Chest, Abdomen, Front perineal area, Right arm, Left arm, Right upper leg, Left upper leg, Right lower leg, Left lower leg, Buttocks Body parts bathed by helper: Buttocks Assist Level: Touching or steadying assistance(Pt > 75%)  Function- Upper Body Dressing/Undressing What is the patient wearing?: Pull over shirt/dress Pull over shirt/dress - Perfomed by patient: Thread/unthread left sleeve, Put head through opening Pull over shirt/dress - Perfomed by helper: Pull shirt over trunk, Thread/unthread right sleeve Assist Level: Touching or steadying  assistance(Pt > 75%) Function - Lower Body Dressing/Undressing What is the patient wearing?: Pants, Non-skid slipper socks Position: Wheelchair/chair at sink Underwear - Performed by helper: Thread/unthread right underwear leg, Thread/unthread left underwear leg, Pull underwear up/down Pants- Performed by patient: Thread/unthread right pants leg, Thread/unthread left pants leg, Pull pants up/down Pants- Performed by helper: Pull pants up/down, Thread/unthread right pants leg Non-skid slipper socks- Performed by helper: Don/doff right sock, Don/doff left sock Socks - Performed by helper: Don/doff right sock, Don/doff left sock Shoes - Performed by helper: Don/doff right shoe, Don/doff left shoe, Fasten right, Fasten left Assist for footwear: Partial/moderate assist Assist for lower body dressing: Touching or steadying assistance (Pt > 75%)  Function - Toileting Toileting activity did not occur: No continent bowel/bladder event Toileting steps completed by helper: Adjust clothing prior to toileting, Performs perineal hygiene, Adjust clothing after toileting Assist level: Two helpers  Function - Air cabin crew transfer activity did not occur: Safety/medical concerns Toilet transfer assistive device: Facilities manager lift: Stedy Assist level to toilet: 2 helpers Assist level from toilet: 2 helpers  Function - Chair/bed transfer Chair/bed transfer method: Squat pivot Chair/bed transfer assist level: Moderate assist (Pt 50 - 74%/lift or lower) Chair/bed transfer assistive device: Armrests Mechanical lift: Stedy Chair/bed transfer details: Manual facilitation for placement, Manual facilitation for weight shifting, Verbal cues for sequencing, Verbal cues for technique  Function - Locomotion: Wheelchair Will patient use wheelchair at discharge?: Yes Type: Manual Wheelchair activity did not occur: Safety/medical concerns Max wheelchair distance: 150' Assist Level:  Touching or steadying assistance (Pt > 75%) Wheel 50 feet with 2 turns activity did not occur: Safety/medical concerns Assist Level: Touching or steadying assistance (Pt > 75%) Wheel 150 feet activity did not occur: Safety/medical concerns Assist Level: Touching or steadying assistance (Pt > 75%) Turns around,maneuvers to table,bed, and toilet,negotiates 3% grade,maneuvers on rugs and over doorsills: No Function - Locomotion: Ambulation Ambulation activity did not occur: Safety/medical concerns Assistive device: Walker-rolling, Orthosis Max distance: 60 ft Assist level: 2 helpers(pt mod assist) Walk 10 feet activity did not occur: Safety/medical concerns Assist level: 2 helpers Walk 50 feet with 2 turns activity did not occur: Safety/medical concerns Assist level: 2 helpers Walk 150 feet activity did not occur: Safety/medical concerns Walk 10 feet on uneven surfaces activity did not occur: Safety/medical concerns  Function - Comprehension Comprehension: Auditory Comprehension assistive device: Hearing aids Comprehension assist level: Understands basic 75 - 89% of the time/ requires cueing 10 - 24% of the time  Function - Expression Expression: Verbal Expression assist level: Expresses basic 50 - 74% of the time/requires cueing 25 - 49% of the time. Needs to repeat parts of sentences.  Function - Social Interaction Social Interaction assist level: Interacts appropriately 50 - 74% of the time - May be physically or verbally inappropriate.  Function - Problem Solving Problem solving assist level: Solves basic 25 - 49% of the  time - needs direction more than half the time to initiate, plan or complete simple activities  Function - Memory Memory assist level: Recognizes or recalls 25 - 49% of the time/requires cueing 50 - 75% of the time Patient normally able to recall (first 3 days only): None of the above   Medical Problem List and Plan: 1.  Right hemiparesis and global aphasia  secondary to embolic left PCA infarct.  Cont CIR-PT, OT, SLP 2.  DVT Prophylaxis/Anticoagulation:  Eliquis  will serve as DVT prophylaxis as well 3. Pain Management: tylenol prn.  4. Mood: Team to provide ego support to help with frustration.  LCSW to follow for evaluation and support when appropriate.  On Prozac 5. Neuropsych: This patient is not capable of making decisions on his own behalf. 6. Skin/Wound Care: routine pressure relief measures.  7. Fluids/Electrolytes/Nutrition: Monitor I/Os. Added full supervision at meals for safety., BMET within acceptable range on 5/27 and 6/3, I 668ml recorded with meals yesterday8. PAF/CAD/HTN: Monitor HR bid. ON metoprolol and Lipitor.  Vitals:   03/16/18 0619 03/16/18 0852  BP: (!) 141/61 104/66  Pulse: (!) 50 68  Resp: 17   Temp: (!) 97.5 F (36.4 C)   SpO2: 95%    low dose amlodipine started 5/28 Improved control 6/8 9. T2DM: Hgb A1C- 7.1.  Monitor BS ac/hs and use SSI for elevated BS. Metformin d/ced due to GI Side effects CBG (last 3)  Recent Labs    03/15/18 1639 03/15/18 2126 03/16/18 0649  GLUCAP 117* 157* 126*   Controlled 6/8, just on SSI 10. Myasthenia Gravis with intermittent diplopia: On mestonin 30 mg bid--to be increased to 60 mg bid per discharge summary, daughter doesn't want him to have this, had EMG confirmation. Monitor for fatiguability , ocular symptoms, if this is limiting therapy will revisit this with daughter as pt cannot make a decision, pt also cannot provide subjective symptoms 11. Glaucoma: Continue Ocuvite bid and  Xalatan at bedtime.  12.  Hypoalb- prostat 13.  Insomnia- improved    LOS (Days) 17 A FACE TO FACE EVALUATION WAS PERFORMED  Meredith Staggers 03/16/2018, 9:04 AM

## 2018-03-16 NOTE — Progress Notes (Signed)
Physical Therapy Session Note  Patient Details  Name: Kyle Richardson MRN: [id number] Date of Birth: Dec 19, 1927  Today's Date: 03/16/2018 PT Individual Time: 6387-5643 PT Individual Time Calculation (min): 38 min   Short Term Goals: Week 3:  PT Short Term Goal 1 (Week 3): Pt will perform bed<>chair transfer w/ mod assist consistently PT Short Term Goal 2 (Week 3): Pt will self-propel manual w/c 25' w/ supervision PT Short Term Goal 3 (Week 3): Pt will initiate stair training  Skilled Therapeutic Interventions/Progress Updates:  Pt received asleep in w/c but easily awakened & agreeable to tx. No c/o pain reported. Transported pt to dayroom via w/c total assist. Therapist provided total assist for turning hearing aides on. Pt set up to use kinetron from w/c level with task focusing on BLE strengthening & NMR; pt able to perform activity with up to 40 cm/sec resistance. Pt propels w/c x 30 ft + 30 ft with BLE & min assist with task focusing on reciprocal movements, BLE strengthening & RLE NMR; pt fatigues quickly & requires rest break between each trial. Pt returned to room & assisted to standing in steady with NT and pt pulling up with LUE. Pt left in care of NT.  Therapy Documentation Precautions:  Precautions Precautions: Fall Precaution Comments: R hemiplegia, increased R hamstring tone Restrictions Weight Bearing Restrictions: No   See Function Navigator for Current Functional Status.   Therapy/Group: Individual Therapy  Waunita Schooner 03/16/2018, 10:47 AM

## 2018-03-17 ENCOUNTER — Inpatient Hospital Stay (HOSPITAL_COMMUNITY): Payer: PPO

## 2018-03-17 LAB — GLUCOSE, CAPILLARY
GLUCOSE-CAPILLARY: 167 mg/dL — AB (ref 65–99)
Glucose-Capillary: 130 mg/dL — ABNORMAL HIGH (ref 65–99)
Glucose-Capillary: 155 mg/dL — ABNORMAL HIGH (ref 65–99)

## 2018-03-17 NOTE — Progress Notes (Signed)
Subjective/Complaints: Patient without problems overnight.  States he was comfortable.  ROS: Limited due to cognitive/behavioral    Objective: Vital Signs: Blood pressure 112/65, pulse 64, temperature 97.6 F (36.4 C), resp. rate 18, height 5\' 8"  (1.727 m), weight 73.2 kg (161 lb 6 oz), SpO2 97 %. No results found. Results for orders placed or performed during the hospital encounter of 02/27/18 (from the past 72 hour(s))  Glucose, capillary     Status: Abnormal   Collection Time: 03/14/18 11:32 AM  Result Value Ref Range   Glucose-Capillary 122 (H) 65 - 99 mg/dL  Glucose, capillary     Status: Abnormal   Collection Time: 03/14/18  4:31 PM  Result Value Ref Range   Glucose-Capillary 104 (H) 65 - 99 mg/dL  Glucose, capillary     Status: Abnormal   Collection Time: 03/14/18  8:53 PM  Result Value Ref Range   Glucose-Capillary 135 (H) 65 - 99 mg/dL  Glucose, capillary     Status: Abnormal   Collection Time: 03/15/18  7:03 AM  Result Value Ref Range   Glucose-Capillary 119 (H) 65 - 99 mg/dL  Glucose, capillary     Status: Abnormal   Collection Time: 03/15/18 11:55 AM  Result Value Ref Range   Glucose-Capillary 133 (H) 65 - 99 mg/dL   Comment 1 Notify RN   Glucose, capillary     Status: Abnormal   Collection Time: 03/15/18  4:39 PM  Result Value Ref Range   Glucose-Capillary 117 (H) 65 - 99 mg/dL  Glucose, capillary     Status: Abnormal   Collection Time: 03/15/18  9:26 PM  Result Value Ref Range   Glucose-Capillary 157 (H) 65 - 99 mg/dL  Glucose, capillary     Status: Abnormal   Collection Time: 03/16/18  6:49 AM  Result Value Ref Range   Glucose-Capillary 126 (H) 65 - 99 mg/dL   Comment 1 Notify RN   Glucose, capillary     Status: Abnormal   Collection Time: 03/16/18 11:25 AM  Result Value Ref Range   Glucose-Capillary 169 (H) 65 - 99 mg/dL  Glucose, capillary     Status: Abnormal   Collection Time: 03/16/18  4:53 PM  Result Value Ref Range   Glucose-Capillary 123  (H) 65 - 99 mg/dL  Glucose, capillary     Status: Abnormal   Collection Time: 03/16/18  9:05 PM  Result Value Ref Range   Glucose-Capillary 170 (H) 65 - 99 mg/dL   Comment 1 Notify RN   Glucose, capillary     Status: Abnormal   Collection Time: 03/16/18  9:49 PM  Result Value Ref Range   Glucose-Capillary 142 (H) 65 - 99 mg/dL  Glucose, capillary     Status: Abnormal   Collection Time: 03/17/18  6:50 AM  Result Value Ref Range   Glucose-Capillary 130 (H) 65 - 99 mg/dL   Comment 1 Notify RN      Constitutional: No distress . Vital signs reviewed. HEENT: EOMI, oral membranes moist Neck: supple Cardiovascular: RRR without murmur. No JVD    Respiratory: CTA Bilaterally without wheezes or rales. Normal effort    GI: BS +, non-tender, non-distended  Musc: No edema or tenderness in extremities. Neuro: Alert.  Word finding deficits Motor: Limited due to participation and apraxia ?RUE: 0/5 proximal distal ?RLE: Hip flexion, knee extension, ADF 4/5 ?LLE: 4+/5 proximal to distal  .   Assessment/Plan: 1. Functional deficits secondary to left PCA infarct with right hemiparesis, receptive aphasia which require  3+ hours per day of interdisciplinary therapy in a comprehensive inpatient rehab setting. Physiatrist is providing close team supervision and 24 hour management of active medical problems listed below. Physiatrist and rehab team continue to assess barriers to discharge/monitor patient progress toward functional and medical goals. FIM: Function - Bathing Position: Sitting EOB Body parts bathed by patient: Chest, Abdomen, Front perineal area, Right arm, Left arm, Right upper leg, Left upper leg, Right lower leg, Left lower leg, Buttocks Body parts bathed by helper: Back Assist Level: Touching or steadying assistance(Pt > 75%)  Function- Upper Body Dressing/Undressing What is the patient wearing?: Pull over shirt/dress Pull over shirt/dress - Perfomed by patient: Thread/unthread  left sleeve, Put head through opening, Pull shirt over trunk Pull over shirt/dress - Perfomed by helper: Thread/unthread right sleeve Assist Level: Touching or steadying assistance(Pt > 75%) Function - Lower Body Dressing/Undressing What is the patient wearing?: Pants, Shoes, Socks Position: Sitting EOB Underwear - Performed by helper: Thread/unthread right underwear leg, Thread/unthread left underwear leg, Pull underwear up/down Pants- Performed by patient: Thread/unthread right pants leg, Thread/unthread left pants leg, Pull pants up/down Pants- Performed by helper: Pull pants up/down, Thread/unthread right pants leg Non-skid slipper socks- Performed by helper: Don/doff right sock, Don/doff left sock Socks - Performed by helper: Don/doff right sock, Don/doff left sock Shoes - Performed by helper: Don/doff right shoe, Don/doff left shoe, Fasten right, Fasten left Assist for footwear: Partial/moderate assist Assist for lower body dressing: Touching or steadying assistance (Pt > 75%)  Function - Toileting Toileting activity did not occur: No continent bowel/bladder event Toileting steps completed by helper: Adjust clothing prior to toileting, Performs perineal hygiene, Adjust clothing after toileting Assist level: Two helpers  Function - Air cabin crew transfer activity did not occur: Safety/medical concerns Toilet transfer assistive device: Facilities manager lift: Stedy Assist level to toilet: 2 helpers Assist level from toilet: 2 helpers  Function - Chair/bed transfer Chair/bed transfer method: Stand pivot Chair/bed transfer assist level: Touching or steadying assistance (Pt > 75%) Chair/bed transfer assistive device: Armrests Mechanical lift: Stedy Chair/bed transfer details: Manual facilitation for placement, Manual facilitation for weight shifting, Verbal cues for sequencing, Verbal cues for technique  Function - Locomotion: Wheelchair Will patient use  wheelchair at discharge?: Yes Type: Manual Wheelchair activity did not occur: Safety/medical concerns Max wheelchair distance: 30 ft  Assist Level: Touching or steadying assistance (Pt > 75%) Wheel 50 feet with 2 turns activity did not occur: Safety/medical concerns Assist Level: Touching or steadying assistance (Pt > 75%) Wheel 150 feet activity did not occur: Safety/medical concerns Assist Level: Touching or steadying assistance (Pt > 75%) Turns around,maneuvers to table,bed, and toilet,negotiates 3% grade,maneuvers on rugs and over doorsills: No Function - Locomotion: Ambulation Ambulation activity did not occur: Safety/medical concerns Assistive device: Walker-rolling, Orthosis Max distance: 60 ft Assist level: 2 helpers(pt mod assist) Walk 10 feet activity did not occur: Safety/medical concerns Assist level: 2 helpers Walk 50 feet with 2 turns activity did not occur: Safety/medical concerns Assist level: 2 helpers Walk 150 feet activity did not occur: Safety/medical concerns Walk 10 feet on uneven surfaces activity did not occur: Safety/medical concerns  Function - Comprehension Comprehension: Auditory Comprehension assistive device: Hearing aids Comprehension assist level: Understands basic 75 - 89% of the time/ requires cueing 10 - 24% of the time  Function - Expression Expression: Verbal Expression assist level: Expresses basic 50 - 74% of the time/requires cueing 25 - 49% of the time. Needs to repeat parts of sentences.  Function - Social Interaction Social Interaction assist level: Interacts appropriately 50 - 74% of the time - May be physically or verbally inappropriate.  Function - Problem Solving Problem solving assist level: Solves basic 25 - 49% of the time - needs direction more than half the time to initiate, plan or complete simple activities  Function - Memory Memory assist level: Recognizes or recalls 25 - 49% of the time/requires cueing 50 - 75% of the  time Patient normally able to recall (first 3 days only): None of the above   Medical Problem List and Plan: 1.  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  Cont CIR-PT, OT, SLP 2.  DVT Prophylaxis/Anticoagulation:  Eliquis  will serve as DVT prophylaxis as well 3. Pain Management: tylenol prn.  4. Mood: Team to provide ego support to help with frustration.  LCSW to follow for evaluation and support when appropriate.  On Prozac 5. Neuropsych: This patient is not capable of making decisions on his own behalf. 6. Skin/Wound Care: routine pressure relief measures.  7. Fluids/Electrolytes/Nutrition: Monitor I/Os. Added full supervision at meals for safety., BMET within acceptable range on 5/27 and 6/3, I 937ml recorded with meals yesterday8. PAF/CAD/HTN: Monitor HR bid. ON metoprolol and Lipitor.  Vitals:   03/17/18 0556 03/17/18 0841  BP: (!) 146/65 112/65  Pulse: (!) 53 64  Resp: 18   Temp: 97.6 F (36.4 C)   SpO2: 97%    low dose amlodipine started 5/28 Improved control 6/9 9. T2DM: Hgb A1C- 7.1.  Monitor BS ac/hs and use SSI for elevated BS. Metformin d/ced due to GI Side effects CBG (last 3)  Recent Labs    03/16/18 2105 03/16/18 2149 03/17/18 0650  GLUCAP 170* 142* 130*   Reasonable control 6/9, continue SSI 10. Myasthenia Gravis with intermittent diplopia: On mestonin 30 mg bid--to be increased to 60 mg bid per discharge summary, daughter doesn't want him to have this, had EMG confirmation. Monitor for fatiguability, ocular symptoms--- but appears to be tolerating therapy thus far,   -Consider revisiting treatments with daughter if further symptoms arise as pt cannot make a decision, pt also cannot provide subjective symptoms 11. Glaucoma: Continue Ocuvite bid and  Xalatan at bedtime.  12.  Hypoalb- prostat 13.  Insomnia- improved    LOS (Days) 18 A FACE TO FACE EVALUATION WAS PERFORMED  Meredith Staggers 03/17/2018, 8:58 AM

## 2018-03-17 NOTE — Progress Notes (Signed)
Occupational Therapy Session Note  Patient Details  Name: Colbe Viviano MRN: [id number] Date of Birth: [birth year]  Today's Date: 03/17/2018 OT Individual Time: 3383-2919 OT Individual Time Calculation (min): 26 min    Short Term Goals: Week 3:  OT Short Term Goal 1 (Week 3): Pt will complete toilet transfer with Mod A OT Short Term Goal 2 (Week 3): Pt will use R UE functionally to was chest with no more than min guided A OT Short Term Goal 3 (Week 3): Pt will locate 2/4 grooming tasks on R side of the sink with no more than mod instructional cues  Skilled Therapeutic Interventions/Progress Updates:    1:1. Pt asleep upon arrival but easily aroused. Pt with no c/o pain. Pt completes supine>sitting EOB with touching A for balance. Pt squat pivot transfer to L with MOD A and Vc for hand placement. Pt grooms at sink completing oral care, hair crushing and applying deodorant with all items placed on R and mod VC for locating/head turns. OT factilitates reach/grasp/release with RUE for NMR when applying toothpaste/deodorant. Pt dons shirt with A to thread RUE. Exited session with pt seated in TIS, call light in LUE and QURB donned.   Therapy Documentation Precautions:  Precautions Precautions: Fall Precaution Comments: R hemiplegia, increased R hamstring tone Restrictions Weight Bearing Restrictions: No General:   Vital Signs: Therapy Vitals Temp: 97.6 F (36.4 C) Pulse Rate: (!) 53 Resp: 18 BP: (!) 146/65 Patient Position (if appropriate): Lying Oxygen Therapy SpO2: 97 % O2 Device: Room Air  See Function Navigator for Current Functional Status.   Therapy/Group: Individual Therapy  Tonny Branch 03/17/2018, 8:26 AM

## 2018-03-17 NOTE — Progress Notes (Signed)
Physical Therapy Session Note  Patient Details  Name: Kyle Richardson MRN: [id number] Date of Birth: 09/19/1928  Today's Date: 03/17/2018 PT Individual Time: 1300-1410 PT Individual Time Calculation (min): 70 min   Short Term Goals: Week 1:  PT Short Term Goal 1 (Week 1): Pt will maintain static sitting balance w/ supervision during functional mobility PT Short Term Goal 1 - Progress (Week 1): Met PT Short Term Goal 2 (Week 1): Pt will perform sit<>stand tranfser w/ max assist x1 PT Short Term Goal 2 - Progress (Week 1): Met PT Short Term Goal 3 (Week 1): Pt will initiate gait training  PT Short Term Goal 3 - Progress (Week 1): Met PT Short Term Goal 4 (Week 1): Pt will attend to R side of body/environment w/ max cues, 100% of time PT Short Term Goal 4 - Progress (Week 1): Partly met Week 2:  PT Short Term Goal 1 (Week 2): Pt will ambulate 30' w/ max assist x2 using LRAD PT Short Term Goal 1 - Progress (Week 2): Met PT Short Term Goal 2 (Week 2): Pt will attend to R environment during functional mobility w/ min cues PT Short Term Goal 2 - Progress (Week 2): Met PT Short Term Goal 3 (Week 2): Pt will initiate self-propelling manual w/c PT Short Term Goal 3 - Progress (Week 2): Met PT Short Term Goal 4 (Week 2): Pt will transfer bed<>chair w/ mod assist 50% of the time PT Short Term Goal 4 - Progress (Week 2): Progressing toward goal Week 3:  PT Short Term Goal 1 (Week 3): Pt will perform bed<>chair transfer w/ mod assist consistently PT Short Term Goal 2 (Week 3): Pt will self-propel manual w/c 25' w/ supervision PT Short Term Goal 3 (Week 3): Pt will initiate stair training  Skilled Therapeutic Interventions/Progress Updates:   Pt sitting up in w/c, stating he needed to use toilet for BM.  +2 use of Stedy for w/c to BSC over toilet in BR. Pt continent of bowel, but brief and pants were wet with urine.  Pt stood with bil hands on Stedy while PT performed peri care.  Pt sat while PT managed  brief and pants over feet, and stood for brief and pants to be pulled up.  Pt washed hands at sink in sitting in w/c, with min assist to manage RUE.  Pt described difficulties with vision: losing R lower quadrant.  With multimodal cues and physical assist, pt able to learn compensatory techniques of turning head to R, and using L index finger to touch object in midline> R visual fields, in sitting and supported standing.  Standing frame x 12 minutes during neuro re-ed for scanning visually to R, to find post it notes with letters of his last name on them.  Pt needed extensive time and max cueing to locate notes to R of midline, and still had difficulty identifying letters correctly.   Pt left resting in w/c with quick release belt applied, seat alarm set and all needs within reach. Pt's wife and dtr arrived.  PT instructed them in use of compensatory techniques to increase visual scanning to R.    Therapy Documentation Precautions:  Precautions Precautions: Fall Precaution Comments: R hemiplegia, increased R hamstring tone Restrictions Weight Bearing Restrictions: No   Pain: Pain Assessment Pain Score: 0-No pain   See Function Navigator for Current Functional Status.   Therapy/Group: Individual Therapy  , 03/17/2018, 4:35 PM  

## 2018-03-18 ENCOUNTER — Inpatient Hospital Stay (HOSPITAL_COMMUNITY): Payer: PPO

## 2018-03-18 ENCOUNTER — Inpatient Hospital Stay (HOSPITAL_COMMUNITY): Payer: PPO | Admitting: Speech Pathology

## 2018-03-18 LAB — GLUCOSE, CAPILLARY
GLUCOSE-CAPILLARY: 148 mg/dL — AB (ref 65–99)
GLUCOSE-CAPILLARY: 127 mg/dL — AB (ref 65–99)
GLUCOSE-CAPILLARY: 165 mg/dL — AB (ref 65–99)
Glucose-Capillary: 169 mg/dL — ABNORMAL HIGH (ref 65–99)
Glucose-Capillary: 129 mg/dL — ABNORMAL HIGH (ref 65–99)

## 2018-03-18 LAB — CBC
HCT: 43.3 % (ref 39.0–52.0)
Hemoglobin: 14.1 g/dL (ref 13.0–17.0)
MCH: 31.5 pg (ref 26.0–34.0)
MCHC: 32.6 g/dL (ref 30.0–36.0)
MCV: 96.9 fL (ref 78.0–100.0)
PLATELETS: 176 10*3/uL (ref 150–400)
RBC: 4.47 MIL/uL (ref 4.22–5.81)
RDW: 13.2 % (ref 11.5–15.5)
WBC: 5.9 10*3/uL (ref 4.0–10.5)

## 2018-03-18 LAB — BASIC METABOLIC PANEL
Anion gap: 7 (ref 5–15)
BUN: 20 mg/dL (ref 6–20)
CALCIUM: 8.7 mg/dL — AB (ref 8.9–10.3)
CO2: 26 mmol/L (ref 22–32)
Chloride: 108 mmol/L (ref 101–111)
Creatinine, Ser: 0.84 mg/dL (ref 0.61–1.24)
GFR calc Af Amer: >60 mL/min (ref >60)
GLUCOSE: 127 mg/dL — AB (ref 65–99)
Potassium: 3.9 mmol/L (ref 3.5–5.1)
Sodium: 141 mmol/L (ref 135–145)

## 2018-03-18 NOTE — Plan of Care (Signed)
  Problem: Consults Goal: RH STROKE PATIENT EDUCATION Description See Patient Education module for education specifics  Outcome: Progressing   Problem: RH BOWEL ELIMINATION Goal: RH STG MANAGE BOWEL WITH ASSISTANCE Description STG Manage Bowel with mod Assistance.  Outcome: Progressing Goal: RH STG MANAGE BOWEL W/MEDICATION W/ASSISTANCE Description STG Manage Bowel with Medication with mod Assistance.  Outcome: Progressing   Problem: RH SKIN INTEGRITY Goal: RH STG SKIN FREE OF INFECTION/BREAKDOWN Description Pt will remain free from skin infection/ breakdown with mod assistance.  Outcome: Progressing Goal: RH STG MAINTAIN SKIN INTEGRITY WITH ASSISTANCE Description STG Maintain Skin Integrity With mod Assistance.  Outcome: Progressing   Problem: RH SAFETY Goal: RH STG ADHERE TO SAFETY PRECAUTIONS W/ASSISTANCE/DEVICE Description STG Adhere to Safety Precautions With mod  Assistance/Device.  Outcome: Progressing   Problem: RH COGNITION-NURSING Goal: RH STG USES MEMORY AIDS/STRATEGIES W/ASSIST TO PROBLEM SOLVE Description STG Uses Memory Aids/Strategies With mod Assistance to Problem Solve.  Outcome: Progressing Goal: RH STG ANTICIPATES NEEDS/CALLS FOR ASSIST W/ASSIST/CUES Description STG Anticipates Needs/Calls for Assist With mod Assistance/Cues.  Outcome: Progressing   Problem: RH KNOWLEDGE DEFICIT Goal: RH STG INCREASE KNOWLEDGE OF DIABETES Description Mod assist  Outcome: Progressing Goal: RH STG INCREASE KNOWLEDGE OF HYPERTENSION Description Mod assist  Outcome: Progressing   Problem: RH BLADDER ELIMINATION Goal: RH STG MANAGE BLADDER WITH ASSISTANCE Description STG Manage Bladder With mod Assistance  Outcome: Not Progressing  Incontinent at times, encouraged to call staff when feel urge to urinate.

## 2018-03-18 NOTE — Progress Notes (Signed)
Physical Therapy Session Note  Patient Details  Name: Kyle Richardson MRN: [id number] Date of Birth: 08-01-1928  Today's Date: 03/18/2018 PT Individual Time: 1856-3149 PT Individual Time Calculation (min): 88 min   Short Term Goals: Week 3:  PT Short Term Goal 1 (Week 3): Pt will perform bed<>chair transfer w/ mod assist consistently PT Short Term Goal 2 (Week 3): Pt will self-propel manual w/c 25' w/ supervision PT Short Term Goal 3 (Week 3): Pt will initiate stair training  Skilled Therapeutic Interventions/Progress Updates:    Pt supine in bed upon PT arrival, agreeable to therapy tx and denies pain. Pt transferred from supine>sitting EOB with supervision and verbal cues for R UE/LE awareness. Pt performed stand pivot transfer with RW and R hand orthosis from bed>w/c with mod assist. Pt transported to the gym. Pt performed blocked practice of stand pivot transfers from w/c<>mat x 5 each direction with RW and R hand orthosis, fluctuating from min to max assist, verbal cues for R LE awareness/foot placement with turns. Pt worked on dynamic standing balance without UE support in order to reach outside BoS for clothespins on basketball net, x 2 trials with emphasis on L lateral weightshifting and x 1 trial with R anteriolateral weightshifting, min assist. Pt ambulated x 40 ft with mod assist, verbal cues for R foot placement, manual assist to guide RW, max assist with turning to sit. Therapist performed 2 x 30 sec manual hamstring stretch to R LE for ROM and tone management. Pt worked on standing balance while standing on red wedge, x 2 trials. Pt worked on standing balance, R UE NMR and cognition to work on reaching for numbered dots and naming numbers, x 2 trials. Pt ambulated x 30 ft with RW and max assist, verbal cues for R sided awareness and placement, manual assist for RW management. Pt transported back to room and left seated in TIS w/c with needs in reach and family member present, QRB in place.    Therapy Documentation Precautions:  Precautions Precautions: Fall Precaution Comments: R hemiplegia, increased R hamstring tone Restrictions Weight Bearing Restrictions: No   See Function Navigator for Current Functional Status.   Therapy/Group: Individual Therapy  Netta Corrigan, PT, DPT 03/18/2018, 7:54 AM

## 2018-03-18 NOTE — Progress Notes (Signed)
Speech Language Pathology Daily Session Note  Patient Details  Name: Kyle Richardson MRN: [id number] Date of Birth: [birth year]  Today's Date: 03/18/2018 SLP Individual Time: 0830-0930 SLP Individual Time Calculation (min): 60 min  Short Term Goals: Week 3: SLP Short Term Goal 1 (Week 3): Patient will attend to right field of enviornment during functional tasks with Min A multimodal cues.  SLP Short Term Goal 2 (Week 3): Patient will demonstrate selective attention to tasks for ~15 minutes with Min A verbal cues for redirection in a mildly distracting enviornment.  SLP Short Term Goal 3 (Week 3): Patient will demonstrate functional problem solving for basic and familiar tasks with Mod A verbal cues.  SLP Short Term Goal 4 (Week 3): Patient will self-monitor and correct verbal errors at the phrase level during strutured tasks with Min A verbal cues.  SLP Short Term Goal 5 (Week 3): Patient will decode at the word level with Max A multimodal cues.  SLP Short Term Goal 6 (Week 3): Patient will follow 2-step commands with Mod A verbal cues.   Skilled Therapeutic Interventions: Skilled treatment session focused on cognitive-linguistic goals. Upon arrival, patient was incontinent of urine. SLP changed brief and provided patient with a soft call bell to facilitate use. SLP facilitated session by providing Max A verbal cues for problem solving during a basic money management task of counting out specific amounts of change. Patient demonstrated difficulty identifying coins but could identify the amount the coin was worth and verbally perform simple math. Suspect patient's difficulty was due to visual deficits. Patient left upright in bed with all needs within reach. Continue with current plan of care.      Function:   Cognition Comprehension Comprehension assist level: Understands basic 75 - 89% of the time/ requires cueing 10 - 24% of the time  Expression   Expression assist level: Expresses basic 50 -  74% of the time/requires cueing 25 - 49% of the time. Needs to repeat parts of sentences.  Social Interaction Social Interaction assist level: Interacts appropriately 50 - 74% of the time - May be physically or verbally inappropriate.  Problem Solving Problem solving assist level: Solves basic 25 - 49% of the time - needs direction more than half the time to initiate, plan or complete simple activities  Memory Memory assist level: Recognizes or recalls 25 - 49% of the time/requires cueing 50 - 75% of the time    Pain No/Denies Pain   Therapy/Group: Individual Therapy  Talani Brazee 03/18/2018, 3:14 PM

## 2018-03-18 NOTE — Progress Notes (Signed)
Occupational Therapy Session Note  Patient Details  Name: Kyle Richardson MRN: [id number] Date of Birth: [birth year]  Today's Date: 03/18/2018 OT Individual Time: 1000-1055 OT Individual Time Calculation (min): 55 min    Short Term Goals: Week 3:  OT Short Term Goal 1 (Week 3): Pt will complete toilet transfer with Mod A OT Short Term Goal 2 (Week 3): Pt will use R UE functionally to was chest with no more than min guided A OT Short Term Goal 3 (Week 3): Pt will locate 2/4 grooming tasks on R side of the sink with no more than mod instructional cues  Skilled Therapeutic Interventions/Progress Updates:    Pt engaged in BADL retraining with focus on bathing/dressing with sit<>stand from w/c at sink.  Pt required min A for supine>sit EOB and mod A for squat pivot transfer to w/c.  Pt initiated bathing/dressing tasks but required assistance secondary to RUE ataxia.  Pt performed sit<>stand from w/c at sink with min A and steady A for standing balance.  Pt attempted to pull pants over hips but required assistance to pull up R side of pants.  Pt required assistance donning socks and shoes.  Pt remained in w/c with QRB in place and soft call bell within reach.   Therapy Documentation Precautions:  Precautions Precautions: Fall Precaution Comments: R hemiplegia, increased R hamstring tone Restrictions Weight Bearing Restrictions: No  Pain: Pain Assessment Pain Scale: 0-10 Pain Score: 0-No pain  See Function Navigator for Current Functional Status.   Therapy/Group: Individual Therapy  Leroy Libman 03/18/2018, 12:09 PM

## 2018-03-18 NOTE — Progress Notes (Signed)
Subjective/Complaints:  Patient is working with speech therapy.  Speech therapy notes that the patient is able to spell words given to him but is not able to read words from the page.   ROS: Limited due to cognitive/behavioral    Objective: Vital Signs: Blood pressure (!) 136/59, pulse (!) 51, temperature 97.7 F (36.5 C), temperature source Oral, resp. rate 12, height '5\' 8"'  (1.727 m), weight 73.2 kg (161 lb 6 oz), SpO2 95 %. No results found. Results for orders placed or performed during the hospital encounter of 02/27/18 (from the past 72 hour(s))  Glucose, capillary     Status: Abnormal   Collection Time: 03/15/18 11:55 AM  Result Value Ref Range   Glucose-Capillary 133 (H) 65 - 99 mg/dL   Comment 1 Notify RN   Glucose, capillary     Status: Abnormal   Collection Time: 03/15/18  4:39 PM  Result Value Ref Range   Glucose-Capillary 117 (H) 65 - 99 mg/dL  Glucose, capillary     Status: Abnormal   Collection Time: 03/15/18  9:26 PM  Result Value Ref Range   Glucose-Capillary 157 (H) 65 - 99 mg/dL  Glucose, capillary     Status: Abnormal   Collection Time: 03/16/18  6:49 AM  Result Value Ref Range   Glucose-Capillary 126 (H) 65 - 99 mg/dL   Comment 1 Notify RN   Glucose, capillary     Status: Abnormal   Collection Time: 03/16/18 11:25 AM  Result Value Ref Range   Glucose-Capillary 169 (H) 65 - 99 mg/dL  Glucose, capillary     Status: Abnormal   Collection Time: 03/16/18  4:53 PM  Result Value Ref Range   Glucose-Capillary 123 (H) 65 - 99 mg/dL  Glucose, capillary     Status: Abnormal   Collection Time: 03/16/18  9:05 PM  Result Value Ref Range   Glucose-Capillary 170 (H) 65 - 99 mg/dL   Comment 1 Notify RN   Glucose, capillary     Status: Abnormal   Collection Time: 03/16/18  9:49 PM  Result Value Ref Range   Glucose-Capillary 142 (H) 65 - 99 mg/dL  Glucose, capillary     Status: Abnormal   Collection Time: 03/17/18  6:50 AM  Result Value Ref Range   Glucose-Capillary 130 (H) 65 - 99 mg/dL   Comment 1 Notify RN   Glucose, capillary     Status: Abnormal   Collection Time: 03/17/18 11:32 AM  Result Value Ref Range   Glucose-Capillary 155 (H) 65 - 99 mg/dL  Glucose, capillary     Status: Abnormal   Collection Time: 03/17/18  4:24 PM  Result Value Ref Range   Glucose-Capillary 167 (H) 65 - 99 mg/dL  Glucose, capillary     Status: Abnormal   Collection Time: 03/17/18  9:26 PM  Result Value Ref Range   Glucose-Capillary 148 (H) 65 - 99 mg/dL  CBC     Status: None   Collection Time: 03/18/18  5:21 AM  Result Value Ref Range   WBC 5.9 4.0 - 10.5 K/uL   RBC 4.47 4.22 - 5.81 MIL/uL   Hemoglobin 14.1 13.0 - 17.0 g/dL   HCT 43.3 39.0 - 52.0 %   MCV 96.9 78.0 - 100.0 fL   MCH 31.5 26.0 - 34.0 pg   MCHC 32.6 30.0 - 36.0 g/dL   RDW 13.2 11.5 - 15.5 %   Platelets 176 150 - 400 K/uL    Comment: Performed at [facility], [address] [place], [place] [address]  Basic metabolic panel     Status: Abnormal   Collection Time: 03/18/18  5:21 AM  Result Value Ref Range   Sodium 141 135 - 145 mmol/L   Potassium 3.9 3.5 - 5.1 mmol/L   Chloride 108 101 - 111 mmol/L   CO2 26 22 - 32 mmol/L   Glucose, Bld 127 (H) 65 - 99 mg/dL   BUN 20 6 - 20 mg/dL   Creatinine, Ser 0.84 0.61 - 1.24 mg/dL   Calcium 8.7 (L) 8.9 - 10.3 mg/dL   GFR calc non Af Amer >60 >60 mL/min   GFR calc Af Amer >60 >60 mL/min    Comment: (NOTE) The eGFR has been calculated using the CKD EPI equation. This calculation has not been validated in all clinical situations. eGFR's persistently <60 mL/min signify possible Chronic Kidney Disease.    Anion gap 7 5 - 15    Comment: Performed at [facility] [address] [place], [place] [address]  Glucose, capillary     Status: Abnormal   Collection Time: 03/18/18  6:42 AM  Result Value Ref Range   Glucose-Capillary 127 (H) 65 - 99 mg/dL     Constitutional: No distress . Vital signs reviewed. HEENT: EOMI, oral  membranes moist Neck: supple Cardiovascular: RRR without murmur. No JVD    Respiratory: CTA Bilaterally without wheezes or rales. Normal effort    GI: BS +, non-tender, non-distended  Musc: No edema or tenderness in extremities. Neuro: Alert.  Word finding deficits Motor: Limited due to participation and apraxia ?RUE: 0/5 proximal distal ?RLE: Hip flexion, knee extension, ADF 4/5 ?LLE: 4+/5 proximal to distal  .   Assessment/Plan: 1. Functional deficits secondary to left PCA infarct with right hemiparesis, receptive aphasia which require 3+ hours per day of interdisciplinary therapy in a comprehensive inpatient rehab setting. Physiatrist is providing close team supervision and 24 hour management of active medical problems listed below. Physiatrist and rehab team continue to assess barriers to discharge/monitor patient progress toward functional and medical goals. FIM: Function - Bathing Position: Sitting EOB Body parts bathed by patient: Chest, Abdomen, Front perineal area, Right arm, Left arm, Right upper leg, Left upper leg, Right lower leg, Left lower leg, Buttocks Body parts bathed by helper: Back Assist Level: Touching or steadying assistance(Pt > 75%)  Function- Upper Body Dressing/Undressing What is the patient wearing?: Pull over shirt/dress Pull over shirt/dress - Perfomed by patient: Thread/unthread left sleeve, Put head through opening, Pull shirt over trunk Pull over shirt/dress - Perfomed by helper: Thread/unthread right sleeve Assist Level: Touching or steadying assistance(Pt > 75%) Function - Lower Body Dressing/Undressing What is the patient wearing?: Pants, Shoes, Socks Position: Sitting EOB Underwear - Performed by helper: Thread/unthread right underwear leg, Thread/unthread left underwear leg, Pull underwear up/down Pants- Performed by patient: Thread/unthread right pants leg, Thread/unthread left pants leg, Pull pants up/down Pants- Performed by helper: Pull  pants up/down, Thread/unthread right pants leg Non-skid slipper socks- Performed by helper: Don/doff right sock, Don/doff left sock Socks - Performed by helper: Don/doff right sock, Don/doff left sock Shoes - Performed by helper: Don/doff right shoe, Don/doff left shoe, Fasten right, Fasten left Assist for footwear: Partial/moderate assist Assist for lower body dressing: Touching or steadying assistance (Pt > 75%)  Function - Toileting Toileting activity did not occur: No continent bowel/bladder event Toileting steps completed by helper: Adjust clothing prior to toileting, Performs perineal hygiene, Adjust clothing after toileting Assist level: Supervision or  verbal cues  Function - Air cabin crew transfer activity did not occur: Safety/medical concerns Toilet transfer assistive device: Mechanical lift Mechanical lift: Stedy Assist level to toilet: 2 helpers Assist level from toilet: Maximal assist (Pt 25 - 49%/lift and lower)  Function - Chair/bed transfer Chair/bed transfer method: Stand pivot Chair/bed transfer assist level: Touching or steadying assistance (Pt > 75%) Chair/bed transfer assistive device: Armrests Mechanical lift: Stedy Chair/bed transfer details: Manual facilitation for placement, Manual facilitation for weight shifting, Verbal cues for sequencing, Verbal cues for technique  Function - Locomotion: Wheelchair Will patient use wheelchair at discharge?: Yes Type: Manual Wheelchair activity did not occur: Safety/medical concerns Max wheelchair distance: 70 Assist Level: Touching or steadying assistance (Pt > 75%) Wheel 50 feet with 2 turns activity did not occur: Safety/medical concerns Assist Level: Touching or steadying assistance (Pt > 75%) Wheel 150 feet activity did not occur: Safety/medical concerns Assist Level: Touching or steadying assistance (Pt > 75%) Turns around,maneuvers to table,bed, and toilet,negotiates 3% grade,maneuvers on rugs and over  doorsills: No Function - Locomotion: Ambulation Ambulation activity did not occur: Safety/medical concerns Assistive device: Walker-rolling, Orthosis Max distance: 60 ft Assist level: 2 helpers(pt mod assist) Walk 10 feet activity did not occur: Safety/medical concerns Assist level: 2 helpers Walk 50 feet with 2 turns activity did not occur: Safety/medical concerns Assist level: 2 helpers Walk 150 feet activity did not occur: Safety/medical concerns Walk 10 feet on uneven surfaces activity did not occur: Safety/medical concerns  Function - Comprehension Comprehension: Auditory Comprehension assistive device: Hearing aids Comprehension assist level: Understands basic 75 - 89% of the time/ requires cueing 10 - 24% of the time  Function - Expression Expression: Verbal Expression assist level: Expresses basic 50 - 74% of the time/requires cueing 25 - 49% of the time. Needs to repeat parts of sentences.  Function - Social Interaction Social Interaction assist level: Interacts appropriately 50 - 74% of the time - May be physically or verbally inappropriate.  Function - Problem Solving Problem solving assist level: Solves basic 25 - 49% of the time - needs direction more than half the time to initiate, plan or complete simple activities  Function - Memory Memory assist level: Recognizes or recalls 25 - 49% of the time/requires cueing 50 - 75% of the time Patient normally able to recall (first 3 days only): None of the above   Medical Problem List and Plan: 1.  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  Cont CIR-PT, OT, SLP 2.  DVT Prophylaxis/Anticoagulation:  Eliquis  will serve as DVT prophylaxis as well 3. Pain Management: tylenol prn.  4. Mood: Team to provide ego support to help with frustration.  LCSW to follow for evaluation and support when appropriate.  On Prozac 5. Neuropsych: This patient is not capable of making decisions on his own  behalf. 6. Skin/Wound Care: routine pressure relief measures.  7. Fluids/Electrolytes/Nutrition: Monitor I/Os. Added full supervision at meals for safety., BMET within acceptable range on 5/27 and 6/3, I 975m recorded with meals yesterday8. PAF/CAD/HTN: Monitor HR bid. ON metoprolol and Lipitor.  Vitals:   03/17/18 2127 03/18/18 0457  BP: 138/71 (!) 136/59  Pulse: 60 (!) 51  Resp: 16 12  Temp: 97.9 F (36.6 C) 97.7 F (36.5 C)  SpO2: 98% 95%   low dose amlodipine started 5/28 Good  control 6/10 9. T2DM: Hgb A1C- 7.1.  Monitor BS ac/hs and use SSI for elevated BS. Metformin d/ced due to GI Side effects  CBG (last 3)  Recent Labs    03/17/18 1624 03/17/18 2126 03/18/18 0642  GLUCAP 167* 148* 127*   Reasonable control 6/10, continue SSI 10. Myasthenia Gravis with intermittent diplopia: On mestonin 30 mg bid--to be increased to 60 mg bid per discharge summary, daughter doesn't want him to have this, had EMG confirmation. Monitor for fatiguability, ocular symptoms--- but appears to be tolerating therapy thus far,   -Consider revisiting treatments with daughter if further symptoms arise as pt cannot make a decision, pt also cannot provide subjective symptoms 11. Glaucoma: Continue Ocuvite bid and  Xalatan at bedtime.  12.  Hypoalb- prostat 13.  Insomnia- improved    LOS (Days) 19 A FACE TO FACE EVALUATION WAS PERFORMED  Charlett Blake 03/18/2018, 8:48 AM

## 2018-03-19 ENCOUNTER — Inpatient Hospital Stay (HOSPITAL_COMMUNITY): Payer: PPO | Admitting: Physical Therapy

## 2018-03-19 ENCOUNTER — Inpatient Hospital Stay (HOSPITAL_COMMUNITY): Payer: PPO | Admitting: Speech Pathology

## 2018-03-19 ENCOUNTER — Inpatient Hospital Stay (HOSPITAL_COMMUNITY): Payer: PPO

## 2018-03-19 ENCOUNTER — Inpatient Hospital Stay (HOSPITAL_COMMUNITY): Payer: PPO | Admitting: Occupational Therapy

## 2018-03-19 LAB — GLUCOSE, CAPILLARY
GLUCOSE-CAPILLARY: 117 mg/dL — AB (ref 65–99)
GLUCOSE-CAPILLARY: 166 mg/dL — AB (ref 65–99)
Glucose-Capillary: 124 mg/dL — ABNORMAL HIGH (ref 65–99)
Glucose-Capillary: 203 mg/dL — ABNORMAL HIGH (ref 65–99)

## 2018-03-19 NOTE — Progress Notes (Signed)
Occupational Therapy Session Note  Patient Details  Name: Kyle Richardson MRN: [id number] Date of Birth: 02-04-1928  Today's Date: 03/19/2018 OT Individual Time: 1102-1200 OT Individual Time Calculation (min): 58 min    Short Term Goals: Week 3:  OT Short Term Goal 1 (Week 3): Pt will complete toilet transfer with Mod A OT Short Term Goal 2 (Week 3): Pt will use R UE functionally to was chest with no more than min guided A OT Short Term Goal 3 (Week 3): Pt will locate 2/4 grooming tasks on R side of the sink with no more than mod instructional cues  Skilled Therapeutic Interventions/Progress Updates:    Pt greeted sitting in wc and agreeable to OT treatment session. Pt brought to sink in wc for shaving task. Worked on head turn strategies for vision to see where pt was shaving at. Only used L UE for safety 2/2 R UE ataxia and apraxia. Blocked practice for walk-in shower transfer using grab bars and tub transfer bench. Had pt locate tub bench on R side with overt head turn prior to transfer. Pt able to power up, then needed mod A to facilitate head/hips relationship to turn and sit on bench. Practiced 4x in and out of shower with improvement noted after repetitions. Pt brought to therapy gym and worked on visual scanning and functional use of R UE. Began activity with clothes pin task, but pt had difficulty motor planning pinching and reaching at the same time. Graded activity to grasp/release cup. Placed orange "x" with tape midline on table to provide visual cue for where to stack cup. Had pt look to find his R hand on R side, then reach up to the R to grasp cup prior to placing it on the table. With repetition, pt with improved motor control and less over/under shooting to locate cup. Pt returned to room and left seated in TIS wc with safety belt on and gray call button placed on L side.   Therapy Documentation Precautions:  Precautions Precautions: Fall Precaution Comments: R hemiplegia,  increased R hamstring tone Restrictions Weight Bearing Restrictions: No Pain:  none/denies pain ADL: ADL ADL Comments: Please see functional navigator   See Function Navigator for Current Functional Status.   Therapy/Group: Individual Therapy  Valma Cava 03/19/2018, 11:16 AM

## 2018-03-19 NOTE — Progress Notes (Signed)
Physical Therapy Session Note  Patient Details  Name: Kyle Richardson MRN: [id number] Date of Birth: June 20, 1928  Today's Date: 03/19/2018 PT Individual Time: 0905-0930 PT Individual Time Calculation (min): 25 min   Short Term Goals: Week 3:  PT Short Term Goal 1 (Week 3): Pt will perform bed<>chair transfer w/ mod assist consistently PT Short Term Goal 2 (Week 3): Pt will self-propel manual w/c 25' w/ supervision PT Short Term Goal 3 (Week 3): Pt will initiate stair training  Skilled Therapeutic Interventions/Progress Updates:   RN present upon arrival and stating pt needed to toilet. Pt agreeable to therapy and session focused on functional toilet transfer. Performed stedy transfer to/from toilet for time management, pt performing multiple sit<>stands during transfers and pericare w/ min assist only for RUE/LE management and safety. Otherwise supervision to boost into standing. Max assist to assist pt w/ pericare and LE garment management. Encouraged pt to identify his needs and sequence the task appropriately, however max cues for sequencing. Pt w/ continent BM and returned to [place] after toileting. Ended session in TIS, call bell in L hand and all needs met.  Therapy Documentation Precautions:  Precautions Precautions: Fall Precaution Comments: R hemiplegia, increased R hamstring tone Restrictions Weight Bearing Restrictions: No Vital Signs: Therapy Vitals Temp: 98 F (36.7 C) Temp Source: Oral Pulse Rate: 65 Resp: 18 BP: 115/62 Patient Position (if appropriate): Sitting Oxygen Therapy SpO2: 96 % O2 Device: Room Air Pain: Pain Assessment Pain Scale: Faces Faces Pain Scale: No hurt  See Function Navigator for Current Functional Status.   Therapy/Group: Individual Therapy  Makayleigh Poliquin K Arnette 03/19/2018, 3:59 PM

## 2018-03-19 NOTE — Progress Notes (Signed)
Subjective/Complaints: Eating breakfast with supervision, good appetite this am , no c/os  ROS: Limited due to cognitive/behavioral    Objective: Vital Signs: Blood pressure 131/75, pulse (!) 50, temperature (!) 97.4 F (36.3 C), temperature source Oral, resp. rate 17, height '5\' 8"'  (1.727 m), weight 73.2 kg (161 lb 6 oz), SpO2 94 %. No results found. Results for orders placed or performed during the hospital encounter of 02/27/18 (from the past 72 hour(s))  Glucose, capillary     Status: Abnormal   Collection Time: 03/16/18 11:25 AM  Result Value Ref Range   Glucose-Capillary 169 (H) 65 - 99 mg/dL  Glucose, capillary     Status: Abnormal   Collection Time: 03/16/18  4:53 PM  Result Value Ref Range   Glucose-Capillary 123 (H) 65 - 99 mg/dL  Glucose, capillary     Status: Abnormal   Collection Time: 03/16/18  9:05 PM  Result Value Ref Range   Glucose-Capillary 170 (H) 65 - 99 mg/dL   Comment 1 Notify RN   Glucose, capillary     Status: Abnormal   Collection Time: 03/16/18  9:49 PM  Result Value Ref Range   Glucose-Capillary 142 (H) 65 - 99 mg/dL  Glucose, capillary     Status: Abnormal   Collection Time: 03/17/18  6:50 AM  Result Value Ref Range   Glucose-Capillary 130 (H) 65 - 99 mg/dL   Comment 1 Notify RN   Glucose, capillary     Status: Abnormal   Collection Time: 03/17/18 11:32 AM  Result Value Ref Range   Glucose-Capillary 155 (H) 65 - 99 mg/dL  Glucose, capillary     Status: Abnormal   Collection Time: 03/17/18  4:24 PM  Result Value Ref Range   Glucose-Capillary 167 (H) 65 - 99 mg/dL  Glucose, capillary     Status: Abnormal   Collection Time: 03/17/18  9:26 PM  Result Value Ref Range   Glucose-Capillary 148 (H) 65 - 99 mg/dL  CBC     Status: None   Collection Time: 03/18/18  5:21 AM  Result Value Ref Range   WBC 5.9 4.0 - 10.5 K/uL   RBC 4.47 4.22 - 5.81 MIL/uL   Hemoglobin 14.1 13.0 - 17.0 g/dL   HCT 43.3 39.0 - 52.0 %   MCV 96.9 78.0 - 100.0 fL   MCH  31.5 26.0 - 34.0 pg   MCHC 32.6 30.0 - 36.0 g/dL   RDW 13.2 11.5 - 15.5 %   Platelets 176 150 - 400 K/uL    Comment: Performed at [facility], [address], [place], [place] [address]  Basic metabolic panel     Status: Abnormal   Collection Time: 03/18/18  5:21 AM  Result Value Ref Range   Sodium 141 135 - 145 mmol/L   Potassium 3.9 3.5 - 5.1 mmol/L   Chloride 108 101 - 111 mmol/L   CO2 26 22 - 32 mmol/L   Glucose, Bld 127 (H) 65 - 99 mg/dL   BUN 20 6 - 20 mg/dL   Creatinine, Ser 0.84 0.61 - 1.24 mg/dL   Calcium 8.7 (L) 8.9 - 10.3 mg/dL   GFR calc non Af Amer >60 >60 mL/min   GFR calc Af Amer >60 >60 mL/min    Comment: (NOTE) The eGFR has been calculated using the CKD EPI equation. This calculation has not been validated in all clinical situations. eGFR's persistently <60 mL/min signify possible Chronic Kidney Disease.    Anion gap 7 5 -  15    Comment: Performed at [facility], [place] [address], [place], [place] [address]  Glucose, capillary     Status: Abnormal   Collection Time: 03/18/18  6:42 AM  Result Value Ref Range   Glucose-Capillary 127 (H) 65 - 99 mg/dL  Glucose, capillary     Status: Abnormal   Collection Time: 03/18/18 11:55 AM  Result Value Ref Range   Glucose-Capillary 169 (H) 65 - 99 mg/dL  Glucose, capillary     Status: Abnormal   Collection Time: 03/18/18  5:26 PM  Result Value Ref Range   Glucose-Capillary 129 (H) 65 - 99 mg/dL  Glucose, capillary     Status: Abnormal   Collection Time: 03/18/18 10:26 PM  Result Value Ref Range   Glucose-Capillary 165 (H) 65 - 99 mg/dL   Comment 1 Notify RN   Glucose, capillary     Status: Abnormal   Collection Time: 03/19/18  7:04 AM  Result Value Ref Range   Glucose-Capillary 117 (H) 65 - 99 mg/dL   Comment 1 Notify RN      Constitutional: No distress . Vital signs reviewed. HEENT: EOMI, oral membranes moist Neck: supple Cardiovascular: RRR without murmur. No JVD    Respiratory: CTA Bilaterally  without wheezes or rales. Normal effort    GI: BS +, non-tender, non-distended  Musc: No edema or tenderness in extremities. Neuro: Alert.  Word finding deficits Motor: Limited due to participation and apraxia ?RUE: 0/5 proximal distal ?RLE: Hip flexion, knee extension, ADF 4/5 ?LLE: 4+/5 proximal to distal  .   Assessment/Plan: 1. Functional deficits secondary to left PCA infarct with right hemiparesis, receptive aphasia which require 3+ hours per day of interdisciplinary therapy in a comprehensive inpatient rehab setting. Physiatrist is providing close team supervision and 24 hour management of active medical problems listed below. Physiatrist and rehab team continue to assess barriers to discharge/monitor patient progress toward functional and medical goals. FIM: Function - Bathing Position: Wheelchair/chair at sink Body parts bathed by patient: Chest, Abdomen, Front perineal area, Right arm, Left arm, Right upper leg, Left upper leg, Right lower leg, Left lower leg, Buttocks Body parts bathed by helper: Back Assist Level: Touching or steadying assistance(Pt > 75%)  Function- Upper Body Dressing/Undressing What is the patient wearing?: Pull over shirt/dress Pull over shirt/dress - Perfomed by patient: Thread/unthread left sleeve, Put head through opening Pull over shirt/dress - Perfomed by helper: Thread/unthread right sleeve, Pull shirt over trunk Assist Level: Touching or steadying assistance(Pt > 75%) Function - Lower Body Dressing/Undressing What is the patient wearing?: Pants, Socks, Shoes Position: Sitting EOB Underwear - Performed by helper: Thread/unthread right underwear leg, Thread/unthread left underwear leg, Pull underwear up/down Pants- Performed by patient: Thread/unthread left pants leg, Pull pants up/down Pants- Performed by helper: Thread/unthread right pants leg Non-skid slipper socks- Performed by helper: Don/doff right sock, Don/doff left sock Socks -  Performed by helper: Don/doff right sock, Don/doff left sock Shoes - Performed by helper: Don/doff right shoe, Don/doff left shoe, Fasten right, Fasten left Assist for footwear: Partial/moderate assist Assist for lower body dressing: Touching or steadying assistance (Pt > 75%)  Function - Toileting Toileting activity did not occur: No continent bowel/bladder event Toileting steps completed by helper: Adjust clothing prior to toileting, Performs perineal hygiene, Adjust clothing after toileting Assist level: Touching or steadying assistance (Pt.75%)  Function - Air cabin crew transfer activity did not occur: Safety/medical concerns Toilet transfer assistive device: Mechanical lift Mechanical lift: Stedy Assist level to  toilet: 2 helpers Assist level from toilet: Maximal assist (Pt 25 - 49%/lift and lower)  Function - Chair/bed transfer Chair/bed transfer method: Stand pivot Chair/bed transfer assist level: Moderate assist (Pt 50 - 74%/lift or lower) Chair/bed transfer assistive device: Armrests, Walker Mechanical lift: Stedy Chair/bed transfer details: Manual facilitation for placement, Manual facilitation for weight shifting, Verbal cues for sequencing, Verbal cues for technique  Function - Locomotion: Wheelchair Will patient use wheelchair at discharge?: Yes Type: Manual Wheelchair activity did not occur: Safety/medical concerns Max wheelchair distance: 70 Assist Level: Touching or steadying assistance (Pt > 75%) Wheel 50 feet with 2 turns activity did not occur: Safety/medical concerns Assist Level: Touching or steadying assistance (Pt > 75%) Wheel 150 feet activity did not occur: Safety/medical concerns Assist Level: Touching or steadying assistance (Pt > 75%) Turns around,maneuvers to table,bed, and toilet,negotiates 3% grade,maneuvers on rugs and over doorsills: No Function - Locomotion: Ambulation Ambulation activity did not occur: Safety/medical concerns Assistive  device: Walker-rolling, Orthosis Max distance: 30 ft Assist level: Maximal assist (Pt 25 - 49%) Walk 10 feet activity did not occur: Safety/medical concerns Assist level: Maximal assist (Pt 25 - 49%) Walk 50 feet with 2 turns activity did not occur: Safety/medical concerns Assist level: 2 helpers Walk 150 feet activity did not occur: Safety/medical concerns Walk 10 feet on uneven surfaces activity did not occur: Safety/medical concerns  Function - Comprehension Comprehension: Auditory Comprehension assistive device: Hearing aids Comprehension assist level: Understands basic 75 - 89% of the time/ requires cueing 10 - 24% of the time  Function - Expression Expression: Verbal Expression assist level: Expresses basic 50 - 74% of the time/requires cueing 25 - 49% of the time. Needs to repeat parts of sentences.  Function - Social Interaction Social Interaction assist level: Interacts appropriately 50 - 74% of the time - May be physically or verbally inappropriate.  Function - Problem Solving Problem solving assist level: Solves basic 25 - 49% of the time - needs direction more than half the time to initiate, plan or complete simple activities  Function - Memory Memory assist level: Recognizes or recalls 25 - 49% of the time/requires cueing 50 - 75% of the time Patient normally able to recall (first 3 days only): None of the above   Medical Problem List and Plan: 1.  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  Cont CIR-PT, OT, SLP- team conf in am 2.  DVT Prophylaxis/Anticoagulation:  Eliquis  will serve as DVT prophylaxis as well 3. Pain Management: tylenol prn.  4. Mood: Team to provide ego support to help with frustration.  LCSW to follow for evaluation and support when appropriate.  On Prozac-neuropsych f/u 5. Neuropsych: This patient is not capable of making decisions on his own behalf. 6. Skin/Wound Care: routine pressure relief measures.  7.  Fluids/Electrolytes/Nutrition: Monitor I/Os. Added full supervision at meals for safety., BMET within acceptable range on 5/27 and 6/3, I 921m recorded with meals yesterday8. PAF/CAD/HTN: Monitor HR bid. ON metoprolol and Lipitor.  Vitals:   03/18/18 2229 03/19/18 0511  BP: 131/60 131/75  Pulse: (!) 54 (!) 50  Resp: 16 17  Temp: (!) 97.5 F (36.4 C) (!) 97.4 F (36.3 C)  SpO2: 95% 94%   low dose amlodipine started 5/28 Good  control 6/11, asymptomatic brady due to BB 9. T2DM: Hgb A1C- 7.1.  Monitor BS ac/hs and use SSI for elevated BS. Metformin d/ced due to GI Side effects CBG (last 3)  Recent Labs  03/18/18 1726 03/18/18 2226 03/19/18 0704  GLUCAP 129* 165* 117*   Good  control 6/11, continue SSI 10. Myasthenia Gravis with intermittent diplopia: On mestonin 30 mg bid--to be increased to 60 mg bid per discharge summary, daughter doesn't want him to have this, had EMG confirmation. Monitor for fatiguability, ocular symptoms--- but appears to be tolerating therapy thus far,   -Consider revisiting treatments with daughter if further symptoms arise as pt cannot make a decision, pt also cannot provide subjective symptoms 11. Glaucoma: Continue Ocuvite bid and  Xalatan at bedtime.  12.  Hypoalb- prostat 13.  Insomnia- improved    LOS (Days) 20 A FACE TO FACE EVALUATION WAS PERFORMED  Charlett Blake 03/19/2018, 8:26 AM

## 2018-03-19 NOTE — Progress Notes (Signed)
Speech Language Pathology Daily Session Note  Patient Details  Name: Kyle Richardson MRN: [id number] Date of Birth: [birth year]  Today's Date: 03/19/2018 SLP Individual Time: 1015-1100 SLP Individual Time Calculation (min): 45 min  Short Term Goals: Week 3: SLP Short Term Goal 1 (Week 3): Patient will attend to right field of enviornment during functional tasks with Min A multimodal cues.  SLP Short Term Goal 2 (Week 3): Patient will demonstrate selective attention to tasks for ~15 minutes with Min A verbal cues for redirection in a mildly distracting enviornment.  SLP Short Term Goal 3 (Week 3): Patient will demonstrate functional problem solving for basic and familiar tasks with Mod A verbal cues.  SLP Short Term Goal 4 (Week 3): Patient will self-monitor and correct verbal errors at the phrase level during strutured tasks with Min A verbal cues.  SLP Short Term Goal 5 (Week 3): Patient will decode at the word level with Max A multimodal cues.  SLP Short Term Goal 6 (Week 3): Patient will follow 2-step commands with Mod A verbal cues.   Skilled Therapeutic Interventions: Skilled treatment session focused on cognitive-linguistic goals. SLP facilitated session by providing Min A semantic cues to name functional items and Min-Mod A verbal cues to identify the function of 2 or 3 items within a group. Patient required Mod A verbal cues to self-monitor and correct verbal perseveration throughout task. At end of session, patient required reinforcement in regards to how to utilize soft call bell to request assistance as needed. Patient left upright in wheelchair with all needs within reach. Continue with current plan of care.      Function:    Cognition Comprehension Comprehension assist level: Understands basic 75 - 89% of the time/ requires cueing 10 - 24% of the time  Expression   Expression assist level: Expresses basic 50 - 74% of the time/requires cueing 25 - 49% of the time. Needs to repeat  parts of sentences.  Social Interaction Social Interaction assist level: Interacts appropriately 50 - 74% of the time - May be physically or verbally inappropriate.  Problem Solving Problem solving assist level: Solves basic 25 - 49% of the time - needs direction more than half the time to initiate, plan or complete simple activities  Memory Memory assist level: Recognizes or recalls 25 - 49% of the time/requires cueing 50 - 75% of the time    Pain Pain Assessment Pain Scale: Faces Faces Pain Scale: No hurt  Therapy/Group: Individual Therapy  [name] 03/19/2018, 2:38 PM

## 2018-03-19 NOTE — Progress Notes (Signed)
Physical Therapy Session Note  Patient Details  Name: Kyle Richardson MRN: [id number] Date of Birth: September 03, 1928  Today's Date: 03/19/2018 PT Individual Time: 1400-1500 PT Individual Time Calculation (min): 60 min   Short Term Goals: Week 3:  PT Short Term Goal 1 (Week 3): Pt will perform bed<>chair transfer w/ mod assist consistently PT Short Term Goal 2 (Week 3): Pt will self-propel manual w/c 25' w/ supervision PT Short Term Goal 3 (Week 3): Pt will initiate stair training  Skilled Therapeutic Interventions/Progress Updates:    Pt seated in TIS w/c upon PT arrival, agreeable to therapy tx and denies pain. Pt transported to dayroom. Pt used dynavision this session while in standing to work on R UE coordination, visual scanning and standing balance. 2 trials x 3 min each, pt with decreased reaction time on R side secondary to visual field deficits and R UE ataxia. Pt transported to gym. Pt ambulated 2 x 80 ft this session with RW and R hand orthosis, occasional verbal cues for R foot placement and awareness, assist for RW management/steering, mod assist overall. Pt worked on R LE coordination and NMR to work on seated toe taps to colored dots, kicking ball back and forth and tapping small ball with foot. Pt worked on standing balance and R LE coordination to kick cones with R LE while standing with RW, verbal cues for controlled kicking. Pt transported back to room and left seated in TIS with needs in reach, QRB in place.   Therapy Documentation Precautions:  Precautions Precautions: Fall Precaution Comments: R hemiplegia, increased R hamstring tone Restrictions Weight Bearing Restrictions: No   See Function Navigator for Current Functional Status.   Therapy/Group: Individual Therapy  Netta Corrigan, PT, DPT 03/19/2018, 12:06 PM

## 2018-03-20 ENCOUNTER — Inpatient Hospital Stay (HOSPITAL_COMMUNITY): Payer: PPO | Admitting: Speech Pathology

## 2018-03-20 ENCOUNTER — Inpatient Hospital Stay (HOSPITAL_COMMUNITY): Payer: PPO | Admitting: Occupational Therapy

## 2018-03-20 ENCOUNTER — Inpatient Hospital Stay (HOSPITAL_COMMUNITY): Payer: PPO

## 2018-03-20 LAB — GLUCOSE, CAPILLARY
Glucose-Capillary: 111 mg/dL — ABNORMAL HIGH (ref 65–99)
Glucose-Capillary: 158 mg/dL — ABNORMAL HIGH (ref 65–99)
Glucose-Capillary: 124 mg/dL — ABNORMAL HIGH (ref 65–99)
Glucose-Capillary: 149 mg/dL — ABNORMAL HIGH (ref 65–99)

## 2018-03-20 NOTE — Progress Notes (Signed)
Occupational Therapy Weekly Progress Note  Patient Details  Name: Kyle Richardson MRN: [id number] Date of Birth: June 18, 1928  Beginning of progress report period: Feb 28, 2018 End of progress report period: March 20, 2018  Today's Date: 03/20/2018 OT Individual Time: 7564-3329 OT Individual Time Calculation (min): 71 min    Patient has met 3 of 3 short term goals.  Pt is making steady progress towards OT goals at this time. He has demonstrated improved R attention to locate items on R side of body. He still needs min/mod assist for integration of the RUE at a diminished level for washing the LUE.  He can use the RUE with min assist for holding soap and deodorant for opening and for applying it to the washcloth.  He can active digit flexion and extension to full AROM with supination and pronation as well.  He does exhibit increased apraxia when attempting to use the UE and increased wrist and digit flexion overpower all movements.  Sit to stand transitions are at a min/supervision with LB selfcare, but he still needs mod A with transfers 2/2 apraxia and difficulty motor planning transitional movements. Continue current POC.    Patient continues to demonstrate the following deficits: muscle weakness, impaired timing and sequencing, abnormal tone, unbalanced muscle activation, motor apraxia, ataxia, decreased coordination and decreased motor planning, field cut, decreased attention to right, right side neglect and decreased motor planning, decreased initiation, decreased attention, decreased awareness, decreased problem solving, decreased safety awareness, decreased memory and delayed processing and decreased sitting balance, decreased standing balance, decreased postural control, hemiplegia and decreased balance strategies and therefore will continue to benefit from skilled OT intervention to enhance overall performance with BADL and Reduce care partner burden.  Patient progressing toward long term goals..   Continue plan of care.  OT Short Term Goals Week 3:  OT Short Term Goal 1 (Week 3): Pt will complete toilet transfer with Mod A OT Short Term Goal 1 - Progress (Week 3): Met OT Short Term Goal 2 (Week 3): Pt will use R UE functionally to was chest with no more than min guided A OT Short Term Goal 2 - Progress (Week 3): Met OT Short Term Goal 3 (Week 3): Pt will locate 2/4 grooming tasks on R side of the sink with no more than mod instructional cues OT Short Term Goal 3 - Progress (Week 3): Met Week 4:  OT Short Term Goal 1 (Week 4): STG=LTG 2/2 ELOS  Skilled Therapeutic Interventions/Progress Updates:    Pt greeted semi-reclined in bed and agreeable to OT. Pt completed stand-pivot bed>wc on L side with mod A and instructional cues for motor planning transitional movement. Brought pt into bathroom and had pt locate tub bench on R side prior to transfer. Pt then completed stand-pivot onto shower using grab bars and mod A. OT had pt assist with donning wash mit to R UE as pt is unable to maintain grasp on wash cloth 2/2 apraxia. Pt able to utilize R UE within 40% of bathing tasks with only verbal cues. Improved accuracy with washing body parts using R UE. Worked on anterior weight shift with leaning forward to wash LEs and R attention to look to the R side of body before washing those parts. Pt transferred out of shower to the L with only min A and use of grab bars. Dressing completed wc level at the sink with pt able to thread B LEs into pants today with OT orienting pants and setting it up.  He then completed supervision sit<>stand and was able to maintain balance with CGA while pulling up his pants. OT provided pt with shoe buttons and educated pt on how to fasten his shoes. Pt continued to have difficulty 2/2 apraxia and vision impairments, but was able to locate shoe button and fasten shoe with repetition. Pt left seated in wc with safety belt on and lunch set-up.  Therapy Documentation Precautions:   Precautions Precautions: Fall Precaution Comments: R hemiplegia, increased R hamstring tone Restrictions Weight Bearing Restrictions: No Pain: Pain Assessment Pain Scale: Faces Faces Pain Scale: No hurt ADL: ADL ADL Comments: Please see functional navigator   See Function Navigator for Current Functional Status.   Therapy/Group: Individual Therapy  Valma Cava 03/20/2018, 1:04 PM

## 2018-03-20 NOTE — Progress Notes (Signed)
Physical Therapy Session Note  Patient Details  Name: Kyle Richardson MRN: [id number] Date of Birth: 1928/07/06  Today's Date: 03/20/2018 PT Individual Time: 1401-1515 PT Individual Time Calculation (min): 74 min   Short Term Goals: Week 3:  PT Short Term Goal 1 (Week 3): Pt will perform bed<>chair transfer w/ mod assist consistently PT Short Term Goal 2 (Week 3): Pt will self-propel manual w/c 25' w/ supervision PT Short Term Goal 3 (Week 3): Pt will initiate stair training  Skilled Therapeutic Interventions/Progress Updates:    Pt seated in TIS w/c upon PT arrival, agreeable to therapy tx and denies pain. Pt transported to the gym. Pt ascended/descended 1 step with L handrail, max assist with increased pushing to the R noted. Pt ambulated x 42 ft with RW and mod assist, pt then scissored with R LE causing LOB and total assist to prevent fall. Pt performed stand pivot transfer to mat with mod assist. Pt performed x 10 sit<>stands from mat blocked practice with min assist and verbal cues for techniques. Pt worked on R LE coordination while seated edge of mat to kick ball back and forth x 20. Pt performed blocked practice of stand pivot transfers from w/c<>mat x 4 in each direction, min assist when transferred towards the L and mod-max assist when transferring to the R secondary to sensory deficits, poor motor planning and R visual field deficits. Pt worked on standing balance, R UE coordination and neuro re-ed in order to grab bean bags on R side and place in bucket, hand over hand assist to guide R arm to find bean bags, x 2 trials. Pt performed stand pivot transfer back to w/c with mod assist and transported to dayroom. Pt used kinetron x 5 minutes for reciprocal LE movement and neuro re-ed on 50 cm/sec. Pt transported back to room and left seated in TIS with QRB in place, chair alarm set and needs in reach.   Therapy Documentation Precautions:  Precautions Precautions: Fall Precaution Comments:  R hemiplegia, increased R hamstring tone Restrictions Weight Bearing Restrictions: No   See Function Navigator for Current Functional Status.   Therapy/Group: Individual Therapy  Netta Corrigan, PT, DPT 03/20/2018, 7:55 AM

## 2018-03-20 NOTE — Progress Notes (Signed)
Speech Language Pathology Daily Session Note  Patient Details  Name: Kyle Richardson MRN: [id number] Date of Birth: [birth year]  Today's Date: 03/20/2018 SLP Individual Time: 0815-0900 SLP Individual Time Calculation (min): 45 min  Short Term Goals: Week 3: SLP Short Term Goal 1 (Week 3): Patient will attend to right field of enviornment during functional tasks with Min A multimodal cues.  SLP Short Term Goal 2 (Week 3): Patient will demonstrate selective attention to tasks for ~15 minutes with Min A verbal cues for redirection in a mildly distracting enviornment.  SLP Short Term Goal 3 (Week 3): Patient will demonstrate functional problem solving for basic and familiar tasks with Mod A verbal cues.  SLP Short Term Goal 4 (Week 3): Patient will self-monitor and correct verbal errors at the phrase level during strutured tasks with Min A verbal cues.  SLP Short Term Goal 5 (Week 3): Patient will decode at the word level with Max A multimodal cues.  SLP Short Term Goal 6 (Week 3): Patient will follow 2-step commands with Mod A verbal cues.   Skilled Therapeutic Interventions: Skilled treatment session focused on cognitive-linguistic goals. SLP facilitated session by providing Min A verbal cues for problem solving during tray set-up/self-feeding and overall Mod A verbal cues for patient to locate items on his tray within his right visual field. Patient demonstrated selective attention to self-feeding for ~30 minutes with Mod I. Patient also consumed medications whole in puree without difficulty due to improved cognitive-linguistic function, therefore, recommend patient upgrade to medications whole in applesauce. Patient left upright in bed with all needs within reach and alarm on. Continue with current plan of care.      Function:  Eating Eating   Modified Consistency Diet: No Eating Assist Level: Set up assist for;More than reasonable amount of time   Eating Set Up Assist For: Opening  containers;Cutting food       Cognition Comprehension Comprehension assist level: Understands basic 75 - 89% of the time/ requires cueing 10 - 24% of the time  Expression   Expression assist level: Expresses basic 50 - 74% of the time/requires cueing 25 - 49% of the time. Needs to repeat parts of sentences.  Social Interaction Social Interaction assist level: Interacts appropriately 50 - 74% of the time - May be physically or verbally inappropriate.  Problem Solving Problem solving assist level: Solves basic 25 - 49% of the time - needs direction more than half the time to initiate, plan or complete simple activities  Memory Memory assist level: Recognizes or recalls 25 - 49% of the time/requires cueing 50 - 75% of the time    Pain No/Denies Pain   Therapy/Group: Individual Therapy  Jamilynn Whitacre 03/20/2018, 9:56 AM

## 2018-03-20 NOTE — Progress Notes (Addendum)
Subjective/Complaints:  Patient remains aphasic although his communication is now at a phrase level.  Partially compensates for right field cut.  ROS: Limited due to cognitive/behavioral    Objective: Vital Signs: Blood pressure (!) 122/55, pulse (!) 51, temperature 98 F (36.7 C), temperature source Oral, resp. rate 18, height '5\' 8"'  (1.727 m), weight 73.2 kg (161 lb 6 oz), SpO2 97 %. No results found. Results for orders placed or performed during the hospital encounter of 02/27/18 (from the past 72 hour(s))  Glucose, capillary     Status: Abnormal   Collection Time: 03/17/18 11:32 AM  Result Value Ref Range   Glucose-Capillary 155 (H) 65 - 99 mg/dL  Glucose, capillary     Status: Abnormal   Collection Time: 03/17/18  4:24 PM  Result Value Ref Range   Glucose-Capillary 167 (H) 65 - 99 mg/dL  Glucose, capillary     Status: Abnormal   Collection Time: 03/17/18  9:26 PM  Result Value Ref Range   Glucose-Capillary 148 (H) 65 - 99 mg/dL  CBC     Status: None   Collection Time: 03/18/18  5:21 AM  Result Value Ref Range   WBC 5.9 4.0 - 10.5 K/uL   RBC 4.47 4.22 - 5.81 MIL/uL   Hemoglobin 14.1 13.0 - 17.0 g/dL   HCT 43.3 39.0 - 52.0 %   MCV 96.9 78.0 - 100.0 fL   MCH 31.5 26.0 - 34.0 pg   MCHC 32.6 30.0 - 36.0 g/dL   RDW 13.2 11.5 - 15.5 %   Platelets 176 150 - 400 K/uL    Comment: Performed at [facility], [place] [address], [place], [place] [address]  Basic metabolic panel     Status: Abnormal   Collection Time: 03/18/18  5:21 AM  Result Value Ref Range   Sodium 141 135 - 145 mmol/L   Potassium 3.9 3.5 - 5.1 mmol/L   Chloride 108 101 - 111 mmol/L   CO2 26 22 - 32 mmol/L   Glucose, Bld 127 (H) 65 - 99 mg/dL   BUN 20 6 - 20 mg/dL   Creatinine, Ser 0.84 0.61 - 1.24 mg/dL   Calcium 8.7 (L) 8.9 - 10.3 mg/dL   GFR calc non Af Amer >60 >60 mL/min   GFR calc Af Amer >60 >60 mL/min    Comment: (NOTE) The eGFR has been calculated using the CKD EPI equation. This calculation  has not been validated in all clinical situations. eGFR's persistently <60 mL/min signify possible Chronic Kidney Disease.    Anion gap 7 5 - 15    Comment: Performed at [facility] [address] [place], [place] [address]  Glucose, capillary     Status: Abnormal   Collection Time: 03/18/18  6:42 AM  Result Value Ref Range   Glucose-Capillary 127 (H) 65 - 99 mg/dL  Glucose, capillary     Status: Abnormal   Collection Time: 03/18/18 11:55 AM  Result Value Ref Range   Glucose-Capillary 169 (H) 65 - 99 mg/dL  Glucose, capillary     Status: Abnormal   Collection Time: 03/18/18  5:26 PM  Result Value Ref Range   Glucose-Capillary 129 (H) 65 - 99 mg/dL  Glucose, capillary     Status: Abnormal   Collection Time: 03/18/18 10:26 PM  Result Value Ref Range   Glucose-Capillary 165 (H) 65 - 99 mg/dL   Comment 1 Notify RN   Glucose, capillary     Status: Abnormal   Collection  Time: 03/19/18  7:04 AM  Result Value Ref Range   Glucose-Capillary 117 (H) 65 - 99 mg/dL   Comment 1 Notify RN   Glucose, capillary     Status: Abnormal   Collection Time: 03/19/18 11:37 AM  Result Value Ref Range   Glucose-Capillary 166 (H) 65 - 99 mg/dL  Glucose, capillary     Status: Abnormal   Collection Time: 03/19/18  5:10 PM  Result Value Ref Range   Glucose-Capillary 124 (H) 65 - 99 mg/dL  Glucose, capillary     Status: Abnormal   Collection Time: 03/19/18  8:57 PM  Result Value Ref Range   Glucose-Capillary 203 (H) 65 - 99 mg/dL  Glucose, capillary     Status: Abnormal   Collection Time: 03/20/18  6:17 AM  Result Value Ref Range   Glucose-Capillary 111 (H) 65 - 99 mg/dL     Constitutional: No distress . Vital signs reviewed. HEENT: EOMI, oral membranes moist Neck: supple Cardiovascular: RRR without murmur. No JVD    Respiratory: CTA Bilaterally without wheezes or rales. Normal effort    GI: BS +, non-tender, non-distended  Musc: No edema or tenderness in extremities. Neuro: Alert.   Word finding deficits Motor: Limited due to participation and apraxia ?RUE: 0/5 proximal distal ?RLE: Hip flexion, knee extension, ADF 4/5 ?LLE: 4+/5 proximal to distal  .   Assessment/Plan: 1. Functional deficits secondary to left PCA infarct with right hemiparesis, receptive aphasia which require 3+ hours per day of interdisciplinary therapy in a comprehensive inpatient rehab setting. Physiatrist is providing close team supervision and 24 hour management of active medical problems listed below. Physiatrist and rehab team continue to assess barriers to discharge/monitor patient progress toward functional and medical goals. FIM: Function - Bathing Position: Wheelchair/chair at sink Body parts bathed by patient: Chest, Abdomen, Front perineal area, Right arm, Left arm, Right upper leg, Left upper leg, Right lower leg, Left lower leg, Buttocks Body parts bathed by helper: Back Assist Level: Touching or steadying assistance(Pt > 75%)  Function- Upper Body Dressing/Undressing What is the patient wearing?: Pull over shirt/dress Pull over shirt/dress - Perfomed by patient: Thread/unthread left sleeve, Put head through opening Pull over shirt/dress - Perfomed by helper: Thread/unthread right sleeve, Pull shirt over trunk Assist Level: Touching or steadying assistance(Pt > 75%) Function - Lower Body Dressing/Undressing What is the patient wearing?: Pants, Socks, Shoes Position: Sitting EOB Underwear - Performed by helper: Thread/unthread right underwear leg, Thread/unthread left underwear leg, Pull underwear up/down Pants- Performed by patient: Thread/unthread left pants leg, Pull pants up/down Pants- Performed by helper: Thread/unthread right pants leg Non-skid slipper socks- Performed by helper: Don/doff right sock, Don/doff left sock Socks - Performed by helper: Don/doff right sock, Don/doff left sock Shoes - Performed by helper: Don/doff right shoe, Don/doff left shoe, Fasten right,  Fasten left Assist for footwear: Partial/moderate assist Assist for lower body dressing: Touching or steadying assistance (Pt > 75%)  Function - Toileting Toileting activity did not occur: No continent bowel/bladder event Toileting steps completed by helper: Adjust clothing prior to toileting, Performs perineal hygiene, Adjust clothing after toileting Assist level: Touching or steadying assistance (Pt.75%)  Function - Air cabin crew transfer activity did not occur: Safety/medical concerns Toilet transfer assistive device: Mechanical lift Mechanical lift: Stedy Assist level to toilet: 2 helpers Assist level from toilet: Maximal assist (Pt 25 - 49%/lift and lower)  Function - Chair/bed transfer Chair/bed transfer method: Stand pivot Chair/bed transfer assist level: Moderate assist (Pt 50 - 74%/lift or  lower) Chair/bed transfer assistive device: Armrests, Environmental manager lift: Stedy Chair/bed transfer details: Manual facilitation for placement, Manual facilitation for weight shifting, Verbal cues for sequencing, Verbal cues for technique  Function - Locomotion: Wheelchair Will patient use wheelchair at discharge?: Yes Type: Manual Wheelchair activity did not occur: Safety/medical concerns Max wheelchair distance: 70 Assist Level: Touching or steadying assistance (Pt > 75%) Wheel 50 feet with 2 turns activity did not occur: Safety/medical concerns Assist Level: Touching or steadying assistance (Pt > 75%) Wheel 150 feet activity did not occur: Safety/medical concerns Assist Level: Touching or steadying assistance (Pt > 75%) Turns around,maneuvers to table,bed, and toilet,negotiates 3% grade,maneuvers on rugs and over doorsills: No Function - Locomotion: Ambulation Ambulation activity did not occur: Safety/medical concerns Assistive device: Walker-rolling, Orthosis Max distance: 80 ft Assist level: Moderate assist (Pt 50 - 74%) Walk 10 feet activity did not occur:  Safety/medical concerns Assist level: Moderate assist (Pt 50 - 74%) Walk 50 feet with 2 turns activity did not occur: Safety/medical concerns Assist level: Moderate assist (Pt 50 - 74%) Walk 150 feet activity did not occur: Safety/medical concerns Walk 10 feet on uneven surfaces activity did not occur: Safety/medical concerns  Function - Comprehension Comprehension: Auditory Comprehension assistive device: Hearing aids Comprehension assist level: Understands basic 75 - 89% of the time/ requires cueing 10 - 24% of the time  Function - Expression Expression: Verbal Expression assist level: Expresses basic 50 - 74% of the time/requires cueing 25 - 49% of the time. Needs to repeat parts of sentences.  Function - Social Interaction Social Interaction assist level: Interacts appropriately 50 - 74% of the time - May be physically or verbally inappropriate.  Function - Problem Solving Problem solving assist level: Solves basic 25 - 49% of the time - needs direction more than half the time to initiate, plan or complete simple activities  Function - Memory Memory assist level: Recognizes or recalls 25 - 49% of the time/requires cueing 50 - 75% of the time Patient normally able to recall (first 3 days only): None of the above   Medical Problem List and Plan: 1.  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  Cont CIR-PT, OT, SLP-  Team conference today please see physician documentation under team conference tab, met with team face-to-face to discuss problems,progress, and goals. Formulized individual treatment plan based on medical history, underlying problem and comorbidities. 2.  DVT Prophylaxis/Anticoagulation:  Eliquis  will serve as DVT prophylaxis as well 3. Pain Management: tylenol prn.  4. Mood: Team to provide ego support to help with frustration.  LCSW to follow for evaluation and support when appropriate.  On Prozac-neuropsych f/u 5. Neuropsych:  This patient is not capable of making decisions on his own behalf. 6. Skin/Wound Care: routine pressure relief measures.  7. Fluids/Electrolytes/Nutrition: Monitor I/Os. Added full supervision at meals for safety., BMET within acceptable range on 5/27 and 6/3, I 923m recorded with meals yesterday8. PAF/CAD/HTN: Monitor HR bid. ON metoprolol and Lipitor.  Vitals:   03/19/18 1928 03/20/18 0437  BP: 127/63 (!) 122/55  Pulse: (!) 56 (!) 51  Resp: 18 18  Temp: 98 F (36.7 C) 98 F (36.7 C)  SpO2: 97% 97%   low dose amlodipine started 5/28 Good  control 6/12, asymptomatic brady due to BB 9. T2DM: Hgb A1C- 7.1.  Monitor BS ac/hs and use SSI for elevated BS. Metformin d/ced due to GI Side effects CBG (last 3)  Recent Labs    03/19/18 1710  03/19/18 2057 03/20/18 0617  GLUCAP 124* 203* 111*   Good  control 6/12, one spike on 6/11 continue SSI 10. Myasthenia Gravis with intermittent diplopia: On mestonin 30 mg bid--to be increased to 60 mg bid per discharge summary, daughter doesn't want him to have this, had EMG confirmation. Monitor for fatiguability, ocular symptoms--- but appears to be tolerating therapy thus far,   -Consider revisiting treatments with daughter if further symptoms arise as pt cannot make a decision, pt also cannot provide subjective symptoms 11. Glaucoma: Continue Ocuvite bid and  Xalatan at bedtime.  12.  Hypoalb- prostat 13.  Insomnia- improved    LOS (Days) 21 A FACE TO FACE EVALUATION WAS PERFORMED  Charlett Blake 03/20/2018, 8:43 AM

## 2018-03-21 ENCOUNTER — Inpatient Hospital Stay (HOSPITAL_COMMUNITY): Payer: PPO | Admitting: Physical Therapy

## 2018-03-21 ENCOUNTER — Inpatient Hospital Stay (HOSPITAL_COMMUNITY): Payer: PPO | Admitting: Speech Pathology

## 2018-03-21 ENCOUNTER — Inpatient Hospital Stay (HOSPITAL_COMMUNITY): Payer: PPO | Admitting: Occupational Therapy

## 2018-03-21 LAB — GLUCOSE, CAPILLARY
GLUCOSE-CAPILLARY: 164 mg/dL — AB (ref 65–99)
GLUCOSE-CAPILLARY: 133 mg/dL — AB (ref 65–99)
Glucose-Capillary: 145 mg/dL — ABNORMAL HIGH (ref 65–99)
Glucose-Capillary: 139 mg/dL — ABNORMAL HIGH (ref 65–99)

## 2018-03-21 NOTE — Progress Notes (Signed)
Occupational Therapy Session Note  Patient Details  Name: Kyle Richardson MRN: [id number] Date of Birth: Apr 04, 1928  Today's Date: 03/21/2018 OT Individual Time: 1103-1200 OT Individual Time Calculation (min): 57 min   Short Term Goals: Week 4:  OT Short Term Goal 1 (Week 4): STG=LTG 2/2 ELOS  Skilled Therapeutic Interventions/Progress Updates:    Pt greeted sitting in wc and agreeable to OT treatment session. UB dressing completed wc at the sink with improved attention to R side to help thread R UE, although he still needed guided A to get it in the sleeve. Pt brought to therapy gym and worked on functional use of RUE and R visual scanning. Placed orange X at midline on table, and had pt grasp cup with R UE and place on X. Verbal cues to look at R UE when performing tasks, guided A for accuracy and hand placement. Graded activity to crossing midline with R UE then placing it on X to the R. B UE coordination activity of towel folding task with difficulty grasping cloth requiring guided A to bring UE to corner, but improved coordinated movements with repetition. Pt returned to room at end of session and left seated in wc with safety belt on and needs met.   Therapy Documentation Precautions:  Precautions Precautions: Fall Precaution Comments: R hemiplegia, increased R hamstring tone Restrictions Weight Bearing Restrictions: No Pain: Pain Assessment Pain Scale: 0-10 Pain Score: 0-No pain ADL: ADL ADL Comments: Please see functional navigator   See Function Navigator for Current Functional Status.   Therapy/Group: Individual Therapy  Valma Cava 03/21/2018, 11:30 AM

## 2018-03-21 NOTE — Progress Notes (Signed)
Patient remains on sleep chart; patient sleep throughout the shift. Will continue to monitor

## 2018-03-21 NOTE — Progress Notes (Signed)
Subjective/Complaints: Sleeping well per chart  ROS: Limited due to cognitive/behavioral    Objective: Vital Signs: Blood pressure (!) 141/73, pulse (!) 51, temperature 98 F (36.7 C), temperature source Oral, resp. rate 16, height 5\' 8"  (1.727 m), weight 73.2 kg (161 lb 6 oz), SpO2 98 %. No results found. Results for orders placed or performed during the hospital encounter of 02/27/18 (from the past 72 hour(s))  Glucose, capillary     Status: Abnormal   Collection Time: 03/18/18 11:55 AM  Result Value Ref Range   Glucose-Capillary 169 (H) 65 - 99 mg/dL  Glucose, capillary     Status: Abnormal   Collection Time: 03/18/18  5:26 PM  Result Value Ref Range   Glucose-Capillary 129 (H) 65 - 99 mg/dL  Glucose, capillary     Status: Abnormal   Collection Time: 03/18/18 10:26 PM  Result Value Ref Range   Glucose-Capillary 165 (H) 65 - 99 mg/dL   Comment 1 Notify RN   Glucose, capillary     Status: Abnormal   Collection Time: 03/19/18  7:04 AM  Result Value Ref Range   Glucose-Capillary 117 (H) 65 - 99 mg/dL   Comment 1 Notify RN   Glucose, capillary     Status: Abnormal   Collection Time: 03/19/18 11:37 AM  Result Value Ref Range   Glucose-Capillary 166 (H) 65 - 99 mg/dL  Glucose, capillary     Status: Abnormal   Collection Time: 03/19/18  5:10 PM  Result Value Ref Range   Glucose-Capillary 124 (H) 65 - 99 mg/dL  Glucose, capillary     Status: Abnormal   Collection Time: 03/19/18  8:57 PM  Result Value Ref Range   Glucose-Capillary 203 (H) 65 - 99 mg/dL  Glucose, capillary     Status: Abnormal   Collection Time: 03/20/18  6:17 AM  Result Value Ref Range   Glucose-Capillary 111 (H) 65 - 99 mg/dL  Glucose, capillary     Status: Abnormal   Collection Time: 03/20/18 12:30 PM  Result Value Ref Range   Glucose-Capillary 158 (H) 65 - 99 mg/dL  Glucose, capillary     Status: Abnormal   Collection Time: 03/20/18  5:32 PM  Result Value Ref Range   Glucose-Capillary 124 (H) 65 -  99 mg/dL  Glucose, capillary     Status: Abnormal   Collection Time: 03/20/18 10:21 PM  Result Value Ref Range   Glucose-Capillary 149 (H) 65 - 99 mg/dL  Glucose, capillary     Status: Abnormal   Collection Time: 03/21/18  6:21 AM  Result Value Ref Range   Glucose-Capillary 145 (H) 65 - 99 mg/dL     Constitutional: No distress . Vital signs reviewed. HEENT: EOMI, oral membranes moist Neck: supple Cardiovascular: RRR without murmur. No JVD    Respiratory: CTA Bilaterally without wheezes or rales. Normal effort    GI: BS +, non-tender, non-distended  Musc: No edema or tenderness in extremities. Neuro: Alert.  Word finding deficits Motor: Limited due to participation and apraxia ?RUE: 0/5 proximal distal ?RLE: Hip flexion, knee extension, ADF 4/5 ?LLE: 4+/5 proximal to distal  .   Assessment/Plan: 1. Functional deficits secondary to left PCA infarct with right hemiparesis, receptive aphasia which require 3+ hours per day of interdisciplinary therapy in a comprehensive inpatient rehab setting. Physiatrist is providing close team supervision and 24 hour management of active medical problems listed below. Physiatrist and rehab team continue to assess barriers to discharge/monitor patient progress toward functional and medical goals. FIM:  Function - Bathing Position: Shower Body parts bathed by patient: Chest, Abdomen, Front perineal area, Right arm, Left arm, Right upper leg, Left upper leg, Right lower leg, Left lower leg, Buttocks Body parts bathed by helper: Back Assist Level: Touching or steadying assistance(Pt > 75%)  Function- Upper Body Dressing/Undressing What is the patient wearing?: Pull over shirt/dress Pull over shirt/dress - Perfomed by patient: Thread/unthread left sleeve, Put head through opening, Pull shirt over trunk Pull over shirt/dress - Perfomed by helper: Thread/unthread right sleeve Assist Level: Touching or steadying assistance(Pt > 75%) Function - Lower  Body Dressing/Undressing What is the patient wearing?: Pants, Socks, Shoes Position: Wheelchair/chair at sink Underwear - Performed by helper: Thread/unthread right underwear leg, Thread/unthread left underwear leg, Pull underwear up/down Pants- Performed by patient: Thread/unthread right pants leg, Thread/unthread left pants leg, Pull pants up/down Pants- Performed by helper: Thread/unthread right pants leg Non-skid slipper socks- Performed by helper: Don/doff right sock, Don/doff left sock Socks - Performed by patient: Don/doff left sock Socks - Performed by helper: Don/doff right sock Shoes - Performed by patient: Don/doff left shoe, Don/doff right shoe Shoes - Performed by helper: Fasten right, Fasten left Assist for footwear: Supervision/touching assist Assist for lower body dressing: Touching or steadying assistance (Pt > 75%)  Function - Toileting Toileting activity did not occur: No continent bowel/bladder event Toileting steps completed by helper: Adjust clothing prior to toileting, Performs perineal hygiene, Adjust clothing after toileting Assist level: Touching or steadying assistance (Pt.75%)  Function - Toilet Transfers Toilet transfer activity did not occur: Safety/medical concerns Toilet transfer assistive device: Mechanical lift Mechanical lift: Stedy Assist level to toilet: 2 helpers Assist level from toilet: Maximal assist (Pt 25 - 49%/lift and lower)  Function - Chair/bed transfer Chair/bed transfer method: Stand pivot Chair/bed transfer assist level: Moderate assist (Pt 50 - 74%/lift or lower) Chair/bed transfer assistive device: Armrests Mechanical lift: Stedy Chair/bed transfer details: Manual facilitation for placement, Manual facilitation for weight shifting, Verbal cues for sequencing, Verbal cues for technique  Function - Locomotion: Wheelchair Will patient use wheelchair at discharge?: Yes Type: Manual Wheelchair activity did not occur: Safety/medical  concerns Max wheelchair distance: 70 Assist Level: Touching or steadying assistance (Pt > 75%) Wheel 50 feet with 2 turns activity did not occur: Safety/medical concerns Assist Level: Touching or steadying assistance (Pt > 75%) Wheel 150 feet activity did not occur: Safety/medical concerns Assist Level: Touching or steadying assistance (Pt > 75%) Turns around,maneuvers to table,bed, and toilet,negotiates 3% grade,maneuvers on rugs and over doorsills: No Function - Locomotion: Ambulation Ambulation activity did not occur: Safety/medical concerns Assistive device: Walker-rolling, Orthosis Max distance: 42 ft Assist level: Maximal assist (Pt 25 - 49%) Walk 10 feet activity did not occur: Safety/medical concerns Assist level: Maximal assist (Pt 25 - 49%) Walk 50 feet with 2 turns activity did not occur: Safety/medical concerns Assist level: Moderate assist (Pt 50 - 74%) Walk 150 feet activity did not occur: Safety/medical concerns Walk 10 feet on uneven surfaces activity did not occur: Safety/medical concerns  Function - Comprehension Comprehension: Auditory Comprehension assistive device: Hearing aids Comprehension assist level: Understands basic 75 - 89% of the time/ requires cueing 10 - 24% of the time  Function - Expression Expression: Verbal Expression assist level: Expresses basic 50 - 74% of the time/requires cueing 25 - 49% of the time. Needs to repeat parts of sentences.  Function - Social Interaction Social Interaction assist level: Interacts appropriately 50 - 74% of the time - May be physically or verbally  inappropriate.  Function - Problem Solving Problem solving assist level: Solves basic 25 - 49% of the time - needs direction more than half the time to initiate, plan or complete simple activities  Function - Memory Memory assist level: Recognizes or recalls 25 - 49% of the time/requires cueing 50 - 75% of the time Patient normally able to recall (first 3 days only):  None of the above   Medical Problem List and Plan: 1.  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  Cont CIR-PT, OT, SLP-   2.  DVT Prophylaxis/Anticoagulation:  Eliquis  will serve as DVT prophylaxis as well 3. Pain Management: tylenol prn.  4. Mood: Team to provide ego support to help with frustration.  LCSW to follow for evaluation and support when appropriate.  On Prozac-neuropsych f/u 5. Neuropsych: This patient is not capable of making decisions on his own behalf. 6. Skin/Wound Care: routine pressure relief measures.  7. Fluids/Electrolytes/Nutrition: Monitor I/Os. Added full supervision at meals for safety., BMET within acceptable range on 5/27 and 6/3, I 883ml recorded with meals yesterday 8. PAF/CAD/HTN: Monitor HR bid. ON metoprolol and Lipitor.  Vitals:   03/20/18 2037 03/21/18 0329  BP: 127/63 (!) 141/73  Pulse: (!) 54 (!) 51  Resp: 18 16  Temp: 98 F (36.7 C) 98 F (36.7 C)  SpO2: 97% 98%   low dose amlodipine started 5/28 Good  control 6/13, asymptomatic brady due to BB 9. T2DM: Hgb A1C- 7.1.  Monitor BS ac/hs and use SSI for elevated BS. Metformin d/ced due to GI Side effects CBG (last 3)  Recent Labs    03/20/18 1732 03/20/18 2221 03/21/18 0621  GLUCAP 124* 149* 145*   Good  control 6/13 continue SSI 10. Myasthenia Gravis with intermittent diplopia: On mestonin 30 mg bid--to be increased to 60 mg bid per discharge summary, daughter doesn't want him to have this, had EMG confirmation. Monitor for fatiguability, ocular symptoms--- but appears to be tolerating therapy thus far,   -Consider revisiting treatments with daughter if further symptoms arise as pt cannot make a decision, pt also cannot provide subjective symptoms 11. Glaucoma: Continue Ocuvite bid and  Xalatan at bedtime.  12.  Hypoalb- prostat 13.  Insomnia- improved , Per sleep graph 6 hours uninterrupted then 2 more hours-no trazodone last noc   LOS (Days) 22 A FACE  TO FACE EVALUATION WAS PERFORMED  Charlett Blake 03/21/2018, 8:38 AM

## 2018-03-21 NOTE — Progress Notes (Signed)
Speech Language Pathology Weekly Progress and Session Note  Patient Details  Name: Kyle Richardson MRN: [id number] Date of Birth: 04-16-1928  Beginning of progress report period: March 14, 2018 End of progress report period: March 21, 2018  Today's Date: 03/21/2018 SLP Individual Time: 0930-1030 SLP Individual Time Calculation (min): 60 min  Short Term Goals: Week 3: SLP Short Term Goal 1 (Week 3): Patient will attend to right field of enviornment during functional tasks with Min A multimodal cues.  SLP Short Term Goal 1 - Progress (Week 3): Met SLP Short Term Goal 2 (Week 3): Patient will demonstrate selective attention to tasks for ~15 minutes with Min A verbal cues for redirection in a mildly distracting enviornment.  SLP Short Term Goal 2 - Progress (Week 3): Met SLP Short Term Goal 3 (Week 3): Patient will demonstrate functional problem solving for basic and familiar tasks with Mod A verbal cues.  SLP Short Term Goal 3 - Progress (Week 3): Met SLP Short Term Goal 4 (Week 3): Patient will self-monitor and correct verbal errors at the phrase level during strutured tasks with Min A verbal cues.  SLP Short Term Goal 4 - Progress (Week 3): Not met SLP Short Term Goal 5 (Week 3): Patient will decode at the word level with Max A multimodal cues.  SLP Short Term Goal 5 - Progress (Week 3): Not met SLP Short Term Goal 6 (Week 3): Patient will follow 2-step commands with Mod A verbal cues.  SLP Short Term Goal 6 - Progress (Week 3): Not met    New Short Term Goals: Week 4: SLP Short Term Goal 1 (Week 4): Patient will attend to right field of enviornment during functional tasks with supervision A multimodal cues.  SLP Short Term Goal 2 (Week 4): Patient will demonstrate selective attention to tasks for ~30 minutes with Min A verbal cues for redirection in a mildly distracting enviornment.  SLP Short Term Goal 3 (Week 4): Patient will demonstrate functional problem solving for basic and familiar  tasks with Min A verbal cues.  SLP Short Term Goal 4 (Week 4): Patient will self-monitor and correct verbal errors at the phrase level during strutured tasks with Min A verbal cues.  SLP Short Term Goal 5 (Week 4): Patient will follow 2-step commands with Mod A verbal cues.   Weekly Progress Updates: Patient continues to make slow and consistent gains and has met 3 of 6 STG's this reporting period. Currently, patient requires overall Min A for selective attention and attention to right field of environment and requires overall Mod A verbal cues for functional problem solving. Patient continues to make gains in regards to verbal expression and can express wants/needs at the phrase level. However, patient demonstrates breakdown with mildly complex auditory comprehension tasks with continued verbal perseveration and decreased ability to self-monitor and correct verbal errors. Patient's function is also impacted by severe visual impairments. Patient and family education is ongoing. Patient would benefit from continued skilled SLP intervention to maximize his cognitive-linguistic function prior to discharge.      Intensity: Minumum of 1-2 x/day, 30 to 90 minutes Frequency: 3 to 5 out of 7 days Duration/Length of Stay: 03/28/18 Treatment/Interventions: Cognitive remediation/compensation;Environmental controls;Internal/external aids;Speech/Language facilitation;Therapeutic Activities;Patient/family education;Functional tasks;Cueing hierarchy   Daily Session  Skilled Therapeutic Interventions: Skilled treatment session focused on communication goals. SLP facilitated session by providing Mod A verbal and visual cues for patient to name pictures of familiar objects and Mod-Max A verbal cues to compare/contrast at the phrase level.  Patient demonstrated difficulty with task due to verbal perseveration, decreased comprehension of goal of task and visual impairments and also required Max A verbal cues to  self-monitor and correct errors. Patient could identify numbers today but required total A to identify letters but overall demonstrated increased to visual objects in pictures. Patient's performance is inconsistent, suspect mostly impacted by language impairments in regards to auditory comprehension and visual impairments. Patient left upright in wheelchair with all needs within reach. Continue with current plan of care.       Function:    Cognition Comprehension Comprehension assist level: Understands basic 50 - 74% of the time/ requires cueing 25 - 49% of the time  Expression   Expression assist level: Expresses basic 50 - 74% of the time/requires cueing 25 - 49% of the time. Needs to repeat parts of sentences.  Social Interaction Social Interaction assist level: Interacts appropriately 50 - 74% of the time - May be physically or verbally inappropriate.  Problem Solving Problem solving assist level: Solves basic 25 - 49% of the time - needs direction more than half the time to initiate, plan or complete simple activities  Memory Memory assist level: Recognizes or recalls 25 - 49% of the time/requires cueing 50 - 75% of the time   Pain No/Denies Pain   Therapy/Group: Individual Therapy  Cami Delawder 03/21/2018, 3:49 PM

## 2018-03-21 NOTE — Plan of Care (Signed)
  Problem: Consults Goal: RH STROKE PATIENT EDUCATION Description See Patient Education module for education specifics  Outcome: Progressing   Problem: RH BOWEL ELIMINATION Goal: RH STG MANAGE BOWEL WITH ASSISTANCE Description STG Manage Bowel with mod Assistance.  Outcome: Progressing Goal: RH STG MANAGE BOWEL W/MEDICATION W/ASSISTANCE Description STG Manage Bowel with Medication with mod Assistance.  Outcome: Progressing   Problem: RH BLADDER ELIMINATION Goal: RH STG MANAGE BLADDER WITH ASSISTANCE Description STG Manage Bladder With mod Assistance  Outcome: Progressing   Problem: RH SKIN INTEGRITY Goal: RH STG SKIN FREE OF INFECTION/BREAKDOWN Description Pt will remain free from skin infection/ breakdown with mod assistance.  Outcome: Progressing Goal: RH STG MAINTAIN SKIN INTEGRITY WITH ASSISTANCE Description STG Maintain Skin Integrity With mod Assistance.  Outcome: Progressing   Problem: RH SAFETY Goal: RH STG ADHERE TO SAFETY PRECAUTIONS W/ASSISTANCE/DEVICE Description STG Adhere to Safety Precautions With mod  Assistance/Device.  Outcome: Progressing   Problem: RH COGNITION-NURSING Goal: RH STG USES MEMORY AIDS/STRATEGIES W/ASSIST TO PROBLEM SOLVE Description STG Uses Memory Aids/Strategies With mod Assistance to Problem Solve.  Outcome: Progressing Goal: RH STG ANTICIPATES NEEDS/CALLS FOR ASSIST W/ASSIST/CUES Description STG Anticipates Needs/Calls for Assist With mod Assistance/Cues.  Outcome: Progressing   Problem: RH KNOWLEDGE DEFICIT Goal: RH STG INCREASE KNOWLEDGE OF DIABETES Description Mod assist  Outcome: Progressing Goal: RH STG INCREASE KNOWLEDGE OF HYPERTENSION Description Mod assist  Outcome: Progressing   

## 2018-03-21 NOTE — Progress Notes (Signed)
Physical Therapy Weekly Progress Note  Patient Details  Name: Kyle Richardson MRN: [id number] Date of Birth: 06/18/1928  Beginning of progress report period: March 14, 2018 End of progress report period: March 21, 2018  Today's Date: 03/21/2018 PT Individual Time: 2549-8264 AND 1300-1340 PT Individual Time Calculation (min): 27 min AND 40 min  Patient has met 3 of 3 short term goals. Pt is continuing to progress with therapies, performing most mobility at the mod assist level including transfers and gait w/ RW household distances. He continues to require min verbal, visual, and tactile cues to increased R attention and R body awareness as it still affects independence w/ functional mobility, although improved. Pt's family has been supportive and present for many therapy sessions and express understanding of realistic d/c and long-term goals. Pt's daughter has arranged to hire caregivers to provide 24/7 assist for pt in the home and both caregivers plan to be present for education prior to d/c.   Patient continues to demonstrate the following deficits muscle weakness, decreased cardiorespiratoy endurance, impaired timing and sequencing, abnormal tone, unbalanced muscle activation, motor apraxia and decreased motor planning, decreased visual acuity, decreased visual perceptual skills and field cut, decreased midline orientation and decreased attention to right, decreased attention, decreased awareness, decreased problem solving, decreased safety awareness, decreased memory and delayed processing and decreased sitting balance, decreased standing balance, decreased postural control, hemiplegia and decreased balance strategies and therefore will continue to benefit from skilled PT intervention to increase functional independence with mobility.  Patient progressing toward long term goals..  Continue plan of care.  PT Short Term Goals Week 3:  PT Short Term Goal 1 (Week 3): Pt will perform bed<>chair transfer  w/ mod assist consistently PT Short Term Goal 1 - Progress (Week 3): Met PT Short Term Goal 2 (Week 3): Pt will self-propel manual w/c 25' w/ supervision PT Short Term Goal 2 - Progress (Week 3): Met PT Short Term Goal 3 (Week 3): Pt will initiate stair training PT Short Term Goal 3 - Progress (Week 3): Met Week 4:  PT Short Term Goal 1 (Week 4): =LTGs due to ELOS  Skilled Therapeutic Interventions/Progress Updates:   Session 1:  Pt in supine and agreeable to therapy, denies pain. Transferred to EOB w/ min assist and donned pants and shoes w/ total assist for time management. Performed sit<>stand w/ min assist while therapist pulled pants over hips and transferred to w/c via stand pivot w/ mod assist. Remainder of session focused on functional standing balance w/o UE support at high/low table. Pt performed clothespin activity in standing requiring him to scan environment, follow simple 2-step commands, and attend to task. Min guard overall for static standing balance in 1-2 min bouts. Occasional verbal and tactile cues to engage RLE to maintain standing. Ended session in w/c and in care of speech therapist, all needs met.   Session 2:  Pt in TIS and agreeable to therapy, denies pain. Session focused on blocked practice of stand pivot transfers w/ RW in functional environment, going w/c<>ADL apartment bed and w/c<>couch. Transfers ranged from min guard to mod assist for RW management and skilled cues required for step placement and sequencing of transfer. Pt able to verbalize his movements by repeating therapist's cues out loud "turn", "step over", "step back", etc to increased success and safety of transfer. Returned to room and ended session tilted in TIS, call bell within reach and all needs met.   Therapy Documentation Precautions:  Precautions Precautions: Fall Precaution Comments: R  hemiplegia, increased R hamstring tone Restrictions Weight Bearing Restrictions: No  See Function  Navigator for Current Functional Status.  Therapy/Group: Individual Therapy  Kyle Richardson 03/21/2018, 9:31 AM

## 2018-03-22 ENCOUNTER — Inpatient Hospital Stay (HOSPITAL_COMMUNITY): Payer: PPO | Admitting: Speech Pathology

## 2018-03-22 ENCOUNTER — Inpatient Hospital Stay (HOSPITAL_COMMUNITY): Payer: PPO | Admitting: Occupational Therapy

## 2018-03-22 ENCOUNTER — Inpatient Hospital Stay (HOSPITAL_COMMUNITY): Payer: PPO | Admitting: Physical Therapy

## 2018-03-22 LAB — GLUCOSE, CAPILLARY
GLUCOSE-CAPILLARY: 131 mg/dL — AB (ref 65–99)
GLUCOSE-CAPILLARY: 143 mg/dL — AB (ref 65–99)
GLUCOSE-CAPILLARY: 207 mg/dL — AB (ref 65–99)
GLUCOSE-CAPILLARY: 143 mg/dL — AB (ref 65–99)
Glucose-Capillary: 120 mg/dL — ABNORMAL HIGH (ref 65–99)

## 2018-03-22 NOTE — Progress Notes (Signed)
Subjective/Complaints:  Remains aphasic but can follow commands  ROS: Limited due to cognitive/behavioral    Objective: Vital Signs: Blood pressure 138/66, pulse (!) 51, temperature 98.2 F (36.8 C), temperature source Oral, resp. rate 12, height 5\' 8"  (1.727 m), weight 73.2 kg (161 lb 6 oz), SpO2 95 %. No results found. Results for orders placed or performed during the hospital encounter of 02/27/18 (from the past 72 hour(s))  Glucose, capillary     Status: Abnormal   Collection Time: 03/19/18 11:37 AM  Result Value Ref Range   Glucose-Capillary 166 (H) 65 - 99 mg/dL  Glucose, capillary     Status: Abnormal   Collection Time: 03/19/18  5:10 PM  Result Value Ref Range   Glucose-Capillary 124 (H) 65 - 99 mg/dL  Glucose, capillary     Status: Abnormal   Collection Time: 03/19/18  8:57 PM  Result Value Ref Range   Glucose-Capillary 203 (H) 65 - 99 mg/dL  Glucose, capillary     Status: Abnormal   Collection Time: 03/20/18  6:17 AM  Result Value Ref Range   Glucose-Capillary 111 (H) 65 - 99 mg/dL  Glucose, capillary     Status: Abnormal   Collection Time: 03/20/18 12:30 PM  Result Value Ref Range   Glucose-Capillary 158 (H) 65 - 99 mg/dL  Glucose, capillary     Status: Abnormal   Collection Time: 03/20/18  5:32 PM  Result Value Ref Range   Glucose-Capillary 124 (H) 65 - 99 mg/dL  Glucose, capillary     Status: Abnormal   Collection Time: 03/20/18 10:21 PM  Result Value Ref Range   Glucose-Capillary 149 (H) 65 - 99 mg/dL  Glucose, capillary     Status: Abnormal   Collection Time: 03/21/18  6:21 AM  Result Value Ref Range   Glucose-Capillary 145 (H) 65 - 99 mg/dL  Glucose, capillary     Status: Abnormal   Collection Time: 03/21/18 12:00 PM  Result Value Ref Range   Glucose-Capillary 139 (H) 65 - 99 mg/dL  Glucose, capillary     Status: Abnormal   Collection Time: 03/21/18  4:59 PM  Result Value Ref Range   Glucose-Capillary 164 (H) 65 - 99 mg/dL  Glucose, capillary      Status: Abnormal   Collection Time: 03/21/18  9:51 PM  Result Value Ref Range   Glucose-Capillary 133 (H) 65 - 99 mg/dL  Glucose, capillary     Status: Abnormal   Collection Time: 03/22/18  7:09 AM  Result Value Ref Range   Glucose-Capillary 120 (H) 65 - 99 mg/dL     Constitutional: No distress . Vital signs reviewed. HEENT: EOMI, oral membranes moist Neck: supple Cardiovascular: RRR without murmur. No JVD    Respiratory: CTA Bilaterally without wheezes or rales. Normal effort    GI: BS +, non-tender, non-distended  Musc: No edema or tenderness in extremities. Neuro: Alert.  Word finding deficits Motor: Limited due to participation and apraxia ?RUE: 0/5 proximal distal ?RLE: Hip flexion, knee extension, ADF 4/5 ?LLE: 4+/5 proximal to distal  .   Assessment/Plan: 1. Functional deficits secondary to left PCA infarct with right hemiparesis, receptive aphasia which require 3+ hours per day of interdisciplinary therapy in a comprehensive inpatient rehab setting. Physiatrist is providing close team supervision and 24 hour management of active medical problems listed below. Physiatrist and rehab team continue to assess barriers to discharge/monitor patient progress toward functional and medical goals. FIM: Function - Bathing Position: Shower Body parts bathed by patient: Chest,  Abdomen, Front perineal area, Right arm, Left arm, Right upper leg, Left upper leg, Right lower leg, Left lower leg, Buttocks Body parts bathed by helper: Back Assist Level: Touching or steadying assistance(Pt > 75%)  Function- Upper Body Dressing/Undressing What is the patient wearing?: Pull over shirt/dress Pull over shirt/dress - Perfomed by patient: Thread/unthread left sleeve, Put head through opening, Pull shirt over trunk Pull over shirt/dress - Perfomed by helper: Thread/unthread right sleeve Assist Level: Touching or steadying assistance(Pt > 75%) Function - Lower Body Dressing/Undressing What is  the patient wearing?: Pants, Socks, Shoes Position: Wheelchair/chair at sink Underwear - Performed by helper: Thread/unthread right underwear leg, Thread/unthread left underwear leg, Pull underwear up/down Pants- Performed by patient: Thread/unthread right pants leg, Thread/unthread left pants leg, Pull pants up/down Pants- Performed by helper: Thread/unthread right pants leg Non-skid slipper socks- Performed by helper: Don/doff right sock, Don/doff left sock Socks - Performed by patient: Don/doff left sock Socks - Performed by helper: Don/doff right sock Shoes - Performed by patient: Don/doff left shoe, Don/doff right shoe Shoes - Performed by helper: Fasten right, Fasten left Assist for footwear: Supervision/touching assist Assist for lower body dressing: Touching or steadying assistance (Pt > 75%)  Function - Toileting Toileting activity did not occur: No continent bowel/bladder event Toileting steps completed by helper: Adjust clothing prior to toileting, Performs perineal hygiene, Adjust clothing after toileting Assist level: Touching or steadying assistance (Pt.75%)  Function - Toilet Transfers Toilet transfer activity did not occur: Safety/medical concerns Toilet transfer assistive device: Mechanical lift Mechanical lift: Stedy Assist level to toilet: 2 helpers Assist level from toilet: 2 helpers  Function - Chair/bed transfer Chair/bed transfer [place]: Stand pivot Chair/bed transfer assist level: Moderate assist (Pt 50 - 74%/lift or lower) Chair/bed transfer assistive device: Armrests, Walker Mechanical lift: Stedy Chair/bed transfer details: Manual facilitation for placement, Manual facilitation for weight shifting, Verbal cues for sequencing, Verbal cues for technique  Function - Locomotion: Wheelchair Will patient use wheelchair at discharge?: Yes Type: Manual Wheelchair activity did not occur: Safety/medical concerns Max wheelchair distance: 70 Assist Level: Touching  or steadying assistance (Pt > 75%) Wheel 50 feet with 2 turns activity did not occur: Safety/medical concerns Assist Level: Touching or steadying assistance (Pt > 75%) Wheel 150 feet activity did not occur: Safety/medical concerns Assist Level: Touching or steadying assistance (Pt > 75%) Turns around,maneuvers to table,bed, and toilet,negotiates 3% grade,maneuvers on rugs and over doorsills: No Function - Locomotion: Ambulation Ambulation activity did not occur: Safety/medical concerns Assistive device: Walker-rolling, Orthosis Max distance: 42 ft Assist level: Maximal assist (Pt 25 - 49%) Walk 10 feet activity did not occur: Safety/medical concerns Assist level: Maximal assist (Pt 25 - 49%) Walk 50 feet with 2 turns activity did not occur: Safety/medical concerns Assist level: Moderate assist (Pt 50 - 74%) Walk 150 feet activity did not occur: Safety/medical concerns Walk 10 feet on uneven surfaces activity did not occur: Safety/medical concerns  Function - Comprehension Comprehension: Auditory Comprehension assistive device: Hearing aids Comprehension assist level: Understands basic 75 - 89% of the time/ requires cueing 10 - 24% of the time  Function - Expression Expression: Verbal Expression assist level: Expresses basic 50 - 74% of the time/requires cueing 25 - 49% of the time. Needs to repeat parts of sentences.  Function - Social Interaction Social Interaction assist level: Interacts appropriately 75 - 89% of the time - Needs redirection for appropriate language or to initiate interaction.  Function - Problem Solving Problem solving assist level: Solves basic 25 -  49% of the time - needs direction more than half the time to initiate, plan or complete simple activities  Function - Memory Memory assist level: Recognizes or recalls 25 - 49% of the time/requires cueing 50 - 75% of the time Patient normally able to recall (first 3 days only): None of the above   Medical  Problem List and Plan: 1.  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  Cont CIR-PT, OT, SLP- making good progres  2.  DVT Prophylaxis/Anticoagulation:  Eliquis  will serve as DVT prophylaxis as well 3. Pain Management: tylenol prn.  4. Mood: Team to provide ego support to help with frustration.  LCSW to follow for evaluation and support when appropriate.  On Prozac-neuropsych f/u 5. Neuropsych: This patient is not capable of making decisions on his own behalf. 6. Skin/Wound Care: routine pressure relief measures.  7. Fluids/Electrolytes/Nutrition: Monitor I/Os. Added full supervision at meals for safety., BMET within acceptable range on 5/27 and 6/3, I 877ml recorded with meals yesterday 8. PAF/CAD/HTN: Monitor HR bid. ON metoprolol and Lipitor.  Vitals:   03/21/18 2148 03/22/18 0422  BP: (!) 150/57 138/66  Pulse: (!) 51 (!) 51  Resp: 15 12  Temp: 97.8 F (36.6 C) 98.2 F (36.8 C)  SpO2: 92% 95%   low dose amlodipine started 5/28 Good  control 6/14, asymptomatic brady due to BB 9. T2DM: Hgb A1C- 7.1.  Monitor BS ac/hs and use SSI for elevated BS. Metformin d/ced due to GI Side effects CBG (last 3)  Recent Labs    03/21/18 1659 03/21/18 2151 03/22/18 0709  GLUCAP 164* 133* 120*   Good  control 6/14 continue SSI 10. Myasthenia Gravis with intermittent diplopia: On mestonin 30 mg bid--to be increased to 60 mg bid per discharge summary, daughter doesn't want him to have this, had EMG confirmation. Monitor for fatiguability, ocular symptoms--- but appears to be tolerating therapy thus far,   -Consider revisiting treatments with daughter if further symptoms arise as pt cannot make a decision, pt also cannot provide subjective symptoms 11. Glaucoma: Continue Ocuvite bid and  Xalatan at bedtime.  12.  Hypoalb- prostat 13.  Insomnia- improved , Per sleep graph 7 hours LOS (Days) 23 A FACE TO FACE EVALUATION WAS PERFORMED  Charlett Blake 03/22/2018,  8:18 AM

## 2018-03-22 NOTE — Progress Notes (Signed)
Speech Language Pathology Daily Session Notes  Patient Details  Name: Kyle Richardson MRN: [id number] Date of Birth: 25-Oct-1927  Today's Date: 03/22/2018  Session 1: SLP Individual Time: 2706-2376 SLP Individual Time Calculation (min): 15 min   Session 2: SLP Individual Time: 1200-1230 SLP Individual Time Calculation (min): 30 min    Short Term Goals: Week 4: SLP Short Term Goal 1 (Week 4): Patient will attend to right field of enviornment during functional tasks with supervision A multimodal cues.  SLP Short Term Goal 2 (Week 4): Patient will demonstrate selective attention to tasks for ~30 minutes with Min A verbal cues for redirection in a mildly distracting enviornment.  SLP Short Term Goal 3 (Week 4): Patient will demonstrate functional problem solving for basic and familiar tasks with Min A verbal cues.  SLP Short Term Goal 4 (Week 4): Patient will self-monitor and correct verbal errors at the phrase level during strutured tasks with Min A verbal cues.  SLP Short Term Goal 5 (Week 4): Patient will follow 2-step commands with Mod A verbal cues.   Skilled Therapeutic Interventions:  Session 1: Skilled treatment session focused on communication goals. Upon arrival, patient was awake while upright in bed. Patient independently verbalized his "vision is cut in half" when asked how he was doing. SLP educated patient in regards to deficits since stroke, however, despite Max A multimodal cues patient perseverative on his vision and could not participate in any task or conversation appropriately due to perseveration. Therefore, session was ended early with plans for SLP to return later in the morning. Patient left upright in bed with alarm on. Continue with current plan of care.   Session 2: Skilled treatment session focused on cognitive goals. SLP facilitated session by providing Mod A verbal cues for problem solving in regards to tray set-up and self-feeding and Mod-Max A verbal and visual cues  for scanning to right field of environment to locate items on tray. Patient participated in a basic conversation throughout meal and was Mod I for word-finding. Patient left with RN present. Continue with current plan of care.   Function:  Eating Eating   Modified Consistency Diet: No Eating Assist Level: Set up assist for;More than reasonable amount of time   Eating Set Up Assist For: Opening containers;Cutting food       Cognition Comprehension Comprehension assist level: Understands basic 75 - 89% of the time/ requires cueing 10 - 24% of the time  Expression   Expression assist level: Expresses basic 50 - 74% of the time/requires cueing 25 - 49% of the time. Needs to repeat parts of sentences.  Social Interaction Social Interaction assist level: Interacts appropriately 75 - 89% of the time - Needs redirection for appropriate language or to initiate interaction.  Problem Solving Problem solving assist level: Solves basic 50 - 74% of the time/requires cueing 25 - 49% of the time  Memory Memory assist level: Recognizes or recalls 25 - 49% of the time/requires cueing 50 - 75% of the time    Pain No/Denies pain   Therapy/Group: Individual Therapy  Inell Mimbs 03/22/2018, 4:14 PM

## 2018-03-22 NOTE — Progress Notes (Signed)
Patient remains on sleeping record at bedtime; patient slept 8 hrs this shift. No distress observed. Will continue to monitor

## 2018-03-22 NOTE — Progress Notes (Signed)
Physical Therapy Session Note  Patient Details  Name: Kyle Richardson MRN: [id number] Date of Birth: 1928/02/04  Today's Date: 03/22/2018 PT Individual Time: 1430-1550 PT Individual Time Calculation (min): 80 min   Short Term Goals: Week 4:  PT Short Term Goal 1 (Week 4): =LTGs due to ELOS  Skilled Therapeutic Interventions/Progress Updates:   Pt in TIS and agreeable to therapy, denies pain but reports feeling "off" today. He states he had an episode of blurry vision earlier today, he felt "like he was going to pass out" and "everything went black". Made RN aware. Worked on postural control in standing w/o UE support, min guard to min assist to correct LOB, while performing LUE reaching tasks anteriorly. Emphasis on anterior weight shifting to correct for posterior lean bias. Able to maintain static stance in 1-2 min bouts before fatiguing and losing balance. Frequent tactile and verbal cues for upright posture and neutral trunk and shoulder rotation. Additionally performed multiple sit<>stands from mat throughout session w/o AD support to boost w/ min guard. Verbal and tactile cues for technique and RLE awareness during transfer. Worked on gait 2nd half of session. Ambulated 30' w/o AD, mod assist overall for upright balance and lateral weight shifting, focus on exaggerating weight shifts to clear RLE more during swing. Ambulated 50-60' x4 reps w/ RW and RUE orthosis w/ min tactile and manual cues to perform lateral weight shifting, min-mod assist overall. Returned to room and ended session in TIS, call bell within reach and all needs met.   Therapy Documentation Precautions:  Precautions Precautions: Fall Precaution Comments: R hemiplegia, increased R hamstring tone Restrictions Weight Bearing Restrictions: No  See Function Navigator for Current Functional Status.   Therapy/Group: Individual Therapy  Sarika Baldini K Arnette 03/22/2018, 3:52 PM

## 2018-03-22 NOTE — Progress Notes (Signed)
Occupational Therapy Session Note  Patient Details  Name: Kyle Richardson MRN: [id number] Date of Birth: Oct 29, 1927  Today's Date: 03/22/2018 OT Individual Time: 1100-1157 OT Individual Time Calculation (min): 57 min   Short Term Goals: Week 4:  OT Short Term Goal 1 (Week 4): STG=LTG 2/2 ELOS  Skilled Therapeutic Interventions/Progress Updates:    Pt greeted semi-reclined in bed and agreeable to OT. Pt incontinent of urine and soaked through to the bed sheets. Stand-pivot to wc on L side with multimodal cues but overall min A. Pt brought into bathroom in wc and completed stand-pivot to shower bench on R side with Mod A and increased cues for R body awareness and when to step with R LE. Applied wash mit to R UE and worked on neuro re-ed with increased smoothness and accuracy when washing body. Pt needed multimodal cues to move onto other body parts 2/2 perseveration. Stand-pivot out of shower with min A to the L and multimodal cues for body awareness. Worked on B UE coordination with pt handing items from L hand to R hand within grooming tasks. Pt able to maintain grasp on deodorant today with R UE and apply appropriately under arm. Pt also able to thread B UEs into shirt today with only touching assist. Pt also able to thread BLEs into pants, stand with mn A, and maintain standing with min A while pulling pants up. Pt left seated in wc at end of session with safety belt on and needs met.   Therapy Documentation Precautions:  Precautions Precautions: Fall Precaution Comments: R hemiplegia, increased R hamstring tone Restrictions Weight Bearing Restrictions: No Pain:  none/denies pain ADL: ADL ADL Comments: Please see functional navigator   See Function Navigator for Current Functional Status.   Therapy/Group: Individual Therapy  Valma Cava 03/22/2018, 11:43 AM

## 2018-03-23 LAB — GLUCOSE, CAPILLARY
GLUCOSE-CAPILLARY: 121 mg/dL — AB (ref 65–99)
GLUCOSE-CAPILLARY: 221 mg/dL — AB (ref 65–99)
GLUCOSE-CAPILLARY: 151 mg/dL — AB (ref 65–99)
Glucose-Capillary: 143 mg/dL — ABNORMAL HIGH (ref 65–99)

## 2018-03-23 NOTE — Progress Notes (Signed)
  Subjective/Complaints:  Sleeping.  Easily awakened.  He follows commands but does not answer questions verbally.   Objective: Vital Signs: Blood pressure (!) 155/54, pulse (!) 51, temperature 98 F (36.7 C), temperature source Oral, resp. rate 18, height 5\' 8"  (1.727 m), weight 161 lb 6 oz (73.2 kg), SpO2 97 %.  Elderly male in no acute distress.  HEENT exam atraumatic, normocephalic Neck is supple Chest clear to auscultation Cardiac exam S1-S2 regular Abdominal exam active bowel sounds, soft Extremities without significant edema..   Assessment/Plan: 1. Functional deficits secondary to left PCA infarct with right hemiparesis, receptive aphasia   Medical Problem List and Plan: 1.   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  Will continue current inpatient rehab. 2.  DVT Prophylaxis/Anticoagulation: Continue Eliquis. 3. Pain Management: tylenol prn.  4. Mood: Team to provide ego support to help with frustration.  LCSW to follow for evaluation and support when appropriate.  On Prozac-neuropsych f/u 5. Neuropsych: This patient is not capable of making decisions on his own behalf. 6. Skin/Wound Care: routine pressure relief measures.  7. Fluids/Electrolytes/Nutrition: Monitor I/Os.  Basic Metabolic Panel:    Component Value Date/Time   NA 141 03/18/2018 0521   NA 140 03/23/2014 2024   K 3.9 03/18/2018 0521   K 3.8 03/23/2014 2024   CL 108 03/18/2018 0521   CL 104 03/23/2014 2024   CO2 26 03/18/2018 0521   CO2 27 03/23/2014 2024   BUN 20 03/18/2018 0521   BUN 13 03/23/2014 2024   CREATININE 0.84 03/18/2018 0521   CREATININE 0.97 03/23/2014 2024   GLUCOSE 127 (H) 03/18/2018 0521   GLUCOSE 156 04/16/2015 1220   CALCIUM 8.7 (L) 03/18/2018 0521   CALCIUM 8.7 03/23/2014 2024     8. PAF/CAD/HTN: We will continue to monitor for the time being.  There are times where his blood pressure Vitals:   03/22/18 2138 03/23/18 0559  BP: (!) 136/59  (!) 155/54  Pulse: (!) 56 (!) 51  Resp: 19 18  Temp: 98 F (36.7 C)   SpO2: 99% 97%   9. T2DM: Hgb A1C- 7.1.    Will monitor for now.  We will continue sliding scale insulin. CBG (last 3)  Recent Labs    03/22/18 1850 03/22/18 2139 03/23/18 0628  GLUCAP 207* 143* 121*    10. Myasthenia Gravis with intermittent diplopia: On mestonin 30 mg bid--to be increased to 60 mg bid per discharge summary, daughter doesn't want him to have this, had EMG confirmation. Monitor for fatiguability, ocular symptoms--- but appears to be tolerating therapy thus far,   -Consider revisiting treatments with daughter if further symptoms arise as pt cannot make a decision, pt also cannot provide subjective symptoms 11. Glaucoma: Continue Ocuvite bid and  Xalatan at bedtime.  12.  Hypoalb- prostat 13.  Insomnia-improved.  LOS (Days) 24 A FACE TO FACE EVALUATION WAS PERFORMED  Kyle Richardson 03/23/2018, 9:17 AM

## 2018-03-24 ENCOUNTER — Inpatient Hospital Stay (HOSPITAL_COMMUNITY): Payer: PPO | Admitting: Occupational Therapy

## 2018-03-24 LAB — GLUCOSE, CAPILLARY
GLUCOSE-CAPILLARY: 149 mg/dL — AB (ref 65–99)
GLUCOSE-CAPILLARY: 139 mg/dL — AB (ref 65–99)
Glucose-Capillary: 124 mg/dL — ABNORMAL HIGH (ref 65–99)
Glucose-Capillary: 104 mg/dL — ABNORMAL HIGH (ref 65–99)

## 2018-03-24 NOTE — Progress Notes (Signed)
Social Work Patient ID: Kyle Richardson, male   DOB: Aug 01, 1928, 84 y.o.   MRN: [id number]   CSW met with pt 03-20-18 after conference to let him know that targeted d/c date remains the same 03-28-18.  Pt is pleased with this and gave CSW much better eye contact and his communication was better.  CSW tried to update dtr, but had to leave a message stating that pt may need a ramp for two stairs to enter the home and that CSW would like to schedule time for caregivers to come to see pt in therapy and received education from therapists.  CSW remains available to assist as needed.

## 2018-03-24 NOTE — Progress Notes (Signed)
  Subjective/Complaints:  Alert and awake sitting up in bed.  He is eating.  He is quite hard of hearing but does seem to answer yes or no questions appropriately.  Objective: Vital Signs: Blood pressure (!) 148/62, pulse (!) 53, temperature (!) 97.5 F (36.4 C), temperature source Oral, resp. rate 16, height 5\' 8"  (1.727 m), weight 161 lb 6 oz (73.2 kg), SpO2 95 %.  Elderly male in no acute distress. HEENT exam atraumatic, normocephalic Neck supple Chest clear to auscultation Cardiac exam S1-S2 regular Abdominal exam active bowel sounds, soft Extremities without edema.   Assessment/Plan: 1. Functional deficits secondary to left PCA infarct with right hemiparesis, receptive aphasia   Medical Problem List and Plan: 1.   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  Cir  2.  DVT Prophylaxis/Anticoagulation: Continue Eliquis. 3. Pain Management: tylenol prn.  4. Mood: Continue with ego support.  Continue Prozac.  5. Neuropsych: This patient is not capable of making decisions on his own behalf. 6. Skin/Wound Care: routine pressure relief measures.  7. Fluids/Electrolytes/Nutrition: Monitor I/Os.  Basic Metabolic Panel:    Component Value Date/Time   NA 141 03/18/2018 0521   NA 140 03/23/2014 2024   K 3.9 03/18/2018 0521   K 3.8 03/23/2014 2024   CL 108 03/18/2018 0521   CL 104 03/23/2014 2024   CO2 26 03/18/2018 0521   CO2 27 03/23/2014 2024   BUN 20 03/18/2018 0521   BUN 13 03/23/2014 2024   CREATININE 0.84 03/18/2018 0521   CREATININE 0.97 03/23/2014 2024   GLUCOSE 127 (H) 03/18/2018 0521   GLUCOSE 156 04/16/2015 1220   CALCIUM 8.7 (L) 03/18/2018 0521   CALCIUM 8.7 03/23/2014 2024     8. PAF/CAD/HTN: We will continue to monitor for the time being.  Blood pressure is adequately controlled for now. Vitals:   03/23/18 2050 03/24/18 0518  BP: (!) 142/63 (!) 148/62  Pulse: (!) 57 (!) 53  Resp: 17 16  Temp: 97.7 F (36.5 C) (!) 97.5  F (36.4 C)  SpO2: 97% 95%   9. T2DM: Hgb A1C- 7.1.    Adequately controlled. CBG (last 3)  Recent Labs    03/23/18 1649 03/23/18 2153 03/24/18 0618  GLUCAP 143* 151* 124*    10. Myasthenia continue current medications. 11. Glaucoma: Continue Ocuvite bid and  Xalatan at bedtime.  12.  Hypoalb- prostat 13.  Insomnia-improved.  LOS (Days) 25 A FACE TO FACE EVALUATION WAS PERFORMED  Bertie Simien H Ellery Tash 03/24/2018, 8:24 AM

## 2018-03-24 NOTE — Progress Notes (Signed)
Occupational Therapy Session Note  Patient Details  Name: Kyle Richardson MRN: [id number] Date of Birth: 1927/11/30  Today's Date: 03/24/2018 OT Individual Time:  - 0920-1030   (80 min)       Short Term Goals: Week 1:  OT Short Term Goal 1 (Week 1): Pt will locate 2/4 grooming items on R side of the sink with mod instructional cues. OT Short Term Goal 1 - Progress (Week 1): Progressing toward goal OT Short Term Goal 2 (Week 1): Pt will complete toilet transfer with max A of 1 caregiver. OT Short Term Goal 2 - Progress (Week 1): Progressing toward goal OT Short Term Goal 3 (Week 1): Pt will wash 4/10 body parts with multimodal cues and set-up A OT Short Term Goal 3 - Progress (Week 1): Met OT Short Term Goal 4 (Week 1): Pt will complete 1/4 steps to don shirt with min instructional cues. OT Short Term Goal 4 - Progress (Week 1): Met Week 2:  OT Short Term Goal 1 (Week 2): Pt will locate 1/4 grooming items on R side of the sink with mod instructional cues. OT Short Term Goal 1 - Progress (Week 2): Met OT Short Term Goal 2 (Week 2): Pt will complete toilet transfer with max A of 1 caregiver. OT Short Term Goal 2 - Progress (Week 2): Met OT Short Term Goal 3 (Week 2): Pt will tolerate standing at the sink for 3 mins with no more than mod A  in preparation for BADL task OT Short Term Goal 3 - Progress (Week 2): Met  Skilled Therapeutic Interventions/Progress Updates:    Pt greeted semi-reclined in bed and agreeable to OT treatment session.  Pt without hearing aids and many bouts of repetition were needed throughout session.  Pt agreed to shower.  Supine to EOB with physical tapping cues (mod assist) to attend to RUE and forward pelvis motion.  Pt  Sat EOB with supervision.  Transferred to Tilt n space wc to left with mod assist and physical cues for R side stabilization and guiding.  Transferred to shower bench to right with mod assist and max physical cues.  Pt bathed self primarily with LUE even   mitt on RUE and physical cues to use RUE.  He Transferred back to wc to left side with max assist and 2nd person for SBA.  Dressed at sink.  Performed sit to stand to don pants with mod assist.  Maintained standing balance for 2-3 minutes with min to SBA while OT donned briefs and pulled up pants.  Left pt in wc with safety belt on and all needs in reach.  Educated pt throughout session on using RUE and pushing in downward motion.  He continues to need practice and assistance with this intervention.       Therapy Documentation Precautions:  Precautions Precautions: Fall Precaution Comments: R hemiplegia, increased R hamstring tone Restrictions Weight Bearing Restrictions: No    Pain:  none   ADL: ADL ADL Comments: Please see functional navigator  Vision:  Gaze to left 90 % of time.  Could track to midline.   Perception :  Decreased body awareness, sensation, and attention to right.              See Function Navigator for Current Functional Status.   Therapy/Group: Individual Therapy  Lisa Roca 03/24/2018, 10:35 AM

## 2018-03-24 NOTE — Patient Care Conference (Signed)
Inpatient RehabilitationTeam Conference and Plan of Care Update Date: 03/19/2018   Time: 11:00 AM    Patient Name: Kyle Richardson      Medical Record Number: [id number]  Date of Birth: August 11, 1928 Sex: Male         Room/Bed: 4W24C/4W24C-01 Payor Info: Payor: [employer] / Plan: [employer] / Product Type: *No Product type* /    Admitting Diagnosis: L CVA  Admit Date/Time:  02/27/2018  3:28 PM Admission Comments: No comment available   Primary Diagnosis:  Acute ischemic left PCA stroke (HCC) Principal Problem: Acute ischemic left PCA stroke [facility])  Patient Active Problem List   Diagnosis Date Noted  . Labile blood pressure   . Acute ischemic left PCA stroke ([place]) 02/27/2018  . Diabetes mellitus type 2 in nonobese (HCC)   . Glaucoma   . MG, ocular (myasthenia gravis) ([place])   . CVA (cerebral vascular accident) ([place]) 02/24/2018  . Right homonymous hemianopsia 02/23/2018  . Unresponsive episode 02/22/2018  . UTI (urinary tract infection) 01/28/2018  . Abnormal chest CT 09/20/2017  . Double vision 01/21/2017  . Ascending aortic aneurysm ([place]) 12/22/2016  . Blood-tinged sputum 10/23/2016  . Cough 10/23/2016  . PAF (paroxysmal atrial fibrillation) ([place]) 09/14/2016  . Health care maintenance 12/26/2014  . Stress 07/05/2014  . Migraine 05/10/2013  . Type 2 diabetes mellitus with complication, without long-term current use of insulin ([place]) 08/20/2012  . Hypertension 08/20/2012  . Hypercholesterolemia 08/20/2012  . CAD (coronary artery disease) 08/20/2012    Expected Discharge Date: Expected Discharge Date: 03/28/18  Team Members Present: Physician leading conference: Dr. Alysia Penna Social Worker Present: Alfonse Alpers, LCSW Nurse Present: Dorthula Nettles, RN PT Present: Michaelene Song, PT OT Present: Cherylynn Ridges, OT SLP Present: Weston Anna, SLP PPS Coordinator present : Daiva Nakayama, RN, CRRN     Current Status/Progress Goal Weekly Team Focus   Medical   Diabetic control is good, blood pressure control is good, aphasia improving slightly, right field cut with partial compensation  Maintain medical stability reduce burden of care  Encourage attention to the right side.   Bowel/Bladder   incont b/b; lbm 6/11  cont of b/b with mod assist  assess q shift and prn; timed toileting q3hr   Swallow/Nutrition/ Hydration             ADL's   Mod A overall, can stand min A, improved apraxia and R attention  Min A  R NMR, R attention, R visual scanning, apraxia, transfers, modified bathing/dressing, pt/family education   Mobility   mod assist for transfers, bed mobility and gait up to 80 ft with RW  min assist  gait, R NMR, transfers, d/c planning, fam ed   Communication   Min-Mod A  Overall Min A  mildly complex auditory comprehension, verbal expression during structured tasks   Safety/Cognition/ Behavioral Observations  Mod-Max A  Min A   visual scanning, attention, problem solving and recall    Pain   no c/o pain  faces <4  assess q shift and prn   Skin   masd perineum; barrier cream  Free from infection/skin breakdown with min assist  assess q shift and prn    Rehab Goals Patient on target to meet rehab goals: Yes Rehab Goals Revised: none *See Care Plan and progress notes for long and short-term goals.     Barriers to Discharge  Current Status/Progress Possible Resolutions Date Resolved   Physician    Medical stability  Severe sensory deficits with  proprioception and light touch on the right side  Progressing towards discharge goals  Continue rehab      Nursing                  PT                    OT                  SLP                SW                Discharge Planning/Teaching Needs:  Pt's family intends to take pt home and family/paid caregivers will provide 24/7 minimal assistance.  Family/caregivers would come in prior to discharge to receive family education in order to care for pt properly.   Team  Discussion:  Pt is attending better to the right, but he still does not have much sensation on his right side.  Pt is continent with timed toileting and is aware of the need to use the bathroom.  Pt is doing well with OT with moe control of his RUE and he looks at people more often, although he still is challenged by his vision.  Pt is mod A with rolling walker for 22' with guiding of right side with cueing.  Pt does better with 1 step commands/cues and extra time.  Pt has 2 steps to enter home and PT feel he may need a ramp.  Pt is doing better with verbal communication, but can't read or identify words.  He can spell words well, though, vision is more the issue.   Revisions to Treatment Plan:  none    Continued Need for Acute Rehabilitation Level of Care: The patient requires daily medical management by a physician with specialized training in physical medicine and rehabilitation for the following conditions: Daily direction of a multidisciplinary physical rehabilitation program to ensure safe treatment while eliciting the highest outcome that is of practical value to the patient.: Yes Daily medical management of patient stability for increased activity during participation in an intensive rehabilitation regime.: Yes Daily analysis of laboratory values and/or radiology reports with any subsequent need for medication adjustment of medical intervention for : Neurological problems  Kyle Richardson, Kyle Richardson 03/24/2018, 12:13 AM

## 2018-03-25 ENCOUNTER — Inpatient Hospital Stay (HOSPITAL_COMMUNITY): Payer: PPO

## 2018-03-25 ENCOUNTER — Inpatient Hospital Stay (HOSPITAL_COMMUNITY): Payer: PPO | Admitting: Speech Pathology

## 2018-03-25 ENCOUNTER — Inpatient Hospital Stay (HOSPITAL_COMMUNITY): Payer: PPO | Admitting: Occupational Therapy

## 2018-03-25 LAB — BASIC METABOLIC PANEL
ANION GAP: 6 (ref 5–15)
BUN: 29 mg/dL — ABNORMAL HIGH (ref 6–20)
CALCIUM: 8.6 mg/dL — AB (ref 8.9–10.3)
CO2: 26 mmol/L (ref 22–32)
CREATININE: 0.91 mg/dL (ref 0.61–1.24)
Chloride: 106 mmol/L (ref 101–111)
GFR calc non Af Amer: >60 mL/min (ref >60)
Glucose, Bld: 130 mg/dL — ABNORMAL HIGH (ref 65–99)
Potassium: 3.9 mmol/L (ref 3.5–5.1)
SODIUM: 138 mmol/L (ref 135–145)

## 2018-03-25 LAB — CBC
HEMATOCRIT: 42.6 % (ref 39.0–52.0)
HEMOGLOBIN: 13.8 g/dL (ref 13.0–17.0)
MCH: 31 pg (ref 26.0–34.0)
MCHC: 32.4 g/dL (ref 30.0–36.0)
MCV: 95.7 fL (ref 78.0–100.0)
Platelets: 173 10*3/uL (ref 150–400)
RBC: 4.45 MIL/uL (ref 4.22–5.81)
RDW: 13.1 % (ref 11.5–15.5)
WBC: 5.2 10*3/uL (ref 4.0–10.5)

## 2018-03-25 LAB — GLUCOSE, CAPILLARY
GLUCOSE-CAPILLARY: 134 mg/dL — AB (ref 65–99)
GLUCOSE-CAPILLARY: 145 mg/dL — AB (ref 65–99)
Glucose-Capillary: 116 mg/dL — ABNORMAL HIGH (ref 65–99)
Glucose-Capillary: 195 mg/dL — ABNORMAL HIGH (ref 65–99)

## 2018-03-25 NOTE — Progress Notes (Signed)
Speech Language Pathology Daily Session Note  Patient Details  Name: Kyle Richardson MRN: [id number] Date of Birth: [birth year]  Today's Date: 03/25/2018 SLP Individual Time: 0815-0910 SLP Individual Time Calculation (min): 55 min  Short Term Goals: Week 4: SLP Short Term Goal 1 (Week 4): Patient will attend to right field of enviornment during functional tasks with supervision A multimodal cues.  SLP Short Term Goal 2 (Week 4): Patient will demonstrate selective attention to tasks for ~30 minutes with Min A verbal cues for redirection in a mildly distracting enviornment.  SLP Short Term Goal 3 (Week 4): Patient will demonstrate functional problem solving for basic and familiar tasks with Min A verbal cues.  SLP Short Term Goal 4 (Week 4): Patient will self-monitor and correct verbal errors at the phrase level during strutured tasks with Min A verbal cues.  SLP Short Term Goal 5 (Week 4): Patient will follow 2-step commands with Mod A verbal cues.   Skilled Therapeutic Interventions: Skilled treatment session focused on speech goals. SLP facilitated session by providing extra time and Min A verbal cues for patient to name functional items in pictures. Patient demonstrated increased ability to compensate for visual-perceptual deficits which impacted his ability to identify items in pictures. However, patient continues to require total A to decode the word level. Throughout task, patient attended to right field of environment with Min A verbal cues and extra time. Patient appeared to demonstrate increased difficulty hearing today, however, hearing aids appeared to be working. Patient left upright in bed with all needs within reach. Continue with current plan of care.      Function:  Cognition Comprehension Comprehension assist level: Understands basic 75 - 89% of the time/ requires cueing 10 - 24% of the time  Expression   Expression assist level: Expresses basic 75 - 89% of the time/requires  cueing 10 - 24% of the time. Needs helper to occlude trach/needs to repeat words.  Social Interaction Social Interaction assist level: Interacts appropriately 75 - 89% of the time - Needs redirection for appropriate language or to initiate interaction.  Problem Solving Problem solving assist level: Solves basic 50 - 74% of the time/requires cueing 25 - 49% of the time  Memory Memory assist level: Recognizes or recalls 25 - 49% of the time/requires cueing 50 - 75% of the time    Pain Pain Assessment Pain Scale: 0-10 Pain Score: 0-No pain  Therapy/Group: Individual Therapy  Carson Meche 03/25/2018, 12:47 PM

## 2018-03-25 NOTE — Progress Notes (Signed)
Occupational Therapy Session Note  Patient Details  Name: Kyle Richardson MRN: [id number] Date of Birth: 1928-05-28  Today's Date: 03/25/2018 OT Individual Time: 1105-1230 OT Individual Time Calculation (min): 85 min   Short Term Goals: Week 4:  OT Short Term Goal 1 (Week 4): STG=LTG 2/2 ELOS  Skilled Therapeutic Interventions/Progress Updates:    Pt greeted semi-reclined in bed asleep, easy to wake and agreeable to OT. Pt came to sitting EOB with increased time to initiate and min A to bring R side of body around. Pt needed 3 trials to come to standing, then required min and verbal cues to pivot to the L. Pt noted to be incontinent of urine, but stated he did not need to go to the bathroom. Stand-pivot into shower with Mod A to the R with verbal cues and facilitation for head/hips relationship. OT assisted with donning wash mit on R UE, then pt needed hand over hand to initiate use, but was then able to carry through and keep washing body with R UE. Verbal cues to wash all body parts. Min A to transfer out of shower to the L. Dressing completed wc level at the sink. Worked on R apraxia and R attention within dressing tasks. Multiple sit<>stands with min A and tactilce/verbal cues for anterior weight shift. Self- feeding seated in wc for lunch. OT facilitated R visual scanning and R attention by placing lunch plate and drink cup on R side. Pt initiated looking to the R on 50% of occasions without cues. Worked on stabbing food items with utensil instrad of scooping every bite. Pt left seated in wc at end of session with nurse tech present and needs met.   Therapy Documentation Precautions:  Precautions Precautions: Fall Precaution Comments: R hemiplegia, increased R hamstring tone Restrictions Weight Bearing Restrictions: No Pain: Pain Assessment Pain Scale: 0-10 Pain Score: 0-No pain ADL: ADL ADL Comments: Please see functional navigator   See Function Navigator for Current Functional  Status.   Therapy/Group: Individual Therapy  Valma Cava 03/25/2018, 12:13 PM

## 2018-03-25 NOTE — Progress Notes (Signed)
Subjective/Complaints:  Patient remains a phasic working with speech therapy.  He is able to recognize pictures better using flashcards  ROS: Limited due to cognitive/behavioral    Objective: Vital Signs: Blood pressure 109/62, pulse 66, temperature 97.6 F (36.4 C), temperature source Oral, resp. rate 18, height '5\' 8"'  (1.727 m), weight 73.2 kg (161 lb 6 oz), SpO2 95 %. No results found. Results for orders placed or performed during the hospital encounter of 02/27/18 (from the past 72 hour(s))  Glucose, capillary     Status: Abnormal   Collection Time: 03/22/18  6:50 PM  Result Value Ref Range   Glucose-Capillary 207 (H) 65 - 99 mg/dL  Glucose, capillary     Status: Abnormal   Collection Time: 03/22/18  9:39 PM  Result Value Ref Range   Glucose-Capillary 143 (H) 65 - 99 mg/dL  Glucose, capillary     Status: Abnormal   Collection Time: 03/23/18  6:28 AM  Result Value Ref Range   Glucose-Capillary 121 (H) 65 - 99 mg/dL  Glucose, capillary     Status: Abnormal   Collection Time: 03/23/18 11:39 AM  Result Value Ref Range   Glucose-Capillary 221 (H) 65 - 99 mg/dL  Glucose, capillary     Status: Abnormal   Collection Time: 03/23/18  4:49 PM  Result Value Ref Range   Glucose-Capillary 143 (H) 65 - 99 mg/dL  Glucose, capillary     Status: Abnormal   Collection Time: 03/23/18  9:53 PM  Result Value Ref Range   Glucose-Capillary 151 (H) 65 - 99 mg/dL  Glucose, capillary     Status: Abnormal   Collection Time: 03/24/18  6:18 AM  Result Value Ref Range   Glucose-Capillary 124 (H) 65 - 99 mg/dL  Glucose, capillary     Status: Abnormal   Collection Time: 03/24/18 11:51 AM  Result Value Ref Range   Glucose-Capillary 149 (H) 65 - 99 mg/dL  Glucose, capillary     Status: Abnormal   Collection Time: 03/24/18  4:52 PM  Result Value Ref Range   Glucose-Capillary 104 (H) 65 - 99 mg/dL  Glucose, capillary     Status: Abnormal   Collection Time: 03/24/18  9:35 PM  Result Value Ref Range    Glucose-Capillary 139 (H) 65 - 99 mg/dL  CBC     Status: None   Collection Time: 03/25/18  5:30 AM  Result Value Ref Range   WBC 5.2 4.0 - 10.5 K/uL   RBC 4.45 4.22 - 5.81 MIL/uL   Hemoglobin 13.8 13.0 - 17.0 g/dL   HCT 42.6 39.0 - 52.0 %   MCV 95.7 78.0 - 100.0 fL   MCH 31.0 26.0 - 34.0 pg   MCHC 32.4 30.0 - 36.0 g/dL   RDW 13.1 11.5 - 15.5 %   Platelets 173 150 - 400 K/uL    Comment: Performed at [facility], [address], [place], [place] [address]  Basic metabolic panel     Status: Abnormal   Collection Time: 03/25/18  5:30 AM  Result Value Ref Range   Sodium 138 135 - 145 mmol/L   Potassium 3.9 3.5 - 5.1 mmol/L   Chloride 106 101 - 111 mmol/L   CO2 26 22 - 32 mmol/L   Glucose, Bld 130 (H) 65 - 99 mg/dL   BUN 29 (H) 6 - 20 mg/dL   Creatinine, Ser 0.91 0.61 - 1.24 mg/dL   Calcium 8.6 (L) 8.9 - 10.3 mg/dL   GFR calc non Af  Amer >60 >60 mL/min   GFR calc Af Amer >60 >60 mL/min    Comment: (NOTE) The eGFR has been calculated using the CKD EPI equation. This calculation has not been validated in all clinical situations. eGFR's persistently <60 mL/min signify possible Chronic Kidney Disease.    Anion gap 6 5 - 15    Comment: Performed at [facility] [address] [place], [place] [address]  Glucose, capillary     Status: Abnormal   Collection Time: 03/25/18  6:36 AM  Result Value Ref Range   Glucose-Capillary 134 (H) 65 - 99 mg/dL  Glucose, capillary     Status: Abnormal   Collection Time: 03/25/18 11:43 AM  Result Value Ref Range   Glucose-Capillary 116 (H) 65 - 99 mg/dL  Glucose, capillary     Status: Abnormal   Collection Time: 03/25/18  4:35 PM  Result Value Ref Range   Glucose-Capillary 145 (H) 65 - 99 mg/dL     Constitutional: No distress . Vital signs reviewed. HEENT: EOMI, oral membranes moist Neck: supple Cardiovascular: RRR without murmur. No JVD    Respiratory: CTA Bilaterally without wheezes or rales. Normal effort    GI: BS +,  non-tender, non-distended  Musc: No edema or tenderness in extremities. Neuro: Alert.  Word finding deficits Motor: Limited due to participation and apraxia ?RUE: 0/5 proximal distal ?RLE: Hip flexion, knee extension, ADF 4/5 ?LLE: 4+/5 proximal to distal  .   Assessment/Plan: 1. Functional deficits secondary to left PCA infarct with right hemiparesis, receptive aphasia which require 3+ hours per day of interdisciplinary therapy in a comprehensive inpatient rehab setting. Physiatrist is providing close team supervision and 24 hour management of active medical problems listed below. Physiatrist and rehab team continue to assess barriers to discharge/monitor patient progress toward functional and medical goals. FIM: Function - Bathing Bathing activity did not occur: N/A Position: Shower Body parts bathed by patient: Chest, Abdomen, Front perineal area, Right arm, Left arm, Right upper leg, Left upper leg, Right lower leg, Left lower leg, Buttocks Body parts bathed by helper: Back Assist Level: Touching or steadying assistance(Pt > 75%)  Function- Upper Body Dressing/Undressing Upper body dressing/undressing activity did not occur: N/A What is the patient wearing?: Pull over shirt/dress Pull over shirt/dress - Perfomed by patient: Thread/unthread right sleeve, Thread/unthread left sleeve, Put head through opening, Pull shirt over trunk Pull over shirt/dress - Perfomed by helper: Thread/unthread right sleeve Assist Level: Touching or steadying assistance(Pt > 75%) Function - Lower Body Dressing/Undressing Lower body dressing/undressing activity did not occur: N/A What is the patient wearing?: Pants, Socks, Shoes Position: Wheelchair/chair at sink Underwear - Performed by helper: Thread/unthread left underwear leg, Pull underwear up/down Pants- Performed by patient: Pull pants up/down, Thread/unthread left pants leg, Thread/unthread right pants leg Pants- Performed by helper:  Thread/unthread right pants leg Non-skid slipper socks- Performed by helper: Don/doff right sock, Don/doff left sock Socks - Performed by patient: Don/doff left sock Socks - Performed by helper: Don/doff right sock Shoes - Performed by patient: Don/doff left shoe, Don/doff right shoe Shoes - Performed by helper: Fasten left, Fasten right Assist for footwear: Partial/moderate assist Assist for lower body dressing: Touching or steadying assistance (Pt > 75%)  Function - Toileting Toileting activity did not occur: N/A Toileting steps completed by helper: Adjust clothing prior to toileting, Performs perineal hygiene, Adjust clothing after toileting Toileting Assistive Devices: Grab bar or rail Assist level: Touching or steadying assistance (Pt.75%)  Function - Air cabin crew transfer  activity did not occur: N/A(this did not occur with me today) Toilet transfer assistive device: Mechanical lift Mechanical lift: Stedy Assist level to toilet: 2 helpers Assist level from toilet: 2 helpers  Function - Chair/bed transfer Chair/bed transfer activity did not occur: N/A(this did not occur with me today) Chair/bed transfer method: Stand pivot Chair/bed transfer assist level: Moderate assist (Pt 50 - 74%/lift or lower) Chair/bed transfer assistive device: Armrests, Walker Mechanical lift: Stedy Chair/bed transfer details: Tactile cues for placement, Tactile cues for weight shifting, Manual facilitation for weight shifting, Manual facilitation for placement, Manual facilitation for weight bearing  Function - Locomotion: Wheelchair Will patient use wheelchair at discharge?: Yes Type: Manual Wheelchair activity did not occur: Safety/medical concerns Max wheelchair distance: 70 Assist Level: Touching or steadying assistance (Pt > 75%) Wheel 50 feet with 2 turns activity did not occur: Safety/medical concerns Assist Level: Touching or steadying assistance (Pt > 75%) Wheel 150 feet activity  did not occur: Safety/medical concerns Assist Level: Touching or steadying assistance (Pt > 75%) Turns around,maneuvers to table,bed, and toilet,negotiates 3% grade,maneuvers on rugs and over doorsills: No Function - Locomotion: Ambulation Ambulation activity did not occur: N/A Assistive device: Walker-rolling, Orthosis Max distance: 60' Assist level: Moderate assist (Pt 50 - 74%) Walk 10 feet activity did not occur: Safety/medical concerns Assist level: Moderate assist (Pt 50 - 74%) Walk 50 feet with 2 turns activity did not occur: Safety/medical concerns Assist level: Moderate assist (Pt 50 - 74%) Walk 150 feet activity did not occur: Safety/medical concerns Walk 10 feet on uneven surfaces activity did not occur: Safety/medical concerns  Function - Comprehension Comprehension: Auditory Comprehension assistive device: Hearing aids Comprehension assist level: Understands basic 75 - 89% of the time/ requires cueing 10 - 24% of the time  Function - Expression Expression: Verbal Expression assist level: Expresses basic 75 - 89% of the time/requires cueing 10 - 24% of the time. Needs helper to occlude trach/needs to repeat words.  Function - Social Interaction Social Interaction assist level: Interacts appropriately 75 - 89% of the time - Needs redirection for appropriate language or to initiate interaction.  Function - Problem Solving Problem solving assist level: Solves basic 50 - 74% of the time/requires cueing 25 - 49% of the time  Function - Memory Memory assist level: Recognizes or recalls 25 - 49% of the time/requires cueing 50 - 75% of the time Patient normally able to recall (first 3 days only): That he or she is in a hospital   Medical Problem List and Plan: 1.  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  Cont CIR-PT, OT, SLP-apraxia has been improving, aphasia mainly receptive 2.  DVT Prophylaxis/Anticoagulation:  Eliquis  will serve as  DVT prophylaxis as well 3. Pain Management: tylenol prn.  4. Mood: Team to provide ego support to help with frustration.  LCSW to follow for evaluation and support when appropriate.  On Prozac-neuropsych f/u 5. Neuropsych: This patient is not capable of making decisions on his own behalf. 6. Skin/Wound Care: routine pressure relief measures.  7. Fluids/Electrolytes/Nutrition: Monitor I/Os. Added full supervision at meals for safety., BMET within acceptable range on 5/27 and 6/3, I 1080 a ml recorded with meals yesterday 8. PAF/CAD/HTN: Monitor HR bid. ON metoprolol and Lipitor.  Vitals:   03/25/18 1346 03/25/18 1348  BP:  109/62  Pulse:  66  Resp:  18  Temp: 97.6 F (36.4 C)   SpO2:  95%   low dose amlodipine started 5/28 Good  control 6/17, asymptomatic brady due to BB 9. T2DM: Hgb A1C- 7.1.  Monitor BS ac/hs and use SSI for elevated BS. Metformin d/ced due to GI Side effects CBG (last 3)  Recent Labs    03/25/18 0636 03/25/18 1143 03/25/18 1635  GLUCAP 134* 116* 145*   Good  control 6/17continue SSI 10. Myasthenia Gravis with intermittent diplopia: On mestonin 30 mg bid--to be increased to 60 mg bid per discharge summary, daughter doesn't want him to have this, had EMG confirmation. Monitor for fatiguability, ocular symptoms--- but appears to be tolerating therapy thus far,   -Consider revisiting treatments with daughter if further symptoms arise as pt cannot make a decision, pt also cannot provide subjective symptoms 11. Glaucoma: Continue Ocuvite bid and  Xalatan at bedtime.  12.  Hypoalb- prostat 13.  Insomnia- improved , Per sleep graph 7 hours LOS (Days) 26 A FACE TO FACE EVALUATION WAS PERFORMED  Charlett Blake 03/25/2018, 5:32 PM

## 2018-03-25 NOTE — Plan of Care (Signed)
  Problem: Consults Goal: RH STROKE PATIENT EDUCATION Description See Patient Education module for education specifics  Outcome: Progressing   Problem: RH BOWEL ELIMINATION Goal: RH STG MANAGE BOWEL WITH ASSISTANCE Description STG Manage Bowel with mod Assistance.  Outcome: Progressing Goal: RH STG MANAGE BOWEL W/MEDICATION W/ASSISTANCE Description STG Manage Bowel with Medication with mod Assistance.  Outcome: Progressing   Problem: RH BLADDER ELIMINATION Goal: RH STG MANAGE BLADDER WITH ASSISTANCE Description STG Manage Bladder With mod Assistance  Outcome: Progressing   Problem: RH SKIN INTEGRITY Goal: RH STG SKIN FREE OF INFECTION/BREAKDOWN Description Pt will remain free from skin infection/ breakdown with mod assistance.  Outcome: Progressing Goal: RH STG MAINTAIN SKIN INTEGRITY WITH ASSISTANCE Description STG Maintain Skin Integrity With mod Assistance.  Outcome: Progressing   Problem: RH SAFETY Goal: RH STG ADHERE TO SAFETY PRECAUTIONS W/ASSISTANCE/DEVICE Description STG Adhere to Safety Precautions With mod  Assistance/Device.  Outcome: Progressing   Problem: RH COGNITION-NURSING Goal: RH STG USES MEMORY AIDS/STRATEGIES W/ASSIST TO PROBLEM SOLVE Description STG Uses Memory Aids/Strategies With mod Assistance to Problem Solve.  Outcome: Progressing Goal: RH STG ANTICIPATES NEEDS/CALLS FOR ASSIST W/ASSIST/CUES Description STG Anticipates Needs/Calls for Assist With mod Assistance/Cues.  Outcome: Progressing   Problem: RH KNOWLEDGE DEFICIT Goal: RH STG INCREASE KNOWLEDGE OF DIABETES Description Mod assist  Outcome: Progressing Goal: RH STG INCREASE KNOWLEDGE OF HYPERTENSION Description Mod assist  Outcome: Progressing   

## 2018-03-25 NOTE — Progress Notes (Signed)
Physical Therapy Session Note  Patient Details  Name: Kyle Richardson MRN: [id number] Date of Birth: 1928-05-26  Today's Date: 03/25/2018 PT Individual Time: 1445-1558 PT Individual Time Calculation (min): 73 min   Short Term Goals: Week 4:  PT Short Term Goal 1 (Week 4): =LTGs due to ELOS  Skilled Therapeutic Interventions/Progress Updates:    Pt seated in TIS w/c upon PT arrival, agreeable to therapy tx and denies pain. Pt transported to the gym. Therapist provided pt with manual w/c in order to increase independence and prepare pt for transition home. Pt performed stand pivot transfer from TIS w/c>standard w/c with mod assist and use of RW, verbal cues for R LE awareness and placement. Pt worked on w/c propulsion with B LEs for coordination, supervision-min assist with verbal cues for R side awareness and obstacle avoidance, x 200 ft. Pt ambulated x 60 ft with RW and mod assist, R hand splint, verbal cues for R foot placement and manual facilitation for L lateral weightshift in order to clear R foot. Pt performed stand pivot from w/c<>mat x 2 with RW and mod assist, max verbal cues for sequencing and techniques. Pt propelled w/c to ortho gym with CGA, verbal cues for techniques and environmental awareness. Pt performed stand pivot transfer from w/c<>car with mod assist for each direction, using RW and max verbal cues for sequencing. Pt transported back to room and left seated in standard chair with QRB, RN aware of change to standard w/c vs TIS.   Therapy Documentation Precautions:  Precautions Precautions: Fall Precaution Comments: R hemiplegia, increased R hamstring tone Restrictions Weight Bearing Restrictions: No  See Function Navigator for Current Functional Status.   Therapy/Group: Individual Therapy  Netta Corrigan, PT, DPT 03/25/2018, 7:53 AM

## 2018-03-26 ENCOUNTER — Inpatient Hospital Stay (HOSPITAL_COMMUNITY): Payer: PPO

## 2018-03-26 ENCOUNTER — Inpatient Hospital Stay (HOSPITAL_COMMUNITY): Payer: PPO | Admitting: Speech Pathology

## 2018-03-26 LAB — GLUCOSE, CAPILLARY
GLUCOSE-CAPILLARY: 134 mg/dL — AB (ref 65–99)
GLUCOSE-CAPILLARY: 129 mg/dL — AB (ref 65–99)
GLUCOSE-CAPILLARY: 140 mg/dL — AB (ref 65–99)
Glucose-Capillary: 172 mg/dL — ABNORMAL HIGH (ref 65–99)

## 2018-03-26 NOTE — Progress Notes (Signed)
Occupational Therapy Session Note  Patient Details  Name: Kyle Richardson MRN: [id number] Date of Birth: 12-Oct-1927  Today's Date: 03/26/2018 OT Individual Time: 1100-1200 OT Individual Time Calculation (min): 60 min    Short Term Goals: Week 4:  OT Short Term Goal 1 (Week 4): STG=LTG 2/2 ELOS  Skilled Therapeutic Interventions/Progress Updates:    Pt received supine in bed agreeable to therapy. Session focused on R UE NMR and sequencing during b/d tasks. Throughout bathing, light tactile cues provided to R UE to increase proprioceptive input and decrease over/undershooting. Min A provided during LB bathing for R LE support in flexed hip position to facilitate functional reaching. Pt instructed in uni-manual sock donning technique, with pt returning demo and then independently performing again for R LE. Pt completed sit to stand transfer with manual facilitation for R UE placement on RW orthosis, and vc/tactile for pushing up from bed. Pt required min A for sit to stand transfers overall during session. Pt removed L UE from RW to perform standing level peri-hygiene, anterior and posterior, requiring mod A for static standing balance support. Heavy vc provided throughout for self-correction of upright posture d/t heavy pushing to the R but pt unable to correct with manual cues. Pt used RW to transfer to w/c with mod A overall for stabilization and guiding cues re positioning. HOH performed to bimanually manipulate toothpaste and deodorant. Pt left in w/c with QRB donned and all needs met.   Therapy Documentation Precautions:  Precautions Precautions: Fall Precaution Comments: R hemiplegia, increased R hamstring tone Restrictions Weight Bearing Restrictions: No  Pain: Pain Assessment Pain Scale: 0-10 Pain Score: 0-No pain ADL: ADL ADL Comments: Please see functional navigator   See Function Navigator for Current Functional Status.   Therapy/Group: Individual Therapy  Curtis Sites 03/26/2018, 12:02 PM

## 2018-03-26 NOTE — Plan of Care (Signed)
  Problem: Consults Goal: RH STROKE PATIENT EDUCATION Description See Patient Education module for education specifics  Outcome: Progressing   Problem: RH BOWEL ELIMINATION Goal: RH STG MANAGE BOWEL WITH ASSISTANCE Description STG Manage Bowel with mod Assistance.  Outcome: Progressing Goal: RH STG MANAGE BOWEL W/MEDICATION W/ASSISTANCE Description STG Manage Bowel with Medication with mod Assistance.  Outcome: Progressing   Problem: RH BLADDER ELIMINATION Goal: RH STG MANAGE BLADDER WITH ASSISTANCE Description STG Manage Bladder With mod Assistance  Outcome: Progressing   Problem: RH SKIN INTEGRITY Goal: RH STG SKIN FREE OF INFECTION/BREAKDOWN Description Pt will remain free from skin infection/ breakdown with mod assistance.  Outcome: Progressing Goal: RH STG MAINTAIN SKIN INTEGRITY WITH ASSISTANCE Description STG Maintain Skin Integrity With mod Assistance.  Outcome: Progressing   Problem: RH SAFETY Goal: RH STG ADHERE TO SAFETY PRECAUTIONS W/ASSISTANCE/DEVICE Description STG Adhere to Safety Precautions With mod  Assistance/Device.  Outcome: Progressing   Problem: RH COGNITION-NURSING Goal: RH STG USES MEMORY AIDS/STRATEGIES W/ASSIST TO PROBLEM SOLVE Description STG Uses Memory Aids/Strategies With mod Assistance to Problem Solve.  Outcome: Progressing Goal: RH STG ANTICIPATES NEEDS/CALLS FOR ASSIST W/ASSIST/CUES Description STG Anticipates Needs/Calls for Assist With mod Assistance/Cues.  Outcome: Progressing   Problem: RH KNOWLEDGE DEFICIT Goal: RH STG INCREASE KNOWLEDGE OF DIABETES Description Mod assist  Outcome: Progressing Goal: RH STG INCREASE KNOWLEDGE OF HYPERTENSION Description Mod assist  Outcome: Progressing   

## 2018-03-26 NOTE — Progress Notes (Signed)
Subjective/Complaints:  No issues overnite, slept 8h , discussed with SLP  ROS: Limited due to cognitive/behavioral    Objective: Vital Signs: Blood pressure 114/61, pulse (!) 52, temperature 98.7 F (37.1 C), temperature source Oral, resp. rate 16, height 5' 8" (1.727 m), weight 73.2 kg (161 lb 6 oz), SpO2 91 %. No results found. Results for orders placed or performed during the hospital encounter of 02/27/18 (from the past 72 hour(s))  Glucose, capillary     Status: Abnormal   Collection Time: 03/23/18 11:39 AM  Result Value Ref Range   Glucose-Capillary 221 (H) 65 - 99 mg/dL  Glucose, capillary     Status: Abnormal   Collection Time: 03/23/18  4:49 PM  Result Value Ref Range   Glucose-Capillary 143 (H) 65 - 99 mg/dL  Glucose, capillary     Status: Abnormal   Collection Time: 03/23/18  9:53 PM  Result Value Ref Range   Glucose-Capillary 151 (H) 65 - 99 mg/dL  Glucose, capillary     Status: Abnormal   Collection Time: 03/24/18  6:18 AM  Result Value Ref Range   Glucose-Capillary 124 (H) 65 - 99 mg/dL  Glucose, capillary     Status: Abnormal   Collection Time: 03/24/18 11:51 AM  Result Value Ref Range   Glucose-Capillary 149 (H) 65 - 99 mg/dL  Glucose, capillary     Status: Abnormal   Collection Time: 03/24/18  4:52 PM  Result Value Ref Range   Glucose-Capillary 104 (H) 65 - 99 mg/dL  Glucose, capillary     Status: Abnormal   Collection Time: 03/24/18  9:35 PM  Result Value Ref Range   Glucose-Capillary 139 (H) 65 - 99 mg/dL  CBC     Status: None   Collection Time: 03/25/18  5:30 AM  Result Value Ref Range   WBC 5.2 4.0 - 10.5 K/uL   RBC 4.45 4.22 - 5.81 MIL/uL   Hemoglobin 13.8 13.0 - 17.0 g/dL   HCT 42.6 39.0 - 52.0 %   MCV 95.7 78.0 - 100.0 fL   MCH 31.0 26.0 - 34.0 pg   MCHC 32.4 30.0 - 36.0 g/dL   RDW 13.1 11.5 - 15.5 %   Platelets 173 150 - 400 K/uL    Comment: Performed at [facility], [place]. [address], [place], [place] [address]  Basic metabolic  panel     Status: Abnormal   Collection Time: 03/25/18  5:30 AM  Result Value Ref Range   Sodium 138 135 - 145 mmol/L   Potassium 3.9 3.5 - 5.1 mmol/L   Chloride 106 101 - 111 mmol/L   CO2 26 22 - 32 mmol/L   Glucose, Bld 130 (H) 65 - 99 mg/dL   BUN 29 (H) 6 - 20 mg/dL   Creatinine, Ser 0.91 0.61 - 1.24 mg/dL   Calcium 8.6 (L) 8.9 - 10.3 mg/dL   GFR calc non Af Amer >60 >60 mL/min   GFR calc Af Amer >60 >60 mL/min    Comment: (NOTE) The eGFR has been calculated using the CKD EPI equation. This calculation has not been validated in all clinical situations. eGFR's persistently <60 mL/min signify possible Chronic Kidney Disease.    Anion gap 6 5 - 15    Comment: Performed at [facility] [address] [place], [place] [address]  Glucose, capillary     Status: Abnormal   Collection Time: 03/25/18  6:36 AM  Result Value Ref Range   Glucose-Capillary 134 (H) 65 -  99 mg/dL  Glucose, capillary     Status: Abnormal   Collection Time: 03/25/18 11:43 AM  Result Value Ref Range   Glucose-Capillary 116 (H) 65 - 99 mg/dL  Glucose, capillary     Status: Abnormal   Collection Time: 03/25/18  4:35 PM  Result Value Ref Range   Glucose-Capillary 145 (H) 65 - 99 mg/dL  Glucose, capillary     Status: Abnormal   Collection Time: 03/25/18  8:57 PM  Result Value Ref Range   Glucose-Capillary 195 (H) 65 - 99 mg/dL  Glucose, capillary     Status: Abnormal   Collection Time: 03/26/18  6:18 AM  Result Value Ref Range   Glucose-Capillary 134 (H) 65 - 99 mg/dL     Constitutional: No distress . Vital signs reviewed. HEENT: EOMI, oral membranes moist Neck: supple Cardiovascular: RRR without murmur. No JVD    Respiratory: CTA Bilaterally without wheezes or rales. Normal effort    GI: BS +, non-tender, non-distended  Musc: No edema or tenderness in extremities. Neuro: Alert.  Word finding deficits Motor: Limited due to participation and apraxia ?RUE: 0/5 proximal distal ?RLE: Hip flexion,  knee extension, ADF 4/5 ?LLE: 4+/5 proximal to distal  .   Assessment/Plan: 1. Functional deficits secondary to left PCA infarct with right hemiparesis, receptive aphasia which require 3+ hours per day of interdisciplinary therapy in a comprehensive inpatient rehab setting. Physiatrist is providing close team supervision and 24 hour management of active medical problems listed below. Physiatrist and rehab team continue to assess barriers to discharge/monitor patient progress toward functional and medical goals. FIM: Function - Bathing Bathing activity did not occur: N/A Position: Shower Body parts bathed by patient: Chest, Abdomen, Front perineal area, Right arm, Left arm, Right upper leg, Left upper leg, Right lower leg, Left lower leg, Buttocks Body parts bathed by helper: Back Assist Level: Touching or steadying assistance(Pt > 75%)  Function- Upper Body Dressing/Undressing Upper body dressing/undressing activity did not occur: N/A What is the patient wearing?: Pull over shirt/dress Pull over shirt/dress - Perfomed by patient: Thread/unthread right sleeve, Thread/unthread left sleeve, Put head through opening, Pull shirt over trunk Pull over shirt/dress - Perfomed by helper: Thread/unthread right sleeve Assist Level: Touching or steadying assistance(Pt > 75%) Function - Lower Body Dressing/Undressing Lower body dressing/undressing activity did not occur: N/A What is the patient wearing?: Pants, Socks, Shoes Position: Wheelchair/chair at sink Underwear - Performed by helper: Thread/unthread left underwear leg, Pull underwear up/down Pants- Performed by patient: Pull pants up/down, Thread/unthread left pants leg, Thread/unthread right pants leg Pants- Performed by helper: Thread/unthread right pants leg Non-skid slipper socks- Performed by helper: Don/doff right sock, Don/doff left sock Socks - Performed by patient: Don/doff left sock Socks - Performed by helper: Don/doff right  sock Shoes - Performed by patient: Don/doff left shoe, Don/doff right shoe Shoes - Performed by helper: Fasten left, Fasten right Assist for footwear: Partial/moderate assist Assist for lower body dressing: Touching or steadying assistance (Pt > 75%)  Function - Toileting Toileting activity did not occur: N/A Toileting steps completed by helper: Adjust clothing prior to toileting, Performs perineal hygiene, Adjust clothing after toileting Toileting Assistive Devices: Grab bar or rail Assist level: Touching or steadying assistance (Pt.75%)  Function - Air cabin crew transfer activity did not occur: N/A(this did not occur with me today) Toilet transfer assistive device: Mechanical lift Mechanical lift: Stedy Assist level to toilet: 2 helpers Assist level from toilet: 2 helpers  Function - Chair/bed transfer Chair/bed transfer activity  did not occur: N/A(this did not occur with me today) Chair/bed transfer method: Stand pivot Chair/bed transfer assist level: Moderate assist (Pt 50 - 74%/lift or lower) Chair/bed transfer assistive device: Armrests, Walker Mechanical lift: Stedy Chair/bed transfer details: Tactile cues for placement, Tactile cues for weight shifting, Manual facilitation for weight shifting, Manual facilitation for placement, Manual facilitation for weight bearing  Function - Locomotion: Wheelchair Will patient use wheelchair at discharge?: Yes Type: Manual Wheelchair activity did not occur: Safety/medical concerns Max wheelchair distance: 70 Assist Level: Touching or steadying assistance (Pt > 75%) Wheel 50 feet with 2 turns activity did not occur: Safety/medical concerns Assist Level: Touching or steadying assistance (Pt > 75%) Wheel 150 feet activity did not occur: Safety/medical concerns Assist Level: Touching or steadying assistance (Pt > 75%) Turns around,maneuvers to table,bed, and toilet,negotiates 3% grade,maneuvers on rugs and over doorsills:  No Function - Locomotion: Ambulation Ambulation activity did not occur: N/A Assistive device: Walker-rolling, Orthosis Max distance: 60' Assist level: Moderate assist (Pt 50 - 74%) Walk 10 feet activity did not occur: Safety/medical concerns Assist level: Moderate assist (Pt 50 - 74%) Walk 50 feet with 2 turns activity did not occur: Safety/medical concerns Assist level: Moderate assist (Pt 50 - 74%) Walk 150 feet activity did not occur: Safety/medical concerns Walk 10 feet on uneven surfaces activity did not occur: Safety/medical concerns  Function - Comprehension Comprehension: Auditory Comprehension assistive device: Hearing aids Comprehension assist level: Understands basic 75 - 89% of the time/ requires cueing 10 - 24% of the time  Function - Expression Expression: Verbal Expression assist level: Expresses basic 75 - 89% of the time/requires cueing 10 - 24% of the time. Needs helper to occlude trach/needs to repeat words.  Function - Social Interaction Social Interaction assist level: Interacts appropriately 75 - 89% of the time - Needs redirection for appropriate language or to initiate interaction.  Function - Problem Solving Problem solving assist level: Solves basic 50 - 74% of the time/requires cueing 25 - 49% of the time  Function - Memory Memory assist level: Recognizes or recalls 25 - 49% of the time/requires cueing 50 - 75% of the time Patient normally able to recall (first 3 days only): That he or she is in a hospital   Medical Problem List and Plan: 1.  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  Cont CIR-PT, OT, SLP-team conf in am2.  DVT Prophylaxis/Anticoagulation:  Eliquis  will serve as DVT prophylaxis as well 3. Pain Management: tylenol prn.  4. Mood: Team to provide ego support to help with frustration.  LCSW to follow for evaluation and support when appropriate.  On Prozac-neuropsych f/u 5. Neuropsych: This patient is  not capable of making decisions on his own behalf. 6. Skin/Wound Care: routine pressure relief measures.  7. Fluids/Electrolytes/Nutrition: Monitor I/Os. Added full supervision at meals for safety., BMET normal 6/17 except elevated BUN- fluid intake 814m cont to encourage po fluids 8. PAF/CAD/HTN: Monitor HR bid. ON metoprolol and Lipitor.  Vitals:   03/25/18 1959 03/26/18 0414  BP: 128/64 114/61  Pulse: 63 (!) 52  Resp: 17 16  Temp: 97.8 F (36.6 C) 98.7 F (37.1 C)  SpO2: 95% 91%   low dose amlodipine started 5/28 Good  control 6/18, asymptomatic brady due to BB 9. T2DM: Hgb A1C- 7.1.  Monitor BS ac/hs and use SSI for elevated BS. Metformin d/ced due to GI Side effects CBG (last 3)  Recent Labs    03/25/18 1635 03/25/18  2057 03/26/18 0618  GLUCAP 145* 195* 134*   Good  control 6/18 continue SSI 10. Myasthenia Gravis with intermittent diplopia: On mestonin 30 mg bid--to be increased to 60 mg bid per discharge summary, daughter doesn't want him to have this, had EMG confirmation. Monitor for fatiguability, ocular symptoms--- but appears to be tolerating therapy thus far,   -Consider revisiting treatments with daughter if further symptoms arise as pt cannot make a decision, pt also cannot provide subjective symptoms 11. Glaucoma: Continue Ocuvite bid and  Xalatan at bedtime.  12.  Hypoalb- prostat 13.  Insomnia- improved , Per sleep graph 7 hours LOS (Days) 27 A FACE TO FACE EVALUATION WAS PERFORMED  Charlett Blake 03/26/2018, 8:37 AM

## 2018-03-26 NOTE — Progress Notes (Signed)
Physical Therapy Session Note  Patient Details  Name: Kyle Richardson MRN: [id number] Date of Birth: 1927-11-07  Today's Date: 03/26/2018 PT Individual Time: 1432-1600 PT Individual Time Calculation (min): 88 min   Short Term Goals: Week 4:  PT Short Term Goal 1 (Week 4): =LTGs due to ELOS  Skilled Therapeutic Interventions/Progress Updates:    Pt seated in w/c upon PT arrival, agreeable to therapy tx and denies pain. Pt's daughter present for hands on family training and pt's wife present to observe. Pt performed w/c propulsion using B LEs and min assist-supervision with verbal cues for techniques and R awareness. Session focused on multiple stand pivot transfers with and without RW in a variety of different environments with assist from therapist and then from daughter. Therapist educated pt's daughter on importance of using gait belt for all mobility at home with caregivers. Pt performed stand pivot transfer x 3 in each direction from w/c<>mat with RW and min-mod assist, therapist providing appropriate education on cueing for family to provide. Pt performed x 2 car transfers from w/c<>car with mod assist and RW, therapist providing cues for sequencing and educating family on techniques, daughter provided physical assist for x 1 car transfer. Pt taken to rehab apartment, performed x 1 stand pivot transfer from w/c<>bed without AD with therapist and x 1 stand pivot transfer from w/c<>bed with daughter, pt requiring min assist for transfer to the L and mod assist for transfer to the R. Pt transported to the gym. Pt performed sit<>stands with supervision-min assist, therapist educated daughter on importance for proper set up including feet and hand positioning and providing simple one step commands to the pt. Therapist worked on ambulation with the pt, therapist emphasized to daughter that the pt should only be working on ambulation with therapy at home and the pt should only be performing stand pivot  transfers with caregivers and family. Daughter agreeable. Pt transported back to room and left seated in w/c in care of family, needs in reach.   Therapy Documentation Precautions:  Precautions Precautions: Fall Precaution Comments: R hemiplegia, increased R hamstring tone Restrictions Weight Bearing Restrictions: No   See Function Navigator for Current Functional Status.   Therapy/Group: Individual Therapy  Netta Corrigan, PT, DPT 03/26/2018, 2:36 PM

## 2018-03-26 NOTE — Progress Notes (Signed)
Speech Language Pathology Daily Session Note  Patient Details  Name: Kyle Richardson MRN: [id number] Date of Birth: 01/31/1928  Today's Date: 03/26/2018 SLP Individual Time: 4174-0814 SLP Individual Time Calculation (min): 55 min  Short Term Goals: Week 4: SLP Short Term Goal 1 (Week 4): Patient will attend to right field of enviornment during functional tasks with supervision A multimodal cues.  SLP Short Term Goal 2 (Week 4): Patient will demonstrate selective attention to tasks for ~30 minutes with Min A verbal cues for redirection in a mildly distracting enviornment.  SLP Short Term Goal 3 (Week 4): Patient will demonstrate functional problem solving for basic and familiar tasks with Min A verbal cues.  SLP Short Term Goal 4 (Week 4): Patient will self-monitor and correct verbal errors at the phrase level during strutured tasks with Min A verbal cues.  SLP Short Term Goal 5 (Week 4): Patient will follow 2-step commands with Mod A verbal cues.   Skilled Therapeutic Interventions: Skilled treatment session focused on cognitive-linguistic goals. SLP facilitated session by providing extra time and Min A verbal cues for problem solving during a 4 step picture sequencing task. Patient also required Min A verbal cues for word-finding throughout task. Patient left upright in bed with all needs within reach. Continue      Function:   Cognition Comprehension Comprehension assist level: Understands basic 75 - 89% of the time/ requires cueing 10 - 24% of the time  Expression   Expression assist level: Expresses basic 75 - 89% of the time/requires cueing 10 - 24% of the time. Needs helper to occlude trach/needs to repeat words.  Social Interaction Social Interaction assist level: Interacts appropriately 75 - 89% of the time - Needs redirection for appropriate language or to initiate interaction.  Problem Solving Problem solving assist level: Solves basic 50 - 74% of the time/requires cueing 25 - 49%  of the time  Memory Memory assist level: Recognizes or recalls 25 - 49% of the time/requires cueing 50 - 75% of the time    Pain Pain Assessment Pain Scale: 0-10 Pain Score: 0-No pain  Therapy/Group: Individual Therapy  Sadhana Frater 03/26/2018, 1:06 PM

## 2018-03-26 NOTE — Progress Notes (Signed)
Subjective/Complaints:  No issues overnite, slept 8h , discussed with SLP  ROS: Limited due to cognitive/behavioral    Objective: Vital Signs: Blood pressure 114/61, pulse (!) 52, temperature 98.7 F (37.1 C), temperature source Oral, resp. rate 16, height 5' 8" (1.727 m), weight 73.2 kg (161 lb 6 oz), SpO2 91 %. No results found. Results for orders placed or performed during the hospital encounter of 02/27/18 (from the past 72 hour(s))  Glucose, capillary     Status: Abnormal   Collection Time: 03/23/18 11:39 AM  Result Value Ref Range   Glucose-Capillary 221 (H) 65 - 99 mg/dL  Glucose, capillary     Status: Abnormal   Collection Time: 03/23/18  4:49 PM  Result Value Ref Range   Glucose-Capillary 143 (H) 65 - 99 mg/dL  Glucose, capillary     Status: Abnormal   Collection Time: 03/23/18  9:53 PM  Result Value Ref Range   Glucose-Capillary 151 (H) 65 - 99 mg/dL  Glucose, capillary     Status: Abnormal   Collection Time: 03/24/18  6:18 AM  Result Value Ref Range   Glucose-Capillary 124 (H) 65 - 99 mg/dL  Glucose, capillary     Status: Abnormal   Collection Time: 03/24/18 11:51 AM  Result Value Ref Range   Glucose-Capillary 149 (H) 65 - 99 mg/dL  Glucose, capillary     Status: Abnormal   Collection Time: 03/24/18  4:52 PM  Result Value Ref Range   Glucose-Capillary 104 (H) 65 - 99 mg/dL  Glucose, capillary     Status: Abnormal   Collection Time: 03/24/18  9:35 PM  Result Value Ref Range   Glucose-Capillary 139 (H) 65 - 99 mg/dL  CBC     Status: None   Collection Time: 03/25/18  5:30 AM  Result Value Ref Range   WBC 5.2 4.0 - 10.5 K/uL   RBC 4.45 4.22 - 5.81 MIL/uL   Hemoglobin 13.8 13.0 - 17.0 g/dL   HCT 42.6 39.0 - 52.0 %   MCV 95.7 78.0 - 100.0 fL   MCH 31.0 26.0 - 34.0 pg   MCHC 32.4 30.0 - 36.0 g/dL   RDW 13.1 11.5 - 15.5 %   Platelets 173 150 - 400 K/uL    Comment: Performed at [facility], [place]. [address], [place], [place] [address]  Basic metabolic  panel     Status: Abnormal   Collection Time: 03/25/18  5:30 AM  Result Value Ref Range   Sodium 138 135 - 145 mmol/L   Potassium 3.9 3.5 - 5.1 mmol/L   Chloride 106 101 - 111 mmol/L   CO2 26 22 - 32 mmol/L   Glucose, Bld 130 (H) 65 - 99 mg/dL   BUN 29 (H) 6 - 20 mg/dL   Creatinine, Ser 0.91 0.61 - 1.24 mg/dL   Calcium 8.6 (L) 8.9 - 10.3 mg/dL   GFR calc non Af Amer >60 >60 mL/min   GFR calc Af Amer >60 >60 mL/min    Comment: (NOTE) The eGFR has been calculated using the CKD EPI equation. This calculation has not been validated in all clinical situations. eGFR's persistently <60 mL/min signify possible Chronic Kidney Disease.    Anion gap 6 5 - 15    Comment: Performed at [facility] [address] [place], [place] [address]  Glucose, capillary     Status: Abnormal   Collection Time: 03/25/18  6:36 AM  Result Value Ref Range   Glucose-Capillary 134 (H) 65 -  99 mg/dL  Glucose, capillary     Status: Abnormal   Collection Time: 03/25/18 11:43 AM  Result Value Ref Range   Glucose-Capillary 116 (H) 65 - 99 mg/dL  Glucose, capillary     Status: Abnormal   Collection Time: 03/25/18  4:35 PM  Result Value Ref Range   Glucose-Capillary 145 (H) 65 - 99 mg/dL  Glucose, capillary     Status: Abnormal   Collection Time: 03/25/18  8:57 PM  Result Value Ref Range   Glucose-Capillary 195 (H) 65 - 99 mg/dL  Glucose, capillary     Status: Abnormal   Collection Time: 03/26/18  6:18 AM  Result Value Ref Range   Glucose-Capillary 134 (H) 65 - 99 mg/dL     Constitutional: No distress . Vital signs reviewed. HEENT: EOMI, oral membranes moist Neck: supple Cardiovascular: RRR without murmur. No JVD    Respiratory: CTA Bilaterally without wheezes or rales. Normal effort    GI: BS +, non-tender, non-distended  Musc: No edema or tenderness in extremities. Neuro: Alert.  Word finding deficits Motor: Limited due to participation and apraxia ?RUE: 0/5 proximal distal ?RLE: Hip flexion,  knee extension, ADF 4/5 ?LLE: 4+/5 proximal to distal  .   Assessment/Plan: 1. Functional deficits secondary to left PCA infarct with right hemiparesis, receptive aphasia which require 3+ hours per day of interdisciplinary therapy in a comprehensive inpatient rehab setting. Physiatrist is providing close team supervision and 24 hour management of active medical problems listed below. Physiatrist and rehab team continue to assess barriers to discharge/monitor patient progress toward functional and medical goals. FIM: Function - Bathing Bathing activity did not occur: N/A Position: Shower Body parts bathed by patient: Chest, Abdomen, Front perineal area, Right arm, Left arm, Right upper leg, Left upper leg, Right lower leg, Left lower leg, Buttocks Body parts bathed by helper: Back Assist Level: Touching or steadying assistance(Pt > 75%)  Function- Upper Body Dressing/Undressing Upper body dressing/undressing activity did not occur: N/A What is the patient wearing?: Pull over shirt/dress Pull over shirt/dress - Perfomed by patient: Thread/unthread right sleeve, Thread/unthread left sleeve, Put head through opening, Pull shirt over trunk Pull over shirt/dress - Perfomed by helper: Thread/unthread right sleeve Assist Level: Touching or steadying assistance(Pt > 75%) Function - Lower Body Dressing/Undressing Lower body dressing/undressing activity did not occur: N/A What is the patient wearing?: Pants, Socks, Shoes Position: Wheelchair/chair at sink Underwear - Performed by helper: Thread/unthread left underwear leg, Pull underwear up/down Pants- Performed by patient: Pull pants up/down, Thread/unthread left pants leg, Thread/unthread right pants leg Pants- Performed by helper: Thread/unthread right pants leg Non-skid slipper socks- Performed by helper: Don/doff right sock, Don/doff left sock Socks - Performed by patient: Don/doff left sock Socks - Performed by helper: Don/doff right  sock Shoes - Performed by patient: Don/doff left shoe, Don/doff right shoe Shoes - Performed by helper: Fasten left, Fasten right Assist for footwear: Partial/moderate assist Assist for lower body dressing: Touching or steadying assistance (Pt > 75%)  Function - Toileting Toileting activity did not occur: N/A Toileting steps completed by helper: Adjust clothing prior to toileting, Performs perineal hygiene, Adjust clothing after toileting Toileting Assistive Devices: Grab bar or rail Assist level: Touching or steadying assistance (Pt.75%)  Function - Air cabin crew transfer activity did not occur: N/A(this did not occur with me today) Toilet transfer assistive device: Mechanical lift Mechanical lift: Stedy Assist level to toilet: 2 helpers Assist level from toilet: 2 helpers  Function - Chair/bed transfer Chair/bed transfer activity  did not occur: N/A(this did not occur with me today) Chair/bed transfer method: Stand pivot Chair/bed transfer assist level: Moderate assist (Pt 50 - 74%/lift or lower) Chair/bed transfer assistive device: Armrests, Walker Mechanical lift: Stedy Chair/bed transfer details: Tactile cues for placement, Tactile cues for weight shifting, Manual facilitation for weight shifting, Manual facilitation for placement, Manual facilitation for weight bearing  Function - Locomotion: Wheelchair Will patient use wheelchair at discharge?: Yes Type: Manual Wheelchair activity did not occur: Safety/medical concerns Max wheelchair distance: 70 Assist Level: Touching or steadying assistance (Pt > 75%) Wheel 50 feet with 2 turns activity did not occur: Safety/medical concerns Assist Level: Touching or steadying assistance (Pt > 75%) Wheel 150 feet activity did not occur: Safety/medical concerns Assist Level: Touching or steadying assistance (Pt > 75%) Turns around,maneuvers to table,bed, and toilet,negotiates 3% grade,maneuvers on rugs and over doorsills:  No Function - Locomotion: Ambulation Ambulation activity did not occur: N/A Assistive device: Walker-rolling, Orthosis Max distance: 60' Assist level: Moderate assist (Pt 50 - 74%) Walk 10 feet activity did not occur: Safety/medical concerns Assist level: Moderate assist (Pt 50 - 74%) Walk 50 feet with 2 turns activity did not occur: Safety/medical concerns Assist level: Moderate assist (Pt 50 - 74%) Walk 150 feet activity did not occur: Safety/medical concerns Walk 10 feet on uneven surfaces activity did not occur: Safety/medical concerns  Function - Comprehension Comprehension: Auditory Comprehension assistive device: Hearing aids Comprehension assist level: Understands basic 75 - 89% of the time/ requires cueing 10 - 24% of the time  Function - Expression Expression: Verbal Expression assist level: Expresses basic 75 - 89% of the time/requires cueing 10 - 24% of the time. Needs helper to occlude trach/needs to repeat words.  Function - Social Interaction Social Interaction assist level: Interacts appropriately 75 - 89% of the time - Needs redirection for appropriate language or to initiate interaction.  Function - Problem Solving Problem solving assist level: Solves basic 50 - 74% of the time/requires cueing 25 - 49% of the time  Function - Memory Memory assist level: Recognizes or recalls 25 - 49% of the time/requires cueing 50 - 75% of the time Patient normally able to recall (first 3 days only): That he or she is in a hospital   Medical Problem List and Plan: 1.  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  Cont CIR-PT, OT, SLP-team conf in am2.  DVT Prophylaxis/Anticoagulation:  Eliquis  will serve as DVT prophylaxis as well 3. Pain Management: tylenol prn.  4. Mood: Team to provide ego support to help with frustration.  LCSW to follow for evaluation and support when appropriate.  On Prozac-neuropsych f/u 5. Neuropsych: This patient is  not capable of making decisions on his own behalf. 6. Skin/Wound Care: routine pressure relief measures.  7. Fluids/Electrolytes/Nutrition: Monitor I/Os. Added full supervision at meals for safety., BMET normal 6/17 except elevated BUN- fluid intake 814m cont to encourage po fluids 8. PAF/CAD/HTN: Monitor HR bid. ON metoprolol and Lipitor.  Vitals:   03/25/18 1959 03/26/18 0414  BP: 128/64 114/61  Pulse: 63 (!) 52  Resp: 17 16  Temp: 97.8 F (36.6 C) 98.7 F (37.1 C)  SpO2: 95% 91%   low dose amlodipine started 5/28 Good  control 6/18, asymptomatic brady due to BB 9. T2DM: Hgb A1C- 7.1.  Monitor BS ac/hs and use SSI for elevated BS. Metformin d/ced due to GI Side effects CBG (last 3)  Recent Labs    03/25/18 1635 03/25/18  2057 03/26/18 0618  GLUCAP 145* 195* 134*   Good  control 6/18 continue SSI 10. Myasthenia Gravis with intermittent diplopia: On mestonin 30 mg bid--to be increased to 60 mg bid per discharge summary, daughter doesn't want him to have this, had EMG confirmation. Monitor for fatiguability, ocular symptoms--- but appears to be tolerating therapy thus far,   -Consider revisiting treatments with daughter if further symptoms arise as pt cannot make a decision, pt also cannot provide subjective symptoms 11. Glaucoma: Continue Ocuvite bid and  Xalatan at bedtime.  12.  Hypoalb- prostat 13.  Insomnia- improved , Per sleep graph 7 hours LOS (Days) 27 A FACE TO FACE EVALUATION WAS PERFORMED  Charlett Blake 03/26/2018, 8:30 AM

## 2018-03-27 ENCOUNTER — Inpatient Hospital Stay (HOSPITAL_COMMUNITY): Payer: PPO | Admitting: Occupational Therapy

## 2018-03-27 ENCOUNTER — Telehealth: Payer: Self-pay

## 2018-03-27 ENCOUNTER — Inpatient Hospital Stay (HOSPITAL_COMMUNITY): Payer: PPO

## 2018-03-27 ENCOUNTER — Inpatient Hospital Stay (HOSPITAL_COMMUNITY): Payer: PPO | Admitting: Speech Pathology

## 2018-03-27 LAB — GLUCOSE, CAPILLARY
GLUCOSE-CAPILLARY: 137 mg/dL — AB (ref 65–99)
GLUCOSE-CAPILLARY: 114 mg/dL — AB (ref 65–99)
GLUCOSE-CAPILLARY: 159 mg/dL — AB (ref 65–99)
Glucose-Capillary: 122 mg/dL — ABNORMAL HIGH (ref 65–99)

## 2018-03-27 LAB — URINALYSIS, COMPLETE (UACMP) WITH MICROSCOPIC
Bacteria, UA: NONE SEEN
Bilirubin Urine: NEGATIVE
Glucose, UA: NEGATIVE mg/dL
Hgb urine dipstick: NEGATIVE
Ketones, ur: NEGATIVE mg/dL
Nitrite: NEGATIVE
PH: 7 (ref 5.0–8.0)
Protein, ur: NEGATIVE mg/dL
SPECIFIC GRAVITY, URINE: 1.011 (ref 1.005–1.030)

## 2018-03-27 MED ORDER — LORAZEPAM 2 MG/ML IJ SOLN
INTRAMUSCULAR | Status: AC
  Administered 2018-03-27: 1 mg via INTRAMUSCULAR
  Filled 2018-03-27: qty 1

## 2018-03-27 MED ORDER — LORAZEPAM 2 MG/ML IJ SOLN
1.0000 mg | Freq: Once | INTRAMUSCULAR | Status: AC
  Administered 2018-03-27: 1 mg via INTRAMUSCULAR

## 2018-03-27 NOTE — Progress Notes (Deleted)
Occupational Therapy Discharge Summary  Patient Details  Name: Endre Coutts MRN: [id number] Date of Birth: July 16, 1928  Patient has met 46 of 12 long term goals due to improved activity tolerance, improved balance, postural control, ability to compensate for deficits, functional use of  RIGHT upper and RIGHT lower extremity, improved attention, improved awareness and improved coordination.  Patient to discharge at [facility] Assist level.  Patient's care partner is independent to provide the necessary physical and cognitive assistance at discharge.    Reasons goals not met: Pt needs occasional mod A for shower transfers.  Recommendation:  Patient will benefit from ongoing skilled OT services in home health setting to continue to advance functional skills in the area of BADL and Reduce care partner burden.  Equipment: RW, tub transfer bench, 18x18 wc, 3-in-1 BSC  Reasons for discharge: treatment goals met and discharge from hospital  Patient/family agrees with progress made and goals achieved: Yes  OT Discharge Precautions/Restrictions  Precautions Precautions: Fall Precaution Comments: R hemiparesis, ataxia Restrictions Weight Bearing Restrictions: No Pain  none/denies pain ADL ADL ADL Comments: Please see functional navigator Vision Alignment/Gaze Preference: Gaze left Perception  Perception: Impaired Inattention/Neglect: Does not attend to right side of body;Does not attend to right visual field Praxis Praxis: Impaired Praxis Impairment Details: Ideation;Initiation;Motor planning;Perseveration Cognition Overall Cognitive Status: Impaired/Different from baseline Arousal/Alertness: Awake/alert Orientation Level: Oriented to person Attention: Focused;Sustained Focused Attention: Appears intact Sustained Attention: Impaired Sustained Attention Impairment: Verbal basic;Functional basic Memory: Impaired Memory Impairment: Storage deficit;Decreased long term  memory;Retrieval deficit;Decreased short term memory Decreased Long Term Memory: Verbal basic Decreased Short Term Memory: Verbal basic Awareness: Impaired Awareness Impairment: Emergent impairment Problem Solving: Impaired Problem Solving Impairment: Verbal basic;Functional basic Behaviors: Perseveration Safety/Judgment: Impaired Sensation Sensation Light Touch: Impaired by gross assessment Light Touch Impaired Details: Impaired RUE;Impaired RLE Proprioception: Impaired by gross assessment Proprioception Impaired Details: Impaired RUE;Impaired RLE Coordination Gross Motor Movements are Fluid and Coordinated: No Fine Motor Movements are Fluid and Coordinated: No Coordination and Movement Description: Ataxic and apraxic R UE/LE  Finger Nose Finger Test: dysmetria, difficulty nuderstanding concept  Motor  Motor Motor: Hemiplegia;Abnormal tone;Ataxia;Motor apraxia Motor - Skilled Clinical Observations: R hemi Mobility  Bed Mobility Bed Mobility: Rolling Right;Rolling Left;Supine to Sit;Sit to Supine Rolling Right: Supervision/verbal cueing Rolling Left: Supervision/Verbal cueing Supine to Sit: Supervision/Verbal cueing Sit to Supine: Supervision/Verbal cueing Transfers Sit to Stand: Minimal Assistance - Patient > 75% Sit to Stand Details: Verbal cues for technique;Verbal cues for precautions/safety Stand to Sit: Minimal Assistance - Patient > 75% Stand to Sit Details (indicate cue type and reason): Verbal cues for technique;Verbal cues for precautions/safety  Trunk/Postural Assessment  Cervical Assessment Cervical Assessment: Within Functional Limits Thoracic Assessment Thoracic Assessment: Within Functional Limits Lumbar Assessment Lumbar Assessment: Within Functional Limits Postural Control Postural Control: Deficits on evaluation Trunk Control: impaired in standing Protective Responses: impaired  Balance Balance Balance Assessed: Yes Static Sitting Balance Static  Sitting - Level of Assistance: 5: Stand by assistance Dynamic Sitting Balance Dynamic Sitting - Balance Support: No upper extremity supported;Feet supported;During functional activity Dynamic Sitting - Level of Assistance: 5: Stand by assistance Static Standing Balance Static Standing - Level of Assistance: 4: Min assist Dynamic Standing Balance Dynamic Standing - Level of Assistance: 4: Min assist Extremity/Trunk Assessment RUE Assessment RUE Assessment: Exceptions to [facility] RUE Body System: Neuro Brunstrum levels for arm and hand: Arm;Hand Brunstrum level for arm: Stage IV Movement is deviating from synergy Brunstrum level for hand: Stage IV Movements deviating from synergies RUE  Strength RUE Overall Strength: Deficits RUE Tone RUE Tone: Modified Ashworth;Moderate Modified Ashworth Scale for Grading Hypertonia RUE: Slight increase in muscle tone, manifested by a catch, followed by minimal resistance throughout the remainder (less than half) of the ROM RUE Tone Comments: Apraxia, ataxia, and perception deficits  LUE Assessment LUE Assessment: Within Functional Limits   See Function Navigator for Current Functional Status.  Daneen Schick Colisha Redler 03/27/2018, 4:18 PM

## 2018-03-27 NOTE — Progress Notes (Signed)
Occupational Therapy Session Note  Patient Details  Name: Kyle Richardson MRN: [id number] Date of Birth: Jul 16, 1928  Today's Date: 03/27/2018  Session 1 OT Individual Time: 6859-9234 OT Individual Time Calculation (min): 56 min   Session 2 OT Individual Time: 1135-1200 OT Individual Time Calculation (min): 25 min   Short Term Goals: Week 4:  OT Short Term Goal 1 (Week 4): STG=LTG 2/2 ELOS  Skilled Therapeutic Interventions/Progress Updates:  Session 1 Pt greeted semi-reclined in bed and agreeable to OT. Pt noted to have been incontinent of urine in brief. Stand-pivot transfer without AD to the L with Min A. Stand-pivot into shower with Mod A to the R side with use of grab bars and multimodal cues for R LE positioning. Wash mit donned to R UE and pt able to integrate R UE into bathing tasks with min cues for neuro re-ed. Pt more perseverative today with BADLs, needing mod cues to go to the next task. Pt demonstrated improved awareness  R side today and especially more aware of use of R UE. Pt able to locate grooming items at the sink on R side with min questioning cues. He also was able to maintain grasp on deodorant today and place deodorant without assistance, even trading hands to then put deodorant under R UE. Pt left seated in regular wc at end of session with safety belt on and needs met.   Session 2 Pt greeted sitting in wc and agreeable to OT. Pt states that he had an accident-incontinent of urine. Pt brought to the sink in wc and worked on sit><stands with min/supervision and multiple cues for hand placement with sit<>stand. Pt able to wash peri-area with set-up A and CGA-min A for balance. Worked on R attention and object identification using everyday grooming objects with pt able to identify objects and their use with 25% accuracy. Pt left seated in wc with safety belt on and needs met.   Therapy Documentation Precautions:  Precautions Precautions: Fall Precaution Comments: R  hemiparesis, ataxia Restrictions Weight Bearing Restrictions: No Pain:  none/denies pain ADL: ADL ADL Comments: Please see functional navigator   See Function Navigator for Current Functional Status.   Therapy/Group: Individual Therapy  Valma Cava 03/27/2018, 3:41 PM

## 2018-03-27 NOTE — Telephone Encounter (Signed)
Copied from [place] [id number]. Topic: Appointment Scheduling - Prior Auth Required for Appointment >> Mar 27, 2018  3:37 PM Ivar Drape wrote: No appointment has been scheduled. Patient is requesting a hospital follow up appointment. Per scheduling protocol, this appointment requires a prior authorization prior to scheduling. Patient needs a [facility] f/u appt for a Stroke and In Patient Rehab. Patient needs to see Dr. Nicki Reaper within two weeks.  Please advise.    Route to department's PEC pool.

## 2018-03-27 NOTE — Progress Notes (Signed)
Speech Language Pathology Daily Session Note  Patient Details  Name: Kyle Richardson MRN: [id number] Date of Birth: November 16, 1927  Today's Date: 03/27/2018 SLP Individual Time: 6629-4765 SLP Individual Time Calculation (min): 45 min  Short Term Goals: Week 4: SLP Short Term Goal 1 (Week 4): Patient will attend to right field of enviornment during functional tasks with supervision A multimodal cues.  SLP Short Term Goal 2 (Week 4): Patient will demonstrate selective attention to tasks for ~30 minutes with Min A verbal cues for redirection in a mildly distracting enviornment.  SLP Short Term Goal 3 (Week 4): Patient will demonstrate functional problem solving for basic and familiar tasks with Min A verbal cues.  SLP Short Term Goal 4 (Week 4): Patient will self-monitor and correct verbal errors at the phrase level during strutured tasks with Min A verbal cues.  SLP Short Term Goal 5 (Week 4): Patient will follow 2-step commands with Mod A verbal cues.   Skilled Therapeutic Interventions: Skilled treatment session focused on cognitive-linguistic goals. SLP facilitated session by providing Max-Total A for patient to recognize and/or decode a familiar word when matching the written word to an object from a field of 2. Patient continues to require total A to identify/recognitize letters and single word, therefore, suspect patient has alexia. Patient named functional items with Min A verbal cues. Patient left upright in wheelchair with all needs within reach and quick release belt in place. Continue with current plan of care.      Function:  Eating Eating   Modified Consistency Diet: No Eating Assist Level: Set up assist for;More than reasonable amount of time;Supervision or verbal cues   Eating Set Up Assist For: Opening containers;Cutting food       Cognition Comprehension Comprehension assist level: Understands basic 75 - 89% of the time/ requires cueing 10 - 24% of the time  Expression    Expression assist level: Expresses basic 75 - 89% of the time/requires cueing 10 - 24% of the time. Needs helper to occlude trach/needs to repeat words.  Social Interaction Social Interaction assist level: Interacts appropriately 75 - 89% of the time - Needs redirection for appropriate language or to initiate interaction.  Problem Solving Problem solving assist level: Solves basic 50 - 74% of the time/requires cueing 25 - 49% of the time  Memory Memory assist level: Recognizes or recalls 50 - 74% of the time/requires cueing 25 - 49% of the time    Pain No/Denies Pain   Therapy/Group: Individual Therapy  Ica Daye 03/27/2018, 4:03 PM

## 2018-03-27 NOTE — Plan of Care (Signed)
  Problem: Consults Goal: RH STROKE PATIENT EDUCATION Description See Patient Education module for education specifics  Outcome: Progressing   Problem: RH BOWEL ELIMINATION Goal: RH STG MANAGE BOWEL WITH ASSISTANCE Description STG Manage Bowel with mod Assistance.  Outcome: Progressing Goal: RH STG MANAGE BOWEL W/MEDICATION W/ASSISTANCE Description STG Manage Bowel with Medication with mod Assistance.  Outcome: Progressing   Problem: RH BLADDER ELIMINATION Goal: RH STG MANAGE BLADDER WITH ASSISTANCE Description STG Manage Bladder With mod Assistance  Outcome: Progressing   Problem: RH SKIN INTEGRITY Goal: RH STG SKIN FREE OF INFECTION/BREAKDOWN Description Pt will remain free from skin infection/ breakdown with mod assistance.  Outcome: Progressing Goal: RH STG MAINTAIN SKIN INTEGRITY WITH ASSISTANCE Description STG Maintain Skin Integrity With mod Assistance.  Outcome: Progressing   Problem: RH SAFETY Goal: RH STG ADHERE TO SAFETY PRECAUTIONS W/ASSISTANCE/DEVICE Description STG Adhere to Safety Precautions With mod  Assistance/Device.  Outcome: Progressing   Problem: RH COGNITION-NURSING Goal: RH STG USES MEMORY AIDS/STRATEGIES W/ASSIST TO PROBLEM SOLVE Description STG Uses Memory Aids/Strategies With mod Assistance to Problem Solve.  Outcome: Progressing Goal: RH STG ANTICIPATES NEEDS/CALLS FOR ASSIST W/ASSIST/CUES Description STG Anticipates Needs/Calls for Assist With mod Assistance/Cues.  Outcome: Progressing   Problem: RH KNOWLEDGE DEFICIT Goal: RH STG INCREASE KNOWLEDGE OF DIABETES Description Mod assist  Outcome: Progressing Goal: RH STG INCREASE KNOWLEDGE OF HYPERTENSION Description Mod assist  Outcome: Progressing

## 2018-03-27 NOTE — Progress Notes (Signed)
Physical Therapy Discharge Summary  Patient Details  Name: Kyle Richardson MRN: [id number] Date of Birth: 1928/06/06    Patient has met 7 of 8 long term goals due to improved activity tolerance, improved balance, improved postural control, increased strength, functional use of  right upper extremity and right lower extremity, improved attention and improved awareness.  Patient to discharge at a wheelchair level [place].   Patient's care partner is independent to provide the necessary physical assistance at discharge. Pt will be discharging at a min assist level for stand pivot transfers, family has been educated on proper techniques and cueing to provide, family has also hired caregivers to assist with pts 24/7 care.   Reasons goals not met: Pt did not meet ambulation goal, therapy not recommending ambulation at home. Pt will discharge at a wheel chair level with stand pivot transfers only.   Recommendation:  Patient will benefit from ongoing skilled PT services in home health setting to continue to advance safe functional mobility, address ongoing impairments in balance, awareness, transfers, gait, and minimize fall risk.  Equipment: w/c and RW  Reasons for discharge: treatment goals met  Patient/family agrees with progress made and goals achieved: Yes  PT Discharge Precautions/Restrictions Precautions Precautions: Fall Precaution Comments: R hemiparesis, ataxia Restrictions Weight Bearing Restrictions: No Vital Signs Therapy Vitals Temp: 97.9 F (36.6 C) Temp Source: Oral Pulse Rate: 65 Resp: 17 BP: (!) 117/56 Patient Position (if appropriate): Sitting Oxygen Therapy SpO2: 98 % O2 Device: Room Air Cognition Overall Cognitive Status: Impaired/Different from baseline Arousal/Alertness: Awake/alert Orientation Level: Oriented to person Attention: Focused;Sustained Focused Attention: Appears intact Sustained Attention: Impaired Memory: Impaired Awareness:  Impaired Problem Solving: Impaired Safety/Judgment: Impaired Sensation Sensation Light Touch: Impaired by gross assessment Light Touch Impaired Details: Impaired RUE;Impaired RLE Proprioception: Impaired by gross assessment Proprioception Impaired Details: Impaired RUE;Impaired RLE Coordination Gross Motor Movements are Fluid and Coordinated: No Fine Motor Movements are Fluid and Coordinated: No Coordination and Movement Description: Ataxic and apraxic with extremity all movements  Motor  Motor Motor: Hemiplegia;Abnormal tone;Ataxia;Motor apraxia Motor - Skilled Clinical Observations: R hemi, increased R hamstring tone, posterior and R lateral lean bias in standing  Mobility Bed Mobility Bed Mobility: Rolling Right;Rolling Left;Supine to Sit;Sit to Supine Rolling Right: Supervision/verbal cueing Rolling Left: Supervision/Verbal cueing Supine to Sit: Supervision/Verbal cueing Sit to Supine: Supervision/Verbal cueing Transfers Transfers: Sit to Stand;Stand Pivot Transfers Sit to Stand: Minimal Assistance - Patient > 75% Sit to Stand Details: Verbal cues for technique;Verbal cues for precautions/safety Stand to Sit: Minimal Assistance - Patient > 75% Stand to Sit Details (indicate cue type and reason): Verbal cues for technique;Verbal cues for precautions/safety Stand Pivot Transfers: Moderate Assistance - Patient 50 - 74%;Minimal Assistance - Patient > 75% Stand Pivot Transfer Details: Tactile cues for placement;Verbal cues for sequencing;Verbal cues for precautions/safety;Verbal cues for technique Transfer (Assistive device): Rolling walker Locomotion  Gait Ambulation: Yes Gait Assistance: Moderate Assistance - Patient 50-74% Gait Gait: Yes Gait Pattern: Scissoring;Ataxic Wheelchair Mobility Wheelchair Mobility: Yes Wheelchair Assistance: Supervision/Verbal cueing  Trunk/Postural Assessment  Cervical Assessment Cervical Assessment: Within Functional Limits Thoracic  Assessment Thoracic Assessment: Within Functional Limits Lumbar Assessment Lumbar Assessment: Within Functional Limits Postural Control Postural Control: Deficits on evaluation Trunk Control: impaired in standing Protective Responses: impaired  Balance Balance Balance Assessed: Yes Static Sitting Balance Static Sitting - Level of Assistance: 5: Stand by assistance Dynamic Sitting Balance Dynamic Sitting - Level of Assistance: 5: Stand by assistance Static Standing Balance Static Standing - Level of Assistance: 4:  Min assist Dynamic Standing Balance Dynamic Standing - Level of Assistance: 3: Mod assist;4: Min assist Extremity Assessment  RLE Assessment RLE Assessment: Exceptions to [facility] General Strength Comments: strength grossly 4/5 seen functionally through gross motor tasks however motor apraxia and ataxia limiting formal assessment RLE Tone RLE Tone: Moderate LLE Assessment LLE Assessment: Within Functional Limits   See Function Navigator for Current Functional Status.  Netta Corrigan, PT, DPT 03/27/2018, 3:51 PM

## 2018-03-27 NOTE — Progress Notes (Signed)
Physical Therapy Session Note  Patient Details  Name: Kyle Richardson MRN: [id number] Date of Birth: 06-18-1928  Today's Date: 03/27/2018 PT Individual Time: 9449-6759 PT Individual Time Calculation (min): 74 min   Short Term Goals: Week 4:  PT Short Term Goal 1 (Week 4): =LTGs due to ELOS  Skilled Therapeutic Interventions/Progress Updates:    Pt seated in w/c upon PT arrival, agreeable to therapy tx and denies pain. Pt reports having to use the bathroom. Pt performed stand pivot transfer from w/c>toilet with mod assist, pt able to maintain standing balance with min assist using L grab bar while therapist performs clothing management total assist. Pt continent of bowel. Pt worked on standing balance with min assist while using L hand to clean. Pt performed stand pivot transfer back to w/c with mod assist. Pt seated in w/c at the sink washed hands, hand over hand assist to use and wash R hand. Pt transported to the gym. Pt performed stand pivot transfer from w/c>mat with RW and min assist, verbal cues for sequencing. Pt performed multiple sit<>stands from the mat with and without RW this session, min assist to ensure proper positioning of R foot. In standing pt worked on Dietitian R attention/visual scanning in order to stack cups on R side, x 2 trials. Pt worked on R UE NMR in order to grasp cups and to grasp horse shoes and then place in a pile, verbal cues and hand over hand assist. Pt ambulated x 30 ft this session with RW and mod assist, verbal cues for R foot placement and sequencing. Pt transported to dayroom. Pt performed stand pivot transfer from w/c>nustep to the L with min assist, verbal cues for sequencing. Pt used nustep for neuro re-ed and reciprocal movement of UEs/LEs x 6 minutes on workload 4, pt requiring occasional verbal cues to correct R hand/foot positioning. Pt transferred back to w/c towards the R with mod assist, transported back to room and left seated with needs in reach,  QRB in place and chair alarm set.   Therapy Documentation Precautions:  Precautions Precautions: Fall Precaution Comments: R hemiplegia, increased R hamstring tone Restrictions Weight Bearing Restrictions: No   See Function Navigator for Current Functional Status.   Therapy/Group: Individual Therapy  Netta Corrigan, PT, DPT 03/27/2018, 7:49 AM

## 2018-03-28 ENCOUNTER — Inpatient Hospital Stay (HOSPITAL_COMMUNITY): Payer: PPO

## 2018-03-28 ENCOUNTER — Inpatient Hospital Stay (HOSPITAL_COMMUNITY): Payer: PPO | Admitting: Speech Pathology

## 2018-03-28 ENCOUNTER — Ambulatory Visit (HOSPITAL_COMMUNITY): Payer: PPO | Admitting: Physical Therapy

## 2018-03-28 DIAGNOSIS — R410 Disorientation, unspecified: Secondary | ICD-10-CM

## 2018-03-28 LAB — BASIC METABOLIC PANEL
ANION GAP: 7 (ref 5–15)
BUN: 13 mg/dL (ref 6–20)
CALCIUM: 9.1 mg/dL (ref 8.9–10.3)
CO2: 27 mmol/L (ref 22–32)
CREATININE: 0.89 mg/dL (ref 0.61–1.24)
Chloride: 107 mmol/L (ref 101–111)
GFR calc non Af Amer: >60 mL/min (ref >60)
Glucose, Bld: 132 mg/dL — ABNORMAL HIGH (ref 65–99)
Potassium: 4.1 mmol/L (ref 3.5–5.1)
SODIUM: 141 mmol/L (ref 135–145)

## 2018-03-28 LAB — CBC WITH DIFFERENTIAL/PLATELET
Abs Immature Granulocytes: 0 10*3/uL (ref 0.0–0.1)
BASOS ABS: 0 10*3/uL (ref 0.0–0.1)
Basophils Relative: 1 %
EOS PCT: 5 %
Eosinophils Absolute: 0.3 10*3/uL (ref 0.0–0.7)
HEMATOCRIT: 45.4 % (ref 39.0–52.0)
HEMOGLOBIN: 14.8 g/dL (ref 13.0–17.0)
IMMATURE GRANULOCYTES: 1 %
LYMPHS ABS: 1.8 10*3/uL (ref 0.7–4.0)
Lymphocytes Relative: 29 %
MCH: 31.6 pg (ref 26.0–34.0)
MCHC: 32.6 g/dL (ref 30.0–36.0)
MCV: 96.8 fL (ref 78.0–100.0)
Monocytes Absolute: 0.7 10*3/uL (ref 0.1–1.0)
Monocytes Relative: 12 %
NEUTROS PCT: 54 %
Neutro Abs: 3.4 10*3/uL (ref 1.7–7.7)
Platelets: 186 10*3/uL (ref 150–400)
RBC: 4.69 MIL/uL (ref 4.22–5.81)
RDW: 13.2 % (ref 11.5–15.5)
WBC: 6.2 10*3/uL (ref 4.0–10.5)

## 2018-03-28 LAB — GLUCOSE, CAPILLARY
GLUCOSE-CAPILLARY: 128 mg/dL — AB (ref 65–99)
GLUCOSE-CAPILLARY: 142 mg/dL — AB (ref 65–99)
Glucose-Capillary: 168 mg/dL — ABNORMAL HIGH (ref 65–99)
Glucose-Capillary: 137 mg/dL — ABNORMAL HIGH (ref 65–99)

## 2018-03-28 MED ORDER — LORAZEPAM 2 MG/ML IJ SOLN: 0.5000 mg | Freq: Once | INTRAMUSCULAR | Status: AC

## 2018-03-28 MED ORDER — LORAZEPAM 2 MG/ML IJ SOLN
INTRAMUSCULAR | Status: AC
  Administered 2018-03-28: 0.5 mg
  Filled 2018-03-28: qty 1

## 2018-03-28 MED ORDER — SODIUM CHLORIDE 0.9 % IV SOLN
75.0000 mL/h | INTRAVENOUS | Status: DC
  Administered 2018-03-28 – 2018-03-30 (×5): 75 mL/h via INTRAVENOUS

## 2018-03-28 NOTE — Progress Notes (Signed)
Speech Language Pathology Daily Session Note  Patient Details  Name: Kyle Richardson MRN: [id number] Date of Birth: January 19, 1928  Today's Date: 03/28/2018 SLP Individual Time: 1015-1045 SLP Individual Time Calculation (min): 30 min  Short Term Goals: Week 4: SLP Short Term Goal 1 (Week 4): Patient will attend to right field of enviornment during functional tasks with supervision A multimodal cues.  SLP Short Term Goal 2 (Week 4): Patient will demonstrate selective attention to tasks for ~30 minutes with Min A verbal cues for redirection in a mildly distracting enviornment.  SLP Short Term Goal 3 (Week 4): Patient will demonstrate functional problem solving for basic and familiar tasks with Min A verbal cues.  SLP Short Term Goal 4 (Week 4): Patient will self-monitor and correct verbal errors at the phrase level during strutured tasks with Min A verbal cues.  SLP Short Term Goal 5 (Week 4): Patient will follow 2-step commands with Mod A verbal cues.   Skilled Therapeutic Interventions: Skilled treatment session focused on cognitive-linguistic goals. Upon arrival, patient was upright in the bed without clothes and appeared confused. Patient reported [place] was in [place] and required total A for orientation. Patient observed reaching for items from the air that weren't present and reported he was moving his "phonebook." Patient demonstrated decreased word-finding this session and required Mod A multimodal cues to name functional items with verbal perseveration on "spitbush." As SLP was leaving the room, patient asked where she was going because he wanted to go play basketball. Overall, patient's cognitive-linguistic function appears to have functionally declined. RN, PA and CSW made aware since patient had a planned d/c today. Patient left upright in bed with alarm on and all needs within reach.      Function:   Cognition Comprehension Comprehension assist level: Understands basic 50  - 74% of the time/ requires cueing 25 - 49% of the time  Expression   Expression assist level: Expresses basic 50 - 74% of the time/requires cueing 25 - 49% of the time. Needs to repeat parts of sentences.  Social Interaction Social Interaction assist level: Interacts appropriately 50 - 74% of the time - May be physically or verbally inappropriate.  Problem Solving Problem solving assist level: Solves basic 25 - 49% of the time - needs direction more than half the time to initiate, plan or complete simple activities  Memory Memory assist level: Recognizes or recalls less than 25% of the time/requires cueing greater than 75% of the time    Pain Pain Assessment Pain Scale: 0-10 Pain Score: 0-No pain  Therapy/Group: Individual Therapy  Almina Schul 03/28/2018, 12:43 PM

## 2018-03-28 NOTE — Telephone Encounter (Signed)
This is the msg I spoke with you about yesterday afternoon

## 2018-03-28 NOTE — Discharge Summary (Signed)
Physician Discharge Summary  Patient ID: Kyle Richardson MRN: [id number] DOB/AGE: [id number] 82 y.o.  Admit date: 02/27/2018 Discharge date: 04/01/2018  Discharge Diagnoses:  Principal Problem:   Acute ischemic left PCA stroke (HCC) Active Problems:   Hypertension   PAF (paroxysmal atrial fibrillation) (HCC)   Double vision   Right homonymous hemianopsia   MG, ocular (myasthenia gravis) ([place])   Episode of confusion   Discharged Condition:  Stable    Significant Diagnostic Studies: Dg Chest 2 View  Result Date: 03/29/2018 CLINICAL DATA:  Pneumonia. EXAM: CHEST - 2 VIEW COMPARISON:  03/28/2018 FINDINGS: Sequelae of prior CABG are again identified. The cardiomediastinal silhouette is unchanged with normal heart size. The lungs are hypoinflated with minimal bibasilar atelectasis, improved from prior. No pleural effusion or pneumothorax is identified. No acute osseous abnormality is seen. IMPRESSION: Hypoinflation with minimal bibasilar atelectasis. Electronically Signed   By: Logan Bores M.D.   On: 03/29/2018 14:41   Dg Chest 2 View  Result Date: 03/28/2018 CLINICAL DATA:  Confusion.  Disorientation. EXAM: CHEST - 2 VIEW COMPARISON:  03/01/2018. FINDINGS: Prior CABG. Heart size normal. Normal pulmonary vascularity. Low lung volumes with mild bibasilar atelectasis/infiltrates. No pleural effusion or pneumothorax. IMPRESSION: 1.  Prior CABG.  Heart size normal. 2.  Low lung volumes with mild bibasilar atelectasis/infiltrates. Electronically Signed   By: Marcello Moores  Register   On: 03/28/2018 13:11   Ct Head Wo Contrast  Result Date: 03/28/2018 CLINICAL DATA:  Stroke.  Confusion EXAM: CT HEAD WITHOUT CONTRAST TECHNIQUE: Contiguous axial images were obtained from the base of the skull through the vertex without intravenous contrast. COMPARISON:  MRI 02/23/2018, CT 02/22/2018 FINDINGS: Brain: Acute infarct left PCA territory. Hypodensity in the left medial temporal lobe extending into the occipital  lobe. Left thalamus involved. There has been progression of infarct since the prior MRI which has extended to the occipital lobe. Small area of petechial hemorrhage has developed in the left occipital lobe measuring less than 1 cm Generalized atrophy. Mild chronic microvascular ischemic change in the white matter. Negative for mass lesion. Vascular: Negative for hyperdense vessel Skull: Negative Sinuses/Orbits: Mucosal edema paranasal sinuses. Bilateral cataract surgery Other: None IMPRESSION: Acute infarct left posterior cerebral artery territory has extended into the left occipital lobe since the prior MRI of 02/23/2018. Small subcentimeter area of petechial hemorrhage has developed in the left occipital infarct These results will be called to the ordering clinician or representative by the Radiologist Assistant, and communication documented in the PACS or zVision Dashboard. Electronically Signed   By: Franchot Gallo M.D.   On: 03/28/2018 13:08   Mr Brain Wo Contrast  Result Date: 03/28/2018 CLINICAL DATA:  Confusion, altered mental status. History of migraines, hypertension, hypercholesterolemia, diabetes, stroke. EXAM: MRI HEAD WITHOUT CONTRAST TECHNIQUE: Multiplanar, multiecho pulse sequences of the brain and surrounding structures were obtained without intravenous contrast. COMPARISON:  CT HEAD March 28, 2018 and MRI of the head Feb 23, 2018 FINDINGS: Moderately motion degraded examination. INTRACRANIAL CONTENTS: Reduced diffusion LEFT mesial temporal occipital lobes, limited assessment on ADC maps due to motion without convincing low ADC values. New patchy susceptibility artifact LEFT occipital lobe. A few scattered chronic microhemorrhages. No parenchymal brain volume loss for age. No hydrocephalus. Patchy supratentorial white matter FLAIR T2 hyperintensities compatible with mild chronic small vessel ischemic changes. Old LEFT basal ganglia and thalamus infarcts. Prominent basal ganglia T2  hyperintensities seen with chronic small vessel ischemic changes. No abnormal extra-axial fluid collections. VASCULAR: Normal major intracranial vascular flow voids present at  skull base. SKULL AND UPPER CERVICAL SPINE: No abnormal sellar expansion. No suspicious calvarial bone marrow signal. Craniocervical junction maintained. SINUSES/ORBITS: Mild paranasal sinus mucosal thickening without Air-fluid levels. Mastoid air cells are well aerated. Status post bilateral ocular lens implants.The included ocular globes and orbital contents are non-suspicious. OTHER: None. IMPRESSION: 1. Motion degraded examination. 2. Further propagation LEFT PCA territory infarct is likely subacute with petechial hemorrhage. 3. Mild chronic small vessel ischemic changes. Old LEFT basal ganglia and thalamus small infarcts. Electronically Signed   By: Elon Alas M.D.   On: 03/28/2018 18:34    Labs:  Basic Metabolic Panel: BMP Latest Ref Rng & Units 03/28/2018 03/25/2018 03/18/2018  Glucose 65 - 99 mg/dL 132(H) 130(H) 127(H)  BUN 6 - 20 mg/dL 13 29(H) 20  Creatinine 0.61 - 1.24 mg/dL 0.89 0.91 0.84  Sodium 135 - 145 mmol/L 141 138 141  Potassium 3.5 - 5.1 mmol/L 4.1 3.9 3.9  Chloride 101 - 111 mmol/L 107 106 108  CO2 22 - 32 mmol/L 27 26 26   Calcium 8.9 - 10.3 mg/dL 9.1 8.6(L) 8.7(L)    CBC: CBC Latest Ref Rng & Units 03/29/2018 03/28/2018 03/25/2018  WBC 4.0 - 10.5 K/uL 6.5 6.2 5.2  Hemoglobin 13.0 - 17.0 g/dL 15.1 14.8 13.8  Hematocrit 39.0 - 52.0 % 46.8 45.4 42.6  Platelets 150 - 400 K/uL 224 186 173    CBG: No results for input(s): GLUCAP in the last 168 hours.  Brief HPI:   Kyle Richardson is an 84 year old male with history of CAD, PAF- on Eliquis, COPD, OSA, HTN, intermittent diplopia with diagnosis of MG few days PTA to [facility] with difficulty seeing items on the right, right sided weakness and confusion. Work up revealed acute L-PCA territory infarct affecting left medial temporal lobe, left hippocampus and  left thalamus, small acute infarct left occipital lobe and no hemorrhage.  Carotid Doppler showed 50 to 69% heterogeneous and partially calcified plaque in the right carotid bifurcation.  Dr. Doy Mince with neurology recommended continuing Eliquis as well as aggressive management of risk factors.  He continued to have issues with bouts of lethargy but was showing improvement in activity tolerance.  Therapy evaluations done and showed patient to be limited by right-sided weakness with significant flexor tone,right neglect, Wernicke's aphasia with moderate to severe expressive and receptive deficits as well as confusion.  CIR was recommended due to functional deficits.   Hospital Course: Kyle Richardson was admitted to rehab 02/27/2018 for inpatient therapies to consist of PT, ST and OT at least three hours five days a week. Past admission physiatrist, therapy team and rehab RN have worked together to provide customized collaborative inpatient rehab.  Mestinon was just continued at admission as family expressed concerns that this was causing diarrhea and the patient had not filled his prescription for this prior to admission.  They additionally expressed concerns that they were not certain and sure about his new MG diagnosis.  He was maintained on Eliquis throughout his stay and has tolerated this without any adverse side effects.  Heart rate was monitored on twice daily basis and has been controlled on metoprolol.  He did continue to have loose stools with incontinence and family requested discontinuation of metformin and monitoring BS on modified diet.  Blood sugars have been monitored on AC at bedtime basis with sliding scale insulin use for elevated blood sugars.  They are to follow-up with primary MD for input regarding oral hypoglycemic after discharge.   Mentation and verbal  output has improved. IS was encouraged to help with bibasilar atelectasis. Protein supplement was added to help with hypoalonemia and  his po intake has improved. Serial check of labs showed that renal status is stable. Hospital course significant for acute episode of confusion with hallucinations and decline in functional ability.  Stat CT head was done and was negative for bleed. UA and CXR were negative for infection.  Case discussed with neurology who expressed concerns of seizure as cause of confusion. MRI of brain reported further propagation of L-PCA infarct but Dr. Leonie Man reviewed films and felt that no new changes were noted. 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   EEG done for work up and he was started on low dose Depakote ER to help with behavior as well as for seizure prophylaxis. He has had issues with insomnia and low dose Seroquel was added with improvement in sleep hygrine. His discharge was held for 3 days for work up and to help patient recover from this event.  He continues to be limited by apraxia, cognitive deficits and increase in recent confusion with worsening of neurological symptoms but expect that this will improve in more familiar environment.  He requires min to mod assist with cognitive tasks and has progressed to min assist at wheelchair level. Family was instructed not to ambulate patient at home. SNF was discussed but family elected on discharge to home with 24/7 hired sitters. He will continue to receive follow up [place], [place], [place] and bath aide by [place] after discharge.    Rehab course: During patient's stay in rehab weekly team conferences were held to monitor patient's progress, set goals and discuss barriers to discharge. At admission, patient required total assist with mobility and self-care tasks.  He demonstrated moderate to severe aphasia affecting functional communications with verbal output characterized by jargon and he required max to total assist to self-monitoring correct for errors.  He was able to answer  basic yes/no questions with 70% accuracy and demonstrated impairments in attention functional problem-solving recall and awareness. He  has had improvement in activity tolerance, balance, postural control as well as ability to compensate for deficits. He has had improvement in functional use RUE  and RLE as well as improvement in awareness. He is able to complete ADL tasks at min assist level. He requires min assist with cues for transfers and is able to propel his wheelchair with min assist fro 70'. He is able to express basic needs with mod to max cues and needs redirection for appropriate language or to initiate interaction. He requires min assist with cues for basic problem solving. Family education was completed regarding all aspects of mobility and cognitive assistance needed.  Family has hired caregivers to provide assistance.       Disposition: Home   Diet: Cardiac/Carb Modified diet.   Special Instructions: 1. Patient to be ambulated with PT only. 2. Family to assist with all tasks.   Allergies as of 04/01/2018      Reactions   Clarithromycin Other (See Comments), Diarrhea   Codeine    Flomax [tamsulosin Hcl]    Lamisil [terbinafine]    Levofloxacin Other (See Comments)   Diplopia. NEVER put patient on levoquin!   Sertraline Other (See Comments)   Sulfa Antibiotics       Medication List    STOP taking these medications   ACCU-CHEK SOFTCLIX LANCETS lancets   ONE TOUCH ULTRA TEST  test strip Generic drug:  glucose blood     TAKE these medications   acetaminophen 500 MG tablet Commonly known as:  TYLENOL Take 1 tablet (500 mg total) by mouth every 6 (six) hours as needed.   amLODipine 5 MG tablet Commonly known as:  NORVASC Take 1 tablet (5 mg total) by mouth daily.   apixaban 5 MG Tabs tablet Commonly known as:  ELIQUIS Take 1 tablet (5 mg total) by mouth 2 (two) times daily.   atorvastatin 40 MG tablet Commonly known as:  LIPITOR Take 1 tablet (40 mg total) by  mouth daily. What changed:    medication strength  how much to take   Calcium Citrate-Vitamin D 315-250 MG-UNIT Tabs Take 2 tablets by mouth daily.   divalproex 500 MG 24 hr tablet Commonly known as:  DEPAKOTE ER Take 1 tablet (500 mg total) by mouth daily.   FLUoxetine 10 MG capsule Commonly known as:  PROZAC Take 1 capsule (10 mg total) by mouth daily. What changed:  how much to take   fluticasone 50 MCG/ACT nasal spray Commonly known as:  FLONASE SPRAY 2 SPRAYS IN EACH NOSTRIL ONCE A DAY   latanoprost 0.005 % ophthalmic solution Commonly known as:  XALATAN Place 1 drop into both eyes at bedtime.   loratadine 10 MG tablet Commonly known as:  CLARITIN Take 10 mg by mouth daily.   metFORMIN 500 MG 24 hr tablet Commonly known as:  GLUCOPHAGE-XR Take 1 tablet (500 mg total) by mouth daily with breakfast.   metoprolol succinate 25 MG 24 hr tablet Commonly known as:  TOPROL-XL Take 1 tablet (25 mg total) by mouth daily. What changed:  additional instructions   multivitamin tablet Take 1 tablet by mouth daily.   nitroGLYCERIN 0.4 MG SL tablet Commonly known as:  NITROSTAT Place 0.4 mg under the tongue every 5 (five) minutes as needed. As directed   PRESERVISION AREDS 2 Caps Take 1 tablet by mouth 2 (two) times daily.   pyridostigmine 60 MG tablet Commonly known as:  MESTINON Take 1 tablet (60 mg total) by mouth 2 (two) times daily.   QUEtiapine 25 MG tablet Commonly known as:  SEROQUEL Take 1 tablet (25 mg total) by mouth 3 times/day as needed-between meals & bedtime (insomnia or agitation).      Follow-up Information    Kirsteins, Luanna Salk, MD Follow up.   Specialty:  Physical Medicine and Rehabilitation Why:  Office will call you with follow up appointment Contact information: [place] [address] [phone]        Hobson-Webb, Preston Fleeting, MD .   Specialty:  Psychiatry Contact information: [facility] [place] [address] [phone]        Mhoon, Larkin Ina, MD. Call in 1 day(s).   Why:  for follow up appointment Contact information: [address] [place] [address] [phone]        Einar Pheasant, MD Follow up.   Specialty:  Internal Medicine Why:  Dr. Bary Leriche office will call [name] with post-hospital follow up appointment. Contact information: [address] [place] [address] [phone]           Signed: Bary Leriche 04/08/2018, 5:32 PM

## 2018-03-28 NOTE — Discharge Instructions (Signed)
Inpatient Rehab Discharge Instructions  Kyle Richardson Discharge date and time:    Activities/Precautions/ Functional Status: Activity: no lifting, driving, or strenuous exercise for till cleared by MD Diet: cardiac diet and diabetic diet Wound Care:   Functional status:  ___ No restrictions     ___ Walk up steps independently _X__ 24/7 supervision/assistance   ___ Walk up steps with assistance ___ Intermittent supervision/assistance  ___ Bathe/dress independently ___ Walk with walker     _X__ Bathe/dress with assistance ___ Walk Independently    ___ Shower independently ___ Walk with assistance    ___ Shower with assistance _X__ No alcohol     ___ Return to work/school ________  COMMUNITY REFERRALS UPON DISCHARGE:   Home Health:   [place] Housing:  [place] Phone:  [phone] Medical Equipment/Items Ordered:  Rolling walker; bedside commode; tub transfer bench; 18"x18" wheelchair with basic cushion  Agency/Supplier:  [facility]         Phone:  [phone]  GENERAL COMMUNITY RESOURCES FOR PATIENT/FAMILY: Support Groups:  [employer] Stroke Support Group                              Meets the second Thursday of each month from 3-4PM (except June, July, August)                              In the dayroom of [facility], on [address] at [facility]                              For more information, please call [phone]  Special Instructions:  STROKE/TIA DISCHARGE INSTRUCTIONS SMOKING Cigarette smoking nearly doubles your risk of having a stroke & is the single most alterable risk factor  If you smoke or have smoked in the last 12 months, you are advised to quit smoking for your health.  Most of the excess cardiovascular risk related to smoking disappears within a year of stopping.  Ask you doctor about anti-smoking medications  [place] Quit Line: 1-800-QUIT NOW  Free Smoking Cessation  Classes [phone]  CHOLESTEROL Know your levels; limit fat & cholesterol in your diet  Lipid Panel     Component Value Date/Time   CHOL 167 02/23/2018 0354   CHOL 129 08/13/2013 1026   TRIG 144 02/23/2018 0354   TRIG 166 08/13/2013 1026   HDL 43 02/23/2018 0354   HDL 47 08/13/2013 1026   CHOLHDL 3.9 02/23/2018 0354   VLDL 29 02/23/2018 0354   VLDL 33 08/13/2013 1026   [place] 95 02/23/2018 0354   LDLCALC 49 08/13/2013 1026      Many patients benefit from treatment even if their cholesterol is at goal.  Goal: Total Cholesterol (CHOL) less than 160  Goal:  Triglycerides (TRIG) less than 150  Goal:  HDL greater than 40  Goal:  LDL (LDLCALC) less than 100   BLOOD PRESSURE American Stroke Association blood pressure target is less that 120/80 mm/Hg  Your discharge blood pressure is:  BP: 133/63  Monitor your blood pressure  Limit your salt and alcohol intake  Many individuals will require more than one medication for high blood pressure  DIABETES (A1c is a  blood sugar average for last 3 months) Goal HGBA1c is under 7% (HBGA1c is blood sugar average for last 3 months)  Diabetes:     Lab Results  Component Value Date   HGBA1C 7.1 (H) 02/23/2018     Your HGBA1c can be lowered with medications, healthy diet, and exercise.  Check your blood sugar as directed by your physician  Call your physician if you experience unexplained or low blood sugars.  PHYSICAL ACTIVITY/REHABILITATION Goal is 30 minutes at least 4 days per week  Activity: No driving, Therapies: see above Return to work: N/A  Activity decreases your risk of heart attack and stroke and makes your heart stronger.  It helps control your weight and blood pressure; helps you relax and can improve your mood.  Participate in a regular exercise program.  Talk with your doctor about the best form of exercise for you (dancing, walking, swimming, cycling).  DIET/WEIGHT Goal is to maintain a healthy weight  Your  discharge diet is:  Diet Order           Diet heart healthy/carb modified Room service appropriate? Yes; Fluid consistency: Thin  Diet effective now         liquids Your height is:  Height: 5\' 8"  (172.7 cm) Your current weight is: Weight: 73.6 kg (162 lb 4.1 oz) Your Body Mass Index (BMI) is:  BMI (Calculated): 24.68  Following the type of diet specifically designed for you will help prevent another stroke.  You are at goal weight   Your goal Body Mass Index (BMI) is 19-24.  Healthy food habits can help reduce 3 risk factors for stroke:  High cholesterol, hypertension, and excess weight.  RESOURCES Stroke/Support Group:  Call [phone]   STROKE EDUCATION PROVIDED/REVIEWED AND GIVEN TO PATIENT Stroke warning signs and symptoms How to activate emergency medical system (call 911). Medications prescribed at discharge. Need for follow-up after discharge. Personal risk factors for stroke. Pneumonia vaccine given:  Flu vaccine given:  My questions have been answered, the writing is legible, and I understand these instructions.  I will adhere to these goals & educational materials that have been provided to me after my discharge from the hospital.     My questions have been answered and I understand these instructions. I will adhere to these goals and the provided educational materials after my discharge from the hospital.  Patient/Caregiver Signature _______________________________ Date __________  Clinician Signature _______________________________________ Date __________  Please bring this form and your medication list with you to all your follow-up doctor's appointments.   Information on my medicine - ELIQUIS (apixaban)  This medication education was reviewed with me or my healthcare representative as part of my discharge preparation.   Why was Eliquis prescribed for you? Eliquis was prescribed for you to reduce the risk of forming blood clots that can cause a stroke if  you have a medical condition called atrial fibrillation (a type of irregular heartbeat) OR to reduce the risk of a blood clots forming after orthopedic surgery.  What do You need to know about Eliquis ? Take your Eliquis TWICE DAILY - one tablet in the morning and one tablet in the evening with or without food.  It would be best to take the doses about the same time each day.  If you have difficulty swallowing the tablet whole please discuss with your pharmacist how to take the medication safely.  Take Eliquis exactly as prescribed by your doctor and DO NOT stop taking Eliquis without talking to  the doctor who prescribed the medication.  Stopping may increase your risk of developing a new clot or stroke.  Refill your prescription before you run out.  After discharge, you should have regular check-up appointments with your healthcare provider that is prescribing your Eliquis.  In the future your dose may need to be changed if your kidney function or weight changes by a significant amount or as you get older.  What do you do if you miss a dose? If you miss a dose, take it as soon as you remember on the same day and resume taking twice daily.  Do not take more than one dose of ELIQUIS at the same time.  Important Safety Information A possible side effect of Eliquis is bleeding. You should call your healthcare provider right away if you experience any of the following: ? Bleeding from an injury or your nose that does not stop. ? Unusual colored urine (red or dark brown) or unusual colored stools (red or black). ? Unusual bruising for unknown reasons. ? A serious fall or if you hit your head (even if there is no bleeding).  Some medicines may interact with Eliquis and might increase your risk of bleeding or clotting while on Eliquis. To help avoid this, consult your healthcare provider or pharmacist prior to using any new prescription or non-prescription medications, including herbals,  vitamins, non-steroidal anti-inflammatory drugs (NSAIDs) and supplements.  This website has more information on Eliquis (apixaban): [email or web address].

## 2018-03-28 NOTE — Patient Care Conference (Signed)
Inpatient RehabilitationTeam Conference and Plan of Care Update Date: 03/27/2018   Time: 11:00 AM    Patient Name: Kyle Richardson      Medical Record Number: [id number]  Date of Birth: Mar 14, 1928 Sex: Male         Room/Bed: 4W24C/4W24C-01 Payor Info: Payor: [employer] / Plan: [employer] Must / Product Type: *No Product type* /    Admitting Diagnosis: L CVA  Admit Date/Time:  02/27/2018  3:28 PM Admission Comments: No comment available   Primary Diagnosis:  Acute ischemic left PCA stroke (HCC) Principal Problem: Acute ischemic left PCA stroke [facility])  Patient Active Problem List   Diagnosis Date Noted  . Labile blood pressure   . Acute ischemic left PCA stroke ([place]) 02/27/2018  . Diabetes mellitus type 2 in nonobese (HCC)   . Glaucoma   . MG, ocular (myasthenia gravis) ([place])   . CVA (cerebral vascular accident) ([place]) 02/24/2018  . Right homonymous hemianopsia 02/23/2018  . Unresponsive episode 02/22/2018  . UTI (urinary tract infection) 01/28/2018  . Abnormal chest CT 09/20/2017  . Double vision 01/21/2017  . Ascending aortic aneurysm ([place]) 12/22/2016  . Blood-tinged sputum 10/23/2016  . Cough 10/23/2016  . PAF (paroxysmal atrial fibrillation) ([place]) 09/14/2016  . Health care maintenance 12/26/2014  . Stress 07/05/2014  . Migraine 05/10/2013  . Type 2 diabetes mellitus with complication, without long-term current use of insulin ([place]) 08/20/2012  . Hypertension 08/20/2012  . Hypercholesterolemia 08/20/2012  . CAD (coronary artery disease) 08/20/2012    Expected Discharge Date: Expected Discharge Date: 03/28/18  Team Members Present: Physician leading conference: Dr. Alysia Penna Social Worker Present: Alfonse Alpers, LCSW Nurse Present: Other (comment)(Kayla Mabe, LPN) PT Present: Michaelene Song, PT OT Present: Cherylynn Ridges, OT SLP Present: Weston Anna, SLP PPS Coordinator present : Daiva Nakayama, RN, CRRN     Current Status/Progress Goal Weekly Team  Focus  Medical   aphasia , apraxia and RIght neglect improving     D/C planning   Bowel/Bladder   incontinent of bowel and bladder, LBM 6-17  continent of bowel and bladder with mod assist  timed toileting q 3,   Swallow/Nutrition/ Hydration             ADL's   Min/mod A transfers, min A overall BADLs  MIn A  R attention, R apraxia, R visual scanning, transfers, modified bathing/dressing, pt/family education   Mobility   min-mod assist for transfers, supervision bed mobility, gait up to 41 ft with RW min-mod assist  min assist  gait, education, transfers, d/c planning, R neuro re-ed   Communication   Min-Mod A   Overall Min A  Family Education    Safety/Cognition/ Behavioral Observations  Mod-Max A  Mod A   Family Education    Pain   no c/o pain  no pain  Assess pain q shift and prn   Skin   MASD to peri area, barrier cream and microguard powder in use  resolving skin issues, no new skin issues  Assess skin q shift and prn    Rehab Goals Patient on target to meet rehab goals: Yes Rehab Goals Revised: none *See Care Plan and progress notes for long and short-term goals.     Barriers to Discharge  Current Status/Progress Possible Resolutions Date Resolved   Physician    Other (comments)  Sensory deficits  progressing well  D/C home in am after caregivier training      Nursing  PT                    OT                  SLP                SW                Discharge Planning/Teaching Needs:  Pt's family intends to take pt home and family/paid caregivers will provide 24/7 minimal assistance.  Family has come for some education, but caregivers will not be in place for them to come.   Team Discussion:  Pt is doing better, still with some ataxia.  Pt has improved neurologically.  Pt is continent of urine with timed toileting, but will not call when he needs to go and uses brief.  Pt's RUE has improved and he is more aware of it.  He can also locate  things better.  Pt is min A to left for txs, to the right he is mod A with cues.  Pt to use w/c at home.  Dtr saw PT session and knows pt should not ambulate at home unless [facility] therapists are present.   Pt's vision is improving, but still can't read, identify letters, despite trying really hard.  Revisions to Treatment Plan:  none    Continued Need for Acute Rehabilitation Level of Care: The patient requires daily medical management by a physician with specialized training in physical medicine and rehabilitation for the following conditions: Daily direction of a multidisciplinary physical rehabilitation program to ensure safe treatment while eliciting the highest outcome that is of practical value to the patient.: Yes Daily medical management of patient stability for increased activity during participation in an intensive rehabilitation regime.: Yes Daily analysis of laboratory values and/or radiology reports with any subsequent need for medication adjustment of medical intervention for : Neurological problems  Diron Haddon, [name] 03/28/2018, 8:24 PM

## 2018-03-28 NOTE — Progress Notes (Signed)
Pt very confused this morning cont'd from last night, alert and oriented to self at this time. Pt in bed with call bell in reach and side rails up x 4 d/t pt attempt to get OOB. Pt unable to feed self during breakfast. Seems to be moving RUE more and trying to feed with R hand but nothing in R hand and puts hand in mouth. NT and LPN seen very different change in pt this morning. PA notified and will go see. Will cont to monitor. Pamella Pert, LPN

## 2018-03-28 NOTE — Progress Notes (Signed)
Pt is very confused. Pt is oriented to self, disoriented to time, place, and situation. Per day shift nurse pt has been experiencing confusion at night.  Pt does show signs of pain upon urinating per tech. Marlowe Shores, PA-C notified via telephone. Urinalysis and urine cultured ordered. Will continue to monitor patient.

## 2018-03-28 NOTE — Progress Notes (Signed)
Discussed results as well as patient's symptoms with Shanon Brow PA for [facility] neurology. He recommended MRI brain as well as EEG to rule out seizures as cause for confusion.

## 2018-03-28 NOTE — Progress Notes (Addendum)
Physical Therapy Session Note  Patient Details  Name: Kyle Richardson MRN: [id number] Date of Birth: 08/30/1928  Today's Date: 03/28/2018 PT Individual Time: 1105-1115 PT Individual Time Calculation (min): 10 min   Short Term Goals: Week 4:  PT Short Term Goal 1 (Week 4): =LTGs due to ELOS  Skilled Therapeutic Interventions/Progress Updates:   Per chart review and verbal discussion w/ RN, pt not himself this morning, combative and agitated last night, and appearing to have overall cognitive and sequencing decline this morning. Pt actively moving all extremities, however decreased coordination w/ R hemi side and attempting to pour drink onto R hand instead of use L hand to drink despite max cues. This is a meaningful change, as he has been consistently following commands during functional mobility for multiple weeks, demonstrating only mild functional sequencing deficits. No increased R hemi weakness or tone noticed in supine, however decreased attention and awareness overall and especially w/ R side. Pt unable to state name of son-in-law who was in room despite max cues. RN and PA-C also present, discussed impression of pt in light of medical status and cognitive/functional change. Both report pt will go for CT scan to check for worsening of stroke. Will follow up this afternoon if medically appropriate.   Addendum: Per chart review and verbal discussion w/ PA-C who stated worsening of stroke is likely, based on initial imaging. Pt will continue to benefit from skilled PT, continue current plan of care as medically appropriate.   Therapy Documentation Precautions:  Precautions Precautions: Fall Precaution Comments: R hemiparesis, ataxia Restrictions Weight Bearing Restrictions: No  See Function Navigator for Current Functional Status.   Therapy/Group: Individual Therapy  Shakemia Madera K Arnette 03/28/2018, 3:19 PM

## 2018-03-28 NOTE — Progress Notes (Signed)
EEG Complete. Results pending. 

## 2018-03-28 NOTE — Progress Notes (Signed)
Social Work Patient ID: Kyle Richardson, male   DOB: Oct 25, 1927, 84 y.o.   MRN: [id number]   Pt's scheduled d/c for today has been canceled due to medical changes.  Pt and family have been informed and son-in-law is present in pt's room.  Medical team to update pt/family as results from medical work up are available.  CSW remains available to assist as needed.

## 2018-03-28 NOTE — Progress Notes (Signed)
CRITICAL VALUE ALERT  Critical Value:  Head CT results  Date & Time Notied:  03/28/18 1320  Provider Notified: Algis Liming, PA  Orders Received/Actions taken: Left message on PA phone and written message on PA desk.  Verbalized results to Marlowe Shores, [place] as well.  New orders received for IV insertion and IVF carried out.  Awaiting additional testing.  Patient family at bedside.  Call bell within reach, bed alarm engaged.  Brita Romp, RN

## 2018-03-28 NOTE — Progress Notes (Signed)
Patient transported to EEG for testing.  Will try to coordinate and get MRI done while down in radiology.  Will continue to monitor.  Brita Romp, RN

## 2018-03-28 NOTE — Consult Note (Signed)
NEURO HOSPITALIST CONSULT NOTE   Requestig physician: Dr. Letta Pate  Reason for Consult: Confusion  History obtained from:  Chart    HPI:                                                                                                                                          Kyle Richardson is an 84 y.o. male admitted to the physical medicine and rehab service after sustaining a left PCA ischemic infarction, which was first evaluated at [facility]. Echocardiogram revealed an EF 60-65% with mild LVH, basal inferior hypokinesis and grade 1 diastolic dysfunction. Carotid dopplers showed 50-69% heterogenous and partially calcified plaque at right carotid bifurcation. His deficits from the stroke include right sided weakness, right hemineglect and Wernicke's aphasia. Of note, he has a history of myasthenia gravis with intermittent diplopia, treated with Mestinon; this has not been an active problem this admission. Neuropsych evaluation was performed on 6/5, which revealed no definite psychiatric morbidity other than the cognitive and speech deficits resulting from his stroke.   He has multiple stroke risk factors including CAD, DM, hypercholesterolemia, PAF (on Eliquis) HTN and sleep apnea. While on the PM&R service, he has been experiencing confusion. Per nursing yesterday he was severely agitated with paranoid delusions, exhibiting aggressive behavior towards the nurses, whom he thought were trying to harm him. He has since returned to baseline.   Neurology was consulted today for the episode of agitation, with DDx for such including seizures and extension of stroke. MRI was read as being consistent with extension of the previously diagnosed stroke; however, on my review of the images, there does not appear to be any extension.   Past Medical History:  Diagnosis Date  . 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  s/p PTCA of the mid RCA 1/96, also  s/p   CABG x 4 7/96  . Degenerative disc disease   . Diabetes mellitus ([place])   . Glaucoma   . Hypercholesterolemia   . Hypertension   . Migraine headache    history of  . Osteoarthritis   . Sleep apnea     Past Surgical History:  Procedure Laterality Date  . APPENDECTOMY    . CHOLECYSTECTOMY     Removed  . CORONARY ARTERY BYPASS GRAFT  7/96   x4  . HEMORRHOID SURGERY    . HERNIA REPAIR    . UPP and tonsillectomy  1/86    Family History  Problem Relation Age of Onset  . Heart disease Mother        died age 59  . Heart disease Father        and other vascular disease  . Diabetes Unknown        four siblings  .  Heart disease Brother        s/p CABG  . Colon cancer Neg Hx   . Prostate cancer Neg Hx               Social History:  reports that he has never smoked. He has never used smokeless tobacco. He reports that he does not drink alcohol or use drugs.  Allergies  Allergen Reactions  . Clarithromycin Other (See Comments) and Diarrhea  . Codeine   . Flomax [Tamsulosin Hcl]   . Lamisil [Terbinafine]   . Levofloxacin Other (See Comments)    Diplopia. NEVER put patient on levoquin!  . Sertraline Other (See Comments)  . Sulfa Antibiotics     MEDICATIONS:                                                                                                                     Scheduled: . acetaminophen  500 mg Oral TID WC & HS  . amLODipine  5 mg Oral Daily  . apixaban  5 mg Oral BID  . atorvastatin  40 mg Oral Daily  . calcium-vitamin D  1 tablet Oral Daily  . feeding supplement (PRO-STAT SUGAR FREE 64)  30 mL Oral BID  . FLUoxetine  10 mg Oral Daily  . fluticasone  1 spray Each Nare Daily  . insulin aspart  0-5 Units Subcutaneous QHS  . insulin aspart  0-9 Units Subcutaneous TID WC  . latanoprost  1 drop Both Eyes QHS  . loratadine  10 mg Oral Daily  . mouth rinse  15 mL Mouth Rinse BID  . metoprolol succinate  25 mg Oral Daily  . multivitamin  1 tablet Oral BID  .  multivitamin with minerals  1 tablet Oral Daily  . protein supplement shake  11 oz Oral TID WC & HS   Continuous: . sodium chloride 75 mL/hr (03/28/18 2216)     ROS:                                                                                                                                       As per HPI. The patient is without complaints at time of neurology evaluation.    Blood pressure (!) 146/69, pulse (!) 53, temperature 98 F (36.7 C), temperature source Oral, resp. rate 18, height 5\' 8"  (1.727 m), weight 73 kg (160 lb 15 oz), SpO2 96 %.  General Examination:                                                                                                       Physical Exam  HEENT-  [place]/AT   Lungs- Respirations unlabored Extremities- Warm and well perfused Skin-warm and dry  Neurological Examination Mental Status: Awake and alert. Able to follow all commands. Speech fluent. Non-agitated. Pleasant and cooperative. Mild right hemineglect.  Cranial Nerves: II: Fixates upon visual stimuli and tracks normally.  Blinks to threat on the left, but not on the right. PERRL III,IV, VI: EOMI without nystagmus. No ptosis.  V,VII: Smile symmetric, facial temp sensation intact bilaterally VIII: HOH IX,X: No hoarseness or hypophonia XI: Symmetric XII: midline tongue extension Motor: Right : Upper extremity   5/5    Left:     Upper extremity   5/5  Lower extremity   5/5     Lower extremity   5/5 Sensory: Temp and light touch intact x 4.  Deep Tendon Reflexes: Normoactive without asymmetry.  Cerebellar: No ataxia with FNF bilaterally.  Gait: Deferred   Lab Results: Basic Metabolic Panel: Recent Labs  Lab 03/25/18 0530 03/28/18 0917  NA 138 141  K 3.9 4.1  CL 106 107  CO2 26 27  GLUCOSE 130* 132*  BUN 29* 13  CREATININE 0.91 0.89  CALCIUM 8.6* 9.1    CBC: Recent Labs  Lab 03/25/18 0530 03/28/18 0917  WBC 5.2 6.2  NEUTROABS  --  3.4  HGB 13.8 14.8  HCT 42.6  45.4  MCV 95.7 96.8  PLT 173 186    Cardiac Enzymes: No results for input(s): CKTOTAL, CKMB, CKMBINDEX, TROPONINI in the last 168 hours.  Lipid Panel: No results for input(s): CHOL, TRIG, HDL, CHOLHDL, VLDL, LDLCALC in the last 168 hours.  Imaging: Dg Chest 2 View  Result Date: 03/28/2018 CLINICAL DATA:  Confusion.  Disorientation. EXAM: CHEST - 2 VIEW COMPARISON:  03/01/2018. FINDINGS: Prior CABG. Heart size normal. Normal pulmonary vascularity. Low lung volumes with mild bibasilar atelectasis/infiltrates. No pleural effusion or pneumothorax. IMPRESSION: 1.  Prior CABG.  Heart size normal. 2.  Low lung volumes with mild bibasilar atelectasis/infiltrates. Electronically Signed   By: Marcello Moores  Register   On: 03/28/2018 13:11   Ct Head Wo Contrast  Result Date: 03/28/2018 CLINICAL DATA:  Stroke.  Confusion EXAM: CT HEAD WITHOUT CONTRAST TECHNIQUE: Contiguous axial images were obtained from the base of the skull through the vertex without intravenous contrast. COMPARISON:  MRI 02/23/2018, CT 02/22/2018 FINDINGS: Brain: Acute infarct left PCA territory. Hypodensity in the left medial temporal lobe extending into the occipital lobe. Left thalamus involved. There has been progression of infarct since the prior MRI which has extended to the occipital lobe. Small area of petechial hemorrhage has developed in the left occipital lobe measuring less than 1 cm Generalized atrophy. Mild chronic microvascular ischemic change in the white matter. Negative for mass lesion. Vascular: Negative for hyperdense vessel Skull: Negative Sinuses/Orbits: Mucosal edema paranasal sinuses. Bilateral cataract surgery Other: None IMPRESSION: Acute infarct left posterior cerebral artery territory has extended  into the left occipital lobe since the prior MRI of 02/23/2018. Small subcentimeter area of petechial hemorrhage has developed in the left occipital infarct These results will be called to the ordering clinician or  representative by the Radiologist Assistant, and communication documented in the PACS or zVision Dashboard. Electronically Signed   By: Franchot Gallo M.D.   On: 03/28/2018 13:08   Mr Brain Wo Contrast  Result Date: 03/28/2018 CLINICAL DATA:  Confusion, altered mental status. History of migraines, hypertension, hypercholesterolemia, diabetes, stroke. EXAM: MRI HEAD WITHOUT CONTRAST TECHNIQUE: Multiplanar, multiecho pulse sequences of the brain and surrounding structures were obtained without intravenous contrast. COMPARISON:  CT HEAD March 28, 2018 and MRI of the head Feb 23, 2018 FINDINGS: Moderately motion degraded examination. INTRACRANIAL CONTENTS: Reduced diffusion LEFT mesial temporal occipital lobes, limited assessment on ADC maps due to motion without convincing low ADC values. New patchy susceptibility artifact LEFT occipital lobe. A few scattered chronic microhemorrhages. No parenchymal brain volume loss for age. No hydrocephalus. Patchy supratentorial white matter FLAIR T2 hyperintensities compatible with mild chronic small vessel ischemic changes. Old LEFT basal ganglia and thalamus infarcts. Prominent basal ganglia T2 hyperintensities seen with chronic small vessel ischemic changes. No abnormal extra-axial fluid collections. VASCULAR: Normal major intracranial vascular flow voids present at skull base. SKULL AND UPPER CERVICAL SPINE: No abnormal sellar expansion. No suspicious calvarial bone marrow signal. Craniocervical junction maintained. SINUSES/ORBITS: Mild paranasal sinus mucosal thickening without Air-fluid levels. Mastoid air cells are well aerated. Status post bilateral ocular lens implants.The included ocular globes and orbital contents are non-suspicious. OTHER: None. IMPRESSION: 1. Motion degraded examination. 2. Further propagation LEFT PCA territory infarct is likely subacute with petechial hemorrhage. 3. Mild chronic small vessel ischemic changes. Old LEFT basal ganglia and thalamus  small infarcts. Electronically Signed   By: Elon Alas M.D.   On: 03/28/2018 18:34    Assessment: 84 year old male with episode of agitated delirium yesterday 1. MRI was read as being consistent with extension of the previously diagnosed stroke; however, on my review of the images, there does not appear to be any extension. 2. EEG complete, with results pending  Recommendations: 1. Await EEG report, which is expected to be low-yield 2. Limit sedating meds, orient patient frequently, maintain diurnal cycle, ambulate regularly, OOB to chair QD   Electronically signed: Dr. Kerney Elbe 03/28/2018, 9:13 PM

## 2018-03-28 NOTE — Progress Notes (Signed)
At this time patient is awake and alert. He denies pain or discomfort and is calm at this time. Nursing will continue to monitor.

## 2018-03-28 NOTE — Progress Notes (Signed)
Pt is very confused stating" leave me alone, Im not going to sleep, when am I going home, I don't want to have sex, what do y'all get out of it, Im to old". Pt is refusing to stay in bed, pt has tried to hit nursing staff. Pt is very agitated. Dan [place] notified via telephone. Ativan 1mg  IM once ordered. Will continue to monitor patient.

## 2018-03-28 NOTE — Progress Notes (Signed)
Social Work Patient ID: Kyle Richardson, male   DOB: October 07, 1928, 84 y.o.   MRN: [id number]   CSW met with pt and spoke with pt's dtr to update them on team conference and to discuss final details of pt's d/c.  Dtr to have caregivers in place for Thursday night and going forward.  Her husband will come to take pt home and receive some family education about car transfers.  CSW has DME ordered and [place] arranged.  CSW remains available to assist as needed.

## 2018-03-28 NOTE — Progress Notes (Signed)
Occupational Therapy Discharge Summary  Patient Details  Name: Kyle Richardson MRN: [id number] Date of Birth: [birth year]   Patient has met 108 of 12 long term goals due to improved activity tolerance, improved balance, postural control, ability to compensate for deficits, functional use of  RIGHT upper and RIGHT lower extremity, improved attention, improved awareness and improved coordination.  Patient to discharge at [facility] Assist level.  Patient's care partner is independent to provide the necessary physical and cognitive assistance at discharge.    Reasons goals not met: Pt needs occasional mod A for shower transfers.  Recommendation:  Patient will benefit from ongoing skilled OT services in home health setting to continue to advance functional skills in the area of BADL and Reduce care partner burden.  Equipment: RW, tub transfer bench, 18x18 wc, 3-in-1 BSC  Reasons for discharge: treatment goals met and discharge from hospital  Patient/family agrees with progress made and goals achieved: Yes  OT Discharge Precautions/Restrictions  Precautions Precautions: Fall Precaution Comments: R hemiparesis, ataxia Restrictions Weight Bearing Restrictions: No Pain  none/denies pain ADL ADL ADL Comments: Please see functional navigator Vision Alignment/Gaze Preference: Gaze left Perception  Perception: Impaired Inattention/Neglect: Does not attend to right side of body;Does not attend to right visual field Praxis Praxis: Impaired Praxis Impairment Details: Ideation;Initiation;Motor planning;Perseveration Cognition Overall Cognitive Status: Impaired/Different from baseline Arousal/Alertness: Awake/alert Orientation Level: Oriented to person Attention: Focused;Sustained Focused Attention: Appears intact Sustained Attention: Impaired Sustained Attention Impairment: Verbal basic;Functional basic Memory: Impaired Memory Impairment: Storage deficit;Decreased long term  memory;Retrieval deficit;Decreased short term memory Decreased Long Term Memory: Verbal basic Decreased Short Term Memory: Verbal basic Awareness: Impaired Awareness Impairment: Emergent impairment Problem Solving: Impaired Problem Solving Impairment: Verbal basic;Functional basic Behaviors: Perseveration Safety/Judgment: Impaired Sensation Sensation Light Touch: Impaired by gross assessment Light Touch Impaired Details: Impaired RUE;Impaired RLE Proprioception: Impaired by gross assessment Proprioception Impaired Details: Impaired RUE;Impaired RLE Coordination Gross Motor Movements are Fluid and Coordinated: No Fine Motor Movements are Fluid and Coordinated: No Coordination and Movement Description: Ataxic and apraxic R UE/LE  Finger Nose Finger Test: dysmetria, difficulty nuderstanding concept  Motor  Motor Motor: Hemiplegia;Abnormal tone;Ataxia;Motor apraxia Motor - Skilled Clinical Observations: R hemi Mobility  Bed Mobility Bed Mobility: Rolling Right;Rolling Left;Supine to Sit;Sit to Supine Rolling Right: Supervision/verbal cueing Rolling Left: Supervision/Verbal cueing Supine to Sit: Supervision/Verbal cueing Sit to Supine: Supervision/Verbal cueing Transfers Sit to Stand: Minimal Assistance - Patient > 75% Sit to Stand Details: Verbal cues for technique;Verbal cues for precautions/safety Stand to Sit: Minimal Assistance - Patient > 75% Stand to Sit Details (indicate cue type and reason): Verbal cues for technique;Verbal cues for precautions/safety  Trunk/Postural Assessment  Cervical Assessment Cervical Assessment: Within Functional Limits Thoracic Assessment Thoracic Assessment: Within Functional Limits Lumbar Assessment Lumbar Assessment: Within Functional Limits Postural Control Postural Control: Deficits on evaluation Trunk Control: impaired in standing Protective Responses: impaired  Balance Balance Balance Assessed: Yes Static Sitting Balance Static  Sitting - Level of Assistance: 5: Stand by assistance Dynamic Sitting Balance Dynamic Sitting - Balance Support: No upper extremity supported;Feet supported;During functional activity Dynamic Sitting - Level of Assistance: 5: Stand by assistance Static Standing Balance Static Standing - Level of Assistance: 4: Min assist Dynamic Standing Balance Dynamic Standing - Level of Assistance: 4: Min assist Extremity/Trunk Assessment RUE Assessment RUE Assessment: Exceptions to [facility] RUE Body System: Neuro Brunstrum levels for arm and hand: Arm;Hand Brunstrum level for arm: Stage IV Movement is deviating from synergy Brunstrum level for hand: Stage IV Movements deviating from synergies  RUE Strength RUE Overall Strength: Deficits RUE Tone RUE Tone: Modified Ashworth;Moderate Modified Ashworth Scale for Grading Hypertonia RUE: Slight increase in muscle tone, manifested by a catch, followed by minimal resistance throughout the remainder (less than half) of the ROM RUE Tone Comments: Apraxia, ataxia, and perception deficits  LUE Assessment LUE Assessment: Within Functional Limits   See Function Navigator for Current Functional Status.  Daneen Schick Renata Gambino 04/01/2018, 3:04 PM

## 2018-03-28 NOTE — Progress Notes (Addendum)
Pt returned to unit via bed by transport. Able to complete all imaging. Pt received 0.5 mg Ativan prior to MRI d/t unable to perform for pt being confused and not staying still. Pt is now asleep. Son in Sports coach at bedside. Bed alarm on and side rails up x 4. Call bell in reach. Will cont to monitor.  Pamella Pert, LPN

## 2018-03-29 ENCOUNTER — Inpatient Hospital Stay (HOSPITAL_COMMUNITY): Payer: PPO | Admitting: Speech Pathology

## 2018-03-29 ENCOUNTER — Inpatient Hospital Stay (HOSPITAL_COMMUNITY): Payer: PPO | Admitting: Physical Therapy

## 2018-03-29 ENCOUNTER — Inpatient Hospital Stay (HOSPITAL_COMMUNITY): Payer: PPO | Admitting: Occupational Therapy

## 2018-03-29 ENCOUNTER — Inpatient Hospital Stay (HOSPITAL_COMMUNITY): Payer: PPO

## 2018-03-29 DIAGNOSIS — R404 Transient alteration of awareness: Secondary | ICD-10-CM

## 2018-03-29 DIAGNOSIS — R569 Unspecified convulsions: Secondary | ICD-10-CM

## 2018-03-29 DIAGNOSIS — R4182 Altered mental status, unspecified: Secondary | ICD-10-CM

## 2018-03-29 LAB — CBC
HEMATOCRIT: 46.8 % (ref 39.0–52.0)
Hemoglobin: 15.1 g/dL (ref 13.0–17.0)
MCH: 31.2 pg (ref 26.0–34.0)
MCHC: 32.3 g/dL (ref 30.0–36.0)
MCV: 96.7 fL (ref 78.0–100.0)
Platelets: 224 10*3/uL (ref 150–400)
RBC: 4.84 MIL/uL (ref 4.22–5.81)
RDW: 13.2 % (ref 11.5–15.5)
WBC: 6.5 10*3/uL (ref 4.0–10.5)

## 2018-03-29 LAB — URINE CULTURE

## 2018-03-29 LAB — FOLATE: FOLATE: 30.5 ng/mL (ref >5.9)

## 2018-03-29 LAB — GLUCOSE, CAPILLARY
GLUCOSE-CAPILLARY: 123 mg/dL — AB (ref 65–99)
Glucose-Capillary: 104 mg/dL — ABNORMAL HIGH (ref 65–99)
Glucose-Capillary: 119 mg/dL — ABNORMAL HIGH (ref 65–99)
Glucose-Capillary: 168 mg/dL — ABNORMAL HIGH (ref 65–99)

## 2018-03-29 LAB — VITAMIN B12: Vitamin B-12: 675 pg/mL (ref 180–914)

## 2018-03-29 LAB — SEDIMENTATION RATE: SED RATE: 5 mm/h (ref 0–16)

## 2018-03-29 LAB — TSH: TSH: 2.098 u[IU]/mL (ref 0.350–4.500)

## 2018-03-29 MED ORDER — DIVALPROEX SODIUM ER 500 MG PO TB24
500.0000 mg | ORAL_TABLET | Freq: Every day | ORAL | Status: DC
  Administered 2018-03-29 – 2018-03-31 (×3): 500 mg via ORAL
  Filled 2018-03-29 (×3): qty 1

## 2018-03-29 MED ORDER — DIVALPROEX SODIUM ER 500 MG PO TB24
500.0000 mg | ORAL_TABLET | Freq: Every day | ORAL | Status: DC
  Filled 2018-03-29: qty 1

## 2018-03-29 NOTE — Progress Notes (Signed)
Speech Language Pathology Weekly Progress and Session Note  Patient Details  Name: Kyle Richardson MRN: [id number] Date of Birth: 07/18/1928  Beginning of progress report period: March 21, 2018 End of progress report period: March 29, 2018  Today's Date: 03/29/2018 SLP Individual Time: 4037-0964 SLP Individual Time Calculation (min): 55 min  Short Term Goals: Week 4: SLP Short Term Goal 1 (Week 4): Patient will attend to right field of enviornment during functional tasks with supervision A multimodal cues.  SLP Short Term Goal 1 - Progress (Week 4): Not met SLP Short Term Goal 2 (Week 4): Patient will demonstrate selective attention to tasks for ~30 minutes with Min A verbal cues for redirection in a mildly distracting enviornment.  SLP Short Term Goal 2 - Progress (Week 4): Met SLP Short Term Goal 3 (Week 4): Patient will demonstrate functional problem solving for basic and familiar tasks with Min A verbal cues.  SLP Short Term Goal 3 - Progress (Week 4): Met SLP Short Term Goal 4 (Week 4): Patient will self-monitor and correct verbal errors at the phrase level during strutured tasks with Min A verbal cues.  SLP Short Term Goal 4 - Progress (Week 4): Met SLP Short Term Goal 5 (Week 4): Patient will follow 2-step commands with Mod A verbal cues.  SLP Short Term Goal 5 - Progress (Week 4): Met    New Short Term Goals: Week 5: SLP Short Term Goal 1 (Week 5): STGs=LTGs  Weekly Progress Updates: Patient continues to make functional gains and has met 4 of 5 STG's this reporting period. Patient had plans to d/c home yesterday, however, demonstrated a change in function, therefore, patient's d/c has now been moved to 6/24. Overall, patient requires Min A verbal cues for auditory comprehension of basic information and for word-finding during a basic and informal conversation. Patient also requires Min A for attention and basic problem solving but requires increased cues for recall and attention to  right field of environment. Patient and family education ongoing. Patient would benefit from continued skilled SLP intervention to maximize his cognitive-linguistic function prior to d/c.      Intensity: Minumum of 1-2 x/day, 30 to 90 minutes Frequency: 3 to 5 out of 7 days Duration/Length of Stay: 04/01/18 Treatment/Interventions: Cognitive remediation/compensation;Environmental controls;Internal/external aids;Speech/Language facilitation;Therapeutic Activities;Patient/family education;Functional tasks;Cueing hierarchy   Daily Session  Skilled Therapeutic Interventions: Skilled treatment session focused on cognitive-linguistic goals. Upon arrival, patient was awake and alert. SLP facilitated session by providing Min A verbal cues for problem solving during tray set-up. Patient self-fed his breakfast meal with overall Mod I and demonstrated sustained attention to task for ~30 minutes. Although patient was disoriented to place and situation, he did demonstrate increased awareness by reporting, "I had a rough 24 hours." Patient required overall Min A for word-finding and to express wants/needs during an informal conversation. Overall, it appears patient is close to his functional level prior to yesterday. Patient left upright in bed with alarm on and all needs within reach. Continue with current plan of care.      Function:   Eating Eating   Modified Consistency Diet: No Eating Assist Level: Set up assist for;More than reasonable amount of time;Supervision or verbal cues   Eating Set Up Assist For: Opening containers       Cognition Comprehension Comprehension assist level: Understands basic 75 - 89% of the time/ requires cueing 10 - 24% of the time  Expression   Expression assist level: Expresses basic 75 - 89% of the  time/requires cueing 10 - 24% of the time. Needs helper to occlude trach/needs to repeat words.  Social Interaction Social Interaction assist level: Interacts appropriately  75 - 89% of the time - Needs redirection for appropriate language or to initiate interaction.  Problem Solving Problem solving assist level: Solves basic 75 - 89% of the time/requires cueing 10 - 24% of the time  Memory Memory assist level: Recognizes or recalls 50 - 74% of the time/requires cueing 25 - 49% of the time   Pain Pain Assessment Faces Pain Scale: No hurt  Therapy/Group: Individual Therapy  Andora Krull 03/29/2018, 11:30 AM

## 2018-03-29 NOTE — Progress Notes (Signed)
Patient has rested quietly throughout the night. No signs of distress or agitation. Nursing will continue to monitor.

## 2018-03-29 NOTE — Progress Notes (Signed)
Social Work Patient ID: [name], male   DOB: Nov 26, 1927, 84 y.o.   MRN: [id number]  Spoke with Debbie-daughter to inform tentative discharge Monday as long as medically stable. MD had spoken with her this am and she is aware. Will plan to call Monday am to confirm this plan. Pt doing better and

## 2018-03-29 NOTE — Telephone Encounter (Signed)
Patient still admitted DC postponed.

## 2018-03-29 NOTE — Procedures (Signed)
Date of recording 03/28/2018  Referring physician Love  Technical Digital EEG recording using 10-20 international electrode system  Reason for the study Altered mental status  Description of the recording When awake posterior dominant rhythm is 7-8 bilateral reactive,  Sleep architecture was not seen. Epileptiform features were not seen during this recording.  Impression The EEG is abnormal and findings are consistent with very mild generalized cerebral dysfunction  Epileptiform features were not seen during this recording.

## 2018-03-29 NOTE — Progress Notes (Addendum)
Brief History: Kyle Richardson is an 84 y.o. male admitted to the physical medicine and rehab service after sustaining a left PCA ischemic infarction, which was first evaluated at [facility] in May 2019 felt to be related to atrial fibrillation and embolism. Of note, he has a history of myasthenia gravis with intermittent diplopia, treated with Mestinon; this has not been an active problem this admission. Neuropsych evaluation was performed on 6/5, which revealed no definite psychiatric morbidity other than the cognitive and speech deficits resulting from his stroke. Neuro consulted for speech changes, AMS and confusion/agitation.  Interval Hx: Pt remains intermittently confused. MRI shows subacute stroke changes. EEG is non-specific. His therapist feels the patient has improved and is no longer agitated or hallucinating  ROS: unable d/t confusion Allergies Allergies  Allergen Reactions  . Clarithromycin Other (See Comments) and Diarrhea  . Codeine   . Flomax [Tamsulosin Hcl]   . Lamisil [Terbinafine]   . Levofloxacin Other (See Comments)    Diplopia. NEVER put patient on levoquin!  . Sertraline Other (See Comments)  . Sulfa Antibiotics     Home Medications Medications Prior to Admission  Medication Sig Dispense Refill  . ACCU-CHEK SOFTCLIX LANCETS lancets USE AS DIRECTED 100 each 1  . apixaban (ELIQUIS) 5 MG TABS tablet Take 5 mg by mouth 2 (two) times daily.     Marland Kitchen atorvastatin (LIPITOR) 20 MG tablet TAKE 1 TABLET BY MOUTH EVERY DAY 30 tablet 3  . Calcium Citrate-Vitamin D 315-250 MG-UNIT TABS Take 2 tablets by mouth daily.    Marland Kitchen FLUoxetine (PROZAC) 10 MG capsule TAKE 1 CAPSULE BY MOUTH EVERY DAY 30 capsule 3  . fluticasone (FLONASE) 50 MCG/ACT nasal spray SPRAY 2 SPRAYS IN EACH NOSTRIL ONCE A DAY 16 g 2  . latanoprost (XALATAN) 0.005 % ophthalmic solution Place 1 drop into both eyes at bedtime.    Marland Kitchen loratadine (CLARITIN) 10 MG tablet Take 10 mg by mouth daily.    . metFORMIN  (GLUCOPHAGE-XR) 500 MG 24 hr tablet Take 1 tablet (500 mg total) by mouth daily with breakfast. 30 tablet 1  . metoprolol succinate (TOPROL-XL) 25 MG 24 hr tablet Take 25 mg by mouth daily. Take with or immediately following a meal.     . Multiple Vitamin (MULTIVITAMIN) tablet Take 1 tablet by mouth daily.    . Multiple Vitamins-Minerals (PRESERVISION AREDS 2) CAPS Take 1 tablet by mouth 2 (two) times daily.     . nitroGLYCERIN (NITROSTAT) 0.4 MG SL tablet Place 0.4 mg under the tongue every 5 (five) minutes as needed. As directed    . ONE TOUCH ULTRA TEST test strip CHECK BLOOD SUGAR TWICE A DAY 180 each 3  . pyridostigmine (MESTINON) 60 MG tablet Take 1 tablet (60 mg total) by mouth 2 (two) times daily. 30 tablet 0    Hospital Medications . acetaminophen  500 mg Oral TID WC & HS  . amLODipine  5 mg Oral Daily  . apixaban  5 mg Oral BID  . atorvastatin  40 mg Oral Daily  . calcium-vitamin D  1 tablet Oral Daily  . feeding supplement (PRO-STAT SUGAR FREE 64)  30 mL Oral BID  . FLUoxetine  10 mg Oral Daily  . fluticasone  1 spray Each Nare Daily  . insulin aspart  0-5 Units Subcutaneous QHS  . insulin aspart  0-9 Units Subcutaneous TID WC  . latanoprost  1 drop Both Eyes QHS  . loratadine  10 mg Oral Daily  . mouth rinse  15 mL Mouth Rinse BID  . metoprolol succinate  25 mg Oral Daily  . multivitamin  1 tablet Oral BID  . multivitamin with minerals  1 tablet Oral Daily  . protein supplement shake  11 oz Oral TID WC & HS     Objective  Intake/Output from previous day: 06/20 0701 - 06/21 0700 In: 279.9 [P.O.:180; I.V.:99.9] Out: -  Intake/Output this shift: Total I/O In: 360 [P.O.:360] Out: 200 [Urine:200] Nutritional status:  Diet Order           Diet heart healthy/carb modified Room service appropriate? Yes; Fluid consistency: Thin  Diet effective now           Physical Exam -  Vitals:   03/28/18 1814 03/28/18 2005 03/29/18 0441 03/29/18 1359  BP: 139/66 (!) 146/69  (!) 145/58 (!) 123/54  Pulse: (!) 57 (!) 53 60 (!) 58  Resp: 16 18 20 17   Temp: 98.2 F (36.8 C) 98 F (36.7 C) 99 F (37.2 C) (!) 97.5 F (36.4 C)  TempSrc: Oral Oral Oral Oral  SpO2: 94% 96% 95% 99%  Weight:      Height:       General - elderly Caucasian male in mild distress, confused Heart - Regular rate and rhythm - no murmer Lungs - Clear to auscultation Abdomen - Soft - non tender Extremities - Distal pulses intact - no edema Skin - Warm and dry  Neurologic Exam:  Mental Status:  Alert, orienteds to self and knows he is in rehab, but cannot give any other details. Recall 0/3. Able to name only 8 animals with for legs. Diminished attention, registration poor insight into his illness.Tthought content is not appropriate. Speech without evidence of dysarthria or aphasia.Has difficulty following commands without difficulty.  Cranial Nerves:  II-dense right homonymous hemianopsia and does not blink to threat on the right but does so on the left.t, tracks. III/IV/VI-Pupils were equal and reacted. Extraocular movements were full.  V/VII-no facial numbness and no facial weakness.  VIII-hearing normal.  X-normal speech and symmetrical palatal movement.  XII-midline tongue extension  Motor: Right : Upper extremity   4/5    Left:     Upper extremity   5/5  Rt Lower extremity   4/5     Lt Lower extremity   5/5 Tone is increased in right. Sensory: decreased on right Plantars: mute Cerebellar: dysmetria on right FTN. Limited exam d/t inability to follow all commands Gait: not tested    LABORATORY RESULTS:  Basic Metabolic Panel: Recent Labs  Lab 03/25/18 0530 03/28/18 0917  NA 138 141  K 3.9 4.1  CL 106 107  CO2 26 27  GLUCOSE 130* 132*  BUN 29* 13  CREATININE 0.91 0.89  CALCIUM 8.6* 9.1    Liver Function Tests: No results for input(s): AST, ALT, ALKPHOS, BILITOT, PROT, ALBUMIN in the last 168 hours. No results for input(s): LIPASE, AMYLASE in the last 168  hours. No results for input(s): AMMONIA in the last 168 hours.  CBC: Recent Labs  Lab 03/25/18 0530 03/28/18 0917 03/29/18 1119  WBC 5.2 6.2 6.5  NEUTROABS  --  3.4  --   HGB 13.8 14.8 15.1  HCT 42.6 45.4 46.8  MCV 95.7 96.8 96.7  PLT 173 186 224    Cardiac Enzymes: No results for input(s): CKTOTAL, CKMB, CKMBINDEX, TROPONINI in the last 168 hours.  Lipid Panel: No results for input(s): CHOL, TRIG, HDL, CHOLHDL, VLDL, LDLCALC in the last 168 hours.  CBG:  Recent Labs  Lab 03/28/18 1151 03/28/18 1812 03/28/18 2130 03/29/18 0630 03/29/18 1128  GLUCAP 168* 137* 142* 104* 123*    Microbiology:   Coagulation Studies: No results for input(s): LABPROT, INR in the last 72 hours.  Miscellaneous Labs:   IMAGING RESULTS Dg Chest 2 View  Result Date: 03/29/2018 CLINICAL DATA:  Pneumonia. EXAM: CHEST - 2 VIEW COMPARISON:  03/28/2018 FINDINGS: Sequelae of prior CABG are again identified. The cardiomediastinal silhouette is unchanged with normal heart size. The lungs are hypoinflated with minimal bibasilar atelectasis, improved from prior. No pleural effusion or pneumothorax is identified. No acute osseous abnormality is seen. IMPRESSION: Hypoinflation with minimal bibasilar atelectasis. Electronically Signed   By: Logan Bores M.D.   On: 03/29/2018 14:41   Dg Chest 2 View  Result Date: 03/28/2018 CLINICAL DATA:  Confusion.  Disorientation. EXAM: CHEST - 2 VIEW COMPARISON:  03/01/2018. FINDINGS: Prior CABG. Heart size normal. Normal pulmonary vascularity. Low lung volumes with mild bibasilar atelectasis/infiltrates. No pleural effusion or pneumothorax. IMPRESSION: 1.  Prior CABG.  Heart size normal. 2.  Low lung volumes with mild bibasilar atelectasis/infiltrates. Electronically Signed   By: Marcello Moores  Register   On: 03/28/2018 13:11   Ct Head Wo Contrast  Result Date: 03/28/2018 CLINICAL DATA:  Stroke.  Confusion EXAM: CT HEAD WITHOUT CONTRAST TECHNIQUE: Contiguous axial images  were obtained from the base of the skull through the vertex without intravenous contrast. COMPARISON:  MRI 02/23/2018, CT 02/22/2018 FINDINGS: Brain: Acute infarct left PCA territory. Hypodensity in the left medial temporal lobe extending into the occipital lobe. Left thalamus involved. There has been progression of infarct since the prior MRI which has extended to the occipital lobe. Small area of petechial hemorrhage has developed in the left occipital lobe measuring less than 1 cm Generalized atrophy. Mild chronic microvascular ischemic change in the white matter. Negative for mass lesion. Vascular: Negative for hyperdense vessel Skull: Negative Sinuses/Orbits: Mucosal edema paranasal sinuses. Bilateral cataract surgery Other: None IMPRESSION: Acute infarct left posterior cerebral artery territory has extended into the left occipital lobe since the prior MRI of 02/23/2018. Small subcentimeter area of petechial hemorrhage has developed in the left occipital infarct These results will be called to the ordering clinician or representative by the Radiologist Assistant, and communication documented in the PACS or zVision Dashboard. Electronically Signed   By: Franchot Gallo M.D.   On: 03/28/2018 13:08   Mr Brain Wo Contrast  Result Date: 03/28/2018 CLINICAL DATA:  Confusion, altered mental status. History of migraines, hypertension, hypercholesterolemia, diabetes, stroke. EXAM: MRI HEAD WITHOUT CONTRAST TECHNIQUE: Multiplanar, multiecho pulse sequences of the brain and surrounding structures were obtained without intravenous contrast. COMPARISON:  CT HEAD March 28, 2018 and MRI of the head Feb 23, 2018 FINDINGS: Moderately motion degraded examination. INTRACRANIAL CONTENTS: Reduced diffusion LEFT mesial temporal occipital lobes, limited assessment on ADC maps due to motion without convincing low ADC values. New patchy susceptibility artifact LEFT occipital lobe. A few scattered chronic microhemorrhages. No  parenchymal brain volume loss for age. No hydrocephalus. Patchy supratentorial white matter FLAIR T2 hyperintensities compatible with mild chronic small vessel ischemic changes. Old LEFT basal ganglia and thalamus infarcts. Prominent basal ganglia T2 hyperintensities seen with chronic small vessel ischemic changes. No abnormal extra-axial fluid collections. VASCULAR: Normal major intracranial vascular flow voids present at skull base. SKULL AND UPPER CERVICAL SPINE: No abnormal sellar expansion. No suspicious calvarial bone marrow signal. Craniocervical junction maintained. SINUSES/ORBITS: Mild paranasal sinus mucosal thickening without Air-fluid levels. Mastoid air cells are  well aerated. Status post bilateral ocular lens implants.The included ocular globes and orbital contents are non-suspicious. OTHER: None. IMPRESSION: 1. Motion degraded examination. 2. Further propagation LEFT PCA territory infarct is likely subacute with petechial hemorrhage. 3. Mild chronic small vessel ischemic changes. Old LEFT basal ganglia and thalamus small infarcts. Electronically Signed   By: Elon Alas M.D.   On: 03/28/2018 18:34     EEG: Epileptiform features were not seen during this recording.The EEG is abnormal and findings are consistent with very mild generalized cerebral dysfunction. This is very non-specific and doesn't r/o seizures.   Assessment/Plan: This is 84yr old man in rehab post stroke now with transient speech and confusion. 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 MRI reviewed, it shows no definite new infarct. EEG non specific, but cannot r/o seizures. He has high risk for post stroke seizures and with underlying dementia this also puts him at risk. DDX: Delirium, acutely decompensated with likely underlying non-diagnosed dementia.   PLAN/RECS: Limit  pain meds or sedation meds With another spell today (but no agitation, just speech and AMS) there is strong suspicion for seizures Cautiously start Depakote 500 ER mg daily QHS  For agitation if needed as no clear evidence of seizures on EEG noted.I have d/w Pam, PA to monitor for increased lethargy with this med If he has further delirium or agitation, favor atypical antipsychotics such as Seroquel 25mg  over benzo's. He will need out pt follow up with Dr Starlyn Skeans Metzger-Cihelka, ARNP-C [facility]  I have personally examined this patient, reviewed notes, independently viewed imaging studies, participated in medical decision making and plan of care.ROS completed by me personally and pertinent positives fully documented  I have made any additions or clarifications directly to the above note. 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 If agitation recurs.Depakote ER 500 mg daily. Discussed with rehabilitation team nurse practitioner  Olin Hauser.greater than 50% time during this 35 minute visit was spent on counseling and coordination of care about his behavioral agitation episodes and answered questions  Antony Contras, MD Medical Director [place] Pager: [phone] 03/29/2018 3:44 PM

## 2018-03-29 NOTE — Progress Notes (Signed)
Subjective/Complaints:  Appreciate Neuro consult.  Pt awake and alert eating breakfast without problems, "I had a tough 24 hours" Following command, SLP notes language now near baseline  ROS: Limited due to cognitive/behavioral    Objective: Vital Signs: Blood pressure (!) 145/58, pulse 60, temperature 99 F (37.2 C), temperature source Oral, resp. rate 20, height '5\' 8"'  (1.727 m), weight 73 kg (160 lb 15 oz), SpO2 95 %. Dg Chest 2 View  Result Date: 03/28/2018 CLINICAL DATA:  Confusion.  Disorientation. EXAM: CHEST - 2 VIEW COMPARISON:  03/01/2018. FINDINGS: Prior CABG. Heart size normal. Normal pulmonary vascularity. Low lung volumes with mild bibasilar atelectasis/infiltrates. No pleural effusion or pneumothorax. IMPRESSION: 1.  Prior CABG.  Heart size normal. 2.  Low lung volumes with mild bibasilar atelectasis/infiltrates. Electronically Signed   By: Marcello Moores  Register   On: 03/28/2018 13:11   Ct Head Wo Contrast  Result Date: 03/28/2018 CLINICAL DATA:  Stroke.  Confusion EXAM: CT HEAD WITHOUT CONTRAST TECHNIQUE: Contiguous axial images were obtained from the base of the skull through the vertex without intravenous contrast. COMPARISON:  MRI 02/23/2018, CT 02/22/2018 FINDINGS: Brain: Acute infarct left PCA territory. Hypodensity in the left medial temporal lobe extending into the occipital lobe. Left thalamus involved. There has been progression of infarct since the prior MRI which has extended to the occipital lobe. Small area of petechial hemorrhage has developed in the left occipital lobe measuring less than 1 cm Generalized atrophy. Mild chronic microvascular ischemic change in the white matter. Negative for mass lesion. Vascular: Negative for hyperdense vessel Skull: Negative Sinuses/Orbits: Mucosal edema paranasal sinuses. Bilateral cataract surgery Other: None IMPRESSION: Acute infarct left posterior cerebral artery territory has extended into the left occipital lobe since the prior MRI  of 02/23/2018. Small subcentimeter area of petechial hemorrhage has developed in the left occipital infarct These results will be called to the ordering clinician or representative by the Radiologist Assistant, and communication documented in the PACS or zVision Dashboard. Electronically Signed   By: Franchot Gallo M.D.   On: 03/28/2018 13:08   Mr Brain Wo Contrast  Result Date: 03/28/2018 CLINICAL DATA:  Confusion, altered mental status. History of migraines, hypertension, hypercholesterolemia, diabetes, stroke. EXAM: MRI HEAD WITHOUT CONTRAST TECHNIQUE: Multiplanar, multiecho pulse sequences of the brain and surrounding structures were obtained without intravenous contrast. COMPARISON:  CT HEAD March 28, 2018 and MRI of the head Feb 23, 2018 FINDINGS: Moderately motion degraded examination. INTRACRANIAL CONTENTS: Reduced diffusion LEFT mesial temporal occipital lobes, limited assessment on ADC maps due to motion without convincing low ADC values. New patchy susceptibility artifact LEFT occipital lobe. A few scattered chronic microhemorrhages. No parenchymal brain volume loss for age. No hydrocephalus. Patchy supratentorial white matter FLAIR T2 hyperintensities compatible with mild chronic small vessel ischemic changes. Old LEFT basal ganglia and thalamus infarcts. Prominent basal ganglia T2 hyperintensities seen with chronic small vessel ischemic changes. No abnormal extra-axial fluid collections. VASCULAR: Normal major intracranial vascular flow voids present at skull base. SKULL AND UPPER CERVICAL SPINE: No abnormal sellar expansion. No suspicious calvarial bone marrow signal. Craniocervical junction maintained. SINUSES/ORBITS: Mild paranasal sinus mucosal thickening without Air-fluid levels. Mastoid air cells are well aerated. Status post bilateral ocular lens implants.The included ocular globes and orbital contents are non-suspicious. OTHER: None. IMPRESSION: 1. Motion degraded examination. 2. Further  propagation LEFT PCA territory infarct is likely subacute with petechial hemorrhage. 3. Mild chronic small vessel ischemic changes. Old LEFT basal ganglia and thalamus small infarcts. Electronically Signed   By: Sandie Ano  Bloomer M.D.   On: 03/28/2018 18:34   Results for orders placed or performed during the hospital encounter of 02/27/18 (from the past 72 hour(s))  Glucose, capillary     Status: Abnormal   Collection Time: 03/26/18 12:03 PM  Result Value Ref Range   Glucose-Capillary 129 (H) 65 - 99 mg/dL  Glucose, capillary     Status: Abnormal   Collection Time: 03/26/18  4:24 PM  Result Value Ref Range   Glucose-Capillary 140 (H) 65 - 99 mg/dL  Glucose, capillary     Status: Abnormal   Collection Time: 03/26/18  9:00 PM  Result Value Ref Range   Glucose-Capillary 172 (H) 65 - 99 mg/dL  Glucose, capillary     Status: Abnormal   Collection Time: 03/27/18  6:32 AM  Result Value Ref Range   Glucose-Capillary 122 (H) 65 - 99 mg/dL  Glucose, capillary     Status: Abnormal   Collection Time: 03/27/18 11:58 AM  Result Value Ref Range   Glucose-Capillary 137 (H) 65 - 99 mg/dL  Glucose, capillary     Status: Abnormal   Collection Time: 03/27/18  4:35 PM  Result Value Ref Range   Glucose-Capillary 114 (H) 65 - 99 mg/dL  Glucose, capillary     Status: Abnormal   Collection Time: 03/27/18  9:10 PM  Result Value Ref Range   Glucose-Capillary 159 (H) 65 - 99 mg/dL  Urinalysis, Complete w Microscopic     Status: Abnormal   Collection Time: 03/27/18 10:47 PM  Result Value Ref Range   Color, Urine STRAW (A) YELLOW   APPearance CLEAR CLEAR   Specific Gravity, Urine 1.011 1.005 - 1.030   pH 7.0 5.0 - 8.0   Glucose, UA NEGATIVE NEGATIVE mg/dL   Hgb urine dipstick NEGATIVE NEGATIVE   Bilirubin Urine NEGATIVE NEGATIVE   Ketones, ur NEGATIVE NEGATIVE mg/dL   Protein, ur NEGATIVE NEGATIVE mg/dL   Nitrite NEGATIVE NEGATIVE   Leukocytes, UA TRACE (A) NEGATIVE   RBC / HPF 0-5 0 - 5 RBC/hpf    WBC, UA 0-5 0 - 5 WBC/hpf   Bacteria, UA NONE SEEN NONE SEEN   Squamous Epithelial / LPF 0-5 0 - 5    Comment: Performed at [facility], [address] [place], [place] [address]  Glucose, capillary     Status: Abnormal   Collection Time: 03/28/18  6:22 AM  Result Value Ref Range   Glucose-Capillary 128 (H) 65 - 99 mg/dL  CBC with Differential/Platelet     Status: None   Collection Time: 03/28/18  9:17 AM  Result Value Ref Range   WBC 6.2 4.0 - 10.5 K/uL   RBC 4.69 4.22 - 5.81 MIL/uL   Hemoglobin 14.8 13.0 - 17.0 g/dL   HCT 45.4 39.0 - 52.0 %   MCV 96.8 78.0 - 100.0 fL   MCH 31.6 26.0 - 34.0 pg   MCHC 32.6 30.0 - 36.0 g/dL   RDW 13.2 11.5 - 15.5 %   Platelets 186 150 - 400 K/uL   Neutrophils Relative % 54 %   Neutro Abs 3.4 1.7 - 7.7 K/uL   Lymphocytes Relative 29 %   Lymphs Abs 1.8 0.7 - 4.0 K/uL   Monocytes Relative 12 %   Monocytes Absolute 0.7 0.1 - 1.0 K/uL   Eosinophils Relative 5 %   Eosinophils Absolute 0.3 0.0 - 0.7 K/uL   Basophils Relative 1 %   Basophils Absolute 0.0 0.0 - 0.1 K/uL   Immature Granulocytes 1 %  Abs Immature Granulocytes 0.0 0.0 - 0.1 K/uL    Comment: Performed at [facility], [place] [address], [place], [place] [address]  Basic metabolic panel     Status: Abnormal   Collection Time: 03/28/18  9:17 AM  Result Value Ref Range   Sodium 141 135 - 145 mmol/L   Potassium 4.1 3.5 - 5.1 mmol/L   Chloride 107 101 - 111 mmol/L   CO2 27 22 - 32 mmol/L   Glucose, Bld 132 (H) 65 - 99 mg/dL   BUN 13 6 - 20 mg/dL   Creatinine, Ser 0.89 0.61 - 1.24 mg/dL   Calcium 9.1 8.9 - 10.3 mg/dL   GFR calc non Af Amer >60 >60 mL/min   GFR calc Af Amer >60 >60 mL/min    Comment: (NOTE) The eGFR has been calculated using the CKD EPI equation. This calculation has not been validated in all clinical situations. eGFR's persistently <60 mL/min signify possible Chronic Kidney Disease.    Anion gap 7 5 - 15    Comment: Performed at [facility] [address] [place], [place] [address]  Glucose, capillary     Status: Abnormal   Collection Time: 03/28/18 11:51 AM  Result Value Ref Range   Glucose-Capillary 168 (H) 65 - 99 mg/dL  Glucose, capillary     Status: Abnormal   Collection Time: 03/28/18  6:12 PM  Result Value Ref Range   Glucose-Capillary 137 (H) 65 - 99 mg/dL  Glucose, capillary     Status: Abnormal   Collection Time: 03/28/18  9:30 PM  Result Value Ref Range   Glucose-Capillary 142 (H) 65 - 99 mg/dL     Constitutional: No distress . Vital signs reviewed. HEENT: EOMI, oral membranes moist Neck: supple Cardiovascular: RRR without murmur. No JVD    Respiratory: CTA Bilaterally without wheezes or rales. Normal effort    GI: BS +, non-tender, non-distended  Musc: No edema or tenderness in extremities. Neuro: Alert.  Word finding deficits Motor:follows commands with aid of gestural cues RUE: 4- RIght delt bi tri grip RLE: Hip flexion, knee extension, ADF 4/5 LLE: 5/5 proximal to distal, LUE 5/5  .   Assessment/Plan: 1. Functional deficits secondary to left PCA infarct with right hemiparesis, receptive aphasia which require 3+ hours per day of interdisciplinary therapy in a comprehensive inpatient rehab setting. Physiatrist is providing close team supervision and 24 hour management of active medical problems listed below. Physiatrist and rehab team continue to assess barriers to discharge/monitor patient progress toward functional and medical goals. FIM: Function - Bathing Bathing activity did not occur: N/A Position: Shower Body parts bathed by patient: Right arm, Left arm, Chest, Abdomen, Front perineal area, Right upper leg, Buttocks, Right lower leg, Left upper leg, Left lower leg Body parts bathed by helper: Back Assist Level: Touching or steadying assistance(Pt > 75%)  Function- Upper Body Dressing/Undressing Upper body dressing/undressing activity did not occur: N/A What is the patient wearing?: Pull over  shirt/dress Pull over shirt/dress - Perfomed by patient: Thread/unthread right sleeve, Thread/unthread left sleeve, Put head through opening, Pull shirt over trunk Pull over shirt/dress - Perfomed by helper: Thread/unthread right sleeve Assist Level: Touching or steadying assistance(Pt > 75%) Function - Lower Body Dressing/Undressing Lower body dressing/undressing activity did not occur: N/A What is the patient wearing?: Pants, Socks, Shoes Position: Wheelchair/chair at sink Underwear - Performed by helper: Thread/unthread left underwear leg, Pull underwear up/down Pants- Performed by patient: Thread/unthread right pants leg, Thread/unthread left  pants leg, Pull pants up/down Pants- Performed by helper: Thread/unthread right pants leg, Pull pants up/down, Thread/unthread left pants leg Non-skid slipper socks- Performed by helper: Don/doff right sock, Don/doff left sock Socks - Performed by patient: Don/doff left sock, Don/doff right sock Socks - Performed by helper: Don/doff right sock Shoes - Performed by patient: Don/doff left shoe, Don/doff right shoe, Fasten left, Fasten right Shoes - Performed by helper: Fasten left, Fasten right Assist for footwear: Supervision/touching assist Assist for lower body dressing: Touching or steadying assistance (Pt > 75%), Supervision or verbal cues  Function - Toileting Toileting activity did not occur: N/A Toileting steps completed by patient: Adjust clothing prior to toileting, Adjust clothing after toileting Toileting steps completed by helper: Performs perineal hygiene Toileting Assistive Devices: Grab bar or rail Assist level: Touching or steadying assistance (Pt.75%)  Function - Air cabin crew transfer activity did not occur: N/A(this did not occur with me today) Toilet transfer assistive device: Mechanical lift Mechanical lift: Stedy Assist level to toilet: Touching or steadying assistance (Pt > 75%) Assist level from toilet:  Touching or steadying assistance (Pt > 75%)  Function - Chair/bed transfer Chair/bed transfer activity did not occur: N/A(this did not occur with me today) Chair/bed transfer method: Stand pivot Chair/bed transfer assist level: Touching or steadying assistance (Pt > 75%) Chair/bed transfer assistive device: Armrests Mechanical lift: Stedy Chair/bed transfer details: Tactile cues for placement, Tactile cues for weight shifting, Manual facilitation for weight shifting, Manual facilitation for placement, Manual facilitation for weight bearing  Function - Locomotion: Wheelchair Will patient use wheelchair at discharge?: Yes Type: Manual Wheelchair activity did not occur: Safety/medical concerns Max wheelchair distance: 70 Assist Level: Touching or steadying assistance (Pt > 75%) Wheel 50 feet with 2 turns activity did not occur: Safety/medical concerns Assist Level: Touching or steadying assistance (Pt > 75%) Wheel 150 feet activity did not occur: Safety/medical concerns Assist Level: Touching or steadying assistance (Pt > 75%) Turns around,maneuvers to table,bed, and toilet,negotiates 3% grade,maneuvers on rugs and over doorsills: No Function - Locomotion: Ambulation Ambulation activity did not occur: N/A Assistive device: Walker-rolling, Orthosis Max distance: 30 ft Assist level: Moderate assist (Pt 50 - 74%) Walk 10 feet activity did not occur: Safety/medical concerns Assist level: Moderate assist (Pt 50 - 74%) Walk 50 feet with 2 turns activity did not occur: Safety/medical concerns Assist level: Moderate assist (Pt 50 - 74%) Walk 150 feet activity did not occur: Safety/medical concerns Walk 10 feet on uneven surfaces activity did not occur: Safety/medical concerns  Function - Comprehension Comprehension: Auditory Comprehension assistive device: Hearing aids Comprehension assist level: Understands basic 50 - 74% of the time/ requires cueing 25 - 49% of the time  Function -  Expression Expression: Verbal Expression assist level: Expresses basic 50 - 74% of the time/requires cueing 25 - 49% of the time. Needs to repeat parts of sentences.  Function - Social Interaction Social Interaction assist level: Interacts appropriately 50 - 74% of the time - May be physically or verbally inappropriate.  Function - Problem Solving Problem solving assist level: Solves basic 25 - 49% of the time - needs direction more than half the time to initiate, plan or complete simple activities  Function - Memory Memory assist level: Recognizes or recalls less than 25% of the time/requires cueing greater than 75% of the time Patient normally able to recall (first 3 days only): That he or she is in a hospital   Medical Problem List and Plan: 1.  Right hemiparesis and  global aphasia secondary to embolic left PCA infarct.  Altered mental status but looks back at baseline- repeat CT showed extension of PCA infarct to occipital lobe, per neuro no appreciable change from prior .  MRI motion degraded, but DWI not convincing for new inarct and in fact extension may be subacute/ Vascular Neuro f/u today- consideration for seizure with post ictal state, EEG pending  ? Need for antiepileptic meds.  WIll hold D/C , plan for Monday if stable 2.  DVT Prophylaxis/Anticoagulation:  Eliquis  will serve as DVT prophylaxis as well- per neuro no need to change anticoag choice 3. Pain Management: tylenol prn.  4. Mood: Team to provide ego support to help with frustration.  LCSW to follow for evaluation and support when appropriate.  On Prozac-neuropsych f/u 5. Neuropsych: This patient is not capable of making decisions on his own behalf. 6. Skin/Wound Care: routine pressure relief measures.  7. Fluids/Electrolytes/Nutrition: Monitor I/Os. Added full supervision at meals for safety., BMET normal 6/20 despite AMS intake still 70-90%    8. PAF/CAD/HTN: Monitor HR bid. ON metoprolol and Lipitor.  Vitals:    03/28/18 2005 03/29/18 0441  BP: (!) 146/69 (!) 145/58  Pulse: (!) 53 60  Resp: 18 20  Temp: 98 F (36.7 C) 99 F (37.2 C)  SpO2: 96% 95%   low dose amlodipine started 5/28 Good  control 6/18, asymptomatic brady due to BB 9. T2DM: Hgb A1C- 7.1.  Monitor BS ac/hs and use SSI for elevated BS. Metformin d/ced due to GI Side effects CBG (last 3)  Recent Labs    03/28/18 1151 03/28/18 1812 03/28/18 2130  GLUCAP 168* 137* 142*   Good  control 6/18 continue SSI 10. Myasthenia Gravis with intermittent diplopia: On mestonin 30 mg bid--to be increased to 60 mg bid per discharge summary, daughter doesn't want him to have this, had EMG confirmation. Monitor for fatiguability, ocular symptoms--- but appears to be tolerating therapy thus far,   -Consider revisiting treatments with daughter if further symptoms arise as pt cannot make a decision, pt also cannot provide subjective symptoms 11. Glaucoma: Continue Ocuvite bid and  Xalatan at bedtime.  12.  Hypoalb- prostat 13.  Insomnia- sleep graph  LOS (Days) 30 A FACE TO FACE EVALUATION WAS PERFORMED  Charlett Blake 03/29/2018, 6:24 AM

## 2018-03-29 NOTE — Plan of Care (Signed)
  Problem: Consults Goal: RH STROKE PATIENT EDUCATION Description See Patient Education module for education specifics  Outcome: Progressing   Problem: RH BOWEL ELIMINATION Goal: RH STG MANAGE BOWEL WITH ASSISTANCE Description STG Manage Bowel with mod Assistance.  Outcome: Progressing Goal: RH STG MANAGE BOWEL W/MEDICATION W/ASSISTANCE Description STG Manage Bowel with Medication with mod Assistance.  Outcome: Progressing   Problem: RH BLADDER ELIMINATION Goal: RH STG MANAGE BLADDER WITH ASSISTANCE Description STG Manage Bladder With mod Assistance  Outcome: Progressing   Problem: RH SKIN INTEGRITY Goal: RH STG SKIN FREE OF INFECTION/BREAKDOWN Description Pt will remain free from skin infection/ breakdown with mod assistance.  Outcome: Progressing Goal: RH STG MAINTAIN SKIN INTEGRITY WITH ASSISTANCE Description STG Maintain Skin Integrity With mod Assistance.  Outcome: Progressing   Problem: RH SAFETY Goal: RH STG ADHERE TO SAFETY PRECAUTIONS W/ASSISTANCE/DEVICE Description STG Adhere to Safety Precautions With mod  Assistance/Device.  Outcome: Progressing   Problem: RH COGNITION-NURSING Goal: RH STG USES MEMORY AIDS/STRATEGIES W/ASSIST TO PROBLEM SOLVE Description STG Uses Memory Aids/Strategies With mod Assistance to Problem Solve.  Outcome: Progressing Goal: RH STG ANTICIPATES NEEDS/CALLS FOR ASSIST W/ASSIST/CUES Description STG Anticipates Needs/Calls for Assist With mod Assistance/Cues.  Outcome: Progressing   Problem: RH KNOWLEDGE DEFICIT Goal: RH STG INCREASE KNOWLEDGE OF DIABETES Description Mod assist  Outcome: Progressing Goal: RH STG INCREASE KNOWLEDGE OF HYPERTENSION Description Mod assist  Outcome: Progressing

## 2018-03-29 NOTE — Progress Notes (Signed)
Occupational Therapy Session Note  Patient Details  Name: Kyle Richardson MRN: [id number] Date of Birth: June 27, 1928  Today's Date: 03/29/2018 OT Individual Time: 1440-1535 OT Individual Time Calculation (min): 55 min   Short Term Goals: Week 4:  OT Short Term Goal 1 (Week 4): STG=LTG 2/2 ELOS  Skilled Therapeutic Interventions/Progress Updates:    Pt greeted semi-reclined in bed and agreeable to OT. Pt stated " I don't know what's been wrong with me." Pt disoriented to place and situation which is not unusual for patient. He followed commands to come to sitting EOB with supervision. Stand-pivot to wc on L side with min A and multimodal cues to initiate transfers. Pt incontinent of urine in brief. Dressing and brief change completed from wc at the sink. Pt completed multiple sit<>stands with CGA/supervision and verbal cues for hand placement. He was able to maintain standing balance when reaching to wash buttocks with CGA and 1 episode of posterior LOB requiring min A to correct. Pt also able to thread BLEs into pants and pull pants up with CGA for balance in standing. Pt able to complete 4/4 steps of donning shirt with supervision and verbal cues for sequence and technique. Pt brought to dayroom and stood at raised table for therapeutic activity focus on color sorting and R attention. Pt able to correctly identify 1/6 colored bean bags. Tried reviewing colors prior to 2nd trial, but pt still only successful with 1 color (blue). Pt multimodal cues to locate bin on R side to place bean bags in. Upon returning to room, pt tearful stating he did not understand why that was so difficult. Explained to pt his stroke and reoriented him to place time and situation. Pt left seated in wc with safety belt, chair alarm, and family present.   Therapy Documentation Precautions:  Precautions Precautions: Fall Precaution Comments: R hemiparesis, ataxia Restrictions Weight Bearing Restrictions: No Pain: Pain  Assessment Faces Pain Scale: No hurt ADL: ADL ADL Comments: Please see functional navigator  See Function Navigator for Current Functional Status.   Therapy/Group: Individual Therapy  Valma Cava 03/29/2018, 3:04 PM

## 2018-03-29 NOTE — Progress Notes (Signed)
Physical Therapy Session Note  Patient Details  Name: Kyle Richardson MRN: [id number] Date of Birth: November 03, 1927  Today's Date: 03/29/2018 PT Individual Time: 8638-1771 PT Individual Time Calculation (min): 54 min   Short Term Goals: Week 4:  PT Short Term Goal 1 (Week 4): =LTGs due to ELOS  Skilled Therapeutic Interventions/Progress Updates:   Pt in supine and agreeable to therapy, denies pain and reports he feels better than yesterday, stating "I don't know what got into me". Pt A&Ox4 this session. Transferred to EOB w/ supervision and to w/c via stand pivot w/ min assist. Pt w/ improved sequencing of functional tasks today compared to yesterday. Session focused on blocked practice of sit<>stands to RW and stand pivot transfers in both directions w/ RW. Min assist overall 2/2 verbal cues required for sequencing and safety. Returned to room and ended session in w/c, call bell within reach and all needs met. Quick release belt and chair alarm engaged.   Therapy Documentation Precautions:  Precautions Precautions: Fall Precaution Comments: R hemiparesis, ataxia Restrictions Weight Bearing Restrictions: No  See Function Navigator for Current Functional Status.   Therapy/Group: Individual Therapy  Kyle Richardson 03/29/2018, 9:55 AM

## 2018-03-30 ENCOUNTER — Inpatient Hospital Stay (HOSPITAL_COMMUNITY): Payer: PPO | Admitting: Occupational Therapy

## 2018-03-30 ENCOUNTER — Inpatient Hospital Stay (HOSPITAL_COMMUNITY): Payer: PPO

## 2018-03-30 LAB — RPR: RPR Ser Ql: NONREACTIVE

## 2018-03-30 LAB — HIV ANTIBODY (ROUTINE TESTING W REFLEX): HIV SCREEN 4TH GENERATION: NONREACTIVE

## 2018-03-30 LAB — GLUCOSE, CAPILLARY
Glucose-Capillary: 105 mg/dL — ABNORMAL HIGH (ref 65–99)
Glucose-Capillary: 129 mg/dL — ABNORMAL HIGH (ref 65–99)
Glucose-Capillary: 135 mg/dL — ABNORMAL HIGH (ref 65–99)
Glucose-Capillary: 133 mg/dL — ABNORMAL HIGH (ref 65–99)

## 2018-03-30 MED ORDER — QUETIAPINE FUMARATE 25 MG PO TABS
25.0000 mg | ORAL_TABLET | Freq: Two times a day (BID) | ORAL | Status: DC | PRN
  Administered 2018-03-30 – 2018-03-31 (×3): 25 mg via ORAL
  Filled 2018-03-30 (×3): qty 1

## 2018-03-30 NOTE — Progress Notes (Signed)
Subjective/Complaints:  Remains aphasic, RIght field cut persist, less compensation noted  ROS: Limited due to cognitive/behavioral    Objective: Vital Signs: Blood pressure 138/67, pulse 60, temperature 97.8 F (36.6 C), temperature source Oral, resp. rate 17, height '5\' 8"'  (1.727 m), weight 73 kg (160 lb 15 oz), SpO2 96 %. Dg Chest 2 View  Result Date: 03/29/2018 CLINICAL DATA:  Pneumonia. EXAM: CHEST - 2 VIEW COMPARISON:  03/28/2018 FINDINGS: Sequelae of prior CABG are again identified. The cardiomediastinal silhouette is unchanged with normal heart size. The lungs are hypoinflated with minimal bibasilar atelectasis, improved from prior. No pleural effusion or pneumothorax is identified. No acute osseous abnormality is seen. IMPRESSION: Hypoinflation with minimal bibasilar atelectasis. Electronically Signed   By: Logan Bores M.D.   On: 03/29/2018 14:41   Dg Chest 2 View  Result Date: 03/28/2018 CLINICAL DATA:  Confusion.  Disorientation. EXAM: CHEST - 2 VIEW COMPARISON:  03/01/2018. FINDINGS: Prior CABG. Heart size normal. Normal pulmonary vascularity. Low lung volumes with mild bibasilar atelectasis/infiltrates. No pleural effusion or pneumothorax. IMPRESSION: 1.  Prior CABG.  Heart size normal. 2.  Low lung volumes with mild bibasilar atelectasis/infiltrates. Electronically Signed   By: Marcello Moores  Register   On: 03/28/2018 13:11   Ct Head Wo Contrast  Result Date: 03/28/2018 CLINICAL DATA:  Stroke.  Confusion EXAM: CT HEAD WITHOUT CONTRAST TECHNIQUE: Contiguous axial images were obtained from the base of the skull through the vertex without intravenous contrast. COMPARISON:  MRI 02/23/2018, CT 02/22/2018 FINDINGS: Brain: Acute infarct left PCA territory. Hypodensity in the left medial temporal lobe extending into the occipital lobe. Left thalamus involved. There has been progression of infarct since the prior MRI which has extended to the occipital lobe. Small area of petechial hemorrhage  has developed in the left occipital lobe measuring less than 1 cm Generalized atrophy. Mild chronic microvascular ischemic change in the white matter. Negative for mass lesion. Vascular: Negative for hyperdense vessel Skull: Negative Sinuses/Orbits: Mucosal edema paranasal sinuses. Bilateral cataract surgery Other: None IMPRESSION: Acute infarct left posterior cerebral artery territory has extended into the left occipital lobe since the prior MRI of 02/23/2018. Small subcentimeter area of petechial hemorrhage has developed in the left occipital infarct These results will be called to the ordering clinician or representative by the Radiologist Assistant, and communication documented in the PACS or zVision Dashboard. Electronically Signed   By: Franchot Gallo M.D.   On: 03/28/2018 13:08   Mr Brain Wo Contrast  Result Date: 03/28/2018 CLINICAL DATA:  Confusion, altered mental status. History of migraines, hypertension, hypercholesterolemia, diabetes, stroke. EXAM: MRI HEAD WITHOUT CONTRAST TECHNIQUE: Multiplanar, multiecho pulse sequences of the brain and surrounding structures were obtained without intravenous contrast. COMPARISON:  CT HEAD March 28, 2018 and MRI of the head Feb 23, 2018 FINDINGS: Moderately motion degraded examination. INTRACRANIAL CONTENTS: Reduced diffusion LEFT mesial temporal occipital lobes, limited assessment on ADC maps due to motion without convincing low ADC values. New patchy susceptibility artifact LEFT occipital lobe. A few scattered chronic microhemorrhages. No parenchymal brain volume loss for age. No hydrocephalus. Patchy supratentorial white matter FLAIR T2 hyperintensities compatible with mild chronic small vessel ischemic changes. Old LEFT basal ganglia and thalamus infarcts. Prominent basal ganglia T2 hyperintensities seen with chronic small vessel ischemic changes. No abnormal extra-axial fluid collections. VASCULAR: Normal major intracranial vascular flow voids present at  skull base. SKULL AND UPPER CERVICAL SPINE: No abnormal sellar expansion. No suspicious calvarial bone marrow signal. Craniocervical junction maintained. SINUSES/ORBITS: Mild paranasal sinus mucosal thickening  without Air-fluid levels. Mastoid air cells are well aerated. Status post bilateral ocular lens implants.The included ocular globes and orbital contents are non-suspicious. OTHER: None. IMPRESSION: 1. Motion degraded examination. 2. Further propagation LEFT PCA territory infarct is likely subacute with petechial hemorrhage. 3. Mild chronic small vessel ischemic changes. Old LEFT basal ganglia and thalamus small infarcts. Electronically Signed   By: Elon Alas M.D.   On: 03/28/2018 18:34   Results for orders placed or performed during the hospital encounter of 02/27/18 (from the past 72 hour(s))  Glucose, capillary     Status: Abnormal   Collection Time: 03/27/18 11:58 AM  Result Value Ref Range   Glucose-Capillary 137 (H) 65 - 99 mg/dL  Glucose, capillary     Status: Abnormal   Collection Time: 03/27/18  4:35 PM  Result Value Ref Range   Glucose-Capillary 114 (H) 65 - 99 mg/dL  Glucose, capillary     Status: Abnormal   Collection Time: 03/27/18  9:10 PM  Result Value Ref Range   Glucose-Capillary 159 (H) 65 - 99 mg/dL  Urinalysis, Complete w Microscopic     Status: Abnormal   Collection Time: 03/27/18 10:47 PM  Result Value Ref Range   Color, Urine STRAW (A) YELLOW   APPearance CLEAR CLEAR   Specific Gravity, Urine 1.011 1.005 - 1.030   pH 7.0 5.0 - 8.0   Glucose, UA NEGATIVE NEGATIVE mg/dL   Hgb urine dipstick NEGATIVE NEGATIVE   Bilirubin Urine NEGATIVE NEGATIVE   Ketones, ur NEGATIVE NEGATIVE mg/dL   Protein, ur NEGATIVE NEGATIVE mg/dL   Nitrite NEGATIVE NEGATIVE   Leukocytes, UA TRACE (A) NEGATIVE   RBC / HPF 0-5 0 - 5 RBC/hpf   WBC, UA 0-5 0 - 5 WBC/hpf   Bacteria, UA NONE SEEN NONE SEEN   Squamous Epithelial / LPF 0-5 0 - 5    Comment: Performed at [facility], [address], [place], [place] [address]  Culture, Urine     Status: Abnormal   Collection Time: 03/27/18 11:37 PM  Result Value Ref Range   Specimen Description URINE, CLEAN CATCH    Special Requests      NONE Performed at [facility], [place] [address], [place], [place] [address]    Culture MULTIPLE SPECIES PRESENT, SUGGEST RECOLLECTION (A)    Report Status 03/29/2018 FINAL   Glucose, capillary     Status: Abnormal   Collection Time: 03/28/18  6:22 AM  Result Value Ref Range   Glucose-Capillary 128 (H) 65 - 99 mg/dL  CBC with Differential/Platelet     Status: None   Collection Time: 03/28/18  9:17 AM  Result Value Ref Range   WBC 6.2 4.0 - 10.5 K/uL   RBC 4.69 4.22 - 5.81 MIL/uL   Hemoglobin 14.8 13.0 - 17.0 g/dL   HCT 45.4 39.0 - 52.0 %   MCV 96.8 78.0 - 100.0 fL   MCH 31.6 26.0 - 34.0 pg   MCHC 32.6 30.0 - 36.0 g/dL   RDW 13.2 11.5 - 15.5 %   Platelets 186 150 - 400 K/uL   Neutrophils Relative % 54 %   Neutro Abs 3.4 1.7 - 7.7 K/uL   Lymphocytes Relative 29 %   Lymphs Abs 1.8 0.7 - 4.0 K/uL   Monocytes Relative 12 %   Monocytes Absolute 0.7 0.1 - 1.0 K/uL   Eosinophils Relative 5 %   Eosinophils Absolute 0.3 0.0 - 0.7 K/uL   Basophils Relative 1 %  Basophils Absolute 0.0 0.0 - 0.1 K/uL   Immature Granulocytes 1 %   Abs Immature Granulocytes 0.0 0.0 - 0.1 K/uL    Comment: Performed at [facility], [place] [address], [place], [place] [address]  Basic metabolic panel     Status: Abnormal   Collection Time: 03/28/18  9:17 AM  Result Value Ref Range   Sodium 141 135 - 145 mmol/L   Potassium 4.1 3.5 - 5.1 mmol/L   Chloride 107 101 - 111 mmol/L   CO2 27 22 - 32 mmol/L   Glucose, Bld 132 (H) 65 - 99 mg/dL   BUN 13 6 - 20 mg/dL   Creatinine, Ser 0.89 0.61 - 1.24 mg/dL   Calcium 9.1 8.9 - 10.3 mg/dL   GFR calc non Af Amer >60 >60 mL/min   GFR calc Af Amer >60 >60 mL/min    Comment: (NOTE) The eGFR has been calculated using the CKD EPI  equation. This calculation has not been validated in all clinical situations. eGFR's persistently <60 mL/min signify possible Chronic Kidney Disease.    Anion gap 7 5 - 15    Comment: Performed at [facility] [address], [place], [place] [address]  Glucose, capillary     Status: Abnormal   Collection Time: 03/28/18 11:51 AM  Result Value Ref Range   Glucose-Capillary 168 (H) 65 - 99 mg/dL  Glucose, capillary     Status: Abnormal   Collection Time: 03/28/18  6:12 PM  Result Value Ref Range   Glucose-Capillary 137 (H) 65 - 99 mg/dL  Glucose, capillary     Status: Abnormal   Collection Time: 03/28/18  9:30 PM  Result Value Ref Range   Glucose-Capillary 142 (H) 65 - 99 mg/dL  Glucose, capillary     Status: Abnormal   Collection Time: 03/29/18  6:30 AM  Result Value Ref Range   Glucose-Capillary 104 (H) 65 - 99 mg/dL  RPR     Status: None   Collection Time: 03/29/18 11:19 AM  Result Value Ref Range   RPR Ser Ql Non Reactive Non Reactive    Comment: (NOTE) Performed At: [facility] [address] [place], [place] [id number] Rush Farmer MD [id number] Performed at [facility], [place] [address], [place], [place] [address]   Vitamin B12     Status: None   Collection Time: 03/29/18 11:19 AM  Result Value Ref Range   Vitamin B-12 675 180 - 914 pg/mL    Comment: (NOTE) This assay is not validated for testing neonatal or myeloproliferative syndrome specimens for Vitamin B12 levels. Performed at [facility], [place] [address] [place],  [address]   Sedimentation rate     Status: None   Collection Time: 03/29/18 11:19 AM  Result Value Ref Range   Sed Rate 5 0 - 16 mm/hr    Comment: Performed at [facility] [address] [place] [address]  CBC     Status: None   Collection Time: 03/29/18 11:19 AM  Result Value Ref Range   WBC 6.5 4.0 - 10.5 K/uL   RBC 4.84 4.22 - 5.81 MIL/uL   Hemoglobin 15.1 13.0 - 17.0 g/dL   HCT  46.8 39.0 - 52.0 %   MCV 96.7 78.0 - 100.0 fL   MCH 31.2 26.0 - 34.0 pg   MCHC 32.3 30.0 - 36.0 g/dL   RDW 13.2 11.5 - 15.5 %   Platelets 224 150 - 400  K/uL    Comment: Performed at [facility], [place] [address], [place], [place] [address]  HIV antibody (routine testing) (NOT for [facility])     Status: None   Collection Time: 03/29/18 11:19 AM  Result Value Ref Range   HIV Screen 4th Generation wRfx Non Reactive Non Reactive    Comment: (NOTE) Performed At: [facility] [place], [place] [id number] Rush Farmer MD [id number] Performed at [facility], [place] [address], [place], [place] [address]   TSH     Status: None   Collection Time: 03/29/18 11:20 AM  Result Value Ref Range   TSH 2.098 0.350 - 4.500 uIU/mL    Comment: Performed by a 3rd Generation assay with a functional sensitivity of <=0.01 uIU/mL. Performed at [facility], [place] [address], [place], [place] [address]   Folate     Status: None   Collection Time: 03/29/18 11:20 AM  Result Value Ref Range   Folate 30.5 >5.9 ng/mL    Comment: Performed at [facility], [place] [address], [place],  [address]  Glucose, capillary     Status: Abnormal   Collection Time: 03/29/18 11:28 AM  Result Value Ref Range   Glucose-Capillary 123 (H) 65 - 99 mg/dL  Glucose, capillary     Status: Abnormal   Collection Time: 03/29/18  4:24 PM  Result Value Ref Range   Glucose-Capillary 119 (H) 65 - 99 mg/dL  Glucose, capillary     Status: Abnormal   Collection Time: 03/29/18  9:36 PM  Result Value Ref Range   Glucose-Capillary 168 (H) 65 - 99 mg/dL  Glucose, capillary     Status: Abnormal   Collection Time: 03/30/18  6:52 AM  Result Value Ref Range   Glucose-Capillary 105 (H) 65 - 99 mg/dL   Comment 1 Notify RN      Constitutional: No distress . Vital signs reviewed. HEENT: EOMI, oral membranes moist Neck: supple Cardiovascular: RRR without murmur. No JVD    Respiratory: CTA  Bilaterally without wheezes or rales. Normal effort    GI: BS +, non-tender, non-distended  Musc: No edema or tenderness in extremities. Neuro: Alert.  Word finding deficits Motor:follows commands with aid of gestural cues RUE: 4- RIght delt bi tri grip RLE: Hip flexion, knee extension, ADF 4/5 LLE: 5/5 proximal to distal, LUE 5/5  .   Assessment/Plan: 1. Functional deficits secondary to left PCA infarct with right hemiparesis, receptive aphasia which require 3+ hours per day of interdisciplinary therapy in a comprehensive inpatient rehab setting. Physiatrist is providing close team supervision and 24 hour management of active medical problems listed below. Physiatrist and rehab team continue to assess barriers to discharge/monitor patient progress toward functional and medical goals. FIM: Function - Bathing Bathing activity did not occur: N/A Position: Wheelchair/chair at sink Body parts bathed by patient: Right arm, Left arm, Chest, Abdomen, Right upper leg, Right lower leg, Left upper leg, Left lower leg Body parts bathed by helper: Back, Front perineal area, Buttocks Assist Level: Touching or steadying assistance(Pt > 75%)  Function- Upper Body Dressing/Undressing Upper body dressing/undressing activity did not occur: N/A What is the patient wearing?: Pull over shirt/dress Pull over shirt/dress - Perfomed by patient: Thread/unthread left sleeve, Pull shirt over trunk Pull over shirt/dress - Perfomed by helper: Thread/unthread right sleeve, Put head through opening Assist Level: Touching or steadying assistance(Pt > 75%) Set up : To obtain clothing/put away Function - Lower Body Dressing/Undressing Lower body  dressing/undressing activity did not occur: N/A What is the patient wearing?: Pants, Underwear Position: Wheelchair/chair at sink Underwear - Performed by patient: Thread/unthread left underwear leg Underwear - Performed by helper: Pull underwear up/down, Thread/unthread  right underwear leg Pants- Performed by patient: Thread/unthread left pants leg Pants- Performed by helper: Thread/unthread right pants leg, Pull pants up/down Non-skid slipper socks- Performed by helper: Don/doff right sock, Don/doff left sock Socks - Performed by patient: Don/doff left sock, Don/doff right sock Socks - Performed by helper: Don/doff right sock Shoes - Performed by patient: Don/doff left shoe, Don/doff right shoe, Fasten left, Fasten right Shoes - Performed by helper: Fasten left, Fasten right Assist for footwear: Supervision/touching assist Assist for lower body dressing: Supervision or verbal cues, Touching or steadying assistance (Pt > 75%)  Function - Toileting Toileting activity did not occur: N/A Toileting steps completed by patient: Adjust clothing prior to toileting, Adjust clothing after toileting Toileting steps completed by helper: Performs perineal hygiene Toileting Assistive Devices: Grab bar or rail Assist level: Touching or steadying assistance (Pt.75%)  Function - Air cabin crew transfer activity did not occur: N/A(this did not occur with me today) Toilet transfer assistive device: Mechanical lift Mechanical lift: Stedy Assist level to toilet: Touching or steadying assistance (Pt > 75%) Assist level from toilet: Touching or steadying assistance (Pt > 75%)  Function - Chair/bed transfer Chair/bed transfer activity did not occur: N/A(this did not occur with me today) Chair/bed transfer method: Stand pivot Chair/bed transfer assist level: Touching or steadying assistance (Pt > 75%) Chair/bed transfer assistive device: Armrests, Walker Mechanical lift: Stedy Chair/bed transfer details: Tactile cues for placement, Tactile cues for weight shifting, Manual facilitation for weight shifting, Manual facilitation for placement, Manual facilitation for weight bearing  Function - Locomotion: Wheelchair Will patient use wheelchair at discharge?:  Yes Type: Manual Wheelchair activity did not occur: Safety/medical concerns Max wheelchair distance: 70 Assist Level: Touching or steadying assistance (Pt > 75%) Wheel 50 feet with 2 turns activity did not occur: Safety/medical concerns Assist Level: Touching or steadying assistance (Pt > 75%) Wheel 150 feet activity did not occur: Safety/medical concerns Assist Level: Touching or steadying assistance (Pt > 75%) Turns around,maneuvers to table,bed, and toilet,negotiates 3% grade,maneuvers on rugs and over doorsills: No Function - Locomotion: Ambulation Ambulation activity did not occur: N/A Assistive device: Walker-rolling, Orthosis Max distance: 30 ft Assist level: Moderate assist (Pt 50 - 74%) Walk 10 feet activity did not occur: Safety/medical concerns Assist level: Moderate assist (Pt 50 - 74%) Walk 50 feet with 2 turns activity did not occur: Safety/medical concerns Assist level: Moderate assist (Pt 50 - 74%) Walk 150 feet activity did not occur: Safety/medical concerns Walk 10 feet on uneven surfaces activity did not occur: Safety/medical concerns  Function - Comprehension Comprehension: Auditory Comprehension assistive device: Hearing aids Comprehension assist level: Understands basic 75 - 89% of the time/ requires cueing 10 - 24% of the time  Function - Expression Expression: Verbal Expression assist level: Expresses basic 75 - 89% of the time/requires cueing 10 - 24% of the time. Needs helper to occlude trach/needs to repeat words.  Function - Social Interaction Social Interaction assist level: Interacts appropriately 75 - 89% of the time - Needs redirection for appropriate language or to initiate interaction.  Function - Problem Solving Problem solving assist level: Solves basic 75 - 89% of the time/requires cueing 10 - 24% of the time  Function - Memory Memory assist level: Recognizes or recalls 50 - 74% of the time/requires cueing  25 - 49% of the time Patient  normally able to recall (first 3 days only): That he or she is in a hospital   Medical Problem List and Plan: 1.  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  Altered mental status but looks back at baseline- repeat CT showed extension of PCA infarct to occipital lobe, per neuro no appreciable change from prior .  MRI motion degraded, but DWI not convincing for new inarct and in fact extension may be subacute/ Vascular Neuro f/u today- consideration for seizure with post ictal state, EEG neg Started depakote, neuro also rec seroquel if confused or agitated at night  2.  DVT Prophylaxis/Anticoagulation:  Eliquis  will serve as DVT prophylaxis as well- per neuro no need to change anticoag choice 3. Pain Management: tylenol prn.  4. Mood: Team to provide ego support to help with frustration.  LCSW to follow for evaluation and support when appropriate.  On Prozac-neuropsych f/u 5. Neuropsych: This patient is not capable of making decisions on his own behalf. 6. Skin/Wound Care: routine pressure relief measures.  7. Fluids/Electrolytes/Nutrition: Monitor I/Os. Added full supervision at meals for safety., BMET normal 6/20 despite AMS intake still 70-90%    8. PAF/CAD/HTN: Monitor HR bid. ON metoprolol and Lipitor.  Vitals:   03/29/18 2034 03/30/18 0533  BP: (!) 145/64 138/67  Pulse: (!) 55 60  Resp: 16 17  Temp: 97.6 F (36.4 C) 97.8 F (36.6 C)  SpO2: 98% 96%   low dose amlodipine started 5/28 Good  control 6/22, asymptomatic brady due to BB 9. T2DM: Hgb A1C- 7.1.  Monitor BS ac/hs and use SSI for elevated BS. Metformin d/ced due to GI Side effects CBG (last 3)  Recent Labs    03/29/18 1624 03/29/18 2136 03/30/18 0652  GLUCAP 119* 168* 105*   Good  control 6/22 continue SSI 10. Myasthenia Gravis with intermittent diplopia: On mestonin 30 mg bid--to be increased to 60 mg bid per discharge summary, daughter doesn't want him to have this, had EMG  confirmation. Monitor for fatiguability, ocular symptoms--- but appears to be tolerating therapy thus far,   -Consider revisiting treatments with daughter if further symptoms arise as pt cannot make a decision, pt also cannot provide subjective symptoms 11. Glaucoma: Continue Ocuvite bid and  Xalatan at bedtime.  12.  Hypoalb- prostat 13.  Insomnia- sleep graph  LOS (Days) 31 A FACE TO FACE EVALUATION WAS PERFORMED  Charlett Blake 03/30/2018, 10:38 AM

## 2018-03-30 NOTE — Progress Notes (Signed)
Interval Hx: Pt remains intermittently confused.  EEG is non-specific with mild gene slowing only.lab work for reversible causes of cognitive impairment is all negative.  No family available at the bedside ROS: unable d/t confusion Allergies Allergies  Allergen Reactions  . Clarithromycin Other (See Comments) and Diarrhea  . Codeine   . Flomax [Tamsulosin Hcl]   . Lamisil [Terbinafine]   . Levofloxacin Other (See Comments)    Diplopia. NEVER put patient on levoquin!  . Sertraline Other (See Comments)  . Sulfa Antibiotics     Home Medications Medications Prior to Admission  Medication Sig Dispense Refill  . ACCU-CHEK SOFTCLIX LANCETS lancets USE AS DIRECTED 100 each 1  . apixaban (ELIQUIS) 5 MG TABS tablet Take 5 mg by mouth 2 (two) times daily.     Marland Kitchen atorvastatin (LIPITOR) 20 MG tablet TAKE 1 TABLET BY MOUTH EVERY DAY 30 tablet 3  . Calcium Citrate-Vitamin D 315-250 MG-UNIT TABS Take 2 tablets by mouth daily.    Marland Kitchen FLUoxetine (PROZAC) 10 MG capsule TAKE 1 CAPSULE BY MOUTH EVERY DAY 30 capsule 3  . fluticasone (FLONASE) 50 MCG/ACT nasal spray SPRAY 2 SPRAYS IN EACH NOSTRIL ONCE A DAY 16 g 2  . latanoprost (XALATAN) 0.005 % ophthalmic solution Place 1 drop into both eyes at bedtime.    Marland Kitchen loratadine (CLARITIN) 10 MG tablet Take 10 mg by mouth daily.    . metFORMIN (GLUCOPHAGE-XR) 500 MG 24 hr tablet Take 1 tablet (500 mg total) by mouth daily with breakfast. 30 tablet 1  . metoprolol succinate (TOPROL-XL) 25 MG 24 hr tablet Take 25 mg by mouth daily. Take with or immediately following a meal.     . Multiple Vitamin (MULTIVITAMIN) tablet Take 1 tablet by mouth daily.    . Multiple Vitamins-Minerals (PRESERVISION AREDS 2) CAPS Take 1 tablet by mouth 2 (two) times daily.     . nitroGLYCERIN (NITROSTAT) 0.4 MG SL tablet Place 0.4 mg under the tongue every 5 (five) minutes as needed. As directed    . ONE TOUCH ULTRA TEST test strip CHECK BLOOD SUGAR TWICE A DAY 180 each 3  . pyridostigmine  (MESTINON) 60 MG tablet Take 1 tablet (60 mg total) by mouth 2 (two) times daily. 30 tablet 0    Hospital Medications . acetaminophen  500 mg Oral TID WC & HS  . amLODipine  5 mg Oral Daily  . apixaban  5 mg Oral BID  . atorvastatin  40 mg Oral Daily  . calcium-vitamin D  1 tablet Oral Daily  . divalproex  500 mg Oral Daily  . feeding supplement (PRO-STAT SUGAR FREE 64)  30 mL Oral BID  . FLUoxetine  10 mg Oral Daily  . fluticasone  1 spray Each Nare Daily  . insulin aspart  0-5 Units Subcutaneous QHS  . insulin aspart  0-9 Units Subcutaneous TID WC  . latanoprost  1 drop Both Eyes QHS  . loratadine  10 mg Oral Daily  . mouth rinse  15 mL Mouth Rinse BID  . metoprolol succinate  25 mg Oral Daily  . multivitamin  1 tablet Oral BID  . multivitamin with minerals  1 tablet Oral Daily  . protein supplement shake  11 oz Oral TID WC & HS     Objective  Intake/Output from previous day: 06/21 0701 - 06/22 0700 In: 480 [P.O.:480] Out: 700 [Urine:700] Intake/Output this shift: Total I/O In: 240 [P.O.:240] Out: 100 [Urine:100] Nutritional status:  Diet Order  Diet heart healthy/carb modified Room service appropriate? Yes; Fluid consistency: Thin  Diet effective now           Physical Exam -  Vitals:   03/29/18 0441 03/29/18 1359 03/29/18 2034 03/30/18 0533  BP: (!) 145/58 (!) 123/54 (!) 145/64 138/67  Pulse: 60 (!) 58 (!) 55 60  Resp: 20 17 16 17   Temp: 99 F (37.2 C) (!) 97.5 F (36.4 C) 97.6 F (36.4 C) 97.8 F (36.6 C)  TempSrc: Oral Oral Oral Oral  SpO2: 95% 99% 98% 96%  Weight:      Height:       General - elderly Caucasian male in mild distress, confused Heart - Regular rate and rhythm - no murmer Lungs - Clear to auscultation Abdomen - Soft - non tender Extremities - Distal pulses intact - no edema Skin - Warm and dry  Neurologic Exam:  Mental Status:  Alert, orienteds to self and knows he is in rehab, but cannot give any other details. Recall  0/3.poor insight into his illness. Able to name only 5 animals with for legs. Diminished attention, registration poor insight into his illness.Tthought content is not appropriate. Speech mild nonfluent speech with word hesitation and some receptive greater than expressive aphasia..Has difficulty following commands without difficulty. Mild right-sided inattention Cranial Nerves:  II-dense right homonymous hemianopsia and does not blink to threat on the right but does so on the left.t, tracks. III/IV/VI-Pupils were equal and reacted. Extraocular movements were full.  V/VII-no facial numbness and no facial weakness.  VIII-hearing normal.  X-normal speech and symmetrical palatal movement.  XII-midline tongue extension  Motor: Right : Upper extremity   4/5    Left:     Upper extremity   5/5  Rt Lower extremity   4/5     Lt Lower extremity   5/5 Tone is increased in right. Sensory: decreased on right Plantars: mute Cerebellar: dysmetria on right FTN. Limited exam d/t inability to follow all commands Gait: not tested    LABORATORY RESULTS:  Basic Metabolic Panel: Recent Labs  Lab 03/25/18 0530 03/28/18 0917  NA 138 141  K 3.9 4.1  CL 106 107  CO2 26 27  GLUCOSE 130* 132*  BUN 29* 13  CREATININE 0.91 0.89  CALCIUM 8.6* 9.1    Liver Function Tests: No results for input(s): AST, ALT, ALKPHOS, BILITOT, PROT, ALBUMIN in the last 168 hours. No results for input(s): LIPASE, AMYLASE in the last 168 hours. No results for input(s): AMMONIA in the last 168 hours.  CBC: Recent Labs  Lab 03/25/18 0530 03/28/18 0917 03/29/18 1119  WBC 5.2 6.2 6.5  NEUTROABS  --  3.4  --   HGB 13.8 14.8 15.1  HCT 42.6 45.4 46.8  MCV 95.7 96.8 96.7  PLT 173 186 224    Cardiac Enzymes: No results for input(s): CKTOTAL, CKMB, CKMBINDEX, TROPONINI in the last 168 hours.  Lipid Panel: No results for input(s): CHOL, TRIG, HDL, CHOLHDL, VLDL, LDLCALC in the last 168 hours.  CBG: Recent Labs  Lab  03/29/18 0630 03/29/18 1128 03/29/18 1624 03/29/18 2136 03/30/18 0652  GLUCAP 104* 123* 119* 168* 105*    Microbiology:   Coagulation Studies: No results for input(s): LABPROT, INR in the last 72 hours.  Miscellaneous Labs:   IMAGING RESULTS Dg Chest 2 View  Result Date: 03/29/2018 CLINICAL DATA:  Pneumonia. EXAM: CHEST - 2 VIEW COMPARISON:  03/28/2018 FINDINGS: Sequelae of prior CABG are again identified. The cardiomediastinal silhouette is unchanged with  normal heart size. The lungs are hypoinflated with minimal bibasilar atelectasis, improved from prior. No pleural effusion or pneumothorax is identified. No acute osseous abnormality is seen. IMPRESSION: Hypoinflation with minimal bibasilar atelectasis. Electronically Signed   By: Logan Bores M.D.   On: 03/29/2018 14:41   Dg Chest 2 View  Result Date: 03/28/2018 CLINICAL DATA:  Confusion.  Disorientation. EXAM: CHEST - 2 VIEW COMPARISON:  03/01/2018. FINDINGS: Prior CABG. Heart size normal. Normal pulmonary vascularity. Low lung volumes with mild bibasilar atelectasis/infiltrates. No pleural effusion or pneumothorax. IMPRESSION: 1.  Prior CABG.  Heart size normal. 2.  Low lung volumes with mild bibasilar atelectasis/infiltrates. Electronically Signed   By: Marcello Moores  Register   On: 03/28/2018 13:11   Ct Head Wo Contrast  Result Date: 03/28/2018 CLINICAL DATA:  Stroke.  Confusion EXAM: CT HEAD WITHOUT CONTRAST TECHNIQUE: Contiguous axial images were obtained from the base of the skull through the vertex without intravenous contrast. COMPARISON:  MRI 02/23/2018, CT 02/22/2018 FINDINGS: Brain: Acute infarct left PCA territory. Hypodensity in the left medial temporal lobe extending into the occipital lobe. Left thalamus involved. There has been progression of infarct since the prior MRI which has extended to the occipital lobe. Small area of petechial hemorrhage has developed in the left occipital lobe measuring less than 1 cm Generalized  atrophy. Mild chronic microvascular ischemic change in the white matter. Negative for mass lesion. Vascular: Negative for hyperdense vessel Skull: Negative Sinuses/Orbits: Mucosal edema paranasal sinuses. Bilateral cataract surgery Other: None IMPRESSION: Acute infarct left posterior cerebral artery territory has extended into the left occipital lobe since the prior MRI of 02/23/2018. Small subcentimeter area of petechial hemorrhage has developed in the left occipital infarct These results will be called to the ordering clinician or representative by the Radiologist Assistant, and communication documented in the PACS or zVision Dashboard. Electronically Signed   By: Franchot Gallo M.D.   On: 03/28/2018 13:08   Mr Brain Wo Contrast  Result Date: 03/28/2018 CLINICAL DATA:  Confusion, altered mental status. History of migraines, hypertension, hypercholesterolemia, diabetes, stroke. EXAM: MRI HEAD WITHOUT CONTRAST TECHNIQUE: Multiplanar, multiecho pulse sequences of the brain and surrounding structures were obtained without intravenous contrast. COMPARISON:  CT HEAD March 28, 2018 and MRI of the head Feb 23, 2018 FINDINGS: Moderately motion degraded examination. INTRACRANIAL CONTENTS: Reduced diffusion LEFT mesial temporal occipital lobes, limited assessment on ADC maps due to motion without convincing low ADC values. New patchy susceptibility artifact LEFT occipital lobe. A few scattered chronic microhemorrhages. No parenchymal brain volume loss for age. No hydrocephalus. Patchy supratentorial white matter FLAIR T2 hyperintensities compatible with mild chronic small vessel ischemic changes. Old LEFT basal ganglia and thalamus infarcts. Prominent basal ganglia T2 hyperintensities seen with chronic small vessel ischemic changes. No abnormal extra-axial fluid collections. VASCULAR: Normal major intracranial vascular flow voids present at skull base. SKULL AND UPPER CERVICAL SPINE: No abnormal sellar expansion. No  suspicious calvarial bone marrow signal. Craniocervical junction maintained. SINUSES/ORBITS: Mild paranasal sinus mucosal thickening without Air-fluid levels. Mastoid air cells are well aerated. Status post bilateral ocular lens implants.The included ocular globes and orbital contents are non-suspicious. OTHER: None. IMPRESSION: 1. Motion degraded examination. 2. Further propagation LEFT PCA territory infarct is likely subacute with petechial hemorrhage. 3. Mild chronic small vessel ischemic changes. Old LEFT basal ganglia and thalamus small infarcts. Electronically Signed   By: Elon Alas M.D.   On: 03/28/2018 18:34     EEG: Epileptiform features were not seen during this recording.The EEG is abnormal  and findings are consistent with very mild generalized cerebral dysfunction. This is very non-specific and doesn't r/o seizures.   Assessment/Plan: This is 84yr old man in rehab post stroke now with transient speech and confusion. 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 MRI reviewed, it shows no definite new infarct. EEG non specific, but cannot r/o seizures. He has high risk for post stroke seizures and with underlying dementia this also puts him at risk. DDX: Delirium, acutely decompensated with likely underlying non-diagnosed dementia.   PLAN/RECS: Limit pain meds or sedation meds continue Depakote 500 ER mg daily QHS  For agitation if needed as no clear evidence of seizures on EEG noted.if hallucinations continue may need low-dose Seroquel. If cognitive impairment persists may consider trial of Aricept for dementia in the near future. No family available at the bedside for discussion.I have d/w Dr. Dianna Limbo .greater than 50% time during this 25 minute visit was spent in counseling and coordination of care about his agitation and recent stroke.  Stroke team will sign off. Kindly call for questions.      Antony Contras, MD Medical Director [facility] Pager: [phone] 03/30/2018 11:24 AM

## 2018-03-30 NOTE — Progress Notes (Signed)
Pt presented increased confusion, agitation. Pt was anxious and combative during care. Pt stated "I am out of sorts" and stated he does not know where he is. Staff tried to redirect pt to place and situation, not successful.  CBG and Vitals are WDL. Dr. Letta Pate notified of pt status change.  Latronda Spink W Jimy Gates

## 2018-03-30 NOTE — Progress Notes (Signed)
Physical Therapy Weekly Progress Note  Patient Details  Name: Kyle Richardson MRN: [id number] Date of Birth: 11-29-1927  Beginning of progress report period: March 21, 2018 End of progress report period: March 29, 2018  Patient has met 5 of 9 long term goals. Pt is performing most mobility, including transfers and gait, at the min-mod assist level depending on fatigue and mental status. He has had intermittent bouts of altered mental status over last few days, making functional mobility difficult 2/2 sequencing impairments on top of confusion.   Patient continues to demonstrate the following deficits muscle weakness and muscle joint tightness, decreased cardiorespiratoy endurance, abnormal tone, unbalanced muscle activation, motor apraxia, decreased coordination and decreased motor planning, decreased visual perceptual skills, decreased attention to right, decreased initiation, decreased attention, decreased awareness, decreased problem solving, decreased safety awareness, decreased memory and delayed processing and decreased sitting balance, decreased standing balance, decreased postural control, hemiplegia and decreased balance strategies and therefore will continue to benefit from skilled PT intervention to increase functional independence with mobility.  Patient progressing toward long term goals..  Continue plan of care as medically appropriate. Will continue to work towards safe d/c home w/ daughter and son-in-law as primary caregivers.   PT Short Term Goals Week 4:  PT Short Term Goal 1 (Week 4): =LTGs due to ELOS Week 5:  PT Short Term Goal 1 (Week 5): =LTGs due to ELOS  [name] 03/30/2018, 7:56 AM

## 2018-03-30 NOTE — Progress Notes (Signed)
Occupational Therapy Session Note  Patient Details  Name: Kyle Richardson MRN: [id number] Date of Birth: [birth year]  Today's Date: 03/30/2018 OT Individual Time: 0700-0800 OT Individual Time Calculation (min): 60 min    Short Term Goals: Week 4:  OT Short Term Goal 1 (Week 4): STG=LTG 2/2 ELOS  Skilled Therapeutic Interventions/Progress Updates:    Treatment session focused on ADLs/self care training, transfer training, neuromuscular reeducation, and pt education. Upon entering room, pt in bed resting and agreeable to AM ADLs. Pt on continuous IV per MD and unable to shower at this time. Pt transitioned from supine to EOB sitting with S and tolerated upright sitting for d/b with F+ balance. . Pt required mod v/c throughout UB d/b for hemiplegia dressing technique and required mod A for completion of task to avoid frustration and increased confusion. Pt transferred from EOB to w/c with mod A at SPT  With v/c for hand/foot placement to completed LB d/b at sink. Pt completed sit<>stand at sink with min A for LB d/b with max A for thoroughness. Therapist instructed pt on LB dressing techniques but noted confusion with task. Pt completed grooming at sink with set up assistance and extra time. Therapist set up pt for breakfast with provided tactile cue for initiating fork to mouth to take a bites of food. Pt left resting in w/c with call bell in reach while eating breakfast. No c/o pain at this time.   Therapy Documentation Precautions:  Precautions Precautions: Fall Precaution Comments: R hemiparesis, ataxia Restrictions Weight Bearing Restrictions: No    Pain: Pain Assessment Pain Score: 0-No pain ADL: ADL ADL Comments: Please see functional navigator  See Function Navigator for Current Functional Status.   Therapy/Group: Individual Therapy  Delon Sacramento 03/30/2018, 11:11 AM

## 2018-03-30 NOTE — Progress Notes (Signed)
Occupational Therapy Session Note  Patient Details  Name: Jaqwan Wieber MRN: [id number] Date of Birth: 1928/05/14  Today's Date: 03/30/2018 OT Individual Time: 1400-1440 OT Individual Time Calculation (min): 40 min   And   1530-1600 30 min   Short Term Goals: Week 3:  OT Short Term Goal 1 (Week 3): Pt will complete toilet transfer with Mod A OT Short Term Goal 1 - Progress (Week 3): Met OT Short Term Goal 2 (Week 3): Pt will use R UE functionally to was chest with no more than min guided A OT Short Term Goal 2 - Progress (Week 3): Met OT Short Term Goal 3 (Week 3): Pt will locate 2/4 grooming tasks on R side of the sink with no more than mod instructional cues OT Short Term Goal 3 - Progress (Week 3): Met Week 4:  OT Short Term Goal 1 (Week 4): STG=LTG 2/2 ELOS  Skilled Therapeutic Interventions/Progress Updates:    1:1. No pain noted. Pt extremely confused today, however not agitated. Pt unable to be reoriented to place, time or situation. Pt completes visual scanning activities of dynavision as well as sorting silverware into a Microbiologist placed in R visual field. Pt initially requiring max VC for scanning to R side to locate lights during seated dynavision, facing to nim VC. Pt requires average of 7 seconds to locate stimulus on L and 15 on R. Pt demo difficulty with motor planning and coordination of LUE this date during reaching and sorting tasks requireing mod HOH A to pick up silverware and reach to "lay it down into organizer." Exited session with pt seated in w/c, call light in reach and all needs met  Session 2: 1:1. No complaint of pain. Pt restless supine in bed upon arrival. Pt continues to be confused and disoriented to time, place and situation. Pt reports needing to toilet. Pt incontinent of bladder in brief, however continent of bowel in toilet after increased time to void. Pt completes stand pivot transfer using grab bar with min A over all and HOH A to place LUE  on grab bar and RUE on w/c. Pt able to complete clothing management with min A and hygiene seated with touching A. Pt requires touching A and manual facilitation of RLE on floor for proper foot placement. Pt cleans table with 4# wrist weight on hand seated in w/c for weight bearing/closed chain movement of RUE to for NMR/improved coordination of gross motor movements. Exited session with direct hand off to PT.  Therapy Documentation Precautions:  Precautions Precautions: Fall Precaution Comments: R hemiparesis, ataxia Restrictions Weight Bearing Restrictions: No General:   Vital Signs: Therapy Vitals Pulse Rate: 73 Resp: 16 BP: 111/64 Patient Position (if appropriate): Sitting Oxygen Therapy SpO2: 96 % O2 Device: Room Air Pain: Pain Assessment Faces Pain Scale: No hurt  See Function Navigator for Current Functional Status.   Therapy/Group: Individual Therapy  Tonny Branch 03/30/2018, 2:40 PM

## 2018-03-30 NOTE — Progress Notes (Signed)
Physical Therapy Session Note  Patient Details  Name: Kyle Richardson MRN: [id number] Date of Birth: [birth year]  Today's Date: 03/30/2018 PT Individual Time: 1610-1650 PT Individual Time Calculation (min): 40 min (make-up time)  Short Term Goals: Week 4:  PT Short Term Goal 1 (Week 4): =LTGs due to ELOS  Skilled Therapeutic Interventions/Progress Updates:    Pt seen for 40 minutes of make-up time this session. Pt seated in w/c, agreeable to therapy tx and denies pain. Pt performed stand pivot transfer from w/c>mat with mod assist towards the left. Pt with increased R LE tone and ataxic movement compared to previous sessions. Pt worked on lateral scooting in each direction with min assist and verbal cues for sequencing/initiation and R LE foot placement. Pt performed sit<>stands from the mat with min assist, therapist providing manual facilitation for R LE positioning to limit extensor tone. Pt able to maintain static standing balance with CGA. Pt worked on dynamic standing balance with single UE support on RW while performing reaching task with R UE for bean bags with hand over hand assist, worked on right attention while performing reaching task with L UE, min assist. Pt worked on stand pivot transfers this session without RW, performed x 3 in each direction with min assist going towards the left and mod-max assist going towards the right, verbal cues for sequencing. Pt transported back to room at end of session and left seated in w/c with needs in reach, QRB in place and chair alarm set.   Therapy Documentation Precautions:  Precautions Precautions: Fall Precaution Comments: R hemiparesis, ataxia Restrictions Weight Bearing Restrictions: No   See Function Navigator for Current Functional Status.   Therapy/Group: Individual Therapy  Netta Corrigan, PT, DPT 03/30/2018, 4:12 PM

## 2018-03-30 NOTE — Plan of Care (Signed)
  Problem: Consults Goal: RH STROKE PATIENT EDUCATION Description See Patient Education module for education specifics  Outcome: Progressing   Problem: RH BOWEL ELIMINATION Goal: RH STG MANAGE BOWEL WITH ASSISTANCE Description STG Manage Bowel with mod Assistance.  Outcome: Progressing Goal: RH STG MANAGE BOWEL W/MEDICATION W/ASSISTANCE Description STG Manage Bowel with Medication with mod Assistance.  Outcome: Progressing   Problem: RH BLADDER ELIMINATION Goal: RH STG MANAGE BLADDER WITH ASSISTANCE Description STG Manage Bladder With mod Assistance  Outcome: Progressing   Problem: RH SKIN INTEGRITY Goal: RH STG SKIN FREE OF INFECTION/BREAKDOWN Description Pt will remain free from skin infection/ breakdown with mod assistance.  Outcome: Progressing Goal: RH STG MAINTAIN SKIN INTEGRITY WITH ASSISTANCE Description STG Maintain Skin Integrity With mod Assistance.  Outcome: Progressing   Problem: RH COGNITION-NURSING Goal: RH STG ANTICIPATES NEEDS/CALLS FOR ASSIST W/ASSIST/CUES Description STG Anticipates Needs/Calls for Assist With mod Assistance/Cues.  Outcome: Progressing   Problem: RH KNOWLEDGE DEFICIT Goal: RH STG INCREASE KNOWLEDGE OF DIABETES Description Mod assist  Outcome: Progressing Goal: RH STG INCREASE KNOWLEDGE OF HYPERTENSION Description Mod assist  Outcome: Progressing   Problem: RH SAFETY Goal: RH STG ADHERE TO SAFETY PRECAUTIONS W/ASSISTANCE/DEVICE Description STG Adhere to Safety Precautions With mod  Assistance/Device.  Outcome: Not Progressing   Problem: RH COGNITION-NURSING Goal: RH STG USES MEMORY AIDS/STRATEGIES W/ASSIST TO PROBLEM SOLVE Description STG Uses Memory Aids/Strategies With mod Assistance to Problem Solve.  Outcome: Not Progressing

## 2018-03-30 NOTE — Progress Notes (Signed)
Patient became very confused during the overnight. He was irritated but not combative. By 5 a.m., he was calmer and more cooperative. Nursing continues to monitor.

## 2018-03-31 ENCOUNTER — Inpatient Hospital Stay (HOSPITAL_COMMUNITY): Payer: PPO

## 2018-03-31 LAB — GLUCOSE, CAPILLARY
GLUCOSE-CAPILLARY: 115 mg/dL — AB (ref 65–99)
GLUCOSE-CAPILLARY: 158 mg/dL — AB (ref 65–99)
Glucose-Capillary: 106 mg/dL — ABNORMAL HIGH (ref 65–99)
Glucose-Capillary: 133 mg/dL — ABNORMAL HIGH (ref 65–99)

## 2018-03-31 NOTE — Progress Notes (Signed)
Pt states "I had a good day." Wife and other family members were at bedside for most of the day. Pt is alert and somewhat oriented to place and situation with minimum cuing. Pt was continent of bladder throughout the shift. No BM today. Continue plan of care.  Laurell Coalson W Kelani Robart

## 2018-03-31 NOTE — Progress Notes (Signed)
Subjective/Complaints:  Remains aphasic, RIght field cut persist,Slept better per RN Discussed repeat imaging with Neuro Dr Cheral Marker, no sign of new CVA  ROS: Limited due to cognitive/behavioral    Objective: Vital Signs: Blood pressure 133/63, pulse 60, temperature 98 F (36.7 C), temperature source Oral, resp. rate 19, height 5\' 8"  (1.727 m), weight 73 kg (160 lb 15 oz), SpO2 95 %. Dg Chest 2 View  Result Date: 03/29/2018 CLINICAL DATA:  Pneumonia. EXAM: CHEST - 2 VIEW COMPARISON:  03/28/2018 FINDINGS: Sequelae of prior CABG are again identified. The cardiomediastinal silhouette is unchanged with normal heart size. The lungs are hypoinflated with minimal bibasilar atelectasis, improved from prior. No pleural effusion or pneumothorax is identified. No acute osseous abnormality is seen. IMPRESSION: Hypoinflation with minimal bibasilar atelectasis. Electronically Signed   By: Logan Bores M.D.   On: 03/29/2018 14:41   Results for orders placed or performed during the hospital encounter of 02/27/18 (from the past 72 hour(s))  Glucose, capillary     Status: Abnormal   Collection Time: 03/28/18 11:51 AM  Result Value Ref Range   Glucose-Capillary 168 (H) 65 - 99 mg/dL  Glucose, capillary     Status: Abnormal   Collection Time: 03/28/18  6:12 PM  Result Value Ref Range   Glucose-Capillary 137 (H) 65 - 99 mg/dL  Glucose, capillary     Status: Abnormal   Collection Time: 03/28/18  9:30 PM  Result Value Ref Range   Glucose-Capillary 142 (H) 65 - 99 mg/dL  Glucose, capillary     Status: Abnormal   Collection Time: 03/29/18  6:30 AM  Result Value Ref Range   Glucose-Capillary 104 (H) 65 - 99 mg/dL  RPR     Status: None   Collection Time: 03/29/18 11:19 AM  Result Value Ref Range   RPR Ser Ql Non Reactive Non Reactive    Comment: (NOTE) Performed At: [facility] [address] [place], [place] [id number] Rush Farmer MD [id number] Performed at [facility],  [place] [address] [place], [place] [address]   Vitamin B12     Status: None   Collection Time: 03/29/18 11:19 AM  Result Value Ref Range   Vitamin B-12 675 180 - 914 pg/mL    Comment: (NOTE) This assay is not validated for testing neonatal or myeloproliferative syndrome specimens for Vitamin B12 levels. Performed at [facility], [place] [address], [place], [place] [address]   Sedimentation rate     Status: None   Collection Time: 03/29/18 11:19 AM  Result Value Ref Range   Sed Rate 5 0 - 16 mm/hr    Comment: Performed at [place] [address], [place] [address]  CBC     Status: None   Collection Time: 03/29/18 11:19 AM  Result Value Ref Range   WBC 6.5 4.0 - 10.5 K/uL   RBC 4.84 4.22 - 5.81 MIL/uL   Hemoglobin 15.1 13.0 - 17.0 g/dL   HCT 46.8 39.0 - 52.0 %   MCV 96.7 78.0 - 100.0 fL   MCH 31.2 26.0 - 34.0 pg   MCHC 32.3 30.0 - 36.0 g/dL   RDW 13.2 11.5 - 15.5 %   Platelets 224 150 - 400 K/uL    Comment: Performed at [facility], [place] [address] [place], [place] [address]  HIV antibody (routine testing) (NOT for [facility])     Status: None   Collection Time: 03/29/18 11:19 AM  Result Value Ref Range  HIV Screen 4th Generation wRfx Non Reactive Non Reactive    Comment: (NOTE) Performed At: [facility] [place], [place] [id number] Rush Farmer MD IZ:[id number] Performed at [facility], [place] [address], [place], [place] [address]   TSH     Status: None   Collection Time: 03/29/18 11:20 AM  Result Value Ref Range   TSH 2.098 0.350 - 4.500 uIU/mL    Comment: Performed by a 3rd Generation assay with a functional sensitivity of <=0.01 uIU/mL. Performed at [facility], [place] [address], [place], [place] [address]   Folate     Status: None   Collection Time: 03/29/18 11:20 AM  Result Value Ref Range   Folate 30.5 >5.9 ng/mL    Comment: Performed at [facility], [place] [address] [place], [place] [address]   Glucose, capillary     Status: Abnormal   Collection Time: 03/29/18 11:28 AM  Result Value Ref Range   Glucose-Capillary 123 (H) 65 - 99 mg/dL  Glucose, capillary     Status: Abnormal   Collection Time: 03/29/18  4:24 PM  Result Value Ref Range   Glucose-Capillary 119 (H) 65 - 99 mg/dL  Glucose, capillary     Status: Abnormal   Collection Time: 03/29/18  9:36 PM  Result Value Ref Range   Glucose-Capillary 168 (H) 65 - 99 mg/dL  Glucose, capillary     Status: Abnormal   Collection Time: 03/30/18  6:52 AM  Result Value Ref Range   Glucose-Capillary 105 (H) 65 - 99 mg/dL   Comment 1 Notify RN   Glucose, capillary     Status: Abnormal   Collection Time: 03/30/18 11:45 AM  Result Value Ref Range   Glucose-Capillary 129 (H) 65 - 99 mg/dL  Glucose, capillary     Status: Abnormal   Collection Time: 03/30/18  4:43 PM  Result Value Ref Range   Glucose-Capillary 135 (H) 65 - 99 mg/dL  Glucose, capillary     Status: Abnormal   Collection Time: 03/30/18  9:49 PM  Result Value Ref Range   Glucose-Capillary 133 (H) 65 - 99 mg/dL  Glucose, capillary     Status: Abnormal   Collection Time: 03/31/18  6:26 AM  Result Value Ref Range   Glucose-Capillary 115 (H) 65 - 99 mg/dL     Constitutional: No distress . Vital signs reviewed. HEENT: EOMI, oral membranes moist Neck: supple Cardiovascular: RRR without murmur. No JVD    Respiratory: CTA Bilaterally without wheezes or rales. Normal effort    GI: BS +, non-tender, non-distended  Musc: No edema or tenderness in extremities. Neuro: Alert.  Word finding deficits Motor:follows commands with aid of gestural cues RUE: 4- RIght delt bi tri grip RLE: Hip flexion, knee extension, ADF 4/5 LLE: 5/5 proximal to distal, LUE 5/5  .   Assessment/Plan: 1. Functional deficits secondary to left PCA infarct with right hemiparesis, receptive aphasia which require 3+ hours per day of interdisciplinary therapy in a comprehensive inpatient rehab  setting. Physiatrist is providing close team supervision and 24 hour management of active medical problems listed below. Physiatrist and rehab team continue to assess barriers to discharge/monitor patient progress toward functional and medical goals. FIM: Function - Bathing Bathing activity did not occur: N/A Position: Wheelchair/chair at sink Body parts bathed by patient: Right arm, Left arm, Chest, Abdomen, Right upper leg, Right lower leg, Left upper leg, Left lower leg Body parts bathed by helper: Back, Front perineal area, Buttocks Assist  Level: Touching or steadying assistance(Pt > 75%)  Function- Upper Body Dressing/Undressing Upper body dressing/undressing activity did not occur: N/A What is the patient wearing?: Pull over shirt/dress Pull over shirt/dress - Perfomed by patient: Thread/unthread left sleeve, Pull shirt over trunk Pull over shirt/dress - Perfomed by helper: Thread/unthread right sleeve, Put head through opening Assist Level: Touching or steadying assistance(Pt > 75%) Set up : To obtain clothing/put away Function - Lower Body Dressing/Undressing Lower body dressing/undressing activity did not occur: N/A What is the patient wearing?: Pants, Underwear Position: Wheelchair/chair at sink Underwear - Performed by patient: Thread/unthread left underwear leg Underwear - Performed by helper: Pull underwear up/down, Thread/unthread right underwear leg Pants- Performed by patient: Thread/unthread left pants leg Pants- Performed by helper: Thread/unthread right pants leg, Pull pants up/down Non-skid slipper socks- Performed by helper: Don/doff right sock, Don/doff left sock Socks - Performed by patient: Don/doff left sock, Don/doff right sock Socks - Performed by helper: Don/doff right sock Shoes - Performed by patient: Don/doff left shoe, Don/doff right shoe, Fasten left, Fasten right Shoes - Performed by helper: Fasten left, Fasten right Assist for footwear:  Supervision/touching assist Assist for lower body dressing: Supervision or verbal cues, Touching or steadying assistance (Pt > 75%)  Function - Toileting Toileting activity did not occur: N/A Toileting steps completed by patient: Adjust clothing prior to toileting, Adjust clothing after toileting Toileting steps completed by helper: Performs perineal hygiene Toileting Assistive Devices: Grab bar or rail Assist level: Touching or steadying assistance (Pt.75%)  Function - Air cabin crew transfer activity did not occur: N/A(this did not occur with me today) Toilet transfer assistive device: Mechanical lift Mechanical lift: Stedy Assist level to toilet: Touching or steadying assistance (Pt > 75%) Assist level from toilet: Touching or steadying assistance (Pt > 75%)  Function - Chair/bed transfer Chair/bed transfer activity did not occur: N/A(this did not occur with me today) Chair/bed transfer method: Stand pivot Chair/bed transfer assist level: Moderate assist (Pt 50 - 74%/lift or lower) Chair/bed transfer assistive device: Armrests, Walker Mechanical lift: Stedy Chair/bed transfer details: Verbal cues for precautions/safety, Verbal cues for technique, Visual cues/gestures for precautions/safety  Function - Locomotion: Wheelchair Will patient use wheelchair at discharge?: Yes Type: Manual Wheelchair activity did not occur: Safety/medical concerns Max wheelchair distance: 70 Assist Level: Touching or steadying assistance (Pt > 75%) Wheel 50 feet with 2 turns activity did not occur: Safety/medical concerns Assist Level: Touching or steadying assistance (Pt > 75%) Wheel 150 feet activity did not occur: Safety/medical concerns Assist Level: Touching or steadying assistance (Pt > 75%) Turns around,maneuvers to table,bed, and toilet,negotiates 3% grade,maneuvers on rugs and over doorsills: No Function - Locomotion: Ambulation Ambulation activity did not occur: N/A Assistive  device: Walker-rolling, Orthosis Max distance: 30 ft Assist level: Moderate assist (Pt 50 - 74%) Walk 10 feet activity did not occur: Safety/medical concerns Assist level: Moderate assist (Pt 50 - 74%) Walk 50 feet with 2 turns activity did not occur: Safety/medical concerns Assist level: Moderate assist (Pt 50 - 74%) Walk 150 feet activity did not occur: Safety/medical concerns Walk 10 feet on uneven surfaces activity did not occur: Safety/medical concerns  Function - Comprehension Comprehension: Auditory Comprehension assistive device: Hearing aids Comprehension assist level: Understands basic 75 - 89% of the time/ requires cueing 10 - 24% of the time  Function - Expression Expression: Verbal Expression assist level: Expresses basic 75 - 89% of the time/requires cueing 10 - 24% of the time. Needs helper to occlude trach/needs to repeat words.  Function - Social Interaction Social Interaction assist level: Interacts appropriately 75 - 89% of the time - Needs redirection for appropriate language or to initiate interaction.  Function - Problem Solving Problem solving assist level: Solves basic 75 - 89% of the time/requires cueing 10 - 24% of the time  Function - Memory Memory assist level: Recognizes or recalls 50 - 74% of the time/requires cueing 25 - 49% of the time Patient normally able to recall (first 3 days only): That he or she is in a hospital   Medical Problem List and Plan: 1.  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  Altered mental status but looks back at baseline- repeat CT showed extension of PCA infarct to occipital lobe, per neuro no appreciable change from prior .  MRI motion degraded, but DWI not convincing for new inarct and in fact extension may be subacute/ Vascular Neuro appreciated consideration for seizure with post ictal state, EEG neg Started depakote, neuro also rec seroquel if confused or agitated at night  Did well on  Seroquel yesterday pm     2.  DVT Prophylaxis/Anticoagulation:  Eliquis  will serve as DVT prophylaxis as well- per neuro no need to change anticoag choice 3. Pain Management: tylenol prn.  4. Mood: Team to provide ego support to help with frustration.  LCSW to follow for evaluation and support when appropriate.  On Prozac-neuropsych f/u 5. Neuropsych: This patient is not capable of making decisions on his own behalf. 6. Skin/Wound Care: routine pressure relief measures.  7. Fluids/Electrolytes/Nutrition: Monitor I/Os. Added full supervision at meals for safety., BMET normal 6/20 despite fluid 648ml    8. PAF/CAD/HTN: Monitor HR bid. ON metoprolol and Lipitor.  Vitals:   03/31/18 0334 03/31/18 0852  BP: (!) 113/57 133/63  Pulse: (!) 54 60  Resp: 19   Temp: 98 F (36.7 C)   SpO2: 95%    low dose amlodipine started 5/28 Good  control 6/23, asymptomatic brady due to BB 9. T2DM: Hgb A1C- 7.1.  Monitor BS ac/hs and use SSI for elevated BS. Metformin d/ced due to GI Side effects CBG (last 3)  Recent Labs    03/30/18 1643 03/30/18 2149 03/31/18 0626  GLUCAP 135* 133* 115*   Good  control 6/23 continue SSI 10. Myasthenia Gravis with intermittent diplopia: On mestonin 30 mg bid--to be increased to 60 mg bid per discharge summary, daughter doesn't want him to have this, had EMG confirmation. Monitor for fatiguability, ocular symptoms--- but appears to be tolerating therapy thus far,   -Consider revisiting treatments with daughter if further symptoms arise as pt cannot make a decision, pt also cannot provide subjective symptoms 11. Glaucoma: Continue Ocuvite bid and  Xalatan at bedtime.  12.  Hypoalb- prostat 13.  Insomnia- sleep graph  LOS (Days) 32 A FACE TO FACE EVALUATION WAS PERFORMED  Charlett Blake 03/31/2018, 9:58 AM

## 2018-03-31 NOTE — Progress Notes (Signed)
Occupational Therapy Session Note  Patient Details  Name: Kyle Richardson MRN: [id number] Date of Birth: 1928/04/16  Today's Date: 03/31/2018 OT Individual Time: 0915-1000 OT Individual Time Calculation (min): 45 min    Short Term Goals: Week 4:  OT Short Term Goal 1 (Week 4): STG=LTG 2/2 ELOS  Skilled Therapeutic Interventions/Progress Updates:    1;1. Attempted to see pt early for make up time, however pt eating and RN administering medications. Upon return pt upright in bed ready for tx. Supine>sitting with increased time and supervision. Pt completesstand pivot transfers with min A and manual facilitation of hand placement. Pt requires VC for adjusting water temp at sink to locate cool water faucet handle. Pt with less confusion today. PT washes  With min A for balance to wash buttocks and HOH A to wash LUE with RUE. Pt dons pants with VC for hemi techniques and CGA to advance patns past hips pt dons socks with VC for 1 handed technique. Pt able to recall UB hemi dressing techniques and don shirt with supervision with VC for shirt orientation. Pt grooms seated at sink with supervision for locating items and min HOH A to squeeze toothpaste onto toothbrush with RUE for NMR. Pt completes toilet transfer with grab bar and min A and simulated toileting with CGA at standing level for clothing management.  Therapy Documentation Precautions:  Precautions Precautions: Fall Precaution Comments: R hemiparesis, ataxia Restrictions Weight Bearing Restrictions: No  See Function Navigator for Current Functional Status.   Therapy/Group: Individual Therapy  Tonny Branch 03/31/2018, 9:05 AM

## 2018-03-31 NOTE — Plan of Care (Signed)
  Problem: Consults Goal: RH STROKE PATIENT EDUCATION Description See Patient Education module for education specifics  Outcome: Progressing   Problem: RH BOWEL ELIMINATION Goal: RH STG MANAGE BOWEL WITH ASSISTANCE Description STG Manage Bowel with mod Assistance.  Outcome: Progressing Goal: RH STG MANAGE BOWEL W/MEDICATION W/ASSISTANCE Description STG Manage Bowel with Medication with mod Assistance.  Outcome: Progressing   Problem: RH BLADDER ELIMINATION Goal: RH STG MANAGE BLADDER WITH ASSISTANCE Description STG Manage Bladder With mod Assistance  Outcome: Progressing   Problem: RH SKIN INTEGRITY Goal: RH STG SKIN FREE OF INFECTION/BREAKDOWN Description Pt will remain free from skin infection/ breakdown with mod assistance.  Outcome: Progressing Goal: RH STG MAINTAIN SKIN INTEGRITY WITH ASSISTANCE Description STG Maintain Skin Integrity With mod Assistance.  Outcome: Progressing   Problem: RH SAFETY Goal: RH STG ADHERE TO SAFETY PRECAUTIONS W/ASSISTANCE/DEVICE Description STG Adhere to Safety Precautions With mod  Assistance/Device.  Outcome: Progressing   Problem: RH COGNITION-NURSING Goal: RH STG USES MEMORY AIDS/STRATEGIES W/ASSIST TO PROBLEM SOLVE Description STG Uses Memory Aids/Strategies With mod Assistance to Problem Solve.  Outcome: Progressing Goal: RH STG ANTICIPATES NEEDS/CALLS FOR ASSIST W/ASSIST/CUES Description STG Anticipates Needs/Calls for Assist With mod Assistance/Cues.  Outcome: Progressing   Problem: RH KNOWLEDGE DEFICIT Goal: RH STG INCREASE KNOWLEDGE OF DIABETES Description Mod assist  Outcome: Progressing Goal: RH STG INCREASE KNOWLEDGE OF HYPERTENSION Description Mod assist  Outcome: Progressing

## 2018-03-31 NOTE — Progress Notes (Signed)
Physical Therapy Session Note  Patient Details  Name: Kyle Richardson MRN: [id number] Date of Birth: 20-Apr-1928  Today's Date: 03/31/2018 PT Individual Time: 1515-1600 PT Individual Time Calculation (min): 45 min   Short Term Goals: Week 5:  PT Short Term Goal 1 (Week 5): =LTGs due to ELOS  Skilled Therapeutic Interventions/Progress Updates:    Pt seated in w/c upon PT arrival, agreeable to therapy tx and denies pain. Pt transported from room>gym in w/c. Pt performed x 3 stand pivot transfers in each direction without AD, min-mod assist with verbal cues for sequencing, breaking down the task with one command at a time. Pt transferred from sit<>stand with min assist to position R LE and prevent extensor tone, using RW. Pt able to maintain static standing balance with RW and SBA. Pt performed sitting>supine with supervision. In supine therapist performed R LE hamstring stretch 2 x 30 sec, while stretching therapist also worked on re-orienting pt with pt able to select location when given 3 options. In supine pt worked on coordination with R UE touching hand to noes and then to target, worked on LE coordination touching foot to target, verbal cues for use of visual scanning. Pt transferred to sitting with supervision. Pt asking why his right side feels different, therapist educating pt on stroke, sensation deficits and ataxia. Pt performed stand pivot back to w/c with min assist, verbal cues for sequencing. Propelled x 50 ft with supervision using B LEs, left seated in w/c at end of session with QRB in place and chair alarm set.   Therapy Documentation Precautions:  Precautions Precautions: Fall Precaution Comments: R hemiparesis, ataxia Restrictions Weight Bearing Restrictions: No   See Function Navigator for Current Functional Status.   Therapy/Group: Individual Therapy  Netta Corrigan, PT, DPT 03/31/2018, 7:59 AM

## 2018-03-31 NOTE — Progress Notes (Signed)
Patient was confused early in the shift but he did have a more restful night's sleep. At this time he is resting quietly in bed. Nursing will continue to monitor.

## 2018-04-01 LAB — GLUCOSE, CAPILLARY: Glucose-Capillary: 106 mg/dL — ABNORMAL HIGH (ref 65–99)

## 2018-04-01 MED ORDER — APIXABAN 5 MG PO TABS: 5.0000 mg | ORAL_TABLET | Freq: Two times a day (BID) | ORAL | 1 refills | Status: AC

## 2018-04-01 MED ORDER — DIVALPROEX SODIUM ER 500 MG PO TB24: 500.0000 mg | ORAL_TABLET | Freq: Every day | ORAL | 1 refills | Status: DC

## 2018-04-01 MED ORDER — ACETAMINOPHEN 500 MG PO TABS: 500.0000 mg | ORAL_TABLET | Freq: Four times a day (QID) | ORAL | 0 refills | Status: DC | PRN

## 2018-04-01 MED ORDER — AMLODIPINE BESYLATE 5 MG PO TABS: 5.0000 mg | ORAL_TABLET | Freq: Every day | ORAL | 1 refills | Status: DC

## 2018-04-01 MED ORDER — METOPROLOL SUCCINATE ER 25 MG PO TB24: 25.0000 mg | ORAL_TABLET | Freq: Every day | ORAL | 1 refills | Status: DC

## 2018-04-01 MED ORDER — FLUOXETINE HCL 10 MG PO CAPS: 10.0000 mg | ORAL_CAPSULE | Freq: Every day | ORAL | 3 refills | Status: DC

## 2018-04-01 MED ORDER — QUETIAPINE FUMARATE 25 MG PO TABS: 25.0000 mg | ORAL_TABLET | Freq: Two times a day (BID) | ORAL | 0 refills | Status: DC | PRN

## 2018-04-01 MED ORDER — ATORVASTATIN CALCIUM 40 MG PO TABS: 40.0000 mg | ORAL_TABLET | Freq: Every day | ORAL | 1 refills | Status: DC

## 2018-04-01 NOTE — Plan of Care (Signed)
  Problem: RH Tub/Shower Transfers Goal: LTG Patient will perform tub/shower transfers w/assist (OT) Description LTG: Patient will perform tub/shower transfers with assist, with/without cues using equipment (OT) Outcome: Adequate for Discharge Pt continues to need min/mod A for tub shower transfers

## 2018-04-01 NOTE — Progress Notes (Signed)
PA in with pt and Family for discharge education. Pt OOB in [place]. Belongings packed. Family at bedside . No questions or concerns noted. Pt still remains confused. No c/o pain noted. Skin intact. LBM 6/24. Had incont episode of bladder. Changed clothing. Family and pt taken to main lobby for DC. Pamella Pert, LPN

## 2018-04-01 NOTE — Plan of Care (Signed)
  Problem: Consults Goal: RH STROKE PATIENT EDUCATION Description See Patient Education module for education specifics  04/01/2018 1002 by Erie Noe, LPN Outcome: Adequate for Discharge 04/01/2018 1001 by Erie Noe, LPN Outcome: Progressing   Problem: RH BOWEL ELIMINATION Goal: RH STG MANAGE BOWEL WITH ASSISTANCE Description STG Manage Bowel with mod Assistance.  04/01/2018 1002 by Erie Noe, LPN Outcome: Adequate for Discharge 04/01/2018 1001 by Erie Noe, LPN Outcome: Progressing Goal: RH STG MANAGE BOWEL W/MEDICATION W/ASSISTANCE Description STG Manage Bowel with Medication with mod Assistance.  04/01/2018 1002 by Erie Noe, LPN Outcome: Adequate for Discharge 04/01/2018 1001 by Erie Noe, LPN Outcome: Progressing   Problem: RH BLADDER ELIMINATION Goal: RH STG MANAGE BLADDER WITH ASSISTANCE Description STG Manage Bladder With mod Assistance  04/01/2018 1002 by Erie Noe, LPN Outcome: Adequate for Discharge 04/01/2018 1001 by Erie Noe, LPN Outcome: Progressing   Problem: RH SKIN INTEGRITY Goal: RH STG SKIN FREE OF INFECTION/BREAKDOWN Description Pt will remain free from skin infection/ breakdown with mod assistance.  04/01/2018 1002 by Erie Noe, LPN Outcome: Adequate for Discharge 04/01/2018 1001 by Erie Noe, LPN Outcome: Progressing Goal: RH STG MAINTAIN SKIN INTEGRITY WITH ASSISTANCE Description STG Maintain Skin Integrity With mod Assistance.  04/01/2018 1002 by Erie Noe, LPN Outcome: Adequate for Discharge 04/01/2018 1001 by Erie Noe, LPN Outcome: Progressing   Problem: RH SAFETY Goal: RH STG ADHERE TO SAFETY PRECAUTIONS W/ASSISTANCE/DEVICE Description STG Adhere to Safety Precautions With mod  Assistance/Device.  04/01/2018 1002 by Erie Noe, LPN Outcome: Adequate for Discharge 04/01/2018 1001 by Erie Noe, LPN Outcome: Progressing   Problem: RH COGNITION-NURSING Goal: RH STG USES MEMORY AIDS/STRATEGIES  W/ASSIST TO PROBLEM SOLVE Description STG Uses Memory Aids/Strategies With mod Assistance to Problem Solve.  04/01/2018 1002 by Erie Noe, LPN Outcome: Adequate for Discharge 04/01/2018 1001 by Erie Noe, LPN Outcome: Progressing Goal: RH STG ANTICIPATES NEEDS/CALLS FOR ASSIST W/ASSIST/CUES Description STG Anticipates Needs/Calls for Assist With mod Assistance/Cues.  04/01/2018 1002 by Erie Noe, LPN Outcome: Adequate for Discharge 04/01/2018 1001 by Erie Noe, LPN Outcome: Progressing   Problem: RH KNOWLEDGE DEFICIT Goal: RH STG INCREASE KNOWLEDGE OF DIABETES Description Mod assist  04/01/2018 1002 by Erie Noe, LPN Outcome: Adequate for Discharge 04/01/2018 1001 by Erie Noe, LPN Outcome: Progressing Goal: RH STG INCREASE KNOWLEDGE OF HYPERTENSION Description Mod assist  04/01/2018 1002 by Erie Noe, LPN Outcome: Adequate for Discharge 04/01/2018 1001 by Erie Noe, LPN Outcome: Progressing

## 2018-04-01 NOTE — Progress Notes (Signed)
Speech Language Pathology Discharge Summary  Patient Details  Name: Kyle Richardson MRN: [id number] Date of Birth: December 21, 1927  Today's Date: 04/01/2018   Patient has met 7 of 7 long term goals.  Patient to discharge at overall Min;Mod level.    Clinical Impression/Discharge Summary:   Pt with good progress in ST sessions but presents with recent increased confusion. Suspect that pt will do better at home in familiar environment. Family has arranged for hired caregivers.   Care Partner:  Caregiver Able to Provide Assistance: Yes(With hired caregivers)  Type of Caregiver Assistance: Physical;Cognitive  Recommendation:  [place] SLP;24 hour supervision/assistance  Rationale for SLP Follow Up: Maximize cognitive function and independence;Maximize functional communication;Reduce caregiver burden   Equipment:     Reasons for discharge: Discharged from hospital   Patient/Family Agrees with Progress Made and Goals Achieved: Yes   Function:  Eating Eating   Modified Consistency Diet: No Eating Assist Level: Set up assist for;More than reasonable amount of time;Supervision or verbal cues   Eating Set Up Assist For: Opening containers;Cutting food       Cognition Comprehension Comprehension assist level: Understands basic 50 - 74% of the time/ requires cueing 25 - 49% of the time  Expression   Expression assist level: Expresses basic 50 - 74% of the time/requires cueing 25 - 49% of the time. Needs to repeat parts of sentences.  Social Interaction Social Interaction assist level: Interacts appropriately 75 - 89% of the time - Needs redirection for appropriate language or to initiate interaction.  Problem Solving Problem solving assist level: Solves basic 75 - 89% of the time/requires cueing 10 - 24% of the time  Memory Memory assist level: Recognizes or recalls 50 - 74% of the time/requires cueing 25 - 49% of the time   [name] 04/01/2018, 10:23 AM

## 2018-04-01 NOTE — Progress Notes (Signed)
Social Work  Discharge Note  The overall goal for the admission was met for:   Discharge location: Yes-HOME WITH HIRED 24 HR CAREGIVERS  Length of Stay: Yes-33 DAYS  Discharge activity level: Yes-MIN ASSIST LEVEL  Home/community participation: Yes  Services provided included: MD, RD, PT, OT, SLP, RN, CM, Pharmacy, Neuropsych and SW  Financial Services: Private Insurance: [employer]  Follow-up services arranged: Home Health: [facility]-PT,OT,SP,CNA, DME: [facility]-WHEELCHAIR, TUB BENCH, 3IN1, ROLLING WALKER and Patient/Family has no preference for HH/DME agencies  Comments (or additional information):DAUGHTER HAS HIRED CAREGIVERS FOR PT AND HIS WIFE AT [facility].   Patient/Family verbalized understanding of follow-up arrangements: Yes  Individual responsible for coordination of the follow-up plan: DAUGHTER-DEBBIE  Confirmed correct DME delivered: Elease Hashimoto 04/01/2018    Elease Hashimoto

## 2018-04-02 ENCOUNTER — Telehealth: Payer: Self-pay | Admitting: Internal Medicine

## 2018-04-02 ENCOUNTER — Telehealth: Payer: Self-pay

## 2018-04-02 DIAGNOSIS — Z951 Presence of aortocoronary bypass graft: Secondary | ICD-10-CM | POA: Diagnosis not present

## 2018-04-02 DIAGNOSIS — H532 Diplopia: Secondary | ICD-10-CM | POA: Diagnosis not present

## 2018-04-02 DIAGNOSIS — I2510 Atherosclerotic heart disease of native coronary artery without angina pectoris: Secondary | ICD-10-CM | POA: Diagnosis not present

## 2018-04-02 DIAGNOSIS — H409 Unspecified glaucoma: Secondary | ICD-10-CM | POA: Diagnosis not present

## 2018-04-02 DIAGNOSIS — I480 Paroxysmal atrial fibrillation: Secondary | ICD-10-CM | POA: Diagnosis not present

## 2018-04-02 DIAGNOSIS — I10 Essential (primary) hypertension: Secondary | ICD-10-CM | POA: Diagnosis not present

## 2018-04-02 DIAGNOSIS — G4733 Obstructive sleep apnea (adult) (pediatric): Secondary | ICD-10-CM | POA: Diagnosis not present

## 2018-04-02 DIAGNOSIS — I69320 Aphasia following cerebral infarction: Secondary | ICD-10-CM | POA: Diagnosis not present

## 2018-04-02 DIAGNOSIS — E119 Type 2 diabetes mellitus without complications: Secondary | ICD-10-CM | POA: Diagnosis not present

## 2018-04-02 DIAGNOSIS — G7000 Myasthenia gravis without (acute) exacerbation: Secondary | ICD-10-CM | POA: Diagnosis not present

## 2018-04-02 DIAGNOSIS — Z7901 Long term (current) use of anticoagulants: Secondary | ICD-10-CM | POA: Diagnosis not present

## 2018-04-02 DIAGNOSIS — I69351 Hemiplegia and hemiparesis following cerebral infarction affecting right dominant side: Secondary | ICD-10-CM | POA: Diagnosis not present

## 2018-04-02 NOTE — Telephone Encounter (Signed)
Verbal orders given to jatana at [facility] for PT

## 2018-04-02 NOTE — Telephone Encounter (Signed)
Copied from [place] [phone]. Topic: Quick Communication - See Telephone Encounter >> Apr 02, 2018  4:12 PM Vernona Rieger wrote: CRM for notification. See Telephone encounter for: 04/02/18.  [name], physical therapist from [facility] called and needs verbal orders to start the plan of care. Recommending 1 week 1 and 2 week 3. He has home health aid for personal care for 1 week 1 and 2 week 3. Call back @ [phone].

## 2018-04-02 NOTE — Telephone Encounter (Signed)
Copied from [place] [phone]. Topic: Appointment Scheduling - Scheduling Inquiry for Clinic >> Apr 02, 2018  2:21 PM Burchel, Abbi R wrote:  [name] (pt's wife) is requesting a hosp f/up appt in 7-10 days with Dr.Scott.  Please call back @ [phone].

## 2018-04-02 NOTE — Telephone Encounter (Signed)
Transition Care Management Follow-up Telephone Call  How have you been since you were released from the hospital? Feeling better but patient is still very weak. Patient has paralysis of right side legs  Are stronger than his right arm per patient wife.   Do you understand why you were in the hospital? yes   Do you understand the discharge instrcutions? yes  Items Reviewed:  Medications reviewed: yes  Allergies reviewed: yes  Dietary changes reviewed: yes  Referrals reviewed: yes   Functional Questionnaire:   Activities of Daily Living (ADLs):   He states they are independent in the following: feeding States they require assistance with the following: ambulation, bathing and hygiene, continence, grooming, toileting and dressing   Any transportation issues/concerns?: no   Any patient concerns? Yes, has questions concerning patient loss of vision.   Confirmed importance and date/time of follow-up visits scheduled: yes   Confirmed with patient if condition begins to worsen call PCP or go to the ER.  Patient was given the Call-a-Nurse line [phone]: yes

## 2018-04-07 ENCOUNTER — Encounter: Payer: Self-pay | Admitting: Internal Medicine

## 2018-04-08 DIAGNOSIS — R410 Disorientation, unspecified: Secondary | ICD-10-CM

## 2018-04-08 NOTE — Telephone Encounter (Signed)
Is there something he can try before his appt.

## 2018-04-08 NOTE — Telephone Encounter (Signed)
Reviewed medication list.  Per discharge medications, he was given seroquel to help with sleep and agitation.  He can take one of these before bed.  (per note, rx was given on 04/01/18).

## 2018-04-09 ENCOUNTER — Telehealth: Payer: Self-pay | Admitting: Internal Medicine

## 2018-04-09 NOTE — Telephone Encounter (Signed)
Spoke with patient and his wife. They are going to try seroquel and let me know if it works.

## 2018-04-09 NOTE — Telephone Encounter (Signed)
Verbal orders given for speech therapy. FYI

## 2018-04-09 NOTE — Telephone Encounter (Signed)
Copied from [place] [phone]. Topic: General - Other >> Apr 09, 2018  3:56 PM Cecelia Byars, NT wrote: Reason for CRM: [facility] from [facility]  called and would like verbal orders for home speech therapy 1 week 1 , 2 week 4 please call her [phone]

## 2018-04-09 NOTE — Telephone Encounter (Signed)
See my recommendation.

## 2018-04-09 NOTE — Telephone Encounter (Signed)
Ok

## 2018-04-12 ENCOUNTER — Telehealth: Payer: Self-pay | Admitting: Internal Medicine

## 2018-04-12 NOTE — Telephone Encounter (Signed)
Verbal orders given  

## 2018-04-12 NOTE — Telephone Encounter (Signed)
Copied from [place] [phone]. Topic: General - Other >> Apr 12, 2018 11:15 AM Yvette Rack wrote: Reason for CRM: Sherlynn Stalls OT from [facility] [phone] calling for verbal orders OT 1 week for 1 week  2 week for 3 weeks

## 2018-04-15 ENCOUNTER — Other Ambulatory Visit: Payer: Self-pay

## 2018-04-15 ENCOUNTER — Ambulatory Visit (INDEPENDENT_AMBULATORY_CARE_PROVIDER_SITE_OTHER): Payer: PPO | Admitting: Internal Medicine

## 2018-04-15 ENCOUNTER — Observation Stay: Payer: PPO

## 2018-04-15 ENCOUNTER — Inpatient Hospital Stay
Admission: EM | Admit: 2018-04-15 | Discharge: 2018-04-22 | DRG: 065 | Disposition: A | Discharge status: Home Health | Payer: PPO | Source: Ambulatory Visit | Attending: Internal Medicine | Admitting: Internal Medicine

## 2018-04-15 ENCOUNTER — Emergency Department: Payer: PPO

## 2018-04-15 ENCOUNTER — Encounter: Payer: Self-pay | Admitting: Internal Medicine

## 2018-04-15 ENCOUNTER — Encounter: Payer: Self-pay | Admitting: *Deleted

## 2018-04-15 DIAGNOSIS — Z951 Presence of aortocoronary bypass graft: Secondary | ICD-10-CM | POA: Diagnosis not present

## 2018-04-15 DIAGNOSIS — Z882 Allergy status to sulfonamides status: Secondary | ICD-10-CM

## 2018-04-15 DIAGNOSIS — R29810 Facial weakness: Secondary | ICD-10-CM | POA: Diagnosis present

## 2018-04-15 DIAGNOSIS — I712 Thoracic aortic aneurysm, without rupture: Secondary | ICD-10-CM | POA: Diagnosis not present

## 2018-04-15 DIAGNOSIS — G4730 Sleep apnea, unspecified: Secondary | ICD-10-CM | POA: Diagnosis present

## 2018-04-15 DIAGNOSIS — E119 Type 2 diabetes mellitus without complications: Secondary | ICD-10-CM | POA: Diagnosis not present

## 2018-04-15 DIAGNOSIS — M1990 Unspecified osteoarthritis, unspecified site: Secondary | ICD-10-CM | POA: Diagnosis present

## 2018-04-15 DIAGNOSIS — R531 Weakness: Secondary | ICD-10-CM | POA: Diagnosis not present

## 2018-04-15 DIAGNOSIS — I63532 Cerebral infarction due to unspecified occlusion or stenosis of left posterior cerebral artery: Secondary | ICD-10-CM | POA: Diagnosis not present

## 2018-04-15 DIAGNOSIS — G7000 Myasthenia gravis without (acute) exacerbation: Secondary | ICD-10-CM | POA: Diagnosis present

## 2018-04-15 DIAGNOSIS — Z888 Allergy status to other drugs, medicaments and biological substances status: Secondary | ICD-10-CM | POA: Diagnosis not present

## 2018-04-15 DIAGNOSIS — I480 Paroxysmal atrial fibrillation: Secondary | ICD-10-CM

## 2018-04-15 DIAGNOSIS — Z79899 Other long term (current) drug therapy: Secondary | ICD-10-CM | POA: Diagnosis not present

## 2018-04-15 DIAGNOSIS — Z7901 Long term (current) use of anticoagulants: Secondary | ICD-10-CM

## 2018-04-15 DIAGNOSIS — I69351 Hemiplegia and hemiparesis following cerebral infarction affecting right dominant side: Secondary | ICD-10-CM | POA: Diagnosis not present

## 2018-04-15 DIAGNOSIS — I6523 Occlusion and stenosis of bilateral carotid arteries: Secondary | ICD-10-CM | POA: Diagnosis present

## 2018-04-15 DIAGNOSIS — R4781 Slurred speech: Secondary | ICD-10-CM | POA: Diagnosis present

## 2018-04-15 DIAGNOSIS — E7800 Pure hypercholesterolemia, unspecified: Secondary | ICD-10-CM | POA: Diagnosis present

## 2018-04-15 DIAGNOSIS — I77810 Thoracic aortic ectasia: Secondary | ICD-10-CM | POA: Diagnosis present

## 2018-04-15 DIAGNOSIS — R4182 Altered mental status, unspecified: Secondary | ICD-10-CM

## 2018-04-15 DIAGNOSIS — E1151 Type 2 diabetes mellitus with diabetic peripheral angiopathy without gangrene: Secondary | ICD-10-CM | POA: Diagnosis present

## 2018-04-15 DIAGNOSIS — H409 Unspecified glaucoma: Secondary | ICD-10-CM | POA: Diagnosis present

## 2018-04-15 DIAGNOSIS — H5461 Unqualified visual loss, right eye, normal vision left eye: Secondary | ICD-10-CM | POA: Diagnosis present

## 2018-04-15 DIAGNOSIS — Z9861 Coronary angioplasty status: Secondary | ICD-10-CM

## 2018-04-15 DIAGNOSIS — R410 Disorientation, unspecified: Secondary | ICD-10-CM

## 2018-04-15 DIAGNOSIS — Z881 Allergy status to other antibiotic agents status: Secondary | ICD-10-CM

## 2018-04-15 DIAGNOSIS — I2510 Atherosclerotic heart disease of native coronary artery without angina pectoris: Secondary | ICD-10-CM | POA: Diagnosis present

## 2018-04-15 DIAGNOSIS — E785 Hyperlipidemia, unspecified: Secondary | ICD-10-CM | POA: Diagnosis present

## 2018-04-15 DIAGNOSIS — I6521 Occlusion and stenosis of right carotid artery: Secondary | ICD-10-CM | POA: Diagnosis not present

## 2018-04-15 DIAGNOSIS — Z9049 Acquired absence of other specified parts of digestive tract: Secondary | ICD-10-CM

## 2018-04-15 DIAGNOSIS — G459 Transient cerebral ischemic attack, unspecified: Secondary | ICD-10-CM | POA: Diagnosis not present

## 2018-04-15 DIAGNOSIS — Z8249 Family history of ischemic heart disease and other diseases of the circulatory system: Secondary | ICD-10-CM

## 2018-04-15 DIAGNOSIS — I482 Chronic atrial fibrillation: Secondary | ICD-10-CM | POA: Diagnosis present

## 2018-04-15 DIAGNOSIS — R4701 Aphasia: Secondary | ICD-10-CM | POA: Diagnosis present

## 2018-04-15 DIAGNOSIS — Z66 Do not resuscitate: Secondary | ICD-10-CM | POA: Diagnosis present

## 2018-04-15 DIAGNOSIS — I63432 Cerebral infarction due to embolism of left posterior cerebral artery: Principal | ICD-10-CM | POA: Diagnosis present

## 2018-04-15 DIAGNOSIS — I639 Cerebral infarction, unspecified: Secondary | ICD-10-CM | POA: Diagnosis not present

## 2018-04-15 DIAGNOSIS — I10 Essential (primary) hypertension: Secondary | ICD-10-CM | POA: Diagnosis present

## 2018-04-15 DIAGNOSIS — R29710 NIHSS score 10: Secondary | ICD-10-CM | POA: Diagnosis present

## 2018-04-15 DIAGNOSIS — I4891 Unspecified atrial fibrillation: Secondary | ICD-10-CM | POA: Diagnosis not present

## 2018-04-15 DIAGNOSIS — I63233 Cerebral infarction due to unspecified occlusion or stenosis of bilateral carotid arteries: Secondary | ICD-10-CM | POA: Diagnosis not present

## 2018-04-15 DIAGNOSIS — I6789 Other cerebrovascular disease: Secondary | ICD-10-CM | POA: Diagnosis not present

## 2018-04-15 DIAGNOSIS — I252 Old myocardial infarction: Secondary | ICD-10-CM

## 2018-04-15 DIAGNOSIS — I6529 Occlusion and stenosis of unspecified carotid artery: Secondary | ICD-10-CM | POA: Diagnosis not present

## 2018-04-15 DIAGNOSIS — I7121 Aneurysm of the ascending aorta, without rupture: Secondary | ICD-10-CM

## 2018-04-15 HISTORY — DX: Other specified neoplasm of uncertain behavior of connective and other soft tissue: D48.19

## 2018-04-15 HISTORY — DX: Cerebral infarction, unspecified: I63.9

## 2018-04-15 HISTORY — DX: Neoplasm of uncertain behavior of connective and other soft tissue: D48.1

## 2018-04-15 LAB — BASIC METABOLIC PANEL
ANION GAP: 7 (ref 5–15)
BUN: 13 mg/dL (ref 8–23)
CALCIUM: 9.2 mg/dL (ref 8.9–10.3)
CO2: 30 mmol/L (ref 22–32)
CREATININE: 0.93 mg/dL (ref 0.61–1.24)
Chloride: 103 mmol/L (ref 98–111)
GFR calc non Af Amer: >60 mL/min (ref >60)
Glucose, Bld: 167 mg/dL — ABNORMAL HIGH (ref 70–99)
Potassium: 4.7 mmol/L (ref 3.5–5.1)
SODIUM: 140 mmol/L (ref 135–145)

## 2018-04-15 LAB — HEPATIC FUNCTION PANEL
ALT: 30 U/L (ref 0–44)
AST: 26 U/L (ref 15–41)
Albumin: 3.7 g/dL (ref 3.5–5.0)
Alkaline Phosphatase: 72 U/L (ref 38–126)
BILIRUBIN TOTAL: 0.4 mg/dL (ref 0.3–1.2)
Total Protein: 6.8 g/dL (ref 6.5–8.1)

## 2018-04-15 LAB — URINALYSIS, COMPLETE (UACMP) WITH MICROSCOPIC
Bacteria, UA: NONE SEEN
Bilirubin Urine: NEGATIVE
GLUCOSE, UA: 150 mg/dL — AB
Hgb urine dipstick: NEGATIVE
KETONES UR: NEGATIVE mg/dL
Leukocytes, UA: NEGATIVE
NITRITE: NEGATIVE
PH: 5 (ref 5.0–8.0)
Protein, ur: NEGATIVE mg/dL
Specific Gravity, Urine: 1.019 (ref 1.005–1.030)
Squamous Epithelial / LPF: NONE SEEN (ref 0–5)

## 2018-04-15 LAB — CBC
HCT: 45.3 % (ref 40.0–52.0)
HEMOGLOBIN: 15.7 g/dL (ref 13.0–18.0)
MCH: 32.8 pg (ref 26.0–34.0)
MCHC: 34.7 g/dL (ref 32.0–36.0)
MCV: 94.5 fL (ref 80.0–100.0)
PLATELETS: 192 10*3/uL (ref 150–440)
RBC: 4.8 MIL/uL (ref 4.40–5.90)
RDW: 14 % (ref 11.5–14.5)
WBC: 7.7 10*3/uL (ref 3.8–10.6)

## 2018-04-15 LAB — TROPONIN I: Troponin I: <0.03 ng/mL (ref <0.03)

## 2018-04-15 LAB — PROTIME-INR
INR: 1
Prothrombin Time: 13.1 seconds (ref 11.4–15.2)

## 2018-04-15 MED ORDER — ACETAMINOPHEN 160 MG/5ML PO SOLN
650.0000 mg | ORAL | Status: DC | PRN
  Filled 2018-04-15: qty 20.3

## 2018-04-15 MED ORDER — ACETAMINOPHEN 500 MG PO TABS: 500.0000 mg | ORAL_TABLET | Freq: Four times a day (QID) | ORAL | Status: DC | PRN

## 2018-04-15 MED ORDER — AMLODIPINE BESYLATE 5 MG PO TABS
5.0000 mg | ORAL_TABLET | Freq: Every day | ORAL | Status: DC
  Administered 2018-04-16 – 2018-04-22 (×7): 5 mg via ORAL
  Filled 2018-04-15 (×7): qty 1

## 2018-04-15 MED ORDER — HEPARIN SODIUM (PORCINE) 5000 UNIT/ML IJ SOLN: 5000.0000 [IU] | Freq: Three times a day (TID) | INTRAMUSCULAR | Status: DC

## 2018-04-15 MED ORDER — QUETIAPINE FUMARATE 25 MG PO TABS: 25.0000 mg | ORAL_TABLET | Freq: Two times a day (BID) | ORAL | Status: DC | PRN

## 2018-04-15 MED ORDER — SODIUM CHLORIDE 0.9 % IV SOLN
INTRAVENOUS | Status: DC
  Administered 2018-04-15: 22:00:00 via INTRAVENOUS

## 2018-04-15 MED ORDER — FLUOXETINE HCL 10 MG PO CAPS
10.0000 mg | ORAL_CAPSULE | Freq: Every day | ORAL | Status: DC
  Administered 2018-04-16 – 2018-04-22 (×7): 10 mg via ORAL
  Filled 2018-04-15 (×7): qty 1

## 2018-04-15 MED ORDER — APIXABAN 5 MG PO TABS
5.0000 mg | ORAL_TABLET | Freq: Two times a day (BID) | ORAL | Status: DC
  Administered 2018-04-15 – 2018-04-22 (×14): 5 mg via ORAL
  Filled 2018-04-15 (×14): qty 1

## 2018-04-15 MED ORDER — DIAZEPAM 5 MG PO TABS
5.0000 mg | ORAL_TABLET | Freq: Once | ORAL | Status: AC
  Administered 2018-04-15: 22:00:00 5 mg via ORAL
  Filled 2018-04-15: qty 1

## 2018-04-15 MED ORDER — INSULIN ASPART 100 UNIT/ML ~~LOC~~ SOLN
0.0000 [IU] | Freq: Three times a day (TID) | SUBCUTANEOUS | Status: DC
  Administered 2018-04-16: 18:00:00 1 [IU] via SUBCUTANEOUS
  Administered 2018-04-16: 2 [IU] via SUBCUTANEOUS
  Administered 2018-04-16 – 2018-04-20 (×6): 1 [IU] via SUBCUTANEOUS
  Administered 2018-04-21: 2 [IU] via SUBCUTANEOUS
  Administered 2018-04-21: 1 [IU] via SUBCUTANEOUS
  Administered 2018-04-22: 2 [IU] via SUBCUTANEOUS
  Filled 2018-04-15 (×11): qty 1

## 2018-04-15 MED ORDER — FLUTICASONE PROPIONATE 50 MCG/ACT NA SUSP
1.0000 | Freq: Every day | NASAL | Status: DC
  Administered 2018-04-15 – 2018-04-21 (×7): 1 via NASAL
  Filled 2018-04-15: qty 16

## 2018-04-15 MED ORDER — METOPROLOL SUCCINATE ER 25 MG PO TB24
25.0000 mg | ORAL_TABLET | Freq: Every day | ORAL | Status: DC
  Administered 2018-04-16: 09:00:00 25 mg via ORAL
  Filled 2018-04-15: qty 1

## 2018-04-15 MED ORDER — DIVALPROEX SODIUM ER 500 MG PO TB24
500.0000 mg | ORAL_TABLET | Freq: Every day | ORAL | Status: DC
  Administered 2018-04-15 – 2018-04-21 (×7): 500 mg via ORAL
  Filled 2018-04-15 (×8): qty 1

## 2018-04-15 MED ORDER — STROKE: EARLY STAGES OF RECOVERY BOOK
Freq: Once | Status: AC
  Administered 2018-04-15: 22:00:00

## 2018-04-15 MED ORDER — ACETAMINOPHEN 325 MG PO TABS
650.0000 mg | ORAL_TABLET | ORAL | Status: DC | PRN
  Administered 2018-04-18: 10:00:00 650 mg via ORAL
  Filled 2018-04-15: qty 2

## 2018-04-15 MED ORDER — LORATADINE 10 MG PO TABS
10.0000 mg | ORAL_TABLET | Freq: Every day | ORAL | Status: DC
  Administered 2018-04-16 – 2018-04-22 (×7): 10 mg via ORAL
  Filled 2018-04-15 (×7): qty 1

## 2018-04-15 MED ORDER — ACETAMINOPHEN 650 MG RE SUPP: 650.0000 mg | RECTAL | Status: DC | PRN

## 2018-04-15 MED ORDER — PYRIDOSTIGMINE BROMIDE 60 MG PO TABS
60.0000 mg | ORAL_TABLET | Freq: Two times a day (BID) | ORAL | Status: DC
  Administered 2018-04-16: 60 mg via ORAL
  Filled 2018-04-15 (×2): qty 1

## 2018-04-15 MED ORDER — ATORVASTATIN CALCIUM 20 MG PO TABS
40.0000 mg | ORAL_TABLET | Freq: Every day | ORAL | Status: DC
  Administered 2018-04-16 – 2018-04-22 (×7): 40 mg via ORAL
  Filled 2018-04-15 (×7): qty 2

## 2018-04-15 MED ORDER — LATANOPROST 0.005 % OP SOLN
1.0000 [drp] | Freq: Every day | OPHTHALMIC | Status: DC
  Administered 2018-04-15 – 2018-04-21 (×7): 1 [drp] via OPHTHALMIC
  Filled 2018-04-15: qty 2.5

## 2018-04-15 MED ORDER — LORAZEPAM 2 MG/ML IJ SOLN: 1.0000 mg | Freq: Once | INTRAMUSCULAR | Status: DC

## 2018-04-15 MED ORDER — SENNOSIDES-DOCUSATE SODIUM 8.6-50 MG PO TABS: 1.0000 | ORAL_TABLET | Freq: Every evening | ORAL | Status: DC | PRN

## 2018-04-15 NOTE — ED Notes (Signed)
First Nurse Note:  Patient was sent to the ED from Dr. Bary Leriche office.  Patient has history of recent stroke that affected his right side.  Per family today, patient is more confused and weaker on the right side than normal.  Symptoms began, "some time during the night"  per family.

## 2018-04-15 NOTE — Consult Note (Signed)
Date: 04/15/2018  [facility] Tele[facility]urology Consult Services  Impression:  Acute Ischemic Stroke - L MCA stroke versus exaerbation of old stroke symptoms    Not a tpa candidate due to: symptoms began > 4.5 hours ago (on 04/14/2017 between 3-4 pm)  Given symptoms began > 24 hours ago, he is not thormbectomy candidate (per the DAWN trial) and therefore, advanced imaging deferred  Differential Diagnosis:   1. Cardioembolic stroke  2. Small vessel disease/lacune  3. Thromboembolic, artery-to-artery mechanism  4. Hypercoagulable state-related infarct  5. Transient ischemic attack  6. Thrombotic mechanism, large artery disease   Comments: Door Time: 1800 [facility] contacted: [phone] [facility] at bedside: 1837 NIHSS assessment start time (time the consultation begins):1840 Last known well time (LKW): yesterday 04/14/2018 between 3-4 pm  Recommendations:  1. Admit to stroke/telemetry 2. Head of bed flat 3. IV Fluids, NS 4. Aspirin if no contraindications 5. DVT prophylaxis 6. Dysphagia screen 7. Neuro checks 8. In house neurology consult.  9. Further stroke workup per in house neurology/internal medicine  Discussed with ED attending.  Please call [facility] if you have any questions: [phone]  -----------------------------------------------------------------------------------------  CC Confusion, difficulty speaking  History of Present Illness   Patient is a 84 year old man with history of stroke in April 2019 (with residual right sided vision loss, right sided weakness/numbness) who presents with confusion that began yesterday at 3-4 pm.  It has been constant since it began but then progressively worsened since yesterday until it the family decided to bring him in.  They stated that his speech was not making sense. He did have aphasia with his first stroke which resolved.  Diagnostic: CT head reviewed: subacute to chronic L PCA stroke per radiologist  (appears more chronic to me) and chronic left BG and thalamic strokes  Exam: Patient is in no apparent distress. Patient appears as stated age. No obvious acute respiratory or cardiac distress. Patient is well groomed and well-nourished.  1a- LOC: Keenly responsive - 0  1b- LOC questions: Answers both questions correctly - 1 (doesn't know age but knows the year he was born) 1c- LOC commands- Performs both tasks correctly- 0  2- Gaze: Normal; no gaze paresis or gaze deviation - 0  3- Visual Fields: 2 (R HH- chronic from old stroke in April) 4- Facial movements: 0  5- Upper limb motor - 1 (RUE- chronic from old stroke in April) 6- Lower limb motor - 2 (RLE- chronic from old stroke in April) 7- Limb Coordination: absent ataxia - 0  8- Sensory: 1 (decreased RLE/RUE- chronic from old stroke in April) 9- Language - 2  10- Speech - 0 11- Neglect - none found - 1 (right sided neglect- likely chronic from old stroke in [place])  NIHSS score: 10 (6 of these points are for old symptoms from prior stroke)    Medical Decision Making:  - Extensive number of diagnosis or management options are considered above. - Extensive amount of complex data reviewed. - High risk of complication and/or morbidity or mortality are associated with differential diagnostic considerations above.  - There may be Uncertain outcome and increased probability of prolonged functional impairment or high probability of severe prolonged functional impairment associated with some of these differential diagnosis.  Medical Data Reviewed:  1.Data reviewed include clinical labs, radiology,Medical Tests; 2.Tests results discussed w/performing or interpreting physician; 3.Obtaining/reviewing old medical records;  4.Obtaining case history from another source;  5.Independent review of image, tracing or specimen.   Patient was informed the Neurology  Consult would happen via TeleHealth consult by way of interactive audio and video  telecommunications and consented to receiving care in this manner.

## 2018-04-15 NOTE — Progress Notes (Signed)
Pre-visit discussion using our clinic review tool. No additional management support is needed unless otherwise documented below in the visit note.  

## 2018-04-15 NOTE — Progress Notes (Signed)
Family Meeting Note  Advance Directive:yes  Today a meeting took place with the Patient and spouse.  The following clinical team members were present during this meeting:MD  The following were discussed:Patient's diagnosis: stroke, CAD, DM, Htn, Patient's progosis: Unable to determine and Goals for treatment: Full Code  Additional follow-up to be provided: neurology  Time spent during discussion:20 minutes  Vaughan Basta, MD

## 2018-04-15 NOTE — H&P (Signed)
[facility] NAME: Kyle Richardson    MR#:  [id number]  DATE OF BIRTH:  Jul 07, 1928  DATE OF ADMISSION:  04/15/2018  PRIMARY CARE PHYSICIAN: Einar Pheasant, MD   REQUESTING/REFERRING PHYSICIAN: Mariea Clonts  CHIEF COMPLAINT:   Chief Complaint  Patient presents with  . Weakness    HISTORY OF PRESENT ILLNESS: Kyle Richardson  is a 84 y.o. male with a known history of stroke in April 2019- right sided weakness, sent to Acute rehab, sent home last week after good progress, with some residual weakness and numbness on right side and vision problem on right eye. Since yesterday wife noticed, he is more confused, not able to answer properly, have more weakness on right arm. So she brought him here. After consult with tele neurology- suggested to have full stroke work up.  PAST MEDICAL HISTORY:   Past Medical History:  Diagnosis Date  . Abdominal fibromatosis   . 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  s/p PTCA of the mid RCA 1/96, also  s/p  CABG x 4 7/96  . Degenerative disc disease   . Diabetes mellitus ([place])   . Glaucoma   . Hypercholesterolemia   . Hypertension   . Migraine headache    history of  . Osteoarthritis   . Sleep apnea   . Stroke [facility])     PAST SURGICAL HISTORY:  Past Surgical History:  Procedure Laterality Date  . APPENDECTOMY    . CHOLECYSTECTOMY     Removed  . CORONARY ARTERY BYPASS GRAFT  7/96   x4  . HEMORRHOID SURGERY    . HERNIA REPAIR    . UPP and tonsillectomy  1/86    SOCIAL HISTORY:  Social History   Tobacco Use  . Smoking status: Never Smoker  . Smokeless tobacco: Never Used  Substance Use Topics  . Alcohol use: No    Alcohol/week: 0.0 oz    FAMILY HISTORY:  Family History  Problem Relation Age of Onset  . Heart disease Mother        died age 64  . Heart disease Father        and other vascular disease  . Diabetes Unknown        four siblings  . Heart  disease Brother        s/p CABG  . Colon cancer Neg Hx   . Prostate cancer Neg Hx     DRUG ALLERGIES:  Allergies  Allergen Reactions  . Clarithromycin Other (See Comments) and Diarrhea  . Codeine   . Flomax [Tamsulosin Hcl]   . Lamisil [Terbinafine]   . Levofloxacin Other (See Comments)    Diplopia. NEVER put patient on levoquin!  . Sertraline Other (See Comments)  . Sulfa Antibiotics     REVIEW OF SYSTEMS:   CONSTITUTIONAL: No fever, fatigue or weakness.  EYES: No blurred or double vision.  EARS, NOSE, AND THROAT: No tinnitus or ear pain.  RESPIRATORY: No cough, shortness of breath, wheezing or hemoptysis.  CARDIOVASCULAR: No chest pain, orthopnea, edema.  GASTROINTESTINAL: No nausea, vomiting, diarrhea or abdominal pain.  GENITOURINARY: No dysuria, hematuria.  ENDOCRINE: No polyuria, nocturia,  HEMATOLOGY: No anemia, easy bruising or bleeding SKIN: No rash or lesion. MUSCULOSKELETAL: No joint pain or arthritis.   NEUROLOGIC: No tingling, numbness,right sided weakness.  PSYCHIATRY: No anxiety or depression.   MEDICATIONS AT HOME:  Prior to Admission medications   Medication Sig Start  Date End Date Taking? Authorizing Provider  acetaminophen (TYLENOL) 500 MG tablet Take 1 tablet (500 mg total) by mouth every 6 (six) hours as needed. 04/01/18  Yes Angiulli, Lavon Paganini, PA-C  apixaban (ELIQUIS) 5 MG TABS tablet Take 1 tablet (5 mg total) by mouth 2 (two) times daily. 04/01/18  Yes Angiulli, Lavon Paganini, PA-C  atorvastatin (LIPITOR) 40 MG tablet Take 1 tablet (40 mg total) by mouth daily. 04/01/18  Yes Angiulli, Lavon Paganini, PA-C  divalproex (DEPAKOTE ER) 500 MG 24 hr tablet Take 1 tablet (500 mg total) by mouth daily. 04/01/18  Yes Angiulli, Lavon Paganini, PA-C  FLUoxetine (PROZAC) 10 MG capsule Take 1 capsule (10 mg total) by mouth daily. 04/01/18  Yes Angiulli, Lavon Paganini, PA-C  fluticasone (FLONASE) 50 MCG/ACT nasal spray SPRAY 2 SPRAYS IN EACH NOSTRIL ONCE A DAY 02/20/18  Yes Einar Pheasant,  MD  latanoprost (XALATAN) 0.005 % ophthalmic solution Place 1 drop into both eyes at bedtime.   Yes [provider]  loratadine (CLARITIN) 10 MG tablet Take 10 mg by mouth daily.   Yes [provider]  metoprolol succinate (TOPROL-XL) 25 MG 24 hr tablet Take 1 tablet (25 mg total) by mouth daily. 04/01/18  Yes Angiulli, Lavon Paganini, PA-C  nitroGLYCERIN (NITROSTAT) 0.4 MG SL tablet Place 0.4 mg under the tongue every 5 (five) minutes as needed. As directed   Yes [provider]  amLODipine (NORVASC) 5 MG tablet Take 1 tablet (5 mg total) by mouth daily. Patient not taking: Reported on 04/15/2018 04/01/18   Angiulli, Lavon Paganini, PA-C  metFORMIN (GLUCOPHAGE-XR) 500 MG 24 hr tablet Take 1 tablet (500 mg total) by mouth daily with breakfast. Patient not taking: Reported on 04/15/2018 02/28/18   Fritzi Mandes, MD  pyridostigmine (MESTINON) 60 MG tablet Take 1 tablet (60 mg total) by mouth 2 (two) times daily. Patient not taking: Reported on 04/15/2018 02/27/18   Fritzi Mandes, MD  QUEtiapine (SEROQUEL) 25 MG tablet Take 1 tablet (25 mg total) by mouth 3 times/day as needed-between meals & bedtime (insomnia or agitation). Patient not taking: Reported on 04/15/2018 04/01/18   Angiulli, Lavon Paganini, PA-C      PHYSICAL EXAMINATION:   VITAL SIGNS: Blood pressure (!) 165/81, pulse 73, temperature 98 F (36.7 C), temperature source Oral, resp. rate 20, height 5\' 8"  (1.727 m), weight 72.6 kg (160 lb), SpO2 98 %.  GENERAL:  84 y.o.-year-old patient lying in the bed with no acute distress.  EYES: Pupils equal, round, reactive to light and accommodation. No scleral icterus. Extraocular muscles intact.  HEENT: Head atraumatic, normocephalic. Oropharynx and nasopharynx clear.  NECK:  Supple, no jugular venous distention. No thyroid enlargement, no tenderness.  LUNGS: Normal breath sounds bilaterally, no wheezing, rales,rhonchi or crepitation. No use of accessory muscles of respiration.  CARDIOVASCULAR: S1,  S2 normal. No murmurs, rubs, or gallops.  ABDOMEN: Soft, nontender, nondistended. Bowel sounds present. No organomegaly or mass.  EXTREMITIES: No pedal edema, cyanosis, or clubbing.  NEUROLOGIC: Cranial nerves II through XII are intact. Muscle strength 4/5 in rigjht extremities. Sensation intact. Gait not checked. Finger nose test is altered on right. PSYCHIATRIC: The patient is alert and oriented x 2. Could not tell month. He could not read, and could not name pictures on the neuro exam book. SKIN: No obvious rash, lesion, or ulcer.   LABORATORY PANEL:   CBC Recent Labs  Lab 04/15/18 1550  WBC 7.7  HGB 15.7  HCT 45.3  PLT 192  MCV 94.5  MCH 32.8  MCHC 34.7  RDW 14.0   ------------------------------------------------------------------------------------------------------------------  Chemistries  Recent Labs  Lab 04/15/18 1550  NA 140  K 4.7  CL 103  CO2 30  GLUCOSE 167*  BUN 13  CREATININE 0.93  CALCIUM 9.2  AST 26  ALT 30  ALKPHOS 72  BILITOT 0.4   ------------------------------------------------------------------------------------------------------------------ estimated creatinine clearance is 53.1 mL/min (by C-G formula based on SCr of 0.93 mg/dL). ------------------------------------------------------------------------------------------------------------------ No results for input(s): TSH, T4TOTAL, T3FREE, THYROIDAB in the last 72 hours.  Invalid input(s): FREET3   Coagulation profile Recent Labs  Lab 04/15/18 1550  INR 1.00   ------------------------------------------------------------------------------------------------------------------- No results for input(s): DDIMER in the last 72 hours. -------------------------------------------------------------------------------------------------------------------  Cardiac Enzymes Recent Labs  Lab 04/15/18 1550  TROPONINI <0.03    ------------------------------------------------------------------------------------------------------------------ Invalid input(s): POCBNP  ---------------------------------------------------------------------------------------------------------------  Urinalysis    Component Value Date/Time   COLORURINE YELLOW (A) 04/15/2018 1534   APPEARANCEUR CLEAR (A) 04/15/2018 1534   APPEARANCEUR Hazy 12/23/2012 0956   LABSPEC 1.019 04/15/2018 1534   LABSPEC 1.025 12/23/2012 0956   PHURINE 5.0 04/15/2018 1534   GLUCOSEU 150 (A) 04/15/2018 1534   GLUCOSEU Negative 12/23/2012 0956   HGBUR NEGATIVE 04/15/2018 1534   BILIRUBINUR NEGATIVE 04/15/2018 1534   BILIRUBINUR postivie 1+ 01/28/2018 0824   BILIRUBINUR Negative 12/23/2012 0956   KETONESUR NEGATIVE 04/15/2018 1534   PROTEINUR NEGATIVE 04/15/2018 1534   UROBILINOGEN 2.0 (A) 01/28/2018 0824   NITRITE NEGATIVE 04/15/2018 1534   LEUKOCYTESUR NEGATIVE 04/15/2018 1534   LEUKOCYTESUR Negative 12/23/2012 0956     RADIOLOGY: Dg Chest 2 View  Result Date: 04/15/2018 CLINICAL DATA:  Right-sided weakness. EXAM: CHEST - 2 VIEW COMPARISON:  Radiographs of March 29, 2018. FINDINGS: Stable cardiomediastinal silhouette. Status post coronary artery bypass graft. Atherosclerosis of thoracic aorta is noted. No pneumothorax or pleural effusion is noted. No acute pulmonary disease is noted. Bony thorax is unremarkable. IMPRESSION: No active cardiopulmonary disease. Aortic Atherosclerosis (ICD10-I70.0). Electronically Signed   By: Marijo Conception, M.D.   On: 04/15/2018 16:45   Ct Head Wo Contrast  Result Date: 04/15/2018 CLINICAL DATA:  Progressed confusion and weakness.  Recent stroke. EXAM: CT HEAD WITHOUT CONTRAST TECHNIQUE: Contiguous axial images were obtained from the base of the skull through the vertex without intravenous contrast. COMPARISON:  MRI of the head March 28, 2018 FINDINGS: BRAIN: No intraparenchymal hemorrhage, mass effect nor midline shift.  LEFT mesial occipital lobe encephalomalacia with mild ex vacuo dilatation LEFT atrium. Small area contiguous LEFT thalamus encephalomalacia. Old LEFT basal ganglia and LEFT thalamus lacunar infarcts. No global parenchymal brain volume loss for age. Patchy supratentorial white matter hypodensities less than expected for patient's age, though non-specific are most compatible with chronic small vessel ischemic disease. No acute large vascular territory infarcts. No abnormal extra-axial fluid collections. Basal cisterns are patent. VASCULAR: Severe calcific atherosclerosis of the carotid siphons. SKULL: No skull fracture. No significant scalp soft tissue swelling. SINUSES/ORBITS: Mild paranasal sinus mucosal thickening. Included mastoid air cells are well aerated.The included ocular globes and orbital contents are non-suspicious. Status post ocular lens implants. OTHER: None. IMPRESSION: 1. No acute intracranial process. 2. Subacute to chronic large LEFT PCA territory infarct. 3. Old LEFT basal ganglia and thalamus lacunar infarcts. Mild chronic small vessel ischemic changes. Electronically Signed   By: Elon Alas M.D.   On: 04/15/2018 16:31    EKG: Orders placed or performed during the hospital encounter of 04/15/18  . ED EKG within 10 minutes  . ED EKG within 10 minutes  IMPRESSION AND PLAN:  * TIA   Have worsening on symptoms, have some baseline weakness on right side due to past stroke   MRI, MRA, CT angio neck, Neuro and PT consult.   Echo was done in May 2019, no need to repeat.   Cont meds, and make changes- if neurologist suggest.  * Hyperlipidemia   Cont statin.  * Htn   Cont metoprolol, amlodipine  * A fib   Cont Eliquis.  * DM   Hold metformin, Cont on ISS>   * Myasthenia gravis    Cont mestinon  All the records are reviewed and case discussed with ED provider. Management plans discussed with the patient, family and they are in agreement.  CODE STATUS: Full. Code  Status History    Date Active Date Inactive Code Status Order ID Comments User Context   02/27/2018 1544 04/01/2018 1415 Full Code [id number]  Flora Lipps Inpatient   02/27/2018 1544 02/27/2018 1544 Full Code [id number]  Flora Lipps Inpatient   02/23/2018 0018 02/27/2018 1528 Full Code [id number]  Lance Coon, MD Inpatient     Discussed with wife, son and cousin in room.  TOTAL TIME TAKING CARE OF THIS PATIENT: 50 minutes.    Vaughan Basta M.D on 04/15/2018   Between 7am to 6pm - Pager - [phone]  After 6pm go to [email or web address] - password [email or web address] [facility]  Office  [phone]  CC: Primary care physician; Einar Pheasant, MD   Note: This dictation was prepared with Dragon dictation along with smaller phrase technology. Any transcriptional errors that result from this process are unintentional.

## 2018-04-15 NOTE — Assessment & Plan Note (Addendum)
Pt with recent CVA - admitted and discharged recently from rehab.  Was doing well.  Family noticed a change from yesterday to today.  Noticed more confused and not able to communicate as well.  Also noticed a change in his right arm and leg.  Not able to control as well.  Had the recent fall (out of his chair), but they report this was last week.  Unwitnessed.  He is able to answer questions today.  able to follow some commands, but family reports the change as outlined.  Given concern over this change, I do feel he warrants further evaluation with f/u scan, etc to confirm no new stroke, bleed, etc.  Pt and family agreeable with evaluation in ER.  ER notified.

## 2018-04-15 NOTE — ED Provider Notes (Signed)
[facility] Emergency Department Provider Note  ____________________________________________  Time seen: Approximately 5:59 PM  I have reviewed the triage vital signs and the nursing notes.   HISTORY  Chief Complaint Weakness    HPI Kyle Richardson is a 84 y.o. male discharged 5/22 after acute ischemic L PCA stroke w/ right homonymous hemianopsia and diplopia, residual right-sided weakness, and expressive was receptive aphasia, hx of paroxysmal A. fib on Eliquis, presenting for confusion and right-sided weakness.  The patient is unable to give much history due to his confusion and altered mental status.  The patient is companied by his wife and daughter, who state that for the past 4 days he has become increasingly confused.  Patient was discharged from rehab approximately 2 or 3 weeks ago and has had a progressively improving course with increasing right upper and lower extremities strength and normal mental status.  However, over the past 4 days the patient has become increasingly confused and is no longer oriented.  In addition, his expressive aphasia has become worse and he has started to have slurred speech.  This morning, his wife noted that he was unable to grasp his pills with his right hand, and that his right lower extremity was "heavy."  The patient has not had any changes in his medications, trauma, or any recent illness including cough or cold symptoms, fevers or chills, nausea vomiting or diarrhea.     Past Medical History:  Diagnosis Date  . 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  s/p PTCA of the mid RCA 1/96, also  s/p  CABG x 4 7/96  . Degenerative disc disease   . Diabetes mellitus ([place])   . Glaucoma   . Hypercholesterolemia   . Hypertension   . Migraine headache    history of  . Osteoarthritis   . Sleep apnea     Patient Active Problem List   Diagnosis Date Noted  . Episode of confusion 04/08/2018  .  Labile blood pressure   . Acute ischemic left PCA stroke ([place]) 02/27/2018  . Diabetes mellitus type 2 in nonobese (HCC)   . Glaucoma   . MG, ocular (myasthenia gravis) ([place])   . CVA (cerebral vascular accident) ([place]) 02/24/2018  . Right homonymous hemianopsia 02/23/2018  . Unresponsive episode 02/22/2018  . UTI (urinary tract infection) 01/28/2018  . Abnormal chest CT 09/20/2017  . Double vision 01/21/2017  . Ascending aortic aneurysm ([place]) 12/22/2016  . Blood-tinged sputum 10/23/2016  . Cough 10/23/2016  . PAF (paroxysmal atrial fibrillation) ([place]) 09/14/2016  . Health care maintenance 12/26/2014  . Stress 07/05/2014  . Migraine 05/10/2013  . Type 2 diabetes mellitus with complication, without long-term current use of insulin ([place]) 08/20/2012  . Hypertension 08/20/2012  . Hypercholesterolemia 08/20/2012  . CAD (coronary artery disease) 08/20/2012    Past Surgical History:  Procedure Laterality Date  . APPENDECTOMY    . CHOLECYSTECTOMY     Removed  . CORONARY ARTERY BYPASS GRAFT  7/96   x4  . HEMORRHOID SURGERY    . HERNIA REPAIR    . UPP and tonsillectomy  1/86    Current Outpatient Rx  . Order #: [id number] Class: Normal  . Order #: [id number] Class: Print  . Order #: [id number] Class: Print  . Order #: [id number] Class: Print  . Order #: [id number] Class: Historical Med  . Order #: [id number] Class: Print  . Order #: [id number] Class: Print  . Order #: [id number] Class: Normal  . Order #: [id number] Class:  Historical Med  . Order #: [id number] Class: Historical Med  . Order #: [id number] Class: Normal  . Order #: [id number] Class: Print  . Order #: [id number] Class: Historical Med  . Order #: [id number] Class: Historical Med  . Order #: [id number] Class: Historical Med  . Order #: [id number] Class: Normal  . Order #: [id number] Class: Print    Allergies Clarithromycin; Codeine; Flomax [tamsulosin hcl]; Lamisil [terbinafine]; Levofloxacin; Sertraline; and Sulfa antibiotics  Family History   Problem Relation Age of Onset  . Heart disease Mother        died age 15  . Heart disease Father        and other vascular disease  . Diabetes Unknown        four siblings  . Heart disease Brother        s/p CABG  . Colon cancer Neg Hx   . Prostate cancer Neg Hx     Social History Social History   Tobacco Use  . Smoking status: Never Smoker  . Smokeless tobacco: Never Used  Substance Use Topics  . Alcohol use: No    Alcohol/week: 0.0 oz  . Drug use: No    Review of Systems Constitutional: No fever/chills.  Lightheadedness or syncope.  No trauma. + Confusion. Eyes: No new visual changes.  ENT: No sore throat. No congestion or rhinorrhea. Cardiovascular: Denies chest pain. Denies palpitations. Respiratory: Denies shortness of breath.  No cough. Gastrointestinal: No abdominal pain.  No nausea, no vomiting.  No diarrhea.  No constipation. Genitourinary: Negative for dysuria. Musculoskeletal: Negative for back pain. Skin: Negative for rash. Neurological: Ossific infusion.  Positive for right upper extremity and right lower extremity weakness.  Positive for slurred speech.  Positive for expressive and receptive aphasia which is worse than baseline.    ____________________________________________   PHYSICAL EXAM:  VITAL SIGNS: ED Triage Vitals  Enc Vitals Group     BP 04/15/18 1533 (!) 165/81     Pulse Rate 04/15/18 1533 73     Resp 04/15/18 1533 20     Temp 04/15/18 1533 98 F (36.7 C)     Temp Source 04/15/18 1533 Oral     SpO2 04/15/18 1533 98 %     Weight 04/15/18 1534 160 lb (72.6 kg)     Height 04/15/18 1534 5\' 8"  (1.727 m)     Head Circumference --      Peak Flow --      Pain Score 04/15/18 1542 0     Pain Loc --      Pain Edu? --      Excl. in [place]? --     Constitutional: Patient is alert, makes good eye contact, but is not oriented; he does not know the year and thinks that it is December.  Chronically ill-appearing. Eyes: Conjunctivae are normal.   Pupils are 2 mm and reactive bilaterally.  Right lateral gaze deficit bilaterally.  No scleral icterus. Head: Atraumatic. Nose: No congestion/rhinnorhea. Mouth/Throat: Mucous membranes are moist.  Neck: No stridor.  Supple.  No JVD.  No meningismus. Cardiovascular: Normal rate, regular rhythm. No murmurs, rubs or gallops.  Respiratory: Normal respiratory effort.  No accessory muscle use or retractions. Lungs CTAB.  No wheezes, rales or ronchi. Gastrointestinal: Soft, nontender and nondistended.  No guarding or rebound.  No peritoneal signs. Musculoskeletal: No LE edema. Neurologic: Alert but not oriented.  The patient is intermittently able to comply with my neurologic examination.  With full effort, the patient does have 5 out of 5 bilateral grip strength,  biceps and triceps strength.  He is able to lift both legs but is not able to comply with downward force on my part.  He reports decreased sensation to light touch in the right lower extremity, right upper extremity, and right face.  His face and smile are symmetric and his tongue is midline.  His extraocular movements are compromised as above.  He has pupils that are equally round and reactive.  He has no obvious nystagmus.  He is unable to comply with cerebellar testing. Skin:  Skin is warm, dry and intact. No rash noted. Psychiatric: Mood and affect are normal. ____________________________________________   LABS (all labs ordered are listed, but only abnormal results are displayed)  Labs Reviewed  BASIC METABOLIC PANEL - Abnormal; Notable for the following components:      Result Value   Glucose, Bld 167 (*)    All other components within normal limits  URINALYSIS, COMPLETE (UACMP) WITH MICROSCOPIC - Abnormal; Notable for the following components:   Color, Urine YELLOW (*)    APPearance CLEAR (*)    Glucose, UA 150 (*)    All other components within normal limits  CBC  TROPONIN I  PROTIME-INR  HEPATIC FUNCTION PANEL    ____________________________________________  EKG  ED ECG REPORT I, Eula Listen, the attending physician, personally viewed and interpreted this ECG.   Date: 04/15/2018  EKG Time: 1552  Rate: 69  Rhythm: normal sinus rhythm; + PVC  Axis: normal  Intervals:none  ST&T Change: No STEMI  ____________________________________________  RADIOLOGY  Dg Chest 2 View  Result Date: 04/15/2018 CLINICAL DATA:  Right-sided weakness. EXAM: CHEST - 2 VIEW COMPARISON:  Radiographs of March 29, 2018. FINDINGS: Stable cardiomediastinal silhouette. Status post coronary artery bypass graft. Atherosclerosis of thoracic aorta is noted. No pneumothorax or pleural effusion is noted. No acute pulmonary disease is noted. Bony thorax is unremarkable. IMPRESSION: No active cardiopulmonary disease. Aortic Atherosclerosis (ICD10-I70.0). Electronically Signed   By: Marijo Conception, M.D.   On: 04/15/2018 16:45   Ct Head Wo Contrast  Result Date: 04/15/2018 CLINICAL DATA:  Progressed confusion and weakness.  Recent stroke. EXAM: CT HEAD WITHOUT CONTRAST TECHNIQUE: Contiguous axial images were obtained from the base of the skull through the vertex without intravenous contrast. COMPARISON:  MRI of the head March 28, 2018 FINDINGS: BRAIN: No intraparenchymal hemorrhage, mass effect nor midline shift. LEFT mesial occipital lobe encephalomalacia with mild ex vacuo dilatation LEFT atrium. Small area contiguous LEFT thalamus encephalomalacia. Old LEFT basal ganglia and LEFT thalamus lacunar infarcts. No global parenchymal brain volume loss for age. Patchy supratentorial white matter hypodensities less than expected for patient's age, though non-specific are most compatible with chronic small vessel ischemic disease. No acute large vascular territory infarcts. No abnormal extra-axial fluid collections. Basal cisterns are patent. VASCULAR: Severe calcific atherosclerosis of the carotid siphons. SKULL: No skull fracture. No  significant scalp soft tissue swelling. SINUSES/ORBITS: Mild paranasal sinus mucosal thickening. Included mastoid air cells are well aerated.The included ocular globes and orbital contents are non-suspicious. Status post ocular lens implants. OTHER: None. IMPRESSION: 1. No acute intracranial process. 2. Subacute to chronic large LEFT PCA territory infarct. 3. Old LEFT basal ganglia and thalamus lacunar infarcts. Mild chronic small vessel ischemic changes. Electronically Signed   By: Elon Alas M.D.   On: 04/15/2018 16:31    ____________________________________________   PROCEDURES  Procedure(s) performed: None  Procedures  Critical Care performed: Yes ____________________________________________   INITIAL IMPRESSION / ASSESSMENT AND PLAN / ED COURSE  Pertinent labs & imaging results that were available during my care of the patient were reviewed by me and considered in my medical decision making (see chart for details).  84 y.o. male with history of recent left PCA infarct, A. fib on Eliquis, presenting with worsening right sided weakness, new confusion and worsening expressive and receptive aphasia with slurred speech.  Overall, the patient is hypertensive to 165/81 but otherwise hemodynamically stable.  On my examination, he does have some neurologic abnormalities which are likely changed from his prior neurologic examination.  He does have 5 out of 5 right sided strength, but it appears that he may be having more difficulty with fine motor skills.  It is also possible that his weakness is waxing and waning peer I am concerned about acute CVA or acute on chronic worsening of his baseline deficiencies.  His laboratory studies are reassuring with a urinalysis that does not show UTI, normal electrolytes sent for mild hyperglycemia at 167 and reassuring blood counts.  The patient has no evidence of ischemia on his EKG and his troponin is negative.  The patient has an INR 1.00 today, he is  on Eliquis.  Other possible etiologies include pharmacologic's disturbance, some behavioral issues.  At this time, I have ordered an MRI of the brain and will admit the patient for continued evaluation and treatment.  CRITICAL CARE Performed by: Eula Listen   Total critical care time: 35 minutes  Critical care time was exclusive of separately billable procedures and treating other patients.  Critical care was necessary to treat or prevent imminent or life-threatening deterioration.  Critical care was time spent personally by me on the following activities: development of treatment plan with patient and/or surrogate as well as nursing, discussions with consultants, evaluation of patient's response to treatment, examination of patient, obtaining history from patient or surrogate, ordering and performing treatments and interventions, ordering and review of laboratory studies, ordering and review of radiographic studies, pulse oximetry and re-evaluation of patient's condition.  ____________________________________________  FINAL CLINICAL IMPRESSION(S) / ED DIAGNOSES  Final diagnoses:  Altered mental status, unspecified altered mental status type  Right sided weakness  Expressive aphasia  Receptive aphasia  Confusion  Slurred speech         NEW MEDICATIONS STARTED DURING THIS VISIT:  New Prescriptions   No medications on file      Eula Listen, MD 04/15/18 1818

## 2018-04-15 NOTE — ED Triage Notes (Signed)
Pt to triage via wheelchair.  Family reports pt had a stroke 4/19.  Pt has right side weakness from stroke.  Today, pt has increased weakness on the right side and seem more confused per family.   Pt denies headache.  Pt alert.

## 2018-04-15 NOTE — Progress Notes (Signed)
Patient ID: Kyle Richardson, male   DOB: 08-21-1928, 84 y.o.   MRN: [id number]   Subjective:    Patient ID: Kyle Richardson, male    DOB: 1928/08/30, 84 y.o.   MRN: [id number]  HPI  Patient here for hospital follow up.  He was recently admitted with acute CVA - left PCA territory which was felt to be embolic.  Left him with right sided weakness.  He has been in rehab at [facility] and was discharged home end of June.  In today for a f/u.  Had been improving, but wife and cousin who accompany him today report that he is different today.  Reports more confusion and report he is unable to pick up things on command with his right hand.  (he has been able to do this prior to today).  Also report that he does not have the control of his right leg today - like he has had prior.  Concern over this change.  Wife also reports pt fell out of his chair a few days ago.  Not sure if he hurt anything.  Deny any known specific injury from that fall, but was an unwitnessed fall.  Also unsure of day occurred, but think one one day last week.  No chest pain.  Eating.  No nausea or vomiting.  Bowels moving.     Past Medical History:  Diagnosis Date  . Abdominal fibromatosis   . 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  s/p PTCA of the mid RCA 1/96, also  s/p  CABG x 4 7/96  . Degenerative disc disease   . Diabetes mellitus ([place])   . Glaucoma   . Hypercholesterolemia   . Hypertension   . Migraine headache    history of  . Osteoarthritis   . Sleep apnea   . Stroke [facility])    Past Surgical History:  Procedure Laterality Date  . APPENDECTOMY    . CHOLECYSTECTOMY     Removed  . CORONARY ARTERY BYPASS GRAFT  7/96   x4  . HEMORRHOID SURGERY    . HERNIA REPAIR    . UPP and tonsillectomy  1/86   Family History  Problem Relation Age of Onset  . Heart disease Mother        died age 27  . Heart disease Father        and other vascular disease  . Diabetes Unknown        four  siblings  . Heart disease Brother        s/p CABG  . Colon cancer Neg Hx   . Prostate cancer Neg Hx    Social History   Socioeconomic History  . Marital status: Married    Spouse name: [name]  . Number of children: 2  . Years of education: Not on file  . Highest education level: Not on file  Occupational History  . Not on file  Social Needs  . Financial resource strain: Not hard at all  . Food insecurity:    Worry: Never true    Inability: Never true  . Transportation needs:    Medical: No    Non-medical: No  Tobacco Use  . Smoking status: Never Smoker  . Smokeless tobacco: Never Used  Substance and Sexual Activity  . Alcohol use: No    Alcohol/week: 0.0 oz  . Drug use: No  . Sexual activity: Not Currently  Lifestyle  . Physical activity:  Days per week: Patient refused    Minutes per session: Patient refused  . Stress: Not at all  Relationships  . Social connections:    Talks on phone: Patient refused    Gets together: Patient refused    Attends religious service: Patient refused    Active member of club or organization: Patient refused    Attends meetings of clubs or organizations: Patient refused    Relationship status: Patient refused  Other Topics Concern  . Not on file  Social History Narrative  . Not on file    No facility-administered encounter medications on file as of 04/15/2018.    Outpatient Encounter Medications as of 04/15/2018  Medication Sig  . acetaminophen (TYLENOL) 500 MG tablet Take 1 tablet (500 mg total) by mouth every 6 (six) hours as needed.  Marland Kitchen amLODipine (NORVASC) 5 MG tablet Take 1 tablet (5 mg total) by mouth daily. (Patient not taking: Reported on 04/15/2018)  . apixaban (ELIQUIS) 5 MG TABS tablet Take 1 tablet (5 mg total) by mouth 2 (two) times daily.  Marland Kitchen atorvastatin (LIPITOR) 40 MG tablet Take 1 tablet (40 mg total) by mouth daily.  . divalproex (DEPAKOTE ER) 500 MG 24 hr tablet Take 1 tablet (500 mg total) by mouth daily.  Marland Kitchen  FLUoxetine (PROZAC) 10 MG capsule Take 1 capsule (10 mg total) by mouth daily.  . fluticasone (FLONASE) 50 MCG/ACT nasal spray SPRAY 2 SPRAYS IN EACH NOSTRIL ONCE A DAY  . latanoprost (XALATAN) 0.005 % ophthalmic solution Place 1 drop into both eyes at bedtime.  Marland Kitchen loratadine (CLARITIN) 10 MG tablet Take 10 mg by mouth daily.  . metoprolol succinate (TOPROL-XL) 25 MG 24 hr tablet Take 1 tablet (25 mg total) by mouth daily.  . nitroGLYCERIN (NITROSTAT) 0.4 MG SL tablet Place 0.4 mg under the tongue every 5 (five) minutes as needed. As directed  . [DISCONTINUED] Calcium Citrate-Vitamin D 315-250 MG-UNIT TABS Take 2 tablets by mouth daily.  . [DISCONTINUED] Multiple Vitamins-Minerals (PRESERVISION AREDS 2) CAPS Take 1 tablet by mouth 2 (two) times daily.   . [DISCONTINUED] pyridostigmine (MESTINON) 60 MG tablet Take 1 tablet (60 mg total) by mouth 2 (two) times daily. (Patient not taking: Reported on 04/15/2018)  . [DISCONTINUED] QUEtiapine (SEROQUEL) 25 MG tablet Take 1 tablet (25 mg total) by mouth 3 times/day as needed-between meals & bedtime (insomnia or agitation). (Patient not taking: Reported on 04/15/2018)  . [DISCONTINUED] metFORMIN (GLUCOPHAGE-XR) 500 MG 24 hr tablet Take 1 tablet (500 mg total) by mouth daily with breakfast. (Patient not taking: Reported on 04/15/2018)  . [DISCONTINUED] Multiple Vitamin (MULTIVITAMIN) tablet Take 1 tablet by mouth daily.    Review of Systems  Constitutional: Negative for appetite change and unexpected weight change.  HENT: Negative for congestion and sinus pressure.   Respiratory: Negative for cough, chest tightness and shortness of breath.   Cardiovascular: Negative for chest pain, palpitations and leg swelling.  Gastrointestinal: Negative for abdominal pain, diarrhea, nausea and vomiting.  Genitourinary: Negative for difficulty urinating and dysuria.  Musculoskeletal: Negative for joint swelling and myalgias.  Skin: Negative for color change and rash.    Neurological: Negative for dizziness, light-headedness and headaches.       Has noticed some change today in fine motor hand movement.  Unable to pick up things this am.  Also noticed some decrease in control of his right leg.    Psychiatric/Behavioral: Negative for agitation and dysphoric mood.       Objective:    Physical  Exam  Constitutional: He appears well-developed and well-nourished. No distress.  HENT:  Nose: Nose normal.  Mouth/Throat: Oropharynx is clear and moist.  Neck: Neck supple.  Cardiovascular: Normal rate and regular rhythm.  Pulmonary/Chest: Breath sounds normal. No respiratory distress.  Abdominal: Soft. Bowel sounds are normal. There is no tenderness.  Musculoskeletal:  Able to grip - bilateral hands.  Right weaker.  Has to use left hand to lift right arm.  Increased discomfort in right lower calf with standing.    Lymphadenopathy:    He has no cervical adenopathy.  Skin: No rash noted. No erythema.  Psychiatric: He has a normal mood and affect. His behavior is normal.    BP 140/66 (BP Location: Left Arm, Patient Position: Sitting, Cuff Size: Normal)   Pulse 68   Temp (!) 97.5 F (36.4 C) (Oral)   Resp 18   SpO2 98%  Wt Readings from Last 3 Encounters:  04/15/18 162 lb 4.8 oz (73.6 kg)  03/27/18 160 lb 15 oz (73 kg)  02/22/18 175 lb (79.4 kg)     Lab Results  Component Value Date   WBC 7.7 04/15/2018   HGB 15.7 04/15/2018   HCT 45.3 04/15/2018   PLT 192 04/15/2018   GLUCOSE 167 (H) 04/15/2018   CHOL 159 04/16/2018   TRIG 288 (H) 04/16/2018   HDL 35 (L) 04/16/2018   LDLDIRECT 81.0 10/12/2017   LDLCALC 66 04/16/2018   ALT 30 04/15/2018   AST 26 04/15/2018   NA 140 04/15/2018   K 4.7 04/15/2018   CL 103 04/15/2018   CREATININE 0.93 04/15/2018   BUN 13 04/15/2018   CO2 30 04/15/2018   TSH 2.098 03/29/2018   PSA 0.66 05/05/2016   INR 1.00 04/15/2018   HGBA1C 6.8 (H) 04/16/2018   MICROALBUR 2.2 (H) 10/12/2017       Assessment & Plan:    Problem List Items Addressed This Visit    Acute ischemic left PCA stroke ([place])    Recently admitted and found to have left PCA stroke.  Was in rehab. Discharged home with home health.  Insurance denied continued PT.  With new symptoms now, refer back to ER for further evaluation and treatment.        Ascending aortic aneurysm ([place])    Has been followed by vascular.        CAD (coronary artery disease)    Followed by cardiology.  Continue risk factor modification.        CVA (cerebral vascular accident) ([place])    Pt with recent CVA - admitted and discharged recently from rehab.  Was doing well.  Family noticed a change from yesterday to today.  Noticed more confused and not able to communicate as well.  Also noticed a change in his right arm and leg.  Not able to control as well.  Had the recent fall (out of his chair), but they report this was last week.  Unwitnessed.  He is able to answer questions today.  able to follow some commands, but family reports the change as outlined.  Given concern over this change, I do feel he warrants further evaluation with f/u scan, etc to confirm no new stroke, bleed, etc.  Pt and family agreeable with evaluation in ER.  ER notified.        Diabetes mellitus type 2 in nonobese (HCC)    Low carb diet.  Follow met b and a1c.       Hypercholesterolemia    On lipitor.  Follow lipid panel and liver function tests.        Hypertension    Blood pressure as outlined.  Same medication regimen.  Follow.        PAF (paroxysmal atrial fibrillation) ([place])    On eliquis.  Followed by cardiology.           I spent 40 minutes with the patient and more than 50% of the time was spent in consultation regarding the above.  Time spent discussing with pt and family his recent hospitalization and current symptoms and concerns.  Time also spent discussing the need for further evaluation, treatment and need for referral to ER.    Einar Pheasant, MD

## 2018-04-16 ENCOUNTER — Encounter: Payer: Self-pay | Admitting: Radiology

## 2018-04-16 ENCOUNTER — Observation Stay: Payer: PPO

## 2018-04-16 DIAGNOSIS — I6523 Occlusion and stenosis of bilateral carotid arteries: Secondary | ICD-10-CM | POA: Diagnosis not present

## 2018-04-16 DIAGNOSIS — E119 Type 2 diabetes mellitus without complications: Secondary | ICD-10-CM | POA: Diagnosis not present

## 2018-04-16 DIAGNOSIS — I639 Cerebral infarction, unspecified: Secondary | ICD-10-CM

## 2018-04-16 DIAGNOSIS — R531 Weakness: Secondary | ICD-10-CM | POA: Diagnosis not present

## 2018-04-16 DIAGNOSIS — I4891 Unspecified atrial fibrillation: Secondary | ICD-10-CM | POA: Diagnosis not present

## 2018-04-16 DIAGNOSIS — I6789 Other cerebrovascular disease: Secondary | ICD-10-CM | POA: Diagnosis not present

## 2018-04-16 DIAGNOSIS — I63233 Cerebral infarction due to unspecified occlusion or stenosis of bilateral carotid arteries: Secondary | ICD-10-CM | POA: Diagnosis not present

## 2018-04-16 DIAGNOSIS — G459 Transient cerebral ischemic attack, unspecified: Secondary | ICD-10-CM | POA: Diagnosis not present

## 2018-04-16 DIAGNOSIS — I10 Essential (primary) hypertension: Secondary | ICD-10-CM | POA: Diagnosis not present

## 2018-04-16 LAB — GLUCOSE, CAPILLARY
GLUCOSE-CAPILLARY: 122 mg/dL — AB (ref 70–99)
GLUCOSE-CAPILLARY: 163 mg/dL — AB (ref 70–99)
Glucose-Capillary: 133 mg/dL — ABNORMAL HIGH (ref 70–99)
Glucose-Capillary: 149 mg/dL — ABNORMAL HIGH (ref 70–99)

## 2018-04-16 LAB — HEMOGLOBIN A1C
Hgb A1c MFr Bld: 6.8 % — ABNORMAL HIGH (ref 4.8–5.6)
Mean Plasma Glucose: 148.46 mg/dL

## 2018-04-16 LAB — LIPID PANEL
CHOL/HDL RATIO: 4.5 ratio
Cholesterol: 159 mg/dL (ref 0–200)
HDL: 35 mg/dL — ABNORMAL LOW (ref >40)
LDL CALC: 66 mg/dL (ref 0–99)
Triglycerides: 288 mg/dL — ABNORMAL HIGH (ref <150)
VLDL: 58 mg/dL — AB (ref 0–40)

## 2018-04-16 MED ORDER — IOHEXOL 350 MG/ML SOLN
75.0000 mL | Freq: Once | INTRAVENOUS | Status: AC | PRN
  Administered 2018-04-16: 01:00:00 75 mL via INTRAVENOUS

## 2018-04-16 MED ORDER — METOPROLOL SUCCINATE ER 25 MG PO TB24
12.5000 mg | ORAL_TABLET | Freq: Every day | ORAL | Status: DC
  Administered 2018-04-17 – 2018-04-22 (×4): 12.5 mg via ORAL
  Filled 2018-04-16 (×5): qty 1

## 2018-04-16 NOTE — Progress Notes (Signed)
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 Patient daughter Baker Janus most of the history, according to the impression noted to have more weakness on the right side, speech difficulties, unable to grasp is yesterday, patient and high has NIH stroke Scale was nine when he came.   CHIEF COMPLAINT:   Chief Complaint  Patient presents with  . Weakness    REVIEW OF SYSTEMS:   ROS  patient  Has slurred speech but denies any complaints CONSTITUTIONAL: No fever, fatigue or weakness.  EYES: No blurred or double vision.  EARS, NOSE, AND THROAT: No tinnitus or ear pain.  RESPIRATORY: No cough, shortness of breath, wheezing or hemoptysis.  CARDIOVASCULAR: No chest pain, orthopnea, edema.  GASTROINTESTINAL: No nausea, vomiting, diarrhea or abdominal pain.  GENITOURINARY: No dysuria, hematuria.  ENDOCRINE: No polyuria, nocturia,  HEMATOLOGY: No anemia, easy bruising or bleeding SKIN: No rash or lesion. MUSCULOSKELETAL: No joint pain or arthritis.   NEUROLOGIC: No tingling, numbness, weakness.  PSYCHIATRY: No anxiety or depression.   DRUG ALLERGIES:   Allergies  Allergen Reactions  . Clarithromycin Other (See Comments) and Diarrhea  . Codeine   . Flomax [Tamsulosin Hcl]   . Lamisil [Terbinafine]   . Levofloxacin Other (See Comments)    Diplopia. NEVER put patient on levoquin!  . Sertraline Other (See Comments)  . Sulfa Antibiotics     VITALS:  Blood pressure (!) 146/75, pulse 60, temperature 97.7 F (36.5 C), temperature source Oral, resp. rate 18, height 5\' 8"  (1.727 m), weight 73.6 kg (162 lb 4.8 oz), SpO2 99 %.  PHYSICAL EXAMINATION:  GENERAL:  84 y.o.-year-old patient  sitting in chair without distress and working with physical therapy. Has some slurred speech and not able to follow commands  EYES: Pupils equal, round, reactive to light and accommodation. No scleral icterus. Extraocular muscles intact.  HEENT: Head atraumatic, normocephalic. Oropharynx and nasopharynx clear.  NECK:  Supple, no jugular venous distention. No thyroid enlargement, no tenderness.  LUNGS: Normal breath sounds bilaterally, no wheezing, rales,rhonchi or crepitation. No use of accessory muscles of respiration.  CARDIOVASCULAR: S1, S2 normal. No murmurs, rubs, or gallops.  ABDOMEN: Soft, nontender, nondistended. Bowel sounds present. No organomegaly or mass.  EXTREMITIES: No pedal edema, cyanosis, or clubbing.  NEUROLOGIC: patient noted to have right facial droop, decreased hearing. Motor exam 5/5 upper, lower extremities on right and left. DTR t 2+ bilaterally., Patient sitting in the chair with physical therapy. Getting evaluation with physical therapy for gait.  PSYCHIATRIC: The patient is alert and oriented x 3. Slurred speech observed. SKIN: No obvious rash, lesion, or ulcer.    LABORATORY PANEL:   CBC Recent Labs  Lab 04/15/18 1550  WBC 7.7  HGB 15.7  HCT 45.3  PLT 192   ------------------------------------------------------------------------------------------------------------------  Chemistries  Recent Labs  Lab 04/15/18 1550  NA 140  K 4.7  CL 103  CO2 30  GLUCOSE 167*  BUN 13  CREATININE 0.93  CALCIUM 9.2  AST 26  ALT 30  ALKPHOS 72  BILITOT 0.4   ------------------------------------------------------------------------------------------------------------------  Cardiac Enzymes Recent Labs  Lab 04/15/18 1550  TROPONINI <0.03   ------------------------------------------------------------------------------------------------------------------  RADIOLOGY:  Dg Chest 2 View  Result Date: 04/15/2018 CLINICAL DATA:  Right-sided weakness. EXAM: CHEST  - 2 VIEW COMPARISON:  Radiographs of March 29, 2018. FINDINGS: Stable cardiomediastinal silhouette. Status post coronary artery bypass graft. Atherosclerosis of thoracic aorta is noted. No pneumothorax or pleural effusion is noted. No acute pulmonary disease is noted. Bony thorax is unremarkable. IMPRESSION: No active cardiopulmonary disease. Aortic Atherosclerosis (ICD10-I70.0). Electronically Signed   By: Marijo Conception, M.D.   On: 04/15/2018 16:45   Ct Head Wo Contrast  Result Date: 04/15/2018 CLINICAL DATA:  Progressed confusion and weakness.  Recent stroke. EXAM: CT HEAD WITHOUT CONTRAST TECHNIQUE: Contiguous axial images were obtained from the base of the skull through the vertex without intravenous contrast. COMPARISON:  MRI of the head March 28, 2018 FINDINGS: BRAIN: No intraparenchymal hemorrhage, mass effect nor midline shift. LEFT mesial occipital lobe encephalomalacia with mild ex vacuo dilatation LEFT atrium. Small area contiguous LEFT thalamus encephalomalacia. Old LEFT basal ganglia and LEFT thalamus lacunar infarcts. No global parenchymal brain volume loss for age. Patchy supratentorial white matter hypodensities less than expected for patient's age, though non-specific are most compatible with chronic small vessel ischemic disease. No acute large vascular territory infarcts. No abnormal extra-axial fluid collections. Basal cisterns are patent. VASCULAR: Severe calcific atherosclerosis of the carotid siphons. SKULL: No skull fracture. No significant scalp soft tissue swelling. SINUSES/ORBITS: Mild paranasal sinus mucosal thickening. Included mastoid air cells are well aerated.The included ocular globes and orbital contents are non-suspicious. Status post ocular lens implants. OTHER: None. IMPRESSION: 1. No acute intracranial process. 2. Subacute to chronic large LEFT PCA territory infarct. 3. Old LEFT basal ganglia and thalamus lacunar infarcts. Mild chronic small vessel ischemic changes.  Electronically Signed   By: Elon Alas M.D.   On: 04/15/2018 16:31   Ct Angio Neck W Or Wo Contrast  Result Date: 04/16/2018 CLINICAL DATA:  Initial evaluation for increased confusion and right-sided weakness, recent stroke. EXAM: CT ANGIOGRAPHY NECK TECHNIQUE: Multidetector CT imaging of the neck was performed using the standard protocol during bolus administration of intravenous contrast. Multiplanar CT image reconstructions and MIPs were obtained to evaluate the vascular anatomy. Carotid stenosis measurements (when applicable) are obtained utilizing NASCET criteria, using the distal internal carotid diameter as the denominator. CONTRAST:  14mL OMNIPAQUE IOHEXOL 350 MG/ML SOLN COMPARISON:  Prior ultrasound from 02/23/2018 FINDINGS: Aortic arch: Visualized ascending aorta dilated up to 4.4 cm in diameter. Sequelae of prior CABG partially visualized. Arch itself of relative normal caliber with normal branch pattern. Moderate atheromatous plaque within the visualized aorta and about the origin of the great vessels without hemodynamically significant stenosis. Short-segment stenosis of approximately 80% present at the proximal left subclavian artery just proximal to the takeoff of the left vertebral artery (series 8, image 152). Visualized subclavian arteries otherwise patent without significant stenosis. Right carotid system: Right common carotid artery tortuous proximally. Scattered eccentric calcified plaque throughout the right common carotid artery with stenosis of up to 70% by NASCET criteria (series 7, image 149). Complex irregular plaque about the proximal right ICA with resultant severe stenosis of up to 75% by NASCET criteria (series 7, image 195) and measures approximately 2 cm in length. Right ICA widely patent distally to the skull base without stenosis. Prominent vascular calcifications noted within the carotid siphons. At the bifurcation Left carotid system: Left common carotid artery tortuous  proximally. Scattered calcified plaque throughout the left common carotid artery without significant stenosis. Calcified plaque at the left bifurcation with associated stenosis of up to approximately 70% by NASCET criteria (series 7, image 181).  Left ICA widely patent distally to the circle-of-Willis without stenosis or occlusion. Atheromatous plaque noted within the left carotid siphon. Vertebral arteries: Both of the vertebral arteries arise from the subclavian arteries. Focal plaque at the origin of the vertebral arteries bilaterally with relatively mild stenosis. Vertebral arteries otherwise widely patent within the neck without stenosis or occlusion. Mild plaque within the V4 segments bilaterally without significant stenosis. Vertebral arteries are largely codominant. Skeleton: No acute osseus abnormality. No worrisome lytic or blastic osseous lesions. Moderate cervical spondylolysis noted at C6-7. Median sternotomy wires noted. Other neck: No acute soft tissue abnormality within the neck. No adenopathy. Salivary glands normal. Thyroid normal. Mild chronic maxillary sinusitis. Upper chest: Visualized upper chest and mediastinum demonstrates no acute abnormality. Coronary artery calcifications partially visualized. Scattered atelectatic changes noted within the visualized lungs. IMPRESSION: 1. Extensive atheromatous plaque about the carotid bifurcations/proximal ICAs bilaterally with associated stenosis of up to 75% on the right and 70% on the left. 2. Atheromatous narrowing of up to 70% within the right common carotid artery. 3. Widely patent vertebral arteries within the neck. 4. Short-segment 80% atheromatous stenosis involving the proximal left subclavian artery. 5. Dilatation of the ascending aorta up to 4.4 cm in diameter. Recommend annual imaging followup by CTA or MRA. This recommendation follows 2010 ACCF/AHA/AATS/ACR/ASA/SCA/SCAI/SIR/STS/SVM Guidelines for the Diagnosis and Management of Patients with  Thoracic Aortic Disease. Circulation. 2010; 121: [id number] Electronically Signed   By: Jeannine Boga M.D.   On: 04/16/2018 02:32   Mr Brain Wo Contrast  Result Date: 04/16/2018 CLINICAL DATA:  Initial evaluation for increased confusion, worsened right arm weakness. Recent stroke. EXAM: MRI HEAD WITHOUT CONTRAST MRA HEAD WITHOUT CONTRAST TECHNIQUE: Multiplanar, multiecho pulse sequences of the brain and surrounding structures were obtained without intravenous contrast. Angiographic images of the head were obtained using MRA technique without contrast. COMPARISON:  Prior CT from earlier the same day as well as previous MRI from 02/23/2018. FINDINGS: MRI HEAD FINDINGS Brain: Generalized age-related cerebral atrophy with chronic microvascular ischemic disease again noted, stable. There has been interval evolution of a large left PCA territory infarct, late subacute to chronic in appearance. Overall, size of left PCA territory infarct is increased relative to previous MRI, but is chronic in appearance. Developing encephalomalacia with laminar necrosis seen within the area of infarction. No evidence for hemorrhagic transformation. No significant mass effect. 8 mm acute ischemic cortical infarct seen at the posterior right temporal lobe (series 100, image 28). No associated hemorrhage or mass effect. No other evidence for acute or interval infarct. No evidence for acute or chronic intracranial hemorrhage. No mass lesion, midline shift or mass effect. No hydrocephalus. No extra-axial fluid collection. Pituitary gland within normal limits. Vascular: Major intracranial vascular flow voids are maintained. Skull and upper cervical spine: Craniocervical junction normal. Bone marrow signal intensity within normal limits. No scalp soft tissue abnormality. Sinuses/Orbits: Globes and orbital soft tissues within normal limits. Patient status post ocular lens replacement bilaterally. Small maxillary sinus retention cysts  noted paranasal sinuses are otherwise clear. No mastoid effusion. Inner ear structures normal. Other: None. MRA HEAD FINDINGS ANTERIOR CIRCULATION: Distal cervical segments of the internal carotid arteries are patent with antegrade flow. Petrous segments patent bilaterally. Scattered atheromatous irregularity within the cavernous/supraclinoid ICAs without hemodynamically significant stenosis. A1 segments patent bilaterally. Left A1 hypoplastic, accounting for the diminutive left ICA is compared to the right. Normal anterior communicating artery. Anterior cerebral arteries patent to their distal aspects without stenosis M1 segments patent without stenosis. Normal MCA bifurcations.  Distal MCA branches well perfused and symmetric POSTERIOR CIRCULATION: Vertebral arteries patent to the vertebrobasilar junction without stenosis. Patent right PICA. Left PICA not visualized. Dominant left AICA. Basilar artery widely patent to its distal aspect. basilar patent to its distal aspect without stenosis. Superior cerebral arteries patent bilaterally. Both of the posterior cerebral artery supplied via the basilar. Chronic occlusion of the proximal left PCA with minimal thready flow distally. Right PCA widely patent to its distal aspect without proximal high-grade or correctable stenosis. IMPRESSION: MRI HEAD IMPRESSION: 1. 8 mm acute ischemic nonhemorrhagic right temporal cortical infarct. 2. Interval evolution of large left PCA territory infarct, now late subacute to chronic in appearance. 3. Stable age-related cerebral atrophy with mild chronic small vessel ischemic disease. MRA HEAD IMPRESSION: 1. Chronic proximal left P2 occlusion. 2. Otherwise negative intracranial MRA with relatively minor atherosclerotic change for age. No other proximal high-grade or correctable stenosis identified. Electronically Signed   By: Jeannine Boga M.D.   On: 04/16/2018 01:12   Mr Jodene Nam Head/brain XL Cm  Result Date: 04/16/2018 CLINICAL  DATA:  Initial evaluation for increased confusion, worsened right arm weakness. Recent stroke. EXAM: MRI HEAD WITHOUT CONTRAST MRA HEAD WITHOUT CONTRAST TECHNIQUE: Multiplanar, multiecho pulse sequences of the brain and surrounding structures were obtained without intravenous contrast. Angiographic images of the head were obtained using MRA technique without contrast. COMPARISON:  Prior CT from earlier the same day as well as previous MRI from 02/23/2018. FINDINGS: MRI HEAD FINDINGS Brain: Generalized age-related cerebral atrophy with chronic microvascular ischemic disease again noted, stable. There has been interval evolution of a large left PCA territory infarct, late subacute to chronic in appearance. Overall, size of left PCA territory infarct is increased relative to previous MRI, but is chronic in appearance. Developing encephalomalacia with laminar necrosis seen within the area of infarction. No evidence for hemorrhagic transformation. No significant mass effect. 8 mm acute ischemic cortical infarct seen at the posterior right temporal lobe (series 100, image 28). No associated hemorrhage or mass effect. No other evidence for acute or interval infarct. No evidence for acute or chronic intracranial hemorrhage. No mass lesion, midline shift or mass effect. No hydrocephalus. No extra-axial fluid collection. Pituitary gland within normal limits. Vascular: Major intracranial vascular flow voids are maintained. Skull and upper cervical spine: Craniocervical junction normal. Bone marrow signal intensity within normal limits. No scalp soft tissue abnormality. Sinuses/Orbits: Globes and orbital soft tissues within normal limits. Patient status post ocular lens replacement bilaterally. Small maxillary sinus retention cysts noted paranasal sinuses are otherwise clear. No mastoid effusion. Inner ear structures normal. Other: None. MRA HEAD FINDINGS ANTERIOR CIRCULATION: Distal cervical segments of the internal carotid  arteries are patent with antegrade flow. Petrous segments patent bilaterally. Scattered atheromatous irregularity within the cavernous/supraclinoid ICAs without hemodynamically significant stenosis. A1 segments patent bilaterally. Left A1 hypoplastic, accounting for the diminutive left ICA is compared to the right. Normal anterior communicating artery. Anterior cerebral arteries patent to their distal aspects without stenosis M1 segments patent without stenosis. Normal MCA bifurcations. Distal MCA branches well perfused and symmetric POSTERIOR CIRCULATION: Vertebral arteries patent to the vertebrobasilar junction without stenosis. Patent right PICA. Left PICA not visualized. Dominant left AICA. Basilar artery widely patent to its distal aspect. basilar patent to its distal aspect without stenosis. Superior cerebral arteries patent bilaterally. Both of the posterior cerebral artery supplied via the basilar. Chronic occlusion of the proximal left PCA with minimal thready flow distally. Right PCA widely patent to its distal aspect without proximal high-grade  or correctable stenosis. IMPRESSION: MRI HEAD IMPRESSION: 1. 8 mm acute ischemic nonhemorrhagic right temporal cortical infarct. 2. Interval evolution of large left PCA territory infarct, now late subacute to chronic in appearance. 3. Stable age-related cerebral atrophy with mild chronic small vessel ischemic disease. MRA HEAD IMPRESSION: 1. Chronic proximal left P2 occlusion. 2. Otherwise negative intracranial MRA with relatively minor atherosclerotic change for age. No other proximal high-grade or correctable stenosis identified. Electronically Signed   By: Jeannine Boga M.D.   On: 04/16/2018 01:12    EKG:   Orders placed or performed during the hospital encounter of 04/15/18  . ED EKG within 10 minutes  . ED EKG within 10 minutes    ASSESSMENT AND PLAN:  84 year old patient with history of previous left PC infarct with the right side weakness  comes in with worsening rights that weakness, start speech.  1. Acte right temporal lobe infarct;, previous left PC infarct likely embolic stroke; but patient already on Eliquis. Continue eliquis statins, monitor on telemetry, families requesting cardiology, vascular consult.  2.chronic atrial fibrillation: rate controlled continue  Eliquis,metoprolol cardiology consult with Dr.  Enrique Sack as per family request.   3.patient has bilateral carotid stenosis stenosis with more than 70% block, 80% at the stenosis involving proximal left subclavian: vascular consult placed.   more Than 50% time spent in counseling, coordination of care.  All the records are reviewed and case discussed with Care Management/Social Workerr. Management plans discussed with the patient, family and they are in agreement.  CODE STATUS: DNR  TOTAL TIME TAKING CARE OF THIS PATIENT: 66minutes.   POSSIBLE D/C IN 1-2DAYS, DEPENDING ON CLINICAL CONDITION.   Epifanio Lesches M.D on 04/16/2018 at 2:09 PM  Between 7am to 6pm - Pager - [phone]  After 6pm go to [email or web address] - password [email or web address] [facility]  Office  [phone]  CC: Primary care physician; Einar Pheasant, MD   Note: This dictation was prepared with Dragon dictation along with smaller phrase technology. Any transcriptional errors that result from this process are unintentional.

## 2018-04-16 NOTE — Consult Note (Signed)
[facility] Vascular Consult Note  MRN : [id number]  Kyle Richardson is a 84 y.o. (06-24-1928) male who presents with chief complaint of  Chief Complaint  Patient presents with  . Weakness  .  History of Present Illness:  I am asked to evaluate the patient by Dr. Vianne Bulls for carotid stenosis.  The patient is an 84 year old gentleman who presented to [facility] yesterday.  He was brought to the emergency room by family secondary to increasing confusion as well as an increase in the weakness of his right arm and right eye visual changes.  The patient and family deny any left-sided weakness.  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  Per family they noticed increased confusion over several days and then yesterday the increased weakness which prompted his hospital encounter.  Patient does have atrial fibrillation and is currently taking Eliquis.  He has not missed any doses.  Patient denies any recent trauma.  No syncope.  No onset of headache.  Work-up since admission has included a CT Angie of the neck.  I have personally reviewed the scan and concur there is a 75 to 80% stenosis at the origin of the right internal carotid artery this is also associated with a tandem 70% lesion in the distal common carotid artery on the right.  On the left there is a 70% stenosis at the origin of the left internal carotid artery.  MRI was performed which demonstrates an acute right temporal infarct.  Current Facility-Administered Medications  Medication Dose Route Frequency Provider Last Rate Last Dose  . acetaminophen (TYLENOL) tablet 650 mg  650 mg Oral Q4H PRN Vaughan Basta, MD       Or  . acetaminophen (TYLENOL) solution 650 mg  650 mg Per Tube Q4H PRN Vaughan Basta, MD       Or  . acetaminophen (TYLENOL) suppository 650 mg   650 mg Rectal Q4H PRN Vaughan Basta, MD      . amLODipine (NORVASC) tablet 5 mg  5 mg Oral Daily Vaughan Basta, MD   5 mg at 04/16/18 0903  . apixaban (ELIQUIS) tablet 5 mg  5 mg Oral BID Vaughan Basta, MD   5 mg at 04/16/18 2054  . atorvastatin (LIPITOR) tablet 40 mg  40 mg Oral Daily Vaughan Basta, MD   40 mg at 04/16/18 0903  . divalproex (DEPAKOTE ER) 24 hr tablet 500 mg  500 mg Oral QHS Vaughan Basta, MD   500 mg at 04/16/18 2054  . FLUoxetine (PROZAC) capsule 10 mg  10 mg Oral Daily Vaughan Basta, MD   10 mg at 04/16/18 0903  . fluticasone (FLONASE) 50 MCG/ACT nasal spray 1 spray  1 spray Each Nare QHS Vaughan Basta, MD   1 spray at 04/16/18 2055  . insulin aspart (novoLOG) injection 0-9 Units  0-9 Units Subcutaneous TID WC Vaughan Basta, MD   1 Units at 04/16/18 1754  . latanoprost (XALATAN) 0.005 % ophthalmic solution 1 drop  1 drop Both Eyes QHS Vaughan Basta, MD   1 drop at 04/16/18 2055  . loratadine (CLARITIN) tablet 10 mg  10 mg Oral Daily Vaughan Basta, MD   10 mg at 04/16/18 0903  . [START ON 04/17/2018] metoprolol succinate (TOPROL-XL) 24 hr tablet 12.5 mg  12.5 mg Oral Daily Epifanio Lesches, MD      . senna-docusate (Senokot-S)  tablet 1 tablet  1 tablet Oral QHS PRN Vaughan Basta, MD        Past Medical History:  Diagnosis Date  . Abdominal fibromatosis   . 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  s/p PTCA of the mid RCA 1/96, also  s/p  CABG x 4 7/96  . Degenerative disc disease   . Diabetes mellitus ([place])   . Glaucoma   . Hypercholesterolemia   . Hypertension   . Migraine headache    history of  . Osteoarthritis   . Sleep apnea   . Stroke [facility])     Past Surgical History:  Procedure Laterality Date  . APPENDECTOMY    . CHOLECYSTECTOMY     Removed  . CORONARY ARTERY BYPASS GRAFT  7/96   x4  . HEMORRHOID SURGERY    . HERNIA  REPAIR    . UPP and tonsillectomy  1/86    Social History Social History   Tobacco Use  . Smoking status: Never Smoker  . Smokeless tobacco: Never Used  Substance Use Topics  . Alcohol use: No    Alcohol/week: 0.0 oz  . Drug use: No    Family History Family History  Problem Relation Age of Onset  . Heart disease Mother        died age 101  . Heart disease Father        and other vascular disease  . Diabetes Unknown        four siblings  . Heart disease Brother        s/p CABG  . Colon cancer Neg Hx   . Prostate cancer Neg Hx   No family history of bleeding/clotting disorders, porphyria or autoimmune disease   Allergies  Allergen Reactions  . Clarithromycin Other (See Comments) and Diarrhea  . Codeine   . Flomax [Tamsulosin Hcl]   . Lamisil [Terbinafine]   . Levofloxacin Other (See Comments)    Diplopia. NEVER put patient on levoquin!  . Sertraline Other (See Comments)  . Sulfa Antibiotics      REVIEW OF SYSTEMS (Negative unless checked)  Constitutional: [] Weight loss  [] Fever  [] Chills Cardiac: [] Chest pain   [] Chest pressure   [] Palpitations   [] Shortness of breath when laying flat   [] Shortness of breath at rest   [] Shortness of breath with exertion. Vascular:  [] Pain in legs with walking   [] Pain in legs at rest   [] Pain in legs when laying flat   [] Claudication   [] Pain in feet when walking  [] Pain in feet at rest  [] Pain in feet when laying flat   [] History of DVT   [] Phlebitis   [] Swelling in legs   [] Varicose veins   [] Non-healing ulcers Pulmonary:   [] Uses home oxygen   [] Productive cough   [] Hemoptysis   [] Wheeze  [] COPD   [] Asthma Neurologic:  [] Dizziness  [] Blackouts   [] Seizures   [x] History of stroke   [] History of TIA  [] Aphasia   [] Temporary blindness   [] Dysphagia   [x] Weakness or numbness in arms   [] Weakness or numbness in legs Musculoskeletal:  [] Arthritis   [] Joint swelling   [] Joint pain   [] Low back pain Hematologic:  [] Easy bruising  [] Easy  bleeding   [] Hypercoagulable state   [] Anemic  [] Hepatitis Gastrointestinal:  [] Blood in stool   [] Vomiting blood  [] Gastroesophageal reflux/heartburn   [] Difficulty swallowing. Genitourinary:  [] Chronic kidney disease   [] Difficult urination  [] Frequent urination  [] Burning with urination   []   Blood in urine Skin:  [] Rashes   [] Ulcers   [] Wounds Psychological:  [] History of anxiety   []  History of major depression.  Physical Examination  Vitals:   04/16/18 0731 04/16/18 0930 04/16/18 1230 04/16/18 1730  BP: (!) 172/59 (!) 146/75 140/83 127/69  Pulse: (!) 50 60 85 61  Resp: 16 18 20 18   Temp: 97.7 F (36.5 C)  (!) 97.3 F (36.3 C) 98.2 F (36.8 C)  TempSrc: Oral  Oral Oral  SpO2: 98% 99% 98% 97%  Weight:      Height:       Body mass index is 24.68 kg/m. Gen:  WD/WN, NAD Head: [place]/AT, No temporalis wasting. Prominent temp pulse not noted. Ear/Nose/Throat: Hearing grossly intact, nares w/o erythema or drainage, oropharynx w/o Erythema/Exudate Eyes: Sclera non-icteric, conjunctiva clear Neck: Trachea midline.  No JVD.  Pulmonary:  Good air movement, respirations not labored, equal bilaterally.  Cardiac: RRR, normal S1, S2. Vascular: Bilateral carotid bruits Vessel Right Left  Radial Palpable Palpable  Gastrointestinal: soft, non-tender/non-distended. No guarding/reflex.  Musculoskeletal: M/S 4/5 right arm 5 out of 5 throughout the remaining extremities.  Extremities without ischemic changes.  No deformity or atrophy. No edema. Neurologic: Sensation grossly intact in extremities.  Symmetrical.  Speech is not fluent. Motor exam as listed above. Psychiatric: Judgment intact, Mood & affect appropriate for pt's clinical situation. Dermatologic: No rashes or ulcers noted.  No cellulitis or open wounds. Lymph : No Cervical, Axillary, or Inguinal lymphadenopathy.      CBC Lab Results  Component Value Date   WBC 7.7 04/15/2018   HGB 15.7 04/15/2018   HCT 45.3 04/15/2018   MCV  94.5 04/15/2018   PLT 192 04/15/2018    BMET    Component Value Date/Time   NA 140 04/15/2018 1550   NA 140 03/23/2014 2024   K 4.7 04/15/2018 1550   K 3.8 03/23/2014 2024   CL 103 04/15/2018 1550   CL 104 03/23/2014 2024   CO2 30 04/15/2018 1550   CO2 27 03/23/2014 2024   GLUCOSE 167 (H) 04/15/2018 1550   GLUCOSE 156 04/16/2015 1220   BUN 13 04/15/2018 1550   BUN 13 03/23/2014 2024   CREATININE 0.93 04/15/2018 1550   CREATININE 0.97 03/23/2014 2024   CALCIUM 9.2 04/15/2018 1550   CALCIUM 8.7 03/23/2014 2024   GFRNONAA >60 04/15/2018 1550   GFRNONAA >60 03/23/2014 2024   GFRAA >60 04/15/2018 1550   GFRAA >60 03/23/2014 2024   Estimated Creatinine Clearance: 53.1 mL/min (by C-G formula based on SCr of 0.93 mg/dL).  COAG Lab Results  Component Value Date   INR 1.00 04/15/2018   INR 1.12 02/22/2018   INR 1.4 02/17/2014    Radiology Dg Chest 2 View  Result Date: 04/15/2018 CLINICAL DATA:  Right-sided weakness. EXAM: CHEST - 2 VIEW COMPARISON:  Radiographs of March 29, 2018. FINDINGS: Stable cardiomediastinal silhouette. Status post coronary artery bypass graft. Atherosclerosis of thoracic aorta is noted. No pneumothorax or pleural effusion is noted. No acute pulmonary disease is noted. Bony thorax is unremarkable. IMPRESSION: No active cardiopulmonary disease. Aortic Atherosclerosis (ICD10-I70.0). Electronically Signed   By: Marijo Conception, M.D.   On: 04/15/2018 16:45   Dg Chest 2 View  Result Date: 03/29/2018 CLINICAL DATA:  Pneumonia. EXAM: CHEST - 2 VIEW COMPARISON:  03/28/2018 FINDINGS: Sequelae of prior CABG are again identified. The cardiomediastinal silhouette is unchanged with normal heart size. The lungs are hypoinflated with minimal bibasilar atelectasis, improved from prior. No pleural effusion  or pneumothorax is identified. No acute osseous abnormality is seen. IMPRESSION: Hypoinflation with minimal bibasilar atelectasis. Electronically Signed   By: Logan Bores  M.D.   On: 03/29/2018 14:41   Dg Chest 2 View  Result Date: 03/28/2018 CLINICAL DATA:  Confusion.  Disorientation. EXAM: CHEST - 2 VIEW COMPARISON:  03/01/2018. FINDINGS: Prior CABG. Heart size normal. Normal pulmonary vascularity. Low lung volumes with mild bibasilar atelectasis/infiltrates. No pleural effusion or pneumothorax. IMPRESSION: 1.  Prior CABG.  Heart size normal. 2.  Low lung volumes with mild bibasilar atelectasis/infiltrates. Electronically Signed   By: Marcello Moores  Register   On: 03/28/2018 13:11   Ct Head Wo Contrast  Result Date: 04/15/2018 CLINICAL DATA:  Progressed confusion and weakness.  Recent stroke. EXAM: CT HEAD WITHOUT CONTRAST TECHNIQUE: Contiguous axial images were obtained from the base of the skull through the vertex without intravenous contrast. COMPARISON:  MRI of the head March 28, 2018 FINDINGS: BRAIN: No intraparenchymal hemorrhage, mass effect nor midline shift. LEFT mesial occipital lobe encephalomalacia with mild ex vacuo dilatation LEFT atrium. Small area contiguous LEFT thalamus encephalomalacia. Old LEFT basal ganglia and LEFT thalamus lacunar infarcts. No global parenchymal brain volume loss for age. Patchy supratentorial white matter hypodensities less than expected for patient's age, though non-specific are most compatible with chronic small vessel ischemic disease. No acute large vascular territory infarcts. No abnormal extra-axial fluid collections. Basal cisterns are patent. VASCULAR: Severe calcific atherosclerosis of the carotid siphons. SKULL: No skull fracture. No significant scalp soft tissue swelling. SINUSES/ORBITS: Mild paranasal sinus mucosal thickening. Included mastoid air cells are well aerated.The included ocular globes and orbital contents are non-suspicious. Status post ocular lens implants. OTHER: None. IMPRESSION: 1. No acute intracranial process. 2. Subacute to chronic large LEFT PCA territory infarct. 3. Old LEFT basal ganglia and thalamus lacunar  infarcts. Mild chronic small vessel ischemic changes. Electronically Signed   By: Elon Alas M.D.   On: 04/15/2018 16:31   Ct Head Wo Contrast  Result Date: 03/28/2018 CLINICAL DATA:  Stroke.  Confusion EXAM: CT HEAD WITHOUT CONTRAST TECHNIQUE: Contiguous axial images were obtained from the base of the skull through the vertex without intravenous contrast. COMPARISON:  MRI 02/23/2018, CT 02/22/2018 FINDINGS: Brain: Acute infarct left PCA territory. Hypodensity in the left medial temporal lobe extending into the occipital lobe. Left thalamus involved. There has been progression of infarct since the prior MRI which has extended to the occipital lobe. Small area of petechial hemorrhage has developed in the left occipital lobe measuring less than 1 cm Generalized atrophy. Mild chronic microvascular ischemic change in the white matter. Negative for mass lesion. Vascular: Negative for hyperdense vessel Skull: Negative Sinuses/Orbits: Mucosal edema paranasal sinuses. Bilateral cataract surgery Other: None IMPRESSION: Acute infarct left posterior cerebral artery territory has extended into the left occipital lobe since the prior MRI of 02/23/2018. Small subcentimeter area of petechial hemorrhage has developed in the left occipital infarct These results will be called to the ordering clinician or representative by the Radiologist Assistant, and communication documented in the PACS or zVision Dashboard. Electronically Signed   By: Franchot Gallo M.D.   On: 03/28/2018 13:08   Ct Angio Neck W Or Wo Contrast  Result Date: 04/16/2018 CLINICAL DATA:  Initial evaluation for increased confusion and right-sided weakness, recent stroke. EXAM: CT ANGIOGRAPHY NECK TECHNIQUE: Multidetector CT imaging of the neck was performed using the standard protocol during bolus administration of intravenous contrast. Multiplanar CT image reconstructions and MIPs were obtained to evaluate the vascular anatomy. Carotid  stenosis  measurements (when applicable) are obtained utilizing NASCET criteria, using the distal internal carotid diameter as the denominator. CONTRAST:  22mL OMNIPAQUE IOHEXOL 350 MG/ML SOLN COMPARISON:  Prior ultrasound from 02/23/2018 FINDINGS: Aortic arch: Visualized ascending aorta dilated up to 4.4 cm in diameter. Sequelae of prior CABG partially visualized. Arch itself of relative normal caliber with normal branch pattern. Moderate atheromatous plaque within the visualized aorta and about the origin of the great vessels without hemodynamically significant stenosis. Short-segment stenosis of approximately 80% present at the proximal left subclavian artery just proximal to the takeoff of the left vertebral artery (series 8, image 152). Visualized subclavian arteries otherwise patent without significant stenosis. Right carotid system: Right common carotid artery tortuous proximally. Scattered eccentric calcified plaque throughout the right common carotid artery with stenosis of up to 70% by NASCET criteria (series 7, image 149). Complex irregular plaque about the proximal right ICA with resultant severe stenosis of up to 75% by NASCET criteria (series 7, image 195) and measures approximately 2 cm in length. Right ICA widely patent distally to the skull base without stenosis. Prominent vascular calcifications noted within the carotid siphons. At the bifurcation Left carotid system: Left common carotid artery tortuous proximally. Scattered calcified plaque throughout the left common carotid artery without significant stenosis. Calcified plaque at the left bifurcation with associated stenosis of up to approximately 70% by NASCET criteria (series 7, image 181). Left ICA widely patent distally to the circle-of-Willis without stenosis or occlusion. Atheromatous plaque noted within the left carotid siphon. Vertebral arteries: Both of the vertebral arteries arise from the subclavian arteries. Focal plaque at the origin of the  vertebral arteries bilaterally with relatively mild stenosis. Vertebral arteries otherwise widely patent within the neck without stenosis or occlusion. Mild plaque within the V4 segments bilaterally without significant stenosis. Vertebral arteries are largely codominant. Skeleton: No acute osseus abnormality. No worrisome lytic or blastic osseous lesions. Moderate cervical spondylolysis noted at C6-7. Median sternotomy wires noted. Other neck: No acute soft tissue abnormality within the neck. No adenopathy. Salivary glands normal. Thyroid normal. Mild chronic maxillary sinusitis. Upper chest: Visualized upper chest and mediastinum demonstrates no acute abnormality. Coronary artery calcifications partially visualized. Scattered atelectatic changes noted within the visualized lungs. IMPRESSION: 1. Extensive atheromatous plaque about the carotid bifurcations/proximal ICAs bilaterally with associated stenosis of up to 75% on the right and 70% on the left. 2. Atheromatous narrowing of up to 70% within the right common carotid artery. 3. Widely patent vertebral arteries within the neck. 4. Short-segment 80% atheromatous stenosis involving the proximal left subclavian artery. 5. Dilatation of the ascending aorta up to 4.4 cm in diameter. Recommend annual imaging followup by CTA or MRA. This recommendation follows 2010 ACCF/AHA/AATS/ACR/ASA/SCA/SCAI/SIR/STS/SVM Guidelines for the Diagnosis and Management of Patients with Thoracic Aortic Disease. Circulation. 2010; 121: [id number] Electronically Signed   By: Jeannine Boga M.D.   On: 04/16/2018 02:32   Mr Brain Wo Contrast  Result Date: 04/16/2018 CLINICAL DATA:  Initial evaluation for increased confusion, worsened right arm weakness. Recent stroke. EXAM: MRI HEAD WITHOUT CONTRAST MRA HEAD WITHOUT CONTRAST TECHNIQUE: Multiplanar, multiecho pulse sequences of the brain and surrounding structures were obtained without intravenous contrast. Angiographic images of the  head were obtained using MRA technique without contrast. COMPARISON:  Prior CT from earlier the same day as well as previous MRI from 02/23/2018. FINDINGS: MRI HEAD FINDINGS Brain: Generalized age-related cerebral atrophy with chronic microvascular ischemic disease again noted, stable. There has been interval evolution of a large left PCA  territory infarct, late subacute to chronic in appearance. Overall, size of left PCA territory infarct is increased relative to previous MRI, but is chronic in appearance. Developing encephalomalacia with laminar necrosis seen within the area of infarction. No evidence for hemorrhagic transformation. No significant mass effect. 8 mm acute ischemic cortical infarct seen at the posterior right temporal lobe (series 100, image 28). No associated hemorrhage or mass effect. No other evidence for acute or interval infarct. No evidence for acute or chronic intracranial hemorrhage. No mass lesion, midline shift or mass effect. No hydrocephalus. No extra-axial fluid collection. Pituitary gland within normal limits. Vascular: Major intracranial vascular flow voids are maintained. Skull and upper cervical spine: Craniocervical junction normal. Bone marrow signal intensity within normal limits. No scalp soft tissue abnormality. Sinuses/Orbits: Globes and orbital soft tissues within normal limits. Patient status post ocular lens replacement bilaterally. Small maxillary sinus retention cysts noted paranasal sinuses are otherwise clear. No mastoid effusion. Inner ear structures normal. Other: None. MRA HEAD FINDINGS ANTERIOR CIRCULATION: Distal cervical segments of the internal carotid arteries are patent with antegrade flow. Petrous segments patent bilaterally. Scattered atheromatous irregularity within the cavernous/supraclinoid ICAs without hemodynamically significant stenosis. A1 segments patent bilaterally. Left A1 hypoplastic, accounting for the diminutive left ICA is compared to the right.  Normal anterior communicating artery. Anterior cerebral arteries patent to their distal aspects without stenosis M1 segments patent without stenosis. Normal MCA bifurcations. Distal MCA branches well perfused and symmetric POSTERIOR CIRCULATION: Vertebral arteries patent to the vertebrobasilar junction without stenosis. Patent right PICA. Left PICA not visualized. Dominant left AICA. Basilar artery widely patent to its distal aspect. basilar patent to its distal aspect without stenosis. Superior cerebral arteries patent bilaterally. Both of the posterior cerebral artery supplied via the basilar. Chronic occlusion of the proximal left PCA with minimal thready flow distally. Right PCA widely patent to its distal aspect without proximal high-grade or correctable stenosis. IMPRESSION: MRI HEAD IMPRESSION: 1. 8 mm acute ischemic nonhemorrhagic right temporal cortical infarct. 2. Interval evolution of large left PCA territory infarct, now late subacute to chronic in appearance. 3. Stable age-related cerebral atrophy with mild chronic small vessel ischemic disease. MRA HEAD IMPRESSION: 1. Chronic proximal left P2 occlusion. 2. Otherwise negative intracranial MRA with relatively minor atherosclerotic change for age. No other proximal high-grade or correctable stenosis identified. Electronically Signed   By: Jeannine Boga M.D.   On: 04/16/2018 01:12   Mr Brain Wo Contrast  Result Date: 03/28/2018 CLINICAL DATA:  Confusion, altered mental status. History of migraines, hypertension, hypercholesterolemia, diabetes, stroke. EXAM: MRI HEAD WITHOUT CONTRAST TECHNIQUE: Multiplanar, multiecho pulse sequences of the brain and surrounding structures were obtained without intravenous contrast. COMPARISON:  CT HEAD March 28, 2018 and MRI of the head Feb 23, 2018 FINDINGS: Moderately motion degraded examination. INTRACRANIAL CONTENTS: Reduced diffusion LEFT mesial temporal occipital lobes, limited assessment on ADC maps due to  motion without convincing low ADC values. New patchy susceptibility artifact LEFT occipital lobe. A few scattered chronic microhemorrhages. No parenchymal brain volume loss for age. No hydrocephalus. Patchy supratentorial white matter FLAIR T2 hyperintensities compatible with mild chronic small vessel ischemic changes. Old LEFT basal ganglia and thalamus infarcts. Prominent basal ganglia T2 hyperintensities seen with chronic small vessel ischemic changes. No abnormal extra-axial fluid collections. VASCULAR: Normal major intracranial vascular flow voids present at skull base. SKULL AND UPPER CERVICAL SPINE: No abnormal sellar expansion. No suspicious calvarial bone marrow signal. Craniocervical junction maintained. SINUSES/ORBITS: Mild paranasal sinus mucosal thickening without Air-fluid levels. Mastoid air  cells are well aerated. Status post bilateral ocular lens implants.The included ocular globes and orbital contents are non-suspicious. OTHER: None. IMPRESSION: 1. Motion degraded examination. 2. Further propagation LEFT PCA territory infarct is likely subacute with petechial hemorrhage. 3. Mild chronic small vessel ischemic changes. Old LEFT basal ganglia and thalamus small infarcts. Electronically Signed   By: Elon Alas M.D.   On: 03/28/2018 18:34   Mr Jodene Nam Head/brain OE Cm  Result Date: 04/16/2018 CLINICAL DATA:  Initial evaluation for increased confusion, worsened right arm weakness. Recent stroke. EXAM: MRI HEAD WITHOUT CONTRAST MRA HEAD WITHOUT CONTRAST TECHNIQUE: Multiplanar, multiecho pulse sequences of the brain and surrounding structures were obtained without intravenous contrast. Angiographic images of the head were obtained using MRA technique without contrast. COMPARISON:  Prior CT from earlier the same day as well as previous MRI from 02/23/2018. FINDINGS: MRI HEAD FINDINGS Brain: Generalized age-related cerebral atrophy with chronic microvascular ischemic disease again noted, stable. There  has been interval evolution of a large left PCA territory infarct, late subacute to chronic in appearance. Overall, size of left PCA territory infarct is increased relative to previous MRI, but is chronic in appearance. Developing encephalomalacia with laminar necrosis seen within the area of infarction. No evidence for hemorrhagic transformation. No significant mass effect. 8 mm acute ischemic cortical infarct seen at the posterior right temporal lobe (series 100, image 28). No associated hemorrhage or mass effect. No other evidence for acute or interval infarct. No evidence for acute or chronic intracranial hemorrhage. No mass lesion, midline shift or mass effect. No hydrocephalus. No extra-axial fluid collection. Pituitary gland within normal limits. Vascular: Major intracranial vascular flow voids are maintained. Skull and upper cervical spine: Craniocervical junction normal. Bone marrow signal intensity within normal limits. No scalp soft tissue abnormality. Sinuses/Orbits: Globes and orbital soft tissues within normal limits. Patient status post ocular lens replacement bilaterally. Small maxillary sinus retention cysts noted paranasal sinuses are otherwise clear. No mastoid effusion. Inner ear structures normal. Other: None. MRA HEAD FINDINGS ANTERIOR CIRCULATION: Distal cervical segments of the internal carotid arteries are patent with antegrade flow. Petrous segments patent bilaterally. Scattered atheromatous irregularity within the cavernous/supraclinoid ICAs without hemodynamically significant stenosis. A1 segments patent bilaterally. Left A1 hypoplastic, accounting for the diminutive left ICA is compared to the right. Normal anterior communicating artery. Anterior cerebral arteries patent to their distal aspects without stenosis M1 segments patent without stenosis. Normal MCA bifurcations. Distal MCA branches well perfused and symmetric POSTERIOR CIRCULATION: Vertebral arteries patent to the  vertebrobasilar junction without stenosis. Patent right PICA. Left PICA not visualized. Dominant left AICA. Basilar artery widely patent to its distal aspect. basilar patent to its distal aspect without stenosis. Superior cerebral arteries patent bilaterally. Both of the posterior cerebral artery supplied via the basilar. Chronic occlusion of the proximal left PCA with minimal thready flow distally. Right PCA widely patent to its distal aspect without proximal high-grade or correctable stenosis. IMPRESSION: MRI HEAD IMPRESSION: 1. 8 mm acute ischemic nonhemorrhagic right temporal cortical infarct. 2. Interval evolution of large left PCA territory infarct, now late subacute to chronic in appearance. 3. Stable age-related cerebral atrophy with mild chronic small vessel ischemic disease. MRA HEAD IMPRESSION: 1. Chronic proximal left P2 occlusion. 2. Otherwise negative intracranial MRA with relatively minor atherosclerotic change for age. No other proximal high-grade or correctable stenosis identified. Electronically Signed   By: Jeannine Boga M.D.   On: 04/16/2018 01:12      Assessment/Plan 1.  Bilateral carotid stenosis: Given the acute right  infarct while on maximal medical therapy secondary to his recent left hemispheric CVA in association with a 75 to 80% right internal carotid artery stenosis with the tandem 70% common carotid lesion I do recommend that he undergo surgery/stenting of the right side.  Based on the unique characteristics that the tandem lesions represent I believe angiography is warranted to more appropriately plan whether surgery or stenting would be optimal.  The internal carotid artery lesion is quite high and appears to be at the level of C2.  The length of treating both lesions is less desirable for stenting however I am not sure that you could completely expose the common carotid lesion surgically again representing an exposure challenge.  I believe conventional angiography will  help decide these factors.  I have discussed this with the patient and his family and he seems amenable.  The risks and benefits were reviewed.  I will plan for carotid angiography on Friday and then definitive treatment in approximately 2 weeks.  I will also discussed the case with Dr. Saralyn Pilar who is his cardiologist.  2.  Acute CVA in association with recent left hemispheric CVA: Neurology's input is greatly appreciated we will continue maximal medical therapy as well as physical therapy and rehab.  3.  Atrial fibrillation: Continue Eliquis for now.  4.  Coronary artery disease:Continue cardiac and antihypertensive medications as already ordered and reviewed, no changes at this time.Continue statin as ordered and reviewed, no changes at this time.  Nitrates PRN for chest pain   Hortencia Pilar, MD  04/16/2018 9:11 PM    This note was created with Dragon medical transcription system.  Any error is purely unintentional

## 2018-04-16 NOTE — Evaluation (Addendum)
Speech Language Pathology Evaluation Patient Details Name: Kyle Richardson MRN: [id number] DOB: [birth year] Today's Date: 04/16/2018 Time: 1761-6073 SLP Time Calculation (min) (ACUTE ONLY): 60 min  Problem List:  Patient Active Problem List   Diagnosis Date Noted  . TIA (transient ischemic attack) 04/15/2018  . Episode of confusion 04/08/2018  . Labile blood pressure   . Acute ischemic left PCA stroke ([place]) 02/27/2018  . Diabetes mellitus type 2 in nonobese ([facility])   . Glaucoma   . MG, ocular (myasthenia gravis) ([place])   . CVA (cerebral vascular accident) ([place]) 02/24/2018  . Right homonymous hemianopsia 02/23/2018  . Unresponsive episode 02/22/2018  . UTI (urinary tract infection) 01/28/2018  . Abnormal chest CT 09/20/2017  . Double vision 01/21/2017  . Ascending aortic aneurysm ([place]) 12/22/2016  . Blood-tinged sputum 10/23/2016  . Cough 10/23/2016  . PAF (paroxysmal atrial fibrillation) ([place]) 09/14/2016  . Health care maintenance 12/26/2014  . Stress 07/05/2014  . Migraine 05/10/2013  . Type 2 diabetes mellitus with complication, without long-term current use of insulin ([place]) 08/20/2012  . Hypertension 08/20/2012  . Hypercholesterolemia 08/20/2012  . CAD (coronary artery disease) 08/20/2012   Past Medical History:  Past Medical History:  Diagnosis Date  . Abdominal fibromatosis   . 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  s/p PTCA of the mid RCA 1/96, also  s/p  CABG x 4 7/96  . Degenerative disc disease   . Diabetes mellitus ([place])   . Glaucoma   . Hypercholesterolemia   . Hypertension   . Migraine headache    history of  . Osteoarthritis   . Sleep apnea   . Stroke [facility])    Past Surgical History:  Past Surgical History:  Procedure Laterality Date  . APPENDECTOMY    . CHOLECYSTECTOMY     Removed  . CORONARY ARTERY BYPASS GRAFT  7/96   x4  . HEMORRHOID SURGERY    . HERNIA REPAIR    . UPP and tonsillectomy  1/86   HPI:  Pt is  an 84 year old male with past medical history significant for CAD, s/p PTCA, CABG, type 2 diabetes, hypertension, hyperlipidemia, paroxysmal atrial fibrillation, and sleep apnea who was recently discharged from [facility] on 5/22 following an ischemic left PCA stroke. On 04/15/18, patient presented to the ED with right sided weakness and confusion. Patient was discharged from inpatient rehab approximately 2-3 weeks ago and had much improvement in right upper and lower extremity strength. Over the past few days, however, patient developed increasing confusion and slurred speech. Due to history of a-fib, currently on Eliquis, daughter requested a cardiology consult. Today, patient denies any chest pain, palpitations, or lower extremity edema. Daughter admitted to patient having exertional shortness of breath, increased confusion, and slurred speech.   Assessment / Plan / Recommendation Clinical Impression  Pt appears to present w/ Motor Speech deficits w/ reduced articulation of speech as well as min motor planning deficits. Pt also presents w/ chronic Expressive and Receptive Aphasia; recent CVA in 02/2018. Per most recent MRI: 8 mm acute ischemic nonhemorrhagic right temporal cortical infarct; Interval evolution of large left PCA territory infarct, now late subacute to chronic in appearance; age-related cerebral atrophy with mild chronic small vessel ischemic disease. Pt's Dysarthria was c/b reduced articulatory effort in connected speech resulting in more mumbled/muttered speech; somewhat rushed. OM exam revealed no significant unilateral lingual/labial weakness. When pt utilized strategies of slowing down, increasing his volume, opening his mouth and exaggerating  his speech min more, his intelligibility increased significantly.  With regard to pt's Expressive and Receptive Aphasia, pt and SLP were also able to utilize strategies such as he learned during his previous Speech therapy Rehab at [facility] to aid word recall  (phonemic cues, semantic cues, closed-ended sentence completion). Noted Perseveration during task/conversation as well as phonemic and semantic paraphasias. Pt was not always aware of his errors. There was also Jargon speech during parts of conversation or responses to task questions. Pt repeated SLP during 1-2 step commands w/ accuracy re: the command but had difficulty then demonstrating the command. Pt is HOH which needs to be taken into account during conversation and w/ any instructions/information given by others(especially medical information as these are often unfamiliar words). Pt wears glasses - he also c/o min vision deficits during picture tasks(he had a field cut id'd during last CVA per Dtr); deficits in letter/number identification were apparent, and pt was unable to read at the word level.  Pt would benefit from continued skilled ST services to continue w/ Aphasia therapy as well as Motor Speech therapy in light of presentation(and evolution of previous CVA) of the MRI in order to aid pt's verbal communication skills in ADLs.  Education given to Daughter on strategies to aid word recall and encourage pt to slow down/increase volume during speech; as well as continue previous strategies learned during Rehab at [facility]. Dtr agreed. Pt is having ongoing intervention w/ Cardiology/Surgery for an angiogram secondary to the degree of stenosis and CVAs in order to develop a plan of intervention. ST services will be available for education w/ pt and family while admitted; skilled therapy will be best once pt is discharged to a more structured, quiet setting post any procedures and treatments.     SLP Assessment  SLP Recommendation/Assessment: Patient needs continued Speech Lanaguage Pathology Services SLP Visit Diagnosis: Dysarthria and anarthria (R47.1);Aphasia (R47.01)    Follow Up Recommendations  Home health SLP(TBD)    Frequency and Duration (TBD)  (TBD)      SLP Evaluation Cognition   Overall Cognitive Status: Impaired/Different from baseline(but deficits noted at CVA in 02/2018) Arousal/Alertness: Awake/alert Orientation Level: Oriented to person(only) Attention: Focused;Sustained Focused Attention: Impaired Focused Attention Impairment: Verbal basic;Functional basic Sustained Attention: Appears intact Sustained Attention Impairment: Verbal basic;Functional basic Memory: Impaired Memory Impairment: Decreased recall of new information;Retrieval deficit Decreased Long Term Memory: Verbal basic Decreased Short Term Memory: Verbal basic Awareness: Impaired Awareness Impairment: Intellectual impairment;Anticipatory impairment Problem Solving: Impaired Problem Solving Impairment: Verbal basic;Functional basic Executive Function: Reasoning;Sequencing;Organizing Reasoning: Impaired Reasoning Impairment: Verbal basic;Functional basic Sequencing: Impaired Sequencing Impairment: Verbal basic;Functional basic Organizing: Impaired Organizing Impairment: Verbal basic;Functional basic Behaviors: Perseveration;Restless Safety/Judgment: Impaired       Comprehension  Auditory Comprehension Overall Auditory Comprehension: Impaired Yes/No Questions: Impaired Basic Biographical Questions: 51-75% accurate Basic Immediate Environment Questions: 50-74% accurate Commands: Impaired One Step Basic Commands: 50-74% accurate Multistep Basic Commands: 25-49% accurate Conversation: Simple Other Conversation Comments: pt was able to elaborate however, speech was dysarthric w/ muttered/mumbled speech; perseverations. He responded well to phonemic and semantic cues; sentence completion cues Interfering Components: Visual impairments;Hearing(restlessness) EffectiveTechniques: Extra processing time;Increased volume;Repetition;Slowed speech;Stressing words;Visual/Gestural cues Visual Recognition/Discrimination Discrimination: Not tested Reading Comprehension Reading Status: Impaired Word  level: Impaired(impacted by vision deficits) Functional Environmental (signs, name badge): Impaired Interfering Components: Visual acuity;Visual scanning;Right neglect/inattention(some are baseline per Dtr) Effective Techniques: (none)    Expression Expression Primary Mode of Expression: Verbal Verbal Expression Overall Verbal Expression: Impaired Initiation: No impairment Automatic Speech: Name;Social  Response;Counting;Day of week;Month of year(all WFL) Level of Generative/Spontaneous Verbalization: Sentence Repetition: Impaired Level of Impairment: Phrase level Naming: Impairment Responsive: 76-100% accurate Confrontation: Impaired Convergent: 50-74% accurate Divergent: 25-49% accurate Other Naming Comments: perseveration Verbal Errors: Perseveration;Jargon;Not aware of errors;Semantic paraphasias;Phonemic paraphasias(not fully aware of errors) Pragmatics: No impairment(WFL) Effective Techniques: Semantic cues;Sentence completion;Phonemic cues Non-Verbal Means of Communication: Not applicable Other Verbal Expression Comments: often repeated immediately following SLP; HOH status impacted pt's receiving of information as well Written Expression Dominant Hand: Right(RUE weakness) Written Expression: Not tested   Oral / Motor  Oral Motor/Sensory Function Overall Oral Motor/Sensory Function: Within functional limits Motor Speech Overall Motor Speech: Impaired Respiration: Within functional limits Phonation: Normal Resonance: Within functional limits Articulation: Impaired Level of Impairment: Phrase Intelligibility: Intelligibility reduced Word: 75-100% accurate Phrase: 75-100% accurate Sentence: 50-74% accurate Conversation: 50-74% accurate Motor Planning: Impaired Motor Speech Errors: Inconsistent(articulation deficits w/ repetitive speech tasks) Interfering Components: Hearing loss(previous deficits from CVA in 02/2018) Effective Techniques: Slow rate;Increased vocal  intensity;Over-articulate;Pause   GO                     Orinda Kenner, MS, CCC-SLP Antaniya Venuti 04/16/2018, 5:49 PM

## 2018-04-16 NOTE — Consult Note (Signed)
Referring Physician: Vianne Bulls    Chief Complaint: Confusion  HPI: Kyle Richardson is an 84 y.o. 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 fib on Eliquis, presenting on yesterday for confusion and right-sided weakness.  The patient is unable to give much history due to his confusion and altered mental status.  The patient is companied by his wife and daughter, who state that for the past 5 days he has become increasingly confused.  Patient was discharged from rehab approximately 2 or 3 weeks ago and has had a progressively improving course with increasing right upper and lower extremities strength and normal mental status.  However, over the past 5 days the patient has become increasingly confused and is no longer oriented.  In addition, his expressive aphasia has become worse and he has started to have slurred speech.  Yesterday his wife noted that he was unable to grasp his pills with his right hand, and that his right lower extremity was "heavy."  Initial NIHSS of 9.    Date last known well: Unable to determine Time last known well: Unable to determine tPA Given: No: Unknown LKW, recent infarct  Past Medical History:  Diagnosis Date  . Abdominal fibromatosis   . 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  s/p PTCA of the mid RCA 1/96, also  s/p  CABG x 4 7/96  . Degenerative disc disease   . Diabetes mellitus ([place])   . Glaucoma   . Hypercholesterolemia   . Hypertension   . Migraine headache    history of  . Osteoarthritis   . Sleep apnea   . Stroke [facility])     Past Surgical History:  Procedure Laterality Date  . APPENDECTOMY    . CHOLECYSTECTOMY     Removed  . CORONARY ARTERY BYPASS GRAFT  7/96   x4  . HEMORRHOID SURGERY    . HERNIA REPAIR    . UPP and tonsillectomy  1/86    Family History  Problem Relation  Age of Onset  . Heart disease Mother        died age 47  . Heart disease Father        and other vascular disease  . Diabetes Unknown        four siblings  . Heart disease Brother        s/p CABG  . Colon cancer Neg Hx   . Prostate cancer Neg Hx    Social History:  reports that he has never smoked. He has never used smokeless tobacco. He reports that he does not drink alcohol or use drugs.  Allergies:  Allergies  Allergen Reactions  . Clarithromycin Other (See Comments) and Diarrhea  . Codeine   . Flomax [Tamsulosin Hcl]   . Lamisil [Terbinafine]   . Levofloxacin Other (See Comments)    Diplopia. NEVER put patient on levoquin!  . Sertraline Other (See Comments)  . Sulfa Antibiotics     Medications:  I have reviewed the patient's current medications. Prior to Admission:  Medications Prior to Admission  Medication Sig Dispense Refill Last Dose  . acetaminophen (TYLENOL) 500 MG tablet Take 1 tablet (500 mg total) by mouth every 6 (six) hours as needed. 30 tablet 0 PRN at PRN  . apixaban (ELIQUIS) 5 MG TABS tablet Take 1 tablet (5 mg total) by mouth 2 (two) times daily. 30 tablet  1 04/15/2018 at 0900  . atorvastatin (LIPITOR) 40 MG tablet Take 1 tablet (40 mg total) by mouth daily. 30 tablet 1 04/15/2018 at 0900  . divalproex (DEPAKOTE ER) 500 MG 24 hr tablet Take 1 tablet (500 mg total) by mouth daily. 30 tablet 1 04/14/2018 at 2000  . FLUoxetine (PROZAC) 10 MG capsule Take 1 capsule (10 mg total) by mouth daily. 30 capsule 3 04/15/2018 at 0900  . fluticasone (FLONASE) 50 MCG/ACT nasal spray SPRAY 2 SPRAYS IN EACH NOSTRIL ONCE A DAY 16 g 2 04/14/2018 at 2000  . latanoprost (XALATAN) 0.005 % ophthalmic solution Place 1 drop into both eyes at bedtime.   04/14/2018 at 2000  . loratadine (CLARITIN) 10 MG tablet Take 10 mg by mouth daily.   04/15/2018 at 0900  . metoprolol succinate (TOPROL-XL) 25 MG 24 hr tablet Take 1 tablet (25 mg total) by mouth daily. 30 tablet 1 04/15/2018 at 0900  .  nitroGLYCERIN (NITROSTAT) 0.4 MG SL tablet Place 0.4 mg under the tongue every 5 (five) minutes as needed. As directed   PRN at PRN  . amLODipine (NORVASC) 5 MG tablet Take 1 tablet (5 mg total) by mouth daily. (Patient not taking: Reported on 04/15/2018) 30 tablet 1 Not Taking at Unknown time   Scheduled: . amLODipine  5 mg Oral Daily  . apixaban  5 mg Oral BID  . atorvastatin  40 mg Oral Daily  . divalproex  500 mg Oral QHS  . FLUoxetine  10 mg Oral Daily  . fluticasone  1 spray Each Nare QHS  . insulin aspart  0-9 Units Subcutaneous TID WC  . latanoprost  1 drop Both Eyes QHS  . loratadine  10 mg Oral Daily  . metoprolol succinate  25 mg Oral Daily  . pyridostigmine  60 mg Oral BID    ROS: Patient unable to provide due to mental status  Physical Examination: Blood pressure (!) 146/75, pulse 60, temperature 97.7 F (36.5 C), temperature source Oral, resp. rate 18, height 5\' 8"  (1.727 m), weight 73.6 kg (162 lb 4.8 oz), SpO2 99 %.  HEENT-  Normocephalic, no lesions, without obvious abnormality.  Normal external eye and conjunctiva.  Normal TM's bilaterally.  Normal auditory canals and external ears. Normal external nose, mucus membranes and septum.  Normal pharynx. Cardiovascular- S1, S2 normal, pulses palpable throughout   Lungs- chest clear, no wheezing, rales, normal symmetric air entry Abdomen- soft, non-tender; bowel sounds normal; no masses,  no organomegaly Extremities- no edema Lymph-no adenopathy palpable Musculoskeletal-no joint tenderness, deformity or swelling Skin-warm and dry, no hyperpigmentation, vitiligo, or suspicious lesions  Neurological Examination   Mental Status: Alert.  When asked questions starts to talk about children being left by a school bus.  Not fluent.  Unable to follow commands.   Cranial Nerves: II: Discs flat bilaterally; RHH, pupils equal, round, reactive to light and accommodation III,IV, VI: ptosis not present, extra-ocular motions intact  bilaterally V,VII: right facial droop VIII: hearing reduced bilaterally IX,X: gag reflex present XI: bilateral shoulder shrug XII: midline tongue extension Motor: Neglects the right but appears to give near full strength of the right upper and lower extremities when prompted.  5/5 on the left.   Sensory: Appreciates light touch throughout Deep Tendon Reflexes: 2+ and symmetric with absent AJ's bilaterally Plantars: Right: upgoing   Left: downgoing Cerebellar: Unable to test due to ability to follow commands.   Gait: not tested due to safety concerns    Laboratory Studies:  Basic  Metabolic Panel: Recent Labs  Lab 04/15/18 1550  NA 140  K 4.7  CL 103  CO2 30  GLUCOSE 167*  BUN 13  CREATININE 0.93  CALCIUM 9.2    Liver Function Tests: Recent Labs  Lab 04/15/18 1550  AST 26  ALT 30  ALKPHOS 72  BILITOT 0.4  PROT 6.8  ALBUMIN 3.7   No results for input(s): LIPASE, AMYLASE in the last 168 hours. No results for input(s): AMMONIA in the last 168 hours.  CBC: Recent Labs  Lab 04/15/18 1550  WBC 7.7  HGB 15.7  HCT 45.3  MCV 94.5  PLT 192    Cardiac Enzymes: Recent Labs  Lab 04/15/18 1550  TROPONINI <0.03    BNP: Invalid input(s): POCBNP  CBG: Recent Labs  Lab 04/16/18 0749 04/16/18 1152  GLUCAP 122* 163*    Microbiology: Results for orders placed or performed during the hospital encounter of 02/27/18  Culture, Urine     Status: Abnormal   Collection Time: 03/27/18 11:37 PM  Result Value Ref Range Status   Specimen Description URINE, CLEAN CATCH  Final   Special Requests   Final    NONE Performed at [facility], [place] [address], [place], [place] [address]    Culture MULTIPLE SPECIES PRESENT, SUGGEST RECOLLECTION (A)  Final   Report Status 03/29/2018 FINAL  Final    Coagulation Studies: Recent Labs    04/15/18 1550  LABPROT 13.1  INR 1.00    Urinalysis:  Recent Labs  Lab 04/15/18 1534  COLORURINE YELLOW*  LABSPEC 1.019   PHURINE 5.0  GLUCOSEU 150*  HGBUR NEGATIVE  BILIRUBINUR NEGATIVE  KETONESUR NEGATIVE  PROTEINUR NEGATIVE  NITRITE NEGATIVE  LEUKOCYTESUR NEGATIVE    Lipid Panel:    Component Value Date/Time   CHOL 159 04/16/2018 0558   CHOL 129 08/13/2013 1026   TRIG 288 (H) 04/16/2018 0558   TRIG 166 08/13/2013 1026   HDL 35 (L) 04/16/2018 0558   HDL 47 08/13/2013 1026   CHOLHDL 4.5 04/16/2018 0558   VLDL 58 (H) 04/16/2018 0558   VLDL 33 08/13/2013 1026   LDLCALC 66 04/16/2018 0558   LDLCALC 49 08/13/2013 1026    HgbA1C:  Lab Results  Component Value Date   HGBA1C 6.8 (H) 04/16/2018    Urine Drug Screen:  No results found for: LABOPIA, COCAINSCRNUR, LABBENZ, AMPHETMU, THCU, LABBARB  Alcohol Level: No results for input(s): ETH in the last 168 hours.  Other results: EKG: sinus rhythm at 69 bpm with occasional PVC's.  Imaging: Dg Chest 2 View  Result Date: 04/15/2018 CLINICAL DATA:  Right-sided weakness. EXAM: CHEST - 2 VIEW COMPARISON:  Radiographs of March 29, 2018. FINDINGS: Stable cardiomediastinal silhouette. Status post coronary artery bypass graft. Atherosclerosis of thoracic aorta is noted. No pneumothorax or pleural effusion is noted. No acute pulmonary disease is noted. Bony thorax is unremarkable. IMPRESSION: No active cardiopulmonary disease. Aortic Atherosclerosis (ICD10-I70.0). Electronically Signed   By: Marijo Conception, M.D.   On: 04/15/2018 16:45   Ct Head Wo Contrast  Result Date: 04/15/2018 CLINICAL DATA:  Progressed confusion and weakness.  Recent stroke. EXAM: CT HEAD WITHOUT CONTRAST TECHNIQUE: Contiguous axial images were obtained from the base of the skull through the vertex without intravenous contrast. COMPARISON:  MRI of the head March 28, 2018 FINDINGS: BRAIN: No intraparenchymal hemorrhage, mass effect nor midline shift. LEFT mesial occipital lobe encephalomalacia with mild ex vacuo dilatation LEFT atrium. Small area contiguous LEFT thalamus encephalomalacia. Old  LEFT basal  ganglia and LEFT thalamus lacunar infarcts. No global parenchymal brain volume loss for age. Patchy supratentorial white matter hypodensities less than expected for patient's age, though non-specific are most compatible with chronic small vessel ischemic disease. No acute large vascular territory infarcts. No abnormal extra-axial fluid collections. Basal cisterns are patent. VASCULAR: Severe calcific atherosclerosis of the carotid siphons. SKULL: No skull fracture. No significant scalp soft tissue swelling. SINUSES/ORBITS: Mild paranasal sinus mucosal thickening. Included mastoid air cells are well aerated.The included ocular globes and orbital contents are non-suspicious. Status post ocular lens implants. OTHER: None. IMPRESSION: 1. No acute intracranial process. 2. Subacute to chronic large LEFT PCA territory infarct. 3. Old LEFT basal ganglia and thalamus lacunar infarcts. Mild chronic small vessel ischemic changes. Electronically Signed   By: Elon Alas M.D.   On: 04/15/2018 16:31   Ct Angio Neck W Or Wo Contrast  Result Date: 04/16/2018 CLINICAL DATA:  Initial evaluation for increased confusion and right-sided weakness, recent stroke. EXAM: CT ANGIOGRAPHY NECK TECHNIQUE: Multidetector CT imaging of the neck was performed using the standard protocol during bolus administration of intravenous contrast. Multiplanar CT image reconstructions and MIPs were obtained to evaluate the vascular anatomy. Carotid stenosis measurements (when applicable) are obtained utilizing NASCET criteria, using the distal internal carotid diameter as the denominator. CONTRAST:  108mL OMNIPAQUE IOHEXOL 350 MG/ML SOLN COMPARISON:  Prior ultrasound from 02/23/2018 FINDINGS: Aortic arch: Visualized ascending aorta dilated up to 4.4 cm in diameter. Sequelae of prior CABG partially visualized. Arch itself of relative normal caliber with normal branch pattern. Moderate atheromatous plaque within the visualized aorta and  about the origin of the great vessels without hemodynamically significant stenosis. Short-segment stenosis of approximately 80% present at the proximal left subclavian artery just proximal to the takeoff of the left vertebral artery (series 8, image 152). Visualized subclavian arteries otherwise patent without significant stenosis. Right carotid system: Right common carotid artery tortuous proximally. Scattered eccentric calcified plaque throughout the right common carotid artery with stenosis of up to 70% by NASCET criteria (series 7, image 149). Complex irregular plaque about the proximal right ICA with resultant severe stenosis of up to 75% by NASCET criteria (series 7, image 195) and measures approximately 2 cm in length. Right ICA widely patent distally to the skull base without stenosis. Prominent vascular calcifications noted within the carotid siphons. At the bifurcation Left carotid system: Left common carotid artery tortuous proximally. Scattered calcified plaque throughout the left common carotid artery without significant stenosis. Calcified plaque at the left bifurcation with associated stenosis of up to approximately 70% by NASCET criteria (series 7, image 181). Left ICA widely patent distally to the circle-of-Willis without stenosis or occlusion. Atheromatous plaque noted within the left carotid siphon. Vertebral arteries: Both of the vertebral arteries arise from the subclavian arteries. Focal plaque at the origin of the vertebral arteries bilaterally with relatively mild stenosis. Vertebral arteries otherwise widely patent within the neck without stenosis or occlusion. Mild plaque within the V4 segments bilaterally without significant stenosis. Vertebral arteries are largely codominant. Skeleton: No acute osseus abnormality. No worrisome lytic or blastic osseous lesions. Moderate cervical spondylolysis noted at C6-7. Median sternotomy wires noted. Other neck: No acute soft tissue abnormality within  the neck. No adenopathy. Salivary glands normal. Thyroid normal. Mild chronic maxillary sinusitis. Upper chest: Visualized upper chest and mediastinum demonstrates no acute abnormality. Coronary artery calcifications partially visualized. Scattered atelectatic changes noted within the visualized lungs. IMPRESSION: 1. Extensive atheromatous plaque about the carotid bifurcations/proximal ICAs bilaterally with associated stenosis  of up to 75% on the right and 70% on the left. 2. Atheromatous narrowing of up to 70% within the right common carotid artery. 3. Widely patent vertebral arteries within the neck. 4. Short-segment 80% atheromatous stenosis involving the proximal left subclavian artery. 5. Dilatation of the ascending aorta up to 4.4 cm in diameter. Recommend annual imaging followup by CTA or MRA. This recommendation follows 2010 ACCF/AHA/AATS/ACR/ASA/SCA/SCAI/SIR/STS/SVM Guidelines for the Diagnosis and Management of Patients with Thoracic Aortic Disease. Circulation. 2010; 121: D357-S177 Electronically Signed   By: Jeannine Boga M.D.   On: 04/16/2018 02:32   Mr Brain Wo Contrast  Result Date: 04/16/2018 CLINICAL DATA:  Initial evaluation for increased confusion, worsened right arm weakness. Recent stroke. EXAM: MRI HEAD WITHOUT CONTRAST MRA HEAD WITHOUT CONTRAST TECHNIQUE: Multiplanar, multiecho pulse sequences of the brain and surrounding structures were obtained without intravenous contrast. Angiographic images of the head were obtained using MRA technique without contrast. COMPARISON:  Prior CT from earlier the same day as well as previous MRI from 02/23/2018. FINDINGS: MRI HEAD FINDINGS Brain: Generalized age-related cerebral atrophy with chronic microvascular ischemic disease again noted, stable. There has been interval evolution of a large left PCA territory infarct, late subacute to chronic in appearance. Overall, size of left PCA territory infarct is increased relative to previous MRI, but is  chronic in appearance. Developing encephalomalacia with laminar necrosis seen within the area of infarction. No evidence for hemorrhagic transformation. No significant mass effect. 8 mm acute ischemic cortical infarct seen at the posterior right temporal lobe (series 100, image 28). No associated hemorrhage or mass effect. No other evidence for acute or interval infarct. No evidence for acute or chronic intracranial hemorrhage. No mass lesion, midline shift or mass effect. No hydrocephalus. No extra-axial fluid collection. Pituitary gland within normal limits. Vascular: Major intracranial vascular flow voids are maintained. Skull and upper cervical spine: Craniocervical junction normal. Bone marrow signal intensity within normal limits. No scalp soft tissue abnormality. Sinuses/Orbits: Globes and orbital soft tissues within normal limits. Patient status post ocular lens replacement bilaterally. Small maxillary sinus retention cysts noted paranasal sinuses are otherwise clear. No mastoid effusion. Inner ear structures normal. Other: None. MRA HEAD FINDINGS ANTERIOR CIRCULATION: Distal cervical segments of the internal carotid arteries are patent with antegrade flow. Petrous segments patent bilaterally. Scattered atheromatous irregularity within the cavernous/supraclinoid ICAs without hemodynamically significant stenosis. A1 segments patent bilaterally. Left A1 hypoplastic, accounting for the diminutive left ICA is compared to the right. Normal anterior communicating artery. Anterior cerebral arteries patent to their distal aspects without stenosis M1 segments patent without stenosis. Normal MCA bifurcations. Distal MCA branches well perfused and symmetric POSTERIOR CIRCULATION: Vertebral arteries patent to the vertebrobasilar junction without stenosis. Patent right PICA. Left PICA not visualized. Dominant left AICA. Basilar artery widely patent to its distal aspect. basilar patent to its distal aspect without  stenosis. Superior cerebral arteries patent bilaterally. Both of the posterior cerebral artery supplied via the basilar. Chronic occlusion of the proximal left PCA with minimal thready flow distally. Right PCA widely patent to its distal aspect without proximal high-grade or correctable stenosis. IMPRESSION: MRI HEAD IMPRESSION: 1. 8 mm acute ischemic nonhemorrhagic right temporal cortical infarct. 2. Interval evolution of large left PCA territory infarct, now late subacute to chronic in appearance. 3. Stable age-related cerebral atrophy with mild chronic small vessel ischemic disease. MRA HEAD IMPRESSION: 1. Chronic proximal left P2 occlusion. 2. Otherwise negative intracranial MRA with relatively minor atherosclerotic change for age. No other proximal high-grade or correctable  stenosis identified. Electronically Signed   By: Jeannine Boga M.D.   On: 04/16/2018 01:12   Mr Jodene Nam Head/brain KW Cm  Result Date: 04/16/2018 CLINICAL DATA:  Initial evaluation for increased confusion, worsened right arm weakness. Recent stroke. EXAM: MRI HEAD WITHOUT CONTRAST MRA HEAD WITHOUT CONTRAST TECHNIQUE: Multiplanar, multiecho pulse sequences of the brain and surrounding structures were obtained without intravenous contrast. Angiographic images of the head were obtained using MRA technique without contrast. COMPARISON:  Prior CT from earlier the same day as well as previous MRI from 02/23/2018. FINDINGS: MRI HEAD FINDINGS Brain: Generalized age-related cerebral atrophy with chronic microvascular ischemic disease again noted, stable. There has been interval evolution of a large left PCA territory infarct, late subacute to chronic in appearance. Overall, size of left PCA territory infarct is increased relative to previous MRI, but is chronic in appearance. Developing encephalomalacia with laminar necrosis seen within the area of infarction. No evidence for hemorrhagic transformation. No significant mass effect. 8 mm acute  ischemic cortical infarct seen at the posterior right temporal lobe (series 100, image 28). No associated hemorrhage or mass effect. No other evidence for acute or interval infarct. No evidence for acute or chronic intracranial hemorrhage. No mass lesion, midline shift or mass effect. No hydrocephalus. No extra-axial fluid collection. Pituitary gland within normal limits. Vascular: Major intracranial vascular flow voids are maintained. Skull and upper cervical spine: Craniocervical junction normal. Bone marrow signal intensity within normal limits. No scalp soft tissue abnormality. Sinuses/Orbits: Globes and orbital soft tissues within normal limits. Patient status post ocular lens replacement bilaterally. Small maxillary sinus retention cysts noted paranasal sinuses are otherwise clear. No mastoid effusion. Inner ear structures normal. Other: None. MRA HEAD FINDINGS ANTERIOR CIRCULATION: Distal cervical segments of the internal carotid arteries are patent with antegrade flow. Petrous segments patent bilaterally. Scattered atheromatous irregularity within the cavernous/supraclinoid ICAs without hemodynamically significant stenosis. A1 segments patent bilaterally. Left A1 hypoplastic, accounting for the diminutive left ICA is compared to the right. Normal anterior communicating artery. Anterior cerebral arteries patent to their distal aspects without stenosis M1 segments patent without stenosis. Normal MCA bifurcations. Distal MCA branches well perfused and symmetric POSTERIOR CIRCULATION: Vertebral arteries patent to the vertebrobasilar junction without stenosis. Patent right PICA. Left PICA not visualized. Dominant left AICA. Basilar artery widely patent to its distal aspect. basilar patent to its distal aspect without stenosis. Superior cerebral arteries patent bilaterally. Both of the posterior cerebral artery supplied via the basilar. Chronic occlusion of the proximal left PCA with minimal thready flow distally.  Right PCA widely patent to its distal aspect without proximal high-grade or correctable stenosis. IMPRESSION: MRI HEAD IMPRESSION: 1. 8 mm acute ischemic nonhemorrhagic right temporal cortical infarct. 2. Interval evolution of large left PCA territory infarct, now late subacute to chronic in appearance. 3. Stable age-related cerebral atrophy with mild chronic small vessel ischemic disease. MRA HEAD IMPRESSION: 1. Chronic proximal left P2 occlusion. 2. Otherwise negative intracranial MRA with relatively minor atherosclerotic change for age. No other proximal high-grade or correctable stenosis identified. Electronically Signed   By: Jeannine Boga M.D.   On: 04/16/2018 01:12    Assessment: 84 y.o. male with recent PCA infarct presenting with worsening neurological status.  Patient with a history of atrial fibrillation on Eliquis.  MRI of the brain reviewed and shows an acute right temporal infarct.  Etiology likely embolic although patient noted on CTA to have 75% right and 70% left ICA stenosis.  A1c 6.8, LDL 66 on statin.  Stroke  Risk Factors - atrial fibrillation, diabetes mellitus, hyperlipidemia and hypertension  Plan: 1. PT consult, OT consult, Speech consult 2. Prophylactic therapy-Continue Eliquis 3. NPO until RN stroke swallow screen 4. Telemetry monitoring 5. Frequent neuro checks 6. Vascular to evaluate.     Alexis Goodell, MD Neurology [phone] 04/16/2018, 1:52 PM

## 2018-04-16 NOTE — Consult Note (Signed)
[facility] Cardiology  CARDIOLOGY CONSULT NOTE  Patient ID: Kyle Richardson MRN: [id number] DOB/AGE: [birth year] 84 y.o.  Admit date: 04/15/2018 Referring Physician Dr. Alysia Penna  Primary Physician Dr. Einar Pheasant Primary Cardiologist Dr. Isaias Cowman  Reason for Consultation History of paroxysmal atrial fibrillation   HPI: Kyle Richardson is an 84 year old male with past medical history significant for CAD, s/p PTCA, CABG, type 2 diabetes, hypertension, hyperlipidemia, paroxysmal atrial fibrillation, and sleep apnea who was recently discharged from [facility] on 5/22 following an ischemic left PCA stroke. On 04/15/18, patient presented to the ED with right sided weakness and confusion. Patient was discharged from inpatient rehab approximately 2-3 weeks ago and had much improvement in right upper and lower extremity strength. Over the past few days, however, patient developed increasing confusion and slurred speech. Due to history of a-fib, currently on Eliquis, daughter requested a cardiology consult. Today, patient denies any chest pain, palpitations, or lower extremity edema. Daughter admitted to patient having exertional shortness of breath, increased confusion, and slurred speech. Patient also denied abdominal pain, N/V/D.   Mr. Morandi is followed by Dr. Saralyn Pilar at [facility]. Last echo done in 5/19 showed normal EF of 60-65% with mild LVH.   Due to patient's current mental state, all information was obtained via the patient's daughter.   Review of systems complete and found to be negative unless listed above     Past Medical History:  Diagnosis Date  . Abdominal fibromatosis   . 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  s/p PTCA of the mid RCA 1/96, also  s/p  CABG x 4 7/96  . Degenerative disc disease   . Diabetes mellitus ([place])   . Glaucoma   . Hypercholesterolemia   . Hypertension   . Migraine headache    history of  . Osteoarthritis   .  Sleep apnea   . Stroke [facility])     Past Surgical History:  Procedure Laterality Date  . APPENDECTOMY    . CHOLECYSTECTOMY     Removed  . CORONARY ARTERY BYPASS GRAFT  7/96   x4  . HEMORRHOID SURGERY    . HERNIA REPAIR    . UPP and tonsillectomy  1/86    Medications Prior to Admission  Medication Sig Dispense Refill Last Dose  . acetaminophen (TYLENOL) 500 MG tablet Take 1 tablet (500 mg total) by mouth every 6 (six) hours as needed. 30 tablet 0 PRN at PRN  . apixaban (ELIQUIS) 5 MG TABS tablet Take 1 tablet (5 mg total) by mouth 2 (two) times daily. 30 tablet 1 04/15/2018 at 0900  . atorvastatin (LIPITOR) 40 MG tablet Take 1 tablet (40 mg total) by mouth daily. 30 tablet 1 04/15/2018 at 0900  . divalproex (DEPAKOTE ER) 500 MG 24 hr tablet Take 1 tablet (500 mg total) by mouth daily. 30 tablet 1 04/14/2018 at 2000  . FLUoxetine (PROZAC) 10 MG capsule Take 1 capsule (10 mg total) by mouth daily. 30 capsule 3 04/15/2018 at 0900  . fluticasone (FLONASE) 50 MCG/ACT nasal spray SPRAY 2 SPRAYS IN EACH NOSTRIL ONCE A DAY 16 g 2 04/14/2018 at 2000  . latanoprost (XALATAN) 0.005 % ophthalmic solution Place 1 drop into both eyes at bedtime.   04/14/2018 at 2000  . loratadine (CLARITIN) 10 MG tablet Take 10 mg by mouth daily.   04/15/2018 at 0900  . metoprolol succinate (TOPROL-XL) 25 MG 24 hr tablet Take 1 tablet (25 mg total)  by mouth daily. 30 tablet 1 04/15/2018 at 0900  . nitroGLYCERIN (NITROSTAT) 0.4 MG SL tablet Place 0.4 mg under the tongue every 5 (five) minutes as needed. As directed   PRN at PRN  . amLODipine (NORVASC) 5 MG tablet Take 1 tablet (5 mg total) by mouth daily. (Patient not taking: Reported on 04/15/2018) 30 tablet 1 Not Taking at Unknown time   Social History   Socioeconomic History  . Marital status: Married    Spouse name: [name]  . Number of children: 2  . Years of education: Not on file  . Highest education level: Not on file  Occupational History  . Not on file  Social Needs  .  Financial resource strain: Not hard at all  . Food insecurity:    Worry: Never true    Inability: Never true  . Transportation needs:    Medical: No    Non-medical: No  Tobacco Use  . Smoking status: Never Smoker  . Smokeless tobacco: Never Used  Substance and Sexual Activity  . Alcohol use: No    Alcohol/week: 0.0 oz  . Drug use: No  . Sexual activity: Not Currently  Lifestyle  . Physical activity:    Days per week: Patient refused    Minutes per session: Patient refused  . Stress: Not at all  Relationships  . Social connections:    Talks on phone: Patient refused    Gets together: Patient refused    Attends religious service: Patient refused    Active member of club or organization: Patient refused    Attends meetings of clubs or organizations: Patient refused    Relationship status: Patient refused  . Intimate partner violence:    Fear of current or ex partner: Patient refused    Emotionally abused: Patient refused    Physically abused: Patient refused    Forced sexual activity: Patient refused  Other Topics Concern  . Not on file  Social History Narrative  . Not on file    Family History  Problem Relation Age of Onset  . Heart disease Mother        died age 72  . Heart disease Father        and other vascular disease  . Diabetes Unknown        four siblings  . Heart disease Brother        s/p CABG  . Colon cancer Neg Hx   . Prostate cancer Neg Hx       Review of systems complete and found to be negative unless listed above      PHYSICAL EXAM  General: Well developed, well nourished, in no acute distress HEENT:  Normocephalic and atramatic Neck:  No JVD.  Lungs: Clear bilaterally to auscultation and percussion. Heart: HRRR . Normal S1 and S2 without gallops or murmurs.  Abdomen: Bowel sounds are positive, abdomen soft and non-tender  Msk:  Back normal. Normal strength and tone for age. Mild decrease in right grip strength compared to  left Extremities: No clubbing, cyanosis or edema.   Neuro: Alert to place, but not time. Unable to identify his daughter at bedside Psych:  Good affect, responds inappropriately to some questions    Labs:   Lab Results  Component Value Date   WBC 7.7 04/15/2018   HGB 15.7 04/15/2018   HCT 45.3 04/15/2018   MCV 94.5 04/15/2018   PLT 192 04/15/2018    Recent Labs  Lab 04/15/18 1550  NA 140  K  4.7  CL 103  CO2 30  BUN 13  CREATININE 0.93  CALCIUM 9.2  PROT 6.8  BILITOT 0.4  ALKPHOS 72  ALT 30  AST 26  GLUCOSE 167*   Lab Results  Component Value Date   TROPONINI <0.03 04/15/2018    Lab Results  Component Value Date   CHOL 159 04/16/2018   CHOL 167 02/23/2018   CHOL 167 10/12/2017   Lab Results  Component Value Date   HDL 35 (L) 04/16/2018   HDL 43 02/23/2018   HDL 37.20 (L) 10/12/2017   Lab Results  Component Value Date   LDLCALC 66 04/16/2018   [place] 95 02/23/2018   LDLCALC 95 09/05/2016   Lab Results  Component Value Date   TRIG 288 (H) 04/16/2018   TRIG 144 02/23/2018   TRIG 275.0 (H) 10/12/2017   Lab Results  Component Value Date   CHOLHDL 4.5 04/16/2018   CHOLHDL 3.9 02/23/2018   CHOLHDL 4 10/12/2017   Lab Results  Component Value Date   LDLDIRECT 81.0 10/12/2017   LDLDIRECT 68.0 12/21/2015   LDLDIRECT 80.0 08/20/2015      Radiology: Dg Chest 2 View  Result Date: 04/15/2018 CLINICAL DATA:  Right-sided weakness. EXAM: CHEST - 2 VIEW COMPARISON:  Radiographs of March 29, 2018. FINDINGS: Stable cardiomediastinal silhouette. Status post coronary artery bypass graft. Atherosclerosis of thoracic aorta is noted. No pneumothorax or pleural effusion is noted. No acute pulmonary disease is noted. Bony thorax is unremarkable. IMPRESSION: No active cardiopulmonary disease. Aortic Atherosclerosis (ICD10-I70.0). Electronically Signed   By: Marijo Conception, M.D.   On: 04/15/2018 16:45   Dg Chest 2 View  Result Date: 03/29/2018 CLINICAL DATA:   Pneumonia. EXAM: CHEST - 2 VIEW COMPARISON:  03/28/2018 FINDINGS: Sequelae of prior CABG are again identified. The cardiomediastinal silhouette is unchanged with normal heart size. The lungs are hypoinflated with minimal bibasilar atelectasis, improved from prior. No pleural effusion or pneumothorax is identified. No acute osseous abnormality is seen. IMPRESSION: Hypoinflation with minimal bibasilar atelectasis. Electronically Signed   By: Logan Bores M.D.   On: 03/29/2018 14:41   Dg Chest 2 View  Result Date: 03/28/2018 CLINICAL DATA:  Confusion.  Disorientation. EXAM: CHEST - 2 VIEW COMPARISON:  03/01/2018. FINDINGS: Prior CABG. Heart size normal. Normal pulmonary vascularity. Low lung volumes with mild bibasilar atelectasis/infiltrates. No pleural effusion or pneumothorax. IMPRESSION: 1.  Prior CABG.  Heart size normal. 2.  Low lung volumes with mild bibasilar atelectasis/infiltrates. Electronically Signed   By: Marcello Moores  Register   On: 03/28/2018 13:11   Ct Head Wo Contrast  Result Date: 04/15/2018 CLINICAL DATA:  Progressed confusion and weakness.  Recent stroke. EXAM: CT HEAD WITHOUT CONTRAST TECHNIQUE: Contiguous axial images were obtained from the base of the skull through the vertex without intravenous contrast. COMPARISON:  MRI of the head March 28, 2018 FINDINGS: BRAIN: No intraparenchymal hemorrhage, mass effect nor midline shift. LEFT mesial occipital lobe encephalomalacia with mild ex vacuo dilatation LEFT atrium. Small area contiguous LEFT thalamus encephalomalacia. Old LEFT basal ganglia and LEFT thalamus lacunar infarcts. No global parenchymal brain volume loss for age. Patchy supratentorial white matter hypodensities less than expected for patient's age, though non-specific are most compatible with chronic small vessel ischemic disease. No acute large vascular territory infarcts. No abnormal extra-axial fluid collections. Basal cisterns are patent. VASCULAR: Severe calcific atherosclerosis of  the carotid siphons. SKULL: No skull fracture. No significant scalp soft tissue swelling. SINUSES/ORBITS: Mild paranasal sinus mucosal thickening. Included mastoid air cells  are well aerated.The included ocular globes and orbital contents are non-suspicious. Status post ocular lens implants. OTHER: None. IMPRESSION: 1. No acute intracranial process. 2. Subacute to chronic large LEFT PCA territory infarct. 3. Old LEFT basal ganglia and thalamus lacunar infarcts. Mild chronic small vessel ischemic changes. Electronically Signed   By: Elon Alas M.D.   On: 04/15/2018 16:31   Ct Head Wo Contrast  Result Date: 03/28/2018 CLINICAL DATA:  Stroke.  Confusion EXAM: CT HEAD WITHOUT CONTRAST TECHNIQUE: Contiguous axial images were obtained from the base of the skull through the vertex without intravenous contrast. COMPARISON:  MRI 02/23/2018, CT 02/22/2018 FINDINGS: Brain: Acute infarct left PCA territory. Hypodensity in the left medial temporal lobe extending into the occipital lobe. Left thalamus involved. There has been progression of infarct since the prior MRI which has extended to the occipital lobe. Small area of petechial hemorrhage has developed in the left occipital lobe measuring less than 1 cm Generalized atrophy. Mild chronic microvascular ischemic change in the white matter. Negative for mass lesion. Vascular: Negative for hyperdense vessel Skull: Negative Sinuses/Orbits: Mucosal edema paranasal sinuses. Bilateral cataract surgery Other: None IMPRESSION: Acute infarct left posterior cerebral artery territory has extended into the left occipital lobe since the prior MRI of 02/23/2018. Small subcentimeter area of petechial hemorrhage has developed in the left occipital infarct These results will be called to the ordering clinician or representative by the Radiologist Assistant, and communication documented in the PACS or zVision Dashboard. Electronically Signed   By: Franchot Gallo M.D.   On: 03/28/2018  13:08   Ct Angio Neck W Or Wo Contrast  Result Date: 04/16/2018 CLINICAL DATA:  Initial evaluation for increased confusion and right-sided weakness, recent stroke. EXAM: CT ANGIOGRAPHY NECK TECHNIQUE: Multidetector CT imaging of the neck was performed using the standard protocol during bolus administration of intravenous contrast. Multiplanar CT image reconstructions and MIPs were obtained to evaluate the vascular anatomy. Carotid stenosis measurements (when applicable) are obtained utilizing NASCET criteria, using the distal internal carotid diameter as the denominator. CONTRAST:  66mL OMNIPAQUE IOHEXOL 350 MG/ML SOLN COMPARISON:  Prior ultrasound from 02/23/2018 FINDINGS: Aortic arch: Visualized ascending aorta dilated up to 4.4 cm in diameter. Sequelae of prior CABG partially visualized. Arch itself of relative normal caliber with normal branch pattern. Moderate atheromatous plaque within the visualized aorta and about the origin of the great vessels without hemodynamically significant stenosis. Short-segment stenosis of approximately 80% present at the proximal left subclavian artery just proximal to the takeoff of the left vertebral artery (series 8, image 152). Visualized subclavian arteries otherwise patent without significant stenosis. Right carotid system: Right common carotid artery tortuous proximally. Scattered eccentric calcified plaque throughout the right common carotid artery with stenosis of up to 70% by NASCET criteria (series 7, image 149). Complex irregular plaque about the proximal right ICA with resultant severe stenosis of up to 75% by NASCET criteria (series 7, image 195) and measures approximately 2 cm in length. Right ICA widely patent distally to the skull base without stenosis. Prominent vascular calcifications noted within the carotid siphons. At the bifurcation Left carotid system: Left common carotid artery tortuous proximally. Scattered calcified plaque throughout the left common  carotid artery without significant stenosis. Calcified plaque at the left bifurcation with associated stenosis of up to approximately 70% by NASCET criteria (series 7, image 181). Left ICA widely patent distally to the circle-of-Willis without stenosis or occlusion. Atheromatous plaque noted within the left carotid siphon. Vertebral arteries: Both of the vertebral arteries arise  from the subclavian arteries. Focal plaque at the origin of the vertebral arteries bilaterally with relatively mild stenosis. Vertebral arteries otherwise widely patent within the neck without stenosis or occlusion. Mild plaque within the V4 segments bilaterally without significant stenosis. Vertebral arteries are largely codominant. Skeleton: No acute osseus abnormality. No worrisome lytic or blastic osseous lesions. Moderate cervical spondylolysis noted at C6-7. Median sternotomy wires noted. Other neck: No acute soft tissue abnormality within the neck. No adenopathy. Salivary glands normal. Thyroid normal. Mild chronic maxillary sinusitis. Upper chest: Visualized upper chest and mediastinum demonstrates no acute abnormality. Coronary artery calcifications partially visualized. Scattered atelectatic changes noted within the visualized lungs. IMPRESSION: 1. Extensive atheromatous plaque about the carotid bifurcations/proximal ICAs bilaterally with associated stenosis of up to 75% on the right and 70% on the left. 2. Atheromatous narrowing of up to 70% within the right common carotid artery. 3. Widely patent vertebral arteries within the neck. 4. Short-segment 80% atheromatous stenosis involving the proximal left subclavian artery. 5. Dilatation of the ascending aorta up to 4.4 cm in diameter. Recommend annual imaging followup by CTA or MRA. This recommendation follows 2010 ACCF/AHA/AATS/ACR/ASA/SCA/SCAI/SIR/STS/SVM Guidelines for the Diagnosis and Management of Patients with Thoracic Aortic Disease. Circulation. 2010; 121: [id number]  Electronically Signed   By: Jeannine Boga M.D.   On: 04/16/2018 02:32   Mr Brain Wo Contrast  Result Date: 04/16/2018 CLINICAL DATA:  Initial evaluation for increased confusion, worsened right arm weakness. Recent stroke. EXAM: MRI HEAD WITHOUT CONTRAST MRA HEAD WITHOUT CONTRAST TECHNIQUE: Multiplanar, multiecho pulse sequences of the brain and surrounding structures were obtained without intravenous contrast. Angiographic images of the head were obtained using MRA technique without contrast. COMPARISON:  Prior CT from earlier the same day as well as previous MRI from 02/23/2018. FINDINGS: MRI HEAD FINDINGS Brain: Generalized age-related cerebral atrophy with chronic microvascular ischemic disease again noted, stable. There has been interval evolution of a large left PCA territory infarct, late subacute to chronic in appearance. Overall, size of left PCA territory infarct is increased relative to previous MRI, but is chronic in appearance. Developing encephalomalacia with laminar necrosis seen within the area of infarction. No evidence for hemorrhagic transformation. No significant mass effect. 8 mm acute ischemic cortical infarct seen at the posterior right temporal lobe (series 100, image 28). No associated hemorrhage or mass effect. No other evidence for acute or interval infarct. No evidence for acute or chronic intracranial hemorrhage. No mass lesion, midline shift or mass effect. No hydrocephalus. No extra-axial fluid collection. Pituitary gland within normal limits. Vascular: Major intracranial vascular flow voids are maintained. Skull and upper cervical spine: Craniocervical junction normal. Bone marrow signal intensity within normal limits. No scalp soft tissue abnormality. Sinuses/Orbits: Globes and orbital soft tissues within normal limits. Patient status post ocular lens replacement bilaterally. Small maxillary sinus retention cysts noted paranasal sinuses are otherwise clear. No mastoid  effusion. Inner ear structures normal. Other: None. MRA HEAD FINDINGS ANTERIOR CIRCULATION: Distal cervical segments of the internal carotid arteries are patent with antegrade flow. Petrous segments patent bilaterally. Scattered atheromatous irregularity within the cavernous/supraclinoid ICAs without hemodynamically significant stenosis. A1 segments patent bilaterally. Left A1 hypoplastic, accounting for the diminutive left ICA is compared to the right. Normal anterior communicating artery. Anterior cerebral arteries patent to their distal aspects without stenosis M1 segments patent without stenosis. Normal MCA bifurcations. Distal MCA branches well perfused and symmetric POSTERIOR CIRCULATION: Vertebral arteries patent to the vertebrobasilar junction without stenosis. Patent right PICA. Left PICA not visualized. Dominant left AICA.  Basilar artery widely patent to its distal aspect. basilar patent to its distal aspect without stenosis. Superior cerebral arteries patent bilaterally. Both of the posterior cerebral artery supplied via the basilar. Chronic occlusion of the proximal left PCA with minimal thready flow distally. Right PCA widely patent to its distal aspect without proximal high-grade or correctable stenosis. IMPRESSION: MRI HEAD IMPRESSION: 1. 8 mm acute ischemic nonhemorrhagic right temporal cortical infarct. 2. Interval evolution of large left PCA territory infarct, now late subacute to chronic in appearance. 3. Stable age-related cerebral atrophy with mild chronic small vessel ischemic disease. MRA HEAD IMPRESSION: 1. Chronic proximal left P2 occlusion. 2. Otherwise negative intracranial MRA with relatively minor atherosclerotic change for age. No other proximal high-grade or correctable stenosis identified. Electronically Signed   By: Jeannine Boga M.D.   On: 04/16/2018 01:12   Mr Brain Wo Contrast  Result Date: 03/28/2018 CLINICAL DATA:  Confusion, altered mental status. History of  migraines, hypertension, hypercholesterolemia, diabetes, stroke. EXAM: MRI HEAD WITHOUT CONTRAST TECHNIQUE: Multiplanar, multiecho pulse sequences of the brain and surrounding structures were obtained without intravenous contrast. COMPARISON:  CT HEAD March 28, 2018 and MRI of the head Feb 23, 2018 FINDINGS: Moderately motion degraded examination. INTRACRANIAL CONTENTS: Reduced diffusion LEFT mesial temporal occipital lobes, limited assessment on ADC maps due to motion without convincing low ADC values. New patchy susceptibility artifact LEFT occipital lobe. A few scattered chronic microhemorrhages. No parenchymal brain volume loss for age. No hydrocephalus. Patchy supratentorial white matter FLAIR T2 hyperintensities compatible with mild chronic small vessel ischemic changes. Old LEFT basal ganglia and thalamus infarcts. Prominent basal ganglia T2 hyperintensities seen with chronic small vessel ischemic changes. No abnormal extra-axial fluid collections. VASCULAR: Normal major intracranial vascular flow voids present at skull base. SKULL AND UPPER CERVICAL SPINE: No abnormal sellar expansion. No suspicious calvarial bone marrow signal. Craniocervical junction maintained. SINUSES/ORBITS: Mild paranasal sinus mucosal thickening without Air-fluid levels. Mastoid air cells are well aerated. Status post bilateral ocular lens implants.The included ocular globes and orbital contents are non-suspicious. OTHER: None. IMPRESSION: 1. Motion degraded examination. 2. Further propagation LEFT PCA territory infarct is likely subacute with petechial hemorrhage. 3. Mild chronic small vessel ischemic changes. Old LEFT basal ganglia and thalamus small infarcts. Electronically Signed   By: Elon Alas M.D.   On: 03/28/2018 18:34   Mr Jodene Nam Head/brain EX Cm  Result Date: 04/16/2018 CLINICAL DATA:  Initial evaluation for increased confusion, worsened right arm weakness. Recent stroke. EXAM: MRI HEAD WITHOUT CONTRAST MRA HEAD  WITHOUT CONTRAST TECHNIQUE: Multiplanar, multiecho pulse sequences of the brain and surrounding structures were obtained without intravenous contrast. Angiographic images of the head were obtained using MRA technique without contrast. COMPARISON:  Prior CT from earlier the same day as well as previous MRI from 02/23/2018. FINDINGS: MRI HEAD FINDINGS Brain: Generalized age-related cerebral atrophy with chronic microvascular ischemic disease again noted, stable. There has been interval evolution of a large left PCA territory infarct, late subacute to chronic in appearance. Overall, size of left PCA territory infarct is increased relative to previous MRI, but is chronic in appearance. Developing encephalomalacia with laminar necrosis seen within the area of infarction. No evidence for hemorrhagic transformation. No significant mass effect. 8 mm acute ischemic cortical infarct seen at the posterior right temporal lobe (series 100, image 28). No associated hemorrhage or mass effect. No other evidence for acute or interval infarct. No evidence for acute or chronic intracranial hemorrhage. No mass lesion, midline shift or mass effect. No hydrocephalus. No extra-axial  fluid collection. Pituitary gland within normal limits. Vascular: Major intracranial vascular flow voids are maintained. Skull and upper cervical spine: Craniocervical junction normal. Bone marrow signal intensity within normal limits. No scalp soft tissue abnormality. Sinuses/Orbits: Globes and orbital soft tissues within normal limits. Patient status post ocular lens replacement bilaterally. Small maxillary sinus retention cysts noted paranasal sinuses are otherwise clear. No mastoid effusion. Inner ear structures normal. Other: None. MRA HEAD FINDINGS ANTERIOR CIRCULATION: Distal cervical segments of the internal carotid arteries are patent with antegrade flow. Petrous segments patent bilaterally. Scattered atheromatous irregularity within the  cavernous/supraclinoid ICAs without hemodynamically significant stenosis. A1 segments patent bilaterally. Left A1 hypoplastic, accounting for the diminutive left ICA is compared to the right. Normal anterior communicating artery. Anterior cerebral arteries patent to their distal aspects without stenosis M1 segments patent without stenosis. Normal MCA bifurcations. Distal MCA branches well perfused and symmetric POSTERIOR CIRCULATION: Vertebral arteries patent to the vertebrobasilar junction without stenosis. Patent right PICA. Left PICA not visualized. Dominant left AICA. Basilar artery widely patent to its distal aspect. basilar patent to its distal aspect without stenosis. Superior cerebral arteries patent bilaterally. Both of the posterior cerebral artery supplied via the basilar. Chronic occlusion of the proximal left PCA with minimal thready flow distally. Right PCA widely patent to its distal aspect without proximal high-grade or correctable stenosis. IMPRESSION: MRI HEAD IMPRESSION: 1. 8 mm acute ischemic nonhemorrhagic right temporal cortical infarct. 2. Interval evolution of large left PCA territory infarct, now late subacute to chronic in appearance. 3. Stable age-related cerebral atrophy with mild chronic small vessel ischemic disease. MRA HEAD IMPRESSION: 1. Chronic proximal left P2 occlusion. 2. Otherwise negative intracranial MRA with relatively minor atherosclerotic change for age. No other proximal high-grade or correctable stenosis identified. Electronically Signed   By: Jeannine Boga M.D.   On: 04/16/2018 01:12    EKG: Sinus rhythm with occasional PVCs   ASSESSMENT AND PLAN:   1. Paroxysmal Atrial Fibrillation   - Continue Eliquis 5mg  BID,,will need to be sure he is compliant  - Continue metoprolol 12.5mg  daily   - Monitor on telemetry  2. Acute right temporal lobe infarct   - Continue Eliquis, Lipitor  3. Bilateral carotid stenosis   - Consult with vascular was made   The  history, physical exam findings, and plan of care were discussed with Dr. Bartholome Bill and all decisions were made in collaboration.   Signed: Avie Arenas PA-C 04/16/2018, 2:57 PM

## 2018-04-16 NOTE — Evaluation (Signed)
Physical Therapy Evaluation Patient Details Name: Kyle Richardson MRN: [id number] DOB: 08-22-1928 Today's Date: 04/16/2018   History of Present Illness  84yo male presented to ER secondary to worsening weakness, difficulty with mobility; admitted for TIA/CVA work-up. MRI significant for 85mm infarct to R temporal lobe, subacute/chronic infarcts to L PCA, L BG and L thalamus.  Clinical Impression  Upon evaluation, patient alert and oriented to basic information; follows simple commands, but requires increased time for processing, intermittent cuing for redirection/attention to task.  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  Functional weakness apparent throughout R hemi-body (UE > LE) with moderate deficits in coordination, motor planning/control and overall attention to R hemi-body and visual field.  Completely insensate to light touch throughout R UE/LE, but does detect deep pressure/deep pain when attending to task in isolation.  Currently requiring min assist for bed mobility; mod assist +1-2 for sit/stand, basic transfers and gait (30' x2) with bilat HHA.  High-level pushing behaviors noted with dynamic standing activities, but excellent response and functional improvement noted with manual facilitation for adequate truncal activation/control/alignment with all standing and gait activities. Patient very encouraged by performance during session and very eager/motivated to participate to the best of his ability with any/all tasks asked of him. Multiple friends/family members in during session to support/encourage. Would benefit from skilled PT to address above deficits and promote optimal return to PLOF; recommend transition to acute inpatient rehab upon discharge for high-intensity, post-acute rehab services.      Follow Up Recommendations CIR    Equipment Recommendations       Recommendations  for Other Services       Precautions / Restrictions Precautions Precautions: Fall Restrictions Weight Bearing Restrictions: No      Mobility  Bed Mobility Overal bed mobility: Needs Assistance Bed Mobility: Supine to Sit     Supine to sit: Min assist     General bed mobility comments: deferred, up in recliner   Transfers Overall transfer level: Needs assistance Equipment used: 2 person hand held assist Transfers: Sit to/from Stand Sit to Stand: Min assist;Mod assist;+2 physical assistance Stand pivot transfers: Min guard;Min assist;+2 physical assistance       General transfer comment: increased time, cuing, effort for midline positioning in all planes, as patient with R posterior/lateral trunk lean; mild pusher towards R when challenged   Ambulation/Gait Ambulation/Gait assistance: Mod assist;+2 physical assistance Gait Distance (Feet): 30 Feet(x2) Assistive device: 2 person hand held assist       General Gait Details: R lateral lean (mild pushing) with limited L ant/lateral weight shift throughout gait cycle; results in poor R foot clearance and step length; inconsisent stepping pattern and poor balance.  All improved with mod manual facilitation for L anterior/lateral weight shift.  Intermittent R knee buckling, but improved with cuing for attention to task  Stairs            Wheelchair Mobility    Modified Rankin (Stroke Patients Only)       Balance Overall balance assessment: Needs assistance Sitting-balance support: No upper extremity supported;Feet supported Sitting balance-Leahy Scale: Fair   Postural control: Right lateral lean;Other (comment)(slight R lean, tends to push a little) Standing balance support: Bilateral upper extremity supported Standing balance-Leahy Scale: Poor Standing balance comment: R lateral lean, higher level pushing towards R  Pertinent Vitals/Pain Pain Assessment: 0-10 Pain  Score: 0-No pain    Home Living Family/patient expects to be discharged to:: Private residence Living Arrangements: Spouse/significant other;Children Available Help at Discharge: Family;Personal care attendant(hired aide almost 24/7) Type of Home: House Home Access: Stairs to enter Entrance Stairs-Rails: Can reach both Entrance Stairs-Number of Steps: 2 Home Layout: One level Home Equipment: Grab bars - tub/shower;Shower seat;Wheelchair - manual      Prior Function Level of Independence: Needs assistance   Gait / Transfers Assistance Needed: Prior to recent stroke pt was independent, recently home from inpatient rehab and was able to perform stand pivot transfers to/from wheelchair with assist from CNAs  ADL's / Ages Needed: Since home from inpatient rehab, pt requires assist for toileting, showering, meals, and housekeeping tasks from CNAs. Per dtr, spouse assists with medication mgt.  Comments: At baseline, indep with ADLs, mobility.  Since CVA in 02/2018 (after course of inpatient rehab), patient largely WC level as primary mobility, performing SPT between seating surfaces with +1 assist.  WAs actively participation with [facility] therapies, and was able to participate with gait training, RW and +1.     Hand Dominance   Dominant Hand: Right(RUE weakness)    Extremity/Trunk Assessment   Upper Extremity Assessment Upper Extremity Assessment: (R UE grossly 3-/5, but with moderate ataxia and dysmetria; absent sensation to light touch, able to detect deep pressure at nailbeds. R UE flexor synergy with exertion, relaxes/resolves with rest)     Lower Extremity Assessment Lower Extremity Assessment: (R LE grossl 4-/5, mild ataxia, absent light touch, + babinski.  Positive associated reactions to R LE noted with exertional activities.)        Communication   Communication: (expressive aphasia, confrontational > conversational)  Cognition Arousal/Alertness:  Awake/alert Behavior During Therapy: WFL for tasks assessed/performed Overall Cognitive Status: Impaired/Different from baseline Area of Impairment: Problem solving;Following commands                       Following Commands: Follows one step commands with increased time     Problem Solving: Slow processing;Requires tactile cues;Requires verbal cues;Difficulty sequencing;Decreased initiation General Comments: alert and oriented to basic information; mild difficulty with sustained attention to conversation/task; significant R inattention with suspected hemianopsia      General Comments General comments (skin integrity, edema, etc.): R forearm IV noted to appear infiltrated. RN notified and promptly removed and switched IV to L IV site.    Exercises Other Exercises Other Exercises: Unsupported sitting balance/weight shifting to don bilat socks.  Very limited functional use of R UE with dynamic tasks.  Increased posterior trunk lean/weight shift with dynamc activities, min cuing for correction Other Exercises: Unsupported standing, bilat HHA, worked to develop awareness of midline orientation and self-correction of balance losses.  PAtient aware of weight shift by therapist, but unable to initiate or maintain withoout constant handson assist   Assessment/Plan    PT Assessment Patient needs continued PT services  PT Problem List Decreased strength;Decreased range of motion;Decreased activity tolerance;Decreased balance;Decreased mobility;Decreased coordination;Decreased cognition;Decreased knowledge of use of DME;Decreased knowledge of precautions;Decreased safety awareness;Impaired sensation       PT Treatment Interventions DME instruction;Stair training;Therapeutic activities;Balance training;Cognitive remediation;Gait training;Functional mobility training;Therapeutic exercise;Neuromuscular re-education;Patient/family education    PT Goals (Current goals can be found in the Care  Plan section)  Acute Rehab PT Goals Patient Stated Goal: "feel better so I can eventually go home" PT Goal Formulation: With patient Time For Goal Achievement: 04/30/18 Potential  to Achieve Goals: Good    Frequency 7X/week   Barriers to discharge        Co-evaluation               AM-PAC PT "6 Clicks" Daily Activity  Outcome Measure Difficulty turning over in bed (including adjusting bedclothes, sheets and blankets)?: Unable Difficulty moving from lying on back to sitting on the side of the bed? : Unable Difficulty sitting down on and standing up from a chair with arms (e.g., wheelchair, bedside commode, etc,.)?: Unable Help needed moving to and from a bed to chair (including a wheelchair)?: A Lot Help needed walking in hospital room?: A Lot Help needed climbing 3-5 steps with a railing? : A Lot 6 Click Score: 9    End of Session Equipment Utilized During Treatment: Gait belt Activity Tolerance: Patient tolerated treatment well Patient left: in chair;with call bell/phone within reach;with chair alarm set Nurse Communication: Mobility status PT Visit Diagnosis: Unsteadiness on feet (R26.81);Muscle weakness (generalized) (M62.81);History of falling (Z91.81);Hemiplegia and hemiparesis Hemiplegia - Right/Left: Right Hemiplegia - dominant/non-dominant: Dominant Hemiplegia - caused by: Cerebral infarction    Time: 1610-9604     Charges:   PT Evaluation $PT Eval Moderate Complexity: 1 Mod PT Treatments $Gait Training: 8-22 mins $Neuromuscular Re-education: 8-22 mins   PT G Codes:        Khalani Novoa H. Owens Shark, PT, DPT, NCS 04/16/18, 7:42 PM [phone]

## 2018-04-16 NOTE — Evaluation (Signed)
Occupational Therapy Evaluation Patient Details Name: Kyle Richardson MRN: [id number] DOB: [birth year] Today's Date: 04/16/2018    History of Present Illness 84yo male presented to ER secondary to worsening weakness, difficulty with mobility; admitted for TIA/CVA work-up. MRI significant for 11mm infarct to R temporal lobe, subacute/chronic infarcts to L PCA, L BG and L thalamus.   Clinical Impression   Pt seen for OT evaluation this date. Pt lives with his spouse in a 1 story home with 3 steps to enter with bilateral handrails, walk-in shower with shower chair. Daughter very involved and supportive with pt's care and needs in the home and able to provide assist as needed upon pt's return home. Pt with a previous CVA and recently returned home from inpatient rehab with significant improvements in his ability to perform mobility and ADL tasks with decreased assist. Daughter notes that CNAs have been providing varying assist for transfers from w/c, IADL tasks, and ADL tasks. Daughter reports pt was feeding himself well with his L non-dominant hand and was able to wash a majority of his body with his RUE while taking a seated shower with CNA assist. 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 Pt currently appears to presents with increased cognitive impairment (mildly more confused, requiring additional cues, impaired problem solving, initiation, motor planning, and sequencing) and speech. Pt requiring increased assist for mobility, performing sit to stand transfers with min A +2 and verbal /tactile cues for R hand placement. Pt able to stand at sink with RUE on RW and cues to locate comb on R side of sink and comb his hair with his L hand. Noted mild pushing to R side which pt was able to correct at times with cues. Pt becoming fatigued, sat at sink to brush his teeth with set up and min assist to stabilize toothpaste in his R hand while applying to toothbrush  (initially picking up comb to brush his teeth, then attempting to hold the comb and toothbrush at the same time). Pt noted with improved RUE involvement in grooming tasks while washing his face with a washcloth and using B hands to rinse and wring out cloth in sink with noted ataxic movements with RUE. Pt currently requiring increased assist for mobility and ADL tasks since returning home from rehab following new CVA. Pt will benefit from skilled acute inpatient OT services to maximize return to PLOF with family/caregiver assist. Recommend CIR upon discharge.      Follow Up Recommendations  CIR    Equipment Recommendations  Other (comment)(TBD)    Recommendations for Other Services Rehab consult     Precautions / Restrictions Precautions Precautions: Fall Restrictions Weight Bearing Restrictions: No      Mobility Bed Mobility               General bed mobility comments: deferred, up in recliner   Transfers Overall transfer level: Needs assistance Equipment used: Rolling walker (2 wheeled) Transfers: Sit to/from [facility] Sit to Stand: Min guard;Min assist;+2 physical assistance Stand pivot transfers: Min guard;Min assist;+2 physical assistance       General transfer comment: verbal, tactile, and occasional hand over hand cues for RUE placement to maximize safety    Balance Overall balance assessment: Needs assistance Sitting-balance support: Feet supported;Single extremity supported Sitting balance-Leahy Scale: Fair   Postural control: Right lateral lean;Other (comment)(slight R lean, tends to push a little) Standing balance support: Bilateral upper extremity supported Standing balance-Leahy Scale: Poor  ADL either performed or assessed with clinical judgement   ADL Overall ADL's : Needs assistance/impaired Eating/Feeding: Sitting;Set up;Minimal assistance Eating/Feeding Details (indicate cue type and  reason): With set up and min assist and verbal cues to support incorporation of RUE into task, pt able to open containers/packets. Able to scoop food with spoon and bring to mouth using non-dom hand with minimal spillage Grooming: Sitting;Standing;Minimal assistance;Min guard;Brushing hair;Oral care;Wash/dry face Grooming Details (indicate cue type and reason): Standing at sink with CGA, pt able to locate comb on R side of sink with max verbal cues, comb hair w/ L hand while looking in the mirror. Performed oral care with set up and min assist for R hand to grasp and squeeze toothpaste and using L hand to brush. Dtr noting improvement in ability to brush teeth from previous date.              Lower Body Dressing: Sit to/from stand;Maximal assistance;Min guard Lower Body Dressing Details (indicate cue type and reason): while standing with BUE support on RW and CGA, RN provided max assist to doff wet brief and don clean brief with no significant LOB noted Toilet Transfer: RW;Stand-pivot;Minimal assistance;+2 for physical assistance;BSC;Cueing for safety;Cueing for sequencing   Toileting- Clothing Manipulation and Hygiene: Maximal assistance               Vision Baseline Vision/History: Wears glasses;Glaucoma Wears Glasses: At all times Patient Visual Report: [facility] since previous CVA) Additional Comments: [facility]     Perception     Praxis      Pertinent Vitals/Pain Pain Assessment: (P) No/denies pain     Hand Dominance Right   Extremity/Trunk Assessment Upper Extremity Assessment Upper Extremity Assessment: RUE deficits/detail RUE Deficits / Details: 3+/5 to 4-/5, ataxic, impaired coordination and sensation RUE Sensation: decreased proprioception;decreased light touch RUE Coordination: decreased fine motor;decreased gross motor   Lower Extremity Assessment Lower Extremity Assessment: RLE deficits/detail;Defer to PT evaluation RLE Sensation: decreased light touch;decreased  proprioception RLE Coordination: decreased gross motor       Communication Communication Communication: HOH;Expressive difficulties(bilat hearing aids)   Cognition Arousal/Alertness: Awake/alert Behavior During Therapy: WFL for tasks assessed/performed Overall Cognitive Status: (P) Impaired/Different from baseline(but deficits noted at CVA in 02/2018) Area of Impairment: Problem solving;Following commands                       Following Commands: Follows one step commands with increased time     Problem Solving: Slow processing;Requires tactile cues;Requires verbal cues;Difficulty sequencing;Decreased initiation General Comments: Pt alert and oriented, follows simple commands well with increased time, additional cues and time needed to attend to R side   General Comments  R forearm IV noted to appear infiltrated. RN notified and promptly removed and switched IV to L IV site.    Exercises     Shoulder Instructions      Home Living Family/patient expects to be discharged to:: Private residence Living Arrangements: Spouse/significant other;Children Available Help at Discharge: (P) Family;Home health(aides) Type of Home: (P) House Home Access: Stairs to enter [employer] of Steps: 2 Entrance Stairs-Rails: Can reach both Home Layout: One level     Bathroom Shower/Tub: Occupational psychologist: Standard     Home Equipment: Grab bars - tub/shower;Shower seat;Wheelchair - manual      Lives With: (P) Spouse;Son    Prior Functioning/Environment Level of Independence: Needs assistance  Gait / Transfers Assistance Needed: Prior to recent stroke pt was independent, recently home from  inpatient rehab and was able to perform stand pivot transfers to/from wheelchair with assist from CNAs ADL's / Homemaking Assistance Needed: Since home from inpatient rehab, pt requires assist for toileting, showering, meals, and housekeeping tasks from CNAs. Per dtr,  spouse assists with medication mgt. Communication / Swallowing Assistance Needed: mild expressive difficulties from previous CVA          OT Problem List: Decreased strength;Decreased knowledge of use of DME or AE;Impaired UE functional use;Decreased cognition;Impaired sensation;Decreased safety awareness;Impaired balance (sitting and/or standing);Decreased coordination;Impaired vision/perception      OT Treatment/Interventions: Self-care/ADL training;Balance training;Therapeutic exercise;Neuromuscular education;Therapeutic activities;DME and/or AE instruction;Patient/family education;Visual/perceptual remediation/compensation;Cognitive remediation/compensation    OT Goals(Current goals can be found in the care plan section) Acute Rehab OT Goals Patient Stated Goal: "feel better so I can eventually go home" OT Goal Formulation: With patient/family Time For Goal Achievement: 04/30/18 Potential to Achieve Goals: Good ADL Goals Pt Will Perform Eating: sitting;with set-up;with min guard assist(VCs PRN for RUE involvement, CGA PRN to stabilize items) Pt Will Perform Grooming: sitting;with min assist(Pt will shave face w/ electric razor w/ Min A using RUE) Pt Will Transfer to Toilet: with +2 assist;with min guard assist;with min assist(BSC, LRAD for amb) Additional ADL Goal #1: Pt will brush his teeth and wash his face incorporating RUE into task with min verbal cues and PRN CGA for RUE to maximize independence and minimize caregiver burden.  OT Frequency: Min 3X/week   Barriers to D/C:            Co-evaluation              AM-PAC PT "6 Clicks" Daily Activity     Outcome Measure Help from another person eating meals?: A Little Help from another person taking care of personal grooming?: A Little Help from another person toileting, which includes using toliet, bedpan, or urinal?: A Lot Help from another person bathing (including washing, rinsing, drying)?: A Lot Help from another  person to put on and taking off regular upper body clothing?: A Lot Help from another person to put on and taking off regular lower body clothing?: A Lot 6 Click Score: 14   End of Session Equipment Utilized During Treatment: Gait belt;Rolling walker  Activity Tolerance: Patient tolerated treatment well Patient left: in chair;with call bell/phone within reach;with chair alarm set;with family/visitor present  OT Visit Diagnosis: Other abnormalities of gait and mobility (R26.89);Hemiplegia and hemiparesis;Ataxia, unspecified (R27.0);Apraxia (R48.2);Low vision, both eyes (H54.2) Hemiplegia - Right/Left: Right Hemiplegia - dominant/non-dominant: Dominant Hemiplegia - caused by: Cerebral infarction                Time: 1341-1401 OT Time Calculation (min): 20 min Charges:  OT General Charges $OT Visit: 1 Visit OT Evaluation $OT Eval High Complexity: 1 High OT Treatments $Self Care/Home Management : 8-22 mins  Jeni Salles, MPH, MS, OTR/L ascom [phone] 04/16/18, 5:52 PM

## 2018-04-17 ENCOUNTER — Encounter: Payer: Self-pay | Admitting: Internal Medicine

## 2018-04-17 DIAGNOSIS — I6521 Occlusion and stenosis of right carotid artery: Secondary | ICD-10-CM | POA: Diagnosis not present

## 2018-04-17 DIAGNOSIS — Z9049 Acquired absence of other specified parts of digestive tract: Secondary | ICD-10-CM | POA: Diagnosis not present

## 2018-04-17 DIAGNOSIS — I63432 Cerebral infarction due to embolism of left posterior cerebral artery: Secondary | ICD-10-CM | POA: Diagnosis present

## 2018-04-17 DIAGNOSIS — R4781 Slurred speech: Secondary | ICD-10-CM | POA: Diagnosis present

## 2018-04-17 DIAGNOSIS — E785 Hyperlipidemia, unspecified: Secondary | ICD-10-CM | POA: Diagnosis present

## 2018-04-17 DIAGNOSIS — G4730 Sleep apnea, unspecified: Secondary | ICD-10-CM | POA: Diagnosis present

## 2018-04-17 DIAGNOSIS — I69351 Hemiplegia and hemiparesis following cerebral infarction affecting right dominant side: Secondary | ICD-10-CM | POA: Diagnosis not present

## 2018-04-17 DIAGNOSIS — R29810 Facial weakness: Secondary | ICD-10-CM | POA: Diagnosis present

## 2018-04-17 DIAGNOSIS — E7800 Pure hypercholesterolemia, unspecified: Secondary | ICD-10-CM | POA: Diagnosis present

## 2018-04-17 DIAGNOSIS — Z951 Presence of aortocoronary bypass graft: Secondary | ICD-10-CM | POA: Diagnosis not present

## 2018-04-17 DIAGNOSIS — E1151 Type 2 diabetes mellitus with diabetic peripheral angiopathy without gangrene: Secondary | ICD-10-CM | POA: Diagnosis present

## 2018-04-17 DIAGNOSIS — I10 Essential (primary) hypertension: Secondary | ICD-10-CM | POA: Diagnosis present

## 2018-04-17 DIAGNOSIS — G7000 Myasthenia gravis without (acute) exacerbation: Secondary | ICD-10-CM | POA: Diagnosis present

## 2018-04-17 DIAGNOSIS — M1990 Unspecified osteoarthritis, unspecified site: Secondary | ICD-10-CM | POA: Diagnosis present

## 2018-04-17 DIAGNOSIS — Z888 Allergy status to other drugs, medicaments and biological substances status: Secondary | ICD-10-CM | POA: Diagnosis not present

## 2018-04-17 DIAGNOSIS — I2510 Atherosclerotic heart disease of native coronary artery without angina pectoris: Secondary | ICD-10-CM | POA: Diagnosis present

## 2018-04-17 DIAGNOSIS — I6523 Occlusion and stenosis of bilateral carotid arteries: Secondary | ICD-10-CM | POA: Diagnosis present

## 2018-04-17 DIAGNOSIS — I482 Chronic atrial fibrillation: Secondary | ICD-10-CM | POA: Diagnosis present

## 2018-04-17 DIAGNOSIS — R4701 Aphasia: Secondary | ICD-10-CM | POA: Diagnosis present

## 2018-04-17 DIAGNOSIS — Z7901 Long term (current) use of anticoagulants: Secondary | ICD-10-CM | POA: Diagnosis not present

## 2018-04-17 DIAGNOSIS — Z882 Allergy status to sulfonamides status: Secondary | ICD-10-CM | POA: Diagnosis not present

## 2018-04-17 DIAGNOSIS — Z8249 Family history of ischemic heart disease and other diseases of the circulatory system: Secondary | ICD-10-CM | POA: Diagnosis not present

## 2018-04-17 DIAGNOSIS — Z9861 Coronary angioplasty status: Secondary | ICD-10-CM | POA: Diagnosis not present

## 2018-04-17 DIAGNOSIS — Z79899 Other long term (current) drug therapy: Secondary | ICD-10-CM | POA: Diagnosis not present

## 2018-04-17 DIAGNOSIS — H409 Unspecified glaucoma: Secondary | ICD-10-CM | POA: Diagnosis present

## 2018-04-17 DIAGNOSIS — Z881 Allergy status to other antibiotic agents status: Secondary | ICD-10-CM | POA: Diagnosis not present

## 2018-04-17 LAB — GLUCOSE, CAPILLARY
Glucose-Capillary: 115 mg/dL — ABNORMAL HIGH (ref 70–99)
Glucose-Capillary: 145 mg/dL — ABNORMAL HIGH (ref 70–99)
Glucose-Capillary: 148 mg/dL — ABNORMAL HIGH (ref 70–99)
Glucose-Capillary: 124 mg/dL — ABNORMAL HIGH (ref 70–99)

## 2018-04-17 MED ORDER — DIPHENHYDRAMINE HCL 25 MG PO CAPS
25.0000 mg | ORAL_CAPSULE | Freq: Every evening | ORAL | Status: DC | PRN
  Administered 2018-04-18 – 2018-04-20 (×2): 25 mg via ORAL
  Filled 2018-04-17 (×2): qty 1

## 2018-04-17 NOTE — Assessment & Plan Note (Signed)
Followed by cardiology.  Continue risk factor modification.   

## 2018-04-17 NOTE — Assessment & Plan Note (Signed)
On lipitor.  Follow lipid panel and liver function tests.   

## 2018-04-17 NOTE — Progress Notes (Signed)
Inpatient Rehabilitation  Per PT and OT request, patient was screened by Gunnar Fusi for appropriateness for an Inpatient Acute Rehab consult.  At this time we are recommending an Inpatient Rehab consult.  Will place order and plant to follow up with team today.  Call if questions.    Carmelia Roller., CCC/SLP Admission Coordinator  [place]  Cell [phone]

## 2018-04-17 NOTE — Progress Notes (Signed)
Occupational Therapy Treatment Patient Details Name: Kyle Richardson MRN: [id number] DOB: 1928-02-18 Today's Date: 04/17/2018    History of present illness 84yo male presented to ER secondary to worsening weakness, difficulty with mobility; admitted for TIA/CVA work-up. MRI significant for 76mm infarct to R temporal lobe, subacute/chronic infarcts to L PCA, L BG and L thalamus.   OT comments  Pt seen for OT tx this date. Pt seated EOB with dtr and eager to participate with therapy. Good unsupported sitting balance. Pt noted with improved response time to questions and when responding to commands. Pt eager to sit up in chair in order to spend time with spouse and family members who arrived during session. Pt required cues to scoot EOB, foot placement, and hand placement to maximize safety and bring COG over BOS prior to stand and min-mod assist x1 to perform sit>stand and SPT to recliner. Once in front of the recliner, pt with mild R lateral and posterior lean and slight LOB, requiring min-mod assist from OT to correct and cues to lean forward on RW with good carryover. Pt cued to reach back for arm rest of chair and able to safely sit. Pt demonstrating improved attention to R side with cues and noted to be able to converse more quickly and appropriately. Pt making good progress towards goals; will continue to progress. CIR remains most appropriate recommendation at this time to maximize pt functional independence, minimize falls risk, and minimize caregiver burden.    Follow Up Recommendations  CIR    Equipment Recommendations       Recommendations for Other Services Rehab consult    Precautions / Restrictions Precautions Precautions: Fall Restrictions Weight Bearing Restrictions: No       Mobility Bed Mobility               General bed mobility comments: deferred, pt sitting EOB with dtr upon start of session  Transfers Overall transfer level: Needs assistance Equipment used:  Rolling walker (2 wheeled) Transfers: Sit to/from [facility] Sit to Stand: Min assist;Mod assist Stand pivot transfers: Min assist;Mod assist       General transfer comment: min-mod assist for transfers, verbal cues and hand over hand assist for R hand placement and sequencing     Balance Overall balance assessment: Needs assistance Sitting-balance support: No upper extremity supported;Feet unsupported Sitting balance-Leahy Scale: Fair     Standing balance support: Bilateral upper extremity supported Standing balance-Leahy Scale: Poor Standing balance comment: slight R lat lean and slight posterior lean requiring min-mod assist from OT to correct with verbal cues to lean forward                           ADL either performed or assessed with clinical judgement   ADL                                               Vision Baseline Vision/History: Wears glasses;Glaucoma Wears Glasses: At all times Patient Visual Report: Other (comment)(RHH from previous CVA)     Perception     Praxis      Cognition Arousal/Alertness: Awake/alert Behavior During Therapy: WFL for tasks assessed/performed Overall Cognitive Status: Impaired/Different from baseline Area of Impairment: Problem solving;Following commands  Following Commands: Follows one step commands with increased time     Problem Solving: Slow processing;Requires tactile cues;Requires verbal cues;Difficulty sequencing;Decreased initiation          Exercises     Shoulder Instructions       General Comments      Pertinent Vitals/ Pain       Pain Assessment: No/denies pain  Home Living                                          Prior Functioning/Environment              Frequency  Min 3X/week        Progress Toward Goals  OT Goals(current goals can now be found in the care plan section)  Progress towards OT  goals: Progressing toward goals  Acute Rehab OT Goals Patient Stated Goal: "feel better so I can eventually go home" OT Goal Formulation: With patient/family Time For Goal Achievement: 04/30/18 Potential to Achieve Goals: Good  Plan Discharge plan remains appropriate;Frequency remains appropriate    Co-evaluation                 AM-PAC PT "6 Clicks" Daily Activity     Outcome Measure   Help from another person eating meals?: A Little Help from another person taking care of personal grooming?: A Little Help from another person toileting, which includes using toliet, bedpan, or urinal?: A Lot Help from another person bathing (including washing, rinsing, drying)?: A Lot Help from another person to put on and taking off regular upper body clothing?: A Lot Help from another person to put on and taking off regular lower body clothing?: A Lot 6 Click Score: 14    End of Session Equipment Utilized During Treatment: Gait belt;Rolling walker  OT Visit Diagnosis: Other abnormalities of gait and mobility (R26.89);Hemiplegia and hemiparesis;Ataxia, unspecified (R27.0);Apraxia (R48.2);Low vision, both eyes (H54.2) Hemiplegia - Right/Left: Right Hemiplegia - dominant/non-dominant: Dominant Hemiplegia - caused by: Cerebral infarction   Activity Tolerance Patient tolerated treatment well   Patient Left in chair;with call bell/phone within reach;with chair alarm set;with family/visitor present   Nurse Communication          Time: 1610-9604 OT Time Calculation (min): 26 min  Charges: OT General Charges $OT Visit: 1 Visit OT Treatments $Self Care/Home Management : 23-37 mins  Jeni Salles, MPH, MS, OTR/L ascom [phone] 04/17/18, 4:04 PM

## 2018-04-17 NOTE — Progress Notes (Signed)
[facility] Cardiology  SUBJECTIVE: [name] is an 84 year old male with past medical history significant for CAD status post PTCA, CABG, type 2 diabetes, hypertension, hyperlipidemia, paroxysmal atrial fibrillation, and sleep apnea who was recently discharged from [facility] on 5/22 following an ischemic left PCA stroke.  On 04/15/2018, patient was admitted for right-sided weakness and confusion.  Today, patient is confused but daughter denies patient complaining of any chest pain, shortness of breath, palpitations, or lower extremity edema.   Vascular was consulted and right internal carotid artery stenosis of 75-85% was noted. Surgery/stenting was the recommendation following angiography.    Vitals:   04/16/18 2015 04/17/18 0016 04/17/18 0406 04/17/18 0800  BP: (!) 160/82 122/81 (!) 145/48 (!) 143/62  Pulse: 61 (!) 57 (!) 49 (!) 51  Resp: 16 16 16 16   Temp: 97.9 F (36.6 C) 97.7 F (36.5 C) 98.6 F (37 C) (!) 97.5 F (36.4 C)  TempSrc: Axillary Oral Oral Oral  SpO2: 95% 98% 96% 97%  Weight:      Height:         Intake/Output Summary (Last 24 hours) at 04/17/2018 0837 Last data filed at 04/16/2018 1258 Gross per 24 hour  Intake -  Output 500 ml  Net -500 ml      PHYSICAL EXAM  General: Well developed, well nourished, in no acute distress HEENT:  Normocephalic and atramatic Neck:  No JVD.  Lungs: Clear bilaterally to auscultation and percussion. Heart: HRRR . Normal S1 and S2 without gallops or murmurs.  Abdomen: Bowel sounds are positive, abdomen soft and non-tender  Msk:  Back normal. Mild decrease in right grip strength compared to left.  Extremities: No clubbing, cyanosis or edema.   Neuro: Alert to place, but not time. Unable to identify his daughter at bedside.  Psych:  Good affect, responds inappropriately to most questions    LABS: Basic Metabolic Panel: Recent Labs    04/15/18 1550  NA 140  K 4.7  CL 103  CO2 30  GLUCOSE 167*  BUN 13  CREATININE 0.93  CALCIUM 9.2    Liver Function Tests: Recent Labs    04/15/18 1550  AST 26  ALT 30  ALKPHOS 72  BILITOT 0.4  PROT 6.8  ALBUMIN 3.7   No results for input(s): LIPASE, AMYLASE in the last 72 hours. CBC: Recent Labs    04/15/18 1550  WBC 7.7  HGB 15.7  HCT 45.3  MCV 94.5  PLT 192   Cardiac Enzymes: Recent Labs    04/15/18 1550  TROPONINI <0.03   BNP: Invalid input(s): POCBNP D-Dimer: No results for input(s): DDIMER in the last 72 hours. Hemoglobin A1C: Recent Labs    04/16/18 0558  HGBA1C 6.8*   Fasting Lipid Panel: Recent Labs    04/16/18 0558  CHOL 159  HDL 35*  LDLCALC 66  TRIG 288*  CHOLHDL 4.5   Thyroid Function Tests: No results for input(s): TSH, T4TOTAL, T3FREE, THYROIDAB in the last 72 hours.  Invalid input(s): FREET3 Anemia Panel: No results for input(s): VITAMINB12, FOLATE, FERRITIN, TIBC, IRON, RETICCTPCT in the last 72 hours.  Dg Chest 2 View  Result Date: 04/15/2018 CLINICAL DATA:  Right-sided weakness. EXAM: CHEST - 2 VIEW COMPARISON:  Radiographs of March 29, 2018. FINDINGS: Stable cardiomediastinal silhouette. Status post coronary artery bypass graft. Atherosclerosis of thoracic aorta is noted. No pneumothorax or pleural effusion is noted. No acute pulmonary disease is noted. Bony thorax is unremarkable. IMPRESSION: No active cardiopulmonary disease. Aortic Atherosclerosis (ICD10-I70.0). Electronically Signed  By: Marijo Conception, M.D.   On: 04/15/2018 16:45   Ct Head Wo Contrast  Result Date: 04/15/2018 CLINICAL DATA:  Progressed confusion and weakness.  Recent stroke. EXAM: CT HEAD WITHOUT CONTRAST TECHNIQUE: Contiguous axial images were obtained from the base of the skull through the vertex without intravenous contrast. COMPARISON:  MRI of the head March 28, 2018 FINDINGS: BRAIN: No intraparenchymal hemorrhage, mass effect nor midline shift. LEFT mesial occipital lobe encephalomalacia with mild ex vacuo dilatation LEFT atrium. Small area contiguous LEFT  thalamus encephalomalacia. Old LEFT basal ganglia and LEFT thalamus lacunar infarcts. No global parenchymal brain volume loss for age. Patchy supratentorial white matter hypodensities less than expected for patient's age, though non-specific are most compatible with chronic small vessel ischemic disease. No acute large vascular territory infarcts. No abnormal extra-axial fluid collections. Basal cisterns are patent. VASCULAR: Severe calcific atherosclerosis of the carotid siphons. SKULL: No skull fracture. No significant scalp soft tissue swelling. SINUSES/ORBITS: Mild paranasal sinus mucosal thickening. Included mastoid air cells are well aerated.The included ocular globes and orbital contents are non-suspicious. Status post ocular lens implants. OTHER: None. IMPRESSION: 1. No acute intracranial process. 2. Subacute to chronic large LEFT PCA territory infarct. 3. Old LEFT basal ganglia and thalamus lacunar infarcts. Mild chronic small vessel ischemic changes. Electronically Signed   By: Elon Alas M.D.   On: 04/15/2018 16:31   Ct Angio Neck W Or Wo Contrast  Result Date: 04/16/2018 CLINICAL DATA:  Initial evaluation for increased confusion and right-sided weakness, recent stroke. EXAM: CT ANGIOGRAPHY NECK TECHNIQUE: Multidetector CT imaging of the neck was performed using the standard protocol during bolus administration of intravenous contrast. Multiplanar CT image reconstructions and MIPs were obtained to evaluate the vascular anatomy. Carotid stenosis measurements (when applicable) are obtained utilizing NASCET criteria, using the distal internal carotid diameter as the denominator. CONTRAST:  103mL OMNIPAQUE IOHEXOL 350 MG/ML SOLN COMPARISON:  Prior ultrasound from 02/23/2018 FINDINGS: Aortic arch: Visualized ascending aorta dilated up to 4.4 cm in diameter. Sequelae of prior CABG partially visualized. Arch itself of relative normal caliber with normal branch pattern. Moderate atheromatous plaque  within the visualized aorta and about the origin of the great vessels without hemodynamically significant stenosis. Short-segment stenosis of approximately 80% present at the proximal left subclavian artery just proximal to the takeoff of the left vertebral artery (series 8, image 152). Visualized subclavian arteries otherwise patent without significant stenosis. Right carotid system: Right common carotid artery tortuous proximally. Scattered eccentric calcified plaque throughout the right common carotid artery with stenosis of up to 70% by NASCET criteria (series 7, image 149). Complex irregular plaque about the proximal right ICA with resultant severe stenosis of up to 75% by NASCET criteria (series 7, image 195) and measures approximately 2 cm in length. Right ICA widely patent distally to the skull base without stenosis. Prominent vascular calcifications noted within the carotid siphons. At the bifurcation Left carotid system: Left common carotid artery tortuous proximally. Scattered calcified plaque throughout the left common carotid artery without significant stenosis. Calcified plaque at the left bifurcation with associated stenosis of up to approximately 70% by NASCET criteria (series 7, image 181). Left ICA widely patent distally to the circle-of-Willis without stenosis or occlusion. Atheromatous plaque noted within the left carotid siphon. Vertebral arteries: Both of the vertebral arteries arise from the subclavian arteries. Focal plaque at the origin of the vertebral arteries bilaterally with relatively mild stenosis. Vertebral arteries otherwise widely patent within the neck without stenosis or occlusion. Mild plaque  within the V4 segments bilaterally without significant stenosis. Vertebral arteries are largely codominant. Skeleton: No acute osseus abnormality. No worrisome lytic or blastic osseous lesions. Moderate cervical spondylolysis noted at C6-7. Median sternotomy wires noted. Other neck: No acute  soft tissue abnormality within the neck. No adenopathy. Salivary glands normal. Thyroid normal. Mild chronic maxillary sinusitis. Upper chest: Visualized upper chest and mediastinum demonstrates no acute abnormality. Coronary artery calcifications partially visualized. Scattered atelectatic changes noted within the visualized lungs. IMPRESSION: 1. Extensive atheromatous plaque about the carotid bifurcations/proximal ICAs bilaterally with associated stenosis of up to 75% on the right and 70% on the left. 2. Atheromatous narrowing of up to 70% within the right common carotid artery. 3. Widely patent vertebral arteries within the neck. 4. Short-segment 80% atheromatous stenosis involving the proximal left subclavian artery. 5. Dilatation of the ascending aorta up to 4.4 cm in diameter. Recommend annual imaging followup by CTA or MRA. This recommendation follows 2010 ACCF/AHA/AATS/ACR/ASA/SCA/SCAI/SIR/STS/SVM Guidelines for the Diagnosis and Management of Patients with Thoracic Aortic Disease. Circulation. 2010; 121: [id number] Electronically Signed   By: Jeannine Boga M.D.   On: 04/16/2018 02:32   Mr Brain Wo Contrast  Result Date: 04/16/2018 CLINICAL DATA:  Initial evaluation for increased confusion, worsened right arm weakness. Recent stroke. EXAM: MRI HEAD WITHOUT CONTRAST MRA HEAD WITHOUT CONTRAST TECHNIQUE: Multiplanar, multiecho pulse sequences of the brain and surrounding structures were obtained without intravenous contrast. Angiographic images of the head were obtained using MRA technique without contrast. COMPARISON:  Prior CT from earlier the same day as well as previous MRI from 02/23/2018. FINDINGS: MRI HEAD FINDINGS Brain: Generalized age-related cerebral atrophy with chronic microvascular ischemic disease again noted, stable. There has been interval evolution of a large left PCA territory infarct, late subacute to chronic in appearance. Overall, size of left PCA territory infarct is increased  relative to previous MRI, but is chronic in appearance. Developing encephalomalacia with laminar necrosis seen within the area of infarction. No evidence for hemorrhagic transformation. No significant mass effect. 8 mm acute ischemic cortical infarct seen at the posterior right temporal lobe (series 100, image 28). No associated hemorrhage or mass effect. No other evidence for acute or interval infarct. No evidence for acute or chronic intracranial hemorrhage. No mass lesion, midline shift or mass effect. No hydrocephalus. No extra-axial fluid collection. Pituitary gland within normal limits. Vascular: Major intracranial vascular flow voids are maintained. Skull and upper cervical spine: Craniocervical junction normal. Bone marrow signal intensity within normal limits. No scalp soft tissue abnormality. Sinuses/Orbits: Globes and orbital soft tissues within normal limits. Patient status post ocular lens replacement bilaterally. Small maxillary sinus retention cysts noted paranasal sinuses are otherwise clear. No mastoid effusion. Inner ear structures normal. Other: None. MRA HEAD FINDINGS ANTERIOR CIRCULATION: Distal cervical segments of the internal carotid arteries are patent with antegrade flow. Petrous segments patent bilaterally. Scattered atheromatous irregularity within the cavernous/supraclinoid ICAs without hemodynamically significant stenosis. A1 segments patent bilaterally. Left A1 hypoplastic, accounting for the diminutive left ICA is compared to the right. Normal anterior communicating artery. Anterior cerebral arteries patent to their distal aspects without stenosis M1 segments patent without stenosis. Normal MCA bifurcations. Distal MCA branches well perfused and symmetric POSTERIOR CIRCULATION: Vertebral arteries patent to the vertebrobasilar junction without stenosis. Patent right PICA. Left PICA not visualized. Dominant left AICA. Basilar artery widely patent to its distal aspect. basilar patent to  its distal aspect without stenosis. Superior cerebral arteries patent bilaterally. Both of the posterior cerebral artery supplied via the basilar.  Chronic occlusion of the proximal left PCA with minimal thready flow distally. Right PCA widely patent to its distal aspect without proximal high-grade or correctable stenosis. IMPRESSION: MRI HEAD IMPRESSION: 1. 8 mm acute ischemic nonhemorrhagic right temporal cortical infarct. 2. Interval evolution of large left PCA territory infarct, now late subacute to chronic in appearance. 3. Stable age-related cerebral atrophy with mild chronic small vessel ischemic disease. MRA HEAD IMPRESSION: 1. Chronic proximal left P2 occlusion. 2. Otherwise negative intracranial MRA with relatively minor atherosclerotic change for age. No other proximal high-grade or correctable stenosis identified. Electronically Signed   By: Jeannine Boga M.D.   On: 04/16/2018 01:12   Mr Jodene Nam Head/brain GL Cm  Result Date: 04/16/2018 CLINICAL DATA:  Initial evaluation for increased confusion, worsened right arm weakness. Recent stroke. EXAM: MRI HEAD WITHOUT CONTRAST MRA HEAD WITHOUT CONTRAST TECHNIQUE: Multiplanar, multiecho pulse sequences of the brain and surrounding structures were obtained without intravenous contrast. Angiographic images of the head were obtained using MRA technique without contrast. COMPARISON:  Prior CT from earlier the same day as well as previous MRI from 02/23/2018. FINDINGS: MRI HEAD FINDINGS Brain: Generalized age-related cerebral atrophy with chronic microvascular ischemic disease again noted, stable. There has been interval evolution of a large left PCA territory infarct, late subacute to chronic in appearance. Overall, size of left PCA territory infarct is increased relative to previous MRI, but is chronic in appearance. Developing encephalomalacia with laminar necrosis seen within the area of infarction. No evidence for hemorrhagic transformation. No significant  mass effect. 8 mm acute ischemic cortical infarct seen at the posterior right temporal lobe (series 100, image 28). No associated hemorrhage or mass effect. No other evidence for acute or interval infarct. No evidence for acute or chronic intracranial hemorrhage. No mass lesion, midline shift or mass effect. No hydrocephalus. No extra-axial fluid collection. Pituitary gland within normal limits. Vascular: Major intracranial vascular flow voids are maintained. Skull and upper cervical spine: Craniocervical junction normal. Bone marrow signal intensity within normal limits. No scalp soft tissue abnormality. Sinuses/Orbits: Globes and orbital soft tissues within normal limits. Patient status post ocular lens replacement bilaterally. Small maxillary sinus retention cysts noted paranasal sinuses are otherwise clear. No mastoid effusion. Inner ear structures normal. Other: None. MRA HEAD FINDINGS ANTERIOR CIRCULATION: Distal cervical segments of the internal carotid arteries are patent with antegrade flow. Petrous segments patent bilaterally. Scattered atheromatous irregularity within the cavernous/supraclinoid ICAs without hemodynamically significant stenosis. A1 segments patent bilaterally. Left A1 hypoplastic, accounting for the diminutive left ICA is compared to the right. Normal anterior communicating artery. Anterior cerebral arteries patent to their distal aspects without stenosis M1 segments patent without stenosis. Normal MCA bifurcations. Distal MCA branches well perfused and symmetric POSTERIOR CIRCULATION: Vertebral arteries patent to the vertebrobasilar junction without stenosis. Patent right PICA. Left PICA not visualized. Dominant left AICA. Basilar artery widely patent to its distal aspect. basilar patent to its distal aspect without stenosis. Superior cerebral arteries patent bilaterally. Both of the posterior cerebral artery supplied via the basilar. Chronic occlusion of the proximal left PCA with  minimal thready flow distally. Right PCA widely patent to its distal aspect without proximal high-grade or correctable stenosis. IMPRESSION: MRI HEAD IMPRESSION: 1. 8 mm acute ischemic nonhemorrhagic right temporal cortical infarct. 2. Interval evolution of large left PCA territory infarct, now late subacute to chronic in appearance. 3. Stable age-related cerebral atrophy with mild chronic small vessel ischemic disease. MRA HEAD IMPRESSION: 1. Chronic proximal left P2 occlusion. 2. Otherwise negative intracranial MRA  with relatively minor atherosclerotic change for age. No other proximal high-grade or correctable stenosis identified. Electronically Signed   By: Jeannine Boga M.D.   On: 04/16/2018 01:12    Echo: 5/19 showed normal EF of 60-65% with mild LVH.   ASSESSMENT AND PLAN:  Principal Problem:   TIA (transient ischemic attack)    1. Paroxysmal Atrial Fibrillation   - Continue Eliquis 5mg  BID; make sure patient is compliant   - Continue metoprolol 12.5mg  daily  2. Acute right temporal infarct  - Continue with Eliquis, Lipitor  3. Bilateral carotid stenosis   - Patient seen by Dr. Delana Meyer  - Agree with recommendation of angiography and possible stent/surgery of right internal carotid   The history, physical exam findings, and plan of care were discussed with Dr. Bartholome Bill and all decisions were made in collaboration.    Avie Arenas  PA-C 04/17/2018 8:37 AM

## 2018-04-17 NOTE — Progress Notes (Signed)
[name] [place] NAME: Kyle Richardson    MR#:  [id number]  DATE OF BIRTH:  [birth year]  SUBJECTIVE: seen at bedside, complaints of right hand numbness, trouble with eating with the coordination. Patient usually use with left hand, noted to have coordination issues this morning.   CHIEF COMPLAINT:   Chief Complaint  Patient presents with  . Weakness    REVIEW OF SYSTEMS:   ROS  patient  Has slurred speech but denies any complaints CONSTITUTIONAL: No fever, fatigue or weakness.  EYES: No blurred or double vision.  EARS, NOSE, AND THROAT: No tinnitus or ear pain.  RESPIRATORY: No cough, shortness of breath, wheezing or hemoptysis.  CARDIOVASCULAR: No chest pain, orthopnea, edema.  GASTROINTESTINAL: No nausea, vomiting, diarrhea or abdominal pain.  GENITOURINARY: No dysuria, hematuria.  ENDOCRINE: No polyuria, nocturia,  HEMATOLOGY: No anemia, easy bruising or bleeding SKIN: No rash or lesion. MUSCULOSKELETAL: No joint pain or arthritis.   NEUROLOGIC: tingling of right hand,, coordination problem with left hand  PSYCHIATRY: No anxiety or depression.   DRUG ALLERGIES:   Allergies  Allergen Reactions  . Clarithromycin Other (See Comments) and Diarrhea  . Codeine   . Flomax [Tamsulosin Hcl]   . Lamisil [Terbinafine]   . Levofloxacin Other (See Comments)    Diplopia. NEVER put patient on levoquin!  . Sertraline Other (See Comments)  . Sulfa Antibiotics     VITALS:  Blood pressure (!) 141/78, pulse 69, temperature (!) 97.5 F (36.4 C), temperature source Oral, resp. rate 18, height 5\' 8"  (1.727 m), weight 73.6 kg (162 lb 4.8 oz), SpO2 97 %.  PHYSICAL EXAMINATION:  GENERAL:  84 y.o.-year-old patient sitting in chair without distress and working with physical therapy. Has some slurred speech and not able to follow commands  EYES: Pupils equal, round, reactive to light and accommodation. No scleral icterus. Extraocular  muscles intact.  HEENT: Head atraumatic, normocephalic. Oropharynx and nasopharynx clear.  NECK:  Supple, no jugular venous distention. No thyroid enlargement, no tenderness.  LUNGS: Normal breath sounds bilaterally, no wheezing, rales,rhonchi or crepitation. No use of accessory muscles of respiration.  CARDIOVASCULAR: S1, S2 normal. No murmurs, rubs, or gallops.  ABDOMEN: Soft, nontender, nondistended. Bowel sounds present. No organomegaly or mass.  EXTREMITIES: No pedal edema, cyanosis, or clubbing.  NEUROLOGIC:  decreased hearing. Motor exam 5/5 upper, lower extremities on right and left. DTR t 2+ bilaterally., No obvious neurological deficit except some subjective feeling of tingling of right hand, decreased coordination with left hand. PSYCHIATRIC: The patient is alert and oriented x 3. Slurred speech observed. SKIN: No obvious rash, lesion, or ulcer.    LABORATORY PANEL:   CBC Recent Labs  Lab 04/15/18 1550  WBC 7.7  HGB 15.7  HCT 45.3  PLT 192   ------------------------------------------------------------------------------------------------------------------  Chemistries  Recent Labs  Lab 04/15/18 1550  NA 140  K 4.7  CL 103  CO2 30  GLUCOSE 167*  BUN 13  CREATININE 0.93  CALCIUM 9.2  AST 26  ALT 30  ALKPHOS 72  BILITOT 0.4   ------------------------------------------------------------------------------------------------------------------  Cardiac Enzymes Recent Labs  Lab 04/15/18 1550  TROPONINI <0.03   ------------------------------------------------------------------------------------------------------------------  RADIOLOGY:  Dg Chest 2 View  Result Date: 04/15/2018 CLINICAL DATA:  Right-sided weakness. EXAM: CHEST - 2 VIEW COMPARISON:  Radiographs of March 29, 2018. FINDINGS: Stable cardiomediastinal silhouette. Status post coronary artery bypass graft. Atherosclerosis of thoracic aorta is noted. No pneumothorax or pleural effusion is noted. No acute  pulmonary disease is noted. Bony thorax is unremarkable. IMPRESSION: No active cardiopulmonary disease. Aortic Atherosclerosis (ICD10-I70.0). Electronically Signed   By: Marijo Conception, M.D.   On: 04/15/2018 16:45   Ct Head Wo Contrast  Result Date: 04/15/2018 CLINICAL DATA:  Progressed confusion and weakness.  Recent stroke. EXAM: CT HEAD WITHOUT CONTRAST TECHNIQUE: Contiguous axial images were obtained from the base of the skull through the vertex without intravenous contrast. COMPARISON:  MRI of the head March 28, 2018 FINDINGS: BRAIN: No intraparenchymal hemorrhage, mass effect nor midline shift. LEFT mesial occipital lobe encephalomalacia with mild ex vacuo dilatation LEFT atrium. Small area contiguous LEFT thalamus encephalomalacia. Old LEFT basal ganglia and LEFT thalamus lacunar infarcts. No global parenchymal brain volume loss for age. Patchy supratentorial white matter hypodensities less than expected for patient's age, though non-specific are most compatible with chronic small vessel ischemic disease. No acute large vascular territory infarcts. No abnormal extra-axial fluid collections. Basal cisterns are patent. VASCULAR: Severe calcific atherosclerosis of the carotid siphons. SKULL: No skull fracture. No significant scalp soft tissue swelling. SINUSES/ORBITS: Mild paranasal sinus mucosal thickening. Included mastoid air cells are well aerated.The included ocular globes and orbital contents are non-suspicious. Status post ocular lens implants. OTHER: None. IMPRESSION: 1. No acute intracranial process. 2. Subacute to chronic large LEFT PCA territory infarct. 3. Old LEFT basal ganglia and thalamus lacunar infarcts. Mild chronic small vessel ischemic changes. Electronically Signed   By: Elon Alas M.D.   On: 04/15/2018 16:31   Ct Angio Neck W Or Wo Contrast  Result Date: 04/16/2018 CLINICAL DATA:  Initial evaluation for increased confusion and right-sided weakness, recent stroke. EXAM: CT  ANGIOGRAPHY NECK TECHNIQUE: Multidetector CT imaging of the neck was performed using the standard protocol during bolus administration of intravenous contrast. Multiplanar CT image reconstructions and MIPs were obtained to evaluate the vascular anatomy. Carotid stenosis measurements (when applicable) are obtained utilizing NASCET criteria, using the distal internal carotid diameter as the denominator. CONTRAST:  45mL OMNIPAQUE IOHEXOL 350 MG/ML SOLN COMPARISON:  Prior ultrasound from 02/23/2018 FINDINGS: Aortic arch: Visualized ascending aorta dilated up to 4.4 cm in diameter. Sequelae of prior CABG partially visualized. Arch itself of relative normal caliber with normal branch pattern. Moderate atheromatous plaque within the visualized aorta and about the origin of the great vessels without hemodynamically significant stenosis. Short-segment stenosis of approximately 80% present at the proximal left subclavian artery just proximal to the takeoff of the left vertebral artery (series 8, image 152). Visualized subclavian arteries otherwise patent without significant stenosis. Right carotid system: Right common carotid artery tortuous proximally. Scattered eccentric calcified plaque throughout the right common carotid artery with stenosis of up to 70% by NASCET criteria (series 7, image 149). Complex irregular plaque about the proximal right ICA with resultant severe stenosis of up to 75% by NASCET criteria (series 7, image 195) and measures approximately 2 cm in length. Right ICA widely patent distally to the skull base without stenosis. Prominent vascular calcifications noted within the carotid siphons. At the bifurcation Left carotid system: Left common carotid artery tortuous proximally. Scattered calcified plaque throughout the left common carotid artery without significant stenosis. Calcified plaque at the left bifurcation with associated stenosis of up to approximately 70% by NASCET criteria (series 7, image  181). Left ICA widely patent distally to the circle-of-Willis without stenosis or occlusion. Atheromatous plaque noted within the left carotid siphon. Vertebral arteries: Both of the vertebral arteries arise from the subclavian arteries. Focal plaque at the origin of the vertebral  arteries bilaterally with relatively mild stenosis. Vertebral arteries otherwise widely patent within the neck without stenosis or occlusion. Mild plaque within the V4 segments bilaterally without significant stenosis. Vertebral arteries are largely codominant. Skeleton: No acute osseus abnormality. No worrisome lytic or blastic osseous lesions. Moderate cervical spondylolysis noted at C6-7. Median sternotomy wires noted. Other neck: No acute soft tissue abnormality within the neck. No adenopathy. Salivary glands normal. Thyroid normal. Mild chronic maxillary sinusitis. Upper chest: Visualized upper chest and mediastinum demonstrates no acute abnormality. Coronary artery calcifications partially visualized. Scattered atelectatic changes noted within the visualized lungs. IMPRESSION: 1. Extensive atheromatous plaque about the carotid bifurcations/proximal ICAs bilaterally with associated stenosis of up to 75% on the right and 70% on the left. 2. Atheromatous narrowing of up to 70% within the right common carotid artery. 3. Widely patent vertebral arteries within the neck. 4. Short-segment 80% atheromatous stenosis involving the proximal left subclavian artery. 5. Dilatation of the ascending aorta up to 4.4 cm in diameter. Recommend annual imaging followup by CTA or MRA. This recommendation follows 2010 ACCF/AHA/AATS/ACR/ASA/SCA/SCAI/SIR/STS/SVM Guidelines for the Diagnosis and Management of Patients with Thoracic Aortic Disease. Circulation. 2010; 121: E268-T419 Electronically Signed   By: Jeannine Boga M.D.   On: 04/16/2018 02:32   Mr Brain Wo Contrast  Result Date: 04/16/2018 CLINICAL DATA:  Initial evaluation for increased  confusion, worsened right arm weakness. Recent stroke. EXAM: MRI HEAD WITHOUT CONTRAST MRA HEAD WITHOUT CONTRAST TECHNIQUE: Multiplanar, multiecho pulse sequences of the brain and surrounding structures were obtained without intravenous contrast. Angiographic images of the head were obtained using MRA technique without contrast. COMPARISON:  Prior CT from earlier the same day as well as previous MRI from 02/23/2018. FINDINGS: MRI HEAD FINDINGS Brain: Generalized age-related cerebral atrophy with chronic microvascular ischemic disease again noted, stable. There has been interval evolution of a large left PCA territory infarct, late subacute to chronic in appearance. Overall, size of left PCA territory infarct is increased relative to previous MRI, but is chronic in appearance. Developing encephalomalacia with laminar necrosis seen within the area of infarction. No evidence for hemorrhagic transformation. No significant mass effect. 8 mm acute ischemic cortical infarct seen at the posterior right temporal lobe (series 100, image 28). No associated hemorrhage or mass effect. No other evidence for acute or interval infarct. No evidence for acute or chronic intracranial hemorrhage. No mass lesion, midline shift or mass effect. No hydrocephalus. No extra-axial fluid collection. Pituitary gland within normal limits. Vascular: Major intracranial vascular flow voids are maintained. Skull and upper cervical spine: Craniocervical junction normal. Bone marrow signal intensity within normal limits. No scalp soft tissue abnormality. Sinuses/Orbits: Globes and orbital soft tissues within normal limits. Patient status post ocular lens replacement bilaterally. Small maxillary sinus retention cysts noted paranasal sinuses are otherwise clear. No mastoid effusion. Inner ear structures normal. Other: None. MRA HEAD FINDINGS ANTERIOR CIRCULATION: Distal cervical segments of the internal carotid arteries are patent with antegrade flow.  Petrous segments patent bilaterally. Scattered atheromatous irregularity within the cavernous/supraclinoid ICAs without hemodynamically significant stenosis. A1 segments patent bilaterally. Left A1 hypoplastic, accounting for the diminutive left ICA is compared to the right. Normal anterior communicating artery. Anterior cerebral arteries patent to their distal aspects without stenosis M1 segments patent without stenosis. Normal MCA bifurcations. Distal MCA branches well perfused and symmetric POSTERIOR CIRCULATION: Vertebral arteries patent to the vertebrobasilar junction without stenosis. Patent right PICA. Left PICA not visualized. Dominant left AICA. Basilar artery widely patent to its distal aspect. basilar patent to its  distal aspect without stenosis. Superior cerebral arteries patent bilaterally. Both of the posterior cerebral artery supplied via the basilar. Chronic occlusion of the proximal left PCA with minimal thready flow distally. Right PCA widely patent to its distal aspect without proximal high-grade or correctable stenosis. IMPRESSION: MRI HEAD IMPRESSION: 1. 8 mm acute ischemic nonhemorrhagic right temporal cortical infarct. 2. Interval evolution of large left PCA territory infarct, now late subacute to chronic in appearance. 3. Stable age-related cerebral atrophy with mild chronic small vessel ischemic disease. MRA HEAD IMPRESSION: 1. Chronic proximal left P2 occlusion. 2. Otherwise negative intracranial MRA with relatively minor atherosclerotic change for age. No other proximal high-grade or correctable stenosis identified. Electronically Signed   By: Jeannine Boga M.D.   On: 04/16/2018 01:12   Mr Jodene Nam Head/brain NF Cm  Result Date: 04/16/2018 CLINICAL DATA:  Initial evaluation for increased confusion, worsened right arm weakness. Recent stroke. EXAM: MRI HEAD WITHOUT CONTRAST MRA HEAD WITHOUT CONTRAST TECHNIQUE: Multiplanar, multiecho pulse sequences of the brain and surrounding  structures were obtained without intravenous contrast. Angiographic images of the head were obtained using MRA technique without contrast. COMPARISON:  Prior CT from earlier the same day as well as previous MRI from 02/23/2018. FINDINGS: MRI HEAD FINDINGS Brain: Generalized age-related cerebral atrophy with chronic microvascular ischemic disease again noted, stable. There has been interval evolution of a large left PCA territory infarct, late subacute to chronic in appearance. Overall, size of left PCA territory infarct is increased relative to previous MRI, but is chronic in appearance. Developing encephalomalacia with laminar necrosis seen within the area of infarction. No evidence for hemorrhagic transformation. No significant mass effect. 8 mm acute ischemic cortical infarct seen at the posterior right temporal lobe (series 100, image 28). No associated hemorrhage or mass effect. No other evidence for acute or interval infarct. No evidence for acute or chronic intracranial hemorrhage. No mass lesion, midline shift or mass effect. No hydrocephalus. No extra-axial fluid collection. Pituitary gland within normal limits. Vascular: Major intracranial vascular flow voids are maintained. Skull and upper cervical spine: Craniocervical junction normal. Bone marrow signal intensity within normal limits. No scalp soft tissue abnormality. Sinuses/Orbits: Globes and orbital soft tissues within normal limits. Patient status post ocular lens replacement bilaterally. Small maxillary sinus retention cysts noted paranasal sinuses are otherwise clear. No mastoid effusion. Inner ear structures normal. Other: None. MRA HEAD FINDINGS ANTERIOR CIRCULATION: Distal cervical segments of the internal carotid arteries are patent with antegrade flow. Petrous segments patent bilaterally. Scattered atheromatous irregularity within the cavernous/supraclinoid ICAs without hemodynamically significant stenosis. A1 segments patent bilaterally.  Left A1 hypoplastic, accounting for the diminutive left ICA is compared to the right. Normal anterior communicating artery. Anterior cerebral arteries patent to their distal aspects without stenosis M1 segments patent without stenosis. Normal MCA bifurcations. Distal MCA branches well perfused and symmetric POSTERIOR CIRCULATION: Vertebral arteries patent to the vertebrobasilar junction without stenosis. Patent right PICA. Left PICA not visualized. Dominant left AICA. Basilar artery widely patent to its distal aspect. basilar patent to its distal aspect without stenosis. Superior cerebral arteries patent bilaterally. Both of the posterior cerebral artery supplied via the basilar. Chronic occlusion of the proximal left PCA with minimal thready flow distally. Right PCA widely patent to its distal aspect without proximal high-grade or correctable stenosis. IMPRESSION: MRI HEAD IMPRESSION: 1. 8 mm acute ischemic nonhemorrhagic right temporal cortical infarct. 2. Interval evolution of large left PCA territory infarct, now late subacute to chronic in appearance. 3. Stable age-related cerebral atrophy with mild  chronic small vessel ischemic disease. MRA HEAD IMPRESSION: 1. Chronic proximal left P2 occlusion. 2. Otherwise negative intracranial MRA with relatively minor atherosclerotic change for age. No other proximal high-grade or correctable stenosis identified. Electronically Signed   By: Jeannine Boga M.D.   On: 04/16/2018 01:12    EKG:   Orders placed or performed during the hospital encounter of 04/15/18  . ED EKG within 10 minutes  . ED EKG within 10 minutes    ASSESSMENT AND PLAN:  84 year old patient with history of previous left PC infarct with the right side weakness comes in with worsening rights that weakness, start speech.  1. Acte right temporal lobe infarct;, previous left PC infarct likely embolic stroke; but patient already on Eliquis. Continue eliquis statins, monitor on telemetry,  seen  By cardiology Dr. Ubaldo Glassing, also seen by Dr. Delana Meyer from vascular because of his bilateral carotid stenosis.  2.chronic atrial fibrillation: patient  Had slight  bradycardia, metoprolol dose has been adjusted, continue Eliquis.Marland Kitchen  3.patient has bilateral carotid stenosis stenosis with more than 70% block, 80% at the stenosis involving proximal left subclavian: v patient is seen by Dr. Delana Meyer, angiogram on Friday.   more Than 50% time spent in counseling, coordination of care.  All the records are reviewed and case discussed with Care Management/Social Workerr. Management plans discussed with the patient, family and they are in agreement.  CODE STATUS: DNR  TOTAL TIME TAKING CARE OF THIS PATIENT: 16minutes.   POSSIBLE D/C IN 1-2DAYS, DEPENDING ON CLINICAL CONDITION.   Epifanio Lesches M.D on 04/17/2018 at 1:26 PM  Between 7am to 6pm - Pager - [phone]  After 6pm go to [email or web address] - password [email or web address] [facility]  Office  [phone]  CC: Primary care physician; Einar Pheasant, MD   Note: This dictation was prepared with Dragon dictation along with smaller phrase technology. Any transcriptional errors that result from this process are unintentional.

## 2018-04-17 NOTE — NC FL2 (Signed)
[place] LEVEL OF CARE SCREENING TOOL     IDENTIFICATION  Patient Name: Kyle Richardson Birthdate: October 23, 1927 Sex: male Admission Date (Current Location): 04/15/2018  [place] and [place] Number:  Engineering geologist and Address:  [facility], [address], [place],  [address]      Provider Number: [id number]  Attending Physician Name and Address:  Epifanio Lesches, MD  Relative Name and Phone Number:       Current Level of Care: Hospital Recommended Level of Care: [place] Prior Approval Number:    Date Approved/Denied:   PASRR Number: [id number])  Discharge Plan: SNF    Current Diagnoses: Patient Active Problem List   Diagnosis Date Noted  . TIA (transient ischemic attack) 04/15/2018  . Episode of confusion 04/08/2018  . Labile blood pressure   . Acute ischemic left PCA stroke ([place]) 02/27/2018  . Diabetes mellitus type 2 in nonobese (HCC)   . Glaucoma   . MG, ocular (myasthenia gravis) ([place])   . CVA (cerebral vascular accident) ([place]) 02/24/2018  . Right homonymous hemianopsia 02/23/2018  . Unresponsive episode 02/22/2018  . UTI (urinary tract infection) 01/28/2018  . Abnormal chest CT 09/20/2017  . Double vision 01/21/2017  . Ascending aortic aneurysm ([place]) 12/22/2016  . Blood-tinged sputum 10/23/2016  . Cough 10/23/2016  . PAF (paroxysmal atrial fibrillation) ([place]) 09/14/2016  . Health care maintenance 12/26/2014  . Stress 07/05/2014  . Migraine 05/10/2013  . Type 2 diabetes mellitus with complication, without long-term current use of insulin ([place]) 08/20/2012  . Hypertension 08/20/2012  . Hypercholesterolemia 08/20/2012  . CAD (coronary artery disease) 08/20/2012    Orientation RESPIRATION BLADDER Height & Weight     Self  Normal Continent Weight: 162 lb 4.8 oz (73.6 kg) Height:  5\' 8"  (172.7 cm)  BEHAVIORAL SYMPTOMS/MOOD NEUROLOGICAL BOWEL NUTRITION STATUS      Continent Diet(Diet:  Heart Healthy/ Carb Modifided. )  AMBULATORY STATUS COMMUNICATION OF NEEDS Skin   Extensive Assist Verbally Normal                       Personal Care Assistance Level of Assistance  Bathing, Feeding, Dressing Bathing Assistance: Limited assistance Feeding assistance: Independent Dressing Assistance: Limited assistance     Functional Limitations Info  Sight, Hearing, Speech Sight Info: Impaired Hearing Info: Impaired Speech Info: Adequate    SPECIAL CARE FACTORS FREQUENCY  PT (By licensed PT), OT (By licensed OT)     PT Frequency: (5) OT Frequency: (5)            Contractures      Additional Factors Info  Code Status, Allergies Code Status Info: (Full Code. ) Allergies Info: (Clarithromycin, Codeine, Flomax Tamsulosin Hcl, Lamisil, Terbinafine, Levofloxacin, Sertraline, Sulfa Antibiotics)           Current Medications (04/17/2018):  This is the current hospital active medication list Current Facility-Administered Medications  Medication Dose Route Frequency Provider Last Rate Last Dose  . acetaminophen (TYLENOL) tablet 650 mg  650 mg Oral Q4H PRN Vaughan Basta, MD       Or  . acetaminophen (TYLENOL) solution 650 mg  650 mg Per Tube Q4H PRN Vaughan Basta, MD       Or  . acetaminophen (TYLENOL) suppository 650 mg  650 mg Rectal Q4H PRN Vaughan Basta, MD      . amLODipine (NORVASC) tablet 5 mg  5 mg Oral Daily Vaughan Basta, MD   5 mg at 04/17/18 1030  .  apixaban (ELIQUIS) tablet 5 mg  5 mg Oral BID Vaughan Basta, MD   5 mg at 04/17/18 1030  . atorvastatin (LIPITOR) tablet 40 mg  40 mg Oral Daily Vaughan Basta, MD   40 mg at 04/17/18 1030  . divalproex (DEPAKOTE ER) 24 hr tablet 500 mg  500 mg Oral QHS Vaughan Basta, MD   500 mg at 04/16/18 2054  . FLUoxetine (PROZAC) capsule 10 mg  10 mg Oral Daily Vaughan Basta, MD   10 mg at 04/17/18 1031  . fluticasone (FLONASE) 50 MCG/ACT nasal spray 1  spray  1 spray Each Nare QHS Vaughan Basta, MD   1 spray at 04/16/18 2055  . insulin aspart (novoLOG) injection 0-9 Units  0-9 Units Subcutaneous TID WC Vaughan Basta, MD   1 Units at 04/17/18 1249  . latanoprost (XALATAN) 0.005 % ophthalmic solution 1 drop  1 drop Both Eyes QHS Vaughan Basta, MD   1 drop at 04/16/18 2055  . loratadine (CLARITIN) tablet 10 mg  10 mg Oral Daily Vaughan Basta, MD   10 mg at 04/17/18 1033  . metoprolol succinate (TOPROL-XL) 24 hr tablet 12.5 mg  12.5 mg Oral Daily Epifanio Lesches, MD   12.5 mg at 04/17/18 1031  . senna-docusate (Senokot-S) tablet 1 tablet  1 tablet Oral QHS PRN Vaughan Basta, MD         Discharge Medications: Please see discharge summary for a list of discharge medications.  Relevant Imaging Results:  Relevant Lab Results:   Additional Information (SSN: [id number])  Kyle Richardson, Kyle Beets, LCSW

## 2018-04-17 NOTE — Assessment & Plan Note (Signed)
Low carb diet.  Follow met b and a1c.  

## 2018-04-17 NOTE — Progress Notes (Signed)
Inpatient Rehabilitation  Note that CSW, nurse case manager, patient, and family have decided on returning home with 24/7 care giver assist.  Will sign off at this time.    Carmelia Roller., CCC/SLP Admission Coordinator  [place]  Cell [phone]

## 2018-04-17 NOTE — Assessment & Plan Note (Signed)
Recently admitted and found to have left PCA stroke.  Was in rehab. Discharged home with home health.  Insurance denied continued PT.  With new symptoms now, refer back to ER for further evaluation and treatment.

## 2018-04-17 NOTE — Clinical Social Work Note (Signed)
Clinical Social Work Assessment  Patient Details  Name: Kyle Richardson MRN: [id number] Date of Birth: [birth year]  Date of referral:  04/17/18               Reason for consult:  Facility Placement                Permission sought to share information with:    Permission granted to share information::     Name::        Agency::     Relationship::     Contact Information:     Housing/Transportation Living arrangements for the past 2 months:  Single Family Home Source of Information:  Adult Children, Spouse Patient Interpreter Needed:  None Criminal Activity/Legal Involvement Pertinent to Current Situation/Hospitalization:  No - Comment as needed Significant Relationships:  Adult Children, Spouse Lives with:  Spouse Do you feel safe going back to the place where you live?  Yes Need for family participation in patient care:  Yes (Comment)  Care giving concerns:  Patient lives in [place] with his [name].    Social Worker assessment / plan:  Holiday representative (Lee) reviewed chart and noted that PT is recommending CIR. CSW and RN case manager met with patient and his daughter Jackelyn Poling was at bedside. Patient did not participate in assessment. CSW introduced self and explained role of CSW department. Per daughter patient was recently at inpatient rehab at [facility]. Per daughter patient is now living at home with 24/7 caregivers. CSW and RN case manager explained to daughter that PT is recommending CIR however Health Team will likely deny CIR and SNF. Daughter verbalized her understanding and also reported that Health Team did not approve home health either. Per daughter plan is for patient to D/C back home from [facility] and continue with 24/7 caregivers. CSW contacted patient's [name] who confirmed that plan. RN case manager aware of above. CSW will continue to follow and assist as needed.   Employment status:  Retired Nurse, adult PT Recommendations:  Inpatient  [place] / Referral to community resources:  Other (Comment Required)(Patient will D/C home. )  Patient/Family's Response to care:  Patient's wife and daughter prefer for patient to D/C home.   Patient/Family's Understanding of and Emotional Response to Diagnosis, Current Treatment, and Prognosis:  Patient's wife and daughter were very pleasant and thanked CSW for visit.   Emotional Assessment Appearance:  Appears stated age Attitude/Demeanor/Rapport:  Unable to Assess Affect (typically observed):  Unable to Assess Orientation:  Oriented to Self, Fluctuating Orientation (Suspected and/or reported Sundowners) Alcohol / Substance use:  Not Applicable Psych involvement (Current and /or in the community):  No (Comment)  Discharge Needs  Concerns to be addressed:  Discharge Planning Concerns Readmission within the last 30 days:  No Current discharge risk:  Dependent with Mobility Barriers to Discharge:  Continued Medical Work up   [employer], Veronia Beets, LCSW 04/17/2018, 1:59 PM

## 2018-04-17 NOTE — Assessment & Plan Note (Signed)
Has been followed by vascular.  

## 2018-04-17 NOTE — Assessment & Plan Note (Signed)
On eliquis.  Followed by cardiology.   

## 2018-04-17 NOTE — Care Management Note (Signed)
Case Management Note  Patient Details  Name: Kyle Richardson MRN: [id number] Date of Birth: Aug 18, 1928  Subjective/Objective:                  Admitted to [facility] with the diagnosis of TIA under observation status, now changed to inpatient due to CVA. Lives with wife. Daughter is [name] [phone]). Discharged from this facility to Inpatient Acute Rehab at [facility] 04/01/18. Discharged from [facility] 04/01/18. Spoke with daughter at the bedside. [facility] for occupational and physical therapy after leaving [facility]. No other home health. No skilled facility. No home oxygen. Wheelchair,rolling walker, tub bench, and bedside commode in the home. Daughter has arranged 24/7 private caregivers in the home. Usually self feed. Caregivers help with baths and dressing.  No falls. Usually a good appetite.  Action/Plan: Daughter would like home health ([facility]) in the home again   Expected Discharge Date:                  Expected Discharge Plan:     In-House Referral:     Discharge planning Services     Post Acute Care Choice:    Choice offered to:     DME Arranged:    DME Agency:     HH Arranged:   yes HH Agency:   [facility]  Status of Service:     If discussed at [place] of Stay Meetings, dates discussed:    Additional Comments:  Shelbie Ammons, RN MSN CCM Care Management [phone] 04/17/2018, 10:14 AM

## 2018-04-17 NOTE — Assessment & Plan Note (Signed)
Blood pressure as outlined.  Same medication regimen.  Follow.

## 2018-04-17 NOTE — Care Management Obs Status (Signed)
[facility] NOTIFICATION   Patient Details  Name: Kyle Richardson MRN: [id number] Date of Birth: 1928/03/08   Medicare Observation Status Notification Given:  Yes    Shelbie Ammons, RN 04/17/2018, 9:43 AM

## 2018-04-17 NOTE — Progress Notes (Signed)
Physical Therapy Treatment Patient Details Name: Fate Caster MRN: [id number] DOB: 12-15-1927 Today's Date: 04/17/2018    History of Present Illness 84yo male presented to ER secondary to worsening weakness, difficulty with mobility; admitted for TIA/CVA work-up. MRI significant for 37mm infarct to R temporal lobe, subacute/chronic infarcts to L PCA, L BG and L thalamus.    PT Comments    Patient with noted progression in gait distance and overall activity tolerance.  Constant manual facilitation for R lateral trunk activation and L ant/lateral weight shift with all standing/gait tasks, but excellent response to facilitation with noted attempts to integrate spontaneously as able.  Improved R UE/LE control with visual attention to R hemi-body; ataxia, apraxia improved with emphasis on functional, automatic movement patterns with maximal demand (WBing, stabilization) on R hemi-body. Continues to demonstrate excellent potential for further functional recovery (increasing functional indep, decreasing caregiver burden) with access to high-quality, high-intensity post-acute rehab services.   Follow Up Recommendations  CIR     Equipment Recommendations       Recommendations for Other Services       Precautions / Restrictions Precautions Precautions: Fall Restrictions Weight Bearing Restrictions: No    Mobility  Bed Mobility               General bed mobility comments: seated in recliner beginning/end of treatment sessin  Transfers Overall transfer level: Needs assistance   Transfers: Sit to/from Stand Sit to Stand: Mod assist         General transfer comment: manual faciliation for R ant/lateral weight shift to attain/maintain midline wiht movement transition  Ambulation/Gait Ambulation/Gait assistance: Mod assist;+2 safety/equipment Gait Distance (Feet): (>50) Assistive device: None       General Gait Details: constant manual cuing for L ant/lateral weight shift  and overall midline positioning throughout gait cycle.  With appropriate weight shift, able to demonstrate reciprocal stepping pattern (though inconsistent R foot placement due to ataxia, sensory deficit).  Slower, deliberate cadence with limtied balance reactions.   Stairs Stairs: Yes Stairs assistance: Mod assist;Max assist Stair Management: One rail Left Number of Stairs: 4 General stair comments: reciprocal stepping pattern, fair/good activation of R LE with concentric loading; marked difficulty with midline in M/L, A/P plane during stair descent (increased fatigue, increased fear of activity evident), assist for R LE placement and mechanics   Wheelchair Mobility    Modified Rankin (Stroke Patients Only)       Balance                                            Cognition Arousal/Alertness: Awake/alert Behavior During Therapy: WFL for tasks assessed/performed Overall Cognitive Status: Impaired/Different from baseline                                 General Comments: alert and oriented to basic information; mild difficulty with sustained attention to conversation/task; improved awareness (and spontaneous acknowledgement) of R hemi-body and visual field      Exercises      General Comments        Pertinent Vitals/Pain Pain Assessment: No/denies pain    Home Living                      Prior Function  PT Goals (current goals can now be found in the care plan section) Acute Rehab PT Goals Patient Stated Goal: "feel better so I can eventually go home" PT Goal Formulation: With patient Time For Goal Achievement: 04/30/18 Potential to Achieve Goals: Good Progress towards PT goals: Progressing toward goals    Frequency    7X/week      PT Plan Current plan remains appropriate    Co-evaluation              AM-PAC PT "6 Clicks" Daily Activity  Outcome Measure  Difficulty turning over in bed  (including adjusting bedclothes, sheets and blankets)?: Unable Difficulty moving from lying on back to sitting on the side of the bed? : Unable Difficulty sitting down on and standing up from a chair with arms (e.g., wheelchair, bedside commode, etc,.)?: Unable Help needed moving to and from a bed to chair (including a wheelchair)?: A Lot Help needed walking in hospital room?: A Lot Help needed climbing 3-5 steps with a railing? : A Lot 6 Click Score: 9    End of Session Equipment Utilized During Treatment: Gait belt Activity Tolerance: Patient tolerated treatment well Patient left: in chair;with chair alarm set;with call bell/phone within reach;with family/visitor present Nurse Communication: Mobility status PT Visit Diagnosis: Unsteadiness on feet (R26.81);Muscle weakness (generalized) (M62.81);History of falling (Z91.81);Hemiplegia and hemiparesis Hemiplegia - Right/Left: Right Hemiplegia - dominant/non-dominant: Dominant Hemiplegia - caused by: Cerebral infarction     Time: 0931-1216 PT Time Calculation (min) (ACUTE ONLY): 38 min  Charges:  $Gait Training: 23-37 mins $Neuromuscular Re-education: 8-22 mins                    G Codes:      Ermel Verne H. Owens Shark, PT, DPT, NCS 04/17/18, 11:04 PM [phone]

## 2018-04-18 LAB — GLUCOSE, CAPILLARY
GLUCOSE-CAPILLARY: 126 mg/dL — AB (ref 70–99)
GLUCOSE-CAPILLARY: 144 mg/dL — AB (ref 70–99)
GLUCOSE-CAPILLARY: 168 mg/dL — AB (ref 70–99)
Glucose-Capillary: 133 mg/dL — ABNORMAL HIGH (ref 70–99)
Glucose-Capillary: 116 mg/dL — ABNORMAL HIGH (ref 70–99)

## 2018-04-18 LAB — IRON AND TIBC
Iron: 79 ug/dL (ref 45–182)
Saturation Ratios: 30 % (ref 17.9–39.5)
TIBC: 264 ug/dL (ref 250–450)
UIBC: 185 ug/dL

## 2018-04-18 MED ORDER — CEFAZOLIN SODIUM-DEXTROSE 2-4 GM/100ML-% IV SOLN: 2.0000 g | INTRAVENOUS | Status: DC

## 2018-04-18 MED ORDER — SODIUM CHLORIDE 0.9 % IV SOLN: INTRAVENOUS | Status: DC

## 2018-04-18 NOTE — Progress Notes (Signed)
Late entry.  Patient went to special procedures for Angiogram with was cancelled due to another emergency. Family at bedside and is aware patient will have the procedure tomorrow. pts consent in in chart.

## 2018-04-18 NOTE — Progress Notes (Signed)
[facility] [place] NAME: Jarrius Huaracha    MR#:  [id number]  DATE OF BIRTH:  November 02, 1927  SUBJECTIVE: no new complaints.went for rct angio  neckthis morning but procedure could not be done so patient came back.   CHIEF COMPLAINT:   Chief Complaint  Patient presents with  . Weakness   no new complaints today. Physical therapy recommended inpatient rehab, patient daughter wants to find out if the insurance pays she would rather have him go to inpatient rehab then home. REVIEW OF SYSTEMS:   ROS  patient  Has slurred speech but denies any complaints CONSTITUTIONAL: No fever, fatigue or weakness.  EYES: No blurred or double vision.  EARS, NOSE, AND THROAT: No tinnitus or ear pain.  RESPIRATORY: No cough, shortness of breath, wheezing or hemoptysis.  CARDIOVASCULAR: No chest pain, orthopnea, edema.  GASTROINTESTINAL: No nausea, vomiting, diarrhea or abdominal pain.  GENITOURINARY: No dysuria, hematuria.  ENDOCRINE: No polyuria, nocturia,  HEMATOLOGY: No anemia, easy bruising or bleeding SKIN: No rash or lesion. MUSCULOSKELETAL: No joint pain or arthritis.   NEUROLOGIC: tingling of right hand,, coordination problem with left hand  PSYCHIATRY: No anxiety or depression.   DRUG ALLERGIES:   Allergies  Allergen Reactions  . Clarithromycin Other (See Comments) and Diarrhea  . Codeine   . Flomax [Tamsulosin Hcl]   . Lamisil [Terbinafine]   . Levofloxacin Other (See Comments)    Diplopia. NEVER put patient on levoquin!  . Sertraline Other (See Comments)  . Sulfa Antibiotics     VITALS:  Blood pressure 130/68, pulse 60, temperature 97.6 F (36.4 C), temperature source Oral, resp. rate 20, height 5\' 8"  (1.727 m), weight 73.6 kg (162 lb 4.8 oz), SpO2 93 %.  PHYSICAL EXAMINATION:  GENERAL:  84 y.o.-year-old no distress, speech is clear today. EYES: Pupils equal, round, reactive to light and accommodation. No scleral icterus.  Extraocular muscles intact.  HEENT: Head atraumatic, normocephalic. Oropharynx and nasopharynx clear.  NECK:  Supple, no jugular venous distention. No thyroid enlargement, no tenderness.  LUNGS: Normal breath sounds bilaterally, no wheezing, rales,rhonchi or crepitation. No use of accessory muscles of respiration.  CARDIOVASCULAR: S1, S2 normal. No murmurs, rubs, or gallops.  ABDOMEN: Soft, nontender, nondistended. Bowel sounds present. No organomegaly or mass.  EXTREMITIES: No pedal edema, cyanosis, or clubbing.  NEUROLOGIC:  decreased hearing. Motor exam 5/5 upper, lower extremities on right and left. DTR t 2+ bilaterally., No obvious neurological deficit  PSYCHIATRIC: The patient is alert and oriented x 3. Slurred speech observed. SKIN: No obvious rash, lesion, or ulcer.    LABORATORY PANEL:   CBC Recent Labs  Lab 04/15/18 1550  WBC 7.7  HGB 15.7  HCT 45.3  PLT 192   ------------------------------------------------------------------------------------------------------------------  Chemistries  Recent Labs  Lab 04/15/18 1550  NA 140  K 4.7  CL 103  CO2 30  GLUCOSE 167*  BUN 13  CREATININE 0.93  CALCIUM 9.2  AST 26  ALT 30  ALKPHOS 72  BILITOT 0.4   ------------------------------------------------------------------------------------------------------------------  Cardiac Enzymes Recent Labs  Lab 04/15/18 1550  TROPONINI <0.03   ------------------------------------------------------------------------------------------------------------------  RADIOLOGY:  No results found.  EKG:   Orders placed or performed during the hospital encounter of 04/15/18  . ED EKG within 10 minutes  . ED EKG within 10 minutes    ASSESSMENT AND PLAN:  84 year old patient with history of previous left PC infarct with the right side weakness comes in with worsening rights that weakness, start  speech.  1. Acte right temporal lobe infarct;, previous left PC infarct likely embolic  stroke; but patient already on Eliquis. Continue eliquis ,statins, monitor on telemetry, seen  By cardiology Dr. Ubaldo Glassing, also seen by Dr. Delana Meyer from vascular because of his bilateral carotid stenosis. Patient scheduled for CT angiography is tomorrow. Continue to monitor on telemetry. Physical  therapy recommends inpatient rehab, patient's daughter agreeable for inpatient rehab insurance  Pay for it,e. If not she would take him home with home health PT.pt has good potential for inpt rehab,  2.chronic atrial fibrillation: rate controlled.continue metoprolol, Eliquis.  3.patient has bilateral carotid stenosis stenosis with more than 70% block, 80% at the stenosis involving proximal left subclavian:  patient is seen by Dr. Delana Meyer, angiogram on Friday.   more Than 50% time spent in counseling, coordination of care.  All the records are reviewed and case discussed with Care Management/Social Workerr. Management plans discussed with the patient, family and they are in agreement.  CODE STATUS: DNR  TOTAL TIME TAKING CARE OF THIS PATIENT: 16minutes.   POSSIBLE D/C IN 1-2DAYS, DEPENDING ON CLINICAL CONDITION.   Epifanio Lesches M.D on 04/18/2018 at 2:13 PM  Between 7am to 6pm - Pager - [phone]  After 6pm go to [email or web address] - password [email or web address] [facility]  Office  [phone]  CC: Primary care physician; Einar Pheasant, MD   Note: This dictation was prepared with Dragon dictation along with smaller phrase technology. Any transcriptional errors that result from this process are unintentional.

## 2018-04-18 NOTE — Progress Notes (Signed)
PT Cancellation Note  Patient Details Name: Indio Santilli MRN: [id number] DOB: [birth year]   Cancelled Treatment:    Reason Eval/Treat Not Completed: Patient at procedure or test/unavailable(Treatmet attempted but pt currently out of room for medical procedure. Will reattempt visit at later date when pt availiable)   Hortencia Conradi, SPT 04/18/18,10:52 AM

## 2018-04-18 NOTE — Progress Notes (Signed)
OT Cancellation Note  Patient Details Name: Alexxander Kurt MRN: [id number] DOB: 1928-05-26   Cancelled Treatment:    Reason Eval/Treat Not Completed: Patient at procedure or test/ unavailable. Pt out of the room for a procedure this am. Will re-attempt OT treatment at later date/time as pt is available and medically appropriate.   Jeni Salles, MPH, MS, OTR/L ascom [phone] 04/18/18, 10:43 AM

## 2018-04-18 NOTE — Progress Notes (Signed)
Physical Therapy Treatment Patient Details Name: Kyle Richardson MRN: [id number] DOB: [birth year] Today's Date: 04/18/2018    History of Present Illness 84yo male presented to ER secondary to worsening weakness, difficulty with mobility; admitted for TIA/CVA work-up. MRI significant for 24mm infarct to R temporal lobe, subacute/chronic infarcts to L PCA, L BG and L thalamus.    PT Comments    Patient continues to progress with all aspects of mobility and neuro re-education.  Completing all functional tasks with less physical assist and demonstrating more consistent, indep, awareness and integration of R hemi-body and R visual field with all functional activities.     Follow Up Recommendations  CIR     Equipment Recommendations       Recommendations for Other Services       Precautions / Restrictions Precautions Precautions: Fall Restrictions Weight Bearing Restrictions: No    Mobility  Bed Mobility Overal bed mobility: Needs Assistance Bed Mobility: Supine to Sit     Supine to sit: Min guard;Min assist     General bed mobility comments: cuing, guidance for R UE position; encouraged for reaching across body to initiate rotation and flexion of trunk  Transfers Overall transfer level: Needs assistance Equipment used: None Transfers: Sit to/from Stand Sit to Stand: Min assist         General transfer comment: intermittently utilizes posterior surface of bilat LEs against edge of bed to assist a with lift off, but improved ability to maintain midline in A/P and M/L planes  Ambulation/Gait Ambulation/Gait assistance: Mod assist Gait Distance (Feet): (>75)         General Gait Details: continued cuing for R lateral trunk activation and L ant/lateral weight shift.  Decreased cadence and dynamic balance reactions, but reciprocal stepping pattern with fair/good R LE step height/legnth and foot clearance with appropraite weight shift/midline   Stairs              Wheelchair Mobility    Modified Rankin (Stroke Patients Only)       Balance Overall balance assessment: Needs assistance Sitting-balance support: No upper extremity supported;Feet supported Sitting balance-Leahy Scale: Good     Standing balance support: No upper extremity supported Standing balance-Leahy Scale: Fair                              Cognition Arousal/Alertness: Awake/alert Behavior During Therapy: WFL for tasks assessed/performed                                   General Comments: improving awareness and attention to R hemi body      Exercises Other Exercises Other Exercises: Sit/stand x7 without UE support, emphasis on LE position, movement mechanics, awareness/position of R hemi-body and midline orientation in A/P, M/L planes.  Able to complete movement transition with less physical assist this date, self-correcting midline and maintaining static stance with close sup with each repetition. Other Exercises: Bilat lateral stepping activity (to facilitate funtional weight shift to L LE), min/mod assist for balance; progressing to include visual scanning (to R), object/letter recognition and R UE fine/gross motor to grasp items.  Excellent effort and participation with task; fair recall of task sequence between repetitions.  Mild difficulty with letter/number recognition, worsened with fatigue (mildly perseverative)    General Comments        Pertinent Vitals/Pain Pain Assessment: No/denies pain Pain Score: 0-No pain  Home Living                      Prior Function            PT Goals (current goals can now be found in the care plan section) Acute Rehab PT Goals Patient Stated Goal: "feel better so I can eventually go home" PT Goal Formulation: With patient Time For Goal Achievement: 04/30/18 Potential to Achieve Goals: Good Progress towards PT goals: Progressing toward goals    Frequency    7X/week       PT Plan Current plan remains appropriate    Co-evaluation              AM-PAC PT "6 Clicks" Daily Activity  Outcome Measure  Difficulty turning over in bed (including adjusting bedclothes, sheets and blankets)?: Unable Difficulty moving from lying on back to sitting on the side of the bed? : Unable Difficulty sitting down on and standing up from a chair with arms (e.g., wheelchair, bedside commode, etc,.)?: Unable Help needed moving to and from a bed to chair (including a wheelchair)?: A Little Help needed walking in hospital room?: A Lot Help needed climbing 3-5 steps with a railing? : A Lot 6 Click Score: 10    End of Session Equipment Utilized During Treatment: Gait belt Activity Tolerance: Patient tolerated treatment well Patient left: in chair;with chair alarm set;with call bell/phone within reach;with family/visitor present Nurse Communication: Mobility status PT Visit Diagnosis: Unsteadiness on feet (R26.81);Muscle weakness (generalized) (M62.81);History of falling (Z91.81);Hemiplegia and hemiparesis Hemiplegia - Right/Left: Right Hemiplegia - dominant/non-dominant: Dominant Hemiplegia - caused by: Cerebral infarction     Time: 1620-1703 PT Time Calculation (min) (ACUTE ONLY): 43 min  Charges:  $Gait Training: 8-22 mins $Neuromuscular Re-education: 23-37 mins                    G Codes:       Kyle Richardson, PT, DPT, NCS 04/18/18, 5:44 PM [phone]

## 2018-04-18 NOTE — Care Management (Signed)
Discussed Inpatient Acute Rehabilitation with Wife and Daughter of Mr. Catalfamo. Would like to explore this plan. Left voice mail for Gunnar Fusi Admission Coordinator for [facility].  Shelbie Ammons RN MSN CCM Care management [phone]

## 2018-04-18 NOTE — Progress Notes (Signed)
[facility] Cardiology  SUBJECTIVE: [name] is an 84 year old male with past medical history significant for CAD s/p PTCA, CABG, type 2 DM, HTN, HLD, paroxysmal atrial fibrillation, and sleep apnea who was recently discharged from [facility] on 5/22 following an ischemic left PCA stroke. On 04/15/18, patient was admitted for right-sided weakness and confusion. Today, patient is drowsy, in and out of sleep, but daughter denies patient complaining of any chest pain, shortness of breath, palpitations, or lower extremity edema.   Vascular was consulted on 7/9 and right internal carotid artery stenosis of 75-85% was noted. Surgery/stenting recommended following angiography.    Vitals:   04/17/18 1607 04/17/18 1950 04/18/18 0025 04/18/18 0344  BP: 124/72 131/69 (!) 129/58 118/60  Pulse: 75 67 (!) 57 61  Resp: 18 18  18   Temp: 97.7 F (36.5 C) 97.9 F (36.6 C)  (!) 97.5 F (36.4 C)  TempSrc: Oral Oral  Oral  SpO2: 95% 94% 93% 94%  Weight:      Height:         Intake/Output Summary (Last 24 hours) at 04/18/2018 0826 Last data filed at 04/18/2018 0400 Gross per 24 hour  Intake 600 ml  Output 575 ml  Net 25 ml      PHYSICAL EXAM  General: Well developed, well nourished, in no acute distress HEENT:  Normocephalic and atramatic Neck:  No JVD.  Lungs: Clear bilaterally to auscultation and percussion. Heart: HRRR . Normal S1 and S2 without gallops or murmurs.  Abdomen: Bowel sounds are positive, abdomen soft and non-tender  Msk:  Back normal. Normal strength and tone for age. Extremities: No clubbing, cyanosis or edema.   Neuro: Confused, in and out of sleep  Psych:  Confused, in and out of sleep    LABS: Basic Metabolic Panel: Recent Labs    04/15/18 1550  NA 140  K 4.7  CL 103  CO2 30  GLUCOSE 167*  BUN 13  CREATININE 0.93  CALCIUM 9.2   Liver Function Tests: Recent Labs    04/15/18 1550  AST 26  ALT 30  ALKPHOS 72  BILITOT 0.4  PROT 6.8  ALBUMIN 3.7   No results for  input(s): LIPASE, AMYLASE in the last 72 hours. CBC: Recent Labs    04/15/18 1550  WBC 7.7  HGB 15.7  HCT 45.3  MCV 94.5  PLT 192   Cardiac Enzymes: Recent Labs    04/15/18 1550  TROPONINI <0.03   BNP: Invalid input(s): POCBNP D-Dimer: No results for input(s): DDIMER in the last 72 hours. Hemoglobin A1C: Recent Labs    04/16/18 0558  HGBA1C 6.8*   Fasting Lipid Panel: Recent Labs    04/16/18 0558  CHOL 159  HDL 35*  LDLCALC 66  TRIG 288*  CHOLHDL 4.5   Thyroid Function Tests: No results for input(s): TSH, T4TOTAL, T3FREE, THYROIDAB in the last 72 hours.  Invalid input(s): FREET3 Anemia Panel: No results for input(s): VITAMINB12, FOLATE, FERRITIN, TIBC, IRON, RETICCTPCT in the last 72 hours.  No results found.   Echo: 5/19 showed normal EF of 60-65% with mild LVH    ASSESSMENT AND PLAN:  Principal Problem:   TIA (transient ischemic attack)    1. Paroxysmal Atrial Fibrillation              - Continue Eliquis 5mg  BID; make sure patient is compliant              - Continue metoprolol 12.5mg  daily  2. Acute right temporal  infarct             - Continue with Eliquis, Lipitor  3. Bilateral carotid stenosis              - Patient seen by Dr. Delana Meyer on 04/16/18              - Agree with recommendation of angiography and possible stent/surgery of right internal carotid    The history, physical exam findings, and plan of care were discussed with Dr. Bartholome Bill and all decisions were made in collaboration.    Avie Arenas PA-C 04/18/2018 8:26 AM

## 2018-04-18 NOTE — Progress Notes (Signed)
Inpatient Rehabilitation  Received call from case manager, Hassan Rowan that patient and family are wanting to pursue a request for IP Rehab authorization with [employer].  Will place rehab consult order and initiate insurance request.  Plan to follow for timing of medical readiness, insurance decision, and IP Rehab bed availability.  Call if questions.   Carmelia Roller., CCC/SLP Admission Coordinator  [place]  Cell [phone]

## 2018-04-19 ENCOUNTER — Encounter: Payer: Self-pay | Admitting: *Deleted

## 2018-04-19 ENCOUNTER — Encounter
Admission: EM | Disposition: A | Discharge status: Home Health | Payer: Self-pay | Source: Ambulatory Visit | Attending: Internal Medicine

## 2018-04-19 DIAGNOSIS — I6521 Occlusion and stenosis of right carotid artery: Secondary | ICD-10-CM | POA: Diagnosis not present

## 2018-04-19 HISTORY — PX: CAROTID ANGIOGRAPHY: CATH118230

## 2018-04-19 LAB — GLUCOSE, CAPILLARY
GLUCOSE-CAPILLARY: 136 mg/dL — AB (ref 70–99)
GLUCOSE-CAPILLARY: 107 mg/dL — AB (ref 70–99)
Glucose-Capillary: 144 mg/dL — ABNORMAL HIGH (ref 70–99)
Glucose-Capillary: 115 mg/dL — ABNORMAL HIGH (ref 70–99)
Glucose-Capillary: 145 mg/dL — ABNORMAL HIGH (ref 70–99)

## 2018-04-19 SURGERY — CAROTID ANGIOGRAPHY
Anesthesia: Moderate Sedation | Laterality: Bilateral

## 2018-04-19 MED ORDER — FENTANYL CITRATE (PF) 100 MCG/2ML IJ SOLN
INTRAMUSCULAR | Status: AC
  Filled 2018-04-19: qty 2

## 2018-04-19 MED ORDER — LIDOCAINE HCL (PF) 1 % IJ SOLN
INTRAMUSCULAR | Status: AC
  Filled 2018-04-19: qty 30

## 2018-04-19 MED ORDER — FENTANYL CITRATE (PF) 100 MCG/2ML IJ SOLN
INTRAMUSCULAR | Status: DC | PRN
  Administered 2018-04-19 (×2): 25 ug via INTRAVENOUS

## 2018-04-19 MED ORDER — ZOLPIDEM TARTRATE 5 MG PO TABS
5.0000 mg | ORAL_TABLET | Freq: Every evening | ORAL | Status: DC | PRN
  Administered 2018-04-21: 01:00:00 5 mg via ORAL
  Filled 2018-04-19 (×2): qty 1

## 2018-04-19 MED ORDER — SODIUM CHLORIDE 0.9 % IV SOLN
INTRAVENOUS | Status: DC
  Administered 2018-04-19 (×2): via INTRAVENOUS

## 2018-04-19 MED ORDER — HEPARIN SODIUM (PORCINE) 1000 UNIT/ML IJ SOLN
INTRAMUSCULAR | Status: AC
  Filled 2018-04-19: qty 1

## 2018-04-19 MED ORDER — IOPAMIDOL (ISOVUE-300) INJECTION 61%
INTRAVENOUS | Status: DC | PRN
  Administered 2018-04-19: 45 mL via INTRAVENOUS

## 2018-04-19 MED ORDER — HEPARIN (PORCINE) IN NACL 1000-0.9 UT/500ML-% IV SOLN
INTRAVENOUS | Status: AC
  Filled 2018-04-19: qty 1000

## 2018-04-19 MED ORDER — MIDAZOLAM HCL 5 MG/5ML IJ SOLN
INTRAMUSCULAR | Status: AC
  Filled 2018-04-19: qty 5

## 2018-04-19 SURGICAL SUPPLY — 10 items
CATH ANGIO 5F 100CM .035 PIG (CATHETERS) ×3 IMPLANT
CATH VERT 5FR 125CM (CATHETERS) ×3 IMPLANT
DEVICE STARCLOSE SE CLOSURE (Vascular Products) ×3 IMPLANT
NEEDLE ENTRY 21GA 7CM ECHOTIP (NEEDLE) ×3 IMPLANT
PACK ANGIOGRAPHY (CUSTOM PROCEDURE TRAY) ×3 IMPLANT
SET INTRO CAPELLA COAXIAL (SET/KITS/TRAYS/PACK) ×3 IMPLANT
SHEATH BRITE TIP 5FRX11 (SHEATH) ×3 IMPLANT
TUBING CONTRAST HIGH PRESS 72 (TUBING) ×3 IMPLANT
WIRE AQUATRACK .035X260CM (WIRE) ×3 IMPLANT
WIRE J 3MM .035X145CM (WIRE) ×3 IMPLANT

## 2018-04-19 NOTE — Progress Notes (Addendum)
Clinical Social Worker (CSW) contacted patient's wife Kyle Richardson to discuss D/C plan. Per wife her first choice would be for patient to go to inpatient rehab at [facility] and she is hoping [employer] will approve it. Wife is agreeable to look into SNF as a back up if [employer] denies inpatient rehab. FL2 complete and faxed out. CSW will continue to follow and assist as needed.   CSW contacted wife and presented SNF bed offers. Wife requested [facility]. [facility] has not responded to referral yet. CSW will continue to follow and assist as needed.   [employer], LCSW [phone]

## 2018-04-19 NOTE — Progress Notes (Signed)
Inpatient Rehabilitation  Called and spoke with patient's spouse to discuss their request for IP Rehab.  She stated that they are familiar with our rehab program and were pleased with his progress following his previous CVA and would like for him to return for post acute rehab following this CVA as well.  Shared I have initiated insurance authorization and note that he has ongoing medical work up at this time.  Plan to follow on Monday, 04/22/18.  Call if questions.    Carmelia Roller., CCC/SLP Admission Coordinator  [place]  Cell [phone]

## 2018-04-19 NOTE — Progress Notes (Signed)
OT Cancellation Note  Patient Details Name: Kyle Richardson MRN: [id number] DOB: Apr 16, 1928   Cancelled Treatment:    Reason Eval/Treat Not Completed: Patient at procedure or test/ unavailable. Upon attempt, pt out of room for specials procedure.  Will re-attempt at later time/date as medically appropriate.  Jeni Salles, MPH, MS, OTR/L ascom [phone] 04/19/18, 4:42 PM

## 2018-04-19 NOTE — Progress Notes (Signed)
[place] [facility] [place] NAME: Kyle Richardson    MR#:  [id number]  DATE OF BIRTH:  [birth year]  SUBJECTIVE: no new complaints. CT angiogram procedure to be done this evening. Patient is NPO, getting IV fluids. Daughter mentioned that patient could not sleep.   CHIEF COMPLAINT:   Chief Complaint  Patient presents with  . Weakness   no new complaints today. Physical therapy recommended inpatient rehab, patient daughter wants to find out if the insurance pays she would rather have him go to inpatient rehab than home. Patient 's daughter wants to meet with OT people today.  No neurological issues reported by patients daughter. REVIEW OF SYSTEMS:   ROS  patient is resting at this time.  CONSTITUTIONAL: No fever, fatigue or weakness.  EYES: No blurred or double vision.  EARS, NOSE, AND THROAT: No tinnitus or ear pain.  RESPIRATORY: No cough, shortness of breath, wheezing or hemoptysis.  CARDIOVASCULAR: No chest pain, orthopnea, edema.  GASTROINTESTINAL: No nausea, vomiting, diarrhea or abdominal pain.  GENITOURINARY: No dysuria, hematuria.  ENDOCRINE: No polyuria, nocturia,  HEMATOLOGY: No anemia, easy bruising or bleeding SKIN: No rash or lesion. MUSCULOSKELETAL: No joint pain or arthritis.   NEUROLOGIC: tingling of right hand,, coordination problem with left hand  PSYCHIATRY: No anxiety or depression.   DRUG ALLERGIES:   Allergies  Allergen Reactions  . Clarithromycin Other (See Comments) and Diarrhea  . Codeine   . Flomax [Tamsulosin Hcl]   . Lamisil [Terbinafine]   . Levofloxacin Other (See Comments)    Diplopia. NEVER put patient on levoquin!  . Sertraline Other (See Comments)  . Sulfa Antibiotics     VITALS:  Blood pressure (!) 125/51, pulse 71, temperature 97.6 F (36.4 C), temperature source Oral, resp. rate 18, height 5\' 8"  (1.727 m), weight 73.5 kg (162 lb), SpO2 95 %.  PHYSICAL EXAMINATION:  GENERAL:  84  y.o.-year-old no distress, speech is clear today. EYES: Pupils equal, round, reactive to light and accommodation. No scleral icterus. Extraocular muscles intact.  HEENT: Head atraumatic, normocephalic. Oropharynx and nasopharynx clear.  NECK:  Supple, no jugular venous distention. No thyroid enlargement, no tenderness.  LUNGS: Normal breath sounds bilaterally, no wheezing, rales,rhonchi or crepitation. No use of accessory muscles of respiration.  CARDIOVASCULAR: S1, S2 normal. No murmurs, rubs, or gallops.  ABDOMEN: Soft, nontender, nondistended. Bowel sounds present. No organomegaly or mass.  EXTREMITIES: No pedal edema, cyanosis, or clubbing.  NEUROLOGIC:  decreased hearing. Motor exam 5/5 upper, lower extremities on right and left. DTR t 2+ bilaterally., No obvious neurological deficit  PSYCHIATRIC: The patient is alert and oriented x 3. Slurred speech observed. SKIN: No obvious rash, lesion, or ulcer.    LABORATORY PANEL:   CBC Recent Labs  Lab 04/15/18 1550  WBC 7.7  HGB 15.7  HCT 45.3  PLT 192   ------------------------------------------------------------------------------------------------------------------  Chemistries  Recent Labs  Lab 04/15/18 1550  NA 140  K 4.7  CL 103  CO2 30  GLUCOSE 167*  BUN 13  CREATININE 0.93  CALCIUM 9.2  AST 26  ALT 30  ALKPHOS 72  BILITOT 0.4   ------------------------------------------------------------------------------------------------------------------  Cardiac Enzymes Recent Labs  Lab 04/15/18 1550  TROPONINI <0.03   ------------------------------------------------------------------------------------------------------------------  RADIOLOGY:  No results found.  EKG:   Orders placed or performed during the hospital encounter of 04/15/18  . ED EKG within 10 minutes  . ED EKG within 10 minutes    ASSESSMENT AND PLAN:  84 year old patient  with history of previous left PC infarct with the right side weakness comes  in with worsening rights that weakness, start speech.  1. Acte right temporal lobe infarct;, previous left PC infarct likely embolic stroke; but patient already on Eliquis. Continue eliquis ,statins, monitor on telemetry, seen  By cardiology Dr. Ubaldo Glassing, also seen by Dr. Delana Meyer from vascular because of his bilateral carotid stenosis. Patient scheduled for CT angiography today  evening, monitor kidney function closely, continue IV fluids as patient is NPO. When the patient starts to eat discontinue IV fluids.. Continue to monitor on telemetry.  Physical  therapy recommends inpatient rehab, patient's daughter agreeable for inpatient rehab insurance  Pay for it,. If not she would take him home with home health PT.pt has good potential for inpt rehab, waiting for insurance authorization.  2.chronic atrial fibrillation: rate controlled.continue metoprolol, Eliquis.  3.patient has bilateral carotid stenosis stenosis with more than 70% block, 80% at the stenosis involving proximal left subclavian:  patient is seen by Dr. Delana Meyer, angiogram today.pt went this am but procedure postponed till evening due to emergency procedure 5.chrnic restless ness at night;use Ambien at night,iron  levels are within normal limits   more Than 50% time spent in counseling, coordination of care.  All the records are reviewed and case discussed with Care Management/Social Workerr. Management plans discussed with the patient, family and they are in agreement.  CODE STATUS: DNR  TOTAL TIME TAKING CARE OF THIS PATIENT: 28minutes.   POSSIBLE D/C IN 1-2DAYS, DEPENDING ON CLINICAL CONDITION.   Epifanio Lesches M.D on 04/19/2018 at 2:25 PM  Between 7am to 6pm - Pager - [phone]  After 6pm go to [email or web address] - password [email or web address] [facility]  Office  [phone]  CC: Primary care physician; Einar Pheasant, MD   Note: This dictation was prepared with Dragon dictation along with smaller phrase  technology. Any transcriptional errors that result from this process are unintentional.

## 2018-04-19 NOTE — Op Note (Signed)
[facility]   OPERATIVE NOTE  DATE: 04/19/2018  PRE-OPERATIVE DIAGNOSIS: 1.  Right common and internal carotid artery stenosis, tandem lesions by CT angiogram   POST-OPERATIVE DIAGNOSIS: Same as above  PROCEDURE: 1.   Ultrasound Guidance for vascular access right femoral artery 2.   Catheter placement into right common carotid artery from right femoral approach 3.   Thoracic aortogram 4.   Cervical and cerebral right carotid angiograms 5.   StarClose closure device right femoral artery  SURGEON: Hortencia Pilar, MD  ASSISTANT(S): None  ANESTHESIA: Moderate conscious sedation  ESTIMATED BLOOD LOSS: Less than 15 cc  FLUORO TIME: 2.5  CONTRAST: 45  MODERATE CONSCIOUS SEDATION TIME: Approximately 38 minutes using Fentanyl only  FINDING(S): 1.  There is an 80% stenosis in the mid to proximal right common carotid artery.  Given how low this lesion extends surgical exposure would be quite challenging.  The stenosis at the origin of the internal carotid artery is 90%  SPECIMEN(S):  None  INDICATIONS:   Patient is a 84 y.o.male who presents with acute right temporal stroke. The patient's CT angiogram is indeterminate and does not characterize the tandem lesions well. Catheter-based angiogram is performed for further evaluation to determine whether surgery or stenting would be the more optimal therapy. Risks and benefits are discussed and informed consent was obtained.  DESCRIPTION: After obtaining full informed written consent, the patient was brought back to the operating room and placed supine upon the vascular suite table.  After obtaining adequate anesthesia, the patient was prepped and draped in the standard fashion.  Moderate conscious sedation was administered during a face to face encounter with the patient throughout the procedure with my supervision of the RN administering medicines and monitoring the patients vital signs and mental status throughout  from the start of the procedure until the patient was taken to the recovery room. The right femoral artery was visualized with ultrasound and found to be calcific but patent. It was then accessed under direct ultrasound guidance without difficulty with a Seldinger needle. A J-wire and 5 French sheath were placed and a permanent image was recorded. The patient was given 3000 units of intravenous heparin. A pigtail catheter was placed into the ascending aorta and an LAO projection thoracic aortogram was performed. This showed type I arch with widely patent great vessels.  I then selected a vertebral catheter and cannulated the innominate artery advancing into the mid right common carotid artery. Cervical and cerebral carotid angiography were then performed on the right side initially. The intracranial flow was found to be fairly robust with visualization of the middle and anterior cerebral arteries and good venous filling. The cervical carotid artery was noted to have tandem lesions.  The cervical carotid was imaged at 4 different angles, AP, lateral, and both LAO and RAO.  The bifurcation lesion at the origin of the internal is best seen on the LAO projection and is a 90% moderate to severely calcified lesion.  The common carotid lesion is noted to be in the mid to proximal right common carotid it represents an 80% lesion.  It does extend to below the level of the clavicle making surgical exposure challenging and possibly requiring partial sternotomy.  Given these findings I believe stenting will be the more suitable option for treatment of these tandem lesions.  At this point, we had imaging to plan our treatment and we elected to terminate the procedure. The diagnostic catheter was removed. Oblique arteriogram was performed of  the right femoral artery and StarClose closure device was deployed in usual fashion with excellent hemostatic result. The patient tolerated the procedure well and was taken to the  recovery room in stable condition.  COMPLICATIONS: None  CONDITION: Stable   Hortencia Pilar 04/19/2018 5:38 PM   This note was created with Dragon Medical transcription system. Any errors in dictation are purely unintentional.

## 2018-04-19 NOTE — Progress Notes (Signed)
[facility] Cardiology  SUBJECTIVE: [name] is an 84 year old male with past medical history significant for CAD s/p PTCA, CABG, type 2 DM, HTN, HLD, paroxysmal atrial fibrillation, and sleep apnea who was recently discharged from [facility] on 5/22 following an ischemic left PCA stroke. On 04/15/18, patient was admitted for right-sided weakness and confusion. Today, patient is drowsy, in and out of sleep, but daughter denies patient complaining of any chest pain, shortness of breath, palpitations, or lower extremity edema.   Vascular was consulted on 7/9 and right internal carotid artery stenosis of 75-85% was noted. Surgery/stenting recommended following angiography.    Vitals:   04/18/18 1055 04/18/18 1215 04/18/18 2113 04/19/18 0548  BP: 120/71 130/68 111/64 113/62  Pulse: (!) 59 60 83 75  Resp: 14 20 18 18   Temp:  97.6 F (36.4 C) 98.6 F (37 C) 98.4 F (36.9 C)  TempSrc:  Oral Oral Oral  SpO2: 93% 93% 97% 95%  Weight:      Height:         Intake/Output Summary (Last 24 hours) at 04/19/2018 0825 Last data filed at 04/18/2018 1918 Gross per 24 hour  Intake 600 ml  Output -  Net 600 ml      PHYSICAL EXAM  General: Well developed, well nourished, in no acute distress HEENT:  Normocephalic and atramatic Neck:  No JVD.  Lungs: Clear bilaterally to auscultation and percussion. Heart: HRRR . Normal S1 and S2 without gallops or murmurs.  Abdomen: Bowel sounds are positive, abdomen soft and non-tender  Msk:  Back normal. Normal strength and tone for age. Extremities: No clubbing, cyanosis or edema.   Neuro: Confused, in and out of sleep Psych:  Confused, in and out of sleep    LABS: Basic Metabolic Panel: No results for input(s): NA, K, CL, CO2, GLUCOSE, BUN, CREATININE, CALCIUM, MG, PHOS in the last 72 hours. Liver Function Tests: No results for input(s): AST, ALT, ALKPHOS, BILITOT, PROT, ALBUMIN in the last 72 hours. No results for input(s): LIPASE, AMYLASE in the last 72  hours. CBC: No results for input(s): WBC, NEUTROABS, HGB, HCT, MCV, PLT in the last 72 hours. Cardiac Enzymes: No results for input(s): CKTOTAL, CKMB, CKMBINDEX, TROPONINI in the last 72 hours. BNP: Invalid input(s): POCBNP D-Dimer: No results for input(s): DDIMER in the last 72 hours. Hemoglobin A1C: No results for input(s): HGBA1C in the last 72 hours. Fasting Lipid Panel: No results for input(s): CHOL, HDL, LDLCALC, TRIG, CHOLHDL, LDLDIRECT in the last 72 hours. Thyroid Function Tests: No results for input(s): TSH, T4TOTAL, T3FREE, THYROIDAB in the last 72 hours.  Invalid input(s): FREET3 Anemia Panel: Recent Labs    04/18/18 1731  TIBC 264  IRON 79    No results found.   Echo: 5/19 showed normal EF of 60-65% with mild LVH.    ASSESSMENT AND PLAN:  Principal Problem:   TIA (transient ischemic attack)    1. Paroxysmal Atrial Fibrillation  - Continue Eliquis 5mg  BID; make sure patient is compliant  - Continue metoprolol 12.5mg  daily  2. Acute right temporal infarct - Continue with Eliquis, Lipitor  3. Bilateral carotid stenosis  - Patient seen by Dr. Delana Richardson on 04/16/18  - Agree with recommendation of angiography and possible stent/surgery of right internal carotid    The history, physical exam findings, and plan of care were discussed with Dr. Bartholome Richardson and all decisions were made in collaboration.   Signing off on the patient's care for now. Please call if any  questions arise. Follow up outpatient cardiology.   Kyle Arenas, PA-C 04/19/2018 8:25 AM

## 2018-04-19 NOTE — Progress Notes (Signed)
PT Cancellation Note  Patient Details Name: Yitzhak Awan MRN: [id number] DOB: [birth year]   Cancelled Treatment:    Reason Eval/Treat Not Completed: Patient at procedure or test/unavailable(Treatment session attempted. Patient currently off unit for specials procedure.  Will re-attempt at later time/date as medically appropriate.)   Camy Leder H. Owens Shark, PT, DPT, NCS 04/19/18, 2:53 PM [phone]

## 2018-04-20 ENCOUNTER — Other Ambulatory Visit: Payer: Self-pay

## 2018-04-20 ENCOUNTER — Inpatient Hospital Stay: Payer: PPO

## 2018-04-20 LAB — GLUCOSE, CAPILLARY
GLUCOSE-CAPILLARY: 100 mg/dL — AB (ref 70–99)
GLUCOSE-CAPILLARY: 128 mg/dL — AB (ref 70–99)
GLUCOSE-CAPILLARY: 133 mg/dL — AB (ref 70–99)
Glucose-Capillary: 145 mg/dL — ABNORMAL HIGH (ref 70–99)

## 2018-04-20 MED ORDER — LORAZEPAM 2 MG/ML IJ SOLN
0.5000 mg | Freq: Three times a day (TID) | INTRAMUSCULAR | Status: DC | PRN
Start: 2018-04-20 — End: 2018-04-22

## 2018-04-20 NOTE — Progress Notes (Signed)
Occupational Therapy Treatment Patient Details Name: Kyle Richardson MRN: [id number] DOB: 20-Sep-1928 Today's Date: 04/20/2018    History of present illness 84yo male presented to ER secondary to worsening weakness, difficulty with mobility; admitted for TIA/CVA work-up. MRI significant for 36mm infarct to R temporal lobe, subacute/chronic infarcts to L PCA, L BG and L thalamus.   OT comments  Pt. Requires extensive hand-over hand assist for engaging the RUE during light ADL tasks. Required max cues, and hand over hand assist to use his right hand for combing his hair. Pt. worked on engaging his right hand during tasks in midline stabilizing and opening/closing bottles. Pt. Worked On shaving with his right hand with minA using his Left hand. Pt. required assist, and cues for the right side of his face. Pt. Worked on goal directed reaching, and ROM. Pt. could benefit from OT services for ADL training, neuromuscular re-ed, and pt. Education about home modification, and DME. Pt. Continues to benefit from follow-up OT services upon discharge.   Follow Up Recommendations  CIR    Equipment Recommendations  Other (comment)    Recommendations for Other Services Rehab consult    Precautions / Restrictions Precautions Precautions: Fall Precaution Comments: R hemiparesis, ataxia Restrictions Weight Bearing Restrictions: No                                                     ADL either performed or assessed with clinical judgement   ADL Overall ADL's : Needs assistance/impaired Eating/Feeding: Sitting;Set up;Minimal assistance   Grooming: Sitting;Minimal assistance;Min guard;Brushing hair;Wash/dry face Grooming Details (indicate cue type and reason): Pt. worked on combing hair using his RUE with hand-over-hand assist. Pt. required set-up and minA for shaving with the LUE, cues and assist for thoroughness, and for the right side of the face.             Lower Body  Dressing: Sit to/from stand;Maximal assistance;Min guard                       Vision Baseline Vision/History: Wears glasses Wears Glasses: At all times Patient Visual Report: Other (comment) Vision Assessment?: Yes   Perception     Praxis      Cognition Arousal/Alertness: Awake/alert Behavior During Therapy: WFL for tasks assessed/performed Overall Cognitive Status: Impaired/Different from baseline Area of Impairment: Problem solving;Following commands                       Following Commands: Follows one step commands with increased time     Problem Solving: Slow processing;Requires tactile cues;Requires verbal cues;Difficulty sequencing;Decreased initiation          Exercises     Shoulder Instructions       General Comments      Pertinent Vitals/ Pain       Pain Assessment: No/denies pain  Home Living                                          Prior Functioning/Environment              Frequency  Min 3X/week        Progress Toward Goals  OT Goals(current goals can now be found in  the care plan section)        Plan Discharge plan remains appropriate    Co-evaluation                 AM-PAC PT "6 Clicks" Daily Activity     Outcome Measure   Help from another person eating meals?: A Little Help from another person taking care of personal grooming?: A Little Help from another person toileting, which includes using toliet, bedpan, or urinal?: A Lot Help from another person bathing (including washing, rinsing, drying)?: A Lot Help from another person to put on and taking off regular upper body clothing?: A Lot Help from another person to put on and taking off regular lower body clothing?: A Lot 6 Click Score: 14    End of Session    OT Visit Diagnosis: Other abnormalities of gait and mobility (R26.89);Hemiplegia and hemiparesis;Ataxia, unspecified (R27.0);Apraxia (R48.2);Low vision, both eyes  (H54.2) Hemiplegia - Right/Left: Right Hemiplegia - dominant/non-dominant: Dominant Hemiplegia - caused by: Cerebral infarction   Activity Tolerance Patient tolerated treatment well   Patient Left in chair;with call bell/phone within reach;with family/visitor present;with chair alarm set   Nurse Communication          Time:  - 11:50-12:24 & 12:55-13:10    Charges: OT Treatments $Self Care/Home Management : 38-52 mins  Harrel Carina, MS, OTR/L   Harrel Carina 04/20/2018, 2:19 PM

## 2018-04-20 NOTE — Progress Notes (Signed)
[facility] [place] NAME: Servando Kyllonen    MR#:  [id number]  DATE OF BIRTH:  12-21-1927  SUBJECTIVE: Patient seen and evaluated today CTA neck done yesterday Tolerating diet ok No chest pain  Received physical therapy and occupational therapy  REVIEW OF SYSTEMS:   ROS  patient is resting at this time.  CONSTITUTIONAL: No fever, fatigue or weakness.  EYES: No blurred or double vision.  EARS, NOSE, AND THROAT: No tinnitus or ear pain.  RESPIRATORY: No cough, shortness of breath, wheezing or hemoptysis.  CARDIOVASCULAR: No chest pain, orthopnea, edema.  GASTROINTESTINAL: No nausea, vomiting, diarrhea or abdominal pain.  GENITOURINARY: No dysuria, hematuria.  ENDOCRINE: No polyuria, nocturia,  HEMATOLOGY: No anemia, easy bruising or bleeding SKIN: No rash or lesion. MUSCULOSKELETAL: No joint pain or arthritis.   NEUROLOGIC: tingling of right hand,, coordination problem with left hand PSYCHIATRY: No anxiety or depression.   DRUG ALLERGIES:   Allergies  Allergen Reactions  . Clarithromycin Other (See Comments) and Diarrhea  . Codeine   . Flomax [Tamsulosin Hcl]   . Lamisil [Terbinafine]   . Levofloxacin Other (See Comments)    Diplopia. NEVER put patient on levoquin!  . Sertraline Other (See Comments)  . Sulfa Antibiotics     VITALS:  Blood pressure 130/69, pulse 68, temperature 98 F (36.7 C), temperature source Oral, resp. rate 18, height 5\' 8"  (1.727 m), weight 73.5 kg (162 lb), SpO2 93 %.  PHYSICAL EXAMINATION:  GENERAL:  84 y.o.-year-old no distress, speech is good EYES: Pupils equal, round, reactive to light and accommodation. No scleral icterus. Extraocular muscles intact.  HEENT: Head atraumatic, normocephalic. Oropharynx and nasopharynx clear.  NECK:  Supple, no jugular venous distention. No thyroid enlargement, no tenderness.  LUNGS: Normal breath sounds bilaterally, no wheezing, rales,rhonchi or  crepitation. No use of accessory muscles of respiration.  CARDIOVASCULAR: S1, S2 normal. No murmurs, rubs, or gallops.  ABDOMEN: Soft, nontender, nondistended. Bowel sounds present. No organomegaly or mass.  EXTREMITIES: No pedal edema, cyanosis, or clubbing.  NEUROLOGIC:  decreased hearing. Motor exam 5/5 upper, lower extremities on right and left. DTR t 2+ bilaterally., No obvious neurological deficit  PSYCHIATRIC: The patient is alert and oriented x 3. SKIN: No obvious rash, lesion, or ulcer.    LABORATORY PANEL:   CBC Recent Labs  Lab 04/15/18 1550  WBC 7.7  HGB 15.7  HCT 45.3  PLT 192   ------------------------------------------------------------------------------------------------------------------  Chemistries  Recent Labs  Lab 04/15/18 1550  NA 140  K 4.7  CL 103  CO2 30  GLUCOSE 167*  BUN 13  CREATININE 0.93  CALCIUM 9.2  AST 26  ALT 30  ALKPHOS 72  BILITOT 0.4   ------------------------------------------------------------------------------------------------------------------  Cardiac Enzymes Recent Labs  Lab 04/15/18 1550  TROPONINI <0.03   ------------------------------------------------------------------------------------------------------------------  RADIOLOGY:  No results found.  EKG:   Orders placed or performed during the hospital encounter of 04/15/18  . ED EKG within 10 minutes  . ED EKG within 10 minutes    ASSESSMENT AND PLAN:  84 year old patient with history of previous left PC infarct with the right side weakness comes in with worsening rights that weakness, start speech.  1. Acte right temporal lobe infarct;, previous left PC infarct likely embolic stroke; but patient already on Eliquis. Continue eliquis ,statins, monitor on telemetry, seen  By cardiology Dr. Ubaldo Glassing, also seen by Dr. Delana Meyer from vascular because of his bilateral carotid stenosis.  2.Physical  therapy recommends inpatient rehab,  patient's daughter agreeable for  inpatient rehab insurance  Pay for it,. If not she would take him home with home health PT.pt has good potential for inpt rehab, waiting for insurance authorization.  3.chronic atrial fibrillation: rate controlled.continue metoprolol, Eliquis.  4. Bilateral carotid stenosis stenosis with more than 70% block, 80% at the stenosis involving proximal left subclavian:  patient is seen by Dr. Delana Meyer, angiogram today. CTA neck reveals bilateral carotid stenosis  5.chronic restless ness at night;use Ambien at night,iron  levels are within normal limits  6. Social worker f/u for placement  7. Ascending aortic dilatation F/u with periodic imaging  8.PT and OT f/u  More than 50% time spent in counseling, coordination of care.  All the records are reviewed and case discussed with Care Management/Social Workerr. Management plans discussed with the patient, family and they are in agreement.  CODE STATUS: DNR  TOTAL TIME TAKING CARE OF THIS PATIENT: 76minutes.   POSSIBLE D/C IN 1-2DAYS, DEPENDING ON CLINICAL CONDITION.   Saundra Shelling M.D on 04/20/2018 at 12:40 PM  Between 7am to 6pm - Pager - [phone]  After 6pm go to [email or web address] - password [email or web address] [facility]  Office  [phone]  CC: Primary care physician; Einar Pheasant, MD   Note: This dictation was prepared with Dragon dictation along with smaller phrase technology. Any transcriptional errors that result from this process are unintentional.

## 2018-04-20 NOTE — Progress Notes (Signed)
Physical Therapy Treatment Patient Details Name: Kyle Richardson MRN: [id number] DOB: 07-25-1928 Today's Date: 04/20/2018    History of Present Illness 84yo male presented to ER secondary to worsening weakness, difficulty with mobility; admitted for TIA/CVA work-up. MRI significant for 39mm infarct to R temporal lobe, subacute/chronic infarcts to L PCA, L BG and L thalamus.    PT Comments    Pt in bed, ready for session.  Min a x 1 to EOB with assist to bring trunk fully upright.  Verbal cues needed for hand placement and sequencing.  He was able to sit unsupported x 5 minutes for MD visit.  No LOB noted in static sitting.  He was able to stand with min a x 2 and ambulate around nursing unit x 1 with min/mod a +2.  Pt with right lateral lean during gait with ataxic movements of RLE.  He is highly motivated to increase mobility.  He was encouraged to take a standing rest break due to fatigue.  As fatigue increased gait quality and right lean increased.  Improved with short standing rest break. Daughter in for session.  Participated in exercises as described below.    Pt remains an excellent candidate for CIR.  He is motivated to improve and gave max effort during session.  While he was able to increase his gait distance today he continues to need skilled interventions for assist, safety and cues.    Pt may benefit from a right platform walker for gait trials.  RUE remains with poor motor and decreased grip with would be difficulty for pt to grasp walker handle on R.    Follow Up Recommendations  CIR     Equipment Recommendations       Recommendations for Other Services       Precautions / Restrictions Precautions Precautions: Fall Precaution Comments: R hemiparesis, ataxia Restrictions Weight Bearing Restrictions: No    Mobility  Bed Mobility Overal bed mobility: Needs Assistance Bed Mobility: Supine to Sit     Supine to sit: Min assist        Transfers Overall transfer  level: Needs assistance Equipment used: None Transfers: Sit to/from Stand Sit to Stand: Min assist;+2 physical assistance            Ambulation/Gait Ambulation/Gait assistance: Mod assist;Min assist;+2 physical assistance Gait Distance (Feet): 160 Feet Assistive device: 2 person hand held assist Gait Pattern/deviations: Step-through pattern;Decreased stance time - right;Staggering right;Ataxic Gait velocity: decreased       Stairs             Wheelchair Mobility    Modified Rankin (Stroke Patients Only)       Balance Overall balance assessment: Needs assistance Sitting-balance support: No upper extremity supported;Feet supported Sitting balance-Leahy Scale: Fair Sitting balance - Comments: verbal cues to remain upright during static sitting,  Postural control: Right lateral lean Standing balance support: Single extremity supported Standing balance-Leahy Scale: Poor Standing balance comment: Static standing balance with min a x 1 but with dynamic activities requires min-mod a +2 to prevent falls.                            Cognition Arousal/Alertness: Awake/alert Behavior During Therapy: WFL for tasks assessed/performed Overall Cognitive Status: Impaired/Different from baseline Area of Impairment: Problem solving;Following commands                       Following Commands: Follows one step commands with  increased time     Problem Solving: Slow processing;Requires tactile cues;Requires verbal cues;Difficulty sequencing;Decreased initiation        Exercises Other Exercises Other Exercises: seated AROM 2 x 10 with emphasis on full ROM and movement control for LAQ, marches, pillow squeeze  and ankle pumps.    General Comments        Pertinent Vitals/Pain Pain Assessment: No/denies pain    Home Living                      Prior Function            PT Goals (current goals can now be found in the care plan section)  Progress towards PT goals: Progressing toward goals    Frequency    7X/week      PT Plan      Co-evaluation              AM-PAC PT "6 Clicks" Daily Activity  Outcome Measure  Difficulty turning over in bed (including adjusting bedclothes, sheets and blankets)?: Unable Difficulty moving from lying on back to sitting on the side of the bed? : Unable Difficulty sitting down on and standing up from a chair with arms (e.g., wheelchair, bedside commode, etc,.)?: Unable Help needed moving to and from a bed to chair (including a wheelchair)?: A Little Help needed walking in hospital room?: A Lot Help needed climbing 3-5 steps with a railing? : A Lot 6 Click Score: 10    End of Session Equipment Utilized During Treatment: Gait belt Activity Tolerance: Patient tolerated treatment well Patient left: in chair;with chair alarm set;with call bell/phone within reach;with family/visitor present   Hemiplegia - Right/Left: Right Hemiplegia - dominant/non-dominant: Dominant Hemiplegia - caused by: Cerebral infarction     Time: 6301-6010 PT Time Calculation (min) (ACUTE ONLY): 32 min  Charges:  $Gait Training: 8-22 mins $Therapeutic Exercise: 8-22 mins                    G Codes:       Chesley Noon, PTA 04/20/18, 10:22 AM

## 2018-04-20 NOTE — Progress Notes (Signed)
Pt very confused and fidgety upon entering the room. Alert and oriented to person only. Pt is continually rambling about different things that do not pertain to questions he was being asked No other changes in neuro or NIH. On call MD made aware. Dr. Darvin Neighbours ordering repeat CT of head, and PRN Ativan. Will continue to monitor.

## 2018-04-20 NOTE — Clinical Social Work Note (Signed)
[employer] has contacted this CSW to advise the they are denying CIR. The CSW has withdrawn the request and entered a new request for SNF placement. The CSW will update as the authorization is determined.  Santiago Bumpers, MSW, Latanya Presser [phone]

## 2018-04-21 LAB — GLUCOSE, CAPILLARY
GLUCOSE-CAPILLARY: 104 mg/dL — AB (ref 70–99)
GLUCOSE-CAPILLARY: 169 mg/dL — AB (ref 70–99)
Glucose-Capillary: 132 mg/dL — ABNORMAL HIGH (ref 70–99)
Glucose-Capillary: 149 mg/dL — ABNORMAL HIGH (ref 70–99)

## 2018-04-21 MED ORDER — DIAZEPAM 2 MG PO TABS
2.0000 mg | ORAL_TABLET | Freq: Every evening | ORAL | Status: DC | PRN
  Administered 2018-04-21: 22:00:00 2 mg via ORAL
  Filled 2018-04-21: qty 1

## 2018-04-21 MED ORDER — DIAZEPAM 2 MG PO TABS
2.0000 mg | ORAL_TABLET | Freq: Once | ORAL | Status: AC
  Administered 2018-04-21: 2 mg via ORAL
  Filled 2018-04-21: qty 1

## 2018-04-21 NOTE — Progress Notes (Addendum)
Physical Therapy Treatment Patient Details Name: Kyle Richardson MRN: [id number] DOB: [birth year] Today's Date: 04/21/2018    History of Present Illness Pt is an 84 yo male presented to ER secondary to worsening weakness, difficulty with mobility; admitted for TIA/CVA work-up. MRI significant for 84mm infarct to R temporal lobe, subacute/chronic infarcts to L PCA, L BG and L thalamus.    PT Comments    Therapist received reports from nursing staff that pt was "fidgety" last night and received medications to help him sleep earlier this morning.  Pt also noted with increased confusion last night (Head CT performed 7/13 with no acute findings).  Pt awake upon PT entering room.  CGA supine to sit; min to mod assist x2 with transfers; and min to mod assist x2 to ambulate 40 feet with B hand hold assist (close chair follow for safety).  Pt noted with difficulty advancing R LE; cueing required to decrease R LE external rotation and also for wider BOS when advancing R LE (with fatigue pt starting to cross feet); intermittent assist to block R knee to prevent buckling; increased R lateral lean requiring assist to maintain midline positioning during ambulation.  Deferred trialing platform RW this session (pt requiring closer assist for safety).  Nursing tech reporting pt ambulated much better yesterday (less difficulty with advancing R LE/less gait impairments with R LE); nursing notified (question if pt has more difficulty during this session d/t reported medications given to pt earlier this morning to allow him to sleep).  Will continue to progress pt with strengthening, balance, and progressive functional mobility.   Follow Up Recommendations  CIR     Equipment Recommendations       Recommendations for Other Services       Precautions / Restrictions Precautions Precautions: Fall Precaution Comments: R hemiparesis, ataxia Restrictions Weight Bearing Restrictions: No    Mobility  Bed  Mobility Overal bed mobility: Needs Assistance Bed Mobility: Supine to Sit     Supine to sit: Min guard;HOB elevated     General bed mobility comments: CGA for safety supine to sit with vc's for technique required  Transfers Overall transfer level: Needs assistance Equipment used: 2 person hand held assist Transfers: Sit to/from [facility] Sit to Stand: Min assist;Mod assist;+2 physical assistance Stand pivot transfers: Min assist;Mod assist;+2 physical assistance       General transfer comment: assist for balance and to initiate stand from bed and from recliner; R knee buckling and difficulty obtaining R knee extension when standing requiring increased support on R side; cueing for UE and LE placement required  Ambulation/Gait Ambulation/Gait assistance: Min assist;Mod assist;+2 physical assistance Gait Distance (Feet): 40 Feet Assistive device: 2 person hand held assist   Gait velocity: decreased   General Gait Details: pt with difficulty advancing R LE; cueing required to decrease R LE external rotation and also for wider BOS when advancing R LE (with fatigue pt starting to cross feet); intermittent assist to block R knee to prevent buckling; increased R lateral lean requiring assist to maintain midline positioning during ambulation   Stairs             Wheelchair Mobility    Modified Rankin (Stroke Patients Only)       Balance Overall balance assessment: Needs assistance Sitting-balance support: No upper extremity supported;Feet supported Sitting balance-Leahy Scale: Fair Sitting balance - Comments: steady static sitting with UE support   Standing balance support: Bilateral upper extremity supported Standing balance-Leahy Scale: Poor Standing balance comment:  B hand hold assist (2 assist to maintain upright)                            Cognition   Behavior During Therapy: [facility] for tasks assessed/performed Overall Cognitive  Status: Impaired/Different from baseline Area of Impairment: Problem solving;Following commands(Oriented to person, DOB; increased time and cueing for month and president; did not know place)                       Following Commands: Follows one step commands with increased time     Problem Solving: Slow processing;Requires tactile cues;Requires verbal cues;Difficulty sequencing;Decreased initiation General Comments: Intermittent cueing for awareness/attention to R side of body      Exercises      General Comments   Nursing cleared pt for participation in physical therapy.  Pt agreeable to PT session.      Pertinent Vitals/Pain Pain Assessment: No/denies pain Pain Intervention(s): Limited activity within patient's tolerance;Monitored during session;Repositioned  HR stable and WFL throughout treatment session.    Home Living                      Prior Function            PT Goals (current goals can now be found in the care plan section) Acute Rehab PT Goals Patient Stated Goal: To get better PT Goal Formulation: With patient Time For Goal Achievement: 04/30/18 Potential to Achieve Goals: Fair Progress towards PT goals: Progressing toward goals(progressing with strengthening)    Frequency    7X/week      PT Plan Current plan remains appropriate    Co-evaluation              AM-PAC PT "6 Clicks" Daily Activity  Outcome Measure  Difficulty turning over in bed (including adjusting bedclothes, sheets and blankets)?: Unable Difficulty moving from lying on back to sitting on the side of the bed? : Unable Difficulty sitting down on and standing up from a chair with arms (e.g., wheelchair, bedside commode, etc,.)?: Unable Help needed moving to and from a bed to chair (including a wheelchair)?: Total Help needed walking in hospital room?: Total Help needed climbing 3-5 steps with a railing? : Total 6 Click Score: 6    End of Session Equipment  Utilized During Treatment: Gait belt Activity Tolerance: Patient tolerated treatment well Patient left: with call bell/phone within reach;in chair;with chair alarm set;with family/visitor present Nurse Communication: Mobility status;Precautions PT Visit Diagnosis: Unsteadiness on feet (R26.81);Muscle weakness (generalized) (M62.81);History of falling (Z91.81);Hemiplegia and hemiparesis Hemiplegia - Right/Left: Right Hemiplegia - dominant/non-dominant: Dominant Hemiplegia - caused by: Cerebral infarction     Time: 1212-1240 PT Time Calculation (min) (ACUTE ONLY): 28 min  Charges:  $Gait Training: 8-22 mins $Therapeutic Activity: 8-22 mins                    G CodesLeitha Bleak, PT 04/21/18, 1:15 PM [phone]

## 2018-04-21 NOTE — Progress Notes (Signed)
[facility] [place] NAME: Kyle Richardson    MR#:  [id number]  DATE OF BIRTH:  12-Sep-1928  SUBJECTIVE: Patient seen and evaluated today And was agitated last night He was given Valium to calm down CT head was repeated again last night but no acute abnormality CTA neck done yesterday Tolerating diet ok No chest pain  Received physical therapy and occupational therapy  REVIEW OF SYSTEMS:   ROS  patient is resting at this time.  CONSTITUTIONAL: No fever, fatigue or weakness.  EYES: No blurred or double vision.  EARS, NOSE, AND THROAT: No tinnitus or ear pain.  RESPIRATORY: No cough, shortness of breath, wheezing or hemoptysis.  CARDIOVASCULAR: No chest pain, orthopnea, edema.  GASTROINTESTINAL: No nausea, vomiting, diarrhea or abdominal pain.  GENITOURINARY: No dysuria, hematuria.  ENDOCRINE: No polyuria, nocturia,  HEMATOLOGY: No anemia, easy bruising or bleeding SKIN: No rash or lesion. MUSCULOSKELETAL: No joint pain or arthritis.   NEUROLOGIC: tingling of right hand,, coordination problem with left hand .Has periods of agitation PSYCHIATRY: No anxiety or depression.   DRUG ALLERGIES:   Allergies  Allergen Reactions  . Clarithromycin Other (See Comments) and Diarrhea  . Codeine   . Flomax [Tamsulosin Hcl]   . Lamisil [Terbinafine]   . Levofloxacin Other (See Comments)    Diplopia. NEVER put patient on levoquin!  . Sertraline Other (See Comments)  . Sulfa Antibiotics     VITALS:  Blood pressure (!) 172/65, pulse 64, temperature 98 F (36.7 C), resp. rate 16, height 5\' 8"  (1.727 m), weight 73.5 kg (162 lb), SpO2 96 %.  PHYSICAL EXAMINATION:  GENERAL:  84 y.o.-year-old no distress, speech is good EYES: Pupils equal, round, reactive to light and accommodation. No scleral icterus. Extraocular muscles intact.  HEENT: Head atraumatic, normocephalic. Oropharynx and nasopharynx clear.  NECK:  Supple, no jugular venous  distention. No thyroid enlargement, no tenderness.  LUNGS: Normal breath sounds bilaterally, no wheezing, rales,rhonchi or crepitation. No use of accessory muscles of respiration.  CARDIOVASCULAR: S1, S2 normal. No murmurs, rubs, or gallops.  ABDOMEN: Soft, nontender, nondistended. Bowel sounds present. No organomegaly or mass.  EXTREMITIES: No pedal edema, cyanosis, or clubbing.  NEUROLOGIC:  decreased hearing. Motor exam 5/5 upper, lower extremities on right and left. DTR t 2+ bilaterally., No obvious neurological deficit  PSYCHIATRIC: The patient is alert and oriented x 3. SKIN: No obvious rash, lesion, or ulcer.    LABORATORY PANEL:   CBC Recent Labs  Lab 04/15/18 1550  WBC 7.7  HGB 15.7  HCT 45.3  PLT 192   ------------------------------------------------------------------------------------------------------------------  Chemistries  Recent Labs  Lab 04/15/18 1550  NA 140  K 4.7  CL 103  CO2 30  GLUCOSE 167*  BUN 13  CREATININE 0.93  CALCIUM 9.2  AST 26  ALT 30  ALKPHOS 72  BILITOT 0.4   ------------------------------------------------------------------------------------------------------------------  Cardiac Enzymes Recent Labs  Lab 04/15/18 1550  TROPONINI <0.03   ------------------------------------------------------------------------------------------------------------------  RADIOLOGY:  Ct Head Wo Contrast  Result Date: 04/20/2018 CLINICAL DATA:  Confusion. Right arm weakness. Recent infarctions right temporal lobe and left occipital lobe. EXAM: CT HEAD WITHOUT CONTRAST TECHNIQUE: Contiguous axial images were obtained from the base of the skull through the vertex without intravenous contrast. COMPARISON:  04/15/2018 FINDINGS: Brain: No focal brainstem or cerebellar finding. Cerebral hemispheres show generalized atrophy. Late subacute to chronic infarction in the left PCA territory affecting the posteromedial temporal lobe and occipital lobe. Chronic  small-vessel ischemic changes elsewhere  within the hemispheric white matter. No sign of acute infarction, mass lesion, hemorrhage, hydrocephalus or extra-axial collection. Vascular: There is atherosclerotic calcification of the major vessels at the base of the brain. Skull: Negative Sinuses/Orbits: Clear/normal Other: None IMPRESSION: No acute finding by CT. Late subacute to chronic left PCA territory infarction. Atrophy and chronic small-vessel ischemic changes elsewhere. Electronically Signed   By: Nelson Chimes M.D.   On: 04/20/2018 20:14    EKG:   Orders placed or performed during the hospital encounter of 04/15/18  . ED EKG within 10 minutes  . ED EKG within 10 minutes    ASSESSMENT AND PLAN:  84 year old patient with history of previous left PC infarct with the right side weakness comes in with worsening rights that weakness, start speech.  1. Acte right temporal lobe infarct;, previous left PC infarct likely embolic stroke; but patient already on Eliquis. Continue eliquis ,statins, monitor on telemetry, seen  By cardiology Dr. Ubaldo Glassing, also seen by Dr. Delana Meyer from vascular because of his bilateral carotid stenosis.  2.Physical  therapy recommends inpatient rehab, patient's daughter agreeable for inpatient rehab insurance  Awaiting insurance authorization  3.chronic atrial fibrillation: rate controlled.continue metoprolol, Eliquis.  4. Bilateral carotid stenosis stenosis with more than 70% block, 80% at the stenosis involving proximal left subclavian:  patient is seen by Dr. Delana Meyer, angiogram today. CTA neck reveals bilateral carotid stenosis  5.chronic restless ness at night;use Ambien at night,iron  levels are within normal limits  6. Social worker f/u for placement  7. Ascending aortic dilatation F/u with periodic imaging  8.PT and OT f/u  More than 50% time spent in counseling, coordination of care.  All the records are reviewed and case discussed with Care Management/Social  Workerr. Management plans discussed with the patient, family and they are in agreement.  CODE STATUS: DNR  TOTAL TIME TAKING CARE OF THIS PATIENT: 25 minutes.   POSSIBLE D/C IN 1-2DAYS, DEPENDING ON CLINICAL CONDITION.   Saundra Shelling M.D on 04/21/2018 at 11:55 AM  Between 7am to 6pm - Pager - [phone]  After 6pm go to [email or web address] - password [email or web address] [facility]  Office  [phone]  CC: Primary care physician; Einar Pheasant, MD   Note: This dictation was prepared with Dragon dictation along with smaller phrase technology. Any transcriptional errors that result from this process are unintentional.

## 2018-04-22 ENCOUNTER — Encounter: Payer: Self-pay | Admitting: Vascular Surgery

## 2018-04-22 ENCOUNTER — Other Ambulatory Visit: Payer: Self-pay | Admitting: *Deleted

## 2018-04-22 LAB — GLUCOSE, CAPILLARY
GLUCOSE-CAPILLARY: 114 mg/dL — AB (ref 70–99)
GLUCOSE-CAPILLARY: 155 mg/dL — AB (ref 70–99)

## 2018-04-22 MED ORDER — METOPROLOL SUCCINATE ER 25 MG PO TB24: 12.5000 mg | ORAL_TABLET | Freq: Every day | ORAL | 1 refills | Status: DC

## 2018-04-22 NOTE — Progress Notes (Signed)
Physical Therapy Treatment Patient Details Name: Kyle Richardson MRN: [id number] DOB: [birth year] Today's Date: 04/22/2018    History of Present Illness Pt is an 84 yo male presented to ER secondary to worsening weakness, difficulty with mobility; admitted for TIA/CVA work-up. MRI significant for 32mm infarct to R temporal lobe, subacute/chronic infarcts to L PCA, L BG and L thalamus.    PT Comments    Pt continues to show progression with mobility activities. Pt moved to EOB today without verbal cueing or assistance beyond CGA. Pt is able to perform STS activity ( x 5) with min physical assist, and with verbal/tactile cueing for RLE placement. Pt is able to ambulate with min/mod assist 2+ 180 ft, primarily demonstrating difficulty with weight shift to left, R LE coordination and proprioception, and maintaining balance when gait is interrupted. Pt's cognition continues to improve, showing ability to follow multi-step commmands with intermittent reinforcement, and is able to identify letters and numbers mostly accurately. Pt also showed improved [place] in R hand, effectively manipulating TV remote. In light of CIR not being an option for the pt, and given the pt's need for assistance with ambulation/ADL's, as well as the pt's positive reaction to therapy, it is recommended the pt be d/c to SNF for further medical care and PT.   Follow Up Recommendations  SNF     Equipment Recommendations  (TBD at SNF)    Recommendations for Other Services       Precautions / Restrictions Precautions Precautions: Fall Precaution Comments: R hemiparesis, ataxia Restrictions Weight Bearing Restrictions: No    Mobility  Bed Mobility Overal bed mobility: Needs Assistance Bed Mobility: Supine to Sit;Sit to Supine     Supine to sit: Supervision;HOB elevated Sit to supine: Min guard   General bed mobility comments: + time and effort to perform  Transfers Overall transfer level: Needs assistance Equipment  used: Rolling walker (2 wheeled) Transfers: Sit to/from Stand Sit to Stand: Min guard;+2 safety/equipment Stand pivot transfers: Min assist;+2 physical assistance       General transfer comment: cues for RLE/foot placement to improve sit to satnd  Ambulation/Gait Ambulation/Gait assistance: Min assist;Mod assist;+2 physical assistance Gait Distance (Feet): 180 Feet Assistive device: 2 person hand held assist Gait Pattern/deviations: Decreased step length - right;Decreased step length - left;Decreased weight shift to left;Scissoring;Drifts right/left Gait velocity: decreased   General Gait Details: pt with difficulty advancing R LE; cueing required to decrease R LE adduction and also for wider BOS when advancing R LE; intermittent need for assist to help with weight shift L with RLE step; increased R lateral lean requiring assist to maintain midline positioning during ambulation   Stairs             Wheelchair Mobility    Modified Rankin (Stroke Patients Only)       Balance Overall balance assessment: Needs assistance Sitting-balance support: No upper extremity supported;Feet supported Sitting balance-Leahy Scale: Fair     Standing balance support: No upper extremity supported(Pt can maintain balance for periods of time without UE support, however occasionally needs min assist from therapist for rebalancing) Standing balance-Leahy Scale: Fair Standing balance comment: Pt can maintain balance for periods of time without UE support, however occasionally needs min assist from therapist for rebalancing             High level balance activites: Side stepping High Level Balance Comments: pt is able to side step with min assist 2+ in both direactions, movong to R more difficult than moving  to L            Cognition Arousal/Alertness: Awake/alert Behavior During Therapy: WFL for tasks assessed/performed Overall Cognitive Status: Impaired/Different from baseline Area  of Impairment: Problem solving;Following commands;Attention                   Current Attention Level: Sustained   Following Commands: Follows multi-step commands with increased time;Follows one step commands consistently     Problem Solving: Slow processing;Requires tactile cues;Requires verbal cues;Difficulty sequencing;Decreased initiation General Comments: Pt needs occasional cueing to attend to R side of body, and to perform actvitites requring R sided motor control.       Exercises Other Exercises Other Exercises: Gait Training 180 ft with min assist 2+, Sit to stand min assist x 5 from low bed position Other Exercises: Bilat lateral and for/backwards stepping activities (to facilitate functional weight shift to L LE), min/mod assist for balance; progressing to include visual scanning (to R), object/letter recognition and R UE fine/gross motor to grasp items. Mild difficulty with letter/number recognition.    General Comments        Pertinent Vitals/Pain Pain Assessment: No/denies pain Pain Intervention(s): Monitored during session;Limited activity within patient's tolerance;Repositioned    Home Living                      Prior Function            PT Goals (current goals can now be found in the care plan section) Acute Rehab PT Goals Patient Stated Goal: To get better Time For Goal Achievement: 04/30/18 Potential to Achieve Goals: Good Progress towards PT goals: Progressing toward goals    Frequency    7X/week      PT Plan Current plan remains appropriate    Co-evaluation              AM-PAC PT "6 Clicks" Daily Activity  Outcome Measure  Difficulty turning over in bed (including adjusting bedclothes, sheets and blankets)?: Unable Difficulty moving from lying on back to sitting on the side of the bed? : Unable Difficulty sitting down on and standing up from a chair with arms (e.g., wheelchair, bedside commode, etc,.)?: Unable Help  needed moving to and from a bed to chair (including a wheelchair)?: A Little Help needed walking in hospital room?: A Lot Help needed climbing 3-5 steps with a railing? : A Lot 6 Click Score: 10    End of Session Equipment Utilized During Treatment: Gait belt Activity Tolerance: Patient tolerated treatment well Patient left: with call bell/phone within reach;in chair;with chair alarm set   PT Visit Diagnosis: Unsteadiness on feet (R26.81);Muscle weakness (generalized) (M62.81);History of falling (Z91.81);Hemiplegia and hemiparesis Hemiplegia - Right/Left: Right Hemiplegia - dominant/non-dominant: Dominant Hemiplegia - caused by: Cerebral infarction     Time: 1135-1205 PT Time Calculation (min) (ACUTE ONLY): 30 min  Charges:                       G Codes:       Hortencia Conradi, SPT 05-16-2018,1:55 PM

## 2018-04-22 NOTE — Patient Outreach (Signed)
[facility]) Care Management  04/22/2018  Rondy Krupinski 1928-04-16 [id number]   Telephone Screen  Referral Date: 04/12/18 Referral Source: [facility] Referral Referral Reason:Medial director feels a social worker visit would be benificial to the member but the agency did not feel they wanted to pursue that.  Medial director states "If on an additional, new request, you would benefit from a CSW visit for planning for options of care going forward' Insurance: HTA   [facility] RN reviewed the referral, and the Epic notes to confirm Mr Bollman was admitted to [facility] on 04/15/18 and as of 04/22/18 remains at [facility] with possible attempts to get him to acute rehab prior to discharge  Plan: [place] will close case at this time as patient has been hospitalized [facility] RN CM sent update hospital liaison to follow up with patient and refer again as appropriate  Kimberly L. Lavina Hamman, RN, BSN, CCM [facility] Telephonic Care Management Care Coordinator Direct number [phone]  [facility] number [phone] Fax number [phone]

## 2018-04-22 NOTE — Progress Notes (Signed)
Patient discharged home with home health per MD order. Prescription given to patient. All discharge instructions given and all questions answered.

## 2018-04-22 NOTE — Progress Notes (Signed)
[place] [facility] [place] NAME: Kyle Richardson    MR#:  [id number]  DATE OF BIRTH:  04/02/1928  SUBJECTIVE: Patient seen and evaluated today No new overnight events Tolerating diet ok No chest pain  Received physical therapy and occupational therapy  REVIEW OF SYSTEMS:   ROS  patient is resting at this time.  CONSTITUTIONAL: No fever, fatigue or weakness.  EYES: No blurred or double vision.  EARS, NOSE, AND THROAT: No tinnitus or ear pain.  RESPIRATORY: No cough, shortness of breath, wheezing or hemoptysis.  CARDIOVASCULAR: No chest pain, orthopnea, edema.  GASTROINTESTINAL: No nausea, vomiting, diarrhea or abdominal pain.  GENITOURINARY: No dysuria, hematuria.  ENDOCRINE: No polyuria, nocturia,  HEMATOLOGY: No anemia, easy bruising or bleeding SKIN: No rash or lesion. MUSCULOSKELETAL: No joint pain or arthritis.   NEUROLOGIC: tingling of right hand,, coordination problem with left hand .Has periods of agitation PSYCHIATRY: No anxiety or depression.   DRUG ALLERGIES:   Allergies  Allergen Reactions  . Clarithromycin Other (See Comments) and Diarrhea  . Codeine   . Flomax [Tamsulosin Hcl]   . Lamisil [Terbinafine]   . Levofloxacin Other (See Comments)    Diplopia. NEVER put patient on levoquin!  . Sertraline Other (See Comments)  . Sulfa Antibiotics     VITALS:  Blood pressure 108/66, pulse 72, temperature 97.9 F (36.6 C), temperature source Oral, resp. rate 18, height 5\' 8"  (1.727 m), weight 73.5 kg (162 lb), SpO2 96 %.  PHYSICAL EXAMINATION:  GENERAL:  84 y.o.-year-old no distress, speech is good EYES: Pupils equal, round, reactive to light and accommodation. No scleral icterus. Extraocular muscles intact.  HEENT: Head atraumatic, normocephalic. Oropharynx and nasopharynx clear.  NECK:  Supple, no jugular venous distention. No thyroid enlargement, no tenderness.  LUNGS: Normal breath sounds bilaterally, no wheezing,  rales,rhonchi or crepitation. No use of accessory muscles of respiration.  CARDIOVASCULAR: S1, S2 normal. No murmurs, rubs, or gallops.  ABDOMEN: Soft, nontender, nondistended. Bowel sounds present. No organomegaly or mass.  EXTREMITIES: No pedal edema, cyanosis, or clubbing.  NEUROLOGIC:  decreased hearing. Motor exam 5/5 upper, lower extremities on right and left. DTR t 2+ bilaterally., No obvious neurological deficit  PSYCHIATRIC: The patient is alert and oriented x 3. SKIN: No obvious rash, lesion, or ulcer.    LABORATORY PANEL:   CBC Recent Labs  Lab 04/15/18 1550  WBC 7.7  HGB 15.7  HCT 45.3  PLT 192   ------------------------------------------------------------------------------------------------------------------  Chemistries  Recent Labs  Lab 04/15/18 1550  NA 140  K 4.7  CL 103  CO2 30  GLUCOSE 167*  BUN 13  CREATININE 0.93  CALCIUM 9.2  AST 26  ALT 30  ALKPHOS 72  BILITOT 0.4   ------------------------------------------------------------------------------------------------------------------  Cardiac Enzymes Recent Labs  Lab 04/15/18 1550  TROPONINI <0.03   ------------------------------------------------------------------------------------------------------------------  RADIOLOGY:  Ct Head Wo Contrast  Result Date: 04/20/2018 CLINICAL DATA:  Confusion. Right arm weakness. Recent infarctions right temporal lobe and left occipital lobe. EXAM: CT HEAD WITHOUT CONTRAST TECHNIQUE: Contiguous axial images were obtained from the base of the skull through the vertex without intravenous contrast. COMPARISON:  04/15/2018 FINDINGS: Brain: No focal brainstem or cerebellar finding. Cerebral hemispheres show generalized atrophy. Late subacute to chronic infarction in the left PCA territory affecting the posteromedial temporal lobe and occipital lobe. Chronic small-vessel ischemic changes elsewhere within the hemispheric white matter. No sign of acute infarction, mass  lesion, hemorrhage, hydrocephalus or extra-axial collection. Vascular: There is atherosclerotic  calcification of the major vessels at the base of the brain. Skull: Negative Sinuses/Orbits: Clear/normal Other: None IMPRESSION: No acute finding by CT. Late subacute to chronic left PCA territory infarction. Atrophy and chronic small-vessel ischemic changes elsewhere. Electronically Signed   By: Nelson Chimes M.D.   On: 04/20/2018 20:14    EKG:   Orders placed or performed during the hospital encounter of 04/15/18  . ED EKG within 10 minutes  . ED EKG within 10 minutes    ASSESSMENT AND PLAN:  84 year old patient with history of previous left PC infarct with the right side weakness comes in with worsening rights that weakness, start speech.  1. Acte right temporal lobe infarct;, previous left PC infarct likely embolic stroke; but patient already on Eliquis. Continue eliquis ,statins, monitor on telemetry, seen  By cardiology Dr. Ubaldo Glassing, also seen by Dr. Delana Meyer from vascular because of his bilateral carotid stenosis.  2.Physical  therapy recommends inpatient rehab, patient's daughter agreeable for inpatient rehab insurance  Awaiting insurance authorization  3.chronic atrial fibrillation: rate controlled.continue metoprolol, Eliquis.  4. Bilateral carotid stenosis stenosis with more than 70% block, 80% at the stenosis involving proximal left subclavian:  patient is seen by Dr. Delana Meyer, angiogram today. CTA neck reveals bilateral carotid stenosis  5.chronic restless ness at night;use Ambien at night,iron  levels are within normal limits  6. Social worker f/u for placement Pending acceptance at facility  7. Ascending aortic dilatation F/u with periodic imaging  8.PT and OT f/u  More than 50% time spent in counseling, coordination of care.  All the records are reviewed and case discussed with Care Management/Social Workerr. Management plans discussed with the patient, family and they are in  agreement.  CODE STATUS: DNR  TOTAL TIME TAKING CARE OF THIS PATIENT: 25 minutes.   POSSIBLE D/C IN 1-2DAYS, DEPENDING ON CLINICAL CONDITION.   Saundra Shelling M.D on 04/22/2018 at 1:33 PM  Between 7am to 6pm - Pager - [phone]  After 6pm go to [email or web address] - password [email or web address] [place] [facility]  Office  [phone]  CC: Primary care physician; Einar Pheasant, MD   Note: This dictation was prepared with Dragon dictation along with smaller phrase technology. Any transcriptional errors that result from this process are unintentional.

## 2018-04-22 NOTE — Progress Notes (Signed)
Occupational Therapy Treatment Patient Details Name: Kyle Richardson MRN: [id number] DOB: [birth year] Today's Date: 04/22/2018    History of present illness Pt is an 84 yo male presented to ER secondary to worsening weakness, difficulty with mobility; admitted for TIA/CVA work-up. MRI significant for 40mm infarct to R temporal lobe, subacute/chronic infarcts to L PCA, L BG and L thalamus.   OT comments  Pt seen for OT tx this date. Pt able to sit EOB with close supervision and additional time to perform. Once seated EOB, pt demonstrated good sitting balance. Verbal cues for hand placement to improve technique with CGA x2. Pt stood at the sink with varying UE support on the counter to perform grooming tasks with good inclusion of RUE into bilateral tasks, only requiring CGA when pt was using R hand to squeeze toothpaste onto the toothbrush. Verbal cues to visually scan to R side and attend to RUE during tasks with improving attention to R side t/o session. Pt progressing well this date. Will continue to work towards OT goals. Will benefit from continued OT services after discharge.   Follow Up Recommendations  CIR    Equipment Recommendations  Other (comment)(TBD)    Recommendations for Other Services Rehab consult    Precautions / Restrictions Precautions Precautions: Fall Precaution Comments: R hemiparesis, ataxia Restrictions Weight Bearing Restrictions: No       Mobility Bed Mobility Overal bed mobility: Needs Assistance Bed Mobility: Supine to Sit;Sit to Supine     Supine to sit: Supervision;HOB elevated Sit to supine: Min guard   General bed mobility comments: + time and effort to perform  Transfers Overall transfer level: Needs assistance Equipment used: Rolling walker (2 wheeled) Transfers: Sit to/from Stand Sit to Stand: Min guard;+2 safety/equipment       General transfer comment: cues for hand placement to improve transfer    Balance Overall balance assessment:  Needs assistance Sitting-balance support: No upper extremity supported;Feet supported Sitting balance-Leahy Scale: Fair     Standing balance support: Bilateral upper extremity supported;During functional activity Standing balance-Leahy Scale: Fair                             ADL either performed or assessed with clinical judgement   ADL Overall ADL's : Needs assistance/impaired     Grooming: Standing;Min guard;Oral care;Wash/dry face;Wash/dry hands;Cueing for sequencing Grooming Details (indicate cue type and reason): standing at sink within RW, pt performed grooming tasks with varying UE support on counter, incorporating RUE into activity well with minimal encouragement and improved performance in routine tasks like washing hands. No overt LOB noted.                                      Vision Baseline Vision/History: Wears glasses Wears Glasses: At all times Patient Visual Report: Other (comment)(RHH from previous CVA) Vision Assessment?: Yes Eye Alignment: Within Functional Limits Visual Fields: Right visual field deficit   Perception     Praxis      Cognition Arousal/Alertness: Awake/alert Behavior During Therapy: WFL for tasks assessed/performed Overall Cognitive Status: Impaired/Different from baseline Area of Impairment: Problem solving;Following commands;Attention                   Current Attention Level: Sustained   Following Commands: Follows multi-step commands with increased time;Follows one step commands consistently     Problem Solving: Slow processing;Requires  tactile cues;Requires verbal cues;Difficulty sequencing;Decreased initiation General Comments: Pt needs occasional cueing to attend to R side of body, and to perform actvitites requring R sided motor control.         Exercises     Shoulder Instructions       General Comments      Pertinent Vitals/ Pain       Pain Assessment: No/denies pain Pain  Intervention(s): Monitored during session;Limited activity within patient's tolerance;Repositioned  Home Living                                          Prior Functioning/Environment              Frequency  Min 3X/week        Progress Toward Goals  OT Goals(current goals can now be found in the care plan section)  Progress towards OT goals: Progressing toward goals  Acute Rehab OT Goals Patient Stated Goal: To get better OT Goal Formulation: With patient/family Time For Goal Achievement: 04/30/18 Potential to Achieve Goals: Good  Plan Discharge plan remains appropriate    Co-evaluation                 AM-PAC PT "6 Clicks" Daily Activity     Outcome Measure   Help from another person eating meals?: A Little Help from another person taking care of personal grooming?: A Little Help from another person toileting, which includes using toliet, bedpan, or urinal?: A Lot Help from another person bathing (including washing, rinsing, drying)?: A Lot Help from another person to put on and taking off regular upper body clothing?: A Lot Help from another person to put on and taking off regular lower body clothing?: A Lot 6 Click Score: 14    End of Session Equipment Utilized During Treatment: Gait belt;Rolling walker  OT Visit Diagnosis: Other abnormalities of gait and mobility (R26.89);Hemiplegia and hemiparesis;Ataxia, unspecified (R27.0);Apraxia (R48.2);Low vision, both eyes (H54.2) Hemiplegia - Right/Left: Right Hemiplegia - dominant/non-dominant: Dominant Hemiplegia - caused by: Cerebral infarction   Activity Tolerance Patient tolerated treatment well   Patient Left in bed;with call bell/phone within reach;with bed alarm set;with family/visitor present   Nurse Communication          Time: 1324-4010 OT Time Calculation (min): 23 min  Charges: OT General Charges $OT Visit: 1 Visit OT Treatments $Self Care/Home Management : 23-37 mins    Jeni Salles, MPH, MS, OTR/L ascom [phone] 04/22/18, 1:38 PM

## 2018-04-22 NOTE — Progress Notes (Signed)
OT Cancellation Note  Patient Details Name: Kyle Richardson MRN: [id number] DOB: Jun 30, 1928   Cancelled Treatment:    Reason Eval/Treat Not Completed: Other (comment). OT tx attempted, pt with RN and CNA. Will re-attempt at later time as pt is available.  Jeni Salles, MPH, MS, OTR/L ascom [phone] 04/22/18, 8:53 AM

## 2018-04-22 NOTE — Consult Note (Addendum)
   [facility] CM Inpatient Consult   04/22/2018  Akul Leggette [birth year] [id number]  [facility] Care Management follow up.  Chart reviewed. Noted discharge order written for today.  Telephone call to speak with patient/wife to explain [facility] Management program services. Patient's wife agreeable and verbal consent obtained for [facility] Care Management.  Mrs. Cropp states they plan on going home and not SNF.  Confirms Primary Care MD is Dr. Nicki Reaper [facility] listed as doing transition of care calls). Denies any concerns with transportation or with medications. States "our daughter is a Software engineer".   Explained that [facility] Management will not interfere or replace services provided by home health.  Mrs. Matassa also states that the play on having caregiver assistance at home as well.   Confirmed that Zevin Nevares (wife) to be contacted for post hospital discharge calls at (home) [phone] or (cell)  [phone].  Spoke with inpatient RNCM to make aware [facility] Management will follow.  Will make referral to [place] and [facility] LCSW.  Mr. Nipp has had multiple hospitalizations. Has a medical history of CVA, AFIB, HTN, DM.  Marthenia Rolling, MSN-Ed, RN,BSN [facility] Liaison [phone]

## 2018-04-22 NOTE — Discharge Summary (Signed)
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 Abdominal fibromatosis   . 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  s/p PTCA of the mid RCA 1/96, also  s/p  CABG x 4 7/96  . Degenerative disc disease   . Diabetes mellitus ([place])   . Glaucoma   . Hypercholesterolemia   . Hypertension   . Migraine headache    history of  . Osteoarthritis   . Sleep apnea   . Stroke [facility])      ADMITTING HISTORY Mance Tribby  is a 84 y.o. male with a known history of stroke in April 2019- right sided weakness, sent to Acute rehab, sent home last week after good progress, with some residual weakness and numbness on right side and vision problem on right eye.Since yesterday wife noticed, he is more confused, not able to answer properly, have more weakness on right arm. So she brought him here. After consult with tele neurology- suggested to have full stroke work up.  HOSPITAL COURSE:  Patient was admitted to medical floor and worked up for stroke.  Neurology consultation was done with Dr. Doy Mince during the hospitalization.  Patient was worked up with CT head, CTA neck and MRI and MRA brain. MRI brain showed a right temporal CVA with large left PCA infarct. Patient received therapy with statin, eliquis during hospitalization. Intense  physical therapy and occupational therapy given.Carotid USD showed bilateral carotid stenosis ans patient was evaluated by vascular surgery during his stay at hospital.Initially rehab placement by sociall worker was pursued but insurance did not approve. Family agreed to take patient home with home health services, home PT services.Patient was seen by cardiology during the stay who recommended eliquis, to continue with metoprolol and lipitor. CONSULTS OBTAINED:  Treatment Team:  Alexis Goodell, MD Teodoro Spray, MD Schnier, Dolores Lory, MD  DRUG ALLERGIES:   Allergies  Allergen Reactions  . Clarithromycin Other (See Comments) and Diarrhea  . Codeine   . Flomax [Tamsulosin Hcl]   . Lamisil [Terbinafine]   . Levofloxacin Other (See Comments)    Diplopia. NEVER put patient on levoquin!  . Sertraline Other (See Comments)  . Sulfa Antibiotics     DISCHARGE MEDICATIONS:   Allergies as of 04/22/2018      Reactions   Clarithromycin Other (See Comments), Diarrhea   Codeine    Flomax [tamsulosin Hcl]    Lamisil [terbinafine]    Levofloxacin Other (See Comments)   Diplopia. NEVER put patient on levoquin!   Sertraline Other (See Comments)   Sulfa Antibiotics       Medication List    STOP taking these medications   amLODipine 5 MG tablet Commonly known as:  NORVASC     TAKE these medications   acetaminophen 500 MG tablet Commonly known as:  TYLENOL Take 1 tablet (500 mg total) by mouth every 6 (six) hours as needed.  apixaban 5 MG Tabs tablet Commonly known as:  ELIQUIS Take 1 tablet (5 mg total) by mouth 2 (two) times daily.   atorvastatin 40 MG tablet Commonly known as:  LIPITOR Take 1 tablet (40 mg total) by mouth daily.   divalproex 500 MG 24 hr tablet Commonly known as:  DEPAKOTE ER Take 1 tablet (500 mg total) by mouth daily.   FLUoxetine 10 MG capsule Commonly known as:  PROZAC Take 1 capsule (10 mg total) by mouth daily.   fluticasone 50 MCG/ACT nasal  spray Commonly known as:  FLONASE SPRAY 2 SPRAYS IN EACH NOSTRIL ONCE A DAY   latanoprost 0.005 % ophthalmic solution Commonly known as:  XALATAN Place 1 drop into both eyes at bedtime.   loratadine 10 MG tablet Commonly known as:  CLARITIN Take 10 mg by mouth daily.   metoprolol succinate 25 MG 24 hr tablet Commonly known as:  TOPROL-XL Take 0.5 tablets (12.5 mg total) by mouth daily. What changed:  how much to take   nitroGLYCERIN 0.4 MG SL tablet Commonly known as:  NITROSTAT Place 0.4 mg under the tongue every 5 (five) minutes as needed. As directed       Today  Patient seen on day of discharge Tolerating diet well No complaints  VITAL SIGNS:  Blood pressure 108/66, pulse 72, temperature 97.9 F (36.6 C), temperature source Oral, resp. rate 18, height 5\' 8"  (1.727 m), weight 73.5 kg (162 lb), SpO2 96 %.  I/O:    Intake/Output Summary (Last 24 hours) at 04/22/2018 1535 Last data filed at 04/22/2018 1341 Gross per 24 hour  Intake 480 ml  Output 975 ml  Net -495 ml    PHYSICAL EXAMINATION:  Physical Exam  GENERAL:  84 y.o.-year-old patient lying in the bed with no acute distress.  LUNGS: Normal breath sounds bilaterally, no wheezing, rales,rhonchi or crepitation. No use of accessory muscles of respiration.  CARDIOVASCULAR: S1, S2 normal. No murmurs, rubs, or gallops.  ABDOMEN: Soft, non-tender, non-distended. Bowel sounds present. No organomegaly or mass.  NEUROLOGIC: Moves all 4 extremities. PSYCHIATRIC: The patient is alert and oriented x 3.  SKIN: No obvious rash, lesion, or ulcer.   DATA REVIEW:   CBC Recent Labs  Lab 04/15/18 1550  WBC 7.7  HGB 15.7  HCT 45.3  PLT 192    Chemistries  Recent Labs  Lab 04/15/18 1550  NA 140  K 4.7  CL 103  CO2 30  GLUCOSE 167*  BUN 13  CREATININE 0.93  CALCIUM 9.2  AST 26  ALT 30  ALKPHOS 72  BILITOT 0.4    Cardiac Enzymes Recent Labs  Lab 04/15/18 1550  TROPONINI <0.03    Microbiology  Results  Results for orders placed or performed during the hospital encounter of 02/27/18  Culture, Urine     Status: Abnormal   Collection Time: 03/27/18 11:37 PM  Result Value Ref Range Status   Specimen Description URINE, CLEAN CATCH  Final   Special Requests   Final    NONE Performed at [facility], [place], [place], [place] [address]    Culture MULTIPLE SPECIES PRESENT, SUGGEST RECOLLECTION (A)  Final   Report Status 03/29/2018 FINAL  Final    RADIOLOGY:  Ct Head Wo Contrast  Result Date: 04/20/2018 CLINICAL DATA:  Confusion. Right arm weakness. Recent infarctions right temporal lobe and left occipital lobe. EXAM: CT HEAD WITHOUT CONTRAST TECHNIQUE: Contiguous axial images were obtained from the base of the skull through the  vertex without intravenous contrast. COMPARISON:  04/15/2018 FINDINGS: Brain: No focal brainstem or cerebellar finding. Cerebral hemispheres show generalized atrophy. Late subacute to chronic infarction in the left PCA territory affecting the posteromedial temporal lobe and occipital lobe. Chronic small-vessel ischemic changes elsewhere within the hemispheric white matter. No sign of acute infarction, mass lesion, hemorrhage, hydrocephalus or extra-axial collection. Vascular: There is atherosclerotic calcification of the major vessels at the base of the brain. Skull: Negative Sinuses/Orbits: Clear/normal Other: None IMPRESSION: No acute finding by CT. Late subacute to chronic left PCA territory infarction. Atrophy and chronic small-vessel ischemic changes elsewhere. Electronically Signed   By: Nelson Chimes M.D.   On: 04/20/2018 20:14    Follow up with PCP in 1 week.  Management plans discussed with the patient, family and they are in agreement.  CODE STATUS: Full code    Code Status Orders  (From admission, onward)        Start     Ordered   04/15/18 2120  Full code  Continuous     04/15/18 2120    Code Status History    Date Active Date  Inactive Code Status Order ID Comments User Context   02/27/2018 1544 04/01/2018 1415 Full Code [id number]  Flora Lipps Inpatient   02/27/2018 1544 02/27/2018 1544 Full Code [id number]  Flora Lipps Inpatient   02/23/2018 0018 02/27/2018 1528 Full Code [id number]  Lance Coon, MD Inpatient    Advance Directive Documentation     Most Recent Value  Type of Advance Directive  Healthcare Power of Attorney  Pre-existing out of facility DNR order (yellow form or pink MOST form)  -  "MOST" Form in Place?  -      TOTAL TIME TAKING CARE OF THIS PATIENT ON DAY OF DISCHARGE: more than 35 minutes.   Saundra Shelling M.D on 04/22/2018 at 3:35 PM  Between 7am to 6pm - Pager - [phone]  After 6pm go to [email or web address] - password [email or web address] [place] [facility]  Office  [phone]  CC: Primary care physician; Einar Pheasant, MD  Note: This dictation was prepared with Dragon dictation along with smaller phrase technology. Any transcriptional errors that result from this process are unintentional.

## 2018-04-22 NOTE — Care Management (Signed)
Discussed home health agencies with Ms. Bruneau at the bedside. Would like to go with [facility] again. Floydene Flock, [facility] representative updated.  Discharge to home today per Dr. Estanislado Pandy. Family will transport Shelbie Ammons RN MSN CCM Care Management [phone]

## 2018-04-22 NOTE — Care Management Important Message (Signed)
Copy of signed IM left with patient in room.  

## 2018-04-22 NOTE — Progress Notes (Signed)
Inpatient Rehabilitation  Reviewed chart and note events over the weekend.  Called and spoke with Tammy with [employer] this morning who confirmed that case was denied for an IP Rehab admission and per [facility] CSW team felt a peer to peer was not warranted.  Called and notified spouse, [name] of denial this morning and advised that nurse case manager and CSW would be following up with options today.  Will sign off at this time.    Carmelia Roller., CCC/SLP Admission Coordinator  [place]  Cell [phone]

## 2018-04-22 NOTE — Consult Note (Addendum)
   [facility] CM Inpatient Consult   04/22/2018  Kyle Richardson May 10, 1928 [id number]    Made aware by [place] that patient was previously referred to [place] [facility] by [facility]. However, patient readmitted back to the hospital prior to contact.  Spoke with inpatient RNCM to discuss [facility] follow up. Writer made aware that disposition is not clear at this time.   Made inpatient RNCM aware that writer will contact patient/family once  disposition plans are known.    Marthenia Rolling, MSN-Ed, RN,BSN [facility] Liaison [phone]

## 2018-04-22 NOTE — Clinical Social Work Note (Signed)
CSW attempted to call patient's spouse Kyle Richardson [phone] with no success. CSW contacted patient's daughter Kyle Richardson [phone]. Daughter states that they have decided to take patient home with home health. She states that she can pick up patient later today to transport him home with wife. CSW notified RNCM of above. CSW signing off. Please re consult if further needs arise.   [place], [place]

## 2018-04-23 ENCOUNTER — Telehealth: Payer: Self-pay | Admitting: Internal Medicine

## 2018-04-23 ENCOUNTER — Other Ambulatory Visit: Payer: Self-pay

## 2018-04-23 DIAGNOSIS — I2510 Atherosclerotic heart disease of native coronary artery without angina pectoris: Secondary | ICD-10-CM | POA: Diagnosis not present

## 2018-04-23 DIAGNOSIS — E119 Type 2 diabetes mellitus without complications: Secondary | ICD-10-CM | POA: Diagnosis not present

## 2018-04-23 DIAGNOSIS — I69351 Hemiplegia and hemiparesis following cerebral infarction affecting right dominant side: Secondary | ICD-10-CM | POA: Diagnosis not present

## 2018-04-23 DIAGNOSIS — Z951 Presence of aortocoronary bypass graft: Secondary | ICD-10-CM | POA: Diagnosis not present

## 2018-04-23 DIAGNOSIS — I10 Essential (primary) hypertension: Secondary | ICD-10-CM | POA: Diagnosis not present

## 2018-04-23 DIAGNOSIS — G4733 Obstructive sleep apnea (adult) (pediatric): Secondary | ICD-10-CM | POA: Diagnosis not present

## 2018-04-23 DIAGNOSIS — Z7901 Long term (current) use of anticoagulants: Secondary | ICD-10-CM | POA: Diagnosis not present

## 2018-04-23 DIAGNOSIS — G7000 Myasthenia gravis without (acute) exacerbation: Secondary | ICD-10-CM | POA: Diagnosis not present

## 2018-04-23 DIAGNOSIS — H409 Unspecified glaucoma: Secondary | ICD-10-CM | POA: Diagnosis not present

## 2018-04-23 DIAGNOSIS — I69320 Aphasia following cerebral infarction: Secondary | ICD-10-CM | POA: Diagnosis not present

## 2018-04-23 DIAGNOSIS — H532 Diplopia: Secondary | ICD-10-CM | POA: Diagnosis not present

## 2018-04-23 DIAGNOSIS — I480 Paroxysmal atrial fibrillation: Secondary | ICD-10-CM | POA: Diagnosis not present

## 2018-04-23 NOTE — Patient Outreach (Signed)
[facility]) Care Management  04/23/2018  Kyle Richardson 09/30/1928 [id number]   Care Coordination: Successful telephone encounter to Kyle Richardson, spouse to the above patient and listed as contact by hospital liasion. [facility] RN CM introduced role of [facility] Community Case Management. Verbal consent for participation in the [place] Management program obtained. Initial home visit scheduled for Wednesday, July 17 at [name], 24 hours nurse line, and RN CM.  Plan: Initial home visit as planned for tomorrow  [name] E. Rollene Rotunda RN, BSN [facility] Care Management Coordinator [phone]   .

## 2018-04-23 NOTE — Telephone Encounter (Signed)
Copied from [place] [phone]. Topic: General - Other >> Apr 23, 2018  2:35 PM Lennox Solders wrote: Reason for [id number]:[id number] rn adv home care is calling and needs order to resume care , also needs SP and home health aid orders

## 2018-04-23 NOTE — Telephone Encounter (Signed)
Verbals given  

## 2018-04-24 ENCOUNTER — Other Ambulatory Visit: Payer: Self-pay

## 2018-04-24 NOTE — Patient Outreach (Signed)
[facility]) Care Management   04/24/2018  Kyle Richardson August 16, 1928 [id number]  Kyle Richardson is an 84 y.o. male  Subjective: "I have had a stroke and I need help to do things". Patient, wife, and daughter states they have arranged for 24/7 CNA private care until the patients long term in-home care policy "kicks in". Daughter states she needs help finding and choosing a vetted [facility] agency that will provide long term in home care. Wife states need for help with meals. Patient acknowledges no longer being able to help care for their special needs adult son that lives in the home.  Objective:   BP (!) 120/48 (BP Location: Left Arm, Patient Position: Sitting, Cuff Size: Normal)   Pulse 62   Resp 18   Wt 160 lb (72.6 kg)   SpO2 98%   BMI 24.33 kg/m   Physical Exam  Constitutional: He is oriented to person, place, and time. He appears well-developed and well-nourished.  Eyes: Pupils are equal, round, and reactive to light.  Neck: Normal range of motion.  Cardiovascular: Normal rate, normal heart sounds and intact distal pulses.  Irregular pulse  Respiratory: Effort normal and breath sounds normal. No respiratory distress.  GI: Soft. Bowel sounds are normal.  Genitourinary:  Genitourinary Comments: Reports good urination with steady stream  Musculoskeletal: He exhibits no edema.  Neurological: He is alert and oriented to person, place, and time. Coordination abnormal.  Grips equal, right arm weakness, poor coordination with right side mobility. Asked patient to touch right ear with right finger. Patient touched right side back of head, nose, and finally ear on third try. Patient unable to answer some questions appropriately with correct wording. Unable to recognize he was using incorrect words or not answering question correctly.  Skin: Skin is warm and dry.  Psychiatric: He has a normal mood and affect. His behavior is normal.    Encounter Medications:   Outpatient  Encounter Medications as of 04/24/2018  Medication Sig Note  . acetaminophen (TYLENOL) 500 MG tablet Take 1 tablet (500 mg total) by mouth every 6 (six) hours as needed.   Marland Kitchen apixaban (ELIQUIS) 5 MG TABS tablet Take 1 tablet (5 mg total) by mouth 2 (two) times daily.   . divalproex (DEPAKOTE ER) 500 MG 24 hr tablet Take 1 tablet (500 mg total) by mouth daily.   Marland Kitchen FLUoxetine (PROZAC) 10 MG capsule Take 1 capsule (10 mg total) by mouth daily.   . fluticasone (FLONASE) 50 MCG/ACT nasal spray SPRAY 2 SPRAYS IN EACH NOSTRIL ONCE A DAY   . latanoprost (XALATAN) 0.005 % ophthalmic solution Place 1 drop into both eyes at bedtime.   Marland Kitchen loratadine (CLARITIN) 10 MG tablet Take 10 mg by mouth daily.   Marland Kitchen atorvastatin (LIPITOR) 40 MG tablet Take 1 tablet (40 mg total) by mouth daily. 04/24/2018: Patient it taking 1 20 mg tab as previously prescribed  . metoprolol succinate (TOPROL-XL) 25 MG 24 hr tablet Take 0.5 tablets (12.5 mg total) by mouth daily. 04/24/2018: Patient is taking 25 mg daily  . nitroGLYCERIN (NITROSTAT) 0.4 MG SL tablet Place 0.4 mg under the tongue every 5 (five) minutes as needed. As directed    No facility-administered encounter medications on file as of 04/24/2018.     Functional Status:   In your present state of health, do you have any difficulty performing the following activities: 04/24/2018 04/20/2018  Hearing? Y Y  Vision? - N  Difficulty concentrating or making decisions? Y Y  Comment - -  Walking or climbing stairs? Y Y  Comment - -  Dressing or bathing? Y Y  Doing errands, shopping? Tempie Donning  Preparing Food and eating ? Y -  Using the Toilet? Y -  In the past six months, have you accidently leaked urine? N -  Do you have problems with loss of bowel control? N -  Managing your Medications? Y -  Managing your Finances? Y -  Housekeeping or managing your Housekeeping? Y -  Some recent data might be hidden    Fall/Depression Screening:    Fall Risk  04/24/2018 08/21/2017  08/21/2016  Falls in the past year? No No No  Injury with Fall? No - -  Risk for fall due to : Impaired balance/gait;Impaired mobility;Impaired vision;Other (Comment) - -  Risk for fall due to: Comment right sided weakness from recent strokes - -   PHQ 2/9 Scores 04/24/2018 08/21/2017 08/21/2016 08/20/2015 12/17/2014 02/03/2013 02/03/2013  PHQ - 2 Score 0 0 0 0 0 0 0  PHQ- 9 Score - 0 - - - - -    Assessment:  Daughter and wife present during home visit. Patient able to communicate effectively with RN CM utilizing thought redirection techniques and encouraging patient to slow his speech. Patient tired easily with answering questions and was assisted to the recliner by his daughter. Much of the information obtained was from wife and daughter. Wife Kyle Richardson and Daughter Kyle Richardson are patients HCPOA.  Patient doing well. Eating muffin and drinking water without difficulty when [facility] RN CM arrived. Having difficulty using right arm. Unable to ambulate. Daughter using gaitbelt for transfer. Patient able to stand and pivot. Daughter states patient has 24/7 hired private CNA caregiver until longterm policy "kicks in". She believes it is 90 days. The private CNAs that currently staffs the home is not appropriate for the longterm care policy per daughter. Daughter request collaboration with [facility] CSW for appropriate LTC in home referral. RN CM has collaborated with [employer], CSW post in-home visit to discuss needs of daughter/patient. RN CM observed CNA caring for patient while in home. Wife states HH Intake RN did initial assessment Tuesday. Has recommended Speech, PT, and OT. Wife and daughter concerned because HTA denied recommended [place] services after patient was discharged from inpatient rehab with first stroke several months ago.  Medications reviewed. Discrepancies noted for metoprolol and Lipitor dosage. Patient currently taking less of lipitor prescribed at discharge and more of metoprolol. Medications  administered by wife out of pill bottles as directed. Consulted with [facility] Cardiology and patients pharmacy. New prescription for correct dosage of medications called to pharmacy by cardiology and wife and daughter notified by RN CM. Wife Kyle Richardson states she will pick up Thursday. RN CM reinforced disposal of old medications. RN CM will discuss pill box at next encounter.  RN CM discussed importance of medication adherence, fall safety, daily BP monitoring/recording with family and CNA.   Plan: Follow up phone call in two weeks.  [facility] CM Care Plan Problem One     Most Recent Value  Care Plan Problem One  High Risk for additional stroke/stroke related injury related to blocked carotid/Afib  Role Documenting the Problem One  Care Management [place] for Problem One  Active  THN Long Term Goal   patient will not have readmission to hospital within the next 31 days  THN Long Term Goal Start Date  04/24/18  Interventions for Problem One Long Term Goal  reviewed EMMI education on lowering risk  of stroke with caregivers, reviewed EMMI education Caring for loved one after stroke which includes safty measure to take in home with caregivers  [facility] CM Short Term Goal #1   patient will remain free from falls over the next 30 days  THN CM Short Term Goal #1 Start Date  04/24/18  Interventions for Short Term Goal #1  discussed detailed safety precautions (remove loose rugs, wear non skid shoes, ...) related to stroke with caregivers and patient  THN CM Short Term Goal #2   patient will take all medications as prescribed over the next 30 days  THN CM Short Term Goal #2 Start Date  04/24/18  Interventions for Short Term Goal #2  medication reconciliation, clarified with MD medication discrepencies, facilitated new order for correct medicaton dosages based on D/C summary.     Tanyla Stege E. Rollene Rotunda RN, BSN [facility] Care Management Coordinator [phone]

## 2018-04-25 ENCOUNTER — Other Ambulatory Visit: Payer: Self-pay

## 2018-04-25 ENCOUNTER — Telehealth: Payer: Self-pay

## 2018-04-25 NOTE — Patient Outreach (Signed)
[facility]) Care Management  04/25/2018  Sloane Junkin 12/02/1927 [id number]   RN CM received Red EMMI alert.  Patient was reached, medications, discharge papers, and who to call for discharge conditions were flagged. Patient is post stroke and unable to manipulate the phone well. All above areas were addressed during initial home visit that occurred same day as EMMI call. Discharge papers in the home in caregivers (wife/daughters) possession, medication discrepancies resolved, and caregivers aware of which provider to call with questions or concerns related to patients discharge diagnosis.  Corran Lalone E. Rollene Rotunda RN, BSN [facility] Care Management Coordinator [phone]

## 2018-04-25 NOTE — Telephone Encounter (Signed)
Copied from [place] [id number]. Topic: General - Other >> Apr 25, 2018  3:22 PM Adelene Idler wrote: [name] from [facility] is calling in for verbal orders. Req 1 week 1 , 2 week 4. Home Health Physical Therapy  [place]#[phone]  Verbal orders given to Jat[place] from [place] care for PT.

## 2018-04-26 ENCOUNTER — Other Ambulatory Visit: Payer: Self-pay | Admitting: *Deleted

## 2018-04-26 NOTE — Patient Outreach (Signed)
[facility]) Care Management  04/26/2018  Kyle Richardson 10/07/1928 [id number]   Patient's community resource needs discussed with RNCM Trish Fountain. Patient's family requesting resources for in home care and meals. Phone call to patient's daughter-Kyle Richardson  to discuss in home care options and Meals on Wheels referral for patient.   Voicemail message left for a return call.   Sheralyn Boatman [facility] Care Management [phone]

## 2018-04-27 DIAGNOSIS — I639 Cerebral infarction, unspecified: Secondary | ICD-10-CM | POA: Diagnosis not present

## 2018-04-28 ENCOUNTER — Other Ambulatory Visit: Payer: Self-pay | Admitting: Internal Medicine

## 2018-04-28 DIAGNOSIS — I63532 Cerebral infarction due to unspecified occlusion or stenosis of left posterior cerebral artery: Secondary | ICD-10-CM

## 2018-04-28 DIAGNOSIS — H539 Unspecified visual disturbance: Secondary | ICD-10-CM

## 2018-04-28 NOTE — Progress Notes (Signed)
Order placed for ophthalmology referral  

## 2018-04-29 ENCOUNTER — Telehealth: Payer: Self-pay | Admitting: Internal Medicine

## 2018-04-29 ENCOUNTER — Encounter: Payer: Self-pay | Admitting: Internal Medicine

## 2018-04-29 ENCOUNTER — Other Ambulatory Visit: Payer: Self-pay | Admitting: *Deleted

## 2018-04-29 DIAGNOSIS — I10 Essential (primary) hypertension: Secondary | ICD-10-CM | POA: Diagnosis not present

## 2018-04-29 DIAGNOSIS — I69320 Aphasia following cerebral infarction: Secondary | ICD-10-CM | POA: Diagnosis not present

## 2018-04-29 DIAGNOSIS — G4733 Obstructive sleep apnea (adult) (pediatric): Secondary | ICD-10-CM | POA: Diagnosis not present

## 2018-04-29 DIAGNOSIS — E119 Type 2 diabetes mellitus without complications: Secondary | ICD-10-CM

## 2018-04-29 DIAGNOSIS — Z951 Presence of aortocoronary bypass graft: Secondary | ICD-10-CM

## 2018-04-29 DIAGNOSIS — I480 Paroxysmal atrial fibrillation: Secondary | ICD-10-CM | POA: Diagnosis not present

## 2018-04-29 DIAGNOSIS — G7000 Myasthenia gravis without (acute) exacerbation: Secondary | ICD-10-CM | POA: Diagnosis not present

## 2018-04-29 DIAGNOSIS — H532 Diplopia: Secondary | ICD-10-CM | POA: Diagnosis not present

## 2018-04-29 DIAGNOSIS — H409 Unspecified glaucoma: Secondary | ICD-10-CM | POA: Diagnosis not present

## 2018-04-29 DIAGNOSIS — I2510 Atherosclerotic heart disease of native coronary artery without angina pectoris: Secondary | ICD-10-CM | POA: Diagnosis not present

## 2018-04-29 DIAGNOSIS — Z7901 Long term (current) use of anticoagulants: Secondary | ICD-10-CM | POA: Diagnosis not present

## 2018-04-29 NOTE — Telephone Encounter (Signed)
Verbals given  

## 2018-04-29 NOTE — Patient Outreach (Addendum)
[facility]) Care Management  04/29/2018  Kyle Richardson Feb 12, 1928 [id number]   Return phone call from patient's daughter [name] who states that she is needing resources for in home care for patient following his stroke. Patient's daughter visits regularly but lives 1 hour a way. Patient has a son that lives in the home who reportedly has special needs. Per patient's daughter, he was the one who did all of the cooking, laundry, grocery shopping and transportation for himself and his spouse. Now that he has had his stoke, he is unable to take care of his care needs for himself and his spouse. Per patient's daugther they are in need of an agency that will work within the guidelines of their long term care insurance. Patient's daughter explained that they currently have CNA's that they have hired, however the long term care insurance plan does not cover independent private  duty aids.   They would need to be connected to an agency. Meals on Wheels referral process also discussed. Per patient's daughter, she would like to hold off on making a referral to the meals on wheels program as the current CNA's working with the family are providing this service.    Plan: This social worker will forward patient's daughter a list of in home care providers from the [place] [facility] Directory to review for possible in home assistance.   Sheralyn Boatman [facility] Care Management [phone]

## 2018-04-29 NOTE — Telephone Encounter (Signed)
Copied from [place] [id number]. Topic: General - Other >> Apr 29, 2018  4:16 PM Margot Ables wrote: Requesting orders for OT 1 week 1 and 2 week 4 Secure VM ok to leave detailed msg

## 2018-04-29 NOTE — Telephone Encounter (Signed)
Copied from [place] [phone]. Topic: Quick Communication - See Telephone Encounter >> Apr 29, 2018 11:48 AM Conception Chancy, NT wrote: CRM for notification. See Telephone encounter for: 04/29/18.  Amy is a speech therapist calling from [place] requesting verbal orders for 2x a week for 4 weeks.   Amy cb# [phone]

## 2018-04-29 NOTE — Telephone Encounter (Signed)
LMTCB. OK to give verbals. 

## 2018-04-30 NOTE — Telephone Encounter (Signed)
Need clarification on exactly what they need.  See me before calling pt.  Physical therapy is seeing him now (home health PT).  Can they assess what is needed and let us know.  I do not mind prescribing anything that is needed.

## 2018-05-02 ENCOUNTER — Encounter (INDEPENDENT_AMBULATORY_CARE_PROVIDER_SITE_OTHER): Payer: Self-pay

## 2018-05-03 ENCOUNTER — Telehealth: Payer: Self-pay

## 2018-05-03 DIAGNOSIS — H53461 Homonymous bilateral field defects, right side: Secondary | ICD-10-CM | POA: Diagnosis not present

## 2018-05-03 LAB — HM DIABETES EYE EXAM

## 2018-05-03 NOTE — Telephone Encounter (Signed)
Copied from [place] [id number]. Topic: General - Other >> May 03, 2018  4:02 PM Adelene Idler wrote: [name] from [place] called in and stated patient has missed appointment.  CB# [phone]

## 2018-05-06 NOTE — Telephone Encounter (Signed)
Wife stated to disregard message

## 2018-05-06 NOTE — Telephone Encounter (Signed)
How did pt miss appt for OT.  Are they just notifying he was at another appt or I am I supposed to do something with this

## 2018-05-06 NOTE — Telephone Encounter (Signed)
[name] stated that this was just [employer]

## 2018-05-06 NOTE — Telephone Encounter (Signed)
FYI

## 2018-05-09 ENCOUNTER — Ambulatory Visit (INDEPENDENT_AMBULATORY_CARE_PROVIDER_SITE_OTHER): Payer: PPO | Admitting: Internal Medicine

## 2018-05-09 DIAGNOSIS — I712 Thoracic aortic aneurysm, without rupture: Secondary | ICD-10-CM

## 2018-05-09 DIAGNOSIS — I7121 Aneurysm of the ascending aorta, without rupture: Secondary | ICD-10-CM

## 2018-05-09 DIAGNOSIS — I63532 Cerebral infarction due to unspecified occlusion or stenosis of left posterior cerebral artery: Secondary | ICD-10-CM | POA: Diagnosis not present

## 2018-05-09 DIAGNOSIS — I10 Essential (primary) hypertension: Secondary | ICD-10-CM

## 2018-05-09 DIAGNOSIS — I480 Paroxysmal atrial fibrillation: Secondary | ICD-10-CM | POA: Diagnosis not present

## 2018-05-09 DIAGNOSIS — I2510 Atherosclerotic heart disease of native coronary artery without angina pectoris: Secondary | ICD-10-CM | POA: Diagnosis not present

## 2018-05-09 DIAGNOSIS — E119 Type 2 diabetes mellitus without complications: Secondary | ICD-10-CM

## 2018-05-09 DIAGNOSIS — E7800 Pure hypercholesterolemia, unspecified: Secondary | ICD-10-CM

## 2018-05-09 NOTE — Progress Notes (Signed)
Patient ID: Kyle Richardson, male   DOB: 1928-07-01, 84 y.o.   MRN: [id number]   Subjective:    Patient ID: Kyle Richardson, male    DOB: 17-Jan-1928, 84 y.o.   MRN: [id number]  HPI  Patient here for a scheduled follow up.  Recently admitted 04/15/18 with concerns regarding recurring stroke.  Diagnosed with acute right temporal CVA - embolic CVA - left PCA infarct.  Neurology consulted and work up included head CT, CTA neck and MRI and MRA brain.  MRI showed a right temporal CVA with large left PCA infarct.  Carotid ultrasound revealed bilateral carotid stenosis and vascular surgery evaluated.  PT worked with pt.  In patient therapy was denied.  Was discharged with home PT.  Comes in today accompanied by his wife and caretaker.  History obtained from all three of them.  Reports he is doing better.  Feels better.  Eating.  No problems swallowing.  No abdominal pain.  Bowels moving.  Some increased problems at night when sleeping.  It appears to be more related to him not being able to move his arm without having to really think about it.  Working with therapy.  Walking more.     Past Medical History:  Diagnosis Date  . Abdominal fibromatosis   . 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  s/p PTCA of the mid RCA 1/96, also  s/p  CABG x 4 7/96  . Degenerative disc disease   . Diabetes mellitus ([place])   . Glaucoma   . Hypercholesterolemia   . Hypertension   . Migraine headache    history of  . Osteoarthritis   . Sleep apnea   . Stroke [facility])    Past Surgical History:  Procedure Laterality Date  . APPENDECTOMY    . CAROTID ANGIOGRAPHY Bilateral 04/19/2018   Procedure: CAROTID ANGIOGRAPHY;  Surgeon: Algernon Huxley, MD;  Location: [facility];  Service: Cardiovascular;  Laterality: Bilateral;  . CHOLECYSTECTOMY     Removed  . CORONARY ARTERY BYPASS GRAFT  7/96   x4  . HEMORRHOID SURGERY    . HERNIA REPAIR    . UPP and tonsillectomy  1/86   Family History    Problem Relation Age of Onset  . Heart disease Mother        died age 40  . Heart disease Father        and other vascular disease  . Diabetes Unknown        four siblings  . Heart disease Brother        s/p CABG  . Colon cancer Neg Hx   . Prostate cancer Neg Hx    Social History   Socioeconomic History  . Marital status: Married    Spouse name: [name]  . Number of children: 2  . Years of education: come college  . Highest education level: Associate degree: occupational, Hotel manager, or vocational program  Occupational History  . Occupation: retired    Fish farm manager: [employer]  Social Needs  . Financial resource strain: Not hard at all  . Food insecurity:    Worry: Never true    Inability: Never true  . Transportation needs:    Medical: No    Non-medical: No  Tobacco Use  . Smoking status: Never Smoker  . Smokeless tobacco: Never Used  Substance and Sexual Activity  . Alcohol use: No    Alcohol/week: 0.0 oz  . Drug use: No  . Sexual  activity: Not Currently  Lifestyle  . Physical activity:    Days per week: 0 days    Minutes per session: 0 min  . Stress: Not at all  Relationships  . Social connections:    Talks on phone: Never    Gets together: Three times a week    Attends religious service: More than 4 times per year    Active member of club or organization: No    Attends meetings of clubs or organizations: Never    Relationship status: Married  Other Topics Concern  . Not on file  Social History Narrative  . Not on file    Outpatient Encounter Medications as of 05/09/2018  Medication Sig  . acetaminophen (TYLENOL) 500 MG tablet Take 1 tablet (500 mg total) by mouth every 6 (six) hours as needed.  Marland Kitchen apixaban (ELIQUIS) 5 MG TABS tablet Take 1 tablet (5 mg total) by mouth 2 (two) times daily.  Marland Kitchen atorvastatin (LIPITOR) 40 MG tablet Take 1 tablet (40 mg total) by mouth daily.  . divalproex (DEPAKOTE ER) 500 MG 24 hr tablet Take 1 tablet (500 mg total) by mouth daily.  Marland Kitchen  FLUoxetine (PROZAC) 10 MG capsule Take 1 capsule (10 mg total) by mouth daily.  . fluticasone (FLONASE) 50 MCG/ACT nasal spray SPRAY 2 SPRAYS IN EACH NOSTRIL ONCE A DAY  . latanoprost (XALATAN) 0.005 % ophthalmic solution Place 1 drop into both eyes at bedtime.  Marland Kitchen loratadine (CLARITIN) 10 MG tablet Take 10 mg by mouth daily.  . metoprolol succinate (TOPROL-XL) 25 MG 24 hr tablet Take 0.5 tablets (12.5 mg total) by mouth daily.  . nitroGLYCERIN (NITROSTAT) 0.4 MG SL tablet Place 0.4 mg under the tongue every 5 (five) minutes as needed. As directed   No facility-administered encounter medications on file as of 05/09/2018.     Review of Systems  Constitutional: Negative for appetite change and unexpected weight change.  HENT: Negative for congestion and sinus pressure.   Respiratory: Negative for cough, chest tightness and shortness of breath.   Cardiovascular: Negative for chest pain, palpitations and leg swelling.  Gastrointestinal: Negative for abdominal pain, diarrhea, nausea and vomiting.  Genitourinary: Negative for difficulty urinating and dysuria.  Musculoskeletal: Negative for joint swelling and myalgias.  Skin: Negative for color change and rash.  Neurological: Negative for dizziness, light-headedness and headaches.  Psychiatric/Behavioral: Negative for agitation and dysphoric mood.       Objective:    Physical Exam  Constitutional: He appears well-developed and well-nourished. No distress.  HENT:  Nose: Nose normal.  Mouth/Throat: Oropharynx is clear and moist.  Neck: Neck supple.  Cardiovascular: Normal rate.  Rate controlled.    Pulmonary/Chest: Effort normal and breath sounds normal. No respiratory distress.  Abdominal: Soft. Bowel sounds are normal. There is no tenderness.  Musculoskeletal: He exhibits no edema or tenderness.  Weakness - right arm.  Can lift and squeeze on command.    Lymphadenopathy:    He has no cervical adenopathy.  Skin: No rash noted. No  erythema.  Psychiatric: He has a normal mood and affect. His behavior is normal.    BP 136/68 (BP Location: Left Arm, Patient Position: Sitting, Cuff Size: Normal)   Pulse 64   Temp (!) 97.5 F (36.4 C) (Oral)   Resp 18   SpO2 99%  Wt Readings from Last 3 Encounters:  04/24/18 160 lb (72.6 kg)  04/19/18 162 lb (73.5 kg)  03/27/18 160 lb 15 oz (73 kg)     Lab  Results  Component Value Date   WBC 7.7 04/15/2018   HGB 15.7 04/15/2018   HCT 45.3 04/15/2018   PLT 192 04/15/2018   GLUCOSE 167 (H) 04/15/2018   CHOL 159 04/16/2018   TRIG 288 (H) 04/16/2018   HDL 35 (L) 04/16/2018   LDLDIRECT 81.0 10/12/2017   LDLCALC 66 04/16/2018   ALT 30 04/15/2018   AST 26 04/15/2018   NA 140 04/15/2018   K 4.7 04/15/2018   CL 103 04/15/2018   CREATININE 0.93 04/15/2018   BUN 13 04/15/2018   CO2 30 04/15/2018   TSH 2.098 03/29/2018   PSA 0.66 05/05/2016   INR 1.00 04/15/2018   HGBA1C 6.8 (H) 04/16/2018   MICROALBUR 2.2 (H) 10/12/2017    Dg Chest 2 View  Result Date: 04/15/2018 CLINICAL DATA:  Right-sided weakness. EXAM: CHEST - 2 VIEW COMPARISON:  Radiographs of March 29, 2018. FINDINGS: Stable cardiomediastinal silhouette. Status post coronary artery bypass graft. Atherosclerosis of thoracic aorta is noted. No pneumothorax or pleural effusion is noted. No acute pulmonary disease is noted. Bony thorax is unremarkable. IMPRESSION: No active cardiopulmonary disease. Aortic Atherosclerosis (ICD10-I70.0). Electronically Signed   By: Marijo Conception, M.D.   On: 04/15/2018 16:45   Ct Head Wo Contrast  Result Date: 04/15/2018 CLINICAL DATA:  Progressed confusion and weakness.  Recent stroke. EXAM: CT HEAD WITHOUT CONTRAST TECHNIQUE: Contiguous axial images were obtained from the base of the skull through the vertex without intravenous contrast. COMPARISON:  MRI of the head March 28, 2018 FINDINGS: BRAIN: No intraparenchymal hemorrhage, mass effect nor midline shift. LEFT mesial occipital lobe  encephalomalacia with mild ex vacuo dilatation LEFT atrium. Small area contiguous LEFT thalamus encephalomalacia. Old LEFT basal ganglia and LEFT thalamus lacunar infarcts. No global parenchymal brain volume loss for age. Patchy supratentorial white matter hypodensities less than expected for patient's age, though non-specific are most compatible with chronic small vessel ischemic disease. No acute large vascular territory infarcts. No abnormal extra-axial fluid collections. Basal cisterns are patent. VASCULAR: Severe calcific atherosclerosis of the carotid siphons. SKULL: No skull fracture. No significant scalp soft tissue swelling. SINUSES/ORBITS: Mild paranasal sinus mucosal thickening. Included mastoid air cells are well aerated.The included ocular globes and orbital contents are non-suspicious. Status post ocular lens implants. OTHER: None. IMPRESSION: 1. No acute intracranial process. 2. Subacute to chronic large LEFT PCA territory infarct. 3. Old LEFT basal ganglia and thalamus lacunar infarcts. Mild chronic small vessel ischemic changes. Electronically Signed   By: Elon Alas M.D.   On: 04/15/2018 16:31   Ct Angio Neck W Or Wo Contrast  Result Date: 04/16/2018 CLINICAL DATA:  Initial evaluation for increased confusion and right-sided weakness, recent stroke. EXAM: CT ANGIOGRAPHY NECK TECHNIQUE: Multidetector CT imaging of the neck was performed using the standard protocol during bolus administration of intravenous contrast. Multiplanar CT image reconstructions and MIPs were obtained to evaluate the vascular anatomy. Carotid stenosis measurements (when applicable) are obtained utilizing NASCET criteria, using the distal internal carotid diameter as the denominator. CONTRAST:  54m OMNIPAQUE IOHEXOL 350 MG/ML SOLN COMPARISON:  Prior ultrasound from 02/23/2018 FINDINGS: Aortic arch: Visualized ascending aorta dilated up to 4.4 cm in diameter. Sequelae of prior CABG partially visualized. Arch itself of  relative normal caliber with normal branch pattern. Moderate atheromatous plaque within the visualized aorta and about the origin of the great vessels without hemodynamically significant stenosis. Short-segment stenosis of approximately 80% present at the proximal left subclavian artery just proximal to the takeoff of the left vertebral artery (  series 8, image 152). Visualized subclavian arteries otherwise patent without significant stenosis. Right carotid system: Right common carotid artery tortuous proximally. Scattered eccentric calcified plaque throughout the right common carotid artery with stenosis of up to 70% by NASCET criteria (series 7, image 149). Complex irregular plaque about the proximal right ICA with resultant severe stenosis of up to 75% by NASCET criteria (series 7, image 195) and measures approximately 2 cm in length. Right ICA widely patent distally to the skull base without stenosis. Prominent vascular calcifications noted within the carotid siphons. At the bifurcation Left carotid system: Left common carotid artery tortuous proximally. Scattered calcified plaque throughout the left common carotid artery without significant stenosis. Calcified plaque at the left bifurcation with associated stenosis of up to approximately 70% by NASCET criteria (series 7, image 181). Left ICA widely patent distally to the circle-of-Willis without stenosis or occlusion. Atheromatous plaque noted within the left carotid siphon. Vertebral arteries: Both of the vertebral arteries arise from the subclavian arteries. Focal plaque at the origin of the vertebral arteries bilaterally with relatively mild stenosis. Vertebral arteries otherwise widely patent within the neck without stenosis or occlusion. Mild plaque within the V4 segments bilaterally without significant stenosis. Vertebral arteries are largely codominant. Skeleton: No acute osseus abnormality. No worrisome lytic or blastic osseous lesions. Moderate cervical  spondylolysis noted at C6-7. Median sternotomy wires noted. Other neck: No acute soft tissue abnormality within the neck. No adenopathy. Salivary glands normal. Thyroid normal. Mild chronic maxillary sinusitis. Upper chest: Visualized upper chest and mediastinum demonstrates no acute abnormality. Coronary artery calcifications partially visualized. Scattered atelectatic changes noted within the visualized lungs. IMPRESSION: 1. Extensive atheromatous plaque about the carotid bifurcations/proximal ICAs bilaterally with associated stenosis of up to 75% on the right and 70% on the left. 2. Atheromatous narrowing of up to 70% within the right common carotid artery. 3. Widely patent vertebral arteries within the neck. 4. Short-segment 80% atheromatous stenosis involving the proximal left subclavian artery. 5. Dilatation of the ascending aorta up to 4.4 cm in diameter. Recommend annual imaging followup by CTA or MRA. This recommendation follows 2010 ACCF/AHA/AATS/ACR/ASA/SCA/SCAI/SIR/STS/SVM Guidelines for the Diagnosis and Management of Patients with Thoracic Aortic Disease. Circulation. 2010; 121: I458-K998 Electronically Signed   By: Jeannine Boga M.D.   On: 04/16/2018 02:32   Mr Brain Wo Contrast  Result Date: 04/16/2018 CLINICAL DATA:  Initial evaluation for increased confusion, worsened right arm weakness. Recent stroke. EXAM: MRI HEAD WITHOUT CONTRAST MRA HEAD WITHOUT CONTRAST TECHNIQUE: Multiplanar, multiecho pulse sequences of the brain and surrounding structures were obtained without intravenous contrast. Angiographic images of the head were obtained using MRA technique without contrast. COMPARISON:  Prior CT from earlier the same day as well as previous MRI from 02/23/2018. FINDINGS: MRI HEAD FINDINGS Brain: Generalized age-related cerebral atrophy with chronic microvascular ischemic disease again noted, stable. There has been interval evolution of a large left PCA territory infarct, late subacute to  chronic in appearance. Overall, size of left PCA territory infarct is increased relative to previous MRI, but is chronic in appearance. Developing encephalomalacia with laminar necrosis seen within the area of infarction. No evidence for hemorrhagic transformation. No significant mass effect. 8 mm acute ischemic cortical infarct seen at the posterior right temporal lobe (series 100, image 28). No associated hemorrhage or mass effect. No other evidence for acute or interval infarct. No evidence for acute or chronic intracranial hemorrhage. No mass lesion, midline shift or mass effect. No hydrocephalus. No extra-axial fluid collection. Pituitary gland within normal  limits. Vascular: Major intracranial vascular flow voids are maintained. Skull and upper cervical spine: Craniocervical junction normal. Bone marrow signal intensity within normal limits. No scalp soft tissue abnormality. Sinuses/Orbits: Globes and orbital soft tissues within normal limits. Patient status post ocular lens replacement bilaterally. Small maxillary sinus retention cysts noted paranasal sinuses are otherwise clear. No mastoid effusion. Inner ear structures normal. Other: None. MRA HEAD FINDINGS ANTERIOR CIRCULATION: Distal cervical segments of the internal carotid arteries are patent with antegrade flow. Petrous segments patent bilaterally. Scattered atheromatous irregularity within the cavernous/supraclinoid ICAs without hemodynamically significant stenosis. A1 segments patent bilaterally. Left A1 hypoplastic, accounting for the diminutive left ICA is compared to the right. Normal anterior communicating artery. Anterior cerebral arteries patent to their distal aspects without stenosis M1 segments patent without stenosis. Normal MCA bifurcations. Distal MCA branches well perfused and symmetric POSTERIOR CIRCULATION: Vertebral arteries patent to the vertebrobasilar junction without stenosis. Patent right PICA. Left PICA not visualized. Dominant  left AICA. Basilar artery widely patent to its distal aspect. basilar patent to its distal aspect without stenosis. Superior cerebral arteries patent bilaterally. Both of the posterior cerebral artery supplied via the basilar. Chronic occlusion of the proximal left PCA with minimal thready flow distally. Right PCA widely patent to its distal aspect without proximal high-grade or correctable stenosis. IMPRESSION: MRI HEAD IMPRESSION: 1. 8 mm acute ischemic nonhemorrhagic right temporal cortical infarct. 2. Interval evolution of large left PCA territory infarct, now late subacute to chronic in appearance. 3. Stable age-related cerebral atrophy with mild chronic small vessel ischemic disease. MRA HEAD IMPRESSION: 1. Chronic proximal left P2 occlusion. 2. Otherwise negative intracranial MRA with relatively minor atherosclerotic change for age. No other proximal high-grade or correctable stenosis identified. Electronically Signed   By: Jeannine Boga M.D.   On: 04/16/2018 01:12   Mr Jodene Nam Head/brain XH Cm  Result Date: 04/16/2018 CLINICAL DATA:  Initial evaluation for increased confusion, worsened right arm weakness. Recent stroke. EXAM: MRI HEAD WITHOUT CONTRAST MRA HEAD WITHOUT CONTRAST TECHNIQUE: Multiplanar, multiecho pulse sequences of the brain and surrounding structures were obtained without intravenous contrast. Angiographic images of the head were obtained using MRA technique without contrast. COMPARISON:  Prior CT from earlier the same day as well as previous MRI from 02/23/2018. FINDINGS: MRI HEAD FINDINGS Brain: Generalized age-related cerebral atrophy with chronic microvascular ischemic disease again noted, stable. There has been interval evolution of a large left PCA territory infarct, late subacute to chronic in appearance. Overall, size of left PCA territory infarct is increased relative to previous MRI, but is chronic in appearance. Developing encephalomalacia with laminar necrosis seen within the  area of infarction. No evidence for hemorrhagic transformation. No significant mass effect. 8 mm acute ischemic cortical infarct seen at the posterior right temporal lobe (series 100, image 28). No associated hemorrhage or mass effect. No other evidence for acute or interval infarct. No evidence for acute or chronic intracranial hemorrhage. No mass lesion, midline shift or mass effect. No hydrocephalus. No extra-axial fluid collection. Pituitary gland within normal limits. Vascular: Major intracranial vascular flow voids are maintained. Skull and upper cervical spine: Craniocervical junction normal. Bone marrow signal intensity within normal limits. No scalp soft tissue abnormality. Sinuses/Orbits: Globes and orbital soft tissues within normal limits. Patient status post ocular lens replacement bilaterally. Small maxillary sinus retention cysts noted paranasal sinuses are otherwise clear. No mastoid effusion. Inner ear structures normal. Other: None. MRA HEAD FINDINGS ANTERIOR CIRCULATION: Distal cervical segments of the internal carotid arteries are patent with antegrade  flow. Petrous segments patent bilaterally. Scattered atheromatous irregularity within the cavernous/supraclinoid ICAs without hemodynamically significant stenosis. A1 segments patent bilaterally. Left A1 hypoplastic, accounting for the diminutive left ICA is compared to the right. Normal anterior communicating artery. Anterior cerebral arteries patent to their distal aspects without stenosis M1 segments patent without stenosis. Normal MCA bifurcations. Distal MCA branches well perfused and symmetric POSTERIOR CIRCULATION: Vertebral arteries patent to the vertebrobasilar junction without stenosis. Patent right PICA. Left PICA not visualized. Dominant left AICA. Basilar artery widely patent to its distal aspect. basilar patent to its distal aspect without stenosis. Superior cerebral arteries patent bilaterally. Both of the posterior cerebral artery  supplied via the basilar. Chronic occlusion of the proximal left PCA with minimal thready flow distally. Right PCA widely patent to its distal aspect without proximal high-grade or correctable stenosis. IMPRESSION: MRI HEAD IMPRESSION: 1. 8 mm acute ischemic nonhemorrhagic right temporal cortical infarct. 2. Interval evolution of large left PCA territory infarct, now late subacute to chronic in appearance. 3. Stable age-related cerebral atrophy with mild chronic small vessel ischemic disease. MRA HEAD IMPRESSION: 1. Chronic proximal left P2 occlusion. 2. Otherwise negative intracranial MRA with relatively minor atherosclerotic change for age. No other proximal high-grade or correctable stenosis identified. Electronically Signed   By: Jeannine Boga M.D.   On: 04/16/2018 01:12       Assessment & Plan:   Problem List Items Addressed This Visit    Acute ischemic left PCA stroke [facility])    Recent admission.  Readmitted with concerns regarding recurring stroke.  Doing better.  At home.  Working with therapy.  Follow.        Ascending aortic aneurysm ([place])    Worked up by vascular surgery.  Continue f/u with vascular surgery.       CAD (coronary artery disease)    Followed by cardiology.  Continue risk factor modification.        CVA (cerebral vascular accident) [facility])    Recently readmitted with recurring CVA.  Receiving home therapy.  Remains on eliquis.        Diabetes mellitus type 2 in nonobese [facility])    Given a glucometer.  Instructed wife on how to check sugars.  Follow met b and a1c.       Hypercholesterolemia    On lipitor.  Low cholesterol diet and exercise.  Follow lipid panel and liver function tests.        Hypertension    Blood pressure under good control.  Continue same medication regimen.  Follow pressures.  Follow metabolic panel.        PAF (paroxysmal atrial fibrillation) ([place])    On eliquis.  Followed by cardiology.           Einar Pheasant, MD

## 2018-05-10 ENCOUNTER — Other Ambulatory Visit: Payer: Self-pay

## 2018-05-10 NOTE — Patient Outreach (Signed)
[facility]) Care Management  05/10/2018  Kyle Richardson 1928/08/20 [id number]  Care Coordination/Telephone assessment: Successful telephone encounter to the above patients wife, [name] for 2 week follow-up to initial home visit and to schedule 30 day home visit. Wife states patient is doing well. Continues to have round the clock paid CNA care. Denies recent falls. States patient will have carotid surgery procedure 05/22/17. Denies overnight hospital stay post procedure. HH PT continues to work with patient. States all medications in home and administered by wife as prescribed.  Monthly follow up home visit scheduled.  Plan: Routine home visit as scheduled in 3 weeks.  Larinda Herter E. Rollene Rotunda RN, BSN [facility] Care Management Coordinator [phone]

## 2018-05-12 ENCOUNTER — Encounter: Payer: Self-pay | Admitting: Internal Medicine

## 2018-05-12 NOTE — Assessment & Plan Note (Signed)
Worked up by vascular surgery.  Continue f/u with vascular surgery.

## 2018-05-12 NOTE — Assessment & Plan Note (Signed)
Recent admission.  Readmitted with concerns regarding recurring stroke.  Doing better.  At home.  Working with therapy.  Follow.

## 2018-05-12 NOTE — Assessment & Plan Note (Signed)
On eliquis.  Followed by cardiology.   

## 2018-05-12 NOTE — Assessment & Plan Note (Signed)
Followed by cardiology.  Continue risk factor modification.   

## 2018-05-12 NOTE — Assessment & Plan Note (Signed)
Given a glucometer.  Instructed wife on how to check sugars.  Follow met b and a1c.

## 2018-05-12 NOTE — Assessment & Plan Note (Signed)
Recently readmitted with recurring CVA.  Receiving home therapy.  Remains on eliquis.

## 2018-05-12 NOTE — Assessment & Plan Note (Signed)
On lipitor.  Low cholesterol diet and exercise.  Follow lipid panel and liver function tests.   

## 2018-05-12 NOTE — Assessment & Plan Note (Signed)
Blood pressure under good control.  Continue same medication regimen.  Follow pressures.  Follow metabolic panel.   

## 2018-05-13 DIAGNOSIS — G4733 Obstructive sleep apnea (adult) (pediatric): Secondary | ICD-10-CM | POA: Diagnosis not present

## 2018-05-13 DIAGNOSIS — I69351 Hemiplegia and hemiparesis following cerebral infarction affecting right dominant side: Secondary | ICD-10-CM | POA: Diagnosis not present

## 2018-05-13 DIAGNOSIS — I69398 Other sequelae of cerebral infarction: Secondary | ICD-10-CM | POA: Diagnosis not present

## 2018-05-13 DIAGNOSIS — H532 Diplopia: Secondary | ICD-10-CM | POA: Diagnosis not present

## 2018-05-13 DIAGNOSIS — H409 Unspecified glaucoma: Secondary | ICD-10-CM | POA: Diagnosis not present

## 2018-05-13 DIAGNOSIS — E119 Type 2 diabetes mellitus without complications: Secondary | ICD-10-CM | POA: Diagnosis not present

## 2018-05-13 DIAGNOSIS — I69320 Aphasia following cerebral infarction: Secondary | ICD-10-CM | POA: Diagnosis not present

## 2018-05-14 DIAGNOSIS — H53469 Homonymous bilateral field defects, unspecified side: Secondary | ICD-10-CM | POA: Diagnosis not present

## 2018-05-14 DIAGNOSIS — Z8673 Personal history of transient ischemic attack (TIA), and cerebral infarction without residual deficits: Secondary | ICD-10-CM | POA: Diagnosis not present

## 2018-05-14 DIAGNOSIS — I69398 Other sequelae of cerebral infarction: Secondary | ICD-10-CM | POA: Diagnosis not present

## 2018-05-14 DIAGNOSIS — I69359 Hemiplegia and hemiparesis following cerebral infarction affecting unspecified side: Secondary | ICD-10-CM | POA: Diagnosis not present

## 2018-05-16 ENCOUNTER — Other Ambulatory Visit: Payer: Self-pay | Admitting: *Deleted

## 2018-05-17 NOTE — Patient Outreach (Signed)
[facility]) Care Management  05/17/2018  Kyle Richardson Jan 19, 1928 [id number]  Late Entry-This social worker received  Referral from the [facility] to provide community resources for in home care for patient.  Resources provided to patient's daughter Kyle Richardson who confirmed that [facility] completed their assessment on 05/16/09 and they will provide care.  Phone call to patient's spouse Kyle Richardson who confirms the above as well stating that they should start care within the next couple of weeks.  Patient's spouse verbalized having no additional community resource needs at this time. Patient's spouse/family members encouraged to call this social worker if there are any questions or concerns in the future.   Kyle Richardson [facility] Care Management [phone]

## 2018-05-20 ENCOUNTER — Other Ambulatory Visit (INDEPENDENT_AMBULATORY_CARE_PROVIDER_SITE_OTHER): Payer: Self-pay | Admitting: Vascular Surgery

## 2018-05-20 ENCOUNTER — Telehealth: Payer: Self-pay | Admitting: Internal Medicine

## 2018-05-20 NOTE — Telephone Encounter (Signed)
Copied from [place] [id number]. Topic: Inquiry >> May 20, 2018  2:55 PM Scherrie Gerlach wrote: Reason for CRM: Janann Colonel with [facility] calling for verbal orders for ST Would like verbal to move pt's 2nd visit of the week to next week due to pt having surgery this week. (She has been seeing him 2 X wk)  Ok to leave VM

## 2018-05-21 ENCOUNTER — Encounter
Admission: RE | Admit: 2018-05-21 | Discharge: 2018-05-21 | Disposition: A | Discharge status: Home / Self Care | Payer: PPO | Source: Ambulatory Visit | Attending: Vascular Surgery | Admitting: Vascular Surgery

## 2018-05-21 ENCOUNTER — Other Ambulatory Visit: Payer: Self-pay

## 2018-05-21 DIAGNOSIS — Z9861 Coronary angioplasty status: Secondary | ICD-10-CM | POA: Diagnosis not present

## 2018-05-21 DIAGNOSIS — Z7901 Long term (current) use of anticoagulants: Secondary | ICD-10-CM | POA: Diagnosis not present

## 2018-05-21 DIAGNOSIS — I69320 Aphasia following cerebral infarction: Secondary | ICD-10-CM | POA: Diagnosis not present

## 2018-05-21 DIAGNOSIS — I10 Essential (primary) hypertension: Secondary | ICD-10-CM | POA: Diagnosis present

## 2018-05-21 DIAGNOSIS — Z7401 Bed confinement status: Secondary | ICD-10-CM | POA: Diagnosis not present

## 2018-05-21 DIAGNOSIS — I69351 Hemiplegia and hemiparesis following cerebral infarction affecting right dominant side: Secondary | ICD-10-CM | POA: Diagnosis not present

## 2018-05-21 DIAGNOSIS — Z951 Presence of aortocoronary bypass graft: Secondary | ICD-10-CM | POA: Diagnosis not present

## 2018-05-21 DIAGNOSIS — Z7982 Long term (current) use of aspirin: Secondary | ICD-10-CM | POA: Diagnosis not present

## 2018-05-21 DIAGNOSIS — I480 Paroxysmal atrial fibrillation: Secondary | ICD-10-CM | POA: Diagnosis not present

## 2018-05-21 DIAGNOSIS — R451 Restlessness and agitation: Secondary | ICD-10-CM | POA: Diagnosis present

## 2018-05-21 DIAGNOSIS — I252 Old myocardial infarction: Secondary | ICD-10-CM | POA: Diagnosis not present

## 2018-05-21 DIAGNOSIS — I4891 Unspecified atrial fibrillation: Secondary | ICD-10-CM | POA: Diagnosis present

## 2018-05-21 DIAGNOSIS — R410 Disorientation, unspecified: Secondary | ICD-10-CM | POA: Diagnosis not present

## 2018-05-21 DIAGNOSIS — I639 Cerebral infarction, unspecified: Secondary | ICD-10-CM | POA: Diagnosis not present

## 2018-05-21 DIAGNOSIS — I69391 Dysphagia following cerebral infarction: Secondary | ICD-10-CM | POA: Diagnosis not present

## 2018-05-21 DIAGNOSIS — G4733 Obstructive sleep apnea (adult) (pediatric): Secondary | ICD-10-CM | POA: Diagnosis not present

## 2018-05-21 DIAGNOSIS — R4701 Aphasia: Secondary | ICD-10-CM | POA: Diagnosis not present

## 2018-05-21 DIAGNOSIS — H409 Unspecified glaucoma: Secondary | ICD-10-CM | POA: Diagnosis present

## 2018-05-21 DIAGNOSIS — G9340 Encephalopathy, unspecified: Secondary | ICD-10-CM | POA: Diagnosis not present

## 2018-05-21 DIAGNOSIS — I6523 Occlusion and stenosis of bilateral carotid arteries: Secondary | ICD-10-CM | POA: Diagnosis present

## 2018-05-21 DIAGNOSIS — Z8249 Family history of ischemic heart disease and other diseases of the circulatory system: Secondary | ICD-10-CM | POA: Diagnosis not present

## 2018-05-21 DIAGNOSIS — I63239 Cerebral infarction due to unspecified occlusion or stenosis of unspecified carotid arteries: Secondary | ICD-10-CM | POA: Diagnosis not present

## 2018-05-21 DIAGNOSIS — G4730 Sleep apnea, unspecified: Secondary | ICD-10-CM | POA: Diagnosis present

## 2018-05-21 DIAGNOSIS — E785 Hyperlipidemia, unspecified: Secondary | ICD-10-CM | POA: Diagnosis present

## 2018-05-21 DIAGNOSIS — R4182 Altered mental status, unspecified: Secondary | ICD-10-CM | POA: Diagnosis not present

## 2018-05-21 DIAGNOSIS — M1990 Unspecified osteoarthritis, unspecified site: Secondary | ICD-10-CM | POA: Diagnosis not present

## 2018-05-21 DIAGNOSIS — M25551 Pain in right hip: Secondary | ICD-10-CM | POA: Diagnosis not present

## 2018-05-21 DIAGNOSIS — E119 Type 2 diabetes mellitus without complications: Secondary | ICD-10-CM | POA: Diagnosis present

## 2018-05-21 DIAGNOSIS — I6521 Occlusion and stenosis of right carotid artery: Secondary | ICD-10-CM | POA: Diagnosis not present

## 2018-05-21 DIAGNOSIS — I69822 Dysarthria following other cerebrovascular disease: Secondary | ICD-10-CM | POA: Diagnosis not present

## 2018-05-21 DIAGNOSIS — M7541 Impingement syndrome of right shoulder: Secondary | ICD-10-CM | POA: Diagnosis not present

## 2018-05-21 DIAGNOSIS — H532 Diplopia: Secondary | ICD-10-CM | POA: Diagnosis not present

## 2018-05-21 DIAGNOSIS — M5136 Other intervertebral disc degeneration, lumbar region: Secondary | ICD-10-CM | POA: Diagnosis not present

## 2018-05-21 DIAGNOSIS — I2510 Atherosclerotic heart disease of native coronary artery without angina pectoris: Secondary | ICD-10-CM | POA: Diagnosis present

## 2018-05-21 DIAGNOSIS — E7800 Pure hypercholesterolemia, unspecified: Secondary | ICD-10-CM | POA: Diagnosis present

## 2018-05-21 DIAGNOSIS — G43909 Migraine, unspecified, not intractable, without status migrainosus: Secondary | ICD-10-CM | POA: Diagnosis not present

## 2018-05-21 DIAGNOSIS — R1311 Dysphagia, oral phase: Secondary | ICD-10-CM | POA: Diagnosis not present

## 2018-05-21 DIAGNOSIS — M25511 Pain in right shoulder: Secondary | ICD-10-CM | POA: Diagnosis present

## 2018-05-21 DIAGNOSIS — M6281 Muscle weakness (generalized): Secondary | ICD-10-CM | POA: Diagnosis not present

## 2018-05-21 DIAGNOSIS — R41841 Cognitive communication deficit: Secondary | ICD-10-CM | POA: Diagnosis not present

## 2018-05-21 DIAGNOSIS — I69398 Other sequelae of cerebral infarction: Secondary | ICD-10-CM | POA: Diagnosis not present

## 2018-05-21 DIAGNOSIS — Z955 Presence of coronary angioplasty implant and graft: Secondary | ICD-10-CM | POA: Diagnosis not present

## 2018-05-21 DIAGNOSIS — Z7951 Long term (current) use of inhaled steroids: Secondary | ICD-10-CM | POA: Diagnosis not present

## 2018-05-21 HISTORY — DX: Other complications of anesthesia, initial encounter: T88.59XA

## 2018-05-21 HISTORY — DX: Acute myocardial infarction, unspecified: I21.9

## 2018-05-21 HISTORY — DX: Adverse effect of unspecified anesthetic, initial encounter: T41.45XA

## 2018-05-21 LAB — CREATININE, SERUM
CREATININE: 0.8 mg/dL (ref 0.61–1.24)
GFR calc Af Amer: >60 mL/min (ref >60)

## 2018-05-21 LAB — BUN: BUN: 13 mg/dL (ref 8–23)

## 2018-05-21 MED ORDER — CEFAZOLIN SODIUM-DEXTROSE 2-4 GM/100ML-% IV SOLN
2.0000 g | Freq: Once | INTRAVENOUS | Status: AC
  Administered 2018-05-22: 2 g via INTRAVENOUS

## 2018-05-21 NOTE — Telephone Encounter (Signed)
Left message for PT Janann Colonel to return call to office [facility] nurse may give verbal for PT.

## 2018-05-21 NOTE — Patient Instructions (Signed)
FOLLOW INSTRUCTIONS GIVEN TO YOU BY  VEIN AND VASCULAR.  Your procedure is scheduled with Dr. Delana Meyer                           On:  Wednesday, May 22, 2018    Go to '[facility]' on the first floor of the [facility].  Do not eat or drink 8 hours prior to your procedure.    Please call Dr Nino Parsley office with any questions or concerns: [phone].  You will need to have someone drive you home and stay with you the night of the procedure.

## 2018-05-21 NOTE — Telephone Encounter (Signed)
Verbal order given to [facility].

## 2018-05-22 ENCOUNTER — Encounter
Admission: RE | Disposition: A | Discharge status: Skilled Nursing Facility | Payer: Self-pay | Source: Home / Self Care | Attending: Vascular Surgery

## 2018-05-22 ENCOUNTER — Inpatient Hospital Stay
Admission: RE | Admit: 2018-05-22 | Discharge: 2018-05-31 | DRG: 035 | Disposition: A | Discharge status: Skilled Nursing Facility | Payer: PPO | Attending: Vascular Surgery | Admitting: Vascular Surgery

## 2018-05-22 DIAGNOSIS — E119 Type 2 diabetes mellitus without complications: Secondary | ICD-10-CM | POA: Diagnosis present

## 2018-05-22 DIAGNOSIS — E7800 Pure hypercholesterolemia, unspecified: Secondary | ICD-10-CM | POA: Diagnosis present

## 2018-05-22 DIAGNOSIS — Z8249 Family history of ischemic heart disease and other diseases of the circulatory system: Secondary | ICD-10-CM

## 2018-05-22 DIAGNOSIS — Z955 Presence of coronary angioplasty implant and graft: Secondary | ICD-10-CM | POA: Diagnosis not present

## 2018-05-22 DIAGNOSIS — E785 Hyperlipidemia, unspecified: Secondary | ICD-10-CM | POA: Diagnosis present

## 2018-05-22 DIAGNOSIS — G9340 Encephalopathy, unspecified: Secondary | ICD-10-CM

## 2018-05-22 DIAGNOSIS — I6523 Occlusion and stenosis of bilateral carotid arteries: Secondary | ICD-10-CM | POA: Diagnosis present

## 2018-05-22 DIAGNOSIS — Z7982 Long term (current) use of aspirin: Secondary | ICD-10-CM | POA: Diagnosis not present

## 2018-05-22 DIAGNOSIS — I63239 Cerebral infarction due to unspecified occlusion or stenosis of unspecified carotid arteries: Secondary | ICD-10-CM | POA: Diagnosis not present

## 2018-05-22 DIAGNOSIS — I480 Paroxysmal atrial fibrillation: Secondary | ICD-10-CM | POA: Diagnosis not present

## 2018-05-22 DIAGNOSIS — M25511 Pain in right shoulder: Secondary | ICD-10-CM | POA: Diagnosis present

## 2018-05-22 DIAGNOSIS — R451 Restlessness and agitation: Secondary | ICD-10-CM | POA: Diagnosis present

## 2018-05-22 DIAGNOSIS — R410 Disorientation, unspecified: Secondary | ICD-10-CM | POA: Diagnosis not present

## 2018-05-22 DIAGNOSIS — G4730 Sleep apnea, unspecified: Secondary | ICD-10-CM | POA: Diagnosis present

## 2018-05-22 DIAGNOSIS — I4891 Unspecified atrial fibrillation: Secondary | ICD-10-CM | POA: Diagnosis present

## 2018-05-22 DIAGNOSIS — G9341 Metabolic encephalopathy: Secondary | ICD-10-CM

## 2018-05-22 DIAGNOSIS — I69351 Hemiplegia and hemiparesis following cerebral infarction affecting right dominant side: Secondary | ICD-10-CM | POA: Diagnosis not present

## 2018-05-22 DIAGNOSIS — Z7951 Long term (current) use of inhaled steroids: Secondary | ICD-10-CM

## 2018-05-22 DIAGNOSIS — I252 Old myocardial infarction: Secondary | ICD-10-CM | POA: Diagnosis not present

## 2018-05-22 DIAGNOSIS — I2510 Atherosclerotic heart disease of native coronary artery without angina pectoris: Secondary | ICD-10-CM | POA: Diagnosis present

## 2018-05-22 DIAGNOSIS — Z951 Presence of aortocoronary bypass graft: Secondary | ICD-10-CM | POA: Diagnosis not present

## 2018-05-22 DIAGNOSIS — T148XXA Other injury of unspecified body region, initial encounter: Secondary | ICD-10-CM

## 2018-05-22 DIAGNOSIS — R4701 Aphasia: Secondary | ICD-10-CM | POA: Diagnosis not present

## 2018-05-22 DIAGNOSIS — I10 Essential (primary) hypertension: Secondary | ICD-10-CM | POA: Diagnosis present

## 2018-05-22 DIAGNOSIS — H409 Unspecified glaucoma: Secondary | ICD-10-CM | POA: Diagnosis present

## 2018-05-22 DIAGNOSIS — Z7901 Long term (current) use of anticoagulants: Secondary | ICD-10-CM | POA: Diagnosis not present

## 2018-05-22 DIAGNOSIS — I6521 Occlusion and stenosis of right carotid artery: Secondary | ICD-10-CM | POA: Diagnosis not present

## 2018-05-22 DIAGNOSIS — R4182 Altered mental status, unspecified: Secondary | ICD-10-CM | POA: Diagnosis not present

## 2018-05-22 HISTORY — PX: CAROTID PTA/STENT INTERVENTION: CATH118231

## 2018-05-22 LAB — HEPARIN LEVEL (UNFRACTIONATED): Heparin Unfractionated: <0.1 IU/mL — ABNORMAL LOW (ref 0.30–0.70)

## 2018-05-22 LAB — TROPONIN I

## 2018-05-22 LAB — GLUCOSE, CAPILLARY
GLUCOSE-CAPILLARY: 147 mg/dL — AB (ref 70–99)
GLUCOSE-CAPILLARY: 126 mg/dL — AB (ref 70–99)
GLUCOSE-CAPILLARY: 152 mg/dL — AB (ref 70–99)
GLUCOSE-CAPILLARY: 134 mg/dL — AB (ref 70–99)
Glucose-Capillary: 119 mg/dL — ABNORMAL HIGH (ref 70–99)
Glucose-Capillary: 160 mg/dL — ABNORMAL HIGH (ref 70–99)

## 2018-05-22 LAB — MRSA PCR SCREENING: MRSA BY PCR: NEGATIVE

## 2018-05-22 LAB — APTT: aPTT: 29 seconds (ref 24–36)

## 2018-05-22 SURGERY — CAROTID PTA/STENT INTERVENTION
Anesthesia: Moderate Sedation | Laterality: Right

## 2018-05-22 MED ORDER — FLUOXETINE HCL 10 MG PO CAPS
10.0000 mg | ORAL_CAPSULE | Freq: Every day | ORAL | Status: DC
  Administered 2018-05-23 – 2018-05-31 (×9): 10 mg via ORAL
  Filled 2018-05-22 (×9): qty 1

## 2018-05-22 MED ORDER — METOPROLOL SUCCINATE ER 25 MG PO TB24
12.5000 mg | ORAL_TABLET | Freq: Every day | ORAL | Status: DC
  Administered 2018-05-26 – 2018-05-31 (×5): 12.5 mg via ORAL
  Filled 2018-05-22: qty 1
  Filled 2018-05-22: qty 0.5
  Filled 2018-05-22: qty 1
  Filled 2018-05-22 (×2): qty 0.5
  Filled 2018-05-22 (×4): qty 1

## 2018-05-22 MED ORDER — PHENOL 1.4 % MT LIQD
1.0000 | OROMUCOSAL | Status: DC | PRN
  Filled 2018-05-22: qty 177

## 2018-05-22 MED ORDER — HEPARIN SODIUM (PORCINE) 1000 UNIT/ML IJ SOLN
INTRAMUSCULAR | Status: AC
  Filled 2018-05-22: qty 1

## 2018-05-22 MED ORDER — FENTANYL CITRATE (PF) 100 MCG/2ML IJ SOLN
INTRAMUSCULAR | Status: DC | PRN
  Administered 2018-05-22: 75 ug via INTRAVENOUS
  Administered 2018-05-22: 50 ug via INTRAVENOUS

## 2018-05-22 MED ORDER — METOPROLOL TARTRATE 5 MG/5ML IV SOLN: 2.0000 mg | INTRAVENOUS | Status: DC | PRN

## 2018-05-22 MED ORDER — ATROPINE SULFATE 1 MG/10ML IJ SOSY
PREFILLED_SYRINGE | INTRAMUSCULAR | Status: AC
  Filled 2018-05-22: qty 10

## 2018-05-22 MED ORDER — LATANOPROST 0.005 % OP SOLN
1.0000 [drp] | Freq: Every day | OPHTHALMIC | Status: DC
  Administered 2018-05-22 – 2018-05-30 (×9): 1 [drp] via OPHTHALMIC
  Filled 2018-05-22 (×2): qty 2.5

## 2018-05-22 MED ORDER — SODIUM CHLORIDE 0.9 % IV SOLN
INTRAVENOUS | Status: DC
  Administered 2018-05-22: 08:00:00 via INTRAVENOUS

## 2018-05-22 MED ORDER — VALPROATE SODIUM 500 MG/5ML IV SOLN
250.0000 mg | Freq: Two times a day (BID) | INTRAVENOUS | Status: DC
  Administered 2018-05-22 – 2018-05-28 (×12): 250 mg via INTRAVENOUS
  Filled 2018-05-22 (×14): qty 2.5

## 2018-05-22 MED ORDER — DOPAMINE-DEXTROSE 3.2-5 MG/ML-% IV SOLN
0.0000 ug/kg/min | INTRAVENOUS | Status: DC
Start: 2018-05-22 — End: 2018-05-25
  Administered 2018-05-22: 5 ug/kg/min via INTRAVENOUS

## 2018-05-22 MED ORDER — CLOPIDOGREL BISULFATE 300 MG PO TABS
300.0000 mg | ORAL_TABLET | ORAL | Status: AC
  Administered 2018-05-22: 300 mg via ORAL
  Filled 2018-05-22: qty 1

## 2018-05-22 MED ORDER — DOPAMINE-DEXTROSE 3.2-5 MG/ML-% IV SOLN
INTRAVENOUS | Status: AC
  Filled 2018-05-22: qty 250

## 2018-05-22 MED ORDER — DIPHENHYDRAMINE HCL 50 MG/ML IJ SOLN
INTRAMUSCULAR | Status: AC
  Filled 2018-05-22: qty 1

## 2018-05-22 MED ORDER — CLOPIDOGREL BISULFATE 75 MG PO TABS: 75.0000 mg | ORAL_TABLET | Freq: Every day | ORAL | Status: DC

## 2018-05-22 MED ORDER — DIVALPROEX SODIUM ER 500 MG PO TB24
500.0000 mg | ORAL_TABLET | Freq: Every day | ORAL | Status: DC
  Filled 2018-05-22: qty 1

## 2018-05-22 MED ORDER — PROMETHAZINE HCL 25 MG/ML IJ SOLN
12.5000 mg | Freq: Once | INTRAMUSCULAR | Status: DC
  Filled 2018-05-22: qty 1

## 2018-05-22 MED ORDER — CEFAZOLIN SODIUM-DEXTROSE 2-4 GM/100ML-% IV SOLN
2.0000 g | Freq: Three times a day (TID) | INTRAVENOUS | Status: AC
  Administered 2018-05-22 (×2): 2 g via INTRAVENOUS
  Filled 2018-05-22 (×2): qty 100

## 2018-05-22 MED ORDER — LIDOCAINE HCL (PF) 1 % IJ SOLN
INTRAMUSCULAR | Status: AC
  Filled 2018-05-22: qty 30

## 2018-05-22 MED ORDER — ONDANSETRON HCL 4 MG/2ML IJ SOLN
4.0000 mg | Freq: Four times a day (QID) | INTRAMUSCULAR | Status: DC | PRN
  Administered 2018-05-22: 4 mg via INTRAVENOUS
  Filled 2018-05-22: qty 2

## 2018-05-22 MED ORDER — ATORVASTATIN CALCIUM 20 MG PO TABS
40.0000 mg | ORAL_TABLET | Freq: Every day | ORAL | Status: DC
  Administered 2018-05-25 – 2018-05-30 (×6): 40 mg via ORAL
  Filled 2018-05-22 (×7): qty 2

## 2018-05-22 MED ORDER — ACETAMINOPHEN 325 MG PO TABS: 325.0000 mg | ORAL_TABLET | ORAL | Status: DC | PRN

## 2018-05-22 MED ORDER — DOCUSATE SODIUM 100 MG PO CAPS
100.0000 mg | ORAL_CAPSULE | Freq: Every day | ORAL | Status: DC
  Administered 2018-05-23 – 2018-05-31 (×7): 100 mg via ORAL
  Filled 2018-05-22 (×9): qty 1

## 2018-05-22 MED ORDER — IOPAMIDOL (ISOVUE-300) INJECTION 61%
INTRAVENOUS | Status: DC | PRN
  Administered 2018-05-22: 40 mL via INTRA_ARTERIAL

## 2018-05-22 MED ORDER — DIPHENHYDRAMINE HCL 50 MG/ML IJ SOLN
INTRAMUSCULAR | Status: DC | PRN
  Administered 2018-05-22: 25 mg via INTRAVENOUS

## 2018-05-22 MED ORDER — ASPIRIN EC 81 MG PO TBEC
162.0000 mg | DELAYED_RELEASE_TABLET | Freq: Once | ORAL | Status: DC
  Filled 2018-05-22: qty 2

## 2018-05-22 MED ORDER — DIAZEPAM 5 MG/ML IJ SOLN
2.5000 mg | Freq: Four times a day (QID) | INTRAMUSCULAR | Status: DC | PRN
  Administered 2018-05-22 – 2018-05-28 (×8): 2.5 mg via INTRAVENOUS
  Filled 2018-05-22 (×9): qty 2

## 2018-05-22 MED ORDER — KCL IN DEXTROSE-NACL 20-5-0.9 MEQ/L-%-% IV SOLN
INTRAVENOUS | Status: DC
  Administered 2018-05-22 – 2018-05-25 (×6): via INTRAVENOUS
  Filled 2018-05-22 (×12): qty 1000

## 2018-05-22 MED ORDER — FENTANYL CITRATE (PF) 100 MCG/2ML IJ SOLN
INTRAMUSCULAR | Status: AC
  Filled 2018-05-22: qty 2

## 2018-05-22 MED ORDER — ACETAMINOPHEN 650 MG RE SUPP: 325.0000 mg | RECTAL | Status: DC | PRN

## 2018-05-22 MED ORDER — DIAZEPAM 5 MG/ML IJ SOLN
2.5000 mg | Freq: Once | INTRAMUSCULAR | Status: AC
  Administered 2018-05-22: 2.5 mg via INTRAVENOUS
  Filled 2018-05-22: qty 2

## 2018-05-22 MED ORDER — FLUTICASONE PROPIONATE 50 MCG/ACT NA SUSP
2.0000 | Freq: Every day | NASAL | Status: DC
  Administered 2018-05-23 – 2018-05-31 (×9): 2 via NASAL
  Filled 2018-05-22: qty 16

## 2018-05-22 MED ORDER — ATROPINE SULFATE 0.4 MG/ML IJ SOLN
INTRAMUSCULAR | Status: DC | PRN
  Administered 2018-05-22 (×2): 0.4 mg via INTRAVENOUS

## 2018-05-22 MED ORDER — HEPARIN (PORCINE) IN NACL 100-0.45 UNIT/ML-% IJ SOLN
950.0000 [IU]/h | INTRAMUSCULAR | Status: DC
  Administered 2018-05-22: 950 [IU]/h via INTRAVENOUS
  Filled 2018-05-22: qty 250

## 2018-05-22 MED ORDER — ASPIRIN EC 81 MG PO TBEC: 81.0000 mg | DELAYED_RELEASE_TABLET | Freq: Every day | ORAL | Status: DC

## 2018-05-22 MED ORDER — METHYLPREDNISOLONE SODIUM SUCC 125 MG IJ SOLR: 125.0000 mg | INTRAMUSCULAR | Status: DC | PRN

## 2018-05-22 MED ORDER — HYDROMORPHONE HCL 1 MG/ML IJ SOLN: 1.0000 mg | Freq: Once | INTRAMUSCULAR | Status: DC | PRN

## 2018-05-22 MED ORDER — ALUM & MAG HYDROXIDE-SIMETH 200-200-20 MG/5ML PO SUSP
15.0000 mL | ORAL | Status: DC | PRN
  Administered 2018-05-24: 30 mL via ORAL
  Filled 2018-05-22 (×4): qty 30

## 2018-05-22 MED ORDER — HYDRALAZINE HCL 20 MG/ML IJ SOLN: 5.0000 mg | INTRAMUSCULAR | Status: DC | PRN

## 2018-05-22 MED ORDER — HEPARIN (PORCINE) IN NACL 1000-0.9 UT/500ML-% IV SOLN
INTRAVENOUS | Status: AC
  Filled 2018-05-22: qty 1000

## 2018-05-22 MED ORDER — ASPIRIN EC 81 MG PO TBEC
81.0000 mg | DELAYED_RELEASE_TABLET | Freq: Every day | ORAL | Status: DC
  Administered 2018-05-23: 81 mg via ORAL
  Filled 2018-05-22: qty 1

## 2018-05-22 MED ORDER — HEPARIN SODIUM (PORCINE) 1000 UNIT/ML IJ SOLN
INTRAMUSCULAR | Status: DC | PRN
  Administered 2018-05-22: 7000 [IU] via INTRAVENOUS
  Administered 2018-05-22: 2000 [IU] via INTRAVENOUS

## 2018-05-22 MED ORDER — GUAIFENESIN-DM 100-10 MG/5ML PO SYRP
15.0000 mL | ORAL_SOLUTION | ORAL | Status: DC | PRN
  Filled 2018-05-22: qty 15

## 2018-05-22 MED ORDER — CLOPIDOGREL BISULFATE 75 MG PO TABS
ORAL_TABLET | ORAL | Status: AC
  Filled 2018-05-22: qty 4

## 2018-05-22 MED ORDER — LORATADINE 10 MG PO TABS
10.0000 mg | ORAL_TABLET | Freq: Every day | ORAL | Status: DC
  Administered 2018-05-23 – 2018-05-31 (×9): 10 mg via ORAL
  Filled 2018-05-22 (×9): qty 1

## 2018-05-22 MED ORDER — LABETALOL HCL 5 MG/ML IV SOLN: 10.0000 mg | INTRAVENOUS | Status: DC | PRN

## 2018-05-22 MED ORDER — TRAMADOL HCL 50 MG PO TABS
50.0000 mg | ORAL_TABLET | Freq: Four times a day (QID) | ORAL | Status: DC | PRN
  Administered 2018-05-23 – 2018-05-28 (×4): 50 mg via ORAL
  Filled 2018-05-22 (×4): qty 1

## 2018-05-22 MED ORDER — SODIUM CHLORIDE 0.9 % IV SOLN
INTRAVENOUS | Status: AC | PRN
  Administered 2018-05-22: 500 mL via INTRAVENOUS

## 2018-05-22 MED ORDER — APIXABAN 5 MG PO TABS
5.0000 mg | ORAL_TABLET | Freq: Two times a day (BID) | ORAL | Status: DC
  Filled 2018-05-22: qty 1

## 2018-05-22 MED ORDER — NITROGLYCERIN 0.4 MG SL SUBL: 0.4000 mg | SUBLINGUAL_TABLET | SUBLINGUAL | Status: DC | PRN

## 2018-05-22 MED ORDER — ONDANSETRON HCL 4 MG/2ML IJ SOLN: 4.0000 mg | Freq: Four times a day (QID) | INTRAMUSCULAR | Status: DC | PRN

## 2018-05-22 MED ORDER — SODIUM CHLORIDE 0.9 % IV SOLN
500.0000 mL | Freq: Once | INTRAVENOUS | Status: AC | PRN
  Administered 2018-05-22: 500 mL via INTRAVENOUS

## 2018-05-22 MED ORDER — FAMOTIDINE IN NACL 20-0.9 MG/50ML-% IV SOLN
20.0000 mg | Freq: Two times a day (BID) | INTRAVENOUS | Status: DC
  Administered 2018-05-22 – 2018-05-23 (×4): 20 mg via INTRAVENOUS
  Filled 2018-05-22 (×4): qty 50

## 2018-05-22 MED ORDER — FAMOTIDINE 20 MG PO TABS: 40.0000 mg | ORAL_TABLET | ORAL | Status: DC | PRN

## 2018-05-22 MED ORDER — MORPHINE SULFATE (PF) 4 MG/ML IV SOLN
2.0000 mg | INTRAVENOUS | Status: DC | PRN
  Administered 2018-05-22 – 2018-05-24 (×5): 4 mg via INTRAVENOUS
  Filled 2018-05-22 (×5): qty 1

## 2018-05-22 SURGICAL SUPPLY — 21 items
BALLN VIATRAC 5X30X135 (BALLOONS) ×3
BALLN VIATRAC 6X20X135 (BALLOONS) ×3
BALLOON VIATRAC 5X30X135 (BALLOONS) ×1 IMPLANT
BALLOON VIATRAC 6X20X135 (BALLOONS) ×1 IMPLANT
CATH ANGIO 5F 100CM .035 PIG (CATHETERS) ×3 IMPLANT
CATH BEACON 5 .035 100 JB1 TIP (CATHETERS) ×3 IMPLANT
DEVICE EMBOSHIELD NAV6 4.0-7.0 (FILTER) ×3 IMPLANT
DEVICE PRESTO INFLATION (MISCELLANEOUS) ×3 IMPLANT
DEVICE STARCLOSE SE CLOSURE (Vascular Products) ×3 IMPLANT
DEVICE TORQUE .025-.038 (MISCELLANEOUS) ×3 IMPLANT
KIT CAROTID MANIFOLD (MISCELLANEOUS) ×3 IMPLANT
NEEDLE ENTRY 21GA 7CM ECHOTIP (NEEDLE) ×3 IMPLANT
PACK ANGIOGRAPHY (CUSTOM PROCEDURE TRAY) ×3 IMPLANT
SET INTRO CAPELLA COAXIAL (SET/KITS/TRAYS/PACK) ×3 IMPLANT
SHEATH BRITE TIP 6FRX11 (SHEATH) ×3 IMPLANT
SHEATH SHUTTLE SELECT 6F (SHEATH) ×3 IMPLANT
STENT XACT CAR 10X30X136 (Permanent Stent) ×3 IMPLANT
STENT XACT CAR 9-7X40X136 (Permanent Stent) ×3 IMPLANT
WIRE AQUATRACK .035X260CM (WIRE) ×3 IMPLANT
WIRE G VAS 035X260 STIFF (WIRE) ×3 IMPLANT
WIRE J 3MM .035X145CM (WIRE) ×3 IMPLANT

## 2018-05-22 NOTE — Progress Notes (Signed)
Dr Delana Meyer aware of am chest pain ekg and stat trop ordered. Awaiting results

## 2018-05-22 NOTE — Op Note (Signed)
OPERATIVE NOTE   DATE: 05/22/2018  PROCEDURE: 1.  Ultrasound guidance for vascular access right femoral artery 2.  Placement of a 9 x 7 x 40 exact stent with the use of the NAV-6 embolic protection device in the right internal carotid artery 3. Placement of a 10 x 30 straight exact stent in the mid right common carotid artery separate and distinct location  PRE-OPERATIVE DIAGNOSIS: 1.  Symptomatic 90% right internal carotid artery stenosis. 2.  Symptomatic 70 to 80% stenosis of the mid right common carotid artery 3.  Acute right temporal stroke  POST-OPERATIVE DIAGNOSIS:  Same as above  SURGEON: Hortencia Pilar, MD  ASSISTANT(S): Leotis Pain, MD  ANESTHESIA: local/MCS  ESTIMATED BLOOD LOSS: 100 cc  CONTRAST: 40 cc  FLUORO TIME: 8.7 minutes  MODERATE CONSCIOUS SEDATION TIME: Patient is allergic to Versed and so a combination of fentanyl and Benadryl was used intravenously   FINDING(S): 1.   Greater than 90% stenosis of the right internal carotid artery extending slightly into the bulb a separate and distinct 70 to 80% stenosis of the right mid common carotid artery is also identified.  This cooperates the CT scan which clearly shows 2 separate lesions as dictated in the CT scan report by the radiologist.  Given the acute right temporal stroke there is no way to determine which of the lesions was actually responsible for the stroke and therefore both lesions must be treated as they are both greater than the threshold for causing symptoms and placing the patient at high risk for recurrent cerebrovascular accidents.    SPECIMEN(S):   none  INDICATIONS:   Patient is a 84 y.o. male who presents with tandem lesions in the right carotid artery system.  The patient has he has a high bifurcation as well as 2 lesions that are separate and would require separate surgical approaches.  Furthermore on evaluation with diagnostic angiography it appears that the right common carotid lesion  would not be amenable to surgery without trapdoor thoracotomy.  Given the high bifurcation and the poor accessibility of the common carotid lesion as well as the patient's comorbidities which are significant carotid artery stenting was felt to be preferred to endarterectomy for that reason.  Risks and benefits were discussed and informed consent was obtained.   DESCRIPTION: After obtaining full informed written consent, the patient was brought back to the vascular suite and placed supine upon the table.  The patient received IV antibiotics prior to induction. Moderate conscious sedation was administered during a face to face encounter with the patient throughout the procedure with my supervision of the RN administering medicines and monitoring the patients vital signs and mental status throughout from the start of the procedure until the patient was taken to the recovery room.  After obtaining adequate anesthesia, the patient was prepped and draped in the standard fashion.   The right femoral artery was visualized with ultrasound and found to be widely patent. It was then accessed under direct ultrasound guidance without difficulty with a Seldinger needle. A permanent image was recorded. A J-wire was placed and we then placed a 6 French sheath. The patient was then heparinized and a total of 7000 units of intravenous heparin were given and an ACT was checked to confirm successful anticoagulation. A pigtail catheter was then placed into the ascending aorta. This showed a type I arch the common carotid lesion is already visible on the arch aortogram. I then selectively cannulated the innominate artery and then the common  carotid artery on the right without difficulty with a JB 1 catheter and advanced into the mid right common carotid artery.  Given the tandem lesions and the added complexity of treating the simultaneously as well as the patient's comorbidities and his inability to have appropriate conscious  sedation making the case significantly more difficult Dr. Lucky Cowboy was asked to assist so as to prevent complications.  Cervical and cerebral carotid angiography was then performed. There were no obvious intracranial filling defects. The carotid bifurcation demonstrated 90% stenosis located within the origin of the right internal carotid artery.  I then advanced into the external carotid artery with a aqua track wire and the JB 1 catheter and then exchanged for the Amplatz Super Stiff wire. Over the Amplatz Super Stiff wire, a 6 [place] shuttle sheath was placed into the mid common carotid artery just below the level of the common carotid lesion.  Again adding to the complexity of this case and the need for a first assistant this placed the sheath at a much lower level than would normally be selected however I did not wish to cross the lesion with the sheath.   I then used the large NAV-6  Embolic protection device and crossed the lesion and parked this in the distal internal carotid artery at the base of the skull.  I then selected a 9 x 7 x 40 exact stent. This was deployed across the lesion encompassing it in its entirety. A 5 x 20 length balloon was used to post dilate the stent. Only about a 25-30 % residual stenosis was present after angioplasty.  No neurological changes were noted and we elected to move forward with treating the common carotid lesion.  Magnified steep oblique views of the proximal and mid common carotid artery were then obtained.  This identified the lesion quite nicely.  A 10 x 30 exact stent was then opened onto the field.  It was advanced across the lesion.  2 magnified images were obtained to ensure proper stent placement and then the stent was deployed without difficulty.  It was postdilated with a 6 x 20 balloon inflated to 18 atm for a maximal diameter of 6.5 mm.  There is approximately 20% residual stenosis noted.  Completion angiogram showed normal intracranial filling without new  defects. At this point I elected to terminate the procedure. The sheath was removed and StarClose closure device was deployed in the right femoral artery with excellent hemostatic result. The patient was taken to the recovery room in stable condition having tolerated the procedure well.  COMPLICATIONS: none  CONDITION: stable  Hortencia Pilar 05/22/2018 9:46 AM   This note was created with Dragon Medical transcription system. Any errors in dictation are purely unintentional.

## 2018-05-22 NOTE — Care Management (Signed)
Notified [facility] of admission for carotid procedure for stenosis.  Currently providing RN PT OT

## 2018-05-22 NOTE — Progress Notes (Signed)
[facility] for heparin drip management  Indication: atrial fibrillation  Allergies  Allergen Reactions  . Clarithromycin Diarrhea  . Codeine Other (See Comments)    Unknown  . Flomax [Tamsulosin Hcl] Other (See Comments)    Unknown  . Lamisil [Terbinafine] Other (See Comments)    Unknown  . Levofloxacin Other (See Comments)    Diplopia. NEVER put patient on levoquin!  . Sertraline Other (See Comments)    Unknown  . Sulfa Antibiotics Other (See Comments)    Unknown  . Versed [Midazolam] Other (See Comments)    Hallucinations and confusion    Patient Measurements: Height: 5\' 9"  (175.3 cm) Weight: 179 lb 10.8 oz (81.5 kg) IBW/kg (Calculated) : 70.7  Vital Signs: Temp: 97.7 F (36.5 C) (08/14 1220) Temp Source: Oral (08/14 1220) BP: 126/68 (08/14 1300) Pulse Rate: 72 (08/14 1300)  Labs: Recent Labs    05/21/18 1047 05/22/18 0747  CREATININE 0.80  --   TROPONINI  --  <0.03    Estimated Creatinine Clearance: 63.8 mL/min (by C-G formula based on SCr of 0.8 mg/dL).   Medical History: Past Medical History:  Diagnosis Date  . Abdominal fibromatosis   . 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  s/p PTCA of the mid RCA 1/96, also  s/p  CABG x 4 7/96  . Complication of anesthesia    confusion and hallucinations for several days after receiving Versed  . Degenerative disc disease   . Diabetes mellitus ([place])   . Glaucoma   . Hypercholesterolemia   . Hypertension   . Migraine headache    history of  . Myocardial infarction ([place]) 1996  . Osteoarthritis   . Sleep apnea   . Stroke ([place]) 02/22/2017   affected left side     Medications:  Scheduled:  . aspirin EC  162 mg Oral Once  . [START ON 05/23/2018] aspirin EC  81 mg Oral Daily  . atorvastatin  40 mg Oral q1800  . clopidogrel      . [START ON 05/23/2018] clopidogrel  75 mg Oral Daily  . [START ON 05/23/2018] docusate sodium  100 mg Oral  Daily  . [START ON 05/23/2018] FLUoxetine  10 mg Oral Daily  . fluticasone  2 spray Each Nare Daily  . latanoprost  1 drop Both Eyes QHS  . [START ON 05/23/2018] loratadine  10 mg Oral Daily  . [START ON 05/23/2018] metoprolol succinate  12.5 mg Oral Daily  . promethazine  12.5 mg Intravenous Once   Infusions:  .  ceFAZolin (ANCEF) IV    . dextrose 5 % and 0.9 % NaCl with KCl 20 mEq/L 75 mL/hr at 05/22/18 1122  . DOPamine    . DOPamine 5 mcg/kg/min (05/22/18 1131)  . famotidine (PEPCID) IV    . heparin    . valproate sodium      Assessment: Pharmacy consulted for heparin drip management for 84 yo male admitted to the ICU s/p carotid stenting. Patient unable to tolerate oral medications at this time. Patient with recent history of acute CVA and history significant for atrial fibrillation. Patient takes apixaban 5mg  BID as an outpatient. Patient's last dose of apixaban was at   Goal of Therapy:  Heparin level 0.3-0.7 units/ml aPTT 68-102 seconds Monitor platelets by anticoagulation protocol: Yes   Plan:  Per MD, proceed with no bolus. Will initiate heparin at 950 units/hr. Will obtain initial aPTT at 0200. Will obtain  heparin level daily until aPTT and anti-Xa levels correlate.   Pharmacy will continue to monitor and adjust per consult.   Simpson,Michael L 05/22/2018,5:22 PM

## 2018-05-22 NOTE — Care Management (Signed)
PT and OT evaluation requested for when patient is stable. RNCM received notification that family would like to attempt rehab for this patient at discharge. CSW updated. RNCM will follow.

## 2018-05-22 NOTE — Care Management (Signed)
RNCM attempted to reach out to patient's family however, they have stepped away from ICU and are not currently in waiting area.  I have attempted to reach Kyle Richardson by phone [phone] and [phone] however no answer. Patient is active with [facility] for community case management and has private duty services through [employer]. PCP is with [facility] and listed provided is Dr. Alva Garnet also with [facility] (pulmonology). RNCM will follow. PT/OT not currently ordered.

## 2018-05-22 NOTE — Care Management (Signed)
Message left for patient's wife to call this RNCM to discuss transition of care needs.

## 2018-05-22 NOTE — H&P (Signed)
[facility] Admission History & Physical  MRN : [id number]  Kyle Richardson is a 84 y.o. (1928/08/18) male who presents with chief complaint of No chief complaint on file. Marland Kitchen  History of Present Illness:   The patient is seen for follow up evaluation of carotid stenosis. The carotid stenosis was identified in July when he was brought to the emergency room with right arm weakness and right eye visual changes.  At that time it was identified that he had had a left hemispheric stroke.  I have personally reviewed the CT angiogram and concur there is a 75 to 80% stenosis at the origin of the right internal carotid artery this is also associated with a tandem 70% lesion in the distal common carotid artery on the right.  On the left there is a 70% stenosis at the origin of the left internal carotid artery  The patient denies interval amaurosis fugax. There is no recent history of TIA symptoms or focal motor deficits. There is a prior documented CVA.  The patient is taking enteric-coated aspirin 81 mg daily.  There is no history of migraine headaches. There is no history of seizures.  The patient has a history of coronary artery disease, no recent episodes of angina or shortness of breath. The patient denies PAD or claudication symptoms. There is a history of hyperlipidemia which is being treated with a statin.    Which makes me sad  Current Facility-Administered Medications  Medication Dose Route Frequency Provider Last Rate Last Dose  . 0.9 %  sodium chloride infusion   Intravenous Continuous Stegmayer, Kimberly A, PA-C      . ceFAZolin (ANCEF) IVPB 2g/100 mL premix  2 g Intravenous Once Stegmayer, Kimberly A, PA-C      . famotidine (PEPCID) tablet 40 mg  40 mg Oral PRN Stegmayer, Janalyn Harder, PA-C      . HYDROmorphone (DILAUDID) injection 1 mg  1 mg Intravenous Once PRN Stegmayer, Kimberly A, PA-C      . methylPREDNISolone sodium succinate (SOLU-MEDROL) 125 mg/2 mL  injection 125 mg  125 mg Intravenous PRN Stegmayer, Kimberly A, PA-C      . ondansetron (ZOFRAN) injection 4 mg  4 mg Intravenous Q6H PRN Stegmayer, Janalyn Harder, PA-C        Past Medical History:  Diagnosis Date  . Abdominal fibromatosis   . 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  s/p PTCA of the mid RCA 1/96, also  s/p  CABG x 4 7/96  . Complication of anesthesia    confusion and hallucinations for several days after receiving Versed  . Degenerative disc disease   . Diabetes mellitus ([place])   . Glaucoma   . Hypercholesterolemia   . Hypertension   . Migraine headache    history of  . Myocardial infarction ([place]) 1996  . Osteoarthritis   . Sleep apnea   . Stroke [facility]) 02/22/2017   affected left side     Past Surgical History:  Procedure Laterality Date  . APPENDECTOMY    . CAROTID ANGIOGRAPHY Bilateral 04/19/2018   Procedure: CAROTID ANGIOGRAPHY;  Surgeon: Algernon Huxley, MD;  Location: [facility];  Service: Cardiovascular;  Laterality: Bilateral;  . CATARACT EXTRACTION W/ INTRAOCULAR LENS  IMPLANT, BILATERAL    . CHOLECYSTECTOMY     Removed  . CORONARY ARTERY BYPASS GRAFT  7/96   x4  . HEMORRHOID SURGERY    . HERNIA REPAIR    .  TONSILLECTOMY    . UPP and tonsillectomy  1/86    Social History Social History   Tobacco Use  . Smoking status: Never Smoker  . Smokeless tobacco: Never Used  Substance Use Topics  . Alcohol use: No    Alcohol/week: 0.0 standard drinks  . Drug use: No    Family History Family History  Problem Relation Age of Onset  . Heart disease Mother        died age 50  . Heart disease Father        and other vascular disease  . Diabetes Unknown        four siblings  . Heart disease Brother        s/p CABG  . Colon cancer Neg Hx   . Prostate cancer Neg Hx   No family history of bleeding/clotting disorders, porphyria or autoimmune disease  Allergies  Allergen Reactions  . Clarithromycin Diarrhea   . Codeine Other (See Comments)    Unknown  . Flomax [Tamsulosin Hcl] Other (See Comments)    Unknown  . Lamisil [Terbinafine] Other (See Comments)    Unknown  . Levofloxacin Other (See Comments)    Diplopia. NEVER put patient on levoquin!  . Sertraline Other (See Comments)    Unknown  . Sulfa Antibiotics Other (See Comments)    Unknown  . Versed [Midazolam] Other (See Comments)    Hallucinations and confusion     REVIEW OF SYSTEMS (Negative unless checked)  Constitutional: [] Weight loss  [] Fever  [] Chills Cardiac: [x] Chest pain   [] Chest pressure   [] Palpitations   [] Shortness of breath when laying flat   [] Shortness of breath at rest   [] Shortness of breath with exertion. Vascular:  [] Pain in legs with walking   [] Pain in legs at rest   [] Pain in legs when laying flat   [] Claudication   [] Pain in feet when walking  [] Pain in feet at rest  [] Pain in feet when laying flat   [] History of DVT   [] Phlebitis   [] Swelling in legs   [] Varicose veins   [] Non-healing ulcers Pulmonary:   [] Uses home oxygen   [] Productive cough   [] Hemoptysis   [] Wheeze  [x] COPD   [] Asthma Neurologic:  [] Dizziness  [] Blackouts   [] Seizures   [] History of stroke   [] History of TIA  [] Aphasia   [] Temporary blindness   [] Dysphagia   [] Weakness or numbness in arms   [] Weakness or numbness in legs Musculoskeletal:  [] Arthritis   [] Joint swelling   [] Joint pain   [] Low back pain Hematologic:  [] Easy bruising  [] Easy bleeding   [] Hypercoagulable state   [] Anemic  [] Hepatitis Gastrointestinal:  [] Blood in stool   [] Vomiting blood  [] Gastroesophageal reflux/heartburn   [] Difficulty swallowing. Genitourinary:  [] Chronic kidney disease   [] Difficult urination  [] Frequent urination  [] Burning with urination   [] Blood in urine Skin:  [] Rashes   [] Ulcers   [] Wounds Psychological:  [] History of anxiety   []  History of major depression.  Physical Examination  Vitals:   05/22/18 0700 05/22/18 0745  BP: (!) 159/91 (!) 160/62   Pulse: 61 (!) 54  Resp: 18 18  Temp: 97.9 F (36.6 C)   TempSrc: Oral   SpO2: 95% 94%  Weight: 72.6 kg   Height: 5\' 8"  (1.727 m)    Body mass index is 24.34 kg/m. Gen: WD/WN, NAD Head: [place]/AT, No temporalis wasting. Prominent temp pulse not noted. Ear/Nose/Throat: Hearing grossly intact, nares w/o erythema or drainage, oropharynx w/o Erythema/Exudate,  Eyes: Conjunctiva  clear, sclera non-icteric Neck: Trachea midline.  No JVD.  Pulmonary:  Good air movement, respirations not labored, no use of accessory muscles.  Cardiac: RRR, normal S1, S2. Vascular: Bilateral carotid bruits Vessel Right Left  Radial Palpable Palpable  Ulnar Palpable Palpable  Brachial Palpable Palpable  Carotid Palpable Palpable  Aorta Not palpable N/A  Femoral Palpable Palpable  Popliteal Palpable Palpable  PT Palpable Palpable  DP Palpable Palpable   Gastrointestinal: soft, non-tender/non-distended. No guarding/reflex.  Musculoskeletal: M/S 5/5 throughout left right arm is a 0 out of 5 right leg is a 3 out of 5.  Extremities without ischemic changes.  No deformity or atrophy.  Neurologic: Sensation grossly intact in extremities.  Symmetrical.  Speech is fluent. Motor exam as listed above. Psychiatric: Judgment intact, Mood & affect appropriate for pt's clinical situation. Dermatologic: No rashes or ulcers noted.  No cellulitis or open wounds. Lymph : No Cervical, Axillary, or Inguinal lymphadenopathy.     CBC Lab Results  Component Value Date   WBC 7.7 04/15/2018   HGB 15.7 04/15/2018   HCT 45.3 04/15/2018   MCV 94.5 04/15/2018   PLT 192 04/15/2018    BMET    Component Value Date/Time   NA 140 04/15/2018 1550   NA 140 03/23/2014 2024   K 4.7 04/15/2018 1550   K 3.8 03/23/2014 2024   CL 103 04/15/2018 1550   CL 104 03/23/2014 2024   CO2 30 04/15/2018 1550   CO2 27 03/23/2014 2024   GLUCOSE 167 (H) 04/15/2018 1550   GLUCOSE 156 04/16/2015 1220   BUN 13 05/21/2018 1047   BUN 13  03/23/2014 2024   CREATININE 0.80 05/21/2018 1047   CREATININE 0.97 03/23/2014 2024   CALCIUM 9.2 04/15/2018 1550   CALCIUM 8.7 03/23/2014 2024   GFRNONAA >60 05/21/2018 1047   GFRNONAA >60 03/23/2014 2024   GFRAA >60 05/21/2018 1047   GFRAA >60 03/23/2014 2024   Estimated Creatinine Clearance: 61.8 mL/min (by C-G formula based on SCr of 0.8 mg/dL).  COAG Lab Results  Component Value Date   INR 1.00 04/15/2018   INR 1.12 02/22/2018   INR 1.4 02/17/2014    Radiology No results found.  Assessment/Plan 1.  Bilateral carotid stenosis: Given the acute right infarct while on maximal medical therapy secondary to his recent left hemispheric CVA in association with a 75 to 80% right internal carotid artery stenosis with the tandem 70% common carotid lesion I do recommend that he undergo surgery/stenting of the right side.  Based on the unique characteristics that the tandem lesions represent I believe angiography is warranted to more appropriately plan whether surgery or stenting would be optimal.  The internal carotid artery lesion is quite high and appears to be at the level of C2.  The length of treating both lesions is less desirable for stenting however I am not sure that you could completely expose the common carotid lesion surgically again representing an exposure challenge.  I believe conventional angiography will help decide these factors.  I have discussed this with the patient and his family and he seems amenable.  The risks and benefits were reviewed.  I will plan for carotid stenting today.  I will also discussed the case with Dr. Saralyn Pilar who is his cardiologist.  2.  Acute CVA in association with recent left hemispheric CVA: Neurology's input is greatly appreciated we will continue maximal medical therapy as well as physical therapy and rehab.  3.  Atrial fibrillation: Continue Eliquis for now.  4.  Coronary artery disease:Continue cardiac and antihypertensive medications  as already ordered and reviewed, no changes at this time.Continue statin as ordered and reviewed, no changes at this time.  Nitrates PRN for chest pain   Hortencia Pilar, MD  05/22/2018 8:24 AM

## 2018-05-23 ENCOUNTER — Inpatient Hospital Stay: Payer: PPO

## 2018-05-23 DIAGNOSIS — R4701 Aphasia: Secondary | ICD-10-CM

## 2018-05-23 DIAGNOSIS — I480 Paroxysmal atrial fibrillation: Secondary | ICD-10-CM

## 2018-05-23 DIAGNOSIS — I63239 Cerebral infarction due to unspecified occlusion or stenosis of unspecified carotid arteries: Secondary | ICD-10-CM

## 2018-05-23 LAB — BASIC METABOLIC PANEL
Anion gap: 6 (ref 5–15)
BUN: 11 mg/dL (ref 8–23)
CALCIUM: 8.5 mg/dL — AB (ref 8.9–10.3)
CO2: 28 mmol/L (ref 22–32)
Chloride: 106 mmol/L (ref 98–111)
Creatinine, Ser: 0.82 mg/dL (ref 0.61–1.24)
GFR calc Af Amer: >60 mL/min (ref >60)
GLUCOSE: 147 mg/dL — AB (ref 70–99)
Potassium: 4.2 mmol/L (ref 3.5–5.1)
Sodium: 140 mmol/L (ref 135–145)

## 2018-05-23 LAB — CBC
HCT: 39.1 % — ABNORMAL LOW (ref 40.0–52.0)
Hemoglobin: 13.4 g/dL (ref 13.0–18.0)
MCH: 32.8 pg (ref 26.0–34.0)
MCHC: 34.2 g/dL (ref 32.0–36.0)
MCV: 95.9 fL (ref 80.0–100.0)
PLATELETS: 145 10*3/uL — AB (ref 150–440)
RBC: 4.07 MIL/uL — ABNORMAL LOW (ref 4.40–5.90)
RDW: 13.3 % (ref 11.5–14.5)
WBC: 7.5 10*3/uL (ref 3.8–10.6)

## 2018-05-23 LAB — GLUCOSE, CAPILLARY
GLUCOSE-CAPILLARY: 101 mg/dL — AB (ref 70–99)
Glucose-Capillary: 133 mg/dL — ABNORMAL HIGH (ref 70–99)

## 2018-05-23 LAB — HEPARIN LEVEL (UNFRACTIONATED): Heparin Unfractionated: 0.43 IU/mL (ref 0.30–0.70)

## 2018-05-23 LAB — APTT: aPTT: 89 seconds — ABNORMAL HIGH (ref 24–36)

## 2018-05-23 LAB — POCT ACTIVATED CLOTTING TIME: ACTIVATED CLOTTING TIME: 263 s

## 2018-05-23 MED ORDER — ASPIRIN EC 81 MG PO TBEC: 81.0000 mg | DELAYED_RELEASE_TABLET | Freq: Every day | ORAL | Status: DC

## 2018-05-23 MED ORDER — SODIUM CHLORIDE 0.9 % IV SOLN
INTRAVENOUS | Status: DC | PRN
  Administered 2018-05-23: 22:00:00 via INTRAVENOUS

## 2018-05-23 MED ORDER — CLOPIDOGREL BISULFATE 75 MG PO TABS
75.0000 mg | ORAL_TABLET | Freq: Every day | ORAL | Status: DC
  Administered 2018-05-24 – 2018-05-31 (×8): 75 mg via ORAL
  Filled 2018-05-23 (×8): qty 1

## 2018-05-23 MED ORDER — ASPIRIN EC 81 MG PO TBEC: 162.0000 mg | DELAYED_RELEASE_TABLET | ORAL | Status: DC

## 2018-05-23 MED ORDER — APIXABAN 5 MG PO TABS
5.0000 mg | ORAL_TABLET | Freq: Two times a day (BID) | ORAL | Status: DC
  Administered 2018-05-23 – 2018-05-31 (×17): 5 mg via ORAL
  Filled 2018-05-23 (×18): qty 1

## 2018-05-23 MED ORDER — ASPIRIN 325 MG PO TABS
162.0000 mg | ORAL_TABLET | Freq: Every day | ORAL | Status: DC
  Administered 2018-05-23 – 2018-05-30 (×7): 162 mg via ORAL
  Filled 2018-05-23 (×2): qty 0.5
  Filled 2018-05-23: qty 1
  Filled 2018-05-23 (×3): qty 0.5
  Filled 2018-05-23 (×2): qty 1

## 2018-05-23 MED ORDER — CLOPIDOGREL BISULFATE 75 MG PO TABS
300.0000 mg | ORAL_TABLET | ORAL | Status: AC
Start: 2018-05-23 — End: 2018-05-23
  Administered 2018-05-23: 300 mg via ORAL
  Filled 2018-05-23: qty 4

## 2018-05-23 NOTE — Progress Notes (Signed)
OT Cancellation Note  Patient Details Name: Kyle Richardson MRN: [id number] DOB: [birth year]   Cancelled Treatment:    Reason Eval/Treat Not Completed: Patient not medically ready. Order received, chart reviewed. Upon attempt, RN with pt, states he was agitated and recently given valium. Pt unable to participate in therapy this afternoon. Will re-attempt next date as pt is medically appropriate.   Jeni Salles, MPH, MS, OTR/L ascom [phone] 05/23/18, 3:27 PM

## 2018-05-23 NOTE — Progress Notes (Signed)
[facility]  Daily Progress Note   Subjective  - 1 Day Post-Op  Patient remains confused this morning but is much more verbal.  He is otherwise appearing comfortable.  He is moving all extremities.  Although his right remains somewhat weak  Objective Vitals:   05/23/18 0600 05/23/18 0800 05/23/18 0900 05/23/18 1000  BP: 104/60 129/76 99/63 109/63  Pulse: (!) 48 65 (!) 56 (!) 54  Resp: 14 18 16 13   Temp:   98.3 F (36.8 C)   TempSrc:   Oral   SpO2: 98%  95% 95%  Weight:      Height:        Intake/Output Summary (Last 24 hours) at 05/23/2018 1301 Last data filed at 05/23/2018 1134 Gross per 24 hour  Intake 942.7 ml  Output 1230 ml  Net -287.3 ml    PULM  Normal effort , no use of accessory muscles CV  No JVD, RRR Abd      No distended, nontender VASC  mental status is somewhat more confused than baseline.  The left arm and leg remain 5 out of 5 strength the right leg is also 5 out of 5 strength.  The right arm is a 3 out of 5 but this is his baseline.  Laboratory CBC    Component Value Date/Time   WBC 7.5 05/23/2018 0214   HGB 13.4 05/23/2018 0214   HGB 13.6 03/23/2014 2024   HCT 39.1 (L) 05/23/2018 0214   HCT 40.9 03/23/2014 2024   PLT 145 (L) 05/23/2018 0214   PLT 182 03/23/2014 2024    BMET    Component Value Date/Time   NA 140 05/23/2018 0214   NA 140 03/23/2014 2024   K 4.2 05/23/2018 0214   K 3.8 03/23/2014 2024   CL 106 05/23/2018 0214   CL 104 03/23/2014 2024   CO2 28 05/23/2018 0214   CO2 27 03/23/2014 2024   GLUCOSE 147 (H) 05/23/2018 0214   GLUCOSE 156 04/16/2015 1220   BUN 11 05/23/2018 0214   BUN 13 03/23/2014 2024   CREATININE 0.82 05/23/2018 0214   CREATININE 0.97 03/23/2014 2024   CALCIUM 8.5 (L) 05/23/2018 0214   CALCIUM 8.7 03/23/2014 2024   GFRNONAA >60 05/23/2018 0214   GFRNONAA >60 03/23/2014 2024   GFRAA >60 05/23/2018 0214   GFRAA >60 03/23/2014 2024    Assessment/Planning: POD #1 s/p stenting of  tandem stenoses right cervical carotid artery system   The patient's dopamine is off but he remains borderline with respect to his heart rate and pressure if this does not improve I would restart heparin to maintain a mean of approximately 60 and/or a systolic greater than 973.  Yesterday because of his vomiting he did not tolerate oral antiplatelet agents and therefore heparin was started.  He did much better today with his breakfast if he keeps down his oral antiplatelet agents than the heparin can be stopped.  His confusion remains somewhat worse than his baseline and therefore I do not feel he can be discharged home yet.  Given the question of his hemodynamics I would not move him from the unit but will ask the ICU team to evaluate him as he may require further care within the ICU over the next day or 2.  However, I am hopeful that by this weekend he would be fully recovered to his baseline and therefore fit for discharge.  Hortencia Pilar  05/23/2018, 1:01 PM

## 2018-05-23 NOTE — Progress Notes (Signed)
Pt woke up agitated and trying to get out of the bed. Pt given PRN valium and voided. Pt repositioned and now resting comfortably.

## 2018-05-23 NOTE — Progress Notes (Signed)
PT Cancellation Note  Patient Details Name: Kyle Richardson MRN: [id number] DOB: 1928-09-09   Cancelled Treatment:     Order received. Chart reviewed. PT attempted session this afternoon. Upon arrival pt still has PAD in place and RN states that she had to sedate him a short time ago due to agitation. Pt currently with sitter present in room. PT will attempt back on next date that pt is medically appropriate.  Yolonda Kida, SPT    [name] 05/23/2018, 3:39 PM

## 2018-05-23 NOTE — Progress Notes (Signed)
[place] for heparin drip management  Indication: atrial fibrillation  Allergies  Allergen Reactions  . Clarithromycin Diarrhea  . Codeine Other (See Comments)    Unknown  . Flomax [Tamsulosin Hcl] Other (See Comments)    Unknown  . Lamisil [Terbinafine] Other (See Comments)    Unknown  . Levofloxacin Other (See Comments)    Diplopia. NEVER put patient on levoquin!  . Sertraline Other (See Comments)    Unknown  . Sulfa Antibiotics Other (See Comments)    Unknown  . Versed [Midazolam] Other (See Comments)    Hallucinations and confusion    Patient Measurements: Height: 5\' 9"  (175.3 cm) Weight: 179 lb 10.8 oz (81.5 kg) IBW/kg (Calculated) : 70.7  Vital Signs: Temp: 98.5 F (36.9 C) (08/14 2000) Temp Source: Oral (08/14 2000) BP: 99/53 (08/15 0000) Pulse Rate: 56 (08/15 0000)  Labs: Recent Labs    05/21/18 1047 05/22/18 0747 05/22/18 1725 05/23/18 0214  HGB  --   --   --  13.4  HCT  --   --   --  39.1*  PLT  --   --   --  145*  APTT  --   --  29 89*  HEPARINUNFRC  --   --  <0.10* 0.43  CREATININE 0.80  --   --  0.82  TROPONINI  --  <0.03  --   --     Estimated Creatinine Clearance: 62.3 mL/min (by C-G formula based on SCr of 0.82 mg/dL).   Medical History: Past Medical History:  Diagnosis Date  . Abdominal fibromatosis   . 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  s/p PTCA of the mid RCA 1/96, also  s/p  CABG x 4 7/96  . Complication of anesthesia    confusion and hallucinations for several days after receiving Versed  . Degenerative disc disease   . Diabetes mellitus ([place])   . Glaucoma   . Hypercholesterolemia   . Hypertension   . Migraine headache    history of  . Myocardial infarction ([place]) 1996  . Osteoarthritis   . Sleep apnea   . Stroke ([place]) 02/22/2017   affected left side     Medications:  Scheduled:  . aspirin EC  162 mg Oral Once  . aspirin EC  81 mg Oral  Daily  . atorvastatin  40 mg Oral q1800  . clopidogrel  75 mg Oral Daily  . docusate sodium  100 mg Oral Daily  . FLUoxetine  10 mg Oral Daily  . fluticasone  2 spray Each Nare Daily  . latanoprost  1 drop Both Eyes QHS  . loratadine  10 mg Oral Daily  . metoprolol succinate  12.5 mg Oral Daily  . promethazine  12.5 mg Intravenous Once   Infusions:  . dextrose 5 % and 0.9 % NaCl with KCl 20 mEq/L 75 mL/hr at 05/22/18 1122  . DOPamine 4 mcg/kg/min (05/22/18 1612)  . famotidine (PEPCID) IV Stopped (05/22/18 2359)  . heparin 950 Units/hr (05/22/18 1800)  . valproate sodium Stopped (05/22/18 1935)    Assessment: Pharmacy consulted for heparin drip management for 84 yo male admitted to the ICU s/p carotid stenting. Patient unable to tolerate oral medications at this time. Patient with recent history of acute CVA and history significant for atrial fibrillation. Patient takes apixaban 5mg  BID as an outpatient. Patient's last dose of apixaban was at   Goal of Therapy:  Heparin  level 0.3-0.7 units/ml aPTT 68-102 seconds Monitor platelets by anticoagulation protocol: Yes   Plan:  08/15 @ 0200 aPTT 89, HL 0.43, both therapeutic and correlating, as a result will dose off of heparin level. Will continue current rate at 950 units/hr and will recheck HL at 1000. CBC appears to be WNL but down from baseline (1 month ago) hgb/hct/plts: 15/45/170.  Tobie Lords, PharmD, BCPS Clinical Pharmacist 05/23/2018

## 2018-05-23 NOTE — Progress Notes (Signed)
PT Cancellation Note  Patient Details Name: Kamonte Mcmichen MRN: [id number] DOB: 1927-10-21   Cancelled Treatment:     Order received. Chart reviewed. PT attempted session this morning however RN states that pt still has PAD in place. RN states that PAD will be removed completely by later this afternoon. PT plans to attempt session at later time when pt is medically appropriate.  Yolonda Kida, SPT   Alexandria Shiflett 05/23/2018, 12:52 PM

## 2018-05-23 NOTE — Progress Notes (Signed)
Patient very confused and restless throughout the night. Was told in report that patient was confused during day shift after his procedure. Carolan Shiver, NP is aware. Morphine given once as ordered. Patient is unable to express needs. Caregiver at bedside. Patient remains on low dose Dopamine for a MAP goal of 65. Will continue to monitor closely.

## 2018-05-23 NOTE — Care Management (Signed)
Per rounding this AM, patient is baseline confused at home however he is more confused now than before which may be ICU/hospital delirium. Wife has returned my call and is not currently at bedside. RN shared that family is concerned about patient returning to home.  PT/OT/SLP evaluation requested- in place.

## 2018-05-23 NOTE — Consult Note (Signed)
Kyle Richardson      Name: Kyle Richardson MRN: [id number] DOB: 05/01/1928    ADMISSION DATE:  05/22/2018 Richardson DATE:  05/23/2018  REFERRING MD :  Kyle Richardson   CHIEF COMPLAINT:   Altered mental status   HISTORY OF PRESENT ILLNESS   Kyle Richardson is a 84 year old male with history stated below who is s/p day 1 from right carotid stent placement who presents with altered mental status. History is gathered through chart review and speaking to the patient's wife, Kyle Richardson. Kyle Richardson tells me that the patient has had two recent strokes; one in May and then one about 6 weeks later. She states that his first stroke resulted in right sided weakness and that the second stroke resulted in left sided weakness. She states that the patient receives full care at home through home health, but that Kyle Richardson had progressed significantly since May. She reports he was able to walk with assistance and was able to to feed himself with prompting. She is very concerned about her husbands current status. She states that his change in speech after the procedure is what concerns her most and that he is also very confused.   As I am talking to Kyle Richardson, the patient is mumbling to family members and friends. The majority of his speech is clear, however he has inappropriate responses to questions asked. It is clear that he is not fully intact with the situation, but it is evident that he understands something is wrong. He remarks, "What is wrong with me?", "All the bones in my back are broken and deformed", "This is boring", and "I just don't feel good".        PAST MEDICAL HISTORY    :  Past Medical History:  Diagnosis Date  . Abdominal fibromatosis   . 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  s/p PTCA of the mid RCA 1/96, also  s/p  CABG x 4 7/96  . Complication of anesthesia    confusion and hallucinations for several days after receiving Versed  .  Degenerative disc disease   . Diabetes mellitus ([place])   . Glaucoma   . Hypercholesterolemia   . Hypertension   . Migraine headache    history of  . Myocardial infarction ([place]) 1996  . Osteoarthritis   . Sleep apnea   . Stroke [facility]) 02/22/2017   affected left side    Past Surgical History:  Procedure Laterality Date  . APPENDECTOMY    . CAROTID ANGIOGRAPHY Bilateral 04/19/2018   Procedure: CAROTID ANGIOGRAPHY;  Surgeon: Algernon Huxley, MD;  Location: [facility];  Service: Cardiovascular;  Laterality: Bilateral;  . CAROTID PTA/STENT INTERVENTION Right 05/22/2018   Procedure: CAROTID PTA/STENT INTERVENTION;  Surgeon: Katha Cabal, MD;  Location: [facility];  Service: Cardiovascular;  Laterality: Right;  . CATARACT EXTRACTION W/ INTRAOCULAR LENS  IMPLANT, BILATERAL    . CHOLECYSTECTOMY     Removed  . CORONARY ARTERY BYPASS GRAFT  7/96   x4  . HEMORRHOID SURGERY    . HERNIA REPAIR    . TONSILLECTOMY    . UPP and tonsillectomy  1/86   Prior to Admission medications   Medication Sig Start Date End Date Taking? Authorizing Provider  acetaminophen (TYLENOL) 500 MG tablet Take 1 tablet (500 mg total) by mouth every 6 (six) hours as needed. Patient taking differently: Take 500 mg by mouth every 6 (six) hours as needed  for moderate pain or headache.  04/01/18  Yes Angiulli, Lavon Paganini, PA-C  apixaban (ELIQUIS) 5 MG TABS tablet Take 1 tablet (5 mg total) by mouth 2 (two) times daily. 04/01/18  Yes Angiulli, Lavon Paganini, PA-C  atorvastatin (LIPITOR) 40 MG tablet Take 1 tablet (40 mg total) by mouth daily. Patient taking differently: Take 40 mg by mouth at bedtime.  04/01/18  Yes Angiulli, Lavon Paganini, PA-C  divalproex (DEPAKOTE ER) 500 MG 24 hr tablet Take 1 tablet (500 mg total) by mouth daily. Patient taking differently: Take 500 mg by mouth at bedtime.  04/01/18  Yes Angiulli, Lavon Paganini, PA-C  FLUoxetine (PROZAC) 10 MG capsule Take 1 capsule (10 mg total) by mouth daily. 04/01/18   Yes Angiulli, Lavon Paganini, PA-C  fluticasone (FLONASE) 50 MCG/ACT nasal spray SPRAY 2 SPRAYS IN EACH NOSTRIL ONCE A DAY Patient taking differently: SPRAY 1 SPRAY IN EACH NOSTRIL ONCE A DAY AT BEDTIME 02/20/18  Yes Einar Pheasant, MD  latanoprost (XALATAN) 0.005 % ophthalmic solution Place 1 drop into both eyes at bedtime.   Yes [provider]  loratadine (CLARITIN) 10 MG tablet Take 10 mg by mouth daily.   Yes [provider]  metoprolol succinate (TOPROL-XL) 25 MG 24 hr tablet Take 0.5 tablets (12.5 mg total) by mouth daily. 04/22/18  Yes Pyreddy, Reatha Harps, MD  Multiple Vitamins-Minerals (PRESERVISION AREDS 2 PO) Take 1 capsule by mouth 2 (two) times daily.   Yes [provider]  nitroGLYCERIN (NITROSTAT) 0.4 MG SL tablet Place 0.4 mg under the tongue every 5 (five) minutes as needed for chest pain.    Yes [provider]   Allergies  Allergen Reactions  . Clarithromycin Diarrhea  . Codeine Other (See Comments)    Unknown  . Flomax [Tamsulosin Hcl] Other (See Comments)    Unknown  . Lamisil [Terbinafine] Other (See Comments)    Unknown  . Levofloxacin Other (See Comments)    Diplopia. NEVER put patient on levoquin!  . Sertraline Other (See Comments)    Unknown  . Sulfa Antibiotics Other (See Comments)    Unknown  . Versed [Midazolam] Other (See Comments)    Hallucinations and confusion     FAMILY HISTORY   Family History  Problem Relation Age of Onset  . Heart disease Mother        died age 55  . Heart disease Father        and other vascular disease  . Diabetes Unknown        four siblings  . Heart disease Brother        s/p CABG  . Colon cancer Neg Hx   . Prostate cancer Neg Hx       SOCIAL HISTORY    reports that he has never smoked. He has never used smokeless tobacco. He reports that he does not drink alcohol or use drugs.  Review of Systems  Unable to perform ROS: Mental status change      VITAL SIGNS    Temp:  [97.2 F  (36.2 C)-98.5 F (36.9 C)] 98.3 F (36.8 C) (08/15 0900) Pulse Rate:  [45-93] 54 (08/15 1000) Resp:  [10-20] 13 (08/15 1000) BP: (78-137)/(49-85) 109/63 (08/15 1000) SpO2:  [91 %-98 %] 95 % (08/15 1000) FiO2 (%):  [99 %] 99 % (08/14 2008) Weight:  [81.5 kg] 81.5 kg (08/14 1220)   VENTILATOR SETTINGS: FiO2 (%):  [99 %] 99 % INTAKE / OUTPUT:  Intake/Output Summary (Last 24 hours) at 05/23/2018 1105 Last  data filed at 05/23/2018 0600 Gross per 24 hour  Intake 942.7 ml  Output 1080 ml  Net -137.3 ml       PHYSICAL EXAM   Physical Exam  Constitutional:  Elderly male, who appears stated age. Resting comfortably in hospital bed  HENT:  Head: Normocephalic and atraumatic.  Eyes: Pupils are equal, round, and reactive to light.  Unable to perform EOM  Neck: Neck supple.  Cardiovascular:  Regular rate and rhythm. Normal S1 and S2. No murmurs, rubs, gallops   Pulmonary/Chest:  Clear to auscultation bilaterally. No wheezes/rales/rhonchi  Abdominal:  Soft, non-distended, non-tender abdomen. Normoactive bowel sounds in all 4 quadrants  Musculoskeletal:  No swelling, clubbing, or edema.   Lymphadenopathy:    He has no cervical adenopathy.  Neurological:  Responds to name. He is alert and hard of hearing. Right sided facial droop noted. Follows intermittent commands. Grip strength L>R. Unable to perform full neurological exam secondary to altered mental status   Skin: Skin is warm and dry. Capillary refill takes less than 2 seconds. No erythema.       LABS   LABS:  CBC Recent Labs  Lab 05/23/18 0214  WBC 7.5  HGB 13.4  HCT 39.1*  PLT 145*   Coag's Recent Labs  Lab 05/22/18 1725 05/23/18 0214  APTT 29 89*   BMET Recent Labs  Lab 05/21/18 1047 05/23/18 0214  NA  --  140  K  --  4.2  CL  --  106  CO2  --  28  BUN 13 11  CREATININE 0.80 0.82  GLUCOSE  --  147*   Electrolytes Recent Labs  Lab 05/23/18 0214  CALCIUM 8.5*   Sepsis Markers No  results for input(s): LATICACIDVEN, PROCALCITON, O2SATVEN in the last 168 hours. ABG No results for input(s): PHART, PCO2ART, PO2ART in the last 168 hours. Liver Enzymes No results for input(s): AST, ALT, ALKPHOS, BILITOT, ALBUMIN in the last 168 hours. Cardiac Enzymes Recent Labs  Lab 05/22/18 0747  TROPONINI <0.03   Glucose Recent Labs  Lab 05/22/18 1221 05/22/18 1611 05/22/18 1957 05/22/18 2357 05/23/18 0402 05/23/18 0758  GLUCAP 152* 160* 134* 119* 133* 101*     Recent Results (from the past 240 hour(s))  MRSA PCR Screening     Status: None   Collection Time: 05/22/18 12:30 PM  Result Value Ref Range Status   MRSA by PCR NEGATIVE NEGATIVE Final    Comment:        The GeneXpert MRSA Assay (FDA approved for NASAL specimens only), is one component of a comprehensive MRSA colonization surveillance program. It is not intended to diagnose MRSA infection nor to guide or monitor treatment for MRSA infections. Performed at [facility], [address] [place], [place] [address]      Current Facility-Administered Medications:  .  acetaminophen (TYLENOL) tablet 325-650 mg, 325-650 mg, Oral, Q4H PRN **OR** acetaminophen (TYLENOL) suppository 325-650 mg, 325-650 mg, Rectal, Q4H PRN, Schnier, Dolores Lory, MD .  alum & mag hydroxide-simeth (MAALOX/MYLANTA) 200-200-20 MG/5ML suspension 15-30 mL, 15-30 mL, Oral, Q2H PRN, Schnier, Dolores Lory, MD .  apixaban (ELIQUIS) tablet 5 mg, 5 mg, Oral, BID, Schnier, Dolores Lory, MD .  aspirin tablet 162 mg, 162 mg, Oral, Daily, Wilhelmina Mcardle, MD, 162 mg at 05/23/18 1051 .  atorvastatin (LIPITOR) tablet 40 mg, 40 mg, Oral, q1800, Schnier, Dolores Lory, MD .  Derrill Memo ON 05/24/2018] clopidogrel (PLAVIX) tablet 75 mg, 75 mg, Oral, Daily, Schnier, Dolores Lory, MD .  dextrose 5 % and 0.9 % NaCl with KCl 20 mEq/L infusion, , Intravenous, Continuous, Schnier, Dolores Lory, MD, Last Rate: 75 mL/hr at 05/22/18 1122 .  diazepam (VALIUM) injection 2.5  mg, 2.5 mg, Intravenous, Q6H PRN, Schnier, Dolores Lory, MD, 2.5 mg at 05/22/18 2020 .  docusate sodium (COLACE) capsule 100 mg, 100 mg, Oral, Daily, Schnier, Dolores Lory, MD, 100 mg at 05/23/18 0906 .  DOPamine (INTROPIN) 800 mg in dextrose 5 % 250 mL (3.2 mg/mL) infusion, 0-12 mcg/kg/min, Intravenous, Titrated, Schnier, Dolores Lory, MD, Last Rate: 2.7 mL/hr at 05/23/18 0300, 2 mcg/kg/min at 05/23/18 0300 .  famotidine (PEPCID) IVPB 20 mg premix, 20 mg, Intravenous, Q12H, Schnier, Dolores Lory, MD, Stopped at 05/23/18 [phone] .  FLUoxetine (PROZAC) capsule 10 mg, 10 mg, Oral, Daily, Schnier, Dolores Lory, MD, 10 mg at 05/23/18 0906 .  fluticasone (FLONASE) 50 MCG/ACT nasal spray 2 spray, 2 spray, Each Nare, Daily, Schnier, Dolores Lory, MD, 2 spray at 05/23/18 0915 .  guaiFENesin-dextromethorphan (ROBITUSSIN DM) 100-10 MG/5ML syrup 15 mL, 15 mL, Oral, Q4H PRN, Schnier, Dolores Lory, MD .  heparin ADULT infusion 100 units/mL (25000 units/284mL sodium chloride 0.45%), 950 Units/hr, Intravenous, Continuous, Schnier, Dolores Lory, MD, Last Rate: 9.5 mL/hr at 05/22/18 1800, 950 Units/hr at 05/22/18 1800 .  hydrALAZINE (APRESOLINE) injection 5 mg, 5 mg, Intravenous, Q20 Min PRN, Schnier, Dolores Lory, MD .  labetalol (NORMODYNE,TRANDATE) injection 10 mg, 10 mg, Intravenous, Q10 min PRN, Schnier, Dolores Lory, MD .  latanoprost (XALATAN) 0.005 % ophthalmic solution 1 drop, 1 drop, Both Eyes, QHS, Schnier, Dolores Lory, MD, 1 drop at 05/22/18 2255 .  loratadine (CLARITIN) tablet 10 mg, 10 mg, Oral, Daily, Schnier, Dolores Lory, MD, 10 mg at 05/23/18 0906 .  metoprolol succinate (TOPROL-XL) 24 hr tablet 12.5 mg, 12.5 mg, Oral, Daily, Schnier, Dolores Lory, MD .  metoprolol tartrate (LOPRESSOR) injection 2-5 mg, 2-5 mg, Intravenous, Q2H PRN, Schnier, Dolores Lory, MD .  morphine 4 MG/ML injection 2-5 mg, 2-5 mg, Intravenous, Q1H PRN, Delana Meyer, Dolores Lory, MD, 4 mg at 05/23/18 0051 .  nitroGLYCERIN (NITROSTAT) SL tablet 0.4 mg, 0.4 mg, Sublingual, Q5 min  PRN, Schnier, Dolores Lory, MD .  ondansetron [facility]) injection 4 mg, 4 mg, Intravenous, Q6H PRN, Schnier, Dolores Lory, MD, 4 mg at 05/22/18 1612 .  phenol (CHLORASEPTIC) mouth spray 1 spray, 1 spray, Mouth/Throat, PRN, Schnier, Dolores Lory, MD .  promethazine (PHENERGAN) injection 12.5 mg, 12.5 mg, Intravenous, Once, Schnier, Dolores Lory, MD .  traMADol Veatrice Bourbon) tablet 50 mg, 50 mg, Oral, Q6H PRN, Schnier, Dolores Lory, MD .  valproate (DEPACON) 250 mg in dextrose 5 % 50 mL IVPB, 250 mg, Intravenous, Q12H, Schnier, Dolores Lory, MD, Stopped at 05/22/18 1935  IMAGING    No results found.    INDWELLING DEVICES:: 2 peripheral IV's  MICRO DATA: MRSA PCR: negative   ANTIMICROBIALS: Cefazolin    ASSESSMENT/PLAN   84  Year old male who is s/p right common and internal carotid stent placement. PCCM asked to consult on patient secondary to his acute mental status changes and neurological deficits. It is difficult on exam to elicit what the patient's baseline neurological deficits are.   Assessment: -Bilateral carotid Stenosis, s/p stent placement of the Right Common and Internal carotid  -History of CVA (May and June) -Acute ICU Delirium   Plan: -vascular management per Dr. Ronalee Richardson   -continue aspirin, plavix  -continue heparin drip, consider switch to PO eliquis when able  -Hydralazine and Labetalol for PRN hypertension  -  continue metoprolol scheduled and PRN -acute delirium  -continue depacon  -ensure adequate pain control as this can contribute to delirium   -avoid the use of benzodiazepines in setting of acute delirium  -encourage room lights on during the day and off at night and normal  sleep/wake cycle  -if sedation is needed, consider precedex -Will order CT head w/o contrast to evaluate for acute intracranial abnormality  -consider MRI -consider further neurological work up and consult

## 2018-05-23 NOTE — Progress Notes (Signed)
Pt received one dose of morphine this afternoon after valium did not help his agitation, pt calmed down and rested for a couple of hours. Pt still with expressive aphasia and per sitter at bedside pt is always restless at home and takes hours to finally get to sleep at night. Per sitter, pt used to take care of his wife and son prior to stroke that occurred in May, so he was very active. Pt now resting, will continue to monitor.

## 2018-05-24 ENCOUNTER — Inpatient Hospital Stay: Payer: PPO

## 2018-05-24 DIAGNOSIS — R410 Disorientation, unspecified: Secondary | ICD-10-CM

## 2018-05-24 DIAGNOSIS — R4182 Altered mental status, unspecified: Secondary | ICD-10-CM

## 2018-05-24 MED ORDER — FAMOTIDINE 20 MG PO TABS
20.0000 mg | ORAL_TABLET | Freq: Two times a day (BID) | ORAL | Status: DC
  Administered 2018-05-24 – 2018-05-31 (×15): 20 mg via ORAL
  Filled 2018-05-24 (×15): qty 1

## 2018-05-24 MED ORDER — MAGNESIUM HYDROXIDE 400 MG/5ML PO SUSP
60.0000 mL | Freq: Once | ORAL | Status: AC
  Administered 2018-05-24: 60 mL via ORAL
  Filled 2018-05-24: qty 60

## 2018-05-24 MED ORDER — BISACODYL 10 MG RE SUPP
10.0000 mg | Freq: Once | RECTAL | Status: AC
  Administered 2018-05-24: 10 mg via RECTAL
  Filled 2018-05-24: qty 1

## 2018-05-24 NOTE — Progress Notes (Signed)
Follow up - Critical Care Medicine Note  Patient Details:    Kyle Richardson is an 84 y.o. male.gentleman with a past medical history remarkable for coronary artery disease, hypertension, hypercholesterolemia, CVA, objective sleep apnea, glaucoma, diabetes mellitus, status post carotid stent On right with presentation of altered mental status.  Lines, Airways, Drains:    Anti-infectives:  Anti-infectives (From admission, onward)   Start     Dose/Rate Route Frequency Ordered Stop   05/22/18 1600  ceFAZolin (ANCEF) IVPB 2g/100 mL premix     2 g 200 mL/hr over 30 Minutes Intravenous Every 8 hours 05/22/18 1320 05/23/18 0028   05/21/18 2145  ceFAZolin (ANCEF) IVPB 2g/100 mL premix     2 g 200 mL/hr over 30 Minutes Intravenous  Once 05/21/18 2131 05/22/18 0857      Microbiology: Results for orders placed or performed during the hospital encounter of 05/22/18  MRSA PCR Screening     Status: None   Collection Time: 05/22/18 12:30 PM  Result Value Ref Range Status   MRSA by PCR NEGATIVE NEGATIVE Final    Comment:        The GeneXpert MRSA Assay (FDA approved for NASAL specimens only), is one component of a comprehensive MRSA colonization surveillance program. It is not intended to diagnose MRSA infection nor to guide or monitor treatment for MRSA infections. Performed at [facility], [place], [place] [address]    Events:   Studies: Ct Head Wo Contrast  Result Date: 05/23/2018 CLINICAL DATA:  Onset of increased confusion over baseline this morning. EXAM: CT HEAD WITHOUT CONTRAST TECHNIQUE: Contiguous axial images were obtained from the base of the skull through the vertex without intravenous contrast. COMPARISON:  Head CT scan 04/20/2018.  Brain MRI 04/15/2018. FINDINGS: Brain: No evidence of acute infarction, hemorrhage, hydrocephalus, extra-axial collection or mass lesion/mass effect. Atrophy, chronic microvascular ischemic change and left PCA  territory infarct are all again seen. Vascular: Extensive atherosclerosis is noted. Skull: Intact and normal appearance. Sinuses/Orbits: No acute finding. Status post bilateral lens extraction. Other: None. IMPRESSION: No acute abnormality. Atrophy, chronic microvascular ischemic change and remote left PCA infarct. Electronically Signed   By: Inge Rise M.D.   On: 05/23/2018 12:38    Consults:    Subjective:    Overnight Issues: Patient has been confused overnight, presently sleeping  Objective:  Vital signs for last 24 hours: Temp:  [97.2 F (36.2 C)-98.5 F (36.9 C)] 98.1 F (36.7 C) (08/16 0834) Pulse Rate:  [45-64] 53 (08/16 0800) Resp:  [8-21] 12 (08/16 0800) BP: (90-149)/(47-98) 115/56 (08/16 0800) SpO2:  [88 %-100 %] 100 % (08/16 0600) Weight:  [77.4 kg] 77.4 kg (08/15 1845)  Hemodynamic parameters for last 24 hours:    Intake/Output from previous day: 08/15 0701 - 08/16 0700 In: 2965 [P.O.:120; I.V.:2692.5; IV Piggyback:152.5] Out: 425 [Urine:425]  Intake/Output this shift: Total I/O In: -  Out: 300 [Urine:300]  Vent settings for last 24 hours:    Physical Exam:   Physical Exam  Constitutional:  Elderly male, who appears stated age. Presently sleeping HEENT: Trachea midline, no accessory muscle utilization, no thyromegaly noted,  Neck: Neck supple.  Cardiovascular:  Regular rate and rhythm. Normal S1 and S2. No murmurs, rubs, gallops Pulmonary: Clear to auscultation Abdominal: Soft, non-distended, non-tender abdomen. Normoactive bowel sounds in all 4 quadrants  Musculoskeletal:  No swelling, clubbing, or edema. .  Neurological: Responds to name.  Right sided facial droop noted. Follows intermittent commands. Unable to perform full  neurological exam secondary to altered mental status   Skin: Skin is warm and dry. Capillary refill takes less than 2 seconds. No erythema.    Assessment/Plan:   85  Year old male who is s/p right common and internal  carotid stent placement. patient does have a prior history of multiple CVAs.CT scan of the head performed revealed from remote left PCA infarct with some microvascular changes. Patient still somnolent and confused. Would consult neurology, recommendations regarding MRI or further workup.  Hermelinda Dellen, DO

## 2018-05-24 NOTE — Progress Notes (Signed)
PHARMACIST - PHYSICIAN COMMUNICATION  CONCERNING: IV to Oral Route Change Policy  RECOMMENDATION: This patient is receiving famotidine by the intravenous route.  Based on criteria approved by the Pharmacy and Therapeutics Committee, the intravenous medication(s) is/are being converted to the equivalent oral dose form(s).   DESCRIPTION: These criteria include:  The patient is eating (either orally or via tube) and/or has been taking other orally administered medications for a least 24 hours  The patient has no evidence of active gastrointestinal bleeding or impaired GI absorption (gastrectomy, short bowel, patient on TNA or NPO).  If you have questions about this conversion, please contact the Pharmacy Department  []   [phone] )  [facility] [x]   [phone] )  [facility] []   [phone] )  [facility] []   [phone] )  [facility] []   [phone] )  [place], [facility] 05/24/2018 9:25 AM

## 2018-05-24 NOTE — Evaluation (Signed)
Occupational Therapy Evaluation Patient Details Name: Kyle Richardson MRN: [id number] DOB: February 05, 1928 Today's Date: 05/24/2018    History of Present Illness Pt is an 84 yo male s/p R Carotid stent placement. The carotid stenosis was identified in July when he was brought to the emergency room with right arm weakness and right eye visual changes.  At that time it was identified that he had had a 35mm infarct to R temporal lobe, subacute/chronic infarcts to L PCA, L BG and L thalamus. pt was admitted to CIR at the time and made great improvement nearing baseline functioning. Since procedure pt has been very confused and agitiated. Pt neuro deficits are returning to levels pre CIR.    Clinical Impression   OT/PT co-evaluation completed this date 2/2 to pt's impairments, pt/therapist safety, and to address functional mobility/transfers and self care. Pt drowsy, uncomfortable and slightly agitated, keeping eyes closed most of the session, requiring max cues to open briefly. Pt is oriented to self only. Pt visually and verbally expresses high levels of discomfort in R shoulder, and expresses pain with raising R arm above 90 degrees. Pt's family denies any trauma to R shoulder. Pt inconsistently follows commands and has difficult orienting to voices/physical cues, in particular on R side. Pt shows R sided homonymous hemianopsia consistent with his previous deficits. Pt has some R sided facial droop. Pt aphasiac. Difficult to formally assess strength and muscle activation 2/2 difficulty with following commands and agitation/confusion. Hearing aides noted to be displaced. Reapplied with pt indicating improved hearing. Pt required max assist 2+ for transfer from supine to sit. PR providing assist for sitting balance once EOB (please see PT note). Pt demonstrated difficulty with participation in seated grooming task with max cues and requiring max assist to wash face.  Pt is currently unable to maintain midline without  max assist 2+ from therapist. Pt noted to be pushing to the R with PT while sitting and with posterior lean. Pt verbalizes fear of falling forward. Pt demonstrates some grip in R hand but is unable to shift weight froward in RUE reaching activity without max assist from therapist. Pt has hx of fast and positive response to therapy with regards to his functional deficits. His current neurological deficits are in line with his impairments before therapeutic intervention in past, but much more severe compare to his functioning prior to this hospitilization . Pt would benefit from resuming skilled therapeutic intervention to return previous gains and move pt towards most recent baseline function. Recommend transition to acute inpatient rehab upon discharge for high-intensity, post-acute rehab services.    Follow Up Recommendations  CIR    Equipment Recommendations  Other (comment)(TBD)    Recommendations for Other Services Rehab consult     Precautions / Restrictions Precautions Precautions: Fall Restrictions Weight Bearing Restrictions: No Other Position/Activity Restrictions: R sided weakness. R shoulder pain with movement above 90 degrees.      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 Pt unable  to amaintain midline without max assist 2+. Lateral and post trunk support, as well as post neck support.  Postural control: Posterior lean;Right lateral lean                                  ADL either performed or assessed with clinical judgement   ADL Overall ADL's : Needs assistance/impaired     Grooming: Sitting;Maximal assistance;Cueing for sequencing   Upper Body Bathing: Maximal assistance;Bed level;Total assistance   Lower Body Bathing: Bed level;Maximal assistance;Total assistance   Upper Body Dressing : Bed level;Total assistance;Maximal assistance   Lower Body Dressing: Bed level;Maximal assistance;Total assistance                       Vision   Additional Comments: unable to formally assess, recent hx of R visual field cut     Perception     Praxis      Pertinent Vitals/Pain Pain Assessment: Faces Faces Pain Scale: Hurts whole lot Pain Location: R shoulder Pain Descriptors / Indicators: Moaning;Grimacing;Guarding Pain Intervention(s): Limited activity within patient's tolerance;Monitored during session;Repositioned     Hand Dominance Right   Extremity/Trunk Assessment Upper Extremity Assessment Upper Extremity Assessment: RUE deficits/detail RUE Deficits / Details: Shoulder pain with all ROM. Pain and temperature sensation present but reduced. Pt family denies any trauma to RUE RUE: Unable to fully assess due to pain RUE Sensation: decreased light touch RUE Coordination: decreased fine motor;decreased gross motor   Lower Extremity Assessment Lower Extremity Assessment: RLE deficits/detail RLE Deficits / Details: unable to actviely move on command due to cognititve difficulties. Does show activation and movement agaisnt gravity periodically., with writhing and repositioning.  RLE Sensation: decreased light touch;decreased proprioception       Communication Communication Communication: Expressive difficulties(Confrontational> Conversational)   Cognition Arousal/Alertness: Lethargic Behavior During Therapy: Agitated;Restless Overall Cognitive Status: Impaired/Different from baseline Area of Impairment:  Orientation;Attention;Awareness;Problem solving;Following commands                 Orientation Level: Person Current Attention Level: Alternating   Following Commands: Follows one step commands inconsistently     Problem Solving: Slow processing;Requires tactile cues;Requires verbal cues;Difficulty sequencing;Decreased initiation     General Comments       Exercises    Shoulder Instructions      Home Living Family/patient expects to be discharged to:: Private residence Living Arrangements: Spouse/significant other Available Help at Discharge: Family;Personal care attendant;Available 24 hours/day Type of Home: House Home Access: Stairs to enter [employer] of Steps: 2 Entrance Stairs-Rails: Can reach both Home Layout: One level     Bathroom Shower/Tub: Occupational psychologist: Standard     Home Equipment: Grab bars - tub/shower;Shower seat;Wheelchair - manual          Prior Functioning/Environment Level of Independence: Needs assistance  Gait / Transfers Assistance Needed: ambulatory with assist, and was receiving Home health PT, OT and speech since previous admission ADL's / Homemaking Assistance Needed: pt requires assist for toileting, showering, meals, and housekeeping tasks from CNAs. Spouse assists with medication mgt. Communication / Swallowing Assistance Needed: mild expressive difficulties from previous CVA          OT Problem List: Decreased strength;Decreased cognition;Decreased activity tolerance;Decreased range of motion;Pain;Impaired sensation;Impaired vision/perception;Decreased knowledge of use of DME or AE;Decreased coordination;Impaired UE functional use;Impaired balance (sitting and/or standing);Decreased safety awareness      OT Treatment/Interventions: Self-care/ADL training;Balance  training;Therapeutic exercise;Therapeutic activities;Neuromuscular education;DME and/or AE instruction;Cognitive  remediation/compensation;Visual/perceptual remediation/compensation;Patient/family education;Manual therapy    OT Goals(Current goals can be found in the care plan section) Acute Rehab OT Goals Patient Stated Goal: pt unable to state, spouse wants pt to return to PLOF OT Goal Formulation: With patient/family Time For Goal Achievement: 06/07/18 Potential to Achieve Goals: Good ADL Goals Pt Will Perform Grooming: with mod assist;sitting Additional ADL Goal #1: Pt will perform seated table top ADL activity with mod assist for sitting balance and RUE involvement with mod verbal cues. Additional ADL Goal #2: Pt will perform bed mobility with max cues for sequencing and mod-max assist +1.  OT Frequency: Min 3X/week   Barriers to D/C:            Co-evaluation PT/OT/SLP Co-Evaluation/Treatment: Yes Reason for Co-Treatment: Complexity of the patient's impairments (multi-system involvement);Necessary to address cognition/behavior during functional activity;For patient/therapist safety;To address functional/ADL transfers PT goals addressed during session: Mobility/safety with mobility;Balance OT goals addressed during session: ADL's and self-care      AM-PAC PT "6 Clicks" Daily Activity     Outcome Measure Help from another person eating meals?: Total Help from another person taking care of personal grooming?: A Lot Help from another person toileting, which includes using toliet, bedpan, or urinal?: Total Help from another person bathing (including washing, rinsing, drying)?: Total Help from another person to put on and taking off regular upper body clothing?: Total Help from another person to put on and taking off regular lower body clothing?: Total 6 Click Score: 7   End of Session    Activity Tolerance: Patient limited by pain;Patient limited by lethargy;Treatment limited secondary to agitation Patient left: in bed;with call bell/phone within reach;with bed alarm set;with family/visitor  present  OT Visit Diagnosis: Other abnormalities of gait and mobility (R26.89);Hemiplegia and hemiparesis;Low vision, both eyes (H54.2);Other symptoms and signs involving cognitive function Hemiplegia - Right/Left: Right Hemiplegia - dominant/non-dominant: Dominant Hemiplegia - caused by: Cerebral infarction                Time: 2751-7001 OT Time Calculation (min): 31 min Charges:  OT General Charges $OT Visit: 1 Visit OT Evaluation $OT Eval High Complexity: 1 High  Jeni Salles, MPH, MS, OTR/L ascom [phone] 05/24/18, 2:58 PM

## 2018-05-24 NOTE — Progress Notes (Signed)
Patient voided 175 ml this shift. Bladder scan shows 395 ml. Pt continued to sleep as bladder scan completed. Day shift RN at bedside for report and states that she will in/out him if pt does not void upon awakening this morning.

## 2018-05-24 NOTE — Consult Note (Addendum)
Reason for Consult:AMS Referring Physician: Conforti  CC: AMS  HPI: Kyle Richardson is an 84 y.o. male who is unable to provide any history due to his mental status.  Patient underwent right carotid stent placement 2 days ago.  Was seen by neurology on 7/9 during that hospitalization for stroke.  Has h/o left PCA infarct in May of this year.  Presented in July with confusion.  At that time with an acute right temporal infarct on Eliquis.  Was unable to follow commands and non-fluent.    Past Medical History:  Diagnosis Date  . Abdominal fibromatosis   . 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  s/p PTCA of the mid RCA 1/96, also  s/p  CABG x 4 7/96  . Complication of anesthesia    confusion and hallucinations for several days after receiving Versed  . Degenerative disc disease   . Diabetes mellitus ([place])   . Glaucoma   . Hypercholesterolemia   . Hypertension   . Migraine headache    history of  . Myocardial infarction ([place]) 1996  . Osteoarthritis   . Sleep apnea   . Stroke [facility]) 02/22/2017   affected left side     Past Surgical History:  Procedure Laterality Date  . APPENDECTOMY    . CAROTID ANGIOGRAPHY Bilateral 04/19/2018   Procedure: CAROTID ANGIOGRAPHY;  Surgeon: Algernon Huxley, MD;  Location: [facility];  Service: Cardiovascular;  Laterality: Bilateral;  . CAROTID PTA/STENT INTERVENTION Right 05/22/2018   Procedure: CAROTID PTA/STENT INTERVENTION;  Surgeon: Katha Cabal, MD;  Location: [facility];  Service: Cardiovascular;  Laterality: Right;  . CATARACT EXTRACTION W/ INTRAOCULAR LENS  IMPLANT, BILATERAL    . CHOLECYSTECTOMY     Removed  . CORONARY ARTERY BYPASS GRAFT  7/96   x4  . HEMORRHOID SURGERY    . HERNIA REPAIR    . TONSILLECTOMY    . UPP and tonsillectomy  1/86    Family History  Problem Relation Age of Onset  . Heart disease Mother        died age 67  . Heart disease Father        and other  vascular disease  . Diabetes Unknown        four siblings  . Heart disease Brother        s/p CABG  . Colon cancer Neg Hx   . Prostate cancer Neg Hx     Social History:  reports that he has never smoked. He has never used smokeless tobacco. He reports that he does not drink alcohol or use drugs.  Allergies  Allergen Reactions  . Clarithromycin Diarrhea  . Codeine Other (See Comments)    Unknown  . Flomax [Tamsulosin Hcl] Other (See Comments)    Unknown  . Lamisil [Terbinafine] Other (See Comments)    Unknown  . Levofloxacin Other (See Comments)    Diplopia. NEVER put patient on levoquin!  . Sertraline Other (See Comments)    Unknown  . Sulfa Antibiotics Other (See Comments)    Unknown  . Versed [Midazolam] Other (See Comments)    Hallucinations and confusion    Medications:  I have reviewed the patient's current medications. Prior to Admission:  Medications Prior to Admission  Medication Sig Dispense Refill Last Dose  . acetaminophen (TYLENOL) 500 MG tablet Take 1 tablet (500 mg total) by mouth every 6 (six) hours as needed. (Patient taking differently: Take 500 mg by  mouth every 6 (six) hours as needed for moderate pain or headache. ) 30 tablet 0 Past Month at Unknown time  . apixaban (ELIQUIS) 5 MG TABS tablet Take 1 tablet (5 mg total) by mouth 2 (two) times daily. 30 tablet 1 05/21/2018 at Unknown time  . atorvastatin (LIPITOR) 40 MG tablet Take 1 tablet (40 mg total) by mouth daily. (Patient taking differently: Take 40 mg by mouth at bedtime. ) 30 tablet 1 05/21/2018 at Unknown time  . divalproex (DEPAKOTE ER) 500 MG 24 hr tablet Take 1 tablet (500 mg total) by mouth daily. (Patient taking differently: Take 500 mg by mouth at bedtime. ) 30 tablet 1 05/21/2018 at Unknown time  . FLUoxetine (PROZAC) 10 MG capsule Take 1 capsule (10 mg total) by mouth daily. 30 capsule 3 05/22/2018 at Unknown time  . fluticasone (FLONASE) 50 MCG/ACT nasal spray SPRAY 2 SPRAYS IN EACH NOSTRIL  ONCE A DAY (Patient taking differently: SPRAY 1 SPRAY IN EACH NOSTRIL ONCE A DAY AT BEDTIME) 16 g 2 05/21/2018 at Unknown time  . latanoprost (XALATAN) 0.005 % ophthalmic solution Place 1 drop into both eyes at bedtime.   05/22/2018 at Unknown time  . loratadine (CLARITIN) 10 MG tablet Take 10 mg by mouth daily.   05/22/2018 at Unknown time  . metoprolol succinate (TOPROL-XL) 25 MG 24 hr tablet Take 0.5 tablets (12.5 mg total) by mouth daily. 30 tablet 1 05/22/2018 at Unknown time  . Multiple Vitamins-Minerals (PRESERVISION AREDS 2 PO) Take 1 capsule by mouth 2 (two) times daily.   05/22/2018 at Unknown time  . nitroGLYCERIN (NITROSTAT) 0.4 MG SL tablet Place 0.4 mg under the tongue every 5 (five) minutes as needed for chest pain.    Past Month at Unknown time   Scheduled: . apixaban  5 mg Oral BID  . aspirin  162 mg Oral Daily  . atorvastatin  40 mg Oral q1800  . clopidogrel  75 mg Oral Daily  . docusate sodium  100 mg Oral Daily  . famotidine  20 mg Oral BID  . FLUoxetine  10 mg Oral Daily  . fluticasone  2 spray Each Nare Daily  . latanoprost  1 drop Both Eyes QHS  . loratadine  10 mg Oral Daily  . metoprolol succinate  12.5 mg Oral Daily    ROS: Unable to provide due to mental status  Physical Examination: Blood pressure 135/62, pulse 66, temperature 98.1 F (36.7 C), temperature source Oral, resp. rate 13, height 5\' 9"  (1.753 m), weight 77.4 kg, SpO2 99 %.  Unable to examine extensively due to aggressive and combative behavior.  Patient extensively agitated due to my presence.    Neurological Examination  Mental Status: Alert. Confused.  Combative. Agitated Cranial Nerves: No facial asymmetry noted. Motor: Moves both upper extremities against gravity.     Laboratory Studies:   Basic Metabolic Panel: Recent Labs  Lab 05/21/18 1047 05/23/18 0214  NA  --  140  K  --  4.2  CL  --  106  CO2  --  28  GLUCOSE  --  147*  BUN 13 11  CREATININE 0.80 0.82  CALCIUM  --  8.5*     Liver Function Tests: No results for input(s): AST, ALT, ALKPHOS, BILITOT, PROT, ALBUMIN in the last 168 hours. No results for input(s): LIPASE, AMYLASE in the last 168 hours. No results for input(s): AMMONIA in the last 168 hours.  CBC: Recent Labs  Lab 05/23/18 0214  WBC 7.5  HGB 13.4  HCT 39.1*  MCV 95.9  PLT 145*    Cardiac Enzymes: Recent Labs  Lab 05/22/18 0747  TROPONINI <0.03    BNP: Invalid input(s): POCBNP  CBG: Recent Labs  Lab 05/22/18 1611 05/22/18 1957 05/22/18 2357 05/23/18 0402 05/23/18 0758  GLUCAP 160* 134* 119* 133* 101*    Microbiology: Results for orders placed or performed during the hospital encounter of 05/22/18  MRSA PCR Screening     Status: None   Collection Time: 05/22/18 12:30 PM  Result Value Ref Range Status   MRSA by PCR NEGATIVE NEGATIVE Final    Comment:        The GeneXpert MRSA Assay (FDA approved for NASAL specimens only), is one component of a comprehensive MRSA colonization surveillance program. It is not intended to diagnose MRSA infection nor to guide or monitor treatment for MRSA infections. Performed at [facility], [address] [place], [place] [address]     Coagulation Studies: No results for input(s): LABPROT, INR in the last 72 hours.  Urinalysis: No results for input(s): COLORURINE, LABSPEC, PHURINE, GLUCOSEU, HGBUR, BILIRUBINUR, KETONESUR, PROTEINUR, UROBILINOGEN, NITRITE, LEUKOCYTESUR in the last 168 hours.  Invalid input(s): APPERANCEUR  Lipid Panel:     Component Value Date/Time   CHOL 159 04/16/2018 0558   CHOL 129 08/13/2013 1026   TRIG 288 (H) 04/16/2018 0558   TRIG 166 08/13/2013 1026   HDL 35 (L) 04/16/2018 0558   HDL 47 08/13/2013 1026   CHOLHDL 4.5 04/16/2018 0558   VLDL 58 (H) 04/16/2018 0558   VLDL 33 08/13/2013 1026   LDLCALC 66 04/16/2018 0558   LDLCALC 49 08/13/2013 1026    HgbA1C:  Lab Results  Component Value Date   HGBA1C 6.8 (H) 04/16/2018     Urine Drug Screen:  No results found for: LABOPIA, COCAINSCRNUR, LABBENZ, AMPHETMU, THCU, LABBARB  Alcohol Level: No results for input(s): ETH in the last 168 hours.  Other results: EKG: sinus rhythm at 69 bpm with PVC's.  Imaging: Ct Head Wo Contrast  Result Date: 05/23/2018 CLINICAL DATA:  Onset of increased confusion over baseline this morning. EXAM: CT HEAD WITHOUT CONTRAST TECHNIQUE: Contiguous axial images were obtained from the base of the skull through the vertex without intravenous contrast. COMPARISON:  Head CT scan 04/20/2018.  Brain MRI 04/15/2018. FINDINGS: Brain: No evidence of acute infarction, hemorrhage, hydrocephalus, extra-axial collection or mass lesion/mass effect. Atrophy, chronic microvascular ischemic change and left PCA territory infarct are all again seen. Vascular: Extensive atherosclerosis is noted. Skull: Intact and normal appearance. Sinuses/Orbits: No acute finding. Status post bilateral lens extraction. Other: None. IMPRESSION: No acute abnormality. Atrophy, chronic microvascular ischemic change and remote left PCA infarct. Electronically Signed   By: Inge Rise M.D.   On: 05/23/2018 12:38   Dg Shoulder Right Port  Result Date: 05/24/2018 CLINICAL DATA:  Right shoulder and hip pain EXAM: PORTABLE RIGHT SHOULDER COMPARISON:  None. FINDINGS: There is no evidence of fracture or dislocation. There is a small calcification adjacent to the greater tuberosity which may reflect calcific tendinosis or calcific bursitis. There is no evidence of arthropathy or other focal bone abnormality. Soft tissues are unremarkable. IMPRESSION: No acute osseous injury of the right shoulder. Electronically Signed   By: Kathreen Devoid   On: 05/24/2018 14:20   Dg Hip Port Unilat With Pelvis 1v Right  Result Date: 05/24/2018 CLINICAL DATA:  84 year old male with acute right hip pain today. No known injury. EXAM: DG HIP (WITH OR WITHOUT PELVIS) 1V  PORT RIGHT COMPARISON:  CT Abdomen and  Pelvis 02/15/2014. FINDINGS: Bone mineralization is within normal limits for age. Femoral heads are normally located. Hip joint spaces appear stable since 2015, symmetric, and within normal limits. Pelvis appears intact. Chronic pelvic phleboliths. Negative visible bowel gas pattern. Calcified bilateral femoral artery atherosclerosis. Grossly intact proximal left femur. The proximal right femur appears intact with dystrophic and/or vascular calcifications about the proximal right femur. IMPRESSION: No acute osseous abnormality identified about the right hip or pelvis. Electronically Signed   By: Genevie Ann M.D.   On: 05/24/2018 14:20     Assessment/Plan: 84 year old male with recent infarcts now 2 days s/p right carotid stent placement.  Patient with history of worsening cognition from last hospitalization.  Current presentation may represent continued progression of cognitive issues that have been escalated by hospitalization and recent procedure.  Can not rule out recurrent infarct as well since patient has a history of strokes on anticoagulation.   Head CT shows no acute changes.  Lab work unremarkable.  Recommendations: 1. Patient will require MRI of the brain but due to cognitive status unable to tolerate at this time and likely will need to wait some time due to recent stent placement as well.   2. Patient to remain on anticoagulation.   3. Will continue to follow with you  Alexis Goodell, MD Neurology [phone] 05/24/2018, 8:19 PM

## 2018-05-24 NOTE — Progress Notes (Signed)
Inpatient Rehabilitation  Per PT and OT request, patient was screened by Gunnar Fusi for appropriateness for an Acute Inpatient Rehab consult.  Noted issues with delirium and initial therapy evaluations.  Plan to follow-up Monday, 05/27/18 for therapy progress over the weekend.  Of note, patient is known to me from previous admission on 04/15/18 and during this stay [employer] denied coverage for an IP Rehab admission.    Carmelia Roller., CCC/SLP Admission Coordinator  [facility]  Cell [phone]

## 2018-05-24 NOTE — Evaluation (Signed)
Physical Therapy Evaluation Patient Details Name: Kyle Richardson MRN: [id number] DOB: [birth year] Today's Date: 05/24/2018   History of Present Illness  Pt is an 84 yo male s/p R Carotid stent placement. The carotid stenosis was identified in July when he was brought to the emergency room with right arm weakness and right eye visual changes.  At that time it was identified that he had had a 64mm infarct to R temporal lobe, subacute/chronic infarcts to L PCA, L BG and L thalamus. Since procedure pt has been very confused and agitiated.    Clinical Impression  On PT arrival pt was drowsy, uncomfortable and slightly agitated. Pt is oriented to self only. Pt expresses high levels of discomfort in R shoulder, and expresses pain with raising R arm above 90 degrees. Pt family denies any trauma to R shoulder.  Pt inconsistently follows commands and has difficult orienting to voices/physical cues, in particular on R side. Pt shows R sided homonymous hemianopsia consistent with his previous deficits. Pt has some R sided facial droop. Pt aphasiac (details below).  It is difficult to assess pt strength and muscle activation due to pt difficulty with following commands and agitation/confusion. Pt required max assist 2+ for transfer from supine to sit. Pt is currently unable to maintain midline without max assist 2+ from therapist. 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 Pt demonstrates some grip in R hand but is unable to shift weight froward in RUE reaching activity without max assist from therapist. Pt has hx of fast and positive response to therapy with regards to his functional deficits. His current neurological deficits are in line with his impairments before therapeutic intervention in past, but much more severe compare to his functioning prior to this hospitilization . Pt would benefit from resuming skilled therapeutic intervention to return previous gains and move pt towards  most recent baseline function. Recommend transition to acute inpatient rehab upon discharge for high-intensity, post-acute rehab services.     Follow Up Recommendations CIR    Equipment Recommendations  Rolling walker with 5" wheels    Recommendations for Other Services       Precautions / Restrictions Precautions Precautions: Fall Restrictions Weight Bearing Restrictions: No Other Position/Activity Restrictions: R sided weakness. R shoulder pain with movement above 90 degrees.      Mobility  Bed Mobility Overal bed mobility: Needs Assistance Bed Mobility: Supine to Sit     Supine to sit: +2 for physical assistance;Max assist     General bed mobility comments: see PT notes for details  Transfers                 General transfer comment: Unable to assess today--pt not appropriate to stand currerently   Ambulation/Gait             General Gait Details: Unable to assess today--pt not appropriate to stand/walk currerently   Stairs            Wheelchair Mobility    Modified Rankin (Stroke Patients Only)       Balance Overall balance assessment: Needs assistance Sitting-balance support: Feet supported;Bilateral upper extremity supported Sitting balance-Leahy Scale: Zero Sitting balance - Comments: 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 Pt unable to amaintain midline without max assist 2+. Lateral and post trunk support, as well as post neck support.  Postural control: Posterior lean;Right lateral lean  Pertinent Vitals/Pain Pain Assessment: Faces Faces Pain Scale: Hurts whole lot Pain Location: R shoulder Pain Descriptors / Indicators: Moaning;Grimacing;Guarding Pain Intervention(s): Limited activity within patient's tolerance;Monitored during session;Repositioned    Home Living Family/patient expects to be discharged to:: Private residence Living Arrangements: Spouse/significant other Available Help at  Discharge: Family;Personal care attendant;Available 24 hours/day Type of Home: House Home Access: Stairs to enter Entrance Stairs-Rails: Can reach both Entrance Stairs-Number of Steps: 2 Home Layout: One level Home Equipment: Grab bars - tub/shower;Shower seat;Wheelchair - manual      Prior Function Level of Independence: Needs assistance   Gait / Transfers Assistance Needed: ambulatory with assist, and was receiving Home health PT, OT and speech since previous admission  ADL's / Homemaking Assistance Needed: pt requires assist for toileting, showering, meals, and housekeeping tasks from CNAs. Spouse assists with medication mgt.        Hand Dominance   Dominant Hand: Right    Extremity/Trunk Assessment   Upper Extremity Assessment Upper Extremity Assessment: RUE deficits/detail RUE Deficits / Details: Shoulder pain with all ROM. Pain and temperature sensation present but reduced. Pt family denies any trauma to RUE RUE: Unable to fully assess due to pain RUE Sensation: decreased light touch RUE Coordination: decreased fine motor;decreased gross motor    Lower Extremity Assessment Lower Extremity Assessment: RLE deficits/detail RLE Deficits / Details: unable to actviely move on command due to cognititve difficulties. Does show activation and movement agaisnt gravity periodically., with writhing and repositioning.  RLE Sensation: decreased light touch;decreased proprioception       Communication   Communication: Expressive difficulties(Confrontational> Conversational)  Cognition Arousal/Alertness: Lethargic Behavior During Therapy: Agitated;Restless Overall Cognitive Status: Impaired/Different from baseline Area of Impairment: Orientation;Attention;Awareness;Problem solving;Following commands                 Orientation Level: Person Current Attention Level: Alternating   Following Commands: Follows one step commands inconsistently     Problem Solving: Slow  processing;Requires tactile cues;Requires verbal cues;Difficulty sequencing;Decreased initiation        General Comments      Exercises Other Exercises Other Exercises: Supine to sit max assist 2+; sitting balance and reaching activities--pt required max assist to participate but was able to demonstrate some understanding of activity's goal. Limited by fear of sliding off bed due to discomfort with being corrected into midline.    Assessment/Plan    PT Assessment Patient needs continued PT services  PT Problem List Decreased strength;Decreased balance;Decreased cognition;Pain;Decreased knowledge of use of DME;Decreased mobility;Decreased activity tolerance;Decreased range of motion;Decreased coordination;Decreased safety awareness;Impaired sensation       PT Treatment Interventions DME instruction;Gait training;Stair training;Therapeutic exercise;Therapeutic activities;Functional mobility training;Balance training;Neuromuscular re-education;Patient/family education    PT Goals (Current goals can be found in the Care Plan section)  Acute Rehab PT Goals Patient Stated Goal: pt unable to state, spouse wants pt to return to PLOF PT Goal Formulation: Patient unable to participate in goal setting    Frequency 7X/week   Barriers to discharge        Co-evaluation PT/OT/SLP Co-Evaluation/Treatment: Yes Reason for Co-Treatment: Complexity of the patient's impairments (multi-system involvement);Necessary to address cognition/behavior during functional activity;For patient/therapist safety;To address functional/ADL transfers PT goals addressed during session: Mobility/safety with mobility;Balance OT goals addressed during session: ADL's and self-care       AM-PAC PT "6 Clicks" Daily Activity  Outcome Measure Difficulty turning over in bed (including adjusting bedclothes, sheets and blankets)?: Unable Difficulty moving from lying on back to sitting on the side of the  bed? :  Unable Difficulty sitting down on and standing up from a chair with arms (e.g., wheelchair, bedside commode, etc,.)?: Unable Help needed moving to and from a bed to chair (including a wheelchair)?: Total Help needed walking in hospital room?: Total Help needed climbing 3-5 steps with a railing? : Total 6 Click Score: 6    End of Session   Activity Tolerance: Treatment limited secondary to medical complications (Comment);Patient limited by lethargy;Treatment limited secondary to agitation;Patient limited by pain Patient left: in bed;with bed alarm set;with family/visitor present   PT Visit Diagnosis: Unsteadiness on feet (R26.81);Muscle weakness (generalized) (M62.81);Hemiplegia and hemiparesis;Pain Hemiplegia - Right/Left: Right Hemiplegia - dominant/non-dominant: Dominant Hemiplegia - caused by: Cerebral infarction Pain - Right/Left: Right Pain - part of body: Shoulder    Time: 6659-9357 PT Time Calculation (min) (ACUTE ONLY): 31 min   Charges:   PT Evaluation $PT Eval High Complexity: 1 High PT Treatments $Neuromuscular Re-education: 8-22 mins        Hortencia Conradi, SPT 05/24/18,3:15 PM

## 2018-05-24 NOTE — Progress Notes (Signed)
[facility]  Daily Progress Note   Subjective  - 2 Days Post-Op  Lethargic currently.  Family says remains confused and intermittently agitated.  Neurology tried to see the patient but he refused.    Objective Vitals:   05/24/18 1100 05/24/18 1200 05/24/18 1300 05/24/18 1400  BP: (!) 141/71 126/64 (!) 134/98 (!) 143/63  Pulse: 60 66 60 60  Resp: 12 16 18 20   Temp:      TempSrc:      SpO2:  99% 96% 94%  Weight:      Height:        Intake/Output Summary (Last 24 hours) at 05/24/2018 1805 Last data filed at 05/24/2018 1349 Gross per 24 hour  Intake 2845 ml  Output 475 ml  Net 2370 ml    PULM  CTAB CV  RRR VASC  Access site C/D/I  Laboratory CBC    Component Value Date/Time   WBC 7.5 05/23/2018 0214   HGB 13.4 05/23/2018 0214   HGB 13.6 03/23/2014 2024   HCT 39.1 (L) 05/23/2018 0214   HCT 40.9 03/23/2014 2024   PLT 145 (L) 05/23/2018 0214   PLT 182 03/23/2014 2024    BMET    Component Value Date/Time   NA 140 05/23/2018 0214   NA 140 03/23/2014 2024   K 4.2 05/23/2018 0214   K 3.8 03/23/2014 2024   CL 106 05/23/2018 0214   CL 104 03/23/2014 2024   CO2 28 05/23/2018 0214   CO2 27 03/23/2014 2024   GLUCOSE 147 (H) 05/23/2018 0214   GLUCOSE 156 04/16/2015 1220   BUN 11 05/23/2018 0214   BUN 13 03/23/2014 2024   CREATININE 0.82 05/23/2018 0214   CREATININE 0.97 03/23/2014 2024   CALCIUM 8.5 (L) 05/23/2018 0214   CALCIUM 8.7 03/23/2014 2024   GFRNONAA >60 05/23/2018 0214   GFRNONAA >60 03/23/2014 2024   GFRAA >60 05/23/2018 0214   GFRAA >60 03/23/2014 2024    Assessment/Planning: POD #2 s/p right carotid stent   Needs a neurology evaluation  Right shoulder pain. Xray negative.  Ortho consult has been placed.  Unclear this etiology, but family says it predates the procedure  Not sure why his mental status remains poor.  Would try to limit agents that may worsen MS.  Appreciate other service input    Leotis Pain  05/24/2018, 6:05 PM

## 2018-05-25 DIAGNOSIS — G9340 Encephalopathy, unspecified: Secondary | ICD-10-CM

## 2018-05-25 DIAGNOSIS — G9341 Metabolic encephalopathy: Secondary | ICD-10-CM

## 2018-05-25 LAB — GLUCOSE, CAPILLARY: GLUCOSE-CAPILLARY: 107 mg/dL — AB (ref 70–99)

## 2018-05-25 NOTE — Progress Notes (Signed)
Vascular Surgery   At baseline: remains confused/agitated. Eating. No apparent distress.  Vitals:   05/25/18 0300 05/25/18 0400  BP: (!) 134/59 (!) 143/67  Pulse: 64 66  Resp: 18 18  Temp:    SpO2: 97% 98%   CV: RR Neuro: confused, Left arm/leg- 5/5 strength: Right arm- 3/5-unchanged  POD #3 s/p stenting of tandem stenoses right cervical carotid artery system  Appreciate Neuro input- MRI at some point in the future/anticoagulation Follow neuro exam PT-OOB as tolerated

## 2018-05-25 NOTE — Progress Notes (Addendum)
Physical Therapy Treatment Patient Details Name: Kyle Richardson MRN: [id number] DOB: 01-20-1928 Today's Date: 05/25/2018    History of Present Illness Pt is an 84 yo male s/p R Carotid stent placement. The carotid stenosis was identified in July when he was brought to the emergency room with right arm weakness and right eye visual changes.  At that time it was identified that he had had a 36mm infarct to R temporal lobe, subacute/chronic infarcts to L PCA, L BG and L thalamus. pt was admitted to CIR at the time and made great improvement nearing baseline functioning. Since procedure pt has been very confused and agitiated. Pt neuro deficits are returning to levels pre CIR.     PT Comments    Pt able to participate with limited supine exercises today with therapy. Heavy cues for sequencing with bed mobility. Initially he requires minA+1 to remain sitting as he falls backwards. Eventually he is able to balance with CGA only. Pt requires heavy cues for sequencing with therapist to transfer. He is able to remain standing for approximately 30s but he continually attempts to sit back down. Pt is unsafe to attempt ambulation at this time. Pt is limited in his ability to participate today. Pt will benefit from PT services to address deficits in strength, balance, and mobility in order to return to full function at home.    Follow Up Recommendations  SNF     Equipment Recommendations  Rolling walker with 5" wheels    Recommendations for Other Services       Precautions / Restrictions Precautions Precautions: Fall Restrictions Weight Bearing Restrictions: No Other Position/Activity Restrictions: R sided weakness. R shoulder pain with movement above 90 degrees.    Mobility  Bed Mobility Overal bed mobility: Needs Assistance Bed Mobility: Supine to Sit     Supine to sit: Mod assist     General bed mobility comments: Heavy cues for sequencing with bed mobility. Initially he requires minA+1 to  remain sitting as he falls backwards. Eventually he is able to balance with CGA only  Transfers Overall transfer level: Needs assistance Equipment used: None Transfers: Sit to/from Stand Sit to Stand: Mod assist         General transfer comment: Pt requires heavy cues for sequencing with therapist. He is able to remain standing for approximately 30s but he continually attempts to sit back down. Pt is unsafe to attempt ambulation at this time.   Ambulation/Gait                 Stairs             Wheelchair Mobility    Modified Rankin (Stroke Patients Only)       Balance Overall balance assessment: Needs assistance Sitting-balance support: Feet supported;Bilateral upper extremity supported Sitting balance-Leahy Scale: Poor Sitting balance - Comments: Initially poor but eventually improves to CGA only   Standing balance support: No upper extremity supported Standing balance-Leahy Scale: Poor Standing balance comment: Requires continual support in standing                            Cognition Arousal/Alertness: Lethargic Behavior During Therapy: Agitated;Restless Overall Cognitive Status: Impaired/Different from baseline Area of Impairment: Orientation;Attention;Awareness;Problem solving;Following commands                 Orientation Level: Person Current Attention Level: Alternating   Following Commands: Follows one step commands inconsistently     Problem Solving: Slow  processing;Requires tactile cues;Requires verbal cues;Difficulty sequencing;Decreased initiation        Exercises General Exercises - Lower Extremity Ankle Circles/Pumps: Both;15 reps Heel Slides: Both;15 reps Hip ABduction/ADduction: Both;15 reps Straight Leg Raises: Both;15 reps    General Comments        Pertinent Vitals/Pain Pain Assessment: Faces Faces Pain Scale: Hurts even more Pain Location: R shoulder and R hip Pain Descriptors / Indicators:  Moaning;Grimacing;Guarding Pain Intervention(s): Monitored during session    Home Living                      Prior Function            PT Goals (current goals can now be found in the care plan section) Acute Rehab PT Goals Patient Stated Goal: pt unable to state, spouse wants pt to return to PLOF PT Goal Formulation: Patient unable to participate in goal setting Progress towards PT goals: Progressing toward goals    Frequency    7X/week      PT Plan Discharge plan needs to be updated    Co-evaluation              AM-PAC PT "6 Clicks" Daily Activity  Outcome Measure  Difficulty turning over in bed (including adjusting bedclothes, sheets and blankets)?: Unable Difficulty moving from lying on back to sitting on the side of the bed? : Unable Difficulty sitting down on and standing up from a chair with arms (e.g., wheelchair, bedside commode, etc,.)?: Unable Help needed moving to and from a bed to chair (including a wheelchair)?: Total Help needed walking in hospital room?: Total Help needed climbing 3-5 steps with a railing? : Total 6 Click Score: 6    End of Session Equipment Utilized During Treatment: Gait belt Activity Tolerance: Treatment limited secondary to agitation Patient left: in bed;with bed alarm set;with nursing/sitter in room   PT Visit Diagnosis: Unsteadiness on feet (R26.81);Muscle weakness (generalized) (M62.81);Hemiplegia and hemiparesis;Pain Hemiplegia - Right/Left: Right Hemiplegia - dominant/non-dominant: Dominant Hemiplegia - caused by: Cerebral infarction Pain - Right/Left: Right Pain - part of body: Shoulder     Time: 9449-6759 PT Time Calculation (min) (ACUTE ONLY): 14 min  Charges:  $Therapeutic Exercise: 8-22 mins                     [name] PT, DPT, GCS    [name] 05/25/2018, 3:54 PM

## 2018-05-25 NOTE — Consult Note (Signed)
ORTHOPAEDIC CONSULTATION  REQUESTING PHYSICIAN: Schnier, Dolores Lory, MD  Chief Complaint: Right shoulder pain  HPI: Kyle Richardson is a 84 y.o. male who status post carotid stenting on 05/22/2018.  Orthopedics is consulted for persistent right shoulder pain.  Patient's daughter is at the bedside.  She explains that the pain began at home prior to bring the patient to the hospital the morning of surgery.  There is no witnessed fall or injury.  She states that her father generally does not complain about pain had considerable pain that morning.  Patient is currently confused and is unable to provide an accurate history or follow commands accurately during his physical exam.  The daughter states the patient has noted burning pain in his right upper extremity.  She states that he has been holding his right forearm at times as well.  Past Medical History:  Diagnosis Date  . Abdominal fibromatosis   . 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  s/p PTCA of the mid RCA 1/96, also  s/p  CABG x 4 7/96  . Complication of anesthesia    confusion and hallucinations for several days after receiving Versed  . Degenerative disc disease   . Diabetes mellitus ([place])   . Glaucoma   . Hypercholesterolemia   . Hypertension   . Migraine headache    history of  . Myocardial infarction ([place]) 1996  . Osteoarthritis   . Sleep apnea   . Stroke [facility]) 02/22/2017   affected left side    Past Surgical History:  Procedure Laterality Date  . APPENDECTOMY    . CAROTID ANGIOGRAPHY Bilateral 04/19/2018   Procedure: CAROTID ANGIOGRAPHY;  Surgeon: Algernon Huxley, MD;  Location: [facility];  Service: Cardiovascular;  Laterality: Bilateral;  . CAROTID PTA/STENT INTERVENTION Right 05/22/2018   Procedure: CAROTID PTA/STENT INTERVENTION;  Surgeon: Katha Cabal, MD;  Location: [facility];  Service: Cardiovascular;  Laterality: Right;  . CATARACT EXTRACTION W/ INTRAOCULAR  LENS  IMPLANT, BILATERAL    . CHOLECYSTECTOMY     Removed  . CORONARY ARTERY BYPASS GRAFT  7/96   x4  . HEMORRHOID SURGERY    . HERNIA REPAIR    . TONSILLECTOMY    . UPP and tonsillectomy  1/86   Social History   Socioeconomic History  . Marital status: Married    Spouse name: [name]  . Number of children: 2  . Years of education: come college  . Highest education level: Associate degree: occupational, Hotel manager, or vocational program  Occupational History  . Occupation: retired    Fish farm manager: [employer]  Social Needs  . Financial resource strain: Not hard at all  . Food insecurity:    Worry: Never true    Inability: Never true  . Transportation needs:    Medical: No    Non-medical: No  Tobacco Use  . Smoking status: Never Smoker  . Smokeless tobacco: Never Used  Substance and Sexual Activity  . Alcohol use: No    Alcohol/week: 0.0 standard drinks  . Drug use: No  . Sexual activity: Not Currently  Lifestyle  . Physical activity:    Days per week: 0 days    Minutes per session: 0 min  . Stress: Not at all  Relationships  . Social connections:    Talks on phone: Never    Gets together: Three times a week    Attends religious service: More than 4 times per year  Active member of club or organization: No    Attends meetings of clubs or organizations: Never    Relationship status: Married  Other Topics Concern  . Not on file  Social History Narrative  . Not on file   Family History  Problem Relation Age of Onset  . Heart disease Mother        died age 41  . Heart disease Father        and other vascular disease  . Diabetes Unknown        four siblings  . Heart disease Brother        s/p CABG  . Colon cancer Neg Hx   . Prostate cancer Neg Hx    Allergies  Allergen Reactions  . Clarithromycin Diarrhea  . Codeine Other (See Comments)    Unknown  . Flomax [Tamsulosin Hcl] Other (See Comments)    Unknown  . Lamisil [Terbinafine] Other (See Comments)    Unknown   . Levofloxacin Other (See Comments)    Diplopia. NEVER put patient on levoquin!  . Sertraline Other (See Comments)    Unknown  . Sulfa Antibiotics Other (See Comments)    Unknown  . Versed [Midazolam] Other (See Comments)    Hallucinations and confusion   Prior to Admission medications   Medication Sig Start Date End Date Taking? Authorizing Provider  acetaminophen (TYLENOL) 500 MG tablet Take 1 tablet (500 mg total) by mouth every 6 (six) hours as needed. Patient taking differently: Take 500 mg by mouth every 6 (six) hours as needed for moderate pain or headache.  04/01/18  Yes Angiulli, Lavon Paganini, PA-C  apixaban (ELIQUIS) 5 MG TABS tablet Take 1 tablet (5 mg total) by mouth 2 (two) times daily. 04/01/18  Yes Angiulli, Lavon Paganini, PA-C  atorvastatin (LIPITOR) 40 MG tablet Take 1 tablet (40 mg total) by mouth daily. Patient taking differently: Take 40 mg by mouth at bedtime.  04/01/18  Yes Angiulli, Lavon Paganini, PA-C  divalproex (DEPAKOTE ER) 500 MG 24 hr tablet Take 1 tablet (500 mg total) by mouth daily. Patient taking differently: Take 500 mg by mouth at bedtime.  04/01/18  Yes Angiulli, Lavon Paganini, PA-C  FLUoxetine (PROZAC) 10 MG capsule Take 1 capsule (10 mg total) by mouth daily. 04/01/18  Yes Angiulli, Lavon Paganini, PA-C  fluticasone (FLONASE) 50 MCG/ACT nasal spray SPRAY 2 SPRAYS IN EACH NOSTRIL ONCE A DAY Patient taking differently: SPRAY 1 SPRAY IN EACH NOSTRIL ONCE A DAY AT BEDTIME 02/20/18  Yes Einar Pheasant, MD  latanoprost (XALATAN) 0.005 % ophthalmic solution Place 1 drop into both eyes at bedtime.   Yes [provider]  loratadine (CLARITIN) 10 MG tablet Take 10 mg by mouth daily.   Yes [provider]  metoprolol succinate (TOPROL-XL) 25 MG 24 hr tablet Take 0.5 tablets (12.5 mg total) by mouth daily. 04/22/18  Yes Pyreddy, Reatha Harps, MD  Multiple Vitamins-Minerals (PRESERVISION AREDS 2 PO) Take 1 capsule by mouth 2 (two) times daily.   Yes [provider]   nitroGLYCERIN (NITROSTAT) 0.4 MG SL tablet Place 0.4 mg under the tongue every 5 (five) minutes as needed for chest pain.    Yes [provider]   Dg Shoulder Right Port  Result Date: 05/24/2018 CLINICAL DATA:  Right shoulder and hip pain EXAM: PORTABLE RIGHT SHOULDER COMPARISON:  None. FINDINGS: There is no evidence of fracture or dislocation. There is a small calcification adjacent to the greater tuberosity which may reflect calcific tendinosis or calcific bursitis.  There is no evidence of arthropathy or other focal bone abnormality. Soft tissues are unremarkable. IMPRESSION: No acute osseous injury of the right shoulder. Electronically Signed   By: Kathreen Devoid   On: 05/24/2018 14:20   Dg Hip Port Unilat With Pelvis 1v Right  Result Date: 05/24/2018 CLINICAL DATA:  84 year old male with acute right hip pain today. No known injury. EXAM: DG HIP (WITH OR WITHOUT PELVIS) 1V PORT RIGHT COMPARISON:  CT Abdomen and Pelvis 02/15/2014. FINDINGS: Bone mineralization is within normal limits for age. Femoral heads are normally located. Hip joint spaces appear stable since 2015, symmetric, and within normal limits. Pelvis appears intact. Chronic pelvic phleboliths. Negative visible bowel gas pattern. Calcified bilateral femoral artery atherosclerosis. Grossly intact proximal left femur. The proximal right femur appears intact with dystrophic and/or vascular calcifications about the proximal right femur. IMPRESSION: No acute osseous abnormality identified about the right hip or pelvis. Electronically Signed   By: Genevie Ann M.D.   On: 05/24/2018 14:20    Positive ROS: All other systems have been reviewed and were otherwise negative with the exception of those mentioned in the HPI and as above.  Physical Exam: General: Awake sitting up in a chair, no acute distress  MUSCULOSKELETAL: Right upper extremity: Patient skin is intact.  There is no erythema ecchymosis or swelling.  His arm and forearm  compartments are soft and compressible.  There is no muscle atrophy seen.  Patient's right upper extremity is well-perfused.  He spontaneously flexes and extends his fingers with slight degree and was able to gently squeeze my hand.  He is not able to actively forward elevate or abduct his shoulder.  Passive abduction to 90 degrees gave him mild discomfort.  Passive rotation in abduction caused him minimal pain.  His strength could not be assessed other than he was unable to raise his arm.  It was impossible to assess his sensation.  Assessment: Right shoulder pain secondary to rotator cuff tendinitis/bursitis versus rotator cuff tear versus cervical radiculopathy  Plan: It is difficult based on the limited history and physical exam to determine for sure the etiology behind the patient's pain.  The pain predated surgery.  No history of injury.  It is possible the patient slept awkwardly on his neck or right shoulder and may have cervical radiculopathy or shoulder impingement causing underlying rotator cuff tendinitis and bursitis.  I would recommend a course of an oral steroid taper if approved by the medical team.  This would help to treat inflammation around the cervical nerve roots and bursitis and tendinitis.  Patient is not a candidate for NSAIDs based on fact that he is on anticoagulation.  An MRI of the cervical spine and right shoulder may be of value in helping to make a diagnosis.  However the patient may not be a candidate for an MRI having just undergone stent placement.  I will discuss this with vascular surgery.  Continue physical therapy as ordered.  Patient may use his right shoulder as his pain allows.  I reviewed the x-ray films of the right shoulder and hip.  These films do not show any evidence of fracture or dislocation.  There are degenerative changes in the right hip and mild to moderate degenerative changes in the right shoulder as well.  A corticosteroid injection into the right  shoulder put could be considered if the patient cannot tolerate an oral steroid taper, but a steroid injection into the shoulder would not help potential cervical radiculopathy.  Thornton Park, MD    05/25/2018 7:39 PM

## 2018-05-25 NOTE — Progress Notes (Signed)
Reason for Consult:AMS Referring Physician: Conforti  CC: AMS  Interval history 05/25/2018: His daughter is at bedside. She feels no improvement today. Patient has had similar experience after procedure and anesthesia in the past with AMS and agitation.   HPI: Kyle Richardson is an 84 y.o. male who is unable to provide any history due to his mental status.  Patient underwent right carotid stent placement 2 days ago.  Was seen by neurology on 7/9 during that hospitalization for stroke.  Has h/o left PCA infarct in May of this year.  Presented in July with confusion.  At that time with an acute right temporal infarct on Eliquis.  Was unable to follow commands and non-fluent.    Past Medical History:  Diagnosis Date  . Abdominal fibromatosis   . 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  s/p PTCA of the mid RCA 1/96, also  s/p  CABG x 4 7/96  . Complication of anesthesia    confusion and hallucinations for several days after receiving Versed  . Degenerative disc disease   . Diabetes mellitus ([place])   . Glaucoma   . Hypercholesterolemia   . Hypertension   . Migraine headache    history of  . Myocardial infarction ([place]) 1996  . Osteoarthritis   . Sleep apnea   . Stroke [facility]) 02/22/2017   affected left side     Past Surgical History:  Procedure Laterality Date  . APPENDECTOMY    . CAROTID ANGIOGRAPHY Bilateral 04/19/2018   Procedure: CAROTID ANGIOGRAPHY;  Surgeon: Algernon Huxley, MD;  Location: [place] [facility];  Service: Cardiovascular;  Laterality: Bilateral;  . CAROTID PTA/STENT INTERVENTION Right 05/22/2018   Procedure: CAROTID PTA/STENT INTERVENTION;  Surgeon: Katha Cabal, MD;  Location: [place] [facility];  Service: Cardiovascular;  Laterality: Right;  . CATARACT EXTRACTION W/ INTRAOCULAR LENS  IMPLANT, BILATERAL    . CHOLECYSTECTOMY     Removed  . CORONARY ARTERY BYPASS GRAFT  7/96   x4  . HEMORRHOID SURGERY    . HERNIA REPAIR    .  TONSILLECTOMY    . UPP and tonsillectomy  1/86    Family History  Problem Relation Age of Onset  . Heart disease Mother        died age 65  . Heart disease Father        and other vascular disease  . Diabetes Unknown        four siblings  . Heart disease Brother        s/p CABG  . Colon cancer Neg Hx   . Prostate cancer Neg Hx     Social History:  reports that he has never smoked. He has never used smokeless tobacco. He reports that he does not drink alcohol or use drugs.  Allergies  Allergen Reactions  . Clarithromycin Diarrhea  . Codeine Other (See Comments)    Unknown  . Flomax [Tamsulosin Hcl] Other (See Comments)    Unknown  . Lamisil [Terbinafine] Other (See Comments)    Unknown  . Levofloxacin Other (See Comments)    Diplopia. NEVER put patient on levoquin!  . Sertraline Other (See Comments)    Unknown  . Sulfa Antibiotics Other (See Comments)    Unknown  . Versed [Midazolam] Other (See Comments)    Hallucinations and confusion    Medications:  I have reviewed the patient's current medications. Prior to Admission:  Medications Prior to Admission  Medication Sig Dispense  Refill Last Dose  . acetaminophen (TYLENOL) 500 MG tablet Take 1 tablet (500 mg total) by mouth every 6 (six) hours as needed. (Patient taking differently: Take 500 mg by mouth every 6 (six) hours as needed for moderate pain or headache. ) 30 tablet 0 Past Month at Unknown time  . apixaban (ELIQUIS) 5 MG TABS tablet Take 1 tablet (5 mg total) by mouth 2 (two) times daily. 30 tablet 1 05/21/2018 at Unknown time  . atorvastatin (LIPITOR) 40 MG tablet Take 1 tablet (40 mg total) by mouth daily. (Patient taking differently: Take 40 mg by mouth at bedtime. ) 30 tablet 1 05/21/2018 at Unknown time  . divalproex (DEPAKOTE ER) 500 MG 24 hr tablet Take 1 tablet (500 mg total) by mouth daily. (Patient taking differently: Take 500 mg by mouth at bedtime. ) 30 tablet 1 05/21/2018 at Unknown time  . FLUoxetine  (PROZAC) 10 MG capsule Take 1 capsule (10 mg total) by mouth daily. 30 capsule 3 05/22/2018 at Unknown time  . fluticasone (FLONASE) 50 MCG/ACT nasal spray SPRAY 2 SPRAYS IN EACH NOSTRIL ONCE A DAY (Patient taking differently: SPRAY 1 SPRAY IN EACH NOSTRIL ONCE A DAY AT BEDTIME) 16 g 2 05/21/2018 at Unknown time  . latanoprost (XALATAN) 0.005 % ophthalmic solution Place 1 drop into both eyes at bedtime.   05/22/2018 at Unknown time  . loratadine (CLARITIN) 10 MG tablet Take 10 mg by mouth daily.   05/22/2018 at Unknown time  . metoprolol succinate (TOPROL-XL) 25 MG 24 hr tablet Take 0.5 tablets (12.5 mg total) by mouth daily. 30 tablet 1 05/22/2018 at Unknown time  . Multiple Vitamins-Minerals (PRESERVISION AREDS 2 PO) Take 1 capsule by mouth 2 (two) times daily.   05/22/2018 at Unknown time  . nitroGLYCERIN (NITROSTAT) 0.4 MG SL tablet Place 0.4 mg under the tongue every 5 (five) minutes as needed for chest pain.    Past Month at Unknown time   Scheduled: . apixaban  5 mg Oral BID  . aspirin  162 mg Oral Daily  . atorvastatin  40 mg Oral q1800  . clopidogrel  75 mg Oral Daily  . docusate sodium  100 mg Oral Daily  . famotidine  20 mg Oral BID  . FLUoxetine  10 mg Oral Daily  . fluticasone  2 spray Each Nare Daily  . latanoprost  1 drop Both Eyes QHS  . loratadine  10 mg Oral Daily  . metoprolol succinate  12.5 mg Oral Daily    ROS: Unable to provide due to mental status  Physical Examination: Blood pressure (!) 143/67, pulse 66, temperature 98.2 F (36.8 C), temperature source Oral, resp. rate 18, height 5\' 9"  (1.753 m), weight 77.4 kg, SpO2 98 %.  Unable to examine extensively due to aggressive and combative behavior.  Patient extensively agitated due to my presence.    Neurological Examination  Mental Status: Alert. Confused.  Combative. Agitated. He does tell me his name and reaches out to his daughter. Speech is unintelligible.  Cranial Nerves: PERRL, EOM appear intact he can gaze  horizontally to either side and appears to be able to see me on his right and his daughter on his left, responds to voice so hearing appears to be intact to voice, No facial asymmetry noted. Motor: Moves both upper extremities against gravity. His right arm not moving as much as left arm, he has some abnormal movements in all limbs possibly myoclonic jerks Sensation: responds to stim x 4   Laboratory  Studies:   Basic Metabolic Panel: Recent Labs  Lab 05/21/18 1047 05/23/18 0214  NA  --  140  K  --  4.2  CL  --  106  CO2  --  28  GLUCOSE  --  147*  BUN 13 11  CREATININE 0.80 0.82  CALCIUM  --  8.5*    Liver Function Tests: No results for input(s): AST, ALT, ALKPHOS, BILITOT, PROT, ALBUMIN in the last 168 hours. No results for input(s): LIPASE, AMYLASE in the last 168 hours. No results for input(s): AMMONIA in the last 168 hours.  CBC: Recent Labs  Lab 05/23/18 0214  WBC 7.5  HGB 13.4  HCT 39.1*  MCV 95.9  PLT 145*    Cardiac Enzymes: Recent Labs  Lab 05/22/18 0747  TROPONINI <0.03    BNP: Invalid input(s): POCBNP  CBG: Recent Labs  Lab 05/22/18 1957 05/22/18 2357 05/23/18 0402 05/23/18 0758 05/25/18 1136  GLUCAP 134* 119* 133* 101* 107*    Microbiology: Results for orders placed or performed during the hospital encounter of 05/22/18  MRSA PCR Screening     Status: None   Collection Time: 05/22/18 12:30 PM  Result Value Ref Range Status   MRSA by PCR NEGATIVE NEGATIVE Final    Comment:        The GeneXpert MRSA Assay (FDA approved for NASAL specimens only), is one component of a comprehensive MRSA colonization surveillance program. It is not intended to diagnose MRSA infection nor to guide or monitor treatment for MRSA infections. Performed at [facility], [address] [place], [place] [address]     Coagulation Studies: No results for input(s): LABPROT, INR in the last 72 hours.  Urinalysis: No results for input(s):  COLORURINE, LABSPEC, PHURINE, GLUCOSEU, HGBUR, BILIRUBINUR, KETONESUR, PROTEINUR, UROBILINOGEN, NITRITE, LEUKOCYTESUR in the last 168 hours.  Invalid input(s): APPERANCEUR  Lipid Panel:     Component Value Date/Time   CHOL 159 04/16/2018 0558   CHOL 129 08/13/2013 1026   TRIG 288 (H) 04/16/2018 0558   TRIG 166 08/13/2013 1026   HDL 35 (L) 04/16/2018 0558   HDL 47 08/13/2013 1026   CHOLHDL 4.5 04/16/2018 0558   VLDL 58 (H) 04/16/2018 0558   VLDL 33 08/13/2013 1026   LDLCALC 66 04/16/2018 0558   LDLCALC 49 08/13/2013 1026    HgbA1C:  Lab Results  Component Value Date   HGBA1C 6.8 (H) 04/16/2018    Urine Drug Screen:  No results found for: LABOPIA, COCAINSCRNUR, LABBENZ, AMPHETMU, THCU, LABBARB  Alcohol Level: No results for input(s): ETH in the last 168 hours.  Other results: EKG: sinus rhythm at 69 bpm with PVC's.  Imaging: Dg Shoulder Right Port  Result Date: 05/24/2018 CLINICAL DATA:  Right shoulder and hip pain EXAM: PORTABLE RIGHT SHOULDER COMPARISON:  None. FINDINGS: There is no evidence of fracture or dislocation. There is a small calcification adjacent to the greater tuberosity which may reflect calcific tendinosis or calcific bursitis. There is no evidence of arthropathy or other focal bone abnormality. Soft tissues are unremarkable. IMPRESSION: No acute osseous injury of the right shoulder. Electronically Signed   By: Kathreen Devoid   On: 05/24/2018 14:20   Dg Hip Port Unilat With Pelvis 1v Right  Result Date: 05/24/2018 CLINICAL DATA:  84 year old male with acute right hip pain today. No known injury. EXAM: DG HIP (WITH OR WITHOUT PELVIS) 1V PORT RIGHT COMPARISON:  CT Abdomen and Pelvis 02/15/2014. FINDINGS: Bone mineralization is within normal limits for age. Femoral heads  are normally located. Hip joint spaces appear stable since 2015, symmetric, and within normal limits. Pelvis appears intact. Chronic pelvic phleboliths. Negative visible bowel gas pattern. Calcified  bilateral femoral artery atherosclerosis. Grossly intact proximal left femur. The proximal right femur appears intact with dystrophic and/or vascular calcifications about the proximal right femur. IMPRESSION: No acute osseous abnormality identified about the right hip or pelvis. Electronically Signed   By: Genevie Ann M.D.   On: 05/24/2018 14:20     Assessment/Plan: 84 year old male with recent infarcts now s/p right carotid stent placement.  Patient with history of worsening cognition from last hospitalization.  Current presentation may represent continued progression of cognitive issues that have been escalated by hospitalization and recent procedure.  Can not rule out recurrent infarct as well since patient has a history of strokes on anticoagulation.   Head CT shows no acute changes.  Lab work unremarkable.  Recommendations: 1. Patient will require MRI of the brain but due to cognitive status unable to tolerate at this time and likely will need to wait some time due to recent stent placement as well.   2. Patient to remain on anticoagulation.   3. Will continue to follow with you 4. Recommend infectious workup such as urinalysis and cxr to ensure no other causes for his AMS. Will defer this decision to ICU attending. 5. Will continue to follow  Sarina Ill, MD Neurology  05/25/2018, 1:32 PM

## 2018-05-25 NOTE — Clinical Social Work Note (Signed)
CSW is aware via chart review that PT is recommending SNF. CSW will assess when able.  Santiago Bumpers, MSW, Latanya Presser [phone]

## 2018-05-25 NOTE — Evaluation (Signed)
Clinical/Bedside Swallow Evaluation Patient Details  Name: Kyle Richardson MRN: [id number] Date of Birth: [birth year]  Today's Date: 05/25/2018 Time: SLP Start Time (ACUTE ONLY): 1333 SLP Stop Time (ACUTE ONLY): 1433 SLP Time Calculation (min) (ACUTE ONLY): 60 min  Past Medical History:  Past Medical History:  Diagnosis Date  . Abdominal fibromatosis   . 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  s/p PTCA of the mid RCA 1/96, also  s/p  CABG x 4 7/96  . Complication of anesthesia    confusion and hallucinations for several days after receiving Versed  . Degenerative disc disease   . Diabetes mellitus ([place])   . Glaucoma   . Hypercholesterolemia   . Hypertension   . Migraine headache    history of  . Myocardial infarction ([place]) 1996  . Osteoarthritis   . Sleep apnea   . Stroke [facility]) 02/22/2017   affected left side    Past Surgical History:  Past Surgical History:  Procedure Laterality Date  . APPENDECTOMY    . CAROTID ANGIOGRAPHY Bilateral 04/19/2018   Procedure: CAROTID ANGIOGRAPHY;  Surgeon: Algernon Huxley, MD;  Location: [place] [facility];  Service: Cardiovascular;  Laterality: Bilateral;  . CAROTID PTA/STENT INTERVENTION Right 05/22/2018   Procedure: CAROTID PTA/STENT INTERVENTION;  Surgeon: Katha Cabal, MD;  Location: [place] [facility];  Service: Cardiovascular;  Laterality: Right;  . CATARACT EXTRACTION W/ INTRAOCULAR LENS  IMPLANT, BILATERAL    . CHOLECYSTECTOMY     Removed  . CORONARY ARTERY BYPASS GRAFT  7/96   x4  . HEMORRHOID SURGERY    . HERNIA REPAIR    . TONSILLECTOMY    . UPP and tonsillectomy  1/86   HPI:    Pt is an 84 yo male s/p R Carotid stent placement. Pt with Right arm weakness and visual changes since CVA July of this year. Pt has been confused and aggitated, and has had hallucinations since procedure.   Assessment / Plan / Recommendation Clinical Impression  Pt presents with mostly functional swallowing  abilities at bedside with no overt s/s of aspiration. Today, Pt is confused and hallucinating some, but very pleasant and mostly cooperative. Pt often needed redirection and was easily distracted by any other movement in the room and even outside of the room. Pt tolerated thin liquids, soft solids and applesauce well. Oral transit delay with solids. Pt's cognitive status may make feeding difficult at times. Today, Pt took the Po's better when he was able to assist with self feeding. Hand over hand assist with the cup and the spoon was used. Vocal quality remained clear, laryngeal elevation appeared adequate. Rec Dysphagia 3 diet with thin liquids. Minimize distractions during meals. Nsg may need to crush meds and place in applesauce as Pt will likely chew the pills. ST to f/u with toleration of diet 1-3 days. SLP Visit Diagnosis: Dysphagia, oropharyngeal phase (R13.12)    Aspiration Risk  Mild aspiration risk    Diet Recommendation Dysphagia 3 (Mech soft)   Medication Administration: Crushed with puree Supervision: Staff to assist with self feeding Compensations: Slow rate;Minimize environmental distractions;Small sips/bites Postural Changes: Seated upright at 90 degrees;Remain upright for at least 30 minutes after po intake    Other  Recommendations Oral Care Recommendations: Oral care BID   Follow up Recommendations    F/u with toleration of diet    Frequency and Duration min 2x/week  1 week       Prognosis Prognosis  for Safe Diet Advancement: Good Barriers to Reach Goals: Cognitive deficits      Swallow Study   General Type of Study: Bedside Swallow Evaluation Diet Prior to this Study: Regular Behavior/Cognition: Confused;Impulsive;Distractible;Requires cueing Oral Cavity Assessment: Within Functional Limits Vision: Impaired for self-feeding Self-Feeding Abilities: Total assist Patient Positioning: Upright in bed Baseline Vocal Quality: Normal;Low vocal intensity     Oral/Motor/Sensory Function Overall Oral Motor/Sensory Function: Within functional limits   Ice Chips Ice chips: Within functional limits Presentation: Spoon   Thin Liquid Thin Liquid: Within functional limits Presentation: Cup;Spoon;Self Fed    Nectar Thick     Honey Thick     Puree Puree: Within functional limits Presentation: Spoon   Solid     Solid: Impaired Presentation: Self Fed Oral Phase Impairments: Reduced lingual movement/coordination Oral Phase Functional Implications: Prolonged oral transit      Kyle Richardson 05/25/2018,2:33 PM

## 2018-05-25 NOTE — Progress Notes (Signed)
Follow up - Critical Care Medicine Note  Patient Details:    Kyle Richardson is an 84 y.o. male.gentleman with a past medical history remarkable for coronary artery disease, hypertension, hypercholesterolemia, CVA, objective sleep apnea, glaucoma, diabetes mellitus, status post carotid stent   Lines, Airways, Drains:    Anti-infectives:  Anti-infectives (From admission, onward)   Start     Dose/Rate Route Frequency Ordered Stop   05/22/18 1600  ceFAZolin (ANCEF) IVPB 2g/100 mL premix     2 g 200 mL/hr over 30 Minutes Intravenous Every 8 hours 05/22/18 1320 05/23/18 0028   05/21/18 2145  ceFAZolin (ANCEF) IVPB 2g/100 mL premix     2 g 200 mL/hr over 30 Minutes Intravenous  Once 05/21/18 2131 05/22/18 0857      Microbiology: Results for orders placed or performed during the hospital encounter of 05/22/18  MRSA PCR Screening     Status: None   Collection Time: 05/22/18 12:30 PM  Result Value Ref Range Status   MRSA by PCR NEGATIVE NEGATIVE Final    Comment:        The GeneXpert MRSA Assay (FDA approved for NASAL specimens only), is one component of a comprehensive MRSA colonization surveillance program. It is not intended to diagnose MRSA infection nor to guide or monitor treatment for MRSA infections. Performed at [facility], [place], [place] [address]    Events:   Studies: Ct Head Wo Contrast  Result Date: 05/23/2018 CLINICAL DATA:  Onset of increased confusion over baseline this morning. EXAM: CT HEAD WITHOUT CONTRAST TECHNIQUE: Contiguous axial images were obtained from the base of the skull through the vertex without intravenous contrast. COMPARISON:  Head CT scan 04/20/2018.  Brain MRI 04/15/2018. FINDINGS: Brain: No evidence of acute infarction, hemorrhage, hydrocephalus, extra-axial collection or mass lesion/mass effect. Atrophy, chronic microvascular ischemic change and left PCA territory infarct are all again seen. Vascular: Extensive  atherosclerosis is noted. Skull: Intact and normal appearance. Sinuses/Orbits: No acute finding. Status post bilateral lens extraction. Other: None. IMPRESSION: No acute abnormality. Atrophy, chronic microvascular ischemic change and remote left PCA infarct. Electronically Signed   By: Inge Rise M.D.   On: 05/23/2018 12:38   Dg Shoulder Right Port  Result Date: 05/24/2018 CLINICAL DATA:  Right shoulder and hip pain EXAM: PORTABLE RIGHT SHOULDER COMPARISON:  None. FINDINGS: There is no evidence of fracture or dislocation. There is a small calcification adjacent to the greater tuberosity which may reflect calcific tendinosis or calcific bursitis. There is no evidence of arthropathy or other focal bone abnormality. Soft tissues are unremarkable. IMPRESSION: No acute osseous injury of the right shoulder. Electronically Signed   By: Kathreen Devoid   On: 05/24/2018 14:20   Dg Hip Port Unilat With Pelvis 1v Right  Result Date: 05/24/2018 CLINICAL DATA:  84 year old male with acute right hip pain today. No known injury. EXAM: DG HIP (WITH OR WITHOUT PELVIS) 1V PORT RIGHT COMPARISON:  CT Abdomen and Pelvis 02/15/2014. FINDINGS: Bone mineralization is within normal limits for age. Femoral heads are normally located. Hip joint spaces appear stable since 2015, symmetric, and within normal limits. Pelvis appears intact. Chronic pelvic phleboliths. Negative visible bowel gas pattern. Calcified bilateral femoral artery atherosclerosis. Grossly intact proximal left femur. The proximal right femur appears intact with dystrophic and/or vascular calcifications about the proximal right femur. IMPRESSION: No acute osseous abnormality identified about the right hip or pelvis. Electronically Signed   By: Genevie Ann M.D.   On: 05/24/2018 14:20  Consults: Treatment Team:  Catarina Hartshorn, MD Alexis Goodell, MD [place], MD   Subjective:    Overnight Issues:patient awake but confused, wife related that he  has been having pain in his hip and shoulder. Hip x-ray and CT scan was negative, shoulder film also negative. At wife's request consult with orthopedic surgery has been placed and has been seen by neurology.  Objective:  Vital signs for last 24 hours: Temp:  [98.1 F (36.7 C)-98.2 F (36.8 C)] 98.2 F (36.8 C) (08/16 1900) Pulse Rate:  [53-74] 66 (08/17 0400) Resp:  [10-20] 18 (08/17 0400) BP: (106-193)/(55-98) 143/67 (08/17 0400) SpO2:  [88 %-99 %] 98 % (08/17 0400)  Intake/Output from previous day: 08/16 0701 - 08/17 0700 In: 2214.7 [P.O.:220; I.V.:1774.2; IV Piggyback:220.5] Out: 500 [Urine:500]  Intake/Output this shift: No intake/output data recorded.  Vent settings for last 24 hours:    Physical Exam:   Physical Exam   HEENT: Trachea midline, no accessory muscle utilization, no thyromegaly noted,  Cardiovascular:  Regular rate and rhythm. Normal S1 and S2. No murmurs, rubs, gallops Pulmonary: Clear to auscultation Abdominal: Soft, non-distended, non-tender abdomen. Normoactive bowel sounds in all 4 quadrants  Musculoskeletal:  No swelling, clubbing, or edema. .  Neurological: Responds to name.  Patient with confusion and intermittent agitation    Assessment/Plan:   84  Year old male who is s/p right common and internal carotid stent placement. patient does have a prior history of multiple CVAs.CT scan of the head performed revealed from remote left PCA infarct with some microvascular changes. Patient still somnolent and confused. Being followed by neurology, for shoulder pain orthopedic consult has been placed, patient is stable for floor transfer  Hermelinda Dellen, DOPatient ID: Kyle Richardson, male   DOB: 03/31/1928, 84 y.o.   MRN: [id number]

## 2018-05-26 LAB — CBC WITH DIFFERENTIAL/PLATELET
Basophils Absolute: 0 10*3/uL (ref 0–0.1)
Basophils Relative: 1 %
Eosinophils Absolute: 0.3 10*3/uL (ref 0–0.7)
Eosinophils Relative: 6 %
HEMATOCRIT: 38 % — AB (ref 40.0–52.0)
HEMOGLOBIN: 13 g/dL (ref 13.0–18.0)
LYMPHS ABS: 1.3 10*3/uL (ref 1.0–3.6)
LYMPHS PCT: 22 %
MCH: 32.9 pg (ref 26.0–34.0)
MCHC: 34.3 g/dL (ref 32.0–36.0)
MCV: 95.9 fL (ref 80.0–100.0)
MONOS PCT: 15 %
Monocytes Absolute: 0.9 10*3/uL (ref 0.2–1.0)
NEUTROS ABS: 3.3 10*3/uL (ref 1.4–6.5)
NEUTROS PCT: 56 %
Platelets: 147 10*3/uL — ABNORMAL LOW (ref 150–440)
RBC: 3.96 MIL/uL — AB (ref 4.40–5.90)
RDW: 13.4 % (ref 11.5–14.5)
WBC: 5.8 10*3/uL (ref 3.8–10.6)

## 2018-05-26 LAB — COMPREHENSIVE METABOLIC PANEL
ALK PHOS: 46 U/L (ref 38–126)
ALT: 19 U/L (ref 0–44)
ANION GAP: 7 (ref 5–15)
AST: 26 U/L (ref 15–41)
Albumin: 2.6 g/dL — ABNORMAL LOW (ref 3.5–5.0)
BILIRUBIN TOTAL: 0.9 mg/dL (ref 0.3–1.2)
BUN: 5 mg/dL — ABNORMAL LOW (ref 8–23)
CALCIUM: 8.3 mg/dL — AB (ref 8.9–10.3)
CO2: 26 mmol/L (ref 22–32)
CREATININE: 0.69 mg/dL (ref 0.61–1.24)
Chloride: 106 mmol/L (ref 98–111)
GFR calc non Af Amer: >60 mL/min (ref >60)
GLUCOSE: 151 mg/dL — AB (ref 70–99)
Potassium: 4.2 mmol/L (ref 3.5–5.1)
SODIUM: 139 mmol/L (ref 135–145)
TOTAL PROTEIN: 5.6 g/dL — AB (ref 6.5–8.1)

## 2018-05-26 LAB — URINALYSIS, COMPLETE (UACMP) WITH MICROSCOPIC
BACTERIA UA: NONE SEEN
Bilirubin Urine: NEGATIVE
Glucose, UA: NEGATIVE mg/dL
Hgb urine dipstick: NEGATIVE
Ketones, ur: NEGATIVE mg/dL
Nitrite: NEGATIVE
PH: 8 (ref 5.0–8.0)
Protein, ur: NEGATIVE mg/dL
SPECIFIC GRAVITY, URINE: 1.004 — AB (ref 1.005–1.030)
SQUAMOUS EPITHELIAL / LPF: NONE SEEN (ref 0–5)

## 2018-05-26 NOTE — Progress Notes (Signed)
4 Days Post-Op   Subjective/Chief Complaint: Per daughter. Difficult evening. Increased confusion/agitation. This morning much better, more alert, no agitation.   Objective: Vital signs in last 24 hours: Temp:  [98 F (36.7 C)-98.5 F (36.9 C)] 98.5 F (36.9 C) (08/18 0744) Pulse Rate:  [53-92] 81 (08/18 0744) Resp:  [17-28] 18 (08/18 0404) BP: (89-167)/(51-120) 167/101 (08/18 0744) SpO2:  [95 %-100 %] 95 % (08/18 0744) Last BM Date: 05/25/18  Intake/Output from previous day: 08/17 0701 - 08/18 0700 In: 1861.3 [P.O.:840; I.V.:1021.3] Out: 600 [Urine:600] Intake/Output this shift: No intake/output data recorded.  General appearance: no distress and confused- but improved from prior exam Cardio: regular rate and rhythm, S1, S2 normal, no murmur, click, rub or gallop Neurologic: Mental status: alertness: confused but awnsers some questions appropriately Motor: LEFT: 5/5. Right arm- 3/5 unchanged .  Lab Results:  No results for input(s): WBC, HGB, HCT, PLT in the last 72 hours. BMET No results for input(s): NA, K, CL, CO2, GLUCOSE, BUN, CREATININE, CALCIUM in the last 72 hours. PT/INR No results for input(s): LABPROT, INR in the last 72 hours. ABG No results for input(s): PHART, HCO3 in the last 72 hours.  Invalid input(s): PCO2, PO2  Studies/Results: Dg Shoulder Right Port  Result Date: 05/24/2018 CLINICAL DATA:  Right shoulder and hip pain EXAM: PORTABLE RIGHT SHOULDER COMPARISON:  None. FINDINGS: There is no evidence of fracture or dislocation. There is a small calcification adjacent to the greater tuberosity which may reflect calcific tendinosis or calcific bursitis. There is no evidence of arthropathy or other focal bone abnormality. Soft tissues are unremarkable. IMPRESSION: No acute osseous injury of the right shoulder. Electronically Signed   By: Kathreen Devoid   On: 05/24/2018 14:20   Dg Hip Port Unilat With Pelvis 1v Right  Result Date: 05/24/2018 CLINICAL DATA:   84 year old male with acute right hip pain today. No known injury. EXAM: DG HIP (WITH OR WITHOUT PELVIS) 1V PORT RIGHT COMPARISON:  CT Abdomen and Pelvis 02/15/2014. FINDINGS: Bone mineralization is within normal limits for age. Femoral heads are normally located. Hip joint spaces appear stable since 2015, symmetric, and within normal limits. Pelvis appears intact. Chronic pelvic phleboliths. Negative visible bowel gas pattern. Calcified bilateral femoral artery atherosclerosis. Grossly intact proximal left femur. The proximal right femur appears intact with dystrophic and/or vascular calcifications about the proximal right femur. IMPRESSION: No acute osseous abnormality identified about the right hip or pelvis. Electronically Signed   By: Genevie Ann M.D.   On: 05/24/2018 14:20    Anti-infectives: Anti-infectives (From admission, onward)   Start     Dose/Rate Route Frequency Ordered Stop   05/22/18 1600  ceFAZolin (ANCEF) IVPB 2g/100 mL premix     2 g 200 mL/hr over 30 Minutes Intravenous Every 8 hours 05/22/18 1320 05/23/18 0028   05/21/18 2145  ceFAZolin (ANCEF) IVPB 2g/100 mL premix     2 g 200 mL/hr over 30 Minutes Intravenous  Once 05/21/18 2131 05/22/18 0857      Assessment/Plan: s/p Procedure(s): CAROTID PTA/STENT INTERVENTION (Right)    POD #4s/p stenting of tandem stenoses right cervical carotid artery system  Appreciate Neuro input-  Follow neuro exam Appreciate Ortho input- will hold off on MRI and steroids at this juncture Continue Anticoagulation PT-OOB as tolerated  Will obtain UA/UCX, labs and Bld CX- rule out infective source of confusion.  LOS: 4 days    Evaristo Bury 05/26/2018

## 2018-05-26 NOTE — Progress Notes (Signed)
Vascular surgery has recommended holding on steroids and MRI for now.  Patient not a candidate for NSAIDs due to anticoagulants.  Continue pain management and PT as patient can tolerate.

## 2018-05-26 NOTE — Clinical Social Work Note (Signed)
Clinical Social Work Assessment  Patient Details  Name: Kyle Richardson MRN: [id number] Date of Birth: July 26, 1928  Date of referral:  05/26/18               Reason for consult:  Facility Placement                Permission sought to share information with:  Chartered certified accountant granted to share information::  No  Name::        Agency::     Relationship::     Contact Information:     Housing/Transportation Living arrangements for the past 2 months:  Single Family Home Source of Information:  Medical Team, Adult Children Patient Interpreter Needed:  None Criminal Activity/Legal Involvement Pertinent to Current Situation/Hospitalization:  No - Comment as needed Significant Relationships:  Adult Children, Spouse, Siblings Lives with:  Spouse Do you feel safe going back to the place where you live?  Yes Need for family participation in patient care:  Yes (Comment)(Patient is somnulent and not oriented past self and place.)  Care giving concerns: PT recommendation for SNF; 4 admission in 6 months   Social Worker assessment / plan:  The CSW attempted to meet with the patient and family at bedside. No family was present and the patient was sleeping soundly. The CSW contacted the patient's daughter, Jackelyn Poling, and explained the PT recommendation. Debbie shared that the patient has presented this way for PT at each admission but made rapid progress after sedating medications had worn off. Debbie and her family would like to wait until tomorrow and another PT visit before making a decision about discharge to SNF. Currently, they would like to pursue Luke being restarted at discharge. The CSW will continue to follow pending changes to family decision.  Employment status:  Retired Nurse, adult PT Recommendations:  [place] / Referral to community resources:     Patient/Family's Response to care: The  patient's daughter thanked CSW for contact.  Patient/Family's Understanding of and Emotional Response to Diagnosis, Current Treatment, and Prognosis:  The patient's family is quite involved in the patient's care and are not in agreement with SNF at this time; however, they are open to such should the patient make no further progress with mentation and mobility in the next day or so.  Emotional Assessment Appearance:  Appears stated age Attitude/Demeanor/Rapport:  Lethargic Affect (typically observed):  Stable Orientation:  Oriented to Self, Oriented to Place Alcohol / Substance use:  Never Used Psych involvement (Current and /or in the community):  No (Comment)  Discharge Needs  Concerns to be addressed:  Care Coordination, Discharge Planning Concerns Readmission within the last 30 days:  Yes Current discharge risk:  Chronically ill Barriers to Discharge:  Continued Medical Work up   [employer], LCSW 05/26/2018, 3:37 PM

## 2018-05-26 NOTE — Progress Notes (Signed)
No overnight head-to-toe nursing assessment was charted on this patient for some reason. Will chart one for dayshift now. Wenda Low [facility]

## 2018-05-26 NOTE — Plan of Care (Signed)
  Problem: Education: Goal: Knowledge of General Education information will improve Description Including pain rating scale, medication(s)/side effects and non-pharmacologic comfort measures Outcome: Not Progressing Note:  Neurological status not adequate enough to meet this goal at this time (hx of 2 CVA's earlier this year). Will continue to monitor. Kyle Richardson [facility]

## 2018-05-26 NOTE — Progress Notes (Signed)
Physical Therapy Treatment Patient Details Name: Kyle Richardson MRN: [id number] DOB: June 16, 1928 Today's Date: 05/26/2018    History of Present Illness Pt is an 84 yo male s/p R Carotid stent placement. The carotid stenosis was identified in July when he was brought to the emergency room with right arm weakness and right eye visual changes.  At that time it was identified that he had had a 30mm infarct to R temporal lobe, subacute/chronic infarcts to L PCA, L BG and L thalamus. pt was admitted to CIR at the time and made great improvement nearing baseline functioning. Since procedure pt has been very confused and agitiated. Pt neuro deficits are returning to levels pre CIR.     PT Comments    Pt is again able to participate with limited supine and seated exercises today with therapy. He is very somnolent during session and requires regular direction and waking. He continues to require considerable assistance for bed mobility and is initially unsteady sitting at EOB however improved from yesterday. He requires heavy cues for sequencing with therapist for transfers. He is able to remain standing for approximately 30s again today but with heavy posterior lean. Heavy cues for anterior weight shifting but pt continues to support back of legs on bed. Unsafe/unable to attempt ambulation at this time. Pt will benefit from PT services to address deficits in strength, balance, and mobility in order to return to full function at home.    Follow Up Recommendations  SNF     Equipment Recommendations  Rolling walker with 5" wheels    Recommendations for Other Services       Precautions / Restrictions Precautions Precautions: Fall Restrictions Weight Bearing Restrictions: No Other Position/Activity Restrictions: R sided weakness. R shoulder pain with movement above 90 degrees.    Mobility  Bed Mobility Overal bed mobility: Needs Assistance Bed Mobility: Supine to Sit     Supine to sit: Mod assist      General bed mobility comments: Heavy cues for sequencing with bed mobility. Improved sitting balance today but still requires support in sitting initially  Transfers Overall transfer level: Needs assistance Equipment used: None Transfers: Sit to/from Stand Sit to Stand: Mod assist         General transfer comment: Pt requires heavy cues for sequencing with therapist. He is able to remain standing for approximately 30s again today but with heavy posterior lean. Heavy cues for anterior weight shifting but pt continues to support back of legs on bed. Unsafe/unable to attempt ambulation at this time.  Ambulation/Gait             General Gait Details: Unable to assess today--pt not appropriate to stand/walk currerently    Stairs             Wheelchair Mobility    Modified Rankin (Stroke Patients Only)       Balance Overall balance assessment: Needs assistance Sitting-balance support: Feet supported;Bilateral upper extremity supported Sitting balance-Leahy Scale: Poor Sitting balance - Comments: Initially poor but eventually improves to CGA only Postural control: Posterior lean;Right lateral lean Standing balance support: No upper extremity supported Standing balance-Leahy Scale: Poor Standing balance comment: Requires continual support in standing                            Cognition Arousal/Alertness: Lethargic Behavior During Therapy: Restless Overall Cognitive Status: Impaired/Different from baseline Area of Impairment: Orientation;Attention;Awareness;Problem solving;Following commands  Current Attention Level: Alternating   Following Commands: Follows one step commands inconsistently     Problem Solving: Slow processing;Requires tactile cues;Requires verbal cues;Difficulty sequencing;Decreased initiation        Exercises General Exercises - Lower Extremity Ankle Circles/Pumps: Both;5 reps Long Arc Quad: Both;10  reps Straight Leg Raises: Both;10 reps Hip Flexion/Marching: Both;10 reps    General Comments        Pertinent Vitals/Pain Pain Assessment: Faces Faces Pain Scale: No hurt    Home Living                      Prior Function            PT Goals (current goals can now be found in the care plan section) Acute Rehab PT Goals Patient Stated Goal: pt unable to state, spouse wants pt to return to PLOF PT Goal Formulation: Patient unable to participate in goal setting Progress towards PT goals: Not progressing toward goals - comment(Pt remains limited by somnolence and confusion)    Frequency    7X/week      PT Plan Discharge plan needs to be updated    Co-evaluation              AM-PAC PT "6 Clicks" Daily Activity  Outcome Measure  Difficulty turning over in bed (including adjusting bedclothes, sheets and blankets)?: Unable Difficulty moving from lying on back to sitting on the side of the bed? : Unable Difficulty sitting down on and standing up from a chair with arms (e.g., wheelchair, bedside commode, etc,.)?: Unable Help needed moving to and from a bed to chair (including a wheelchair)?: Total Help needed walking in hospital room?: Total Help needed climbing 3-5 steps with a railing? : Total 6 Click Score: 6    End of Session Equipment Utilized During Treatment: Gait belt Activity Tolerance: Treatment limited secondary to agitation Patient left: in bed;with bed alarm set;with family/visitor present   PT Visit Diagnosis: Unsteadiness on feet (R26.81);Muscle weakness (generalized) (M62.81);Hemiplegia and hemiparesis;Pain Hemiplegia - Right/Left: Right Hemiplegia - dominant/non-dominant: Dominant Hemiplegia - caused by: Cerebral infarction Pain - Right/Left: Right Pain - part of body: Shoulder     Time: 9371-6967 PT Time Calculation (min) (ACUTE ONLY): 16 min  Charges:  $Therapeutic Exercise: 8-22 mins                    Lyndel Safe Tywanna Seifer PT,  DPT, GCS    Buffy Ehler 05/26/2018, 1:58 PM

## 2018-05-26 NOTE — NC FL2 (Signed)
[place] LEVEL OF CARE SCREENING TOOL     IDENTIFICATION  Patient Name: Kyle Richardson Birthdate: 1927-11-20 Sex: male Admission Date (Current Location): 05/22/2018  [place] and [place] Number:  Engineering geologist and Address:  [facility], [address] [place], [place] [address]      Provider Number: [id number]  Attending Physician Name and Address:  Katha Cabal, MD  Relative Name and Phone Number:  Lopez Dentinger (Spouse) [phone] or [name] (Daughter) [phone]    Current Level of Care: Hospital Recommended Level of Care: [place] Prior Approval Number:    Date Approved/Denied:   PASRR Number: [id number]  Discharge Plan: SNF    Current Diagnoses: Patient Active Problem List   Diagnosis Date Noted  . Encephalopathy   . Carotid stenosis, symptomatic, with infarction ([place]) 05/22/2018  . TIA (transient ischemic attack) 04/15/2018  . Episode of confusion 04/08/2018  . Labile blood pressure   . Acute ischemic left PCA stroke ([place]) 02/27/2018  . Diabetes mellitus type 2 in nonobese (HCC)   . Glaucoma   . MG, ocular (myasthenia gravis) ([place])   . CVA (cerebral vascular accident) ([place]) 02/24/2018  . Right homonymous hemianopsia 02/23/2018  . Unresponsive episode 02/22/2018  . UTI (urinary tract infection) 01/28/2018  . Abnormal chest CT 09/20/2017  . Double vision 01/21/2017  . Ascending aortic aneurysm ([place]) 12/22/2016  . Blood-tinged sputum 10/23/2016  . Cough 10/23/2016  . PAF (paroxysmal atrial fibrillation) ([place]) 09/14/2016  . Health care maintenance 12/26/2014  . Stress 07/05/2014  . Migraine 05/10/2013  . Type 2 diabetes mellitus with complication, without long-term current use of insulin ([place]) 08/20/2012  . Hypertension 08/20/2012  . Hypercholesterolemia 08/20/2012  . CAD (coronary artery disease) 08/20/2012    Orientation RESPIRATION BLADDER Height & Weight     Self, Place,  Situation  Normal Incontinent Weight: 170 lb 10.2 oz (77.4 kg) Height:  5\' 9"  (175.3 cm)  BEHAVIORAL SYMPTOMS/MOOD NEUROLOGICAL BOWEL NUTRITION STATUS      Continent Diet(Dysphagia 3, thin liquids)  AMBULATORY STATUS COMMUNICATION OF NEEDS Skin   Extensive Assist Verbally Surgical wounds                       Personal Care Assistance Level of Assistance  Bathing, Feeding, Dressing Bathing Assistance: Limited assistance Feeding assistance: Independent Dressing Assistance: Limited assistance     Functional Limitations Info  Sight, Hearing, Speech Sight Info: Adequate Hearing Info: Adequate Speech Info: Adequate    SPECIAL CARE FACTORS FREQUENCY  PT (By licensed PT), OT (By licensed OT)                    Contractures Contractures Info: Not present    Additional Factors Info  Code Status, Allergies Code Status Info: Full Allergies Info: Clarithromycin, Codeine, Flomax Tamsulosin Hcl, Lamisil Terbinafine, Levofloxacin, Sertraline, Sulfa Antibiotics, Versed Midazolam           Current Medications (05/26/2018):  This is the current hospital active medication list Current Facility-Administered Medications  Medication Dose Route Frequency Provider Last Rate Last Dose  . 0.9 %  sodium chloride infusion   Intravenous PRN Tukov-Yual, Arlyss Gandy, NP   Stopped at 05/24/18 0500  . acetaminophen (TYLENOL) tablet 325-650 mg  325-650 mg Oral Q4H PRN Tukov-Yual, Magdalene S, NP       Or  . acetaminophen (TYLENOL) suppository 325-650 mg  325-650 mg Rectal Q4H PRN Tukov-Yual, Arlyss Gandy, NP      .  alum & mag hydroxide-simeth (MAALOX/MYLANTA) 200-200-20 MG/5ML suspension 15-30 mL  15-30 mL Oral Q2H PRN Tukov-Yual, Magdalene S, NP   30 mL at 05/24/18 2021  . apixaban (ELIQUIS) tablet 5 mg  5 mg Oral BID Tukov-Yual, Magdalene S, NP   5 mg at 05/26/18 0915  . aspirin tablet 162 mg  162 mg Oral Daily Tukov-Yual, Magdalene S, NP   162 mg at 05/26/18 0913  . atorvastatin (LIPITOR)  tablet 40 mg  40 mg Oral q1800 Tukov-Yual, Magdalene S, NP   40 mg at 05/25/18 1732  . clopidogrel (PLAVIX) tablet 75 mg  75 mg Oral Daily Tukov-Yual, Magdalene S, NP   75 mg at 05/26/18 0914  . diazepam (VALIUM) injection 2.5 mg  2.5 mg Intravenous Q6H PRN Tukov-Yual, Magdalene S, NP   2.5 mg at 05/26/18 0141  . docusate sodium (COLACE) capsule 100 mg  100 mg Oral Daily Tukov-Yual, Magdalene S, NP   100 mg at 05/26/18 0915  . famotidine (PEPCID) tablet 20 mg  20 mg Oral BID Tukov-Yual, Magdalene S, NP   20 mg at 05/26/18 0915  . FLUoxetine (PROZAC) capsule 10 mg  10 mg Oral Daily Tukov-Yual, Magdalene S, NP   10 mg at 05/26/18 0915  . fluticasone (FLONASE) 50 MCG/ACT nasal spray 2 spray  2 spray Each Nare Daily Tukov-Yual, Magdalene S, NP   2 spray at 05/26/18 0915  . guaiFENesin-dextromethorphan (ROBITUSSIN DM) 100-10 MG/5ML syrup 15 mL  15 mL Oral Q4H PRN Tukov-Yual, Magdalene S, NP      . hydrALAZINE (APRESOLINE) injection 5 mg  5 mg Intravenous Q20 Min PRN Tukov-Yual, Magdalene S, NP      . labetalol (NORMODYNE,TRANDATE) injection 10 mg  10 mg Intravenous Q10 min PRN Tukov-Yual, Magdalene S, NP      . latanoprost (XALATAN) 0.005 % ophthalmic solution 1 drop  1 drop Both Eyes QHS Tukov-Yual, Magdalene S, NP   1 drop at 05/25/18 2140  . loratadine (CLARITIN) tablet 10 mg  10 mg Oral Daily Tukov-Yual, Magdalene S, NP   10 mg at 05/26/18 0915  . metoprolol succinate (TOPROL-XL) 24 hr tablet 12.5 mg  12.5 mg Oral Daily Tukov-Yual, Magdalene S, NP   12.5 mg at 05/26/18 0914  . metoprolol tartrate (LOPRESSOR) injection 2-5 mg  2-5 mg Intravenous Q2H PRN Tukov-Yual, Magdalene S, NP      . morphine 4 MG/ML injection 2-5 mg  2-5 mg Intravenous Q1H PRN Tukov-Yual, Magdalene S, NP   4 mg at 05/24/18 1343  . nitroGLYCERIN (NITROSTAT) SL tablet 0.4 mg  0.4 mg Sublingual Q5 min PRN Tukov-Yual, Magdalene S, NP      . ondansetron (ZOFRAN) injection 4 mg  4 mg Intravenous Q6H PRN Tukov-Yual, Magdalene S, NP   4  mg at 05/22/18 1612  . phenol (CHLORASEPTIC) mouth spray 1 spray  1 spray Mouth/Throat PRN Tukov-Yual, Magdalene S, NP      . traMADol (ULTRAM) tablet 50 mg  50 mg Oral Q6H PRN Tukov-Yual, Magdalene S, NP   50 mg at 05/25/18 1732  . valproate (DEPACON) 250 mg in dextrose 5 % 50 mL IVPB  250 mg Intravenous Q12H Tukov-Yual, Magdalene S, NP   Stopped at 05/26/18 1040     Discharge Medications: Please see discharge summary for a list of discharge medications.  Relevant Imaging Results:  Relevant Lab Results:   Additional Information [phone]  Zettie Pho, LCSW

## 2018-05-27 DIAGNOSIS — G9340 Encephalopathy, unspecified: Secondary | ICD-10-CM

## 2018-05-27 DIAGNOSIS — I69359 Hemiplegia and hemiparesis following cerebral infarction affecting unspecified side: Secondary | ICD-10-CM | POA: Insufficient documentation

## 2018-05-27 LAB — URINE CULTURE: Culture: NO GROWTH

## 2018-05-27 MED ORDER — SODIUM CHLORIDE 0.9% FLUSH
3.0000 mL | Freq: Two times a day (BID) | INTRAVENOUS | Status: DC
  Administered 2018-05-27 – 2018-05-31 (×7): 3 mL via INTRAVENOUS

## 2018-05-27 NOTE — Progress Notes (Signed)
[name]  Daily Progress Note   Subjective  - 5 Days Post-Op  Patient much more alert sitting in his bed this evening he is eaten supper he is still confused but clearly much improved.  Objective Vitals:   05/26/18 0404 05/26/18 0744 05/27/18 0358 05/27/18 0941  BP: (!) 128/51 (!) 167/101 (!) 141/69 140/67  Pulse: (!) 53 81 (!) 56 (!) 57  Resp: 18  18 16   Temp:  98.5 F (36.9 C) 98.2 F (36.8 C) 97.7 F (36.5 C)  TempSrc:  Oral Oral Oral  SpO2: 95% 95% 95% 98%  Weight:      Height:        Intake/Output Summary (Last 24 hours) at 05/27/2018 1756 Last data filed at 05/27/2018 1200 Gross per 24 hour  Intake 240 ml  Output 1400 ml  Net -1160 ml    PULM  Normal effort , no use of accessory muscles CV  No JVD, RRR Abd      No distended, nontender VASC  follows commands right arm remains weak but this is his baseline left arm and both legs are moving appropriately  Laboratory CBC    Component Value Date/Time   WBC 5.8 05/26/2018 1118   HGB 13.0 05/26/2018 1118   HGB 13.6 03/23/2014 2024   HCT 38.0 (L) 05/26/2018 1118   HCT 40.9 03/23/2014 2024   PLT 147 (L) 05/26/2018 1118   PLT 182 03/23/2014 2024    BMET    Component Value Date/Time   NA 139 05/26/2018 1118   NA 140 03/23/2014 2024   K 4.2 05/26/2018 1118   K 3.8 03/23/2014 2024   CL 106 05/26/2018 1118   CL 104 03/23/2014 2024   CO2 26 05/26/2018 1118   CO2 27 03/23/2014 2024   GLUCOSE 151 (H) 05/26/2018 1118   GLUCOSE 156 04/16/2015 1220   BUN 5 (L) 05/26/2018 1118   BUN 13 03/23/2014 2024   CREATININE 0.69 05/26/2018 1118   CREATININE 0.97 03/23/2014 2024   CALCIUM 8.3 (L) 05/26/2018 1118   CALCIUM 8.7 03/23/2014 2024   GFRNONAA >60 05/26/2018 1118   GFRNONAA >60 03/23/2014 2024   GFRAA >60 05/26/2018 1118   GFRAA >60 03/23/2014 2024    Assessment/Planning: POD #5 s/p right carotid stent placement x2 for tandem lesions   Continue dual antiplatelet therapy.  I will  move forward with inpatient rehab services.  Kyle Richardson  05/27/2018, 5:56 PM

## 2018-05-27 NOTE — Progress Notes (Signed)
Appears to be improving and has eaten today   Past Medical History:  Diagnosis Date  . Abdominal fibromatosis   . 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  s/p PTCA of the mid RCA 1/96, also  s/p  CABG x 4 7/96  . Complication of anesthesia    confusion and hallucinations for several days after receiving Versed  . Degenerative disc disease   . Diabetes mellitus ([place])   . Glaucoma   . Hypercholesterolemia   . Hypertension   . Migraine headache    history of  . Myocardial infarction ([place]) 1996  . Osteoarthritis   . Sleep apnea   . Stroke [facility]) 02/22/2017   affected left side     Past Surgical History:  Procedure Laterality Date  . APPENDECTOMY    . CAROTID ANGIOGRAPHY Bilateral 04/19/2018   Procedure: CAROTID ANGIOGRAPHY;  Surgeon: Algernon Huxley, MD;  Location: [facility];  Service: Cardiovascular;  Laterality: Bilateral;  . CAROTID PTA/STENT INTERVENTION Right 05/22/2018   Procedure: CAROTID PTA/STENT INTERVENTION;  Surgeon: Katha Cabal, MD;  Location: [facility];  Service: Cardiovascular;  Laterality: Right;  . CATARACT EXTRACTION W/ INTRAOCULAR LENS  IMPLANT, BILATERAL    . CHOLECYSTECTOMY     Removed  . CORONARY ARTERY BYPASS GRAFT  7/96   x4  . HEMORRHOID SURGERY    . HERNIA REPAIR    . TONSILLECTOMY    . UPP and tonsillectomy  1/86    Family History  Problem Relation Age of Onset  . Heart disease Mother        died age 65  . Heart disease Father        and other vascular disease  . Diabetes Unknown        four siblings  . Heart disease Brother        s/p CABG  . Colon cancer Neg Hx   . Prostate cancer Neg Hx     Social History:  reports that he has never smoked. He has never used smokeless tobacco. He reports that he does not drink alcohol or use drugs.  Allergies  Allergen Reactions  . Clarithromycin Diarrhea  . Codeine Other (See Comments)    Unknown  . Flomax [Tamsulosin Hcl] Other (See  Comments)    Unknown  . Lamisil [Terbinafine] Other (See Comments)    Unknown  . Levofloxacin Other (See Comments)    Diplopia. NEVER put patient on levoquin!  . Sertraline Other (See Comments)    Unknown  . Sulfa Antibiotics Other (See Comments)    Unknown  . Versed [Midazolam] Other (See Comments)    Hallucinations and confusion    Medications:  I have reviewed the patient's current medications. Prior to Admission:  Medications Prior to Admission  Medication Sig Dispense Refill Last Dose  . acetaminophen (TYLENOL) 500 MG tablet Take 1 tablet (500 mg total) by mouth every 6 (six) hours as needed. (Patient taking differently: Take 500 mg by mouth every 6 (six) hours as needed for moderate pain or headache. ) 30 tablet 0 Past Month at Unknown time  . apixaban (ELIQUIS) 5 MG TABS tablet Take 1 tablet (5 mg total) by mouth 2 (two) times daily. 30 tablet 1 05/21/2018 at Unknown time  . atorvastatin (LIPITOR) 40 MG tablet Take 1 tablet (40 mg total) by mouth daily. (Patient taking differently: Take 40 mg by mouth at bedtime. ) 30 tablet 1 05/21/2018 at Unknown time  .  divalproex (DEPAKOTE ER) 500 MG 24 hr tablet Take 1 tablet (500 mg total) by mouth daily. (Patient taking differently: Take 500 mg by mouth at bedtime. ) 30 tablet 1 05/21/2018 at Unknown time  . FLUoxetine (PROZAC) 10 MG capsule Take 1 capsule (10 mg total) by mouth daily. 30 capsule 3 05/22/2018 at Unknown time  . fluticasone (FLONASE) 50 MCG/ACT nasal spray SPRAY 2 SPRAYS IN EACH NOSTRIL ONCE A DAY (Patient taking differently: SPRAY 1 SPRAY IN EACH NOSTRIL ONCE A DAY AT BEDTIME) 16 g 2 05/21/2018 at Unknown time  . latanoprost (XALATAN) 0.005 % ophthalmic solution Place 1 drop into both eyes at bedtime.   05/22/2018 at Unknown time  . loratadine (CLARITIN) 10 MG tablet Take 10 mg by mouth daily.   05/22/2018 at Unknown time  . metoprolol succinate (TOPROL-XL) 25 MG 24 hr tablet Take 0.5 tablets (12.5 mg total) by mouth daily. 30  tablet 1 05/22/2018 at Unknown time  . Multiple Vitamins-Minerals (PRESERVISION AREDS 2 PO) Take 1 capsule by mouth 2 (two) times daily.   05/22/2018 at Unknown time  . nitroGLYCERIN (NITROSTAT) 0.4 MG SL tablet Place 0.4 mg under the tongue every 5 (five) minutes as needed for chest pain.    Past Month at Unknown time   Scheduled: . apixaban  5 mg Oral BID  . aspirin  162 mg Oral Daily  . atorvastatin  40 mg Oral q1800  . clopidogrel  75 mg Oral Daily  . docusate sodium  100 mg Oral Daily  . famotidine  20 mg Oral BID  . FLUoxetine  10 mg Oral Daily  . fluticasone  2 spray Each Nare Daily  . latanoprost  1 drop Both Eyes QHS  . loratadine  10 mg Oral Daily  . metoprolol succinate  12.5 mg Oral Daily    ROS: Unable to provide due to mental status  Physical Examination: Blood pressure 140/67, pulse (!) 57, temperature 97.7 F (36.5 C), temperature source Oral, resp. rate 16, height 5\' 9"  (1.753 m), weight 77.4 kg, SpO2 98 %.  Unable to examine extensively due to aggressive and combative behavior.  Patient extensively agitated due to my presence.    Neurological Examination  Mental Status: Alert. Confused.  Combative. Agitated Cranial Nerves: No facial asymmetry noted. Motor: Moves both upper extremities against gravity.     Laboratory Studies:   Basic Metabolic Panel: Recent Labs  Lab 05/21/18 1047 05/23/18 0214 05/26/18 1118  NA  --  140 139  K  --  4.2 4.2  CL  --  106 106  CO2  --  28 26  GLUCOSE  --  147* 151*  BUN 13 11 5*  CREATININE 0.80 0.82 0.69  CALCIUM  --  8.5* 8.3*    Liver Function Tests: Recent Labs  Lab 05/26/18 1118  AST 26  ALT 19  ALKPHOS 46  BILITOT 0.9  PROT 5.6*  ALBUMIN 2.6*   No results for input(s): LIPASE, AMYLASE in the last 168 hours. No results for input(s): AMMONIA in the last 168 hours.  CBC: Recent Labs  Lab 05/23/18 0214 05/26/18 1118  WBC 7.5 5.8  NEUTROABS  --  3.3  HGB 13.4 13.0  HCT 39.1* 38.0*  MCV 95.9  95.9  PLT 145* 147*    Cardiac Enzymes: Recent Labs  Lab 05/22/18 0747  TROPONINI <0.03    BNP: Invalid input(s): POCBNP  CBG: Recent Labs  Lab 05/22/18 1957 05/22/18 2357 05/23/18 0402 05/23/18 0758 05/25/18 1136  [place]    Microbiology: Results for orders placed or performed during the hospital encounter of 05/22/18  MRSA PCR Screening     Status: None   Collection Time: 05/22/18 12:30 PM  Result Value Ref Range Status   MRSA by PCR NEGATIVE NEGATIVE Final    Comment:        The GeneXpert MRSA Assay (FDA approved for NASAL specimens only), is one component of a comprehensive MRSA colonization surveillance program. It is not intended to diagnose MRSA infection nor to guide or monitor treatment for MRSA infections. Performed at [facility], [address] [place], [place] [address]   CULTURE, BLOOD (ROUTINE X 2) w Reflex to ID Panel     Status: None (Preliminary result)   Collection Time: 05/26/18 11:18 AM  Result Value Ref Range Status   Specimen Description BLOOD RT [facility]  Final   Special Requests   Final    BOTTLES DRAWN AEROBIC AND ANAEROBIC Blood Culture adequate volume   Culture   Final    NO GROWTH < 24 HOURS Performed at [facility], [address] [place], [place] [address]    Report Status PENDING  Incomplete  CULTURE, BLOOD (ROUTINE X 2) w Reflex to ID Panel     Status: None (Preliminary result)   Collection Time: 05/26/18 11:33 AM  Result Value Ref Range Status   Specimen Description BLOOD RT HAND  Final   Special Requests   Final    BOTTLES DRAWN AEROBIC AND ANAEROBIC Blood Culture adequate volume   Culture   Final    NO GROWTH < 24 HOURS Performed at [facility], [address] [place], [place] [address]    Report Status PENDING  Incomplete    Coagulation Studies: No results for input(s): LABPROT, INR in the last 72 hours.  Urinalysis:  Recent Labs  Lab 05/26/18 1355   COLORURINE STRAW*  LABSPEC 1.004*  PHURINE 8.0  GLUCOSEU NEGATIVE  HGBUR NEGATIVE  BILIRUBINUR NEGATIVE  KETONESUR NEGATIVE  PROTEINUR NEGATIVE  NITRITE NEGATIVE  LEUKOCYTESUR TRACE*    Lipid Panel:     Component Value Date/Time   CHOL 159 04/16/2018 0558   CHOL 129 08/13/2013 1026   TRIG 288 (H) 04/16/2018 0558   TRIG 166 08/13/2013 1026   HDL 35 (L) 04/16/2018 0558   HDL 47 08/13/2013 1026   CHOLHDL 4.5 04/16/2018 0558   VLDL 58 (H) 04/16/2018 0558   VLDL 33 08/13/2013 1026   LDLCALC 66 04/16/2018 0558   LDLCALC 49 08/13/2013 1026    HgbA1C:  Lab Results  Component Value Date   HGBA1C 6.8 (H) 04/16/2018    Urine Drug Screen:  No results found for: LABOPIA, COCAINSCRNUR, LABBENZ, AMPHETMU, THCU, LABBARB  Alcohol Level: No results for input(s): ETH in the last 168 hours.  Other results: EKG: sinus rhythm at 69 bpm with PVC's.  Imaging: No results found.   Assessment/Plan: 84 year old male with recent infarcts now 2 days s/p right carotid stent placement.  Patient with history of worsening cognition from last hospitalization.  Current presentation may represent continued progression of cognitive issues that have been escalated by hospitalization and recent procedure.  Can not rule out recurrent infarct as well since patient has a history of strokes on anticoagulation.   Head CT shows no acute changes.  Lab work unremarkable.  Since appears to be improving and no clear new focality on exam would not do any further imaging such as  MRI Observation  Con't anticoagulation

## 2018-05-27 NOTE — Progress Notes (Signed)
Inpatient Rehabilitation  Re-screened patient and note that PT has changes their recommendations to SNF over the weekend and CSW is following.  Will stop following at along at this time, please call and notify if patient's tolerance improves and recommendations change to CIR again.  Thank you.   Carmelia Roller., CCC/SLP Admission Coordinator  [facility]  Cell [phone]

## 2018-05-27 NOTE — Progress Notes (Signed)
Occupational Therapy Treatment Patient Details Name: Kyle Richardson MRN: [id number] DOB: 1928/06/28 Today's Date: 05/27/2018    History of present illness Pt is an 84 yo male s/p R Carotid stent placement. The carotid stenosis was identified in July when he was brought to the emergency room with right arm weakness and right eye visual changes.  At that time it was identified that he had had a 27mm infarct to R temporal lobe, subacute/chronic infarcts to L PCA, L BG and L thalamus. pt was admitted to CIR at the time and made great improvement nearing baseline functioning. Since procedure pt has been very confused and agitiated. Pt neuro deficits are returning to levels pre CIR.    OT comments  Pt seen for OT tx this date. Pt more alert this date, able to follow commands more consistently, and attends to therapist t/o session with cues particularly when therapist is on R side. Pt instructed in RUE shoulder flexion AAROM with shoulder supported and decreasing assist as he progressed with repetitions, x10. Pt performed grooming tasks with setup and max cues to incorporate RUE into bilateral tasks requiring initial mod assist decreasing to CGA-min assist for RUE. Pt able to perform rolling side to side in bed with minimal verbal cues and CGA to min assist. Pt progressing with therapy since initial evaluation for this hospitalization. Pt continues to benefit from skilled OT services; continuing to recommend CIR as most appropriate next venue of care to maximize return to PLOF.    Follow Up Recommendations  CIR    Equipment Recommendations  Other (comment)(TBD)    Recommendations for Other Services      Precautions / Restrictions Precautions Precautions: Fall Restrictions Weight Bearing Restrictions: No Other Position/Activity Restrictions: R sided weakness. R shoulder pain with movement above 90 degrees.       Mobility Bed Mobility Overal bed mobility: Needs Assistance Bed Mobility:  Rolling Rolling: Min guard;Min assist       General bed mobility comments: verbal cues and minimal assist for rolling side to side for CNA to perform hygiene and change linens      Balance                           ADL either performed or assessed with clinical judgement   ADL Overall ADL's : Needs assistance/impaired     Grooming: Sitting;Minimal assistance;Moderate assistance;Cueing for safety Grooming Details (indicate cue type and reason): long sitting in bed, pt able to blow his nose with a tissue, wring out a washcloth and wash face, and doff/don glasses all with initial min-mod assist for RUE involvement to initiate decreasing to CGA to min assist to complete tasks                                     Vision Baseline Vision/History: Wears glasses([facility]HH from previous CVA) Wears Glasses: At all times Patient Visual Report: No change from baseline Additional Comments: [facility] from previous CVA    Perception     Praxis      Cognition Arousal/Alertness: Awake/alert Behavior During Therapy: WFL for tasks assessed/performed Overall Cognitive Status: Impaired/Different from baseline Area of Impairment: Orientation;Attention;Awareness;Problem solving;Following commands                 Orientation Level: Person;Situation Current Attention Level: Sustained   Following Commands: Follows one step commands with increased time  Problem Solving: Slow processing;Requires tactile cues;Requires verbal cues;Difficulty sequencing;Decreased initiation          Exercises Other Exercises Other Exercises: Pt participated in reaching activities using RUE with min-mod assist to grasp and reach across midline to pass family member items, decreasing to min assist with a couple repetitions Other Exercises: RUE Shoulder flexion AAROM (mod assist to support decreasing to fingertip to min assist and cues) x10   Shoulder Instructions       General Comments       Pertinent Vitals/ Pain       Pain Assessment: Faces Faces Pain Scale: Hurts a little bit Pain Location: R shoulder with ROM near 90 degrees Pain Descriptors / Indicators: Grimacing Pain Intervention(s): Limited activity within patient's tolerance;Monitored during session;Repositioned  Home Living                                          Prior Functioning/Environment              Frequency  Min 3X/week        Progress Toward Goals  OT Goals(current goals can now be found in the care plan section)  Progress towards OT goals: Progressing toward goals  Acute Rehab OT Goals Patient Stated Goal: to return to PLOF OT Goal Formulation: With patient/family Time For Goal Achievement: 06/07/18 Potential to Achieve Goals: Good  Plan Discharge plan remains appropriate;Frequency remains appropriate    Co-evaluation                 AM-PAC PT "6 Clicks" Daily Activity     Outcome Measure   Help from another person eating meals?: A Lot Help from another person taking care of personal grooming?: A Lot Help from another person toileting, which includes using toliet, bedpan, or urinal?: A Lot Help from another person bathing (including washing, rinsing, drying)?: A Lot Help from another person to put on and taking off regular upper body clothing?: A Lot Help from another person to put on and taking off regular lower body clothing?: A Lot 6 Click Score: 12    End of Session    OT Visit Diagnosis: Other abnormalities of gait and mobility (R26.89);Hemiplegia and hemiparesis;Low vision, both eyes (H54.2);Other symptoms and signs involving cognitive function Hemiplegia - Right/Left: Right Hemiplegia - dominant/non-dominant: Dominant Hemiplegia - caused by: Cerebral infarction   Activity Tolerance Patient tolerated treatment well   Patient Left in bed;with call bell/phone within reach;with bed alarm set;with family/visitor present   Nurse Communication           Time: 7793-9030 OT Time Calculation (min): 34 min  Charges: OT General Charges $OT Visit: 1 Visit OT Treatments $Self Care/Home Management : 8-22 mins $Therapeutic Exercise: 8-22 mins  Jeni Salles, MPH, MS, OTR/L ascom [phone] 05/27/18, 5:10 PM

## 2018-05-27 NOTE — Progress Notes (Signed)
Physical Therapy Treatment Patient Details Name: Kyle Richardson MRN: [id number] DOB: 02/28/1928 Today's Date: 05/27/2018    History of Present Illness Pt is an 84 yo male s/p R Carotid stent placement. The carotid stenosis was identified in July when he was brought to the emergency room with right arm weakness and right eye visual changes.  At that time it was identified that he had had a 57mm infarct to R temporal lobe, subacute/chronic infarcts to L PCA, L BG and L thalamus. pt was admitted to CIR at the time and made great improvement nearing baseline functioning. Since procedure pt has been very confused and agitiated. Pt neuro deficits are returning to levels pre CIR.     PT Comments    Pt has progressed since last session when SNF recommendation was made. Pt is still not at baseline with cognitive impairments, but pt is following single step commands more consistently, and is now engaging in conversation with PT. Pt attention is still diminished but is sustained during activities. Pt needs to be directed to use and acknowledge R UE and R LE, but shows ability to do so with verbal and physical cues. Pt continues to need mod assistance with bed mobility and tranfers (see details below). However pt is improved in sitting balance and in ability to sustain standing. Pt took lateral steps with max assist from therapist. Pt is very anxious and unsteady with this activity but was able to tolerate it. Pt is showing improvement each session, and in a similar fashion to his previous rehabilitation experiences is responding well to PT. Pt is an excellent candidate for CIR, as his activity tolerance is at a level congruent with CIR demands, and his prognosis for benefit from intensive therapy is high. In addition pt has excellent support from family. Would continue to benefit from skilled PT to address above deficits and promote optimal return to PLOF. Recommend transition to acute inpatient rehab upon discharge  for high-intensity, post-acute rehab services.   Follow Up Recommendations  CIR     Equipment Recommendations  Rolling walker with 5" wheels    Recommendations for Other Services       Precautions / Restrictions Precautions Precautions: Fall Restrictions Weight Bearing Restrictions: No Other Position/Activity Restrictions: R sided weakness. R shoulder pain with movement above 90 degrees.    Mobility  Bed Mobility Overal bed mobility: Needs Assistance Bed Mobility: Supine to Sit     Supine to sit: Mod assist     General bed mobility comments: Heavy cues for sequencing with bed mobility. Improved sitting balance today but still requires support in sitting initially. Able to maiantain sitting ind after initially gaining balance.   Transfers Overall transfer level: Needs assistance Equipment used: None Transfers: Sit to/from Stand Sit to Stand: Mod assist         General transfer comment: Pt requires cues for ant weight shift at trunk; blocking at L knee for LLE support, and tactile cue at pelvis for hip extension and midline positioning of trunk  Ambulation/Gait Ambulation/Gait assistance: Max assist Gait Distance (Feet): 2 Feet         General Gait Details: Pt performed lat stepping at EOB. Needed max assist for balance. Pt highly anxious and unstable, but improved from previous sessions.    Stairs             Wheelchair Mobility    Modified Rankin (Stroke Patients Only)       Balance Overall balance assessment: Needs assistance Sitting-balance support:  Feet supported Sitting balance-Leahy Scale: Fair Sitting balance - Comments: Pt able to sit on EOB CGA, occassionally drifts post/right but can be cued verbally to correct.  Postural control: Posterior lean;Right lateral lean Standing balance support: No upper extremity supported Standing balance-Leahy Scale: Poor Standing balance comment: Pt needs max assist consistently in standing. Blocking at  L knee and posterior pelvis support                            Cognition Arousal/Alertness: Awake/alert Behavior During Therapy: Restless Overall Cognitive Status: Impaired/Different from baseline Area of Impairment: Orientation;Attention;Awareness;Problem solving;Following commands                 Orientation Level: Person;Situation Current Attention Level: Sustained   Following Commands: Follows one step commands with increased time(Follows 1 step commands >60% of the time)     Problem Solving: Slow processing;Requires tactile cues;Requires verbal cues;Difficulty sequencing;Decreased initiation        Exercises Other Exercises Other Exercises: Supine to sit mod assist; sitting balance and reaching activities; Sit to stand; mod assist x5; Lat stepping at EOB x 5 steps max assist    General Comments        Pertinent Vitals/Pain Pain Assessment: Faces Faces Pain Scale: Hurts little more Pain Location: R shoulder and R hip Pain Descriptors / Indicators: Moaning;Grimacing;Guarding Pain Intervention(s): Monitored during session;Utilized relaxation techniques;Repositioned;Limited activity within patient's tolerance    Home Living                      Prior Function            PT Goals (current goals can now be found in the care plan section) Acute Rehab PT Goals Patient Stated Goal: to return to PLOF PT Goal Formulation: With patient/family Progress towards PT goals: Progressing toward goals    Frequency    7X/week      PT Plan Discharge plan needs to be updated    Co-evaluation              AM-PAC PT "6 Clicks" Daily Activity  Outcome Measure  Difficulty turning over in bed (including adjusting bedclothes, sheets and blankets)?: Unable Difficulty moving from lying on back to sitting on the side of the bed? : Unable Difficulty sitting down on and standing up from a chair with arms (e.g., wheelchair, bedside commode, etc,.)?:  Unable Help needed moving to and from a bed to chair (including a wheelchair)?: A Lot Help needed walking in hospital room?: Total Help needed climbing 3-5 steps with a railing? : Total 6 Click Score: 7    End of Session Equipment Utilized During Treatment: Gait belt Activity Tolerance: Patient limited by fatigue Patient left: in bed;with bed alarm set;with family/visitor present Nurse Communication: (Discussed d/c recomendations with CSW) PT Visit Diagnosis: Unsteadiness on feet (R26.81);Muscle weakness (generalized) (M62.81);Hemiplegia and hemiparesis;Pain Hemiplegia - Right/Left: Right Hemiplegia - dominant/non-dominant: Dominant Hemiplegia - caused by: Cerebral infarction Pain - Right/Left: Right Pain - part of body: Shoulder     Time: 1418-1450 PT Time Calculation (min) (ACUTE ONLY): 32 min  Charges:                       Hortencia Conradi, SPT 05/27/18,3:53 PM

## 2018-05-27 NOTE — Progress Notes (Signed)
Inpatient Rehabilitation  Received call from PT student and PT that patient has demonstrated improvements in ability to participate today and they feel that CIR is the most appropriate post acute rehab program for him.  Will follow for notes for insurance process.  Call if questions.   Carmelia Roller., CCC/SLP Admission Coordinator  [name]  Cell [phone]

## 2018-05-27 NOTE — Care Management Important Message (Signed)
Copy of signed IM left with patient in room.  

## 2018-05-28 ENCOUNTER — Ambulatory Visit: Payer: PPO

## 2018-05-28 MED ORDER — DIVALPROEX SODIUM 250 MG PO DR TAB
250.0000 mg | DELAYED_RELEASE_TABLET | Freq: Two times a day (BID) | ORAL | Status: DC
  Administered 2018-05-28 – 2018-05-31 (×6): 250 mg via ORAL
  Filled 2018-05-28 (×7): qty 1

## 2018-05-28 NOTE — Progress Notes (Signed)
Subjective: Patient is asleep but arouses easily to answer orientation question. Wife at bedside, she has noticed episodes of hallucination and confusion but otherwise easily redirected. Per nursing reports, he was restless and agitated last night but this improved with administration of Valium.  Objective: Current vital signs: BP (!) 157/77 (BP Location: Left Arm)   Pulse 68   Temp 97.8 F (36.6 C) (Oral)   Resp 16   Ht 5\' 9"  (1.753 m)   Wt 75.9 kg   SpO2 97%   BMI 24.71 kg/m  Vital signs in last 24 hours: Temp:  [97.8 F (36.6 C)-98.2 F (36.8 C)] 97.8 F (36.6 C) (08/20 0801) Pulse Rate:  [58-68] 68 (08/20 0801) Resp:  [16-18] 16 (08/20 0435) BP: (143-157)/(61-77) 157/77 (08/20 0801) SpO2:  [94 %-97 %] 97 % (08/20 0801) Weight:  [75.9 kg] 75.9 kg (08/20 0435)  Intake/Output from previous day: 08/19 0701 - 08/20 0700 In: 292.5 [P.O.:240; IV Piggyback:52.5] Out: 550 [Urine:550] Intake/Output this shift: Total I/O In: 240 [P.O.:240] Out: -  Nutritional status:  Diet Order            DIET DYS 3 Room service appropriate? Yes; Fluid consistency: Thin  Diet effective now             Physical Exam   Vitals Blood pressure (!) 157/77, pulse 68, temperature 97.8 F (36.6 C), temperature source Oral, resp. rate 16, height 5\' 9"  (1.753 m), weight 75.9 kg, SpO2 97 %.   General Exam . Patient looks appropriate of age, well built, nourished and appropriately groomed.  . Cardiovascular Exam: S1, S2 heart sounds present  . Carotid exam revealed no bruit  . Lung exam was clear to auscultation  .  Neurological Exam . Alert,  . Oriented to person but not to place, time or situation . Able to identify wife by name and relationship . Attention span and concentration seemed appropriate  . Language seemed intact (naming, spontaneous speech, comprehension)  . I. Olfactory not examined . II: Visual fields were full. Pupils were equal, round and reactive to light and  accommodation . III,IV, VI: ptosis not present, extra-ocular motions intact bilaterally . V,VII: smile symmetric, facial light touch sensation normal bilaterally . VIII: Finger rub was heard symmetric in both ears . IX, X: Palate and uvular movements are normal and oral sensations are OK, gag reflex deffered . XI: Neck muscle strength and shoulder shrug is normal . XII: midline tongue extension . Tone is normal in all extremities, no abnormal movements seen  . Muscle strength in all extremities seemed normal.  . Deep tendon reflexes were symmetric  . Sensations were intact to light touch in all extremities  . Gait and station not tested   Lab Results: Basic Metabolic Panel: Recent Labs  Lab 05/23/18 0214 05/26/18 1118  NA 140 139  K 4.2 4.2  CL 106 106  CO2 28 26  GLUCOSE 147* 151*  BUN 11 5*  CREATININE 0.82 0.69  CALCIUM 8.5* 8.3*    Liver Function Tests: Recent Labs  Lab 05/26/18 1118  AST 26  ALT 19  ALKPHOS 46  BILITOT 0.9  PROT 5.6*  ALBUMIN 2.6*   No results for input(s): LIPASE, AMYLASE in the last 168 hours. No results for input(s): AMMONIA in the last 168 hours.  CBC: Recent Labs  Lab 05/23/18 0214 05/26/18 1118  WBC 7.5 5.8  NEUTROABS  --  3.3  HGB 13.4 13.0  HCT 39.1* 38.0*  MCV 95.9 95.9  PLT 145* 147*    Cardiac Enzymes: Recent Labs  Lab 05/22/18 0747  TROPONINI <0.03    Lipid Panel: No results for input(s): CHOL, TRIG, HDL, CHOLHDL, VLDL, LDLCALC in the last 168 hours.  CBG: Recent Labs  Lab 05/22/18 1957 05/22/18 2357 05/23/18 0402 05/23/18 0758 05/25/18 1136  GLUCAP 134* 119* 133* 101* 107*    Microbiology: Results for orders placed or performed during the hospital encounter of 05/22/18  MRSA PCR Screening     Status: None   Collection Time: 05/22/18 12:30 PM  Result Value Ref Range Status   MRSA by PCR NEGATIVE NEGATIVE Final    Comment:        The GeneXpert MRSA Assay (FDA approved for NASAL specimens only), is  one component of a comprehensive MRSA colonization surveillance program. It is not intended to diagnose MRSA infection nor to guide or monitor treatment for MRSA infections. Performed at [facility], [address] [place], [place] [address]   CULTURE, BLOOD (ROUTINE X 2) w Reflex to ID Panel     Status: None (Preliminary result)   Collection Time: 05/26/18 11:18 AM  Result Value Ref Range Status   Specimen Description BLOOD RT [facility]  Final   Special Requests   Final    BOTTLES DRAWN AEROBIC AND ANAEROBIC Blood Culture adequate volume   Culture   Final    NO GROWTH 2 DAYS Performed at [facility], [address] [place], [place] [address]    Report Status PENDING  Incomplete  CULTURE, BLOOD (ROUTINE X 2) w Reflex to ID Panel     Status: None (Preliminary result)   Collection Time: 05/26/18 11:33 AM  Result Value Ref Range Status   Specimen Description BLOOD RT HAND  Final   Special Requests   Final    BOTTLES DRAWN AEROBIC AND ANAEROBIC Blood Culture adequate volume   Culture   Final    NO GROWTH 2 DAYS Performed at [facility], [address], [place], [place] [address]    Report Status PENDING  Incomplete  Urine Culture     Status: None   Collection Time: 05/26/18  1:55 PM  Result Value Ref Range Status   Specimen Description   Final    URINE, RANDOM Performed at [facility], [address], [place], [place] [address]    Special Requests   Final    NONE Performed at [facility], [address], [place], [place] [address]    Culture   Final    NO GROWTH Performed at [facility], [place] [address], [place], [place] [address]    Report Status 05/27/2018 FINAL  Final    Coagulation Studies: No results for input(s): LABPROT, INR in the last 72 hours.  Imaging: No results found.  Medications:  I have reviewed the patient's current medications. Scheduled: . apixaban  5 mg Oral BID  . aspirin  162 mg Oral  Daily  . atorvastatin  40 mg Oral q1800  . clopidogrel  75 mg Oral Daily  . divalproex  250 mg Oral Q12H  . docusate sodium  100 mg Oral Daily  . famotidine  20 mg Oral BID  . FLUoxetine  10 mg Oral Daily  . fluticasone  2 spray Each Nare Daily  . latanoprost  1 drop Both Eyes QHS  . loratadine  10 mg Oral Daily  . metoprolol succinate  12.5 mg Oral Daily  . sodium chloride flush  3 mL  Intravenous Q12H    Assessment: 84 year old male with prior left temporal and left PCA infarct now 6 days s/p right carotid stent placement. Patient with history of worsening cognition from last hospitalization.  Exam waxing and waning with intermittent confusion may represent continued progression of cognitive issues that have been escalated by hospitalization and recent procedure. Can not rule out recurrent infarct as well since patient has a history of strokes on anticoagulation.  Head CT shows no acute changes.  Lab work unremarkable. Considered Imaging however is unlikely that finding will change management.  Recommendations: 1. Will hold off MRI of the brain due to recent stent placement. Per Dr. Delana Meyer, prefer to hold off imaging due to recent stents unless warranted 2. Continue medical management with dual therapy Aspiri and Plavix 75 mg /day with intensive management of vascular risk factor to keep systolic BP (SBP) <790 mm Hg (130 mm Hg if diabetic) and low density lipoprotein (LDL) <70 mg/dl, and lifestyle modification. Continue high potency statin. 3. Patient to remain on anticoagulation for afib 4. Recommend to avoid benzos due to possible paradoxical worsening of neuropsychiatric symptoms.   This patient was staffed with Dr. Irish Elders, Alease Frame who personally evaluated patient, reviewed documentation and agreed with assessment and plan of care as above.  Rufina Falco, DNP, FNP-BC Board certified Nurse Practitioner Neurology Department    LOS: 6 days   05/28/2018  12:15 PM

## 2018-05-28 NOTE — Progress Notes (Signed)
Physical Therapy Treatment Patient Details Name: Kyle Richardson MRN: [id number] DOB: 1928-01-10 Today's Date: 05/28/2018    History of Present Illness Pt is an 84 yo male s/p R Carotid stent placement. The carotid stenosis was identified in July when he was brought to the emergency room with right arm weakness and right eye visual changes.  At that time it was identified that he had had a 13mm infarct to R temporal lobe, subacute/chronic infarcts to L PCA, L BG and L thalamus. pt was admitted to CIR at the time and made great improvement nearing baseline functioning. Since procedure pt has been very confused and agitiated. Pt neuro deficits are returning to levels pre CIR.     PT Comments    Pt continues to show progress with PT. Pt bed mobility is improving and pt is showing more confidence/requiring less cueing with sitting balance (details below). Performed sitting balance and reaching activity at EOB and was min assist +1 with trunk control and midline stability. Pt still shows R sided inattention and decreased sensation in RUE/RLE. Pt was able to perform sit to stand (x5) lateral stepping, and gait training (82ft) w/ mod assist 2+. Today was first time since hospital stay pt has been able to ambulate, pt was happy to have been able to do so.  Pt needs continued verbal and physical cues with all activities, but requires less than day previous. Pt was confused and somewhat disoriented on PT arrival, but after some activity and extended upright positioning, pt speech and cognition showed improvement. Pt was transitioned to recliner and bolster put under R ilium for improved pelvic and truncal alignment at end of session. Pt is continuing to show improvements with therapy each day, but is till significantly different than his baseline level. Continue to recommend transition to acute inpatient rehab upon discharge for high-intensity, post-acute rehab services.   Follow Up Recommendations  CIR      Equipment Recommendations  Rolling walker with 5" wheels    Recommendations for Other Services       Precautions / Restrictions Precautions Precautions: Fall Restrictions Weight Bearing Restrictions: No Other Position/Activity Restrictions: R sided weakness.     Mobility  Bed Mobility Overal bed mobility: Needs Assistance Bed Mobility: Supine to Sit     Supine to sit: Min assist     General bed mobility comments: verbal cues and minimal assist at trunk and BLE  Transfers Overall transfer level: Needs assistance Equipment used: None Transfers: Sit to/from Stand Sit to Stand: Mod assist;+2 physical assistance         General transfer comment: Pt requires cues for ant weight shift at trunk; blocking at L knee for LLE support, and tactile cue at pelvis for hip extension and midline positioning of trunk  Ambulation/Gait Ambulation/Gait assistance: Mod assist;+2 physical assistance Gait Distance (Feet): 8 Feet Assistive device: None       General Gait Details: Pt requires support at trunk and pelvis for weigth shifting lat/hip extension; pt needs VC's for proper sequencing of gait; pt needs mod assist from therapist for complete swing of RLE forward.    Stairs             Wheelchair Mobility    Modified Rankin (Stroke Patients Only)       Balance Overall balance assessment: Needs assistance Sitting-balance support: Feet supported Sitting balance-Leahy Scale: Fair Sitting balance - Comments: Pt able to sit on EOB CGA, occassionally drifts post/right but can be cued verbally to correct. Bolster put  under R ilium for improved pelvic and truncal alignment.  Postural control: Posterior lean;Right lateral lean Standing balance support: No upper extremity supported Standing balance-Leahy Scale: Poor Standing balance comment: Pt needs mod assist +2 consistently in standing. Blocking at L knee and posterior pelvis support             High level balance  activites: Side stepping High Level Balance Comments: Pt requires mod assist to shift weight, in particular to L side. Single shoe used on R foot to help facilitate shift to LLE.             Cognition Arousal/Alertness: Awake/alert Behavior During Therapy: WFL for tasks assessed/performed Overall Cognitive Status: Impaired/Different from baseline Area of Impairment: Orientation;Attention;Awareness;Problem solving;Following commands                 Orientation Level: Person;Situation Current Attention Level: Sustained   Following Commands: Follows one step commands with increased time     Problem Solving: Slow processing;Requires tactile cues;Requires verbal cues;Difficulty sequencing;Decreased initiation        Exercises Other Exercises Other Exercises: Supine to sit min assist; Sit to stand; mod assist +2 x5; Lat stepping at EOB x 8 steps mod assist +2; Forward walking 8 ft mod assist 2+  Other Exercises: sitting balance and reaching activities: Forward reaching on tray table with BIlat hands x 20--facilitating active trunk control, pt required less physical cueing with repetition. Bolster put under R ilium for improved pelvic and truncal alignment.     General Comments General comments (skin integrity, edema, etc.): Pt shows improved trunk control with reaching activity in sitting. Was able to rotate trunk more actvitiely and has less posterior lean than on previous days. Pt is able to self correct R lean with verbal and physical cues.       Pertinent Vitals/Pain Pain Assessment: No/denies pain    Home Living                      Prior Function            PT Goals (current goals can now be found in the care plan section) Acute Rehab PT Goals Patient Stated Goal: to return to PLOF Progress towards PT goals: Progressing toward goals    Frequency    7X/week      PT Plan Current plan remains appropriate    Co-evaluation               AM-PAC PT "6 Clicks" Daily Activity  Outcome Measure  Difficulty turning over in bed (including adjusting bedclothes, sheets and blankets)?: Unable Difficulty moving from lying on back to sitting on the side of the bed? : Unable Difficulty sitting down on and standing up from a chair with arms (e.g., wheelchair, bedside commode, etc,.)?: Unable Help needed moving to and from a bed to chair (including a wheelchair)?: A Lot Help needed walking in hospital room?: A Lot Help needed climbing 3-5 steps with a railing? : A Lot 6 Click Score: 9    End of Session Equipment Utilized During Treatment: Gait belt Activity Tolerance: Patient limited by fatigue Patient left: in chair;with chair alarm set;with family/visitor present;with call bell/phone within reach Nurse Communication: Mobility status PT Visit Diagnosis: Unsteadiness on feet (R26.81);Muscle weakness (generalized) (M62.81);Hemiplegia and hemiparesis;Pain Hemiplegia - Right/Left: Right Hemiplegia - dominant/non-dominant: Dominant Hemiplegia - caused by: Cerebral infarction Pain - Right/Left: Right Pain - part of body: Shoulder     Time: 2671-2458 PT Time Calculation (min) (  ACUTE ONLY): 42 min  Charges:                        Hortencia Conradi, SPT 05/28/18,1:01 PM

## 2018-05-28 NOTE — Progress Notes (Signed)
[facility]  Daily Progress Note   Subjective  - 6 Days Post-Op  Patient resting comfortably, he is still confused but clearly much improved over last week.  Objective Vitals:   05/27/18 2014 05/28/18 0435 05/28/18 0801 05/28/18 1637  BP: (!) 143/62 (!) 149/61 (!) 157/77 140/67  Pulse: 63 (!) 58 68 60  Resp: 18 16    Temp: 98.2 F (36.8 C) 98.1 F (36.7 C) 97.8 F (36.6 C) 98.6 F (37 C)  TempSrc: Oral Oral Oral Oral  SpO2: 96% 94% 97% 98%  Weight:  75.9 kg    Height:        Intake/Output Summary (Last 24 hours) at 05/28/2018 1816 Last data filed at 05/28/2018 1352 Gross per 24 hour  Intake 292.5 ml  Output 0 ml  Net 292.5 ml    PULM  Normal effort , no use of accessory muscles CV  No JVD, RRR Abd      No distended, nontender VASC  follows commands right arm remains weak but this is his baseline left arm and both legs are moving appropriately  Laboratory CBC    Component Value Date/Time   WBC 5.8 05/26/2018 1118   HGB 13.0 05/26/2018 1118   HGB 13.6 03/23/2014 2024   HCT 38.0 (L) 05/26/2018 1118   HCT 40.9 03/23/2014 2024   PLT 147 (L) 05/26/2018 1118   PLT 182 03/23/2014 2024    BMET    Component Value Date/Time   NA 139 05/26/2018 1118   NA 140 03/23/2014 2024   K 4.2 05/26/2018 1118   K 3.8 03/23/2014 2024   CL 106 05/26/2018 1118   CL 104 03/23/2014 2024   CO2 26 05/26/2018 1118   CO2 27 03/23/2014 2024   GLUCOSE 151 (H) 05/26/2018 1118   GLUCOSE 156 04/16/2015 1220   BUN 5 (L) 05/26/2018 1118   BUN 13 03/23/2014 2024   CREATININE 0.69 05/26/2018 1118   CREATININE 0.97 03/23/2014 2024   CALCIUM 8.3 (L) 05/26/2018 1118   CALCIUM 8.7 03/23/2014 2024   GFRNONAA >60 05/26/2018 1118   GFRNONAA >60 03/23/2014 2024   GFRAA >60 05/26/2018 1118   GFRAA >60 03/23/2014 2024    Assessment/Planning: POD #6 s/p right carotid stent placement x2 for tandem lesions   Continue dual antiplatelet therapy.  I will move forward with  inpatient rehab services.  Hopefully we will hear from [place]  05/28/2018, 6:16 PM

## 2018-05-28 NOTE — Progress Notes (Signed)
Occupational Therapy Treatment Patient Details Name: Kyle Richardson MRN: [id number] DOB: September 21, 1928 Today's Date: 05/28/2018    History of present illness Pt is an 84 yo male s/p R Carotid stent placement. The carotid stenosis was identified in July when he was brought to the emergency room with right arm weakness and right eye visual changes.  At that time it was identified that he had had a 48mm infarct to R temporal lobe, subacute/chronic infarcts to L PCA, L BG and L thalamus. pt was admitted to CIR at the time and made great improvement nearing baseline functioning. Since procedure pt has been very confused and agitiated. Pt neuro deficits are returning to levels pre CIR.    OT comments  Pt seen for OT tx this date. Pt seated in recliner with pillows supporting optimal trunk alignment with pt able to maintain upright posture t/o with pt occasionally re-adjusting with and without cues to correct posture. Pt participated in self feeding, grooming, and tabletop IADL tasks requiring assist from OT to support RUE in task initiation with decreasing level of assist near task completion (please see details below). Pt continues to progress towards therapy goals. CIR remains most appropriate discharge recommendation based on pt's progress thus far, ability to participate in therapy, excellent family supports at home, and difference between pt's current status and previous baseline prior to admission.    Follow Up Recommendations  CIR    Equipment Recommendations  Other (comment)(TBD)    Recommendations for Other Services      Precautions / Restrictions Precautions Precautions: Fall Restrictions Weight Bearing Restrictions: No Other Position/Activity Restrictions: R sided weakness.        Mobility Bed Mobility     General bed mobility comments: deferred, pt up in recliner  Transfers    Balance Overall balance assessment: Needs assistance Sitting-balance support: Feet supported Sitting  balance-Leahy Scale: Fair Sitting balance - Comments: with pillows to support positioning, pt noted to self correct slight R lateral lean without cues to perform Postural control: Right lateral lean                       ADL either performed or assessed with clinical judgement   ADL Overall ADL's : Needs assistance/impaired Eating/Feeding: Sitting;Minimal assistance;Cueing for sequencing Eating/Feeding Details (indicate cue type and reason): Aide initially feeding pt soft cooked apples with a spoon with max assist at start of session. Caregiver education/training provided in grading self feeding activity to increase pt's participation with verbal instruction and visual demo, resulting in therapist providing verbal cues and min assist-mod for R hand to hold/support bowl while pt scooped spoonful of apples using L hand and brought to mouth. Pt initiated bringing bowl up to mouth with RUE to prevent spillage, with min assist to support RUE in task.   Grooming: Sitting Grooming Details (indicate cue type and reason): While sitting with pillows to support optimal upright positioning pt washed his hands with set up and verbal cues to attend to R side with initial hand over hand decreasing to CGA to complete washing his hands with a washcloth. Pt then independently initiated bringing washcloth up to wipe his mouth using his R hand and requiring no assist to perform. Pt combed his hair using RUE with initial max assist to support RUE and hold comb, decreasing to mod assist  Vision Baseline Vision/History: Wears glasses(RHH from previous CVA) Wears Glasses: At all times Patient Visual Report: No change from baseline Additional Comments: [facility] from previous CVA   Perception     Praxis      Cognition Arousal/Alertness: Awake/alert Behavior During Therapy: WFL for tasks assessed/performed Overall Cognitive Status: Impaired/Different from  baseline Area of Impairment: Orientation;Attention;Awareness;Problem solving;Following commands                 Orientation Level: Person;Situation Current Attention Level: Sustained   Following Commands: Follows one step commands with increased time     Problem Solving: Slow processing;Requires tactile cues;Requires verbal cues;Difficulty sequencing;Decreased initiation          Exercises Other Exercises Other Exercises: Pt participated in seated table top IADL tasks involving reaching, crossing midline and incorporated LUE and RUE, requiring verbal cues and initial mod assist to incorporate RUE and initiate into task, decreasing to [place] assist to complete    Shoulder Instructions       General Comments     Pertinent Vitals/ Pain       Pain Assessment: No/denies pain  Home Living                                          Prior Functioning/Environment              Frequency  Min 3X/week        Progress Toward Goals  OT Goals(current goals can now be found in the care plan section)  Progress towards OT goals: Progressing toward goals  Acute Rehab OT Goals Patient Stated Goal: to return to PLOF OT Goal Formulation: With patient/family Time For Goal Achievement: 06/07/18 Potential to Achieve Goals: Good  Plan Discharge plan remains appropriate;Frequency remains appropriate    Co-evaluation                 AM-PAC PT "6 Clicks" Daily Activity     Outcome Measure   Help from another person eating meals?: A Little Help from another person taking care of personal grooming?: A Little Help from another person toileting, which includes using toliet, bedpan, or urinal?: A Lot Help from another person bathing (including washing, rinsing, drying)?: A Lot Help from another person to put on and taking off regular upper body clothing?: A Lot Help from another person to put on and taking off regular lower body clothing?: A Lot 6 Click  Score: 14    End of Session    OT Visit Diagnosis: Other abnormalities of gait and mobility (R26.89);Hemiplegia and hemiparesis;Low vision, both eyes (H54.2);Other symptoms and signs involving cognitive function Hemiplegia - Right/Left: Right Hemiplegia - dominant/non-dominant: Dominant Hemiplegia - caused by: Cerebral infarction   Activity Tolerance Patient tolerated treatment well   Patient Left in chair;with call bell/phone within reach;with chair alarm set;with family/visitor present   Nurse Communication          Time: 0321-2248 OT Time Calculation (min): 24 min  Charges: OT General Charges $OT Visit: 1 Visit OT Treatments $Self Care/Home Management : 23-37 mins   Jeni Salles, MPH, MS, OTR/L ascom [phone] 05/28/18, 2:35 PM

## 2018-05-28 NOTE — Progress Notes (Signed)
Inpatient Rehabilitation  Chart reviewed note functional improvements with OT and PT yesterday and anticipate that patient would be able to actively participate in our program.  Also note Dr. Nino Parsley recommendation for moving forward with IP Rehab services and I have placed a consult order.  I called and left voicemail for nurse case manager, Nann to discuss case.  I have also called patient's spouse and daughter.  Spouse was in agreement with plan to proceed with insurance authorization and stated that since last time IP Rehab was denied she also wants to discuss back-up options with her daughter and the team.  Plan to continue to follow for timing of medical readiness, insurance decision, and IP Rehab bed availability.  Call if questions.    Carmelia Roller., CCC/SLP Admission Coordinator  [place]  Cell [phone]

## 2018-05-28 NOTE — Care Management (Signed)
Auth has been initiated by Aurelio Brash for acute inpatient rehab

## 2018-05-28 NOTE — Progress Notes (Signed)
PHARMACIST - PHYSICIAN COMMUNICATION  DR:   Delana Meyer  CONCERNING: IV to Oral Route Change Policy  RECOMMENDATION: This patient is receiving valproic acid by the intravenous route.  Based on criteria approved by the Pharmacy and Therapeutics Committee, the intravenous medication(s) is/are being converted to the equivalent oral dose form(s).   DESCRIPTION: These criteria include:  The patient is eating (either orally or via tube) and/or has been taking other orally administered medications for a least 24 hours  The patient has no evidence of active gastrointestinal bleeding or impaired GI absorption (gastrectomy, short bowel, patient on TNA or NPO).  If you have questions about this conversion, please contact the Pharmacy Department  []   [phone] )  [facility] [x]   [phone] )  [facility] []   [phone] )  [facility] []   [phone] )  [facility] []   [phone] )  [place], [facility] 05/28/2018 12:37 PM

## 2018-05-28 NOTE — Care Management (Signed)
Received message from Aurelio Brash from [place]. Per her note this day, it appears that patient meets criteria for acute inpatient rehab.   Will require insurance auth.  Spoke with wife and if patient meets criteria for acute inpatient rehab, she would like to pursue.  If not, back up plan would be skilled nursing and facility preference would be [facility]. Left this information in a voicemail for [employer]

## 2018-05-29 ENCOUNTER — Encounter: Payer: Self-pay | Admitting: Neurology

## 2018-05-29 NOTE — Progress Notes (Signed)
Inpatient Rehabilitation  I await decision from [employer] for decision about IP Rehab.  Plan to follow up with team when I have a decision.  Per rehab MD patient would need to arrive before 5pm on day of admission.  Call if questions.    Carmelia Roller., CCC/SLP Admission Coordinator  [place]  Cell [phone]

## 2018-05-29 NOTE — Progress Notes (Signed)
Subjective: Patient remains confused, noted to have difficulty with comprehension and processing information. He is sitting at the side of the bed with PT.  Objective: Current vital signs: BP (!) 116/58 (BP Location: Left Arm)   Pulse 61   Temp (!) 97.4 F (36.3 C) (Oral)   Resp 18   Ht 5\' 9"  (1.753 m)   Wt 75.9 kg   SpO2 93%   BMI 24.71 kg/m  Vital signs in last 24 hours: Temp:  [97.4 F (36.3 C)-98.6 F (37 C)] 97.4 F (36.3 C) (08/21 0732) Pulse Rate:  [60-64] 61 (08/21 0732) Resp:  [18] 18 (08/21 0337) BP: (116-158)/(58-74) 116/58 (08/21 0732) SpO2:  [93 %-98 %] 93 % (08/21 0732)  Intake/Output from previous day: 08/20 0701 - 08/21 0700 In: 240 [P.O.:240] Out: 0  Intake/Output this shift: No intake/output data recorded. Nutritional status:  Diet Order            DIET DYS 3 Room service appropriate? Yes; Fluid consistency: Thin  Diet effective now             General Exam  Patient looks appropriate of age, well built, nourished and appropriately groomed.   Cardiovascular Exam: S1, S2 heart sounds present   Carotid exam revealed no bruit   Lung exam was clear to auscultation   Neurological Exam  Alert, but disoriented x4  Attention span and concentration seemed inappropriate   Language seemed impaired (naming, spontaneous speech, comprehension)   I. Olfactory not examined  II: Visual fields were full. Pupils were equal, round and reactive to light and accommodation  III,IV, VI: ptosis not present, extra-ocular motions intact bilaterally  V,VII: smile symmetric, facial light touch sensation normal bilaterally  VIII: UTA  IX, X: UTA  XI: UTA  XII: UTA  Tone is normal in all extremities, no abnormal movements seen   Muscle strength in all extremities seemed normal.   Deep tendon reflexes were symmetric   Sensations not tested, had difficulty participating in this part of exam  Gait and station not tested, currently working with PT    Lab Results: Basic Metabolic Panel: Recent Labs  Lab 05/23/18 0214 05/26/18 1118  NA 140 139  K 4.2 4.2  CL 106 106  CO2 28 26  GLUCOSE 147* 151*  BUN 11 5*  CREATININE 0.82 0.69  CALCIUM 8.5* 8.3*    Liver Function Tests: Recent Labs  Lab 05/26/18 1118  AST 26  ALT 19  ALKPHOS 46  BILITOT 0.9  PROT 5.6*  ALBUMIN 2.6*   No results for input(s): LIPASE, AMYLASE in the last 168 hours. No results for input(s): AMMONIA in the last 168 hours.  CBC: Recent Labs  Lab 05/23/18 0214 05/26/18 1118  WBC 7.5 5.8  NEUTROABS  --  3.3  HGB 13.4 13.0  HCT 39.1* 38.0*  MCV 95.9 95.9  PLT 145* 147*    Cardiac Enzymes: No results for input(s): CKTOTAL, CKMB, CKMBINDEX, TROPONINI in the last 168 hours.  Lipid Panel: No results for input(s): CHOL, TRIG, HDL, CHOLHDL, VLDL, LDLCALC in the last 168 hours.  CBG: Recent Labs  Lab 05/22/18 1957 05/22/18 2357 05/23/18 0402 05/23/18 0758 05/25/18 1136  GLUCAP 134* 119* 133* 101* 107*    Microbiology: Results for orders placed or performed during the hospital encounter of 05/22/18  MRSA PCR Screening     Status: None   Collection Time: 05/22/18 12:30 PM  Result Value Ref Range Status   MRSA by PCR NEGATIVE NEGATIVE Final  Comment:        The GeneXpert MRSA Assay (FDA approved for NASAL specimens only), is one component of a comprehensive MRSA colonization surveillance program. It is not intended to diagnose MRSA infection nor to guide or monitor treatment for MRSA infections. Performed at [facility], [address] [place], [place] [address]   CULTURE, BLOOD (ROUTINE X 2) w Reflex to ID Panel     Status: None (Preliminary result)   Collection Time: 05/26/18 11:18 AM  Result Value Ref Range Status   Specimen Description BLOOD RT [facility]  Final   Special Requests   Final    BOTTLES DRAWN AEROBIC AND ANAEROBIC Blood Culture adequate volume   Culture   Final    NO GROWTH 3 DAYS Performed at [facility], [address], [place], [place] [address]    Report Status PENDING  Incomplete  CULTURE, BLOOD (ROUTINE X 2) w Reflex to ID Panel     Status: None (Preliminary result)   Collection Time: 05/26/18 11:33 AM  Result Value Ref Range Status   Specimen Description BLOOD RT HAND  Final   Special Requests   Final    BOTTLES DRAWN AEROBIC AND ANAEROBIC Blood Culture adequate volume   Culture   Final    NO GROWTH 3 DAYS Performed at [facility], [address], [place], [place] [address]    Report Status PENDING  Incomplete  Urine Culture     Status: None   Collection Time: 05/26/18  1:55 PM  Result Value Ref Range Status   Specimen Description   Final    URINE, RANDOM Performed at [facility], [address], [place], [place] [address]    Special Requests   Final    NONE Performed at [facility], [address] [place], [place] [address]    Culture   Final    NO GROWTH Performed at [facility], [place] [address] [place], [place] [address]    Report Status 05/27/2018 FINAL  Final    Coagulation Studies: No results for input(s): LABPROT, INR in the last 72 hours.  Imaging: No results found.  Medications:  I have reviewed the patient's current medications. Scheduled: . apixaban  5 mg Oral BID  . aspirin  162 mg Oral Daily  . atorvastatin  40 mg Oral q1800  . clopidogrel  75 mg Oral Daily  . divalproex  250 mg Oral Q12H  . docusate sodium  100 mg Oral Daily  . famotidine  20 mg Oral BID  . FLUoxetine  10 mg Oral Daily  . fluticasone  2 spray Each Nare Daily  . latanoprost  1 drop Both Eyes QHS  . loratadine  10 mg Oral Daily  . metoprolol succinate  12.5 mg Oral Daily  . sodium chloride flush  3 mL Intravenous Q12H    Assessment: 84 year old male with prior left temporal and left PCA infarct now 7 days s/p right carotid stent placement. Patient with history of worsening cognition from last hospitalization with right  side weakness and visual changes. Now with intermittent cognitive changes likely due to underlying vascular dementia. Can not rule out recurrent infarcts as well since patient has a history of strokes on anticoagulation. Head CT shows no acute changes. Will require intensive rehab.  Plan 1. Will hold off MRI of the brain due to recent stent placement. Per Dr. Delana Meyer, prefer to hold off imaging due to recent stents unless warranted. 2.  Continuemedical management with dual therapy Aspiri and Plavix 75 mg /day with intensive management of vascular risk factor to keep systolic BP (SBP) <114 mm Hg (130 mm Hg if diabetic) and low density lipoprotein (LDL) <70 mg/dl, and lifestyle modification.Continuehigh potency statin. 3. Patient to remain on anticoagulation for afib 4. Recommend to avoid benzos due to possible paradoxical worsening of neuropsychiatric symptoms. 5. Continue PT/OT/speech  This patient was staffed with Dr. Irish Elders, Alease Frame who personally evaluated patient, reviewed documentation and agreed with assessment and plan of care as above.  Rufina Falco, DNP, FNP-BC Board certified Nurse Practitioner Neurology Department    LOS: 7 days   05/29/2018  12:26 PM

## 2018-05-29 NOTE — Progress Notes (Signed)
Physical Therapy Treatment Patient Details Name: Kyle Richardson MRN: [id number] DOB: [birth year] Today's Date: 05/29/2018    History of Present Illness Pt is an 84 yo male s/p R Carotid stent placement. The carotid stenosis was identified in July when he was brought to the emergency room with right arm weakness and right eye visual changes.  At that time it was identified that he had had a 78mm infarct to R temporal lobe, subacute/chronic infarcts to L PCA, L BG and L thalamus. pt was admitted to CIR at the time and made great improvement nearing baseline functioning. Since procedure pt has been very confused and agitiated. Pt neuro deficits are returning to levels pre CIR.     PT Comments    Pt was lethargic and restless on PT arrival. Pt family reported pt did not get any sleep night prior. Pt hearing aids are not staying in ear effectively and consistently giving pt feedback. This restricted pt's ability to hear and understand PT today, and lead to more frustration/anxiety than on previous days. Pt was still able to participate in therapy. Pt moved supine to sit min assist 2+, and was able to demonstrating sitting balance similarly to day prior. Pt needs slightly less posterior support in sitting, but still needs VC's and postural cues to find midline. Pt performed sitting reaching activities, and showed slightly more confusion/frusration with activity in part because of hearing difficulties related to hearing aids.  Sit to stand mod assist 2+ looked similar to day prior, and pt was more aware of RLE/ RUE, and its positioning during activity. Due to increased frustration with activity, ambulation was defferred, and was returned to bed. Pt was rolled on to L side to encourage elongation through L side body and more aligned position of pelvis. Will continue to benefit from skilled PT to address above deficits and promote optimal return to PLOF. Continue to recommend transition to acute inpatient rehab upon  discharge for high-intensity, post-acute rehab services.   Follow Up Recommendations  CIR     Equipment Recommendations  Rolling walker with 5" wheels    Recommendations for Other Services       Precautions / Restrictions Precautions Precautions: Fall Restrictions Weight Bearing Restrictions: No Other Position/Activity Restrictions: R sided weakness.     Mobility  Bed Mobility Overal bed mobility: Needs Assistance Bed Mobility: Supine to Sit     Supine to sit: Min assist;+2 for physical assistance        Transfers Overall transfer level: Needs assistance Equipment used: None Transfers: Sit to/from Stand Sit to Stand: Mod assist;+2 physical assistance         General transfer comment: Pt requires cues for ant weight shift at trunk; blocking at L knee for LLE support, and tactile cue at pelvis for hip extension and midline positioning of trunk  Ambulation/Gait             General Gait Details: Unable to ambualte today due to pt fatigue and anxiety   Stairs             Wheelchair Mobility    Modified Rankin (Stroke Patients Only)       Balance Overall balance assessment: Needs assistance Sitting-balance support: Feet supported Sitting balance-Leahy Scale: Fair Sitting balance - Comments: Pt needs VC's and PC's to correct posture to midline. Pt needs less post trunk support today Postural control: Right lateral lean;Posterior lean Standing balance support: Bilateral upper extremity supported Standing balance-Leahy Scale: Poor Standing balance comment: Pt needs mod  assist +2 consistently in standing. Blocking at L knee and posterior pelvis support               High Level Balance Comments: Pt requires mod assist to shift weight, in particular to L side. Pt able to find stnaidnbalance w/o post support with BLE blocked and BUE supported.             Cognition Arousal/Alertness: Awake/alert Behavior During Therapy:  Restless;Anxious Overall Cognitive Status: Impaired/Different from baseline Area of Impairment: Orientation;Attention;Problem solving;Following commands;Awareness                 Orientation Level: Person;Situation Current Attention Level: Selective   Following Commands: Follows one step commands inconsistently;Follows multi-step commands inconsistently Safety/Judgement: (Improved awareness of deficits since last session)   Problem Solving: Slow processing;Requires tactile cues;Requires verbal cues;Difficulty sequencing;Decreased initiation        Exercises Other Exercises Other Exercises: Supine to/from sit min assist; Sit to stand; mod assist +2 x3; Rolling in bed to L side mod assist 2+ Other Exercises: sitting balance and reaching activities: Forward reaching on tray table with BIlat hands--facilitating active trunk control, pt struggled today with activity 2/2 confusion and hearing impiarments. Pt showed actvie trunk control but dififculty understanding PT directions and purpose    General Comments        Pertinent Vitals/Pain Pain Assessment: No/denies pain    Home Living                      Prior Function            PT Goals (current goals can now be found in the care plan section)      Frequency    7X/week      PT Plan Current plan remains appropriate    Co-evaluation              AM-PAC PT "6 Clicks" Daily Activity  Outcome Measure  Difficulty turning over in bed (including adjusting bedclothes, sheets and blankets)?: Unable Difficulty moving from lying on back to sitting on the side of the bed? : Unable Difficulty sitting down on and standing up from a chair with arms (e.g., wheelchair, bedside commode, etc,.)?: Unable Help needed moving to and from a bed to chair (including a wheelchair)?: A Lot Help needed walking in hospital room?: A Lot Help needed climbing 3-5 steps with a railing? : A Lot 6 Click Score: 9    End of  Session Equipment Utilized During Treatment: Gait belt Activity Tolerance: Patient limited by lethargy;Patient limited by fatigue Patient left: with family/visitor present;with call bell/phone within reach;in bed;with bed alarm set   PT Visit Diagnosis: Unsteadiness on feet (R26.81);Muscle weakness (generalized) (M62.81);Hemiplegia and hemiparesis;Pain Hemiplegia - Right/Left: Right Hemiplegia - dominant/non-dominant: Dominant Hemiplegia - caused by: Cerebral infarction Pain - Right/Left: Right Pain - part of body: Shoulder     Time: 1610-9604 PT Time Calculation (min) (ACUTE ONLY): 38 min  Charges:                        Hortencia Conradi, SPT 05/29/18,1:11 PM

## 2018-05-29 NOTE — Progress Notes (Signed)
[facility]  Daily Progress Note   Subjective  - 7 Days Post-Op  Patient resting comfortably, he is still confused but clearly much improved over last week.  Objective Vitals:   05/29/18 0337 05/29/18 0732 05/29/18 1254 05/29/18 1633  BP: (!) 144/74 (!) 116/58  128/63  Pulse: 64 61  67  Resp: 18     Temp: 97.7 F (36.5 C) (!) 97.4 F (36.3 C)  98.4 F (36.9 C)  TempSrc: Oral Oral  Oral  SpO2: 94% 93%  93%  Weight:   79.4 kg   Height:   5\' 8"  (1.727 m)    No intake or output data in the 24 hours ending 05/29/18 1734  PULM  Normal effort , no use of accessory muscles CV  No JVD, RRR Abd      No distended, nontender VASC  follows commands right arm remains weak but this is his baseline left arm and both legs are moving appropriately  Laboratory CBC    Component Value Date/Time   WBC 5.8 05/26/2018 1118   HGB 13.0 05/26/2018 1118   HGB 13.6 03/23/2014 2024   HCT 38.0 (L) 05/26/2018 1118   HCT 40.9 03/23/2014 2024   PLT 147 (L) 05/26/2018 1118   PLT 182 03/23/2014 2024    BMET    Component Value Date/Time   NA 139 05/26/2018 1118   NA 140 03/23/2014 2024   K 4.2 05/26/2018 1118   K 3.8 03/23/2014 2024   CL 106 05/26/2018 1118   CL 104 03/23/2014 2024   CO2 26 05/26/2018 1118   CO2 27 03/23/2014 2024   GLUCOSE 151 (H) 05/26/2018 1118   GLUCOSE 156 04/16/2015 1220   BUN 5 (L) 05/26/2018 1118   BUN 13 03/23/2014 2024   CREATININE 0.69 05/26/2018 1118   CREATININE 0.97 03/23/2014 2024   CALCIUM 8.3 (L) 05/26/2018 1118   CALCIUM 8.7 03/23/2014 2024   GFRNONAA >60 05/26/2018 1118   GFRNONAA >60 03/23/2014 2024   GFRAA >60 05/26/2018 1118   GFRAA >60 03/23/2014 2024    Assessment/Planning: POD #7 s/p right carotid stent placement x2 for tandem lesions   Continue dual antiplatelet therapy.  I will move forward with inpatient rehab services.   I check with DC planning still have not heard from  [place]  05/29/2018, 5:34 PM

## 2018-05-29 NOTE — Care Management Important Message (Signed)
Copy of signed IM left with patient and sitter in room.

## 2018-05-30 MED ORDER — ASPIRIN EC 81 MG PO TBEC
81.0000 mg | DELAYED_RELEASE_TABLET | Freq: Every day | ORAL | Status: DC
  Administered 2018-05-31: 81 mg via ORAL
  Filled 2018-05-30: qty 1

## 2018-05-30 NOTE — Clinical Social Work Note (Signed)
Patient's insurance denied CIR. CSW spoke with patient's daughter [name] [phone] and she would like patient to go to [facility]. CSW notified Magda Paganini, admissions coordinator at [facility] of bed acceptance. CSW also contacted [employer] to begin authorization for SNF. CSW will continue to follow for discharge planning.   [place], [place]

## 2018-05-30 NOTE — Progress Notes (Signed)
Physical Therapy Treatment Patient Details Name: Kyle Richardson MRN: [id number] DOB: [birth year] Today's Date: 05/30/2018    History of Present Illness Pt is an 84 yo male s/p R Carotid stent placement. The carotid stenosis was identified in July when he was brought to the emergency room with right arm weakness and right eye visual changes.  At that time it was identified that he had had a 88mm infarct to R temporal lobe, subacute/chronic infarcts to L PCA, L BG and L thalamus. pt was admitted to CIR at the time and made great improvement nearing baseline functioning. Since procedure pt has been very confused and agitiated. Pt neuro deficits are returning to levels pre CIR.     PT Comments    Pt is progressing towards goals today but is still very limited in functional mobility. Pt was communicating more and his words were a little more clear than yesterdays session. Pt was able to preform bed mobility with Mod A, but needed Max A x2 to get back into bed 2/2 fatigue. Pt transfers sit<>stand with Mod A x2. Ambulation was deferred because 1 step with Max A x2 was still unsafe for pt. See below for mobility details. Pt cognition was still very limited, but he was able to follow simple commands. Pt benefits from simple verbal cues and tactile cues with all movement. Pt has heavy posterior/right side lean that does improve with multimodal cueing. Pt pushes back and to right with increased tactile cue, and does better with reaching task to shift weight to L. Pt is limited by fatigue, weakness, poor motor planning, posterior/right side lean, and diminished use of R side. Current recommendation to CIR remains appropraite 2/2 to deficits and pt ability to participate in PT session  Follow Up Recommendations  CIR     Equipment Recommendations  Rolling walker with 5" wheels    Recommendations for Other Services       Precautions / Restrictions Precautions Precautions: Fall Precaution Comments:  Cognitive-linguisitc deficits  Restrictions Weight Bearing Restrictions: No Other Position/Activity Restrictions: history of right sided weakness after CVA    Mobility  Bed Mobility Overal bed mobility: Needs Assistance Bed Mobility: Supine to Sit;Sit to Supine Rolling: Min assist   Supine to sit: Mod assist Sit to supine: Max assist;+2 for physical assistance   General bed mobility comments: Pt is apprehensive to move to EOB, helps with BLE movment slightly. Unable to contribute with trunk control.  Pt demonstartes poor motor planning  Transfers Overall transfer level: Needs assistance Equipment used: 2 person hand held assist Transfers: Sit to/from Stand Sit to Stand: Mod assist;+2 physical assistance         General transfer comment: Pt requires cues for ant weight shift at trunk; blocking at L knee for LLE support, and tactile cue at pelvis for hip extension and midline positioning of trunk. Pt locks ou L LE in standing and needs physiocal cue to limit hyperextension  Ambulation/Gait Ambulation/Gait assistance: Max assist;+2 physical assistance   Assistive device: None;2 person hand held assist       General Gait Details: Unable to ambualte today due to pt fatigue and anxiety   Stairs             Wheelchair Mobility    Modified Rankin (Stroke Patients Only)       Balance Overall balance assessment: Needs assistance Sitting-balance support: Feet supported Sitting balance-Leahy Scale: Fair Sitting balance - Comments: Pt needs VC's and PC's to correct posture to midline. Pt needs  less post trunk support today Postural control: Right lateral lean;Posterior lean Standing balance support: Bilateral upper extremity supported Standing balance-Leahy Scale: Poor Standing balance comment: Pt needs mod assist +2 consistently in standing. Blocking at L knee and posterior pelvis support                            Cognition Arousal/Alertness:  Lethargic Behavior During Therapy: Flat affect;Restless Overall Cognitive Status: Impaired/Different from baseline Area of Impairment: Orientation;Attention;Problem solving;Following commands;Awareness                 Orientation Level: Person;Situation Current Attention Level: Selective   Following Commands: Follows one step commands inconsistently     Problem Solving: Slow processing;Requires tactile cues;Requires verbal cues;Difficulty sequencing;Decreased initiation        Exercises Other Exercises Other Exercises: Supine to sit mod assist 2+; sitting balance and reaching activities; rolling in bed L/R with CGA-Min A    General Comments        Pertinent Vitals/Pain Pain Assessment: No/denies pain Faces Pain Scale: Hurts little more Pain Location: R shoulder Pain Descriptors / Indicators: Grimacing Pain Intervention(s): Limited activity within patient's tolerance;Monitored during session;Repositioned    Home Living                      Prior Function            PT Goals (current goals can now be found in the care plan section) Acute Rehab PT Goals Patient Stated Goal: to return to PLOF PT Goal Formulation: With patient/family Progress towards PT goals: Progressing toward goals    Frequency    7X/week      PT Plan Current plan remains appropriate    Co-evaluation              AM-PAC PT "6 Clicks" Daily Activity  Outcome Measure  Difficulty turning over in bed (including adjusting bedclothes, sheets and blankets)?: Unable Difficulty moving from lying on back to sitting on the side of the bed? : Unable Difficulty sitting down on and standing up from a chair with arms (e.g., wheelchair, bedside commode, etc,.)?: Unable Help needed moving to and from a bed to chair (including a wheelchair)?: A Lot Help needed walking in hospital room?: Total Help needed climbing 3-5 steps with a railing? : Total 6 Click Score: 7    End of Session  Equipment Utilized During Treatment: Gait belt Activity Tolerance: Patient limited by lethargy;Patient limited by fatigue Patient left: in bed;with call bell/phone within reach;with bed alarm set;with nursing/sitter in room Nurse Communication: Mobility status PT Visit Diagnosis: Unsteadiness on feet (R26.81);Muscle weakness (generalized) (M62.81);Hemiplegia and hemiparesis;Pain Hemiplegia - Right/Left: Right Hemiplegia - dominant/non-dominant: Dominant Hemiplegia - caused by: Cerebral infarction Pain - Right/Left: Right Pain - part of body: Shoulder     Time: 1700-1749 PT Time Calculation (min) (ACUTE ONLY): 27 min  Charges:                        Rosario Adie, SPT    Rosario Adie 05/30/2018, 4:38 PM

## 2018-05-30 NOTE — Progress Notes (Signed)
Occupational Therapy Treatment Patient Details Name: Kyle Richardson MRN: [id number] DOB: 29-Sep-1928 Today's Date: 05/30/2018    History of present illness Pt is an 84 yo male s/p R Carotid stent placement. The carotid stenosis was identified in July when he was brought to the emergency room with right arm weakness and right eye visual changes.  At that time it was identified that he had had a 42mm infarct to R temporal lobe, subacute/chronic infarcts to L PCA, L BG and L thalamus. pt was admitted to CIR at the time and made great improvement nearing baseline functioning. Since procedure pt has been very confused and agitiated. Pt neuro deficits are returning to levels pre CIR.    OT comments  Pt seen after talking to NSG about pt being lethargic and sleeping all day today.  He aroused with OT and was able to participate after needing cues at beginning of session.  He was very focused on needing to urinate in urinal and assisted with placement but pt not able to initiate use of urinal and took about 15 minutes to urinate 200 mls.  Attempted to call NSG about total output but she did not answer and family asked to let [place] know when they came to check on him and left on counter by sink. Worked on hand to face with cold, wet washcloth using R hand and UE with assist to move washcloth over face.  Also worked on R sided visual stimulation and exercise for RUE and hand to reach over to L shoulder, up to face and back down in extension.  He had difficulty with this task as far as maintaining focus on task.  Per chart, pt has been denied for CIR and plans are to go to [facility].   Follow Up Recommendations  CIR    Equipment Recommendations       Recommendations for Other Services Rehab consult    Precautions / Restrictions Precautions Precautions: Fall Precaution Comments: Cognitive-linguisitc deficits  Restrictions Weight Bearing Restrictions: No Other Position/Activity Restrictions: history of  right sided weakness after CVA       Mobility Bed Mobility Overal bed mobility: (P) Needs Assistance Bed Mobility: (P) Supine to Sit;Sit to Supine Rolling: (P) Mod assist            Transfers                      Balance                                           ADL either performed or assessed with clinical judgement   ADL Overall ADL's : Needs assistance/impaired   Eating/Feeding Details (indicate cue type and reason): Worked on hand to mouth and face using a cold wet wash cloth to help increase and maintain alertness.  NSG indicated he has been sleepy all day and was yesterday as well.  He required hand over hand to initiate using R hand for elbow flexion and to move washcloth around face.                                      General ADL Comments: Pt able to use urinal with max assist to place and hold in place with cues to urinate and pt kept repeating "are  we ready, are we ready?" he urinated 200 ccs and left on counter near sink since NSG did not pick up Ascom phone to report total output and color which was dark yellow/brown.  Attempted to work on [employer] but pt had difficulty attending to task to follow simple commands.       Vision Baseline Vision/History: Wears glasses Wears Glasses: At all times Patient Visual Report: No change from baseline Additional Comments: [facility] from previous CVA   Perception     Praxis      Cognition Arousal/Alertness: Lethargic Behavior During Therapy: Flat affect;Restless Overall Cognitive Status: Impaired/Different from baseline Area of Impairment: Orientation;Attention;Problem solving;Following commands;Awareness                 Orientation Level: Person;Situation Current Attention Level: Selective   Following Commands: Follows one step commands inconsistently     Problem Solving: Slow processing;Requires tactile cues;Requires verbal cues;Difficulty sequencing;Decreased  initiation          Exercises     Shoulder Instructions       General Comments      Pertinent Vitals/ Pain       Pain Assessment: No/denies pain Faces Pain Scale: (P) Hurts little more Pain Location: (P) R shoulder Pain Descriptors / Indicators: (P) Grimacing Pain Intervention(s): (P) Limited activity within patient's tolerance;Monitored during session;Repositioned  Home Living                                          Prior Functioning/Environment              Frequency  Min 3X/week        Progress Toward Goals  OT Goals(current goals can now be found in the care plan section)  Progress towards OT goals: Progressing toward goals  Acute Rehab OT Goals Patient Stated Goal: to return to PLOF OT Goal Formulation: With patient/family Time For Goal Achievement: 06/07/18 Potential to Achieve Goals: Good  Plan Discharge plan remains appropriate;Frequency remains appropriate    Co-evaluation                 AM-PAC PT "6 Clicks" Daily Activity     Outcome Measure   Help from another person eating meals?: A Little Help from another person taking care of personal grooming?: A Lot Help from another person toileting, which includes using toliet, bedpan, or urinal?: A Lot Help from another person bathing (including washing, rinsing, drying)?: A Lot Help from another person to put on and taking off regular upper body clothing?: A Lot Help from another person to put on and taking off regular lower body clothing?: A Lot 6 Click Score: 13    End of Session    OT Visit Diagnosis: Other abnormalities of gait and mobility (R26.89);Hemiplegia and hemiparesis;Low vision, both eyes (H54.2);Other symptoms and signs involving cognitive function Hemiplegia - Right/Left: Right Hemiplegia - dominant/non-dominant: Dominant Hemiplegia - caused by: Cerebral infarction   Activity Tolerance Patient limited by lethargy   Patient Left in bed;with call  bell/phone within reach;with family/visitor present;with bed alarm set   Nurse Communication (attempted calling NSG to report urine output but did not answer--instructed family to let her know about urine in urinal)        Time: 1500-1545 OT Time Calculation (min): 45 min  Charges: OT General Charges $OT Visit: 1 Visit OT Treatments $Self Care/Home Management : 23-37 mins $Neuromuscular Re-education:  8-22 mins  Chrys Racer, OTR/L ascom [phone] 05/30/18, 4:26 PM

## 2018-05-30 NOTE — Progress Notes (Signed)
[facility]  Daily Progress Note   Subjective  - 8 Days Post-Op  Patient was up all night agitated and is now sleeping  Objective Vitals:   05/30/18 0303 05/30/18 0714 05/30/18 1100 05/30/18 1536  BP: (!) 169/73 133/70  (!) 147/61  Pulse: 62 60  61  Resp: 18  18 18   Temp: 98.2 F (36.8 C) (!) 97.5 F (36.4 C)    TempSrc:  Oral    SpO2: 97% 96%  96%  Weight:      Height:        Intake/Output Summary (Last 24 hours) at 05/30/2018 1618 Last data filed at 05/30/2018 1406 Gross per 24 hour  Intake 250 ml  Output 0 ml  Net 250 ml    PULM  Normal effort , no use of accessory muscles CV  No JVD, RRR Abd      No distended, nontender VASC   Somnolent  Laboratory CBC    Component Value Date/Time   WBC 5.8 05/26/2018 1118   HGB 13.0 05/26/2018 1118   HGB 13.6 03/23/2014 2024   HCT 38.0 (L) 05/26/2018 1118   HCT 40.9 03/23/2014 2024   PLT 147 (L) 05/26/2018 1118   PLT 182 03/23/2014 2024    BMET    Component Value Date/Time   NA 139 05/26/2018 1118   NA 140 03/23/2014 2024   K 4.2 05/26/2018 1118   K 3.8 03/23/2014 2024   CL 106 05/26/2018 1118   CL 104 03/23/2014 2024   CO2 26 05/26/2018 1118   CO2 27 03/23/2014 2024   GLUCOSE 151 (H) 05/26/2018 1118   GLUCOSE 156 04/16/2015 1220   BUN 5 (L) 05/26/2018 1118   BUN 13 03/23/2014 2024   CREATININE 0.69 05/26/2018 1118   CREATININE 0.97 03/23/2014 2024   CALCIUM 8.3 (L) 05/26/2018 1118   CALCIUM 8.7 03/23/2014 2024   GFRNONAA >60 05/26/2018 1118   GFRNONAA >60 03/23/2014 2024   GFRAA >60 05/26/2018 1118   GFRAA >60 03/23/2014 2024    Assessment/Planning: POD #8 s/p right carotid stent placement x2 for tandem lesions  Continue dual antiplatelet therapy. [facility] has rejected him so we will move forward with [facility] hope for DC tomorrow   Hortencia Pilar  05/30/2018, 4:18 PM

## 2018-05-30 NOTE — Progress Notes (Signed)
Inpatient Rehabilitation  Received a preliminary denial for IP Rehab from [employer] with offer for peer to peer.  Reviewed case with Dr. Letta Pate as he is familiar with this patient from previous CIR stay, he states no grounds for an appeal.  Insurance denial will be upheld.  I have updated insurance, patient's spouse, and lead case manager/Angela.  Will sign off at this time.  Call if questions.   Carmelia Roller., CCC/SLP Admission Coordinator  [place]  Cell [phone]

## 2018-05-30 NOTE — Care Management (Signed)
Per Lenna Sciara with CIR insurance denied inpatient rehab. RNCM has notified care team.

## 2018-05-31 DIAGNOSIS — H409 Unspecified glaucoma: Secondary | ICD-10-CM | POA: Diagnosis not present

## 2018-05-31 DIAGNOSIS — R4182 Altered mental status, unspecified: Secondary | ICD-10-CM | POA: Diagnosis not present

## 2018-05-31 DIAGNOSIS — I10 Essential (primary) hypertension: Secondary | ICD-10-CM | POA: Diagnosis not present

## 2018-05-31 DIAGNOSIS — M6281 Muscle weakness (generalized): Secondary | ICD-10-CM | POA: Diagnosis not present

## 2018-05-31 DIAGNOSIS — R41841 Cognitive communication deficit: Secondary | ICD-10-CM | POA: Diagnosis not present

## 2018-05-31 DIAGNOSIS — I252 Old myocardial infarction: Secondary | ICD-10-CM | POA: Diagnosis not present

## 2018-05-31 DIAGNOSIS — Z9861 Coronary angioplasty status: Secondary | ICD-10-CM | POA: Diagnosis not present

## 2018-05-31 DIAGNOSIS — M5136 Other intervertebral disc degeneration, lumbar region: Secondary | ICD-10-CM | POA: Diagnosis not present

## 2018-05-31 DIAGNOSIS — I69351 Hemiplegia and hemiparesis following cerebral infarction affecting right dominant side: Secondary | ICD-10-CM | POA: Diagnosis not present

## 2018-05-31 DIAGNOSIS — I779 Disorder of arteries and arterioles, unspecified: Secondary | ICD-10-CM | POA: Diagnosis not present

## 2018-05-31 DIAGNOSIS — E119 Type 2 diabetes mellitus without complications: Secondary | ICD-10-CM | POA: Diagnosis not present

## 2018-05-31 DIAGNOSIS — I69359 Hemiplegia and hemiparesis following cerebral infarction affecting unspecified side: Secondary | ICD-10-CM | POA: Diagnosis not present

## 2018-05-31 DIAGNOSIS — G43909 Migraine, unspecified, not intractable, without status migrainosus: Secondary | ICD-10-CM | POA: Diagnosis not present

## 2018-05-31 DIAGNOSIS — M79651 Pain in right thigh: Secondary | ICD-10-CM | POA: Diagnosis not present

## 2018-05-31 DIAGNOSIS — M1990 Unspecified osteoarthritis, unspecified site: Secondary | ICD-10-CM | POA: Diagnosis not present

## 2018-05-31 DIAGNOSIS — G4730 Sleep apnea, unspecified: Secondary | ICD-10-CM | POA: Diagnosis not present

## 2018-05-31 DIAGNOSIS — I69822 Dysarthria following other cerebrovascular disease: Secondary | ICD-10-CM | POA: Diagnosis not present

## 2018-05-31 DIAGNOSIS — I480 Paroxysmal atrial fibrillation: Secondary | ICD-10-CM | POA: Diagnosis not present

## 2018-05-31 DIAGNOSIS — I69391 Dysphagia following cerebral infarction: Secondary | ICD-10-CM | POA: Diagnosis not present

## 2018-05-31 DIAGNOSIS — R1311 Dysphagia, oral phase: Secondary | ICD-10-CM | POA: Diagnosis not present

## 2018-05-31 DIAGNOSIS — I69322 Dysarthria following cerebral infarction: Secondary | ICD-10-CM | POA: Diagnosis not present

## 2018-05-31 DIAGNOSIS — E7800 Pure hypercholesterolemia, unspecified: Secondary | ICD-10-CM | POA: Diagnosis not present

## 2018-05-31 DIAGNOSIS — I2510 Atherosclerotic heart disease of native coronary artery without angina pectoris: Secondary | ICD-10-CM | POA: Diagnosis not present

## 2018-05-31 DIAGNOSIS — Z7401 Bed confinement status: Secondary | ICD-10-CM | POA: Diagnosis not present

## 2018-05-31 LAB — CULTURE, BLOOD (ROUTINE X 2)
Culture: NO GROWTH
Culture: NO GROWTH
SPECIAL REQUESTS: ADEQUATE
Special Requests: ADEQUATE

## 2018-05-31 MED ORDER — CLOPIDOGREL BISULFATE 75 MG PO TABS: 75.0000 mg | ORAL_TABLET | Freq: Every day | ORAL | 2 refills | Status: DC

## 2018-05-31 MED ORDER — HALOPERIDOL LACTATE 5 MG/ML IJ SOLN
1.0000 mg | Freq: Once | INTRAMUSCULAR | Status: AC
  Administered 2018-05-31: 1 mg via INTRAVENOUS
  Filled 2018-05-31: qty 1

## 2018-05-31 MED ORDER — ASPIRIN 81 MG PO TBEC: 81.0000 mg | DELAYED_RELEASE_TABLET | Freq: Every day | ORAL | 2 refills | Status: AC

## 2018-05-31 MED ORDER — TRAMADOL HCL 50 MG PO TABS: 50.0000 mg | ORAL_TABLET | Freq: Four times a day (QID) | ORAL | 0 refills | Status: DC | PRN

## 2018-05-31 NOTE — Care Management Important Message (Signed)
Important Message  Patient Details  Name: Lawsen Arnott MRN: [id number] Date of Birth: 1928-03-16   Medicare Important Message Given:  Yes    [name] A Holger Sokolowski 05/31/2018, 11:42 AM

## 2018-05-31 NOTE — Plan of Care (Signed)
  Problem: Safety: Goal: Ability to remain free from injury will improve Outcome: Progressing   Problem: Skin Integrity: Goal: Risk for impaired skin integrity will decrease Outcome: Progressing   

## 2018-05-31 NOTE — Discharge Summary (Signed)
[facility]    Discharge Summary    Patient ID:  Kyle Richardson MRN: [id number] DOB/AGE: [birth year] 84 y.o.  Admit date: 05/22/2018 Discharge date: 05/31/2018 Date of Surgery: 05/22/2018 Surgeon: Surgeon(s): Schnier, Dolores Lory, MD Algernon Huxley, MD  Admission Diagnosis: Carotid stenosis, symptomatic, with infarction [facility]) [I63.239]  Discharge Diagnoses:  Carotid stenosis, symptomatic, with infarction [facility]) [I63.239]  Secondary Diagnoses: Past Medical History:  Diagnosis Date  . Abdominal fibromatosis   . 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  s/p PTCA of the mid RCA 1/96, also  s/p  CABG x 4 7/96  . Complication of anesthesia    confusion and hallucinations for several days after receiving Versed  . Degenerative disc disease   . Diabetes mellitus ([place])   . Glaucoma   . Hypercholesterolemia   . Hypertension   . Migraine headache    history of  . Myocardial infarction ([place]) 1996  . Osteoarthritis   . Sleep apnea   . Stroke ([place]) 02/22/2017   affected left side     Procedure(s): CAROTID PTA/STENT INTERVENTION  Discharged Condition: stable  HPI:  Patient was brought to [facility] 05/22/2018 for elective angioplasty and stent placement of tandem lesions with right internal carotid as well as common carotid arteries.  The patient initially presented to the hospital a few weeks prior to this intervention with increased confusion slurred speech.  He already has weakness of the right side from a stroke that he sustained in April of this year.  Stenting was performed without incident.  And post procedure he was at his baseline neurologically.  Hemodynamically he did exhibit mild bradycardia with heart rate in the 50s and borderline hypotension systolic pressures in the 90s.  He also vomited several times and was able able to keep his antiplatelet medications down and therefore I elected to initiate  a heparin drip.  CCU team was asked to participate in the intensive care unit.  Neurology consultation was obtained on postprocedure day #2 given his confusion which was expected to clear somewhat but really was essentially his baseline.  CT scan was obtained which did not demonstrate any acute findings no intracranial hemorrhage.  Orthopedics was also asked to evaluate on post procedure day #3 secondary to right shoulder pain but this was also a chronic condition and no further treatment is being undertaken at this time.  On postoperative day #7 he had begun a pattern of becoming very agitated and staying up all night in spite of having a sitter he received significant sedation and now was noted to be sleeping all day.  2 days ago his sedation was stopped and today he is once again conversant but somewhat confused his dysarthria is at baseline.  His left side is 5 out of 5 motor strength in his right side again is at baseline.  He has been evaluated for patient rehab at [facility] was turned down and will therefore be transferred to skilled nursing.   Hospital Course:  Kyle Richardson is a 84 y.o. male is S/P Right Procedure(s): CAROTID PTA/STENT INTERVENTION Extubated: POD # 0 Physical exam: Confused but baseline Post-op wounds no hematoma right groin Pt. Ambulating, voiding and taking PO diet with minimal difficulty. Pt pain controlled with PO pain meds. Labs as below Complications:none  Consults:  Treatment Team:  Catarina Hartshorn, MD Alexis Goodell, MD [place], MD  Significant Diagnostic Studies: CBC Lab Results  Component Value  Date   WBC 5.8 05/26/2018   HGB 13.0 05/26/2018   HCT 38.0 (L) 05/26/2018   MCV 95.9 05/26/2018   PLT 147 (L) 05/26/2018    BMET    Component Value Date/Time   NA 139 05/26/2018 1118   NA 140 03/23/2014 2024   K 4.2 05/26/2018 1118   K 3.8 03/23/2014 2024   CL 106 05/26/2018 1118   CL 104 03/23/2014 2024   CO2 26 05/26/2018 1118   CO2  27 03/23/2014 2024   GLUCOSE 151 (H) 05/26/2018 1118   GLUCOSE 156 04/16/2015 1220   BUN 5 (L) 05/26/2018 1118   BUN 13 03/23/2014 2024   CREATININE 0.69 05/26/2018 1118   CREATININE 0.97 03/23/2014 2024   CALCIUM 8.3 (L) 05/26/2018 1118   CALCIUM 8.7 03/23/2014 2024   GFRNONAA >60 05/26/2018 1118   GFRNONAA >60 03/23/2014 2024   GFRAA >60 05/26/2018 1118   GFRAA >60 03/23/2014 2024   COAG Lab Results  Component Value Date   INR 1.00 04/15/2018   INR 1.12 02/22/2018   INR 1.4 02/17/2014     Disposition:  Discharge to :Skilled nursing facility Discharge Instructions    Call MD for:  redness, tenderness, or signs of infection (pain, swelling, bleeding, redness, odor or green/yellow discharge around incision site)   Complete by:  As directed    Call MD for:  severe or increased pain, loss or decreased feeling  in affected limb(s)   Complete by:  As directed    Call MD for:  temperature >100.5   Complete by:  As directed    Discharge instructions   Complete by:  As directed    We will continue Eliquis 5 mg bid plus EC ASA 81 mg po daily plus Plavix 75 mg po daily for three months then the Plavix will be DC'd   No dressing needed   Complete by:  As directed    Replace only if drainage present   Resume previous diet   Complete by:  As directed      Allergies as of 05/31/2018      Reactions   Clarithromycin Diarrhea   Codeine Other (See Comments)   Unknown   Flomax [tamsulosin Hcl] Other (See Comments)   Unknown   Lamisil [terbinafine] Other (See Comments)   Unknown   Levofloxacin Other (See Comments)   Diplopia. NEVER put patient on levoquin!   Sertraline Other (See Comments)   Unknown   Sulfa Antibiotics Other (See Comments)   Unknown   Versed [midazolam] Other (See Comments)   Hallucinations and confusion      Medication List    TAKE these medications   acetaminophen 500 MG tablet Commonly known as:  TYLENOL Take 1 tablet (500 mg total) by mouth every 6  (six) hours as needed. What changed:  reasons to take this   apixaban 5 MG Tabs tablet Commonly known as:  ELIQUIS Take 1 tablet (5 mg total) by mouth 2 (two) times daily.   aspirin 81 MG EC tablet Take 1 tablet (81 mg total) by mouth daily. Start taking on:  06/01/2018   atorvastatin 40 MG tablet Commonly known as:  LIPITOR Take 1 tablet (40 mg total) by mouth daily. What changed:  when to take this   clopidogrel 75 MG tablet Commonly known as:  PLAVIX Take 1 tablet (75 mg total) by mouth daily. Start taking on:  06/01/2018   divalproex 500 MG 24 hr tablet Commonly known as:  DEPAKOTE ER Take  1 tablet (500 mg total) by mouth daily. What changed:  when to take this   FLUoxetine 10 MG capsule Commonly known as:  PROZAC Take 1 capsule (10 mg total) by mouth daily.   fluticasone 50 MCG/ACT nasal spray Commonly known as:  FLONASE SPRAY 2 SPRAYS IN EACH NOSTRIL ONCE A DAY What changed:  See the new instructions.   latanoprost 0.005 % ophthalmic solution Commonly known as:  XALATAN Place 1 drop into both eyes at bedtime.   loratadine 10 MG tablet Commonly known as:  CLARITIN Take 10 mg by mouth daily.   metoprolol succinate 25 MG 24 hr tablet Commonly known as:  TOPROL-XL Take 0.5 tablets (12.5 mg total) by mouth daily.   nitroGLYCERIN 0.4 MG SL tablet Commonly known as:  NITROSTAT Place 0.4 mg under the tongue every 5 (five) minutes as needed for chest pain.   PRESERVISION AREDS 2 PO Take 1 capsule by mouth 2 (two) times daily.   traMADol 50 MG tablet Commonly known as:  ULTRAM Take 1 tablet (50 mg total) by mouth every 6 (six) hours as needed for moderate pain.      Verbal and written Discharge instructions given to the patient. Wound care per Discharge AVS Contact information for after-discharge care    [facility] .   Service:  Skilled Nursing Contact information: [place] [phone]              Signed: Hortencia Pilar, MD  05/31/2018, 1:58 PM

## 2018-05-31 NOTE — Plan of Care (Signed)

## 2018-05-31 NOTE — Progress Notes (Addendum)
Pt having some anxiety and wanting to get up from the bed. Nurse tried to ri-direct pt but wont cooperate. Notify [facility]. Will continue to monitor.  Update 0056: Doctor Jannifer Franklin ordered Haldol 1 mg IV once. Will continue to monitor

## 2018-05-31 NOTE — Clinical Social Work Note (Signed)
Patient is medically ready for discharge to [facility] today. CSW notified patient's wife Diane Mochizuki [phone] of discharge. CSW also notified Magda Paganini, admissions coordinator at [facility]. Patient will be transported by EMS and RN will call report. Wife has requested that RN call her when EMS is here to pick up patient. CSW notified RN of this request.   Annamaria Boots MSW, [place]

## 2018-05-31 NOTE — Clinical Social Work Note (Signed)
CSW received a phone call from [facility], and they have approved patient going to SNF for rehab. Auth number is [id number]. Health Team will also call [place] to relay information.   Kyle Richardson, Kyle Richardson

## 2018-06-04 DIAGNOSIS — I69322 Dysarthria following cerebral infarction: Secondary | ICD-10-CM | POA: Diagnosis not present

## 2018-06-04 DIAGNOSIS — I69359 Hemiplegia and hemiparesis following cerebral infarction affecting unspecified side: Secondary | ICD-10-CM | POA: Diagnosis not present

## 2018-06-04 DIAGNOSIS — I480 Paroxysmal atrial fibrillation: Secondary | ICD-10-CM | POA: Diagnosis not present

## 2018-06-04 DIAGNOSIS — I779 Disorder of arteries and arterioles, unspecified: Secondary | ICD-10-CM | POA: Diagnosis not present

## 2018-06-05 ENCOUNTER — Other Ambulatory Visit: Payer: Self-pay | Admitting: *Deleted

## 2018-06-05 NOTE — Patient Outreach (Signed)
[place] [facility]) Care Management  06/05/2018  Kyle Richardson 09/13/1928 [id number]   Post Acute care coordination phone call to patient's spouse on [facility] to follow up on patient's progress in rehab. Per patient's spouse, he is doing well and is participating actively in PT, OT and speech. Per patient's spouse she is anxious for him to return home and verbalizes no community resource or barriers to discharge at this time. There is no discharge date set, however patient is expected to remain in the facility for the next 2 weeks.  Plan: This social worker will follow up with patient on 06/06/18 as well as discharge planner to address care needs and recommendations for discharge at [facility] and rehab.   Sheralyn Boatman [facility] Care Management [phone]

## 2018-06-06 ENCOUNTER — Other Ambulatory Visit: Payer: Self-pay

## 2018-06-06 ENCOUNTER — Other Ambulatory Visit: Payer: Self-pay | Admitting: *Deleted

## 2018-06-06 NOTE — Patient Outreach (Signed)
[place] [facility]) Care Management  06/06/2018  Fahad Cisse Jul 08, 1928 [id number]   Post Acute Care consult to see patient at [facility] following an inpatient stay at [facility].  Patient's spouse at bedside as well as his in home aid and family friend. Mutltiple disciplines presenting to the room. Patient's spouse verbalized feeling overwhelmed and requested that this social worker contact their daughter for information regrding in home care. Per patient's spouse they continue to have 12 hour in home care however through a private agency.  Met with discharge planner who agrees to provide patient's daughter with a list of agnecies that will provide in home care using their long term care insurance.   Sheralyn Boatman [facility] Care Management [phone]

## 2018-06-06 NOTE — Patient Outreach (Addendum)
[place] [facility]) Care Management  06/06/2018  Coben Godshall 02/19/1928 [id number]  Case Conference: Multidisciplinary Case Discussion on the above patient completed today. RN CM will collaborate with CSW to discuss patient placement vs utilization of Long Term Care insurance for home care after SNF D/C. CSW will continue to follow patient during SNF placement.   Plan: Multidisciplinary Case Discussion update in 2 weeks. Follow up with patient in interim if patient is discharged to home.  Blossie Raffel E. Rollene Rotunda RN, BSN [facility] Care Management Coordinator [phone]

## 2018-06-07 ENCOUNTER — Other Ambulatory Visit: Payer: Self-pay | Admitting: *Deleted

## 2018-06-07 NOTE — Patient Outreach (Signed)
[place] [facility]) Care Management  06/07/2018  Kyle Richardson December 15, 1927 [id number]  Post Acute Care Coordination call: Phone call to patient's daughter Kyle Richardson to confirm in home care needs post discharge from rehab. Per patient's daughter they are paying a private duty aid to sit with patient 12 hours per day while in the facility. Once discharged they havie hired [facility] who will provide in home care. Patient's daughter confirms that they will be able to utilize patient's long term care insurance for this service. Per patient's daughter, she and her husband will be coming to visit this weekend to assist with patient's care.   Plan: This social worker to continue to follow patient's progress while in rehab and will assist with discharge planning.   Sheralyn Boatman [facility] Care Management [phone]

## 2018-06-16 DIAGNOSIS — I10 Essential (primary) hypertension: Secondary | ICD-10-CM | POA: Diagnosis not present

## 2018-06-16 DIAGNOSIS — G7000 Myasthenia gravis without (acute) exacerbation: Secondary | ICD-10-CM | POA: Diagnosis not present

## 2018-06-16 DIAGNOSIS — R1311 Dysphagia, oral phase: Secondary | ICD-10-CM | POA: Diagnosis not present

## 2018-06-16 DIAGNOSIS — H5461 Unqualified visual loss, right eye, normal vision left eye: Secondary | ICD-10-CM | POA: Diagnosis not present

## 2018-06-16 DIAGNOSIS — M5136 Other intervertebral disc degeneration, lumbar region: Secondary | ICD-10-CM | POA: Diagnosis not present

## 2018-06-16 DIAGNOSIS — I480 Paroxysmal atrial fibrillation: Secondary | ICD-10-CM | POA: Diagnosis not present

## 2018-06-16 DIAGNOSIS — G43909 Migraine, unspecified, not intractable, without status migrainosus: Secondary | ICD-10-CM | POA: Diagnosis not present

## 2018-06-16 DIAGNOSIS — I69322 Dysarthria following cerebral infarction: Secondary | ICD-10-CM | POA: Diagnosis not present

## 2018-06-16 DIAGNOSIS — H532 Diplopia: Secondary | ICD-10-CM | POA: Diagnosis not present

## 2018-06-16 DIAGNOSIS — I69391 Dysphagia following cerebral infarction: Secondary | ICD-10-CM | POA: Diagnosis not present

## 2018-06-16 DIAGNOSIS — I252 Old myocardial infarction: Secondary | ICD-10-CM | POA: Diagnosis not present

## 2018-06-16 DIAGNOSIS — E7800 Pure hypercholesterolemia, unspecified: Secondary | ICD-10-CM | POA: Diagnosis not present

## 2018-06-16 DIAGNOSIS — I69320 Aphasia following cerebral infarction: Secondary | ICD-10-CM | POA: Diagnosis not present

## 2018-06-16 DIAGNOSIS — I69351 Hemiplegia and hemiparesis following cerebral infarction affecting right dominant side: Secondary | ICD-10-CM | POA: Diagnosis not present

## 2018-06-16 DIAGNOSIS — H409 Unspecified glaucoma: Secondary | ICD-10-CM | POA: Diagnosis not present

## 2018-06-16 DIAGNOSIS — G4733 Obstructive sleep apnea (adult) (pediatric): Secondary | ICD-10-CM | POA: Diagnosis not present

## 2018-06-16 DIAGNOSIS — I69398 Other sequelae of cerebral infarction: Secondary | ICD-10-CM | POA: Diagnosis not present

## 2018-06-16 DIAGNOSIS — I6523 Occlusion and stenosis of bilateral carotid arteries: Secondary | ICD-10-CM | POA: Diagnosis not present

## 2018-06-16 DIAGNOSIS — E119 Type 2 diabetes mellitus without complications: Secondary | ICD-10-CM | POA: Diagnosis not present

## 2018-06-16 DIAGNOSIS — Z951 Presence of aortocoronary bypass graft: Secondary | ICD-10-CM | POA: Diagnosis not present

## 2018-06-16 DIAGNOSIS — Z7902 Long term (current) use of antithrombotics/antiplatelets: Secondary | ICD-10-CM | POA: Diagnosis not present

## 2018-06-16 DIAGNOSIS — I2510 Atherosclerotic heart disease of native coronary artery without angina pectoris: Secondary | ICD-10-CM | POA: Diagnosis not present

## 2018-06-16 DIAGNOSIS — Z7901 Long term (current) use of anticoagulants: Secondary | ICD-10-CM | POA: Diagnosis not present

## 2018-06-17 ENCOUNTER — Telehealth: Payer: Self-pay | Admitting: Pulmonary Disease

## 2018-06-17 NOTE — Telephone Encounter (Signed)
Copied from [place] [id number]. Topic: Quick Communication - See Telephone Encounter >> Jun 17, 2018  9:06 AM Mylinda Latina, NT wrote: CRM for notification. See Telephone encounter for: 06/17/18. Misty calling from [facility] states that the patient was just discharge from [facility]. She needs verbals for a plan of Nursing care for him, 1x a week for 2 weeks.  Please call with those verbals CB# [phone]

## 2018-06-17 NOTE — Telephone Encounter (Signed)
Verbal orders given to [facility] at [facility] for Nursing care at home.

## 2018-06-18 ENCOUNTER — Telehealth: Payer: Self-pay | Admitting: Internal Medicine

## 2018-06-18 NOTE — Telephone Encounter (Signed)
Copied from [place] [id number]. Topic: Quick Communication - See Telephone Encounter >> Jun 18, 2018  4:44 PM Berneta Levins wrote: CRM for notification. See Telephone encounter for: 06/18/18.  Kyle Richardson from [place] calling for verbal orders: PT 2 weeks 4 [name] can be reached at [phone], OK to leave a message.

## 2018-06-19 ENCOUNTER — Telehealth: Payer: Self-pay | Admitting: Internal Medicine

## 2018-06-19 ENCOUNTER — Other Ambulatory Visit: Payer: Self-pay

## 2018-06-19 ENCOUNTER — Telehealth: Payer: Self-pay | Admitting: Pulmonary Disease

## 2018-06-19 NOTE — Patient Outreach (Addendum)
[facility]) Care Management  06/19/2018  Kyle Richardson Jan 16, 1928 [id number]   Transition of Care Week # 1 Susscssful initial Transition of Care outreach encounter to Kyle Richardson, the above patients wife and durable POA. Patient is an established patient with [place] services and known to this RN CM. RN CM received notification today that patient was discharged from [facility] on Saturday, 06/15/18.   Wife states patient is doing well and glad to be home. Patient is able to speak however he has periods of confusion. Wife states she is currently using private pay CNA services for 8am-9pm care however when daughter, Kyle Richardson returns from vacation this weekend, they are planning to sit down to determine which agency to use for long-term care. Wife states long-term care insurance "kicked in" while patient was at [facility]. Wife continues to care for Special Needs adult son who receives CAP services 1 day per week.   Medications: --Wife states patient has all medications and takes as prescribed per discharge list from SNF. Patient does not have any swallowing difficulty from recent stroke and able to take medications whole with non-thinned liquids. Patient unable to manage own medications so wife manages and administers. RN CM concerned that SNF discharge list does not include ASA as indicated on discharge summary from recent hospitalization. RN CM placed call to PCP office for clarification and office will call wife back to clarify. RN CM also requested prescription for loratadine per wife request.  Follow-up Appointments: --Appointments reviewed in EMR. Wife confirms soonest available appointment with Dr. Nicki Reaper is October 2. RN CM requested sooner appointment however October 2 appointment is soonest available.  Safety/Mobility/Falls: --Wife denies new or recent falls. [facility] PT and nursing services are re-established in home and have made initial visits/assessments. PT  will be working with patient 2 x week and RN 1 x week for 2 weeks. --Assistive devices used by patient is walker --General fall risks and prevention discussed with wife during this encounter.  Social/Community Resources Needed: --Wife currently denies needs for the following community resources: transportation, meals, medication assistance, housing. Kyle Richardson, [facility] CSW currently involved with patients care.  Advanced Directives: --Wife states patient currently has Advanced Directives in place however she is unable to locate original copy. RN CM able to locate dual POA document in Epic. Wife request new Advanced Directives documents. Basic Advanced Directives education discussed with patient and packet will be delivered and reviewed during home visit as agreed upon with patient during this encounter.  Plan: Home visit scheduled for next week  [facility] CM Care Plan Problem One     Most Recent Value  Care Plan Problem One  High Risk for readmission related to ongoing complications of stroke/stroke related symptoms  Role Documenting the Problem One  Care Management [place] for Problem One  Active  THN Long Term Goal   patient will not have readmission to hospital within the next 31 days  THN Long Term Goal Start Date  06/19/18  Interventions for Problem One Long Term Goal  discussed recent planned hospitalization with unexpected complications, discussed fall safety and HH PT services, confirmed private pay CNA help is active in home 7 days week from 8am-9pm, discussed importance for all follow-up appointments  THN CM Short Term Goal #1   patient will remain free from falls over the next 30 days  THN CM Short Term Goal #1 Start Date  06/19/18  Interventions for Short Term Goal #1  confirmed PT  envolvement and safty equiptment in home, verified CNA/sitter availability, discussed basic safty precautions related to falls  THN CM Short Term Goal #2   patient will take all medications as  prescribed over the next 30 days  THN CM Short Term Goal #2 Start Date  06/19/18  Interventions for Short Term Goal #2  medication reconciliation completed, collaborated with PCP to determine medication discrepency related to ASA      Kyle Richardson E. Rollene Rotunda RN, BSN [facility] Care Management Coordinator [phone]

## 2018-06-19 NOTE — Telephone Encounter (Signed)
Disregard last message sent.

## 2018-06-19 NOTE — Telephone Encounter (Signed)
[facility] nurse calling back

## 2018-06-19 NOTE — Telephone Encounter (Signed)
Copied from [place] [id number]. Topic: Quick Communication - See Telephone Encounter >> Jun 19, 2018  2:01 PM Rutherford Nail, [place] wrote: CRM for notification. See Telephone encounter for: 06/19/18. Porcia, RN with [facility] calling and states that the the patient's wife would like to know if the patient needs to be on Asprin? States that when the patient was discharged from the hospital to [facility], he was on aspirin, but once he was discharged from [facility] the aspirin was not on the med list. Please call the wife to let her know if the patient needs to be taking the aspirin. States that the patient's wife would also like a prescription for Loratadine because it is cheaper with insurance than OTC. Kary Kos does have a follow up home visit on Tuesday 06/25/18. CB# [phone]

## 2018-06-19 NOTE — Telephone Encounter (Signed)
06/19/18 2:17pm  Portia called back stating she was accidentally disconnected. She is requesting appt prior to the one scheduled 07/10/18. See below for other concerns with aspirin. Ok to contact the pts wife, [name], with recommendations/appt & to notify Portia with [facility].   Copied from [place] [id number]. Topic: Quick Communication - See Telephone Encounter >> Jun 19, 2018  2:01 PM Rutherford Nail, [place] wrote: CRM for notification. See Telephone encounter for: 06/19/18. Porcia, RN with [facility] calling and states that the the patient's wife would like to know if the patient needs to be on Asprin? States that when the patient was discharged from the hospital to [facility], he was on aspirin, but once he was discharged from [facility] the aspirin was not on the med list. Please call the wife to let her know if the patient needs to be taking the aspirin. States that the patient's wife would also like a prescription for Loratadine because it is cheaper with insurance than OTC. Kary Kos does have a follow up home visit on Tuesday 06/25/18. CB# [phone]

## 2018-06-19 NOTE — Telephone Encounter (Signed)
Error - see updated telephone note to Dr. Nicki Reaper.

## 2018-06-19 NOTE — Telephone Encounter (Signed)
Verbal orders given to [place] at [facility] for PT.

## 2018-06-20 ENCOUNTER — Other Ambulatory Visit: Payer: Self-pay

## 2018-06-20 NOTE — Patient Outreach (Signed)
[facility]) Care Management  06/20/2018  Isaack Preble Mar 02, 1928 [id number]  Multidisciplinary Case Discussion on the above patient completed today. No additional interventions or plans suggested related to patients medical or social needs. RN CM will continue to collaborate with CSW and follow patient on a weekly basis until transition of care from SNF to home is completed, then monthly and as needed basis until discharge.  Eavan Gonterman E. Rollene Rotunda RN, BSN [facility] Care Management Coordinator [phone]

## 2018-06-20 NOTE — Telephone Encounter (Signed)
Per discharge summary 05/2018 - was supposed to be on aspirin.  (need to confirm nothing happened in skilled nursing that they would have stopped.

## 2018-06-20 NOTE — Telephone Encounter (Signed)
Should patient take aspirin daily?

## 2018-06-21 NOTE — Telephone Encounter (Signed)
Left message for wife to call back.

## 2018-06-24 ENCOUNTER — Telehealth: Payer: Self-pay | Admitting: Pulmonary Disease

## 2018-06-24 NOTE — Telephone Encounter (Signed)
Called and gave verbal orders for speech therapy  

## 2018-06-24 NOTE — Telephone Encounter (Signed)
Copied from [place] [phone]. Topic: Quick Communication - See Telephone Encounter >> Jun 24, 2018  1:55 PM Gardiner Ramus wrote: CRM for notification. See Telephone encounter for: 06/24/18. Katie calling from [facility] calling for verbal orders for speech therapy 2x4. Please advise [phone]

## 2018-06-25 ENCOUNTER — Telehealth: Payer: Self-pay | Admitting: *Deleted

## 2018-06-25 ENCOUNTER — Other Ambulatory Visit: Payer: Self-pay

## 2018-06-25 NOTE — Telephone Encounter (Signed)
Copied from [place] [phone]. Topic: General - Other >> Jun 25, 2018 12:37 PM Yvette Rack wrote: Reason for CRM: pt wife [name] calling back stating that someone called her about her husband taking a aspirin please call pt wife [name] back at [phone]

## 2018-06-25 NOTE — Telephone Encounter (Signed)
Spoke with patients wife and physical therapy. Physical therapy thinks that he has had a decline and wife agrees but says he is a little better today. Therapist was out there this morning and she still noticed the right sided weakness and some increased confusion. She said that the patient was aware that something is not right, he kept telling her he didn't know what was wrong. He was having some trouble following commands. Advised patients wife that he should be evaluated today instead of trying to have his appt with you moved up but patients wife declined and said she has no way to take him to the hospital

## 2018-06-25 NOTE — Patient Outreach (Signed)
[facility]) Care Management   06/25/2018  Kyle Richardson May 31, 1928 [id number]  Kyle Richardson is an 84 y.o. male  Subjective: Patient states he is glad to be back home. Wife states "we had a bad night last night. We were both up and down a lot". Wife and PT state patient was increasingly confused yesterday with increased right sided weakness however per wife that has somewhat resolved now. PT states she has notified PCP this morning regarding her assessment and requested a sooner post SNF follow-up appointment for patient.  CNA states patient is "about the same". States she walks with him twice daily, outside if it is not too hot. States patient is able to walk x 1 assist with walker.   Objective:   BP 138/70 (BP Location: Left Arm, Patient Position: Sitting, Cuff Size: Normal)   Pulse 62   Resp 19   SpO2 95%   Physical Exam  Constitutional: He appears well-developed and well-nourished.  Cardiovascular: Normal rate, regular rhythm, normal heart sounds and intact distal pulses.  Respiratory: Effort normal and breath sounds normal. No respiratory distress.  GI: Soft. Bowel sounds are normal.  Per CNA, patient had soft, brown stool this am. C/O upper abdominal soreness prior to BM. Patient denied discomforts when questioned by RN CM  Musculoskeletal: He exhibits no edema.  Small round quarter size firm hematoma noted on patients right shin. Wife and PT state hematoma was not present yesterday during PT visit and unsure of origine.  Neurological: He is alert. Coordination abnormal.  Oriented to name, place, year. RN CM did not observe any obvious confusion during visit. Able to communicate needs including request for assist toileting. Unable to verify medical history, medications, or medical appointments. Patient able to follow commands. Poor coordination on R side. Able to squeeze hands on command. L grip>R. Patient able to lift both arms on command L higher than R. Patient  states decreased sensation to bottom of R foot.  Skin: Skin is warm and dry.  Psychiatric: He has a normal mood and affect. His behavior is normal. Judgment and thought content normal.   Per wife and caregiver within patients normal limits s/p stroke    Encounter Medications:   Outpatient Encounter Medications as of 06/25/2018  Medication Sig Note  . acetaminophen (TYLENOL) 500 MG tablet Take 1 tablet (500 mg total) by mouth every 6 (six) hours as needed. (Patient taking differently: Take 500 mg by mouth every 6 (six) hours as needed for moderate pain or headache. )   . apixaban (ELIQUIS) 5 MG TABS tablet Take 1 tablet (5 mg total) by mouth 2 (two) times daily.   Marland Kitchen aspirin EC 81 MG EC tablet Take 1 tablet (81 mg total) by mouth daily.   Marland Kitchen atorvastatin (LIPITOR) 40 MG tablet Take 1 tablet (40 mg total) by mouth daily. (Patient taking differently: Take 40 mg by mouth at bedtime. )   . clopidogrel (PLAVIX) 75 MG tablet Take 1 tablet (75 mg total) by mouth daily.   . divalproex (DEPAKOTE ER) 500 MG 24 hr tablet Take 1 tablet (500 mg total) by mouth daily. (Patient taking differently: Take 500 mg by mouth at bedtime. )   . FLUoxetine (PROZAC) 10 MG capsule Take 1 capsule (10 mg total) by mouth daily.   . fluticasone (FLONASE) 50 MCG/ACT nasal spray SPRAY 2 SPRAYS IN EACH NOSTRIL ONCE A DAY (Patient taking differently: SPRAY 1 SPRAY IN EACH NOSTRIL ONCE A DAY AT BEDTIME)   . latanoprost (  XALATAN) 0.005 % ophthalmic solution Place 1 drop into both eyes at bedtime.   Marland Kitchen loratadine (CLARITIN) 10 MG tablet Take 10 mg by mouth daily.   . metoprolol succinate (TOPROL-XL) 25 MG 24 hr tablet Take 0.5 tablets (12.5 mg total) by mouth daily.   . Multiple Vitamins-Minerals (PRESERVISION AREDS 2 PO) Take 1 capsule by mouth 2 (two) times daily.   . nitroGLYCERIN (NITROSTAT) 0.4 MG SL tablet Place 0.4 mg under the tongue every 5 (five) minutes as needed for chest pain.  06/25/2018: Available if needed  . traMADol  (ULTRAM) 50 MG tablet Take 1 tablet (50 mg total) by mouth every 6 (six) hours as needed for moderate pain. (Patient not taking: Reported on 06/19/2018)    No facility-administered encounter medications on file as of 06/25/2018.     Functional Status:   In your present state of health, do you have any difficulty performing the following activities: 06/25/2018 05/22/2018  Hearing? [place]? Y -  Difficulty concentrating or making decisions? Y -  Walking or climbing stairs? Y -  Dressing or bathing? Y -  Doing errands, shopping? Kyle Richardson  Preparing Food and eating ? Y -  Using the Toilet? Y -  In the past six months, have you accidently leaked urine? Y -  Do you have problems with loss of bowel control? Y -  Managing your Medications? Y -  Managing your Finances? Y -  Housekeeping or managing your Housekeeping? Y -  Some recent data might be hidden    Fall/Depression Screening:    Fall Risk  06/25/2018 04/24/2018 08/21/2017  Falls in the past year? No No No  Injury with Fall? - No -  Risk for fall due to : Impaired balance/gait;Impaired mobility Impaired balance/gait;Impaired mobility;Impaired vision;Other (Comment) -  Risk for fall due to: Comment - right sided weakness from recent strokes -   PHQ 2/9 Scores 06/25/2018 04/24/2018 08/21/2017 08/21/2016 08/20/2015 12/17/2014 02/03/2013  PHQ - 2 Score 0 0 0 0 0 0 0  PHQ- 9 Score - - 0 - - - -    Assessment:  Patient observed sitting up in recliner upon RN CM arrival. Wife, CNA caregiver, PT all present. Patient engaged in conversation and participating when questioned. Able to answer questions correctly and RN CM did not notice any obvious confusion. Has difficulty with speech intermittently. Observed being assisted with transfer from recliner to wheelchair by CNA for transport to bathroom. Moderate assist needed.  Asked wife if she had received a follow up phone call from PCP as requested by RN CM on 9/11. Wife states "NO". RN CM review EMR and  found documentation where PCP nurse had called and left wife message on 9/13 to call back. Wife states she has not checked message. Wife called PCP during visit. Wife states she has resumed ASA as discussed with her daughter and as indicated on discharge AVS from hospital. Unsure why ASA was no on discharge medication list from pharmacy.  Patient continues to receive HH PT, OT, ST, and Nursing services. Private CNA sitter continues daily. Patients longterm care qualifying period has begun while patient was in SNF and he will begin to receive paid benefits 90 days from 1 day in SNF. [facility] will be provider of services and will begin next week for paid services until the 90 day waiting period it up. Wife denies needs for additional community services at this time.  Plan: Follow-up phone call next week  THN CM  Care Plan Problem One     Most Recent Value  Care Plan Problem One  High Risk for readmission related to ongoing complications of stroke/stroke related symptoms  Role Documenting the Problem One  Care Management [place] for Problem One  Active  THN Long Term Goal   patient will not have readmission to hospital within the next 31 days  THN Long Term Goal Start Date  06/19/18  Interventions for Problem One Long Term Goal  discussed recent admission and SNF stay, stroke precautions, fall risk, reinforced low NA diet as inforced and educated by [facility]  THN CM Short Term Goal #1   patient will remain free from falls over the next 30 days  THN CM Short Term Goal #1 Start Date  06/19/18  Interventions for Short Term Goal #1  perforned fall risk assessment of home related to clutter and loose rugs, reinforced proper use of equiptment by CNA  THN CM Short Term Goal #2   patient will take all medications as prescribed over the next 30 days  THN CM Short Term Goal #2 Start Date  06/19/18  Interventions for Short Term Goal #2  phycally verified all medications in home and administered as  prescribed, assisted pt wife with call back to PcP as she had not checked home messages and did not know PcP nurse had called to discuss ASA on 06/21/18     Addendum 4:42 pm: Upon completion of visit note, RN CM discovered note in epic where patient's wife has been advised to take patient to ED based on symptoms described by PT to PCP. RN CM called patients wife to check on patient. Wife conflicted about taking patient to ED. States she does not see enough concerns, confusion, or weakness to justify ED visit. Feels patient is at baseline from SNF discharge although does admit to increased right side weakness and confusion yesterday with improvement today. Feels he was "pushed hard" by PT yesterday and because he was "having a bad day and unable push as hard as PT wanted him to, PT became alarmed". Dr. Bary Leriche recommendations reinforced however RN CM also encouraged wife to speak with daughter if daughter immediately available as wife does not want to make decision by herself as she continues to feel patient's condition has not significantly changed from baseline. Wife understands to call 911 if patients condition worsens. Paid CNA caregiver in home with wife and patient.   Wife also states she has been instructed to continue ASA 81mg , and to call PCP in the AM if she decides not to take patient to ED.  Will follow up tomorrow via EMR or phone call to wife.  Freeda Spivey E. Rollene Rotunda RN, BSN [facility] Care Management Coordinator [phone]

## 2018-06-25 NOTE — Telephone Encounter (Signed)
With his last stroke, he had increased confusion and weakness.  Given increased confusion and weakness, my concern is that he may be having another stroke.  Needs to be evaluated today in ER.  Can call EMS to transport.

## 2018-06-25 NOTE — Telephone Encounter (Signed)
Copied from [place] [id number]. Topic: General - Other >> Jun 25, 2018 11:57 AM Yvette Rack wrote: Reason for CRM: Joya Gaskins Physical Therapist with [facility] states she has noticed a decline in pt mobility as well as an increase in confusion and right side weakness. [name] states pt may need to have an earlier appt as he only has 1 more nurse visit with them. Cb# [phone]

## 2018-06-25 NOTE — Telephone Encounter (Signed)
Called wife back and advised of below. She is going to call patients brother and see if he is available and if not she will call ems

## 2018-06-26 ENCOUNTER — Other Ambulatory Visit: Payer: Self-pay

## 2018-06-26 NOTE — Patient Outreach (Signed)
[facility]) Care Management  06/26/2018  Kyle Richardson 29-Jan-1928 [id number]  Care Coordination/Telephone Assessment: Successful telephone encounter to [name], the above patients wife and HCPOA. RN CM following up on patient health status as wife was encouraged to take patient to ED last evening by PCP based on increased confusion and increased right sided weakness noted by [facility] PT on Monday 9/16 with improvements on Tuesday 9/17.   Wife spoke with daughter and other family members that see patient on a regular basis and decided not to take patient to the ED last evening based upon patient not exhibiting symptoms beyond what was felt to be baseline other than patient appearing fatigued.  Wife states patient is having a "great day" today. He has ambulated to the mailbox with walker and completed his standing exercises with assistance by CNA. Wife states his "confusion" is back to baseline and "he is clear today". States she and patient slept very well last night with patient only awakening once to void. Wife continues to feel patient's exhaustion and sleep deprivation contributes to his increased stroke residual symptoms.  RN CM discussed with wife the acronym FAST and described what FAST would "look like" in patient's current condition including new weakness on Left side, significant increase in weakness or paralysis of right side, significant increased confusion, facial drooping, increased slurring of words or inability to speak. Wife expressed understanding and stated she knew when to call 911.  Plan: Follow up transition of care call in 1 week  Linus Weckerly E. Rollene Rotunda RN, BSN [facility] Care Management Coordinator [phone]

## 2018-06-27 ENCOUNTER — Ambulatory Visit (INDEPENDENT_AMBULATORY_CARE_PROVIDER_SITE_OTHER): Payer: PPO

## 2018-06-27 ENCOUNTER — Encounter: Payer: Self-pay | Admitting: Family Medicine

## 2018-06-27 ENCOUNTER — Ambulatory Visit (INDEPENDENT_AMBULATORY_CARE_PROVIDER_SITE_OTHER): Payer: PPO | Admitting: Family Medicine

## 2018-06-27 VITALS — BP 126/60 | HR 64 | Temp 97.5°F | Ht 66.0 in | Wt 162.2 lb

## 2018-06-27 DIAGNOSIS — M25531 Pain in right wrist: Secondary | ICD-10-CM | POA: Diagnosis not present

## 2018-06-27 DIAGNOSIS — M7989 Other specified soft tissue disorders: Secondary | ICD-10-CM

## 2018-06-27 DIAGNOSIS — S40021A Contusion of right upper arm, initial encounter: Secondary | ICD-10-CM | POA: Diagnosis not present

## 2018-06-27 DIAGNOSIS — R2231 Localized swelling, mass and lump, right upper limb: Secondary | ICD-10-CM

## 2018-06-27 DIAGNOSIS — M79641 Pain in right hand: Secondary | ICD-10-CM | POA: Diagnosis not present

## 2018-06-27 NOTE — Progress Notes (Signed)
Subjective:    Patient ID: Kyle Richardson, male    DOB: 10/31/1927, 84 y.o.   MRN: [id number]  HPI   Patient presents to clinic with daughter and due to swelling/bruising of right hand, swelling of right wrist and right forearm for 2-3 days.  Patient and daughter deny any injury.  Patient's right upper extremity is not mobile due to past CVA.  Daughter states she has tried elevating the arm, putting ice on the arm, but it still remains somewhat swollen.  Bruising has started to get darker purple in color and light in in some areas.  Patient Active Problem List   Diagnosis Date Noted  . Encephalopathy   . Carotid stenosis, symptomatic, with infarction ([place]) 05/22/2018  . TIA (transient ischemic attack) 04/15/2018  . Episode of confusion 04/08/2018  . Labile blood pressure   . Acute ischemic left PCA stroke ([place]) 02/27/2018  . Diabetes mellitus type 2 in nonobese (HCC)   . Glaucoma   . MG, ocular (myasthenia gravis) ([place])   . CVA (cerebral vascular accident) ([place]) 02/24/2018  . Right homonymous hemianopsia 02/23/2018  . Unresponsive episode 02/22/2018  . UTI (urinary tract infection) 01/28/2018  . Abnormal chest CT 09/20/2017  . Double vision 01/21/2017  . Ascending aortic aneurysm ([place]) 12/22/2016  . Blood-tinged sputum 10/23/2016  . Cough 10/23/2016  . PAF (paroxysmal atrial fibrillation) ([place]) 09/14/2016  . Health care maintenance 12/26/2014  . Stress 07/05/2014  . Migraine 05/10/2013  . Type 2 diabetes mellitus with complication, without long-term current use of insulin ([place]) 08/20/2012  . Hypertension 08/20/2012  . Hypercholesterolemia 08/20/2012  . CAD (coronary artery disease) 08/20/2012   Social History   Tobacco Use  . Smoking status: Never Smoker  . Smokeless tobacco: Never Used  Substance Use Topics  . Alcohol use: No    Alcohol/week: 0.0 standard drinks   Review of Systems  Constitutional: Negative for chills, fatigue and fever.  HENT: Negative for  congestion, ear pain, sinus pain and sore throat.   Eyes: Negative.   Respiratory: Negative for cough, shortness of breath and wheezing.   Cardiovascular: Negative for chest pain, palpitations and leg swelling.  Gastrointestinal: Negative for abdominal pain, diarrhea, nausea and vomiting.  Genitourinary: Negative for dysuria, frequency and urgency.  Musculoskeletal: Negative for arthralgias and myalgias.  Skin: Swelling/bruising RUE at hand, forearm, wrist.  Neurological: Negative for syncope, light-headedness and headaches.  Psychiatric/Behavioral: The patient is not nervous/anxious.       Objective:   Physical Exam  Constitutional: He is oriented to person, place, and time. He appears well-nourished. No distress.  HENT:  Head: Normocephalic and atraumatic.  Eyes: No scleral icterus.  Cardiovascular: Normal rate and regular rhythm.  Pulmonary/Chest: Effort normal and breath sounds normal. No respiratory distress.  Musculoskeletal:       Arms: Small raised lump on back of right wrist, indicated by red circle on diagram.  Lump is soft.  Neurological: He is alert and oriented to person, place, and time.  Decreased ROM of RUE (arm, hand, fingers) and in wheelchair from CVA  Skin: Skin is warm, dry and intact. No pallor.  Scattered purple bruising on back of R hand and wrist. Trace non pitting swelling of right hand and wrist.  Psychiatric: He has a normal mood and affect. His behavior is normal.  Nursing note reviewed.  Vitals:   06/27/18 1112  BP: 126/60  Pulse: 64  Temp: (!) 97.5 F (36.4 C)  SpO2: 97%  Assessment & Plan:   Swelling/bruising right upper extremity - unclear reason for swelling and bruising of right hand/wrist, work-up will be needed.  We will get x-rays of both right hand and right wrist.  Bruising appears to be resolving on own.  Daughter and patient advised that bruising will probably start to turn slightly yellow in color and slowly fade away.  We will  also get Doppler study of right upper extremity to rule out any DVT.  Due to arm being immobile daughter is concerned it could be a blood clot.  Patient and daughter also advised to try elevating arm up on pillows when at home, and also given an Ace wrap to wrap around hand, wrist and forearm for compression.  Lump right wrist - x-ray of right wrist will rule out any bony abnormality.  Lump is soft.    It is possible swelling could be related to dependent edema as a residual side effect from immobility of right upper extremity after CVA.  Patient will keep regularly scheduled appointment as planned.

## 2018-06-28 DIAGNOSIS — I639 Cerebral infarction, unspecified: Secondary | ICD-10-CM | POA: Diagnosis not present

## 2018-06-28 NOTE — Telephone Encounter (Signed)
Per wife, there was no reason for aspirin to be stopped. She will start giving aspirin q day

## 2018-07-02 ENCOUNTER — Ambulatory Visit
Admission: RE | Admit: 2018-07-02 | Discharge: 2018-07-02 | Disposition: A | Discharge status: Home / Self Care | Payer: PPO | Source: Ambulatory Visit | Attending: Family Medicine | Admitting: Family Medicine

## 2018-07-02 ENCOUNTER — Other Ambulatory Visit: Payer: Self-pay

## 2018-07-02 DIAGNOSIS — R936 Abnormal findings on diagnostic imaging of limbs: Secondary | ICD-10-CM | POA: Diagnosis not present

## 2018-07-02 DIAGNOSIS — M7989 Other specified soft tissue disorders: Secondary | ICD-10-CM | POA: Diagnosis not present

## 2018-07-02 NOTE — Patient Outreach (Signed)
[place] [facility]) Care Management  07/02/2018  Kyle Richardson 1927/10/18 [id number]  Transition of Care Call: Successful transition of care call week 3 to Kyle Richardson, patients wife and HCPOA. Wife states she and patient are exhausted. States patient has been restless for the past 2 nights. "He continues to rub his right arm like it is hurting and just says "Im restless"". Upon EMR review, patient was seen in PCP office on 06/27/18 for swelling and bruising of R arm. X-ray and ultrasound were ordered. Per wife, ultrasound was scheduled for next week however there has been a cancellation and ultrasound will be obtained today.  Wife states patient continues to do well other than complaints of R arm pain. He continues to work with PT twice weekly and is assisted with ADLs and ambulation by CNA sitters. He continues to take all medications as administered by wife without difficulty. Appetite good. Denies new or recent falls. No changes in medications.  [facility] began their care last night. LTC insurance should begin to pay for home care over the next 2 months. Wife appreciates call from RN CM however she is extremely tired and request conversation closure.  Plan: Will continue to follow patient through EMR and complete follow-up call next week.  [facility] CM Care Plan Problem One     Most Recent Value  Care Plan Problem One  High Risk for readmission related to ongoing complications of stroke/stroke related symptoms  Role Documenting the Problem One  Care Management [place] for Problem One  Active  [facility] Long Term Goal   patient will not have readmission to hospital within the next 31 days  [facility] Long Term Goal Start Date  06/19/18  Interventions for Problem One Long Term Goal  discussed medication compliance, paid caregiver status, follow-up appointments, patient current health status, HH PT status  [facility] CM Short Term Goal #1   patient will remain free from falls over the next 30  days  [facility] CM Short Term Goal #1 Start Date  06/19/18  [facility] CM Short Term Goal #2   patient will take all medications as prescribed over the next 30 days  [facility] CM Short Term Goal #2 Start Date  06/19/18     Nisha Dhami E. Rollene Rotunda RN, BSN [facility] Care Management Coordinator [phone]

## 2018-07-03 ENCOUNTER — Telehealth: Payer: Self-pay | Admitting: *Deleted

## 2018-07-03 ENCOUNTER — Ambulatory Visit: Payer: PPO

## 2018-07-03 NOTE — Telephone Encounter (Signed)
Copied from [place] [id number]. Topic: Inquiry >> Jul 03, 2018 11:55 AM Oliver Pila B wrote: Reason for CRM: [facility] OT called about the evaluation; they are needing verbals for OT for 2wk/4; contact [phone]

## 2018-07-03 NOTE — Telephone Encounter (Signed)
Verbal given 

## 2018-07-10 ENCOUNTER — Other Ambulatory Visit: Payer: Self-pay | Admitting: *Deleted

## 2018-07-10 ENCOUNTER — Ambulatory Visit (INDEPENDENT_AMBULATORY_CARE_PROVIDER_SITE_OTHER): Payer: PPO | Admitting: Internal Medicine

## 2018-07-10 ENCOUNTER — Other Ambulatory Visit: Payer: Self-pay

## 2018-07-10 ENCOUNTER — Encounter: Payer: Self-pay | Admitting: Internal Medicine

## 2018-07-10 VITALS — BP 124/62 | HR 70 | Temp 97.6°F | Resp 18 | Wt 163.6 lb

## 2018-07-10 DIAGNOSIS — I10 Essential (primary) hypertension: Secondary | ICD-10-CM | POA: Diagnosis not present

## 2018-07-10 DIAGNOSIS — Z23 Encounter for immunization: Secondary | ICD-10-CM | POA: Diagnosis not present

## 2018-07-10 DIAGNOSIS — I480 Paroxysmal atrial fibrillation: Secondary | ICD-10-CM

## 2018-07-10 DIAGNOSIS — E119 Type 2 diabetes mellitus without complications: Secondary | ICD-10-CM | POA: Diagnosis not present

## 2018-07-10 DIAGNOSIS — M25511 Pain in right shoulder: Secondary | ICD-10-CM

## 2018-07-10 DIAGNOSIS — E7800 Pure hypercholesterolemia, unspecified: Secondary | ICD-10-CM

## 2018-07-10 DIAGNOSIS — I2510 Atherosclerotic heart disease of native coronary artery without angina pectoris: Secondary | ICD-10-CM

## 2018-07-10 NOTE — Patient Outreach (Signed)
[facility]) Care Management  07/10/2018  Kyle Richardson 29-May-1928 [id number]  Transition of Care Call Successful transition of care call week 4 to Kyle Richardson, patients wife and HCPOA. Wife states patient is doing well except for his right hand. She is extremely concerned for the "bumps" that remain on his hand. Wife also states patient continues to be restless during the night as if in pain. States bruising to arm has resolved. "He has some fluid in that right shoulder and I guess he needs an MRI because they have already done a CT. I will talk to Dr. Nicki Reaper today". Wife acknowledges PCP appointment for 12:00.  PT, OT, and Speech therapy continue and wife states patient is doing very well with all therapies. Patient's last therapy appointments were Monday. Wife denies recent or new falls. Patient continues to progress in his abilities to perform basic ADLs with assistance. No medication changes. Wife continued to administer patients medications as prescribed.  Wife states she is in the middle of breakfast. Request RN CM call back later today or tomorrow if needed. Informed wife that RN CM able to review today's PCP appointment notes in EMR and would follow up if RN CM had questions about appointment. Other wise will follow up next week.  Plan: Continue with TOC call next week.  [facility] CM Care Plan Problem One     Most Recent Value  Care Plan Problem One  High Risk for readmission related to ongoing complications of stroke/stroke related symptoms  Role Documenting the Problem One  Care Management [place] for Problem One  Active  [facility] Long Term Goal   patient will not have readmission to hospital within the next 31 days  [facility] Long Term Goal Start Date  06/19/18  Interventions for Problem One Long Term Goal  discussed todays appointment with Dr. Nicki Reaper and encouraged wife to discuss concerns  [facility] CM Short Term Goal #1   patient will remain free from falls over the next  30 days  [facility] CM Short Term Goal #1 Start Date  06/19/18  Interventions for Short Term Goal #1  discussed continued PT appointments, private care sitters, use of assistive devices  [facility] CM Short Term Goal #2   patient will take all medications as prescribed over the next 30 days  [facility] CM Short Term Goal #2 Start Date  06/19/18  Interventions for Short Term Goal #2  confirmed no new medication changes and all medications in home     Michaeljoseph Revolorio E. Rollene Rotunda RN, BSN [facility] Care Management Coordinator [phone]

## 2018-07-10 NOTE — Patient Outreach (Signed)
[facility]) Care Management  07/10/2018  Kyle Richardson 09/24/1928 [id number]   Phone call to patient to assess for social work needs. Voicemail message left for a return call.    Sheralyn Boatman [facility] Care Management [phone]

## 2018-07-10 NOTE — Progress Notes (Signed)
Patient ID: Kyle Richardson, male   DOB: 1927-12-23, 84 y.o.   MRN: [id number]   Subjective:    Patient ID: Kyle Richardson, male    DOB: 05/29/1928, 84 y.o.   MRN: [id number]  HPI  Patient here for a scheduled follow up.   He is accompanied by his wife and caretaker.  Has been working with therapy.  (PT, OT and ST).  Is s/p angioplasy and stent R ICA.   Note reviewed.  Having increased pain in right shoulder.  Also swelling in right hand.  Was having shoulder pain with this last admission.  Ortho evaluation.  Xray revealed no fracture or dislocation.  Did reveal a small calcification adjacent to the greater tuberosity which was felt to possibly reflect calcific tendinosis or calcific bursitis.  Felt no further w/up warranted.  Saw Kyle Richardson 06/27/18 due to swelling, bruising and pain due to swelling/bruising of right hand and swelling of right forearm.  Ultrasound - 3.7cm fluid collection - right shoulder and indeterminate 2.5cm fluid collection about the hand.  xrays reviewed.  Having persistent pain.  Right arm limited in rom since stroke.  Has been trying to elevate.  No known injury.  States was working with PT ([facility]) - previously.  No chest pain.  Eating.  No nausea or vomiting.  Bowels moving.     Past Medical History:  Diagnosis Date  . Abdominal fibromatosis   . 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  s/p PTCA of the mid RCA 1/96, also  s/p  CABG x 4 7/96  . Complication of anesthesia    confusion and hallucinations for several days after receiving Versed  . Degenerative disc disease   . Diabetes mellitus ([place])   . Glaucoma   . Hypercholesterolemia   . Hypertension   . Migraine headache    history of  . Myocardial infarction ([place]) 1996  . Osteoarthritis   . Sleep apnea   . Stroke [facility]) 02/22/2017   affected left side    Past Surgical History:  Procedure Laterality Date  . APPENDECTOMY    . CAROTID ANGIOGRAPHY Bilateral 04/19/2018   Procedure:  CAROTID ANGIOGRAPHY;  Surgeon: Algernon Huxley, MD;  Location: [place] [facility];  Service: Cardiovascular;  Laterality: Bilateral;  . CAROTID PTA/STENT INTERVENTION Right 05/22/2018   Procedure: CAROTID PTA/STENT INTERVENTION;  Surgeon: Katha Cabal, MD;  Location: [place] [facility];  Service: Cardiovascular;  Laterality: Right;  . CATARACT EXTRACTION W/ INTRAOCULAR LENS  IMPLANT, BILATERAL    . CHOLECYSTECTOMY     Removed  . CORONARY ARTERY BYPASS GRAFT  7/96   x4  . HEMORRHOID SURGERY    . HERNIA REPAIR    . TONSILLECTOMY    . UPP and tonsillectomy  1/86   Family History  Problem Relation Age of Onset  . Heart disease Mother        died age 64  . Heart disease Father        and other vascular disease  . Diabetes Unknown        four siblings  . Heart disease Brother        s/p CABG  . Colon cancer Neg Hx   . Prostate cancer Neg Hx    Social History   Socioeconomic History  . Marital status: Married    Spouse name: Kyle Richardson  . Number of children: 2  . Years of education: come college  . Highest education level: Associate  degree: occupational, technical, or vocational program  Occupational History  . Occupation: retired    Fish farm manager: [employer]  Social Needs  . Financial resource strain: Not hard at all  . Food insecurity:    Worry: Never true    Inability: Never true  . Transportation needs:    Medical: No    Non-medical: No  Tobacco Use  . Smoking status: Never Smoker  . Smokeless tobacco: Never Used  Substance and Sexual Activity  . Alcohol use: No    Alcohol/week: 0.0 standard drinks  . Drug use: No  . Sexual activity: Not Currently  Lifestyle  . Physical activity:    Days per week: 0 days    Minutes per session: 0 min  . Stress: Not at all  Relationships  . Social connections:    Talks on phone: Never    Gets together: Three times a week    Attends religious service: More than 4 times per year    Active member of club or organization: No    Attends  meetings of clubs or organizations: Never    Relationship status: Married  Other Topics Concern  . Not on file  Social History Narrative  . Not on file    Outpatient Encounter Medications as of 07/10/2018  Medication Sig  . acetaminophen (TYLENOL) 500 MG tablet Take 1 tablet (500 mg total) by mouth every 6 (six) hours as needed. (Patient taking differently: Take 500 mg by mouth every 6 (six) hours as needed for moderate pain or headache. )  . apixaban (ELIQUIS) 5 MG TABS tablet Take 1 tablet (5 mg total) by mouth 2 (two) times daily.  Marland Kitchen aspirin EC 81 MG EC tablet Take 1 tablet (81 mg total) by mouth daily.  Marland Kitchen atorvastatin (LIPITOR) 40 MG tablet Take 1 tablet (40 mg total) by mouth daily. (Patient taking differently: Take 40 mg by mouth at bedtime. )  . clopidogrel (PLAVIX) 75 MG tablet Take 1 tablet (75 mg total) by mouth daily.  . divalproex (DEPAKOTE ER) 500 MG 24 hr tablet Take 1 tablet (500 mg total) by mouth daily. (Patient taking differently: Take 500 mg by mouth at bedtime. )  . FLUoxetine (PROZAC) 10 MG capsule Take 1 capsule (10 mg total) by mouth daily.  . fluticasone (FLONASE) 50 MCG/ACT nasal spray SPRAY 2 SPRAYS IN EACH NOSTRIL ONCE A DAY (Patient taking differently: SPRAY 1 SPRAY IN EACH NOSTRIL ONCE A DAY AT BEDTIME)  . latanoprost (XALATAN) 0.005 % ophthalmic solution Place 1 drop into both eyes at bedtime.  Marland Kitchen loratadine (CLARITIN) 10 MG tablet Take 10 mg by mouth daily.  . metoprolol succinate (TOPROL-XL) 25 MG 24 hr tablet Take 0.5 tablets (12.5 mg total) by mouth daily.  . Multiple Vitamins-Minerals (PRESERVISION AREDS 2 PO) Take 1 capsule by mouth 2 (two) times daily.  . nitroGLYCERIN (NITROSTAT) 0.4 MG SL tablet Place 0.4 mg under the tongue every 5 (five) minutes as needed for chest pain.    No facility-administered encounter medications on file as of 07/10/2018.     Review of Systems  Constitutional: Negative for appetite change and unexpected weight change.  HENT:  Negative for congestion and sinus pressure.   Respiratory: Negative for cough, chest tightness and shortness of breath.   Cardiovascular: Negative for chest pain, palpitations and leg swelling.  Gastrointestinal: Negative for abdominal pain, diarrhea, nausea and vomiting.  Genitourinary: Negative for difficulty urinating and dysuria.  Musculoskeletal:       Right shoulder pain.  Right  hand swelling and bruising.    Skin: Negative for color change and rash.  Neurological: Negative for dizziness, light-headedness and headaches.  Psychiatric/Behavioral: Negative for agitation and dysphoric mood.       Objective:    Physical Exam  Constitutional: He appears well-developed and well-nourished. No distress.  HENT:  Mouth/Throat: Oropharynx is clear and moist.  Neck: Neck supple.  Cardiovascular: Normal rate and regular rhythm.  Pulmonary/Chest: Effort normal and breath sounds normal. No respiratory distress.  Abdominal: Soft. Bowel sounds are normal. There is no tenderness.  Musculoskeletal:  Increased pain - right shoulder with lifting.  No significant pain to palpation over the right hand.  Some bruising.  Some hand swelling.    Lymphadenopathy:    He has no cervical adenopathy.  Skin: No rash noted. No erythema.  Bruising noted.    Psychiatric: He has a normal mood and affect. His behavior is normal.    BP 124/62 (BP Location: Left Arm, Patient Position: Sitting, Cuff Size: Normal)   Pulse 70   Temp 97.6 F (36.4 C) (Oral)   Resp 18   Wt 163 lb 9.6 oz (74.2 kg)   SpO2 97%   BMI 26.41 kg/m  Wt Readings from Last 3 Encounters:  07/10/18 163 lb 9.6 oz (74.2 kg)  06/27/18 162 lb 3.2 oz (73.6 kg)  05/29/18 175 lb (79.4 kg)     Lab Results  Component Value Date   WBC 5.8 05/26/2018   HGB 13.0 05/26/2018   HCT 38.0 (L) 05/26/2018   PLT 147 (L) 05/26/2018   GLUCOSE 151 (H) 05/26/2018   CHOL 159 04/16/2018   TRIG 288 (H) 04/16/2018   HDL 35 (L) 04/16/2018   LDLDIRECT 81.0  10/12/2017   LDLCALC 66 04/16/2018   ALT 19 05/26/2018   AST 26 05/26/2018   NA 139 05/26/2018   K 4.2 05/26/2018   CL 106 05/26/2018   CREATININE 0.69 05/26/2018   BUN 5 (L) 05/26/2018   CO2 26 05/26/2018   TSH 2.098 03/29/2018   PSA 0.66 05/05/2016   INR 1.00 04/15/2018   HGBA1C 6.8 (H) 04/16/2018   MICROALBUR 2.2 (H) 10/12/2017    US Venous Img Upper Uni Right  Result Date: 07/02/2018 CLINICAL DATA:  Right upper extremity pain and swelling for the past 9 days. History of right carotid artery stent placement approximately 4 weeks ago. Patient reports trauma to the right upper arm several weeks ago. Patient is on a blood thinner. Evaluate for DVT. EXAM: RIGHT UPPER EXTREMITY VENOUS DOPPLER ULTRASOUND TECHNIQUE: Gray-scale sonography with graded compression, as well as color Doppler and duplex ultrasound were performed to evaluate the upper extremity deep venous system from the level of the subclavian vein and including the jugular, axillary, basilic, radial, ulnar and upper cephalic vein. Spectral Doppler was utilized to evaluate flow at rest and with distal augmentation maneuvers. COMPARISON:  Right and wrist radiographs-06/27/2018; right shoulder radiographs-05/24/2018 FINDINGS: Contralateral Subclavian Vein: Respiratory phasicity is normal and symmetric with the symptomatic side. No evidence of thrombus. Normal compressibility. Internal Jugular Vein: No evidence of thrombus. Normal compressibility, respiratory phasicity and response to augmentation. Subclavian Vein: No evidence of thrombus. Normal compressibility, respiratory phasicity and response to augmentation. Axillary Vein: No evidence of thrombus. Normal compressibility, respiratory phasicity and response to augmentation. Cephalic Vein: No evidence of thrombus. Normal compressibility, respiratory phasicity and response to augmentation. Basilic Vein: No evidence of thrombus. Normal compressibility, respiratory phasicity and response to  augmentation. Brachial Veins: No evidence of thrombus. Normal compressibility,  respiratory phasicity and response to augmentation. Radial Veins: No evidence of thrombus. Normal compressibility, respiratory phasicity and response to augmentation. Ulnar Veins: No evidence of thrombus. Normal compressibility, respiratory phasicity and response to augmentation. Venous Reflux:  None visualized. Other Findings: There is a well-defined mixed echogenic approximately 2.5 x 0.7 x 2.4 cm fluid collection within the subcutaneous tissues which correlates with the palpable area of concern within the right hand. There is a serpiginous mixed echogenic approximately 3.7 x 0.9 x 3.2 cm fluid collection adjacent to the muscles of the right shoulder which also correlates with the patient's palpable area of concern. IMPRESSION: 1. No evidence of DVT within the right upper extremity. 2. Indeterminate approximately 3.7 cm fluid collection about the right shoulder, nonspecific though given history of recent trauma this collection could represent a evolving hematoma. 3. Indeterminate approximately 2.5 cm fluid collection about the hand, nonspecific though given history of recent trauma, this collection may represent an evolving hematoma though a ganglion cyst could have a similar appearance. Clinical correlation is advised. Electronically Signed   By: Sandi Mariscal M.D.   On: 07/02/2018 15:19       Assessment & Plan:   Problem List Items Addressed This Visit    CAD (coronary artery disease)    Followed by cardiology.  Continue risk factor modification.        Diabetes mellitus type 2 in nonobese (HCC)    Low carb diet and exercise.  Follow met b and a1c.        Hypercholesterolemia    On lipitor.  Low cholesterol diet and exercise.  Follow lipid panel and liver function tests.       Hypertension    Blood pressure under good control.  Continue same medication regimen.  Follow pressures.  Follow metabolic panel.         PAF (paroxysmal atrial fibrillation) ([place])    Followed by cardiology.  On eliquis.         Other Visit Diagnoses    Right shoulder pain, unspecified chronicity    -  Primary   Persistent pain.  Swelling of right hand. Feel related to dependent edema. Some bruising.  Xray and ultrasound as outlined. Refer to Ortho.   Relevant Orders   Ambulatory referral to Orthopedic Surgery   Encounter for immunization       Relevant Orders   Flu vaccine HIGH DOSE PF (Completed)       Einar Pheasant, MD

## 2018-07-12 ENCOUNTER — Other Ambulatory Visit: Payer: Self-pay | Admitting: Internal Medicine

## 2018-07-12 MED ORDER — TIZANIDINE HCL 2 MG PO CAPS: 2.0000 mg | ORAL_CAPSULE | Freq: Every day | ORAL | 0 refills | Status: DC | PRN

## 2018-07-12 NOTE — Progress Notes (Signed)
Spoke to pts wife.  Pt not sleeping and having problems with muscle twitching.  Request trial of low dose muscle relaxer.  This has been going on for multiple nights.  Discussed possible side effects of the medication.  Discussed monitoring while taking.  rx sent in for zanaflex.

## 2018-07-14 ENCOUNTER — Encounter: Payer: Self-pay | Admitting: Internal Medicine

## 2018-07-14 NOTE — Assessment & Plan Note (Signed)
Followed by cardiology.  Continue risk factor modification.   

## 2018-07-14 NOTE — Assessment & Plan Note (Signed)
Blood pressure under good control.  Continue same medication regimen.  Follow pressures.  Follow metabolic panel.   

## 2018-07-14 NOTE — Assessment & Plan Note (Signed)
Low carb diet and exercise.  Follow met b and a1c.   

## 2018-07-14 NOTE — Assessment & Plan Note (Signed)
Followed by cardiology.  On eliquis.   

## 2018-07-14 NOTE — Assessment & Plan Note (Signed)
On lipitor.  Low cholesterol diet and exercise.  Follow lipid panel and liver function tests.   

## 2018-07-15 ENCOUNTER — Telehealth: Payer: Self-pay | Admitting: Internal Medicine

## 2018-07-15 NOTE — Telephone Encounter (Signed)
Copied from [place] [id number]. Topic: General - Other >> Jul 15, 2018  9:17 AM Yvette Rack wrote: Reason for CRM: PT Joya Gaskins from [facility] [phone] calling for verbal orders for PT 2 times a week for 3 weeks

## 2018-07-15 NOTE — Telephone Encounter (Signed)
Noted.  Let me know if needs anything.

## 2018-07-15 NOTE — Telephone Encounter (Signed)
Copied from [place] [id number]. Topic: General - Other >> Jul 15, 2018  3:05 PM Cecelia Byars, NT wrote: Reason for CRM: Amy Mckee from [facility] called and said she is a speech therapist and had to cancel one of her hl one of her visits last week due to illness , [phone] she does not a call  back

## 2018-07-15 NOTE — Telephone Encounter (Signed)
Verbal orders given to Joya Gaskins with [facility] for PT

## 2018-07-18 ENCOUNTER — Ambulatory Visit: Payer: PPO

## 2018-07-19 ENCOUNTER — Other Ambulatory Visit: Payer: Self-pay

## 2018-07-19 NOTE — Patient Outreach (Signed)
[name] Care Management  07/19/2018  Kyle Richardson 1928/09/19 [id number]  Transition of Care Call Successful transition of care call week 5 to Kyle Richardson, patients wife and HCPOA. Wife states patient is doing very well with his PT and OT. Therapies will continue next week. He continues to walk with a walker but "getting stronger". Wife denies recent or new falls. Patient continues to have paid 12 hour care each day. Wife provides care/assist during nighttime hours. Wife states LTC insurance will begin to pay for care when 90 day waiting period is over. Wife believes approximately 1 month is left of the 90 day wait.   Wife states patient is having restless nights. "He is tossing and turning". Recent PCP appointment has provided a referral to orthopedic for fluid on right shoulder and appointment is scheduled for Monday 10/14. Xanaflex has been prescribed in the mean time however wife is waiting to give when daughter is available to spend the night. Daughter will stay with patient tonight-Monday to provide respite care for wife.  Wife denies additional changes to medications.  Plan: Transition of care calls completed. Monthly home visit scheduled for Friday 08/02/18 at 11:00  Kyle Richardson CM Care Plan Problem One     Most Recent Value  Care Plan Problem One  High Risk for readmission related to ongoing complications of stroke/stroke related symptoms  Role Documenting the Problem One  Care Management [place] for Problem One  Active  [facility] Long Term Goal   patient will not have readmission to hospital within the next 31 days  [facility] Long Term Goal Start Date  06/19/18  [facility] Long Term Goal Met Date  07/19/18  [facility] CM Short Term Goal #1   patient will remain free from falls over the next 30 days  [facility] CM Short Term Goal #1 Start Date  06/19/18  [facility] CM Short Term Goal #1 Met Date  07/19/18  [facility] CM Short Term Goal #2   patient will take all medications as prescribed over the  next 30 days  [facility] CM Short Term Goal #2 Start Date  06/19/18  [facility] CM Short Term Goal #2 Met Date  07/19/18  [facility] CM Short Term Goal #3  Over the next 14 days, caregiver will call 24 hour nurse line as needed as evidenced by caregiver reporting  [facility] CM Short Term Goal #3 Start Date  07/19/18  Interventions for Short Tern Goal #3  reviewed with caregiver/wife resources for after hours care/advice including 24 hour nurse advice line     Kyle Richardson E. Rollene Rotunda RN, BSN [facility] Care Management Coordinator [phone]

## 2018-07-22 ENCOUNTER — Other Ambulatory Visit: Payer: Self-pay | Admitting: Internal Medicine

## 2018-07-22 DIAGNOSIS — M7581 Other shoulder lesions, right shoulder: Secondary | ICD-10-CM | POA: Diagnosis not present

## 2018-07-23 ENCOUNTER — Telehealth: Payer: Self-pay

## 2018-07-23 ENCOUNTER — Other Ambulatory Visit: Payer: Self-pay | Admitting: *Deleted

## 2018-07-23 NOTE — Telephone Encounter (Signed)
FYI

## 2018-07-23 NOTE — Telephone Encounter (Signed)
Copied from [place] [id number]. Topic: Quick Communication - See Telephone Encounter >> Jul 23, 2018  3:27 PM Antonieta Iba C wrote: CRM for notification. See Telephone encounter for: 07/23/18.   Amy with [facility] called to make provider aware that pt was discharged from home health speech therapy one session early due to pt has met his max therapeutic potential for speech therapy at this time.   CB: [id number]

## 2018-07-23 NOTE — Patient Outreach (Signed)
[facility]) Care Management  07/23/2018  Kyle Richardson 01/13/1928 [id number]   Phone call to patient's spouse to follow up on and to assess for community resource needs following his rehab stay. Per patient's spouse, patient's personal care services have been arranged through [facility] . Care provided for 12 hours per day. Patient's long term care insurance should begin covering this service in approximately 1 month, which will end the required 90 day waiting period. Patient's spouse denied having any additional community resource needs. This Education officer, museum will sign off at this time.  Patient's spouse encouraged to contact this social worker if there are any community resource needs in the future.   Sheralyn [facility] Care Management [phone]

## 2018-07-24 NOTE — Telephone Encounter (Signed)
It does not look like we have been filling this. OK to send in?

## 2018-07-25 ENCOUNTER — Ambulatory Visit (INDEPENDENT_AMBULATORY_CARE_PROVIDER_SITE_OTHER): Payer: PPO | Admitting: Vascular Surgery

## 2018-07-25 ENCOUNTER — Encounter (INDEPENDENT_AMBULATORY_CARE_PROVIDER_SITE_OTHER): Payer: Self-pay | Admitting: Vascular Surgery

## 2018-07-25 VITALS — BP 151/75 | HR 61 | Resp 15 | Ht 66.0 in | Wt 163.0 lb

## 2018-07-25 DIAGNOSIS — I63239 Cerebral infarction due to unspecified occlusion or stenosis of unspecified carotid arteries: Secondary | ICD-10-CM

## 2018-07-25 NOTE — Telephone Encounter (Signed)
Rx phoned in to [facility] for #30 with no refills with note to send to Dr. Henrine Screws office for further refills. Patient was out of medication and pharmacy had not heard back from his office as of now.

## 2018-07-25 NOTE — Telephone Encounter (Signed)
It does appear another physician is prescribing.  Need to let pt and/or pharmacy know.  I do not want him to be without his medication, so let me know if this is a problem and we will refill if needed.

## 2018-07-25 NOTE — Progress Notes (Signed)
Patient ID: Kyle Richardson, male   DOB: 1927-11-03, 84 y.o.   MRN: [id number]  Chief Complaint  Patient presents with  . Follow-up    HPI Kyle Richardson is a 84 y.o. male.    The patient is seen for follow up evaluation of carotid stenosis status post right carotid stent on 05/22/2018.      PROCEDURE: 1.  Ultrasound guidance for vascular access right femoral artery 2.  Placement of a 9 x 7 x 40 exact stent with the use of the NAV-6 embolic protection device in the right internal carotid artery 3. Placement of a 10 x 30 straight exact stent in the mid right common carotid artery separate and distinct location  The patient denies interval amaurosis fugax. There is no recent history of TIA symptoms or focal motor deficits. There is no prior documented CVA.  The patient denies headache.  The patient is taking enteric-coated aspirin 81 mg daily.  The patient has a history of coronary artery disease, no recent episodes of angina or shortness of breath. The patient denies PAD or claudication symptoms. There is a history of hyperlipidemia which is being treated with a statin.    Past Medical History:  Diagnosis Date  . Abdominal fibromatosis   . 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  s/p PTCA of the mid RCA 1/96, also  s/p  CABG x 4 7/96  . Complication of anesthesia    confusion and hallucinations for several days after receiving Versed  . Degenerative disc disease   . Diabetes mellitus ([place])   . Glaucoma   . Hypercholesterolemia   . Hypertension   . Migraine headache    history of  . Myocardial infarction ([place]) 1996  . Osteoarthritis   . Sleep apnea   . Stroke [facility]) 02/22/2017   affected left side     Past Surgical History:  Procedure Laterality Date  . APPENDECTOMY    . CAROTID ANGIOGRAPHY Bilateral 04/19/2018   Procedure: CAROTID ANGIOGRAPHY;  Surgeon: Algernon Huxley, MD;  Location: [place] [facility];  Service: Cardiovascular;   Laterality: Bilateral;  . CAROTID PTA/STENT INTERVENTION Right 05/22/2018   Procedure: CAROTID PTA/STENT INTERVENTION;  Surgeon: Katha Cabal, MD;  Location: [place] [facility];  Service: Cardiovascular;  Laterality: Right;  . CATARACT EXTRACTION W/ INTRAOCULAR LENS  IMPLANT, BILATERAL    . CHOLECYSTECTOMY     Removed  . CORONARY ARTERY BYPASS GRAFT  7/96   x4  . HEMORRHOID SURGERY    . HERNIA REPAIR    . TONSILLECTOMY    . UPP and tonsillectomy  1/86      Allergies  Allergen Reactions  . Clarithromycin Diarrhea  . Codeine Other (See Comments)    Unknown  . Flomax [Tamsulosin Hcl] Other (See Comments)    Unknown  . Lamisil [Terbinafine] Other (See Comments)    Unknown  . Levofloxacin Other (See Comments)    Diplopia. NEVER put patient on levoquin!  . Sertraline Other (See Comments)    Unknown  . Sulfa Antibiotics Other (See Comments)    Unknown  . Versed [Midazolam] Other (See Comments)    Hallucinations and confusion    Current Outpatient Medications  Medication Sig Dispense Refill  . acetaminophen (TYLENOL) 500 MG tablet Take 1 tablet (500 mg total) by mouth every 6 (six) hours as needed. (Patient taking differently: Take 500 mg by mouth every 6 (six) hours as needed for moderate pain or  headache. ) 30 tablet 0  . apixaban (ELIQUIS) 5 MG TABS tablet Take 1 tablet (5 mg total) by mouth 2 (two) times daily. 30 tablet 1  . aspirin EC 81 MG EC tablet Take 1 tablet (81 mg total) by mouth daily. 180 tablet 2  . atorvastatin (LIPITOR) 40 MG tablet Take 1 tablet (40 mg total) by mouth daily. (Patient taking differently: Take 40 mg by mouth at bedtime. ) 30 tablet 1  . clopidogrel (PLAVIX) 75 MG tablet Take 1 tablet (75 mg total) by mouth daily. 30 tablet 2  . divalproex (DEPAKOTE ER) 500 MG 24 hr tablet Take 1 tablet (500 mg total) by mouth daily. (Patient taking differently: Take 500 mg by mouth at bedtime. ) 30 tablet 1  . FLUoxetine (PROZAC) 10 MG capsule Take 1 capsule  (10 mg total) by mouth daily. 30 capsule 3  . fluticasone (FLONASE) 50 MCG/ACT nasal spray SPRAY 2 SPRAYS IN EACH NOSTRIL ONCE A DAY (Patient taking differently: SPRAY 1 SPRAY IN EACH NOSTRIL ONCE A DAY AT BEDTIME) 16 g 2  . latanoprost (XALATAN) 0.005 % ophthalmic solution Place 1 drop into both eyes at bedtime.    Marland Kitchen loratadine (CLARITIN) 10 MG tablet Take 10 mg by mouth daily.    . metoprolol succinate (TOPROL-XL) 25 MG 24 hr tablet Take 0.5 tablets (12.5 mg total) by mouth daily. 30 tablet 1  . Multiple Vitamins-Minerals (PRESERVISION AREDS 2 PO) Take 1 capsule by mouth 2 (two) times daily.    . nitroGLYCERIN (NITROSTAT) 0.4 MG SL tablet Place 0.4 mg under the tongue every 5 (five) minutes as needed for chest pain.     . tizanidine (ZANAFLEX) 2 MG capsule Take 1 capsule (2 mg total) by mouth daily as needed. 30 capsule 0   No current facility-administered medications for this visit.         Physical Exam BP (!) 151/75 (BP Location: Left Arm)   Pulse 61   Resp 15   Ht 5\' 6"  (1.676 m)   Wt 163 lb (73.9 kg)   BMI 26.31 kg/m  Gen:  WD/WN, NAD Skin: incision C/D/I; neuro at baseline     Assessment/Plan: 1. Carotid stenosis, symptomatic, with infarction [facility]) Recommend:  The patient is s/p successful right carotid stent  Continue antiplatelet therapy as prescribed Continue management of CAD, HTN and Hyperlipidemia Healthy heart diet,  encouraged exercise at least 4 times per week  Follow up in 3 months with duplex ultrasound and physical exam   - VAS US CAROTID; Future     Kyle Richardson 07/25/2018, 2:16 PM   This note was created with Dragon medical transcription system.  Any errors from dictation are unintentional.

## 2018-07-25 NOTE — Telephone Encounter (Signed)
Patient wife is calling to request this medication be filled and sent  [place], [place] [phone] (Phone) [phone] (Fax)   The patient is completely out.

## 2018-07-28 DIAGNOSIS — I639 Cerebral infarction, unspecified: Secondary | ICD-10-CM | POA: Diagnosis not present

## 2018-07-29 ENCOUNTER — Other Ambulatory Visit: Payer: Self-pay | Admitting: Internal Medicine

## 2018-07-29 ENCOUNTER — Telehealth: Payer: Self-pay | Admitting: Internal Medicine

## 2018-07-29 DIAGNOSIS — R404 Transient alteration of awareness: Secondary | ICD-10-CM

## 2018-07-29 DIAGNOSIS — I639 Cerebral infarction, unspecified: Secondary | ICD-10-CM

## 2018-07-29 DIAGNOSIS — R569 Unspecified convulsions: Secondary | ICD-10-CM

## 2018-07-29 NOTE — Telephone Encounter (Signed)
Patient is being d/c from home health. Can we place referral for outpatient OT/PT at [facility]?

## 2018-07-29 NOTE — Telephone Encounter (Signed)
Copied from [place] [phone] [phone]. Topic: Referral - Request for Referral >> Jul 29, 2018 11:36 AM Gardiner Ramus wrote: Has patient seen PCP for this complaint? No Referral for which specialty:OT & PT  Preferred provider/office: [facility]  Reason for referral: Pt is getting discharged from Home health

## 2018-07-29 NOTE — Telephone Encounter (Signed)
I have placed the order for the referrals.  Someone should be contacting them with appts

## 2018-07-29 NOTE — Progress Notes (Signed)
Orders placed for referral to physical therapy and occupational therapy.

## 2018-07-30 ENCOUNTER — Other Ambulatory Visit: Payer: Self-pay

## 2018-07-30 DIAGNOSIS — R404 Transient alteration of awareness: Secondary | ICD-10-CM

## 2018-07-30 DIAGNOSIS — R569 Unspecified convulsions: Secondary | ICD-10-CM

## 2018-07-30 MED ORDER — DIVALPROEX SODIUM ER 500 MG PO TB24: 500.0000 mg | ORAL_TABLET | Freq: Every day | ORAL | 1 refills | Status: DC

## 2018-07-30 NOTE — Telephone Encounter (Signed)
Yes

## 2018-07-30 NOTE — Telephone Encounter (Signed)
I have refilled his cholesterol medication but it does not look like we have ever prescribed his depakote and he just had the zanaflex refilled on 07/12/2018. Would you like me to clarify with pharmacy about the depakote before filling?

## 2018-07-30 NOTE — Telephone Encounter (Signed)
Depakote was refilled previously at [facility]. I have sent in 30 with 1 refill per Dr. Nicki Reaper

## 2018-08-02 ENCOUNTER — Other Ambulatory Visit: Payer: Self-pay

## 2018-08-02 ENCOUNTER — Telehealth: Payer: Self-pay | Admitting: Internal Medicine

## 2018-08-02 NOTE — Telephone Encounter (Signed)
Called caregiver to confirm that patient was not out of this medication. He still has 12 pills left. Called pharmacy to make them aware.

## 2018-08-02 NOTE — Patient Outreach (Signed)
[facility]) Care Management   08/02/2018  Kyle Richardson 1927/11/06 [id number]  Kyle Richardson is an 84 y.o. male  Subjective: "I'm a little sleepy this morning". Wife states patient did not rest well last night.  Objective:   BP (!) 130/56 (BP Location: Left Arm, Patient Position: Sitting, Cuff Size: Normal)   Pulse 64   Resp 16   SpO2 95%    Physical Exam  Constitutional: He appears well-developed and well-nourished.  Cardiovascular: Normal rate, regular rhythm, normal heart sounds and intact distal pulses.  Respiratory: Effort normal and breath sounds normal. No respiratory distress.  GI: Soft. Bowel sounds are normal.  hyperactive  Genitourinary:  Genitourinary Comments: Wife states no change in patients voiding pattern  Musculoskeletal: He exhibits edema.  Decreased ROM R arm r/t past stroke Mild edema noted to right hand-patient keeps elevated most of time  Neurological: He is alert. Coordination abnormal.  Abnormal coordination with right upper extremity r/t stroke. Patient is able to state name, birthdate however he does not know year or house number of address  Skin: Skin is warm and dry.  Psychiatric: He has a normal mood and affect. His behavior is normal. Judgment normal.    Encounter Medications:   Outpatient Encounter Medications as of 08/02/2018  Medication Sig Note  . acetaminophen (TYLENOL) 500 MG tablet Take 1 tablet (500 mg total) by mouth every 6 (six) hours as needed. (Patient taking differently: Take 500 mg by mouth every 6 (six) hours as needed for moderate pain or headache. )   . apixaban (ELIQUIS) 5 MG TABS tablet Take 1 tablet (5 mg total) by mouth 2 (two) times daily.   Marland Kitchen aspirin EC 81 MG EC tablet Take 1 tablet (81 mg total) by mouth daily.   Marland Kitchen atorvastatin (LIPITOR) 40 MG tablet TAKE 1 TABLET BY MOUTH DAILY FOR CHOLESTEROL   . clopidogrel (PLAVIX) 75 MG tablet Take 1 tablet (75 mg total) by mouth daily.   . divalproex (DEPAKOTE  ER) 500 MG 24 hr tablet Take 1 tablet (500 mg total) by mouth daily.   Marland Kitchen FLUoxetine (PROZAC) 10 MG capsule Take 1 capsule (10 mg total) by mouth daily.   . fluticasone (FLONASE) 50 MCG/ACT nasal spray SPRAY 2 SPRAYS IN EACH NOSTRIL ONCE A DAY (Patient taking differently: SPRAY 1 SPRAY IN EACH NOSTRIL ONCE A DAY AT BEDTIME)   . latanoprost (XALATAN) 0.005 % ophthalmic solution Place 1 drop into both eyes at bedtime.   Marland Kitchen loratadine (CLARITIN) 10 MG tablet Take 10 mg by mouth daily.   . metoprolol succinate (TOPROL-XL) 25 MG 24 hr tablet Take 0.5 tablets (12.5 mg total) by mouth daily.   . Multiple Vitamins-Minerals (PRESERVISION AREDS 2 PO) Take 1 capsule by mouth 2 (two) times daily.   . nitroGLYCERIN (NITROSTAT) 0.4 MG SL tablet Place 0.4 mg under the tongue every 5 (five) minutes as needed for chest pain.  06/25/2018: Available if needed  . tizanidine (ZANAFLEX) 2 MG capsule Take 1 capsule (2 mg total) by mouth daily as needed.    No facility-administered encounter medications on file as of 08/02/2018.     Functional Status:   In your present state of health, do you have any difficulty performing the following activities: 06/25/2018 05/22/2018  Hearing? [place]? Y -  Difficulty concentrating or making decisions? Y -  Walking or climbing stairs? Y -  Dressing or bathing? Y -  Doing errands, shopping? Tempie Donning  Preparing Food and eating ?  Y -  Using the Toilet? Y -  In the past six months, have you accidently leaked urine? Y -  Do you have problems with loss of bowel control? Y -  Managing your Medications? Y -  Managing your Finances? Y -  Housekeeping or managing your Housekeeping? Y -  Some recent data might be hidden    Fall/Depression Screening:    Fall Risk  06/25/2018 04/24/2018 08/21/2017  Falls in the past year? No No No  Injury with Fall? - No -  Risk for fall due to : Impaired balance/gait;Impaired mobility Impaired balance/gait;Impaired mobility;Impaired vision;Other (Comment)  -  Risk for fall due to: Comment - right sided weakness from recent strokes -   PHQ 2/9 Scores 06/25/2018 04/24/2018 08/21/2017 08/21/2016 08/20/2015 12/17/2014 02/03/2013  PHQ - 2 Score 0 0 0 0 0 0 0  PHQ- 9 Score - - 0 - - - -    Assessment: [name] is napping in recliner upon entering home. Easily arousable. Recognizes RN CM although unable to recall her name. Family and private paid caregiver present during encounter.   Patient able to participate in conversation regarding his health. States he has been walking with walker around the house. Able to answer simple question about pain, sleep, ambulation, ADLs. [name] provides most information. Patient has completed [facility] services. Will start outpatient PT and OT twice weekly at [facility] next week. He is able to ambulate in home and outdoors with rolling walker. Requires wheelchair for MD appointments and social outings. Patient continues to eat, drink, and take medications without difficulty. Wife states there has been no change in medicines since last encounter. She administers medications to patient daily. Patient and wife denies recent or new falls.  Wife states patient is resting better at night with the addition of zanaflex. Feel he is not in as much shoulder discomfort or right hand discomfort since the addition.  Wife, paid caregivers, and family continue to provide transportation to and from appointments. Wife denies additional needs for resources. Special Needs adult child of home has had an increase in his respet care hours to three days a week for out of home and several hours a week in home.   Patient has met current goals. BP and CBG being checked daily by CNA and reported to caregivers if needed. A1C 6.8 <7. BP as documented above and in appropriate range post stroke/carotid stent.  Discussed additional needs of patient with wife. No other RN CM needs or education identified. Referral to health coach not appropriate at this time as patient  is not able to participate. All needs being met by paid private CNA caregivers including BP and glucose monitoring.   Plan: Case Closure   [facility] CM Care Plan Problem One     Most Recent Value  Care Plan Problem One  High Risk for readmission related to ongoing complications of stroke/stroke related symptoms  Role Documenting the Problem One  Care Management [place] for Problem One  Not Active  [facility] Long Term Goal   patient will not have readmission to hospital within the next 31 days  [facility] Long Term Goal Start Date  06/19/18  [facility] Long Term Goal Met Date  07/19/18  [facility] CM Short Term Goal #1   patient will remain free from falls over the next 30 days  [facility] CM Short Term Goal #1 Start Date  06/19/18  [facility] CM Short Term Goal #1 Met Date  07/19/18  [facility] CM  Short Term Goal #2   patient will take all medications as prescribed over the next 30 days  [facility] CM Short Term Goal #2 Start Date  06/19/18  [facility] CM Short Term Goal #2 Met Date  07/19/18  [facility] CM Short Term Goal #3  Over the next 14 days, caregiver will call 24 hour nurse line as needed as evidenced by caregiver reporting  [facility] CM Short Term Goal #3 Start Date  07/19/18  [facility] CM Short Term Goal #3 Met Date  08/02/18  Interventions for Short Tern Goal #3  patient/wife has not needed the nurse advice line but aware that it is available if needed     Kemyah Buser E. Rollene Rotunda RN, BSN [facility] Care Management Coordinator [phone]

## 2018-08-02 NOTE — Telephone Encounter (Signed)
Copied from [place] [id number]. Topic: General - Other >> Aug 02, 2018  9:51 AM Lennox Solders wrote: Reason for CRM: [place] [facility] is calling the have e-script this medication 3 times please send  tizanidine 2 mg #30 w/refills to [facility]

## 2018-08-04 ENCOUNTER — Emergency Department
Admission: EM | Admit: 2018-08-04 | Discharge: 2018-08-04 | Disposition: A | Discharge status: Home / Self Care | Payer: PPO | Attending: Emergency Medicine | Admitting: Emergency Medicine

## 2018-08-04 ENCOUNTER — Other Ambulatory Visit: Payer: Self-pay

## 2018-08-04 DIAGNOSIS — I252 Old myocardial infarction: Secondary | ICD-10-CM | POA: Diagnosis not present

## 2018-08-04 DIAGNOSIS — I480 Paroxysmal atrial fibrillation: Secondary | ICD-10-CM | POA: Diagnosis not present

## 2018-08-04 DIAGNOSIS — F0390 Unspecified dementia without behavioral disturbance: Secondary | ICD-10-CM | POA: Diagnosis not present

## 2018-08-04 DIAGNOSIS — Z87892 Personal history of anaphylaxis: Secondary | ICD-10-CM | POA: Diagnosis not present

## 2018-08-04 DIAGNOSIS — I6523 Occlusion and stenosis of bilateral carotid arteries: Secondary | ICD-10-CM | POA: Diagnosis not present

## 2018-08-04 DIAGNOSIS — N132 Hydronephrosis with renal and ureteral calculous obstruction: Secondary | ICD-10-CM | POA: Diagnosis not present

## 2018-08-04 DIAGNOSIS — Z7982 Long term (current) use of aspirin: Secondary | ICD-10-CM | POA: Diagnosis not present

## 2018-08-04 DIAGNOSIS — Z8673 Personal history of transient ischemic attack (TIA), and cerebral infarction without residual deficits: Secondary | ICD-10-CM | POA: Diagnosis not present

## 2018-08-04 DIAGNOSIS — E1122 Type 2 diabetes mellitus with diabetic chronic kidney disease: Secondary | ICD-10-CM | POA: Diagnosis not present

## 2018-08-04 DIAGNOSIS — N179 Acute kidney failure, unspecified: Secondary | ICD-10-CM | POA: Diagnosis not present

## 2018-08-04 DIAGNOSIS — Z5321 Procedure and treatment not carried out due to patient leaving prior to being seen by health care provider: Secondary | ICD-10-CM | POA: Diagnosis not present

## 2018-08-04 DIAGNOSIS — Z881 Allergy status to other antibiotic agents status: Secondary | ICD-10-CM | POA: Diagnosis not present

## 2018-08-04 DIAGNOSIS — Z9861 Coronary angioplasty status: Secondary | ICD-10-CM | POA: Diagnosis not present

## 2018-08-04 DIAGNOSIS — H53469 Homonymous bilateral field defects, unspecified side: Secondary | ICD-10-CM | POA: Diagnosis not present

## 2018-08-04 DIAGNOSIS — N1330 Unspecified hydronephrosis: Secondary | ICD-10-CM | POA: Diagnosis not present

## 2018-08-04 DIAGNOSIS — I69398 Other sequelae of cerebral infarction: Secondary | ICD-10-CM | POA: Diagnosis not present

## 2018-08-04 DIAGNOSIS — R109 Unspecified abdominal pain: Secondary | ICD-10-CM | POA: Diagnosis present

## 2018-08-04 DIAGNOSIS — N201 Calculus of ureter: Secondary | ICD-10-CM | POA: Diagnosis not present

## 2018-08-04 DIAGNOSIS — N183 Chronic kidney disease, stage 3 (moderate): Secondary | ICD-10-CM | POA: Diagnosis not present

## 2018-08-04 DIAGNOSIS — Z8679 Personal history of other diseases of the circulatory system: Secondary | ICD-10-CM | POA: Diagnosis not present

## 2018-08-04 DIAGNOSIS — N390 Urinary tract infection, site not specified: Secondary | ICD-10-CM | POA: Diagnosis not present

## 2018-08-04 DIAGNOSIS — I2510 Atherosclerotic heart disease of native coronary artery without angina pectoris: Secondary | ICD-10-CM | POA: Diagnosis not present

## 2018-08-04 DIAGNOSIS — G9349 Other encephalopathy: Secondary | ICD-10-CM | POA: Diagnosis not present

## 2018-08-04 DIAGNOSIS — Z885 Allergy status to narcotic agent status: Secondary | ICD-10-CM | POA: Diagnosis not present

## 2018-08-04 DIAGNOSIS — B999 Unspecified infectious disease: Secondary | ICD-10-CM | POA: Diagnosis not present

## 2018-08-04 DIAGNOSIS — G4730 Sleep apnea, unspecified: Secondary | ICD-10-CM | POA: Diagnosis not present

## 2018-08-04 DIAGNOSIS — Z951 Presence of aortocoronary bypass graft: Secondary | ICD-10-CM | POA: Diagnosis not present

## 2018-08-04 DIAGNOSIS — R140 Abdominal distension (gaseous): Secondary | ICD-10-CM | POA: Diagnosis not present

## 2018-08-04 DIAGNOSIS — Z9079 Acquired absence of other genital organ(s): Secondary | ICD-10-CM | POA: Diagnosis not present

## 2018-08-04 DIAGNOSIS — Z7901 Long term (current) use of anticoagulants: Secondary | ICD-10-CM | POA: Diagnosis not present

## 2018-08-04 DIAGNOSIS — I69351 Hemiplegia and hemiparesis following cerebral infarction affecting right dominant side: Secondary | ICD-10-CM | POA: Diagnosis not present

## 2018-08-04 DIAGNOSIS — E785 Hyperlipidemia, unspecified: Secondary | ICD-10-CM | POA: Diagnosis not present

## 2018-08-04 DIAGNOSIS — Z888 Allergy status to other drugs, medicaments and biological substances status: Secondary | ICD-10-CM | POA: Diagnosis not present

## 2018-08-04 DIAGNOSIS — Z882 Allergy status to sulfonamides status: Secondary | ICD-10-CM | POA: Diagnosis not present

## 2018-08-04 DIAGNOSIS — I129 Hypertensive chronic kidney disease with stage 1 through stage 4 chronic kidney disease, or unspecified chronic kidney disease: Secondary | ICD-10-CM | POA: Diagnosis not present

## 2018-08-04 DIAGNOSIS — N136 Pyonephrosis: Secondary | ICD-10-CM | POA: Diagnosis not present

## 2018-08-04 LAB — URINALYSIS, COMPLETE (UACMP) WITH MICROSCOPIC
BILIRUBIN URINE: NEGATIVE
Bacteria, UA: NONE SEEN
Glucose, UA: NEGATIVE mg/dL
HGB URINE DIPSTICK: NEGATIVE
Ketones, ur: 5 mg/dL — AB
NITRITE: NEGATIVE
PH: 6 (ref 5.0–8.0)
Protein, ur: 30 mg/dL — AB
SPECIFIC GRAVITY, URINE: 1.02 (ref 1.005–1.030)

## 2018-08-04 LAB — LIPASE, BLOOD: Lipase: 31 U/L (ref 11–51)

## 2018-08-04 LAB — COMPREHENSIVE METABOLIC PANEL
ALT: 29 U/L (ref 0–44)
AST: 23 U/L (ref 15–41)
Albumin: 3.7 g/dL (ref 3.5–5.0)
Alkaline Phosphatase: 60 U/L (ref 38–126)
Anion gap: 10 (ref 5–15)
BILIRUBIN TOTAL: 1.1 mg/dL (ref 0.3–1.2)
BUN: 20 mg/dL (ref 8–23)
CO2: 27 mmol/L (ref 22–32)
CREATININE: 1.03 mg/dL (ref 0.61–1.24)
Calcium: 9 mg/dL (ref 8.9–10.3)
Chloride: 101 mmol/L (ref 98–111)
Glucose, Bld: 165 mg/dL — ABNORMAL HIGH (ref 70–99)
Potassium: 4.5 mmol/L (ref 3.5–5.1)
Sodium: 138 mmol/L (ref 135–145)
TOTAL PROTEIN: 6.9 g/dL (ref 6.5–8.1)

## 2018-08-04 LAB — CBC
HCT: 47.1 % (ref 39.0–52.0)
Hemoglobin: 15.3 g/dL (ref 13.0–17.0)
MCH: 32 pg (ref 26.0–34.0)
MCHC: 32.5 g/dL (ref 30.0–36.0)
MCV: 98.5 fL (ref 80.0–100.0)
NRBC: 0 % (ref 0.0–0.2)
PLATELETS: 212 10*3/uL (ref 150–400)
RBC: 4.78 MIL/uL (ref 4.22–5.81)
RDW: 13.1 % (ref 11.5–15.5)
WBC: 12 10*3/uL — AB (ref 4.0–10.5)

## 2018-08-04 NOTE — ED Triage Notes (Signed)
Pt arrives to ED with lower central abd pain this AM at 7. No appendix or gallbladder. No dysuria. Some nausea. No vomiting or diarrhea. Unsure of kidney stone hx. A&O, in wheelchair. With family.

## 2018-08-04 NOTE — ED Notes (Signed)
First Nurse Note: Pt being seen for abd pain. Pt is in NAD at this time.

## 2018-08-05 ENCOUNTER — Ambulatory Visit: Payer: PPO | Admitting: Physical Therapy

## 2018-08-05 DIAGNOSIS — N1330 Unspecified hydronephrosis: Secondary | ICD-10-CM | POA: Insufficient documentation

## 2018-08-08 ENCOUNTER — Encounter: Payer: PPO | Admitting: Occupational Therapy

## 2018-08-08 ENCOUNTER — Telehealth (INDEPENDENT_AMBULATORY_CARE_PROVIDER_SITE_OTHER): Payer: Self-pay

## 2018-08-08 NOTE — Telephone Encounter (Signed)
Patient's wife called wanting to know about his medications that he was released from the hospital on ( Eliquis, Plavix and 81mg  Aspirin) and whether he should continue until his appt on 10/28/18. I explained that the Plavix and Aspirin was given due to the carotid stent placement, but his Eliquis was prescribed by another doctor so she should contact them in regards to that. Dr. Delana Meyer will discuss at the f/u visit taking the Plavix and Aspirin going forward. I discussed this with Eulogio Ditch, NP.

## 2018-08-09 MED ORDER — TIZANIDINE HCL 4 MG PO TABS
2.00 | ORAL_TABLET | ORAL | Status: DC
Start: 2018-08-09 — End: 2018-08-09

## 2018-08-09 MED ORDER — DEXTROSE 50 % IV SOLN
12.50 | INTRAVENOUS | Status: DC
Start: ? — End: 2018-08-09

## 2018-08-09 MED ORDER — FLUOXETINE HCL 10 MG PO CAPS
10.00 | ORAL_CAPSULE | ORAL | Status: DC
Start: 2018-08-11 — End: 2018-08-09

## 2018-08-09 MED ORDER — APIXABAN 5 MG PO TABS
5.00 | ORAL_TABLET | ORAL | Status: DC
Start: 2018-08-09 — End: 2018-08-09

## 2018-08-09 MED ORDER — MELATONIN 3 MG PO TBDP
3.00 | ORAL_TABLET | ORAL | Status: DC
Start: 2018-08-09 — End: 2018-08-09

## 2018-08-09 MED ORDER — FLUTICASONE PROPIONATE 50 MCG/ACT NA SUSP
1.00 | NASAL | Status: DC
Start: 2018-08-09 — End: 2018-08-09

## 2018-08-09 MED ORDER — ATORVASTATIN CALCIUM 40 MG PO TABS
40.00 | ORAL_TABLET | ORAL | Status: DC
Start: 2018-08-09 — End: 2018-08-09

## 2018-08-09 MED ORDER — GENERIC EXTERNAL MEDICATION
1.00 | Status: DC
Start: 2018-08-09 — End: 2018-08-09

## 2018-08-09 MED ORDER — DIVALPROEX SODIUM ER 500 MG PO TB24
500.00 | ORAL_TABLET | ORAL | Status: DC
Start: 2018-08-09 — End: 2018-08-09

## 2018-08-09 MED ORDER — CEFUROXIME AXETIL 500 MG PO TABS
250.00 | ORAL_TABLET | ORAL | Status: DC
Start: 2018-08-09 — End: 2018-08-09

## 2018-08-09 MED ORDER — ACETAMINOPHEN 325 MG PO TABS
975.00 | ORAL_TABLET | ORAL | Status: DC
Start: ? — End: 2018-08-09

## 2018-08-09 MED ORDER — GLUCAGON HCL RDNA (DIAGNOSTIC) 1 MG IJ SOLR
1.00 | INTRAMUSCULAR | Status: DC
Start: ? — End: 2018-08-09

## 2018-08-09 MED ORDER — NITROGLYCERIN 0.4 MG SL SUBL
0.40 | SUBLINGUAL_TABLET | SUBLINGUAL | Status: DC
Start: ? — End: 2018-08-09

## 2018-08-09 MED ORDER — INSULIN LISPRO 100 UNIT/ML ~~LOC~~ SOLN
0.00 | SUBCUTANEOUS | Status: DC
Start: 2018-08-09 — End: 2018-08-09

## 2018-08-09 MED ORDER — ASPIRIN EC 81 MG PO TBEC
81.00 | DELAYED_RELEASE_TABLET | ORAL | Status: DC
Start: 2018-08-10 — End: 2018-08-09

## 2018-08-09 MED ORDER — CLOPIDOGREL BISULFATE 75 MG PO TABS
75.00 | ORAL_TABLET | ORAL | Status: DC
Start: 2018-08-10 — End: 2018-08-09

## 2018-08-09 MED ORDER — LIDOCAINE HCL 1 % IJ SOLN
0.50 | INTRAMUSCULAR | Status: DC
Start: ? — End: 2018-08-09

## 2018-08-09 MED ORDER — GENERIC EXTERNAL MEDICATION
4.00 | Status: DC
Start: ? — End: 2018-08-09

## 2018-08-11 DIAGNOSIS — I2510 Atherosclerotic heart disease of native coronary artery without angina pectoris: Secondary | ICD-10-CM | POA: Diagnosis not present

## 2018-08-11 DIAGNOSIS — Z8673 Personal history of transient ischemic attack (TIA), and cerebral infarction without residual deficits: Secondary | ICD-10-CM | POA: Diagnosis not present

## 2018-08-11 DIAGNOSIS — N390 Urinary tract infection, site not specified: Secondary | ICD-10-CM | POA: Diagnosis not present

## 2018-08-11 DIAGNOSIS — E785 Hyperlipidemia, unspecified: Secondary | ICD-10-CM | POA: Diagnosis not present

## 2018-08-11 DIAGNOSIS — Z960 Presence of urogenital implants: Secondary | ICD-10-CM | POA: Diagnosis not present

## 2018-08-11 DIAGNOSIS — N201 Calculus of ureter: Secondary | ICD-10-CM | POA: Diagnosis not present

## 2018-08-11 DIAGNOSIS — Z7902 Long term (current) use of antithrombotics/antiplatelets: Secondary | ICD-10-CM | POA: Diagnosis not present

## 2018-08-11 DIAGNOSIS — E119 Type 2 diabetes mellitus without complications: Secondary | ICD-10-CM | POA: Diagnosis not present

## 2018-08-11 DIAGNOSIS — Z7982 Long term (current) use of aspirin: Secondary | ICD-10-CM | POA: Diagnosis not present

## 2018-08-11 DIAGNOSIS — Z951 Presence of aortocoronary bypass graft: Secondary | ICD-10-CM | POA: Diagnosis not present

## 2018-08-11 DIAGNOSIS — I4891 Unspecified atrial fibrillation: Secondary | ICD-10-CM | POA: Diagnosis not present

## 2018-08-11 DIAGNOSIS — I10 Essential (primary) hypertension: Secondary | ICD-10-CM | POA: Diagnosis not present

## 2018-08-12 ENCOUNTER — Telehealth: Payer: Self-pay | Admitting: Internal Medicine

## 2018-08-12 ENCOUNTER — Other Ambulatory Visit: Payer: Self-pay

## 2018-08-12 ENCOUNTER — Other Ambulatory Visit: Payer: Self-pay | Admitting: Pharmacist

## 2018-08-12 ENCOUNTER — Telehealth (INDEPENDENT_AMBULATORY_CARE_PROVIDER_SITE_OTHER): Payer: Self-pay

## 2018-08-12 MED ORDER — TIZANIDINE HCL 2 MG PO CAPS: 2.0000 mg | ORAL_CAPSULE | Freq: Every day | ORAL | 0 refills | Status: DC | PRN

## 2018-08-12 NOTE — Telephone Encounter (Signed)
rx sent in 

## 2018-08-12 NOTE — Telephone Encounter (Signed)
Copied from [place] [id number]. Topic: Quick Communication - Rx Refill/Question >> Aug 12, 2018  3:30 PM Selinda Flavin B, [place] wrote: **Patient's wife calling and is requesting that the nystatin powder come in a bigger box. States that the 30mg  is not enough.-- Would like to know if this could be sent in today due to their not driving any longer. Advised that since this was a refill, it could take up to 72 hours for refills to be sent to the pharmacy. **  Medication: tizanidine (ZANAFLEX) 2 MG capsule  Nystatin powder  Has the patient contacted their pharmacy? Yes.   (Agent: If no, request that the patient contact the pharmacy for the refill.) (Agent: If yes, when and what did the pharmacy advise?)  Preferred Pharmacy (with phone number or street name): [place], [place] - [address]  Agent: Please be advised that RX refills may take up to 3 business days. We ask that you follow-up with your pharmacy.

## 2018-08-12 NOTE — Patient Outreach (Signed)
[facility]) Care Management  [place]   08/12/2018  Kyle Richardson [birth year] [id number]  84 year old male referred to [place] for 30 day post discharge medication review.  PMHx includes, but not limited to, CAD s/p CABG 1996; hx CVA x2 s/p carotid stent placement 05/22/2018, atrial fibrillation, T2DM, HTN, HLD.  Patient was admitted to [facility] 10/27-11/10/2017 for left uretal stone w/ hydroureteronephrosis. Uretal stent placed on 10/28. Patient to complete 14 day course of cefuroxime, and f/u with urology on 09/10/2018 for stent removal and stone retrieval.   Spoke with his wife, HIPAA verifiers identified. We attempted to review patient allergies, but she notes that she cannot remember what reactions occurred. She notes their daughter is a Software engineer and helps out with their medical care. She notes that her husband has not yet passed the uretal stone and that he is very uncomfortable.   Telephone note from 08/08/2018 where the patient's wife called Vascular surgeon Delana Meyer) regarding duration of triple therapy with Eliquis, clopidogrel, and aspirin. Per chart review, the discharge summary from 05/22/2018 after carotid stent placement recommended 3 months of treatment with triple therapy. Duration of therapy was not mentioned in the 07/25/2018 office visit note with Dr. Delana Meyer.   Additionally, she notes that her husband has stopped taking fluoxetine. This has been removed from the medication list.   Objective: Lab Results  Component Value Date   CREATININE 1.03 08/04/2018   CREATININE 0.69 05/26/2018   CREATININE 0.82 05/23/2018  CrCl ~ 48 mL/min, eGFR >60  Lab Results  Component Value Date   HGBA1C 6.8 (H) 04/16/2018    Lipid Panel     Component Value Date/Time   CHOL 159 04/16/2018 0558   CHOL 129 08/13/2013 1026   TRIG 288 (H) 04/16/2018 0558   TRIG 166 08/13/2013 1026   HDL 35 (L) 04/16/2018 0558   HDL 47  08/13/2013 1026   CHOLHDL 4.5 04/16/2018 0558   VLDL 58 (H) 04/16/2018 0558   VLDL 33 08/13/2013 1026   LDLCALC 66 04/16/2018 0558   LDLCALC 49 08/13/2013 1026   LDLDIRECT 81.0 10/12/2017 0928    BP Readings from Last 3 Encounters:  08/04/18 (!) 185/75  08/02/18 (!) 130/56  07/25/18 (!) 151/75    Allergies  Allergen Reactions  . Flomax [Tamsulosin Hcl] Other (See Comments)    Unknown  . Lamisil [Terbinafine] Other (See Comments)    Unknown  . Levofloxacin Other (See Comments)    Diplopia. NEVER put patient on levoquin!  . Sertraline Other (See Comments)    Unknown  . Sulfa Antibiotics Other (See Comments)    Unknown  . Versed [Midazolam] Other (See Comments)    Hallucinations and confusion  . Clarithromycin Diarrhea  . Codeine Other (See Comments)    Unknown    Medications Reviewed Today    Reviewed by De Hollingshead, [facility] (Pharmacist) on 08/12/18 at 1138  Med List Status: <None>  Medication Order Taking? Sig Documenting Provider Last Dose Status Informant  acetaminophen (TYLENOL) 500 MG tablet [id number] No Take 1 tablet (500 mg total) by mouth every 6 (six) hours as needed.  Patient not taking:  Reported on 08/12/2018   Cathlyn Parsons, PA-C Not Taking Active Spouse/Significant Other  apixaban (ELIQUIS) 5 MG TABS tablet [id number] Yes Take 1 tablet (5 mg total) by mouth 2 (two) times daily. Cathlyn Parsons, PA-C Taking Active Spouse/Significant Other  aspirin EC 81 MG EC tablet [id number] Yes Take 1 tablet (  81 mg total) by mouth daily. Schnier, Dolores Lory, MD Taking Active   atorvastatin (LIPITOR) 40 MG tablet [id number] Yes TAKE 1 TABLET BY MOUTH DAILY FOR CHOLESTEROL Einar Pheasant, MD Taking Active   cefUROXime (CEFTIN) 250 MG tablet [id number] Yes Take by mouth. [provider] Taking Active   clopidogrel (PLAVIX) 75 MG tablet [id number] Yes Take 1 tablet (75 mg total) by mouth daily. Schnier, Dolores Lory, MD Taking Active   divalproex (DEPAKOTE ER) 500 MG  24 hr tablet [id number] Yes Take 1 tablet (500 mg total) by mouth daily. Einar Pheasant, MD Taking Active   fluticasone [facility]) 50 MCG/ACT nasal spray [id number] Yes SPRAY 2 SPRAYS IN EACH NOSTRIL ONCE A DAY  Patient taking differently:  SPRAY 1 SPRAY IN EACH NOSTRIL ONCE A DAY AT BEDTIME   Einar Pheasant, MD Taking Active Spouse/Significant Other  latanoprost (XALATAN) 0.005 % ophthalmic solution [id number] Yes Place 1 drop into both eyes at bedtime. [provider] Taking Active Spouse/Significant Other  loratadine (CLARITIN) 10 MG tablet [id number] Yes Take 10 mg by mouth daily. [provider] Taking Active Spouse/Significant Other  metoprolol succinate (TOPROL-XL) 25 MG 24 hr tablet [id number] Yes Take 0.5 tablets (12.5 mg total) by mouth daily. Saundra Shelling, MD Taking Active Spouse/Significant Other           Med Note Broadus John May 14, 2018 11:51 AM)    Multiple Vitamins-Minerals (PRESERVISION AREDS 2 PO) [id number] Yes Take 1 capsule by mouth 2 (two) times daily. [provider] Taking Active Spouse/Significant Other  nitroGLYCERIN (NITROSTAT) 0.4 MG SL tablet [id number] No Place 0.4 mg under the tongue every 5 (five) minutes as needed for chest pain.  [provider] Not Taking Active Spouse/Significant Other           Med Note Rollene Rotunda, Clois Dupes   Tue Jun 25, 2018 12:34 PM) Available if needed  tizanidine (ZANAFLEX) 2 MG capsule [id number] Yes Take 1 capsule (2 mg total) by mouth daily as needed. Einar Pheasant, MD Taking Active           Assessment: Date Discharged from Hospital: 08/09/2018 Date Medication Reconciliation Performed: 08/12/2018  New Medications at Discharge:   Cefuroxime x 10 days  Patient was recently discharged from hospital and all medications have been reviewed  Drugs sorted by system:  Neurologic/Psychologic: divalproex  Cardiovascular: aspirin, Eliquis, atorvastatin, clopidogrel, metoprolol,  NTG  Pulmonary/Allergy: fluticasone intranasal, loratidine  Topical: latanoprost ophthalmic  Pain: acetaminophen, tizanidine  Infectious Diseases: cefuroxime (to complete 10 day course on 11/11)  Vitamins/Minerals/Supplements: melatonin, Preservision MVI  Medication Review Findings:  . Discharge summary from carotid stent placement hospitalization (05/22/2018) notes a 3 month duration of triple therapy (Eliquis, aspirin, and clopidogrel). Duration of therapy is not mentioned in 07/25/2018 office visit with Dr. Ronalee Belts  Plan: - Will contact Dr. Nino Parsley office for clarification regarding intended duration of triple anticoag/antiplatelet therapy. - Will route note to PCP Dr. Nicki Reaper for [facility] - Patient's wife has my contact information and can reach out with any further questions or concerns.   Catie Darnelle Maffucci, PharmD PGY2 Ambulatory Care Pharmacy Resident, [place] [facility] Phone: [phone]

## 2018-08-12 NOTE — Telephone Encounter (Signed)
Yes he remains on all three until December 14

## 2018-08-12 NOTE — Telephone Encounter (Signed)
Pharmacist called to get clarification of this patient's medication regimen: Is the patient to only do the Eliquis, Plavix and Asprin together for three months and then discontinue the Plavix at the end of the three month period after his procedure?   The patient was recently seen in the office on 10/17 and the explanation of which medications to remain on, did not happen according to the patient.  Which medication does he need to continue at this point?

## 2018-08-13 ENCOUNTER — Ambulatory Visit: Payer: PPO | Admitting: Physical Therapy

## 2018-08-13 ENCOUNTER — Other Ambulatory Visit: Payer: Self-pay | Admitting: Pharmacist

## 2018-08-13 ENCOUNTER — Telehealth: Payer: Self-pay | Admitting: Internal Medicine

## 2018-08-13 ENCOUNTER — Telehealth: Payer: Self-pay

## 2018-08-13 ENCOUNTER — Other Ambulatory Visit: Payer: Self-pay

## 2018-08-13 ENCOUNTER — Encounter: Payer: PPO | Admitting: Occupational Therapy

## 2018-08-13 NOTE — Telephone Encounter (Signed)
Copied from [place] [id number]. Topic: Quick Communication - Home Health Verbal Orders >> Aug 13, 2018  8:55 AM Jodie Echevaria wrote: Caller/Agency: [facility] Main Number: [phone]  ok to leave message if no answer Requesting OT/PT/Skilled Nursing/Social Work:  Skilled Nursing Frequency: 2xs week 1 week, 1x week for 3 weeks

## 2018-08-13 NOTE — Patient Outreach (Signed)
[facility]) Care Management  08/13/2018  Kyle Richardson 11/15/1927 [id number]  Received call back from [place] at [facility] (see phone note dated 08/12/2018). Per Dr. Ronalee Belts, patient is to continue triple therapy with Eliquis, aspirin, and clopidogrel until 09/21/2018, at which time, he will discontinue the clopidogrel.   Contacted patient's wife, [name], and told her this information. She expressed understanding. Patient has follow up scheduled with PCP Dr. Nicki Reaper on 09/10/2018; this information can be reviewed at that time. Will route note to Dr. Nicki Reaper for [facility].   Patient's wife has my contact information and can reach out with any future questions or concerns.   Catie Darnelle Maffucci, PharmD PGY2 Ambulatory Care Pharmacy Resident, [place] [facility] Phone: [phone]

## 2018-08-13 NOTE — Telephone Encounter (Signed)
Left message on Amy's voicemail. OK to do skilled nursing.

## 2018-08-13 NOTE — Telephone Encounter (Signed)
Called Katie back to give her the clarification on the duration of this patient's medication

## 2018-08-13 NOTE — Patient Outreach (Signed)
[facility]) Care Management  08/13/2018  Dare Sanger 1928/03/13 [id number]  Transition of care  Referral date: 11.5.19 Referral source: discharged from an inpatient admission from [facility] on 08/09/18. Insurance: Health team advantage  Transition of care will be completed by patient's primary care provider office who will refer to [place] [facility] if needed.   PLAN: RNCM will close patient due to patient being enrolled in an external program.   Quinn Plowman RN,BSN,CCM [facility] Telephonic  [phone]

## 2018-08-13 NOTE — Telephone Encounter (Signed)
Copied from [place] [id number]. Topic: Appointment Scheduling - Scheduling Inquiry for Clinic >> Aug 13, 2018 11:35 AM Lennox Solders wrote: Reason for CRM:pt was discharge from [place] hospital on 08-09-18. Pt wife is requesting for her husband to see dr Nicki Reaper.

## 2018-08-14 ENCOUNTER — Telehealth: Payer: Self-pay | Admitting: Internal Medicine

## 2018-08-14 NOTE — Telephone Encounter (Signed)
Ok to give verbal orders?

## 2018-08-14 NOTE — Telephone Encounter (Signed)
Patient is doing better since hospitalized. Patient has not passed stone yet but is doing better. Pt has been scheduled with you on Fri at 3:30

## 2018-08-14 NOTE — Telephone Encounter (Signed)
Copied from [place] [phone]. Topic: Quick Communication - Home Health Verbal Orders >> Aug 14, 2018  3:52 PM Leward Quan A wrote: Caller/Agency: Joya Gaskins Callback Number: [phone] Requesting OT/PT/Skilled Nursing/Social Work: PT Frequency: 2 wk 1 and 1 wk 1

## 2018-08-14 NOTE — Telephone Encounter (Signed)
DC 08/09/2018 from [facility] to late for TCM  Family requesting appointment.

## 2018-08-14 NOTE — Telephone Encounter (Signed)
Please call and see how pt is doing.  I do not mind seeing him for hospital follow up, but want to know how he is doing to better know where to schedule.  (?3:30 on Friday).

## 2018-08-15 ENCOUNTER — Encounter: Payer: PPO | Admitting: Occupational Therapy

## 2018-08-15 NOTE — Telephone Encounter (Signed)
PT. Twice a week for 2 weeks and then once a week for 1 week. Verbals given

## 2018-08-16 ENCOUNTER — Ambulatory Visit (INDEPENDENT_AMBULATORY_CARE_PROVIDER_SITE_OTHER): Payer: PPO | Admitting: Internal Medicine

## 2018-08-16 ENCOUNTER — Encounter: Payer: Self-pay | Admitting: Internal Medicine

## 2018-08-16 DIAGNOSIS — E7800 Pure hypercholesterolemia, unspecified: Secondary | ICD-10-CM | POA: Diagnosis not present

## 2018-08-16 DIAGNOSIS — I10 Essential (primary) hypertension: Secondary | ICD-10-CM

## 2018-08-16 DIAGNOSIS — I2510 Atherosclerotic heart disease of native coronary artery without angina pectoris: Secondary | ICD-10-CM | POA: Diagnosis not present

## 2018-08-16 DIAGNOSIS — E119 Type 2 diabetes mellitus without complications: Secondary | ICD-10-CM | POA: Diagnosis not present

## 2018-08-16 DIAGNOSIS — N201 Calculus of ureter: Secondary | ICD-10-CM | POA: Diagnosis not present

## 2018-08-16 DIAGNOSIS — I480 Paroxysmal atrial fibrillation: Secondary | ICD-10-CM | POA: Diagnosis not present

## 2018-08-16 NOTE — Progress Notes (Signed)
Patient ID: Kyle Richardson, male   DOB: 1928/02/15, 84 y.o.   MRN: [id number]   Subjective:    Patient ID: Kyle Richardson, male    DOB: [birth year], 84 y.o.   MRN: [id number]  HPI  Patient here for hospital follow up.  He is accompanied by his wife and caretaker.  History obtained from all of them.  Admitted 08/04/18 - 08/09/18 for abdominal distension and kidney stone.  Found to have left distal obstructing ureteral stone with hydroureteronephrosis.  Stent placed 08/05/18.  On abx for infection, etc.  Has f/u planned with urology 09/10/18.  He is eating.  Still with some discomfort, but has improved.  Overall family and caretaker feel he is doing better.  Getting around better.  More interactive.  Still with limitations, but has improved.  Planning to work more with PT.  No nausea or vomiting.  Bowels moving.     Past Medical History:  Diagnosis Date  . Abdominal fibromatosis   . 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  s/p PTCA of the mid RCA 1/96, also  s/p  CABG x 4 7/96  . Complication of anesthesia    confusion and hallucinations for several days after receiving Versed  . Degenerative disc disease   . Diabetes mellitus ([place])   . Glaucoma   . Hypercholesterolemia   . Hypertension   . Migraine headache    history of  . Myocardial infarction ([place]) 1996  . Osteoarthritis   . Sleep apnea   . Stroke [facility]) 02/22/2017   affected left side    Past Surgical History:  Procedure Laterality Date  . APPENDECTOMY    . CAROTID ANGIOGRAPHY Bilateral 04/19/2018   Procedure: CAROTID ANGIOGRAPHY;  Surgeon: Algernon Huxley, MD;  Location: [place] [facility];  Service: Cardiovascular;  Laterality: Bilateral;  . CAROTID PTA/STENT INTERVENTION Right 05/22/2018   Procedure: CAROTID PTA/STENT INTERVENTION;  Surgeon: Katha Cabal, MD;  Location: [place] [facility];  Service: Cardiovascular;  Laterality: Right;  . CATARACT EXTRACTION W/ INTRAOCULAR LENS  IMPLANT,  BILATERAL    . CHOLECYSTECTOMY     Removed  . CORONARY ARTERY BYPASS GRAFT  7/96   x4  . HEMORRHOID SURGERY    . HERNIA REPAIR    . TONSILLECTOMY    . UPP and tonsillectomy  1/86   Family History  Problem Relation Age of Onset  . Heart disease Mother        died age 29  . Heart disease Father        and other vascular disease  . Diabetes Unknown        four siblings  . Heart disease Brother        s/p CABG  . Colon cancer Neg Hx   . Prostate cancer Neg Hx    Social History   Socioeconomic History  . Marital status: Married    Spouse name: [name]  . Number of children: 2  . Years of education: come college  . Highest education level: Associate degree: occupational, Hotel manager, or vocational program  Occupational History  . Occupation: retired    Fish farm manager: [employer]  Social Needs  . Financial resource strain: Not hard at all  . Food insecurity:    Worry: Never true    Inability: Never true  . Transportation needs:    Medical: No    Non-medical: No  Tobacco Use  . Smoking status: Never Smoker  . Smokeless tobacco: Never Used  Substance and Sexual Activity  . Alcohol use: No    Alcohol/week: 0.0 standard drinks  . Drug use: No  . Sexual activity: Not Currently  Lifestyle  . Physical activity:    Days per week: 0 days    Minutes per session: 0 min  . Stress: Not at all  Relationships  . Social connections:    Talks on phone: Never    Gets together: Three times a week    Attends religious service: More than 4 times per year    Active member of club or organization: No    Attends meetings of clubs or organizations: Never    Relationship status: Married  Other Topics Concern  . Not on file  Social History Narrative  . Not on file   Outpatient Encounter Medications as of 08/16/2018  Medication Sig  . acetaminophen (TYLENOL) 500 MG tablet Take 1 tablet (500 mg total) by mouth every 6 (six) hours as needed. (Patient not taking: Reported on 08/12/2018)  . apixaban  (ELIQUIS) 5 MG TABS tablet Take 1 tablet (5 mg total) by mouth 2 (two) times daily.  Marland Kitchen aspirin EC 81 MG EC tablet Take 1 tablet (81 mg total) by mouth daily.  Marland Kitchen atorvastatin (LIPITOR) 40 MG tablet TAKE 1 TABLET BY MOUTH DAILY FOR CHOLESTEROL  . [EXPIRED] cefUROXime (CEFTIN) 250 MG tablet Take by mouth.  . divalproex (DEPAKOTE ER) 500 MG 24 hr tablet Take 1 tablet (500 mg total) by mouth daily.  . fluticasone (FLONASE) 50 MCG/ACT nasal spray SPRAY 2 SPRAYS IN EACH NOSTRIL ONCE A DAY (Patient taking differently: SPRAY 1 SPRAY IN EACH NOSTRIL ONCE A DAY AT BEDTIME)  . latanoprost (XALATAN) 0.005 % ophthalmic solution Place 1 drop into both eyes at bedtime.  Marland Kitchen loratadine (CLARITIN) 10 MG tablet Take 10 mg by mouth daily.  Marland Kitchen MELATONIN ER PO Take 1 tablet by mouth daily.  . metoprolol succinate (TOPROL-XL) 25 MG 24 hr tablet Take 0.5 tablets (12.5 mg total) by mouth daily.  . Multiple Vitamins-Minerals (PRESERVISION AREDS 2 PO) Take 1 capsule by mouth 2 (two) times daily.  . nitroGLYCERIN (NITROSTAT) 0.4 MG SL tablet Place 0.4 mg under the tongue every 5 (five) minutes as needed for chest pain.   . tizanidine (ZANAFLEX) 2 MG capsule Take 1 capsule (2 mg total) by mouth daily as needed.  . [DISCONTINUED] clopidogrel (PLAVIX) 75 MG tablet Take 1 tablet (75 mg total) by mouth daily.   No facility-administered encounter medications on file as of 08/16/2018.     Review of Systems  Constitutional: Negative for appetite change and fever.  HENT: Negative for congestion and sinus pressure.   Respiratory: Negative for cough, chest tightness and shortness of breath.   Cardiovascular: Negative for chest pain, palpitations and leg swelling.  Gastrointestinal: Negative for diarrhea, nausea and vomiting.       Decreased abdominal pain.    Genitourinary:       Recent stent placement.  Decreased pain.    Musculoskeletal: Negative for joint swelling and myalgias.  Skin: Negative for color change and rash.    Neurological: Negative for dizziness, light-headedness and headaches.  Psychiatric/Behavioral: Negative for agitation and dysphoric mood.       Objective:    Physical Exam  Constitutional: He appears well-developed and well-nourished. No distress.  HENT:  Nose: Nose normal.  Mouth/Throat: Oropharynx is clear and moist.  Neck: Neck supple.  Cardiovascular: Normal rate and regular rhythm.  Pulmonary/Chest: Effort normal and breath sounds normal. No  respiratory distress.  Abdominal: Soft. Bowel sounds are normal. There is no tenderness.  Musculoskeletal: He exhibits no edema or tenderness.  Lymphadenopathy:    He has no cervical adenopathy.  Skin: No rash noted. No erythema.  Psychiatric: He has a normal mood and affect. His behavior is normal.    BP 134/72 (BP Location: Left Arm, Patient Position: Sitting, Cuff Size: Normal)   Pulse 70   Temp 97.8 F (36.6 C) (Oral)   Resp 18   SpO2 98%  Wt Readings from Last 3 Encounters:  08/04/18 165 lb (74.8 kg)  07/25/18 163 lb (73.9 kg)  07/10/18 163 lb 9.6 oz (74.2 kg)     Lab Results  Component Value Date   WBC 12.0 (H) 08/04/2018   HGB 15.3 08/04/2018   HCT 47.1 08/04/2018   PLT 212 08/04/2018   GLUCOSE 165 (H) 08/04/2018   CHOL 159 04/16/2018   TRIG 288 (H) 04/16/2018   HDL 35 (L) 04/16/2018   LDLDIRECT 81.0 10/12/2017   LDLCALC 66 04/16/2018   ALT 29 08/04/2018   AST 23 08/04/2018   NA 138 08/04/2018   K 4.5 08/04/2018   CL 101 08/04/2018   CREATININE 1.03 08/04/2018   BUN 20 08/04/2018   CO2 27 08/04/2018   TSH 2.098 03/29/2018   PSA 0.66 05/05/2016   INR 1.00 04/15/2018   HGBA1C 6.8 (H) 04/16/2018   MICROALBUR 2.2 (H) 10/12/2017       Assessment & Plan:   Problem List Items Addressed This Visit    CAD (coronary artery disease)    Followed by cardiology.  Stable.  Continue risk factor modification.        Diabetes mellitus type 2 in nonobese (HCC)    Low carb diet and exercise as tolerated.  Follow  met b and a1c.        Hypercholesterolemia    On lipitor.  Low cholesterol diet and exercise.  Follow lipid panel and liver function tests.        Hypertension    Blood pressure under good control.  Continue same medication regimen.  Follow pressures.  Follow metabolic panel.        PAF (paroxysmal atrial fibrillation) ([place])    Followed by cardiology.  On eliquis.        Ureteral stone    Recent admission.  Found to have left distal obstructing ureteral stone with hydroureteronephrosis.  S/p stent placement.  Pain is better. On abx. Urinating.  Has f/u planned with urology 12.3.19.            Einar Pheasant, MD

## 2018-08-19 ENCOUNTER — Encounter: Payer: PPO | Admitting: Occupational Therapy

## 2018-08-21 ENCOUNTER — Ambulatory Visit: Payer: PPO | Admitting: Internal Medicine

## 2018-08-21 ENCOUNTER — Encounter: Payer: PPO | Admitting: Occupational Therapy

## 2018-08-21 ENCOUNTER — Ambulatory Visit: Payer: PPO

## 2018-08-22 ENCOUNTER — Other Ambulatory Visit (INDEPENDENT_AMBULATORY_CARE_PROVIDER_SITE_OTHER): Payer: Self-pay | Admitting: Vascular Surgery

## 2018-08-25 DIAGNOSIS — N201 Calculus of ureter: Secondary | ICD-10-CM | POA: Insufficient documentation

## 2018-08-25 NOTE — Assessment & Plan Note (Signed)
Blood pressure under good control.  Continue same medication regimen.  Follow pressures.  Follow metabolic panel.   

## 2018-08-25 NOTE — Assessment & Plan Note (Signed)
Followed by cardiology.  On eliquis.   

## 2018-08-25 NOTE — Assessment & Plan Note (Signed)
Low carb diet and exercise as tolerated.  Follow met b and a1c.   

## 2018-08-25 NOTE — Assessment & Plan Note (Signed)
On lipitor.  Low cholesterol diet and exercise.  Follow lipid panel and liver function tests.   

## 2018-08-25 NOTE — Assessment & Plan Note (Signed)
Followed by cardiology.  Stable.  Continue risk factor modification.   

## 2018-08-25 NOTE — Assessment & Plan Note (Signed)
Recent admission.  Found to have left distal obstructing ureteral stone with hydroureteronephrosis.  S/p stent placement.  Pain is better. On abx. Urinating.  Has f/u planned with urology 12.3.19.

## 2018-08-26 ENCOUNTER — Telehealth: Payer: Self-pay | Admitting: Internal Medicine

## 2018-08-26 NOTE — Telephone Encounter (Signed)
Copied from [place] [id number]. Topic: General - Other >> Aug 26, 2018 12:29 PM Leward Quan A wrote: Reason for CRM: Abby Nurse with [facility] called to say that patient will be discharged from their care on 09/04/18. States that patient was diagnosed with stones but as of today he has not passed any but also there is no complaint of pain but urine is still dark. Wife is very concerned and [facility] would like to know if there is something else that they need to do before discharging patient. Please advise Ph# [phone] ok to LVM

## 2018-08-27 ENCOUNTER — Ambulatory Visit: Payer: PPO | Admitting: Physical Therapy

## 2018-08-27 NOTE — Telephone Encounter (Signed)
Left message for [facility] to call back

## 2018-08-27 NOTE — Telephone Encounter (Signed)
See request. Thanks. 

## 2018-08-27 NOTE — Telephone Encounter (Signed)
Sent to PCP please advised if any recommendations can be given.   Thanks

## 2018-08-27 NOTE — Telephone Encounter (Signed)
Recommend staying hydrated.  I reviewed the discharge summary and they stated that he would probably continue to have blood in urine due to indwelling stent and not passing stone.  Having no pain.  If persistent problems, can be reevaluated.  Also can check urine and metabolic panel to confirm kidney function ok.

## 2018-08-28 DIAGNOSIS — I639 Cerebral infarction, unspecified: Secondary | ICD-10-CM | POA: Diagnosis not present

## 2018-08-28 NOTE — Telephone Encounter (Signed)
I have given [facility] recommendations by Dr. Nicki Reaper. When I spoke with [facility] & they stated that they are about to discharge patient, which she feels is appropriate. Patient's wife is still concerned because he has still not passed kidney stone. He has appointment 10/17/18, but they were wanting to make sure he shouldn't f/u sooner?

## 2018-08-28 NOTE — Telephone Encounter (Signed)
If concern about not passing the stone, needs f/u with urology.  I do not mind seeing him earlier than 10/17/18, but if related to passing the stone - needs f/u with urology.

## 2018-08-28 NOTE — Telephone Encounter (Signed)
Please see my note from today 11/20. Thanks:)

## 2018-08-29 ENCOUNTER — Telehealth: Payer: Self-pay | Admitting: Internal Medicine

## 2018-08-29 ENCOUNTER — Telehealth: Payer: Self-pay

## 2018-08-29 ENCOUNTER — Ambulatory Visit: Payer: PPO | Admitting: Physical Therapy

## 2018-08-29 NOTE — Telephone Encounter (Signed)
Copied from [place] [phone]. Topic: Quick Communication - Home Health Verbal Orders >> Aug 29, 2018 10:50 AM Cecelia Byars, NT wrote: Caller/AgencyDebroah Loop  from [place] Number: [phone]    Requesting a referral for outpatient  OT and PT  at [facility]  per the patients request

## 2018-08-29 NOTE — Telephone Encounter (Signed)
Copied from [place] [id number]. Topic: Quick Communication - Home Health Verbal Orders >> Aug 29, 2018 10:50 AM Cecelia Byars, NT wrote: Caller/AgencyDebroah Loop  from [place] Number: [phone]    Requesting a referral for outpatient  OT and PT  at [facility]  per the patients request

## 2018-08-30 NOTE — Telephone Encounter (Signed)
LMTCB

## 2018-08-30 NOTE — Telephone Encounter (Signed)
Left message for patient to call back. Need to know if they are agreeable to referral

## 2018-09-03 ENCOUNTER — Ambulatory Visit: Payer: PPO | Admitting: Physical Therapy

## 2018-09-03 ENCOUNTER — Encounter: Payer: PPO | Admitting: Occupational Therapy

## 2018-09-03 NOTE — Telephone Encounter (Signed)
Patient's wife, [name], is returning call to [place].  Please call back at [phone] or [phone]

## 2018-09-03 NOTE — Telephone Encounter (Signed)
Talked with patients wife. She stated that the patient was confused more than normal yesterday and was not able to control his right side as he has been doing. The home health nurse was out and advised them to let you know. Confirmed with wife that he is not having any acute issues at this time. He woke up this morning and is doing really well and has not been confused. Home health nurse advised that he could be dehydrated. Advised patients wife that I would let you know about this be sure he is still okay to wait until Jan to be seen but also advised if patient developed any acute symptoms he needs to be evaluated by urgent care or ED due to our office being closed for the holidays Thursday and Friday

## 2018-09-03 NOTE — Telephone Encounter (Signed)
See other phone note

## 2018-09-03 NOTE — Telephone Encounter (Signed)
I am ok for him to be schedule an earlier appt, but if problems with right side and more confused - this is more of an acute issue and would need to be evaluated urgently.

## 2018-09-03 NOTE — Telephone Encounter (Signed)
I have left a VM with Jatina from [facility] for the PT as well as OT for patient. After speaking with patient's wife she stated that she wanted him to have both. Patient's wife is very concerned because for the last two days patient has been very confused & not making sense.Marland Kitchen Also his right side she stated "has a mind of it's own". Patient can not seem to control it & move his arm comfortably as he wants to. They really want an appointment before 10/17/18. Please advise on an earlier appointment.

## 2018-09-09 ENCOUNTER — Ambulatory Visit: Payer: Self-pay

## 2018-09-09 ENCOUNTER — Other Ambulatory Visit: Payer: Self-pay | Admitting: Internal Medicine

## 2018-09-09 NOTE — Telephone Encounter (Signed)
Patients wife is wanting to increase dose of his zanaflex because his arm is having spasms and keeping him awake at night

## 2018-09-09 NOTE — Telephone Encounter (Signed)
He is currently on 2mg  q hs.  Can try 4mg  q hs.  If tolerates, can send in rx for 4mg  tablets.  Will need to let us know.

## 2018-09-09 NOTE — Telephone Encounter (Signed)
See message.  Pt to try 2 (2mg ) tablets q hs.  Will call back to let us know if needs 4mg  or 2mg  tablets.  Hold on refill until hear from them.

## 2018-09-09 NOTE — Telephone Encounter (Signed)
Advised patients wife to increase q hs and let us know so we can send in new prescription

## 2018-09-09 NOTE — Telephone Encounter (Signed)
Right arm "jerking" or twitching. Per wife pt is having more episodes. Wife stated the jerking is interfering with his sleep. Wife is asking if Dr Nicki Reaper would consider increasing the Zanaflex dosage.  Reason for Disposition . Caller has NON-URGENT medication question about med that PCP prescribed and triager unable to answer question  Answer Assessment - Initial Assessment Questions 1. SYMPTOMS: "Do you have any symptoms?"     Right arm "jerking" or twitching. Per wife pt is having more episodes. Wife stated the jerking is interfering with his sleep. Wife is asking if Dr Nicki Reaper would consider increasing the Zanaflex dosage.  2. SEVERITY: If symptoms are present, ask "Are they mild, moderate or severe?"     severe  Protocols used: MEDICATION QUESTION CALL-A-AH

## 2018-09-10 ENCOUNTER — Ambulatory Visit: Payer: PPO | Admitting: Internal Medicine

## 2018-09-10 DIAGNOSIS — N201 Calculus of ureter: Secondary | ICD-10-CM | POA: Diagnosis not present

## 2018-09-12 ENCOUNTER — Telehealth: Payer: Self-pay | Admitting: Internal Medicine

## 2018-09-12 ENCOUNTER — Encounter: Payer: Self-pay | Admitting: *Deleted

## 2018-09-12 NOTE — Telephone Encounter (Signed)
Please advise 

## 2018-09-12 NOTE — Telephone Encounter (Signed)
Copied from [place] [id number]. Topic: Quick Communication - See Telephone Encounter >> Sep 12, 2018 11:19 AM Ahmed Prima L wrote: CRM for notification. See Telephone encounter for: 09/12/18.  Patient would like Larena Glassman to call her back regarding tizanidine (ZANAFLEX) 2 MG capsule..she said that this medication was discussed at his appointment earlier in the week. She has tried this 2mg  with him and thinks that he needs to stay on this milligram. Please advise.

## 2018-09-12 NOTE — Telephone Encounter (Signed)
LMTCB

## 2018-09-16 NOTE — Telephone Encounter (Signed)
Patients wife called back and would like another return call ,please call her at [phone],

## 2018-09-17 ENCOUNTER — Other Ambulatory Visit: Payer: Self-pay | Admitting: Internal Medicine

## 2018-09-17 MED ORDER — TIZANIDINE HCL 2 MG PO CAPS: ORAL_CAPSULE | ORAL | 0 refills | Status: DC

## 2018-09-17 NOTE — Telephone Encounter (Signed)
rx sent in for zanflex 1-2 q hs prn #60 with no refill.

## 2018-09-17 NOTE — Telephone Encounter (Signed)
Patients wife increased Zanaflex 2 tablets at night for 2 or 3 nights and did not see any change so she cut back down to 1 tablet. She is wondering if she could have rx for 1-2 tablets qhs. If the pharmacy is not able to do so she is okay with leaving him on 1 tablet qhs.

## 2018-09-24 ENCOUNTER — Ambulatory Visit: Payer: PPO | Attending: Internal Medicine | Admitting: Physical Therapy

## 2018-09-24 ENCOUNTER — Encounter: Payer: Self-pay | Admitting: Physical Therapy

## 2018-09-24 DIAGNOSIS — R2689 Other abnormalities of gait and mobility: Secondary | ICD-10-CM | POA: Insufficient documentation

## 2018-09-24 DIAGNOSIS — M6281 Muscle weakness (generalized): Secondary | ICD-10-CM | POA: Insufficient documentation

## 2018-09-24 DIAGNOSIS — R262 Difficulty in walking, not elsewhere classified: Secondary | ICD-10-CM

## 2018-09-24 NOTE — Therapy (Signed)
[place] MAIN [facility] SERVICES [address] [place], [place], [address] Phone: [phone]   Fax:  [phone]  Physical Therapy Evaluation  Patient Details  Name: Kyle Richardson MRN: [id number] Date of Birth: [birth year] Referring Provider (PT): Einar Pheasant   Encounter Date: 09/24/2018  PT End of Session - 09/24/18 1023    Visit Number  1    Number of Visits  25    Date for PT Re-Evaluation  12/17/18    Authorization Type  1/10 begin on 09/24/18    PT Start Time  1010    PT Stop Time  1100    PT Time Calculation (min)  50 min    Equipment Utilized During Treatment  Gait belt    Activity Tolerance  Patient tolerated treatment well    Behavior During Therapy  WFL for tasks assessed/performed       Past Medical History:  Diagnosis Date  . Abdominal fibromatosis   . 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  s/p PTCA of the mid RCA 1/96, also  s/p  CABG x 4 7/96  . Complication of anesthesia    confusion and hallucinations for several days after receiving Versed  . Degenerative disc disease   . Diabetes mellitus ([place])   . Glaucoma   . Hypercholesterolemia   . Hypertension   . Migraine headache    history of  . Myocardial infarction ([place]) 1996  . Osteoarthritis   . Sleep apnea   . Stroke [facility]) 02/22/2017   affected left side     Past Surgical History:  Procedure Laterality Date  . APPENDECTOMY    . CAROTID ANGIOGRAPHY Bilateral 04/19/2018   Procedure: CAROTID ANGIOGRAPHY;  Surgeon: Algernon Huxley, MD;  Location: [place] CV LAB;  Service: Cardiovascular;  Laterality: Bilateral;  . CAROTID PTA/STENT INTERVENTION Right 05/22/2018   Procedure: CAROTID PTA/STENT INTERVENTION;  Surgeon: Katha Cabal, MD;  Location: [place] CV LAB;  Service: Cardiovascular;  Laterality: Right;  . CATARACT EXTRACTION W/ INTRAOCULAR LENS  IMPLANT, BILATERAL    . CHOLECYSTECTOMY     Removed  . CORONARY  ARTERY BYPASS GRAFT  7/96   x4  . HEMORRHOID SURGERY    . HERNIA REPAIR    . TONSILLECTOMY    . UPP and tonsillectomy  1/86    There were no vitals filed for this visit.   Subjective Assessment - 09/24/18 1015    Subjective  Patient is wanting to walk better. He says he has some pain in his hands.    Patient is accompained by:  Family member    Pertinent History  Patient had a cva 02/21/18. Prior to CVA he was ambulating without a device and enjoyed gardening. After the CVA he went to [facility] inpatient rehab for several  weeks followed by speech and PT at home for 3 months.. He had a kidney stone and he went to [place] in November for a week. He went home after [facility]. Now he presents to outpatient rehab to improve strength and ambulation. He is walking with a Kyle several feet from bed to chair with assist from caregiver. He needs caregiver assist to use the toilet, shower and dress. His wife recently went to the hospital following a fractured hip and now is at [facility] for rehab. His daughter is on medical leave to help support her mom and dad during this recovery period.     Patient Stated Goals  Patient wants to be able to walk better.     Currently in Pain?  Yes    Pain Score  2     Pain Location  Hand    Pain Orientation  Right;Left    Pain Descriptors / Indicators  Aching    Pain Type  Acute pain         [facility] PT Assessment - 09/24/18 1025      Assessment   Medical Diagnosis  cva    Referring Provider (PT)  Nicki Reaper, CHARLENE    Onset Date/Surgical Date  02/21/18    Hand Dominance  Right      Precautions   Precautions  None      Restrictions   Weight Bearing Restrictions  No      Balance Screen   Has the patient fallen in the past 6 months  Yes    How many times?  1    Has the patient had a decrease in activity level because of a fear of falling?   No    Is the patient reluctant to leave their home because of a fear of falling?   No      Home Engineer, building services residence    Living Arrangements  Other (Comment)    Available Help at Discharge  --   CNA 8 hours/ day   Type of [place]  One level    [place] - 2 wheels;Bedside commode;Tub bench;Grab bars - toilet;Grab bars - tub/shower;Hand held shower head;Wheelchair - manual      Prior Function   Level of Independence  Independent;Independent with household mobility without device;Independent with gait;Independent with transfers    Vocation  Retired      [employer]   Overall Cognitive Status  Impaired/Different from baseline         POSTURE:  WFL standing  PROM/AROM: WFL BUE and BLE  STRENGTH:  Graded on a 0-5 scale Muscle Group Left Right                          Hip Flex 4/5 3+/5  Hip Abd -3/5 -3/5  Hip Add 2/52/5       4/5    Knee Flex 4/5 4/5  Knee Ext 4/5 4/5  Ankle DF 4/5 4/5  Ankle PF 4/5 4/5   SENSATION: R hand light touch and deep touch impaired  FUNCTIONAL MOBILITY:  Patient needs mod assist for supine <> sit Patient needs mod assist for sit to stand with RW Patient needs mod assist for rolling left and right   Coordination: Poor coordination BUE and BLE    BALANCE: Static Standing Balance  Normal Able to maintain standing balance against maximal resistance   Good Able to maintain standing balance against moderate resistance   Good-/Fair+ Able to maintain standing balance against minimal resistance   Fair Able to stand unsupported without UE support and without LOB for 1-2 min   Fair- Requires Min A and UE support to maintain standing without loss of balance x  Poor+ Requires mod A and UE support to maintain standing without loss of balance   Poor Requires max A and UE support to maintain standing balance without loss    Standing Dynamic Balance  Normal Stand independently unsupported, able to weight shift and cross midline maximally   Good  Stand independently unsupported, able  to weight shift and cross midline moderately   Good-/Fair+ Stand independently unsupported, able to weight shift across midline minimally   Fair Stand independently unsupported, weight shift, and reach ipsilaterally, loss of balance when crossing midline   Poor+ Able to stand with Min A and reach ipsilaterally, unable to weight shift   Poor Able to stand with Mod A and minimally reach ipsilaterally, unable to cross midline. x      GAIT:Patient ambulates with right sided neglect, 50 feet with assist to position the RW correctly, path deviation, RLE poor foot placement and mod assist  OUTCOME MEASURES: TEST Outcome Interpretation  5 times sit<>stand 45.32sec >64 yo, >15 sec indicates increased risk for falls  10 meter walk test    . 24             m/s <1.0 m/s indicates increased risk for falls; limited community ambulator  Timed up and Go  65 sec               sec <14 sec indicates increased risk for falls                Treatment: Bridging x 10 hooklying abd/ER x 10 with GTB Trunk rotation left and right x 10 Daughter and caregiver both instructed for HEP       Objective measurements completed on examination: See above findings.              PT Education - 09/24/18 1022    Education Details  plan of care    Person(s) Educated  Patient    Methods  Explanation    Comprehension  Verbalized understanding       PT Short Term Goals - 09/24/18 1337      PT SHORT TERM GOAL #1   Title  Patient will be independent in home exercise program to improve strength/mobility for better functional independence with ADLs.    Time  6    Period  Weeks    Status  New    Target Date  11/05/18      PT SHORT TERM GOAL #2   Title  Patient (> 41 years old) will complete five times sit to stand test in < 15 seconds indicating an increased LE strength and improved balance.    Time  6    Period  Weeks    Status  New    Target Date  11/05/18        PT Long Term Goals -  09/24/18 1338      PT LONG TERM GOAL #1   Title  Patient will increase 10 meter walk test to >1.74m/s as to improve gait speed for better community ambulation and to reduce fall risk.    Time  12    Period  Weeks    Status  New    Target Date  12/17/18      PT LONG TERM GOAL #2   Title  Patient will reduce timed up and go to <11 seconds to reduce fall risk and demonstrate improved transfer/gait ability.    Time  12    Period  Weeks    Status  New    Target Date  12/17/18      PT LONG TERM GOAL #3   Title  Patient will roll left and right and be able to perform sidelying to sit  independently.     Time  12    Period  Weeks  Status  New    Target Date  12/17/18      PT LONG TERM GOAL #4   Title  Patient will be able to perform transfers from bed<>wc independently.    Time  12    Period  Weeks    Status  New    Target Date  12/17/18             Plan - 09/24/18 1326    Clinical Impression Statement  Patient had a CVA 02/21/18, followed by inpatient rehab and HHPT. He continues to have deficits in strength, balance, gait ,transfers, bedmobiltiy and selfcare. He reports B hand pain that is chronic, and decreased sensation to light touch to R hand.  He has caregivers 7 days / week to assist with mobility , dressing, and bathing . He has right sided neglect , decreased motor control and decreased coordination. He ambulates with RW 50 feet with mod assist on level surfaces. He has a high falls risk with all mobility. He will benefit from skilled PT to improve safety and mobility.     Clinical Presentation  Stable    Clinical Decision Making  Low    Rehab Potential  Fair    PT Frequency  2x / week    PT Duration  12 weeks    PT Treatment/Interventions  Manual techniques;Neuromuscular re-education;Patient/family education;Balance training;Therapeutic exercise;Therapeutic activities;Functional mobility training;Aquatic Therapy;Electrical Stimulation;Moist Heat;Ultrasound;Stair  training;Gait training;Wheelchair mobility training    PT Next Visit Plan  strengthening and mobility training    PT Home Exercise Plan  bridges, hip abd/ER wiht gTB, trunk rotation in supine    Consulted and Agree with Plan of Care  Patient;Family member/caregiver       Patient will benefit from skilled therapeutic intervention in order to improve the following deficits and impairments:  Abnormal gait, Decreased balance, Decreased endurance, Decreased mobility, Difficulty walking, Impaired tone, Pain, Impaired UE functional use, Decreased strength, Decreased safety awareness, Decreased coordination, Decreased activity tolerance  Visit Diagnosis: Muscle weakness (generalized)  Difficulty in walking, not elsewhere classified  Other abnormalities of gait and mobility     Problem List Patient Active Problem List   Diagnosis Date Noted  . Ureteral stone 08/25/2018  . Hydroureteronephrosis 08/05/2018  . Rotator cuff tendinitis, right 07/22/2018  . CVA, old, dysarthria 06/04/2018  . Hemiparesis affecting dominant side as late effect of cerebrovascular accident ([place]) 05/27/2018  . Encephalopathy   . Carotid stenosis, symptomatic, with infarction ([place]) 05/22/2018  . TIA (transient ischemic attack) 04/15/2018  . Episode of confusion 04/08/2018  . Labile blood pressure   . Acute ischemic left PCA stroke ([place]) 02/27/2018  . Diabetes mellitus type 2 in nonobese (HCC)   . Glaucoma   . MG, ocular (myasthenia gravis) ([place])   . CVA (cerebral vascular accident) ([place]) 02/24/2018  . Right homonymous hemianopsia 02/23/2018  . Unresponsive episode 02/22/2018  . UTI (urinary tract infection) 01/28/2018  . Abnormal chest CT 09/20/2017  . Double vision 01/21/2017  . Ascending aortic aneurysm ([place]) 12/22/2016  . Blood-tinged sputum 10/23/2016  . Cough 10/23/2016  . PAF (paroxysmal atrial fibrillation) ([place]) 09/14/2016  . Health care maintenance 12/26/2014  . Stress 07/05/2014  . Apnea, sleep  06/18/2014  . Migraine 05/10/2013  . Calculus of kidney 01/01/2013  . Type 2 diabetes mellitus with complication, without long-term current use of insulin ([place]) 08/20/2012  . Hypertension 08/20/2012  . Hypercholesterolemia 08/20/2012  . CAD (coronary artery disease) 08/20/2012  . Benign localized hyperplasia of prostate without urinary  obstruction and other lower urinary tract symptoms (LUTS) 02/28/2012    Alanson Puls, PT DPT 09/24/2018, 1:43 PM  [place] MAIN [facility] SERVICES [place], [place], [address] Phone: [phone]   Fax:  [phone]  Name: Kyle Richardson MRN: [id number] Date of Birth: Oct 25, 1927

## 2018-09-26 ENCOUNTER — Encounter: Payer: PPO | Admitting: Occupational Therapy

## 2018-09-26 DIAGNOSIS — H9193 Unspecified hearing loss, bilateral: Secondary | ICD-10-CM | POA: Diagnosis not present

## 2018-09-26 DIAGNOSIS — Z8673 Personal history of transient ischemic attack (TIA), and cerebral infarction without residual deficits: Secondary | ICD-10-CM | POA: Diagnosis not present

## 2018-09-26 DIAGNOSIS — I480 Paroxysmal atrial fibrillation: Secondary | ICD-10-CM | POA: Diagnosis not present

## 2018-09-26 DIAGNOSIS — N201 Calculus of ureter: Secondary | ICD-10-CM | POA: Diagnosis not present

## 2018-09-26 DIAGNOSIS — H547 Unspecified visual loss: Secondary | ICD-10-CM | POA: Diagnosis not present

## 2018-09-26 DIAGNOSIS — Z87898 Personal history of other specified conditions: Secondary | ICD-10-CM | POA: Diagnosis not present

## 2018-09-26 DIAGNOSIS — Z01818 Encounter for other preprocedural examination: Secondary | ICD-10-CM | POA: Diagnosis not present

## 2018-09-26 DIAGNOSIS — F0150 Vascular dementia without behavioral disturbance: Secondary | ICD-10-CM | POA: Diagnosis not present

## 2018-09-27 DIAGNOSIS — I639 Cerebral infarction, unspecified: Secondary | ICD-10-CM | POA: Diagnosis not present

## 2018-10-03 ENCOUNTER — Encounter: Payer: Self-pay | Admitting: Physical Therapy

## 2018-10-03 ENCOUNTER — Ambulatory Visit: Payer: PPO | Admitting: Physical Therapy

## 2018-10-03 DIAGNOSIS — R2689 Other abnormalities of gait and mobility: Secondary | ICD-10-CM

## 2018-10-03 DIAGNOSIS — M6281 Muscle weakness (generalized): Secondary | ICD-10-CM | POA: Diagnosis not present

## 2018-10-03 DIAGNOSIS — R262 Difficulty in walking, not elsewhere classified: Secondary | ICD-10-CM

## 2018-10-03 NOTE — Therapy (Signed)
[place] MAIN [facility] SERVICES [address] [place], [place], [address] Phone: [phone]   Fax:  [phone]  Physical Therapy Treatment  Patient Details  Name: Kyle Richardson MRN: [id number] Date of Birth: [birth year] Referring Provider (PT): Einar Pheasant   Encounter Date: 10/03/2018  PT End of Session - 10/03/18 0943    Visit Number  2    Number of Visits  25    Date for PT Re-Evaluation  12/17/18    Authorization Type  2/10 begin on 09/24/18    PT Start Time  0945    PT Stop Time  1020    PT Time Calculation (min)  35 min    Equipment Utilized During Treatment  Gait belt    Activity Tolerance  Patient tolerated treatment well;Patient limited by fatigue    Behavior During Therapy  --   Confused throughout session      Past Medical History:  Diagnosis Date  . Abdominal fibromatosis   . 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  s/p PTCA of the mid RCA 1/96, also  s/p  CABG x 4 7/96  . Complication of anesthesia    confusion and hallucinations for several days after receiving Versed  . Degenerative disc disease   . Diabetes mellitus ([place])   . Glaucoma   . Hypercholesterolemia   . Hypertension   . Migraine headache    history of  . Myocardial infarction ([place]) 1996  . Osteoarthritis   . Sleep apnea   . Stroke [facility]) 02/22/2017   affected left side     Past Surgical History:  Procedure Laterality Date  . APPENDECTOMY    . CAROTID ANGIOGRAPHY Bilateral 04/19/2018   Procedure: CAROTID ANGIOGRAPHY;  Surgeon: Algernon Huxley, MD;  Location: [place] CV LAB;  Service: Cardiovascular;  Laterality: Bilateral;  . CAROTID PTA/STENT INTERVENTION Right 05/22/2018   Procedure: CAROTID PTA/STENT INTERVENTION;  Surgeon: Katha Cabal, MD;  Location: [place] [facility];  Service: Cardiovascular;  Laterality: Right;  . CATARACT EXTRACTION W/ INTRAOCULAR LENS  IMPLANT, BILATERAL    . CHOLECYSTECTOMY      Removed  . CORONARY ARTERY BYPASS GRAFT  7/96   x4  . HEMORRHOID SURGERY    . HERNIA REPAIR    . TONSILLECTOMY    . UPP and tonsillectomy  1/86    There were no vitals filed for this visit.  Subjective Assessment - 10/03/18 0948    Subjective  Patient's daughter reports no falls or significant changes. Denies any pain. Patient states that he hasn't been able to get back into the routine of his HEP. Daughter reports that seated exercises are easier to complete because he is typically only laying down to go to bed. Patient arrived 11 minutes late. Patient's daughter reports that he did not sleep well last night and he seemed more delayed with verbal and physical responses to insructions/questions. Daughter reports he even had trouble sequencing eating breakfast.    Patient is accompained by:  Family member    Pertinent History  Patient had a cva 02/21/18. Prior to CVA he was ambulating without a device and enjoyed gardening. After the CVA he went to [facility] inpatient rehab for several  weeks followed by speech and PT at home for 3 months.. He had a kidney stone and he went to [place] in November for a week. He went home after [facility]. Now he presents to outpatient rehab to improve  strength and ambulation. He is walking with a walker several feet from bed to chair with assist from caregiver. He needs caregiver assist to use the toilet, shower and dress. His wife recently went to the hospital following a fractured hip and now is at [facility] for rehab. His daughter is on medical leave to help support her mom and dad during this recovery period.     Patient Stated Goals  Patient wants to be able to walk better.     Currently in Pain?  No/denies       TREATMENT  Therapeutic Exercise: Seated clamshells, GTB 2x10 Seated LAQ, GTB, 2x10 Seated marches, 3x10 Seated glute sets, 10x5 sec hold STS (from w/c) x4 attempts with RW; patient able to stand with mod A when daughter positioned in front.  Patient had immense confusion when sequencing the task and was unable to follow 1-step instructions with physical prompting.      PT Education - 10/03/18 1216    Education Details  seated HEP options    Person(s) Educated  Patient    Methods  Explanation;Demonstration;Verbal cues;Tactile cues    Comprehension  Need further instruction;Verbal cues required;Returned demonstration       PT Short Term Goals - 09/24/18 1337      PT SHORT TERM GOAL #1   Title  Patient will be independent in home exercise program to improve strength/mobility for better functional independence with ADLs.    Time  6    Period  Weeks    Status  New    Target Date  11/05/18      PT SHORT TERM GOAL #2   Title  Patient ( > 82 years old) will complete five times sit to stand test in < 15 seconds indicating an increased LE strength and improved balance.    Time  6    Period  Weeks    Status  New    Target Date  11/05/18        PT Long Term Goals - 09/24/18 1338      PT LONG TERM GOAL #1   Title  Patient will increase 10 meter walk test to >1.71m/s as to improve gait speed for better community ambulation and to reduce fall risk.    Time  12    Period  Weeks    Status  New    Target Date  12/17/18      PT LONG TERM GOAL #2   Title  Patient will reduce timed up and go to <11 seconds to reduce fall risk and demonstrate improved transfer/gait ability.    Time  12    Period  Weeks    Status  New    Target Date  12/17/18      PT LONG TERM GOAL #3   Title  Patient will roll left and right and be able to perform sidelying to sit  independently.     Time  12    Period  Weeks    Status  New    Target Date  12/17/18      PT LONG TERM GOAL #4   Title  Patient will be able to perform transfers from bed<>wc independently.    Time  12    Period  Weeks    Status  New    Target Date  12/17/18            Plan - 10/03/18 1221    Clinical Impression Statement  Patient presents to clinic with increased  confusion and fatigue, and decreased attention for tasks, but is amenable to therapy. Patient able to perform seated BLE strengthening with greater motor control and strength noted on the L. Patient required max verbal cues, visual cues, and tactile cues for exercise technique and sequencing. Patient's cognitive state today was a limiting factor for attempting w/c to bed transfers and STS transfers. Patient will benefit from continued skilled therapeutic intervention in order to address deficits and improve safety and mobility.    Rehab Potential  Fair    PT Frequency  2x / week    PT Duration  12 weeks    PT Treatment/Interventions  Manual techniques;Neuromuscular re-education;Patient/family education;Balance training;Therapeutic exercise;Therapeutic activities;Functional mobility training;Aquatic Therapy;Electrical Stimulation;Moist Heat;Ultrasound;Stair training;Gait training;Wheelchair mobility training    PT Next Visit Plan  strengthening and mobility training    PT Home Exercise Plan  bridges, hip abd/ER wiht gTB, trunk rotation in supine    Consulted and Agree with Plan of Care  Patient;Family member/caregiver       Patient will benefit from skilled therapeutic intervention in order to improve the following deficits and impairments:  Abnormal gait, Decreased balance, Decreased endurance, Decreased mobility, Difficulty walking, Impaired tone, Pain, Impaired UE functional use, Decreased strength, Decreased safety awareness, Decreased coordination, Decreased activity tolerance  Visit Diagnosis: Muscle weakness (generalized)  Difficulty in walking, not elsewhere classified  Other abnormalities of gait and mobility     Problem List Patient Active Problem List   Diagnosis Date Noted  . Ureteral stone 08/25/2018  . Hydroureteronephrosis 08/05/2018  . Rotator cuff tendinitis, right 07/22/2018  . CVA, old, dysarthria 06/04/2018  . Hemiparesis affecting dominant side as late effect of  cerebrovascular accident ([place]) 05/27/2018  . Encephalopathy   . Carotid stenosis, symptomatic, with infarction ([place]) 05/22/2018  . TIA (transient ischemic attack) 04/15/2018  . Episode of confusion 04/08/2018  . Labile blood pressure   . Acute ischemic left PCA stroke ([place]) 02/27/2018  . Diabetes mellitus type 2 in nonobese (HCC)   . Glaucoma   . MG, ocular (myasthenia gravis) ([place])   . CVA (cerebral vascular accident) ([place]) 02/24/2018  . Right homonymous hemianopsia 02/23/2018  . Unresponsive episode 02/22/2018  . UTI (urinary tract infection) 01/28/2018  . Abnormal chest CT 09/20/2017  . Double vision 01/21/2017  . Ascending aortic aneurysm ([place]) 12/22/2016  . Blood-tinged sputum 10/23/2016  . Cough 10/23/2016  . PAF (paroxysmal atrial fibrillation) ([place]) 09/14/2016  . Health care maintenance 12/26/2014  . Stress 07/05/2014  . Apnea, sleep 06/18/2014  . Migraine 05/10/2013  . Calculus of kidney 01/01/2013  . Type 2 diabetes mellitus with complication, without long-term current use of insulin ([place]) 08/20/2012  . Hypertension 08/20/2012  . Hypercholesterolemia 08/20/2012  . CAD (coronary artery disease) 08/20/2012  . Benign localized hyperplasia of prostate without urinary obstruction and other lower urinary tract symptoms (LUTS) 02/28/2012    Myles Gip PT, DPT [id number] 10/03/2018, 12:31 PM  [place] MAIN [facility] SERVICES [address] [place], [place], [address] Phone: [phone]   Fax:  [phone]  Name: Deamonte Sayegh MRN: [id number] Date of Birth: 12-Apr-1928

## 2018-10-07 ENCOUNTER — Other Ambulatory Visit: Payer: Self-pay | Admitting: Internal Medicine

## 2018-10-09 ENCOUNTER — Other Ambulatory Visit: Payer: Self-pay

## 2018-10-09 ENCOUNTER — Inpatient Hospital Stay
Admission: EM | Admit: 2018-10-09 | Discharge: 2018-10-12 | DRG: 065 | Disposition: A | Discharge status: Home Health | Payer: PPO | Attending: Specialist | Admitting: Specialist

## 2018-10-09 ENCOUNTER — Emergency Department: Payer: PPO

## 2018-10-09 DIAGNOSIS — G4733 Obstructive sleep apnea (adult) (pediatric): Secondary | ICD-10-CM | POA: Diagnosis present

## 2018-10-09 DIAGNOSIS — N201 Calculus of ureter: Secondary | ICD-10-CM | POA: Diagnosis not present

## 2018-10-09 DIAGNOSIS — Z95820 Peripheral vascular angioplasty status with implants and grafts: Secondary | ICD-10-CM | POA: Diagnosis not present

## 2018-10-09 DIAGNOSIS — Z833 Family history of diabetes mellitus: Secondary | ICD-10-CM

## 2018-10-09 DIAGNOSIS — I639 Cerebral infarction, unspecified: Secondary | ICD-10-CM | POA: Diagnosis present

## 2018-10-09 DIAGNOSIS — H547 Unspecified visual loss: Secondary | ICD-10-CM | POA: Diagnosis not present

## 2018-10-09 DIAGNOSIS — I6522 Occlusion and stenosis of left carotid artery: Secondary | ICD-10-CM | POA: Diagnosis not present

## 2018-10-09 DIAGNOSIS — H47612 Cortical blindness, left side of brain: Secondary | ICD-10-CM | POA: Diagnosis present

## 2018-10-09 DIAGNOSIS — Z8673 Personal history of transient ischemic attack (TIA), and cerebral infarction without residual deficits: Secondary | ICD-10-CM | POA: Diagnosis not present

## 2018-10-09 DIAGNOSIS — G9349 Other encephalopathy: Secondary | ICD-10-CM | POA: Diagnosis not present

## 2018-10-09 DIAGNOSIS — Z951 Presence of aortocoronary bypass graft: Secondary | ICD-10-CM

## 2018-10-09 DIAGNOSIS — F0150 Vascular dementia without behavioral disturbance: Secondary | ICD-10-CM | POA: Diagnosis present

## 2018-10-09 DIAGNOSIS — I69398 Other sequelae of cerebral infarction: Secondary | ICD-10-CM | POA: Diagnosis not present

## 2018-10-09 DIAGNOSIS — Z87442 Personal history of urinary calculi: Secondary | ICD-10-CM

## 2018-10-09 DIAGNOSIS — H9190 Unspecified hearing loss, unspecified ear: Secondary | ICD-10-CM | POA: Diagnosis not present

## 2018-10-09 DIAGNOSIS — H47611 Cortical blindness, right side of brain: Secondary | ICD-10-CM | POA: Diagnosis present

## 2018-10-09 DIAGNOSIS — Z9861 Coronary angioplasty status: Secondary | ICD-10-CM

## 2018-10-09 DIAGNOSIS — Z888 Allergy status to other drugs, medicaments and biological substances status: Secondary | ICD-10-CM

## 2018-10-09 DIAGNOSIS — I252 Old myocardial infarction: Secondary | ICD-10-CM

## 2018-10-09 DIAGNOSIS — H53133 Sudden visual loss, bilateral: Secondary | ICD-10-CM | POA: Diagnosis not present

## 2018-10-09 DIAGNOSIS — E7800 Pure hypercholesterolemia, unspecified: Secondary | ICD-10-CM | POA: Diagnosis present

## 2018-10-09 DIAGNOSIS — E785 Hyperlipidemia, unspecified: Secondary | ICD-10-CM | POA: Diagnosis present

## 2018-10-09 DIAGNOSIS — E1165 Type 2 diabetes mellitus with hyperglycemia: Secondary | ICD-10-CM | POA: Diagnosis present

## 2018-10-09 DIAGNOSIS — I2510 Atherosclerotic heart disease of native coronary artery without angina pectoris: Secondary | ICD-10-CM | POA: Diagnosis not present

## 2018-10-09 DIAGNOSIS — R319 Hematuria, unspecified: Secondary | ICD-10-CM | POA: Diagnosis present

## 2018-10-09 DIAGNOSIS — Z79899 Other long term (current) drug therapy: Secondary | ICD-10-CM

## 2018-10-09 DIAGNOSIS — I6389 Other cerebral infarction: Secondary | ICD-10-CM | POA: Diagnosis not present

## 2018-10-09 DIAGNOSIS — Z960 Presence of urogenital implants: Secondary | ICD-10-CM | POA: Diagnosis not present

## 2018-10-09 DIAGNOSIS — H409 Unspecified glaucoma: Secondary | ICD-10-CM | POA: Diagnosis not present

## 2018-10-09 DIAGNOSIS — R531 Weakness: Secondary | ICD-10-CM | POA: Diagnosis not present

## 2018-10-09 DIAGNOSIS — H539 Unspecified visual disturbance: Secondary | ICD-10-CM

## 2018-10-09 DIAGNOSIS — Z7982 Long term (current) use of aspirin: Secondary | ICD-10-CM | POA: Diagnosis not present

## 2018-10-09 DIAGNOSIS — H53461 Homonymous bilateral field defects, right side: Secondary | ICD-10-CM | POA: Diagnosis present

## 2018-10-09 DIAGNOSIS — E1151 Type 2 diabetes mellitus with diabetic peripheral angiopathy without gangrene: Secondary | ICD-10-CM | POA: Diagnosis not present

## 2018-10-09 DIAGNOSIS — I69351 Hemiplegia and hemiparesis following cerebral infarction affecting right dominant side: Secondary | ICD-10-CM | POA: Diagnosis not present

## 2018-10-09 DIAGNOSIS — I10 Essential (primary) hypertension: Secondary | ICD-10-CM | POA: Diagnosis present

## 2018-10-09 DIAGNOSIS — Z8249 Family history of ischemic heart disease and other diseases of the circulatory system: Secondary | ICD-10-CM

## 2018-10-09 DIAGNOSIS — R410 Disorientation, unspecified: Secondary | ICD-10-CM | POA: Diagnosis not present

## 2018-10-09 DIAGNOSIS — Z7902 Long term (current) use of antithrombotics/antiplatelets: Secondary | ICD-10-CM | POA: Diagnosis not present

## 2018-10-09 DIAGNOSIS — R29703 NIHSS score 3: Secondary | ICD-10-CM | POA: Diagnosis present

## 2018-10-09 DIAGNOSIS — I651 Occlusion and stenosis of basilar artery: Secondary | ICD-10-CM | POA: Diagnosis present

## 2018-10-09 DIAGNOSIS — F05 Delirium due to known physiological condition: Secondary | ICD-10-CM | POA: Diagnosis present

## 2018-10-09 DIAGNOSIS — R404 Transient alteration of awareness: Secondary | ICD-10-CM | POA: Diagnosis not present

## 2018-10-09 DIAGNOSIS — I480 Paroxysmal atrial fibrillation: Secondary | ICD-10-CM | POA: Diagnosis not present

## 2018-10-09 DIAGNOSIS — R4182 Altered mental status, unspecified: Secondary | ICD-10-CM | POA: Diagnosis not present

## 2018-10-09 DIAGNOSIS — Z7901 Long term (current) use of anticoagulants: Secondary | ICD-10-CM

## 2018-10-09 DIAGNOSIS — I4891 Unspecified atrial fibrillation: Secondary | ICD-10-CM | POA: Diagnosis not present

## 2018-10-09 DIAGNOSIS — E119 Type 2 diabetes mellitus without complications: Secondary | ICD-10-CM | POA: Diagnosis not present

## 2018-10-09 DIAGNOSIS — Z885 Allergy status to narcotic agent status: Secondary | ICD-10-CM

## 2018-10-09 DIAGNOSIS — Z881 Allergy status to other antibiotic agents status: Secondary | ICD-10-CM

## 2018-10-09 DIAGNOSIS — N390 Urinary tract infection, site not specified: Secondary | ICD-10-CM | POA: Diagnosis not present

## 2018-10-09 DIAGNOSIS — Z882 Allergy status to sulfonamides status: Secondary | ICD-10-CM

## 2018-10-09 DIAGNOSIS — H543 Unqualified visual loss, both eyes: Secondary | ICD-10-CM | POA: Diagnosis not present

## 2018-10-09 LAB — BASIC METABOLIC PANEL
ANION GAP: 7 (ref 5–15)
BUN: 16 mg/dL (ref 8–23)
CALCIUM: 8.6 mg/dL — AB (ref 8.9–10.3)
CO2: 26 mmol/L (ref 22–32)
Chloride: 105 mmol/L (ref 98–111)
Creatinine, Ser: 0.93 mg/dL (ref 0.61–1.24)
GLUCOSE: 187 mg/dL — AB (ref 70–99)
Potassium: 3.6 mmol/L (ref 3.5–5.1)
SODIUM: 138 mmol/L (ref 135–145)

## 2018-10-09 LAB — CBC WITH DIFFERENTIAL/PLATELET
Abs Immature Granulocytes: 0.01 10*3/uL (ref 0.00–0.07)
BASOS PCT: 1 %
Basophils Absolute: 0 10*3/uL (ref 0.0–0.1)
EOS ABS: 0.3 10*3/uL (ref 0.0–0.5)
Eosinophils Relative: 5 %
HCT: 40.3 % (ref 39.0–52.0)
Hemoglobin: 13.4 g/dL (ref 13.0–17.0)
IMMATURE GRANULOCYTES: 0 %
Lymphocytes Relative: 36 %
Lymphs Abs: 2.4 10*3/uL (ref 0.7–4.0)
MCH: 32.2 pg (ref 26.0–34.0)
MCHC: 33.3 g/dL (ref 30.0–36.0)
MCV: 96.9 fL (ref 80.0–100.0)
MONOS PCT: 12 %
Monocytes Absolute: 0.8 10*3/uL (ref 0.1–1.0)
NEUTROS PCT: 46 %
Neutro Abs: 3.1 10*3/uL (ref 1.7–7.7)
PLATELETS: 189 10*3/uL (ref 150–400)
RBC: 4.16 MIL/uL — ABNORMAL LOW (ref 4.22–5.81)
RDW: 12.5 % (ref 11.5–15.5)
WBC: 6.6 10*3/uL (ref 4.0–10.5)
nRBC: 0 % (ref 0.0–0.2)

## 2018-10-09 LAB — LACTIC ACID, PLASMA: Lactic Acid, Venous: 1.9 mmol/L (ref 0.5–1.9)

## 2018-10-09 LAB — PROTIME-INR
INR: 1.05
PROTHROMBIN TIME: 13.6 s (ref 11.4–15.2)

## 2018-10-09 LAB — TROPONIN I: Troponin I: <0.03 ng/mL (ref <0.03)

## 2018-10-09 NOTE — ED Provider Notes (Signed)
[facility] Emergency Department Provider Note   ____________________________________________   First MD Initiated Contact with Patient 10/09/18 2311     (approximate)  I have reviewed the triage vital signs and the nursing notes.   HISTORY  Chief Complaint Loss of Vision    HPI Kyle Richardson is a 85 y.o. male brought to the ED from home via EMS with a chief complaint of vision loss and right-sided weakness.  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  He is on aspirin, Plavix and Eliquis.  Daughter states at dinner tonight patient had complete vision loss.  Patient describes it as a shade being pulled over his eyes.  Subsequently he has slowly recovered his vision but it is not back to his baseline.  He is currently seeing shapes and light.  Daughter also mentions patient had some hematuria several days ago which has improved since.  He is scheduled for urological procedure on January 8.  Patient also notes his right side feels weaker than baseline.  Usually he requires a walker and assistance while ambulating.  Denies recent fever, chills, chest pain, shortness breath, abdominal pain, nausea or vomiting.  Currently complains of slight headache and pain behind both eyes which was improved by dimming the lights.   Past Medical History:  Diagnosis Date  . Abdominal fibromatosis   . 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  s/p PTCA of the mid RCA 1/96, also  s/p  CABG x 4 7/96  . Complication of anesthesia    confusion and hallucinations for several days after receiving Versed  . Degenerative disc disease   . Diabetes mellitus ([place])   . Glaucoma   . Hypercholesterolemia   . Hypertension   . Migraine headache    history of  . Myocardial infarction  ([place]) 1996  . Osteoarthritis   . Sleep apnea   . Stroke [facility]) 02/22/2017   affected left side     Patient Active Problem List   Diagnosis Date Noted  . Ureteral stone 08/25/2018  . Hydroureteronephrosis 08/05/2018  . Rotator cuff tendinitis, right 07/22/2018  . CVA, old, dysarthria 06/04/2018  . Hemiparesis affecting dominant side as late effect of cerebrovascular accident ([place]) 05/27/2018  . Encephalopathy   . Carotid stenosis, symptomatic, with infarction ([place]) 05/22/2018  . TIA (transient ischemic attack) 04/15/2018  . Episode of confusion 04/08/2018  . Labile blood pressure   . Acute ischemic left PCA stroke ([place]) 02/27/2018  . Diabetes mellitus type 2 in nonobese (HCC)   . Glaucoma   . MG, ocular (myasthenia gravis) ([place])   . CVA (cerebral vascular accident) ([place]) 02/24/2018  . Right homonymous hemianopsia 02/23/2018  . Unresponsive episode 02/22/2018  . UTI (urinary tract infection) 01/28/2018  . Abnormal chest CT 09/20/2017  . Double vision 01/21/2017  . Ascending aortic aneurysm ([place]) 12/22/2016  . Blood-tinged sputum 10/23/2016  . Cough 10/23/2016  . PAF (paroxysmal atrial fibrillation) ([place]) 09/14/2016  . Health care maintenance 12/26/2014  . Stress 07/05/2014  . Apnea, sleep 06/18/2014  . Migraine 05/10/2013  . Calculus of kidney 01/01/2013  . Type 2 diabetes mellitus with complication, without long-term current use of insulin ([place]) 08/20/2012  . Hypertension 08/20/2012  . Hypercholesterolemia 08/20/2012  . CAD (coronary artery disease) 08/20/2012  . Benign localized hyperplasia of prostate without  urinary obstruction and other lower urinary tract symptoms (LUTS) 02/28/2012    Past Surgical History:  Procedure Laterality Date  . APPENDECTOMY    . CAROTID ANGIOGRAPHY Bilateral 04/19/2018   Procedure: CAROTID ANGIOGRAPHY;  Surgeon: Algernon Huxley, MD;  Location: [place] [facility];  Service: Cardiovascular;  Laterality: Bilateral;  . CAROTID PTA/STENT  INTERVENTION Right 05/22/2018   Procedure: CAROTID PTA/STENT INTERVENTION;  Surgeon: Katha Cabal, MD;  Location: [place] [facility];  Service: Cardiovascular;  Laterality: Right;  . CATARACT EXTRACTION W/ INTRAOCULAR LENS  IMPLANT, BILATERAL    . CHOLECYSTECTOMY     Removed  . CORONARY ARTERY BYPASS GRAFT  7/96   x4  . HEMORRHOID SURGERY    . HERNIA REPAIR    . TONSILLECTOMY    . UPP and tonsillectomy  1/86    Prior to Admission medications   Medication Sig Start Date End Date Taking? Authorizing Provider  apixaban (ELIQUIS) 5 MG TABS tablet Take 1 tablet (5 mg total) by mouth 2 (two) times daily. 04/01/18  Yes Angiulli, Lavon Paganini, PA-C  aspirin EC 81 MG EC tablet Take 1 tablet (81 mg total) by mouth daily. 06/01/18  Yes Schnier, Dolores Lory, MD  atorvastatin (LIPITOR) 40 MG tablet TAKE 1 TABLET BY MOUTH DAILY FOR CHOLESTEROL Patient taking differently: Take 40 mg by mouth at bedtime.  10/07/18  Yes Einar Pheasant, MD  clopidogrel (PLAVIX) 75 MG tablet TAKE 1 TABLET BY MOUTH DAILY 08/22/18  Yes Schnier, Dolores Lory, MD  divalproex (DEPAKOTE ER) 500 MG 24 hr tablet Take 1 tablet (500 mg total) by mouth daily. Patient taking differently: Take 500 mg by mouth at bedtime.  07/30/18  Yes Einar Pheasant, MD  fluticasone (FLONASE) 50 MCG/ACT nasal spray SPRAY 2 SPRAYS IN EACH NOSTRIL ONCE A DAY Patient taking differently: Place 1 spray into both nostrils at bedtime.  02/20/18  Yes Einar Pheasant, MD  latanoprost (XALATAN) 0.005 % ophthalmic solution Place 1 drop into both eyes at bedtime.   Yes [provider]  loratadine (CLARITIN) 10 MG tablet Take 10 mg by mouth daily.   Yes [provider]  MELATONIN ER PO Take 1 tablet by mouth at bedtime.    Yes [provider]  metoprolol succinate (TOPROL-XL) 25 MG 24 hr tablet Take 0.5 tablets (12.5 mg total) by mouth daily. 04/22/18  Yes Pyreddy, Reatha Harps, MD  tizanidine (ZANAFLEX) 2 MG capsule 1-2 capsules q hs prn Patient  taking differently: Take 2 mg by mouth at bedtime.  09/17/18  Yes Einar Pheasant, MD  acetaminophen (TYLENOL) 500 MG tablet Take 1 tablet (500 mg total) by mouth every 6 (six) hours as needed. Patient not taking: Reported on 08/12/2018 04/01/18   Angiulli, Lavon Paganini, PA-C  Multiple Vitamins-Minerals (PRESERVISION AREDS 2 PO) Take 1 capsule by mouth 2 (two) times daily.    [provider]  nitroGLYCERIN (NITROSTAT) 0.4 MG SL tablet Place 0.4 mg under the tongue every 5 (five) minutes as needed for chest pain.     [provider]    Allergies Fentanyl; Flomax [tamsulosin hcl]; Lamisil [terbinafine]; Levofloxacin; Lorazepam; Metformin and related; Sertraline; Sulfa antibiotics; Versed [midazolam]; Clarithromycin; and Codeine  Family History  Problem Relation Age of Onset  . Heart disease Mother        died age 66  . Heart disease Father        and other vascular disease  . Diabetes Other        four siblings  . Heart  disease Brother        s/p CABG  . Colon cancer Neg Hx   . Prostate cancer Neg Hx     Social History Social History   Tobacco Use  . Smoking status: Never Smoker  . Smokeless tobacco: Never Used  Substance Use Topics  . Alcohol use: No    Alcohol/week: 0.0 standard drinks  . Drug use: No    Review of Systems  Constitutional: No fever/chills Eyes: Positive for visual changes. ENT: No sore throat. Cardiovascular: Denies chest pain. Respiratory: Denies shortness of breath. Gastrointestinal: No abdominal pain.  No nausea, no vomiting.  No diarrhea.  No constipation. Genitourinary: Negative for dysuria. Musculoskeletal: Negative for back pain. Skin: Negative for rash. Neurological: Positive for headache and worsening right sided focal weakness.  Negative for numbness.   ____________________________________________   PHYSICAL EXAM:  VITAL SIGNS: ED Triage Vitals  Enc Vitals Group     BP 10/09/18 2226 (!) 149/65     Pulse Rate 10/09/18  2226 63     Resp 10/09/18 2226 18     Temp 10/09/18 2226 98.1 F (36.7 C)     Temp Source 10/09/18 2226 Oral     SpO2 10/09/18 2226 99 %     Weight 10/09/18 2227 180 lb (81.6 kg)     Height 10/09/18 2227 5\' 6"  (1.676 m)     Head Circumference --      Peak Flow --      Pain Score 10/09/18 2227 0     Pain Loc --      Pain Edu? --      Excl. in [place]? --     Constitutional: Alert and oriented.  Chronically ill appearing and in no acute distress. Eyes: Conjunctivae are normal. PERRL. EOMI. Funduscopy difficult but grossly unremarkable. Head: Atraumatic. Nose: No congestion/rhinnorhea. Mouth/Throat: Mucous membranes are moist.  Oropharynx non-erythematous. Neck: No stridor.  No carotid bruits. Cardiovascular: Normal rate, regular rhythm. Grossly normal heart sounds.  Good peripheral circulation. Respiratory: Normal respiratory effort.  No retractions. Lungs CTAB. Gastrointestinal: Soft and nontender. No distention. No abdominal bruits. No CVA tenderness. Musculoskeletal: No lower extremity tenderness nor edema.  No joint effusions. Neurologic: Alert and oriented x3.  Normal speech and language. RUE/RLE 3/5 motor strength.   Skin:  Skin is warm, dry and intact. No rash noted. Psychiatric: Mood and affect are normal. Speech and behavior are normal.  ____________________________________________   LABS (all labs ordered are listed, but only abnormal results are displayed)  Labs Reviewed  BASIC METABOLIC PANEL - Abnormal; Notable for the following components:      Result Value   Glucose, Bld 187 (*)    Calcium 8.6 (*)    All other components within normal limits  CBC WITH DIFFERENTIAL/PLATELET - Abnormal; Notable for the following components:   RBC 4.16 (*)    All other components within normal limits  URINALYSIS, COMPLETE (UACMP) WITH MICROSCOPIC - Abnormal; Notable for the following components:   Color, Urine RED (*)    APPearance HAZY (*)    Glucose, UA 50 (*)    Hgb urine  dipstick LARGE (*)    Protein, ur 100 (*)    Leukocytes, UA TRACE (*)    RBC / HPF >50 (*)    All other components within normal limits  HEPATIC FUNCTION PANEL - Abnormal; Notable for the following components:   Total Protein 6.2 (*)    All other components within normal limits  URINE CULTURE  LACTIC  ACID, PLASMA  LACTIC ACID, PLASMA  TROPONIN I  PROTIME-INR  MAGNESIUM  PHOSPHORUS  CALCIUM, IONIZED   ____________________________________________  EKG  ED ECG REPORT I, Ayad Nieman J, the attending physician, personally viewed and interpreted this ECG.   Date: 10/09/2018  EKG Time: 2225  Rate: 63  Rhythm: normal EKG, normal sinus rhythm  Axis: RAD  Intervals:none  ST&T Change: Nonspecific  ____________________________________________  RADIOLOGY  ED MD interpretation: No ICH  Official radiology report(s): Ct Head Wo Contrast  Result Date: 10/09/2018 CLINICAL DATA:  Vision loss EXAM: CT HEAD WITHOUT CONTRAST TECHNIQUE: Contiguous axial images were obtained from the base of the skull through the vertex without intravenous contrast. COMPARISON:  05/23/2018 FINDINGS: Brain: There is no mass, hemorrhage or extra-axial collection. Generalized atrophy with ex vacuo dilatation of the occipital horn of left lateral ventricle. Old left PCA territory infarct. There is periventricular hypoattenuation compatible with chronic microvascular disease. Vascular: Atherosclerotic calcification of the internal carotid arteries at the skull base. No abnormal hyperdensity of the major intracranial arteries or dural venous sinuses. Skull: The visualized skull base, calvarium and extracranial soft tissues are normal. Sinuses/Orbits: No fluid levels or advanced mucosal thickening of the visualized paranasal sinuses. No mastoid or middle ear effusion. The orbits are normal. IMPRESSION: 1. No acute intracranial abnormality. 2. 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 Electronically  Signed   By: Ulyses Jarred M.D.   On: 10/09/2018 23:12    ____________________________________________   PROCEDURES  Procedure(s) performed:   NIH Stroke Scale  Interval: Baseline Time: 4:32 AM Person Administering Scale: Hildegard Hlavac J  Administer stroke scale items in the order listed. Record performance in each category after each subscale exam. Do not go back and change scores. Follow directions provided for each exam technique. Scores should reflect what the patient does, not what the clinician thinks the patient can do. The clinician should record answers while administering the exam and work quickly. Except where indicated, the patient should not be coached (i.e., repeated requests to patient to make a special effort).   1a  Level of consciousness: 0=alert; keenly responsive  1b. LOC questions:  0=Performs both tasks correctly  1c. LOC commands: 0=Performs both tasks correctly  2.  Best Gaze: 0=normal  3.  Visual: 1=Partial hemianopia  4. Facial Palsy: 0=Normal symmetric movement  5a.  Motor left arm: 0=No drift  5b.  Motor right arm: 1=Drift, limb holds 90 (or 45) degrees but drifts down before full 10 seconds: does not hit bed  6a. motor left leg: 0=No drift, limb holds 90 (or 45) degrees for full 10 seconds  6b  Motor right leg:  1=Drift, limb holds 90 (or 45) degrees but drifts down before full 10 seconds: does not hit bed  7. Limb Ataxia: 0=Absent  8.  Sensory: 0=Normal; no sensory loss  9. Best Language:  0=No aphasia, normal  10. Dysarthria: 0=Normal  11. Extinction and Inattention: 0=No abnormality  12. Distal motor function: 0=Normal   Total:   3    Procedures  Critical Care performed:   CRITICAL CARE Performed by: Paulette Blanch   Total critical care time: 30 minutes  Critical care time was exclusive of separately billable procedures and treating other patients.  Critical care was necessary to treat or prevent imminent or life-threatening  deterioration.  Critical care was time spent personally by me on the following activities: development of treatment plan with patient and/or surrogate as well as nursing, discussions with consultants, evaluation of patient's response  to treatment, examination of patient, obtaining history from patient or surrogate, ordering and performing treatments and interventions, ordering and review of laboratory studies, ordering and review of radiographic studies, pulse oximetry and re-evaluation of patient's condition.  ____________________________________________   INITIAL IMPRESSION / ASSESSMENT AND PLAN / ED COURSE  As part of my medical decision making, I reviewed the following data within the [place] History obtained from family, Nursing notes reviewed and incorporated, Labs reviewed, EKG interpreted, Old chart reviewed, Radiograph reviewed, Discussed with admitting physician and Notes from prior ED visits   85 year old male with prior CVA on 3 anticoagulants who presents with vision loss and right-sided weakness.  Differential diagnosis includes but is not limited to CVA, TIA, ICH, infectious, metabolic etiologies, etc.  Laboratory results unremarkable.  CT head does not reveal ICH.  Patient does not meet TPA criteria as he is out of the window.  Discussed with hospitalist who will evaluate patient in the emergency department for admission.      ____________________________________________   FINAL CLINICAL IMPRESSION(S) / ED DIAGNOSES  Final diagnoses:  Cerebrovascular accident (CVA), unspecified mechanism ([place])  Weakness  Vision changes     ED Discharge Orders    None       Note:  This document was prepared using Dragon voice recognition software and may include unintentional dictation errors.    Paulette Blanch, MD 10/10/18 [id number]

## 2018-10-09 NOTE — ED Triage Notes (Signed)
Pt arrived via ems from home with loss of vision. Pt was home with family when he states that he was unable to see. Pt has hx if stroke in May with right side visual and right sided deficits. Pt later told family that he was able to see shapes. Pt has hx of sundowners. Pt also c/o of passing blood clots when urinating. Pt had a stent placed a few months ago for sidney stone. Pt alert to name.

## 2018-10-10 ENCOUNTER — Other Ambulatory Visit: Payer: Self-pay

## 2018-10-10 ENCOUNTER — Observation Stay: Payer: PPO

## 2018-10-10 ENCOUNTER — Encounter: Payer: Self-pay | Admitting: Internal Medicine

## 2018-10-10 ENCOUNTER — Observation Stay
Admit: 2018-10-10 | Discharge: 2018-10-10 | Disposition: A | Discharge status: Home / Self Care | Payer: PPO | Attending: Internal Medicine | Admitting: Internal Medicine

## 2018-10-10 DIAGNOSIS — I10 Essential (primary) hypertension: Secondary | ICD-10-CM

## 2018-10-10 DIAGNOSIS — Z951 Presence of aortocoronary bypass graft: Secondary | ICD-10-CM

## 2018-10-10 DIAGNOSIS — I2510 Atherosclerotic heart disease of native coronary artery without angina pectoris: Secondary | ICD-10-CM | POA: Diagnosis not present

## 2018-10-10 DIAGNOSIS — I639 Cerebral infarction, unspecified: Secondary | ICD-10-CM | POA: Diagnosis not present

## 2018-10-10 DIAGNOSIS — I4891 Unspecified atrial fibrillation: Secondary | ICD-10-CM | POA: Diagnosis not present

## 2018-10-10 DIAGNOSIS — E119 Type 2 diabetes mellitus without complications: Secondary | ICD-10-CM

## 2018-10-10 DIAGNOSIS — H543 Unqualified visual loss, both eyes: Secondary | ICD-10-CM | POA: Diagnosis not present

## 2018-10-10 DIAGNOSIS — Z960 Presence of urogenital implants: Secondary | ICD-10-CM | POA: Diagnosis not present

## 2018-10-10 DIAGNOSIS — H53133 Sudden visual loss, bilateral: Secondary | ICD-10-CM | POA: Diagnosis not present

## 2018-10-10 DIAGNOSIS — Z8673 Personal history of transient ischemic attack (TIA), and cerebral infarction without residual deficits: Secondary | ICD-10-CM

## 2018-10-10 DIAGNOSIS — N201 Calculus of ureter: Secondary | ICD-10-CM | POA: Diagnosis not present

## 2018-10-10 DIAGNOSIS — Z7902 Long term (current) use of antithrombotics/antiplatelets: Secondary | ICD-10-CM | POA: Diagnosis not present

## 2018-10-10 DIAGNOSIS — Z7982 Long term (current) use of aspirin: Secondary | ICD-10-CM | POA: Diagnosis not present

## 2018-10-10 DIAGNOSIS — N390 Urinary tract infection, site not specified: Secondary | ICD-10-CM | POA: Diagnosis not present

## 2018-10-10 DIAGNOSIS — E785 Hyperlipidemia, unspecified: Secondary | ICD-10-CM | POA: Diagnosis not present

## 2018-10-10 DIAGNOSIS — R410 Disorientation, unspecified: Secondary | ICD-10-CM | POA: Diagnosis not present

## 2018-10-10 DIAGNOSIS — R4182 Altered mental status, unspecified: Secondary | ICD-10-CM | POA: Diagnosis not present

## 2018-10-10 LAB — URINALYSIS, COMPLETE (UACMP) WITH MICROSCOPIC
BILIRUBIN URINE: NEGATIVE
Bacteria, UA: NONE SEEN
Glucose, UA: 50 mg/dL — AB
Ketones, ur: NEGATIVE mg/dL
Nitrite: NEGATIVE
PH: 7 (ref 5.0–8.0)
Protein, ur: 100 mg/dL — AB
RBC / HPF: >50 RBC/hpf — ABNORMAL HIGH (ref 0–5)
SPECIFIC GRAVITY, URINE: 1.014 (ref 1.005–1.030)
Squamous Epithelial / LPF: NONE SEEN (ref 0–5)

## 2018-10-10 LAB — HEPATIC FUNCTION PANEL
ALT: 20 U/L (ref 0–44)
AST: 21 U/L (ref 15–41)
Albumin: 3.6 g/dL (ref 3.5–5.0)
Alkaline Phosphatase: 58 U/L (ref 38–126)
Bilirubin, Direct: <0.1 mg/dL (ref 0.0–0.2)
Total Bilirubin: 0.5 mg/dL (ref 0.3–1.2)
Total Protein: 6.2 g/dL — ABNORMAL LOW (ref 6.5–8.1)

## 2018-10-10 LAB — LACTIC ACID, PLASMA: Lactic Acid, Venous: 1.2 mmol/L (ref 0.5–1.9)

## 2018-10-10 LAB — PLATELET FUNCTION ASSAY: Collagen / Epinephrine: 169 seconds (ref 0–193)

## 2018-10-10 LAB — PHOSPHORUS: PHOSPHORUS: 3.7 mg/dL (ref 2.5–4.6)

## 2018-10-10 LAB — MAGNESIUM: Magnesium: 2 mg/dL (ref 1.7–2.4)

## 2018-10-10 MED ORDER — ACETAMINOPHEN 325 MG PO TABS
650.0000 mg | ORAL_TABLET | ORAL | Status: DC | PRN
  Administered 2018-10-10: 12:00:00 650 mg via ORAL
  Filled 2018-10-10: qty 2

## 2018-10-10 MED ORDER — SENNOSIDES-DOCUSATE SODIUM 8.6-50 MG PO TABS: 1.0000 | ORAL_TABLET | Freq: Every evening | ORAL | Status: DC | PRN

## 2018-10-10 MED ORDER — DIVALPROEX SODIUM ER 500 MG PO TB24
500.0000 mg | ORAL_TABLET | Freq: Every day | ORAL | Status: DC
  Administered 2018-10-10 – 2018-10-11 (×2): 500 mg via ORAL
  Filled 2018-10-10 (×3): qty 1

## 2018-10-10 MED ORDER — IOHEXOL 350 MG/ML SOLN
75.0000 mL | Freq: Once | INTRAVENOUS | Status: AC | PRN
  Administered 2018-10-10: 13:00:00 75 mL via INTRAVENOUS

## 2018-10-10 MED ORDER — STROKE: EARLY STAGES OF RECOVERY BOOK
Freq: Once | Status: AC
  Administered 2018-10-10: 1

## 2018-10-10 MED ORDER — NITROGLYCERIN 0.4 MG SL SUBL: 0.4000 mg | SUBLINGUAL_TABLET | SUBLINGUAL | Status: DC | PRN

## 2018-10-10 MED ORDER — METOPROLOL SUCCINATE ER 25 MG PO TB24
12.5000 mg | ORAL_TABLET | Freq: Every day | ORAL | Status: DC
  Administered 2018-10-10: 12:00:00 12.5 mg via ORAL
  Filled 2018-10-10: qty 1

## 2018-10-10 MED ORDER — MELATONIN 5 MG PO TABS
10.0000 mg | ORAL_TABLET | Freq: Every day | ORAL | Status: DC
  Administered 2018-10-10 – 2018-10-11 (×2): 10 mg via ORAL
  Filled 2018-10-10 (×3): qty 2

## 2018-10-10 MED ORDER — DIAZEPAM 5 MG PO TABS
5.0000 mg | ORAL_TABLET | Freq: Once | ORAL | Status: AC
  Administered 2018-10-10: 5 mg via ORAL
  Filled 2018-10-10: qty 1

## 2018-10-10 MED ORDER — ASPIRIN EC 81 MG PO TBEC
81.0000 mg | DELAYED_RELEASE_TABLET | Freq: Every day | ORAL | Status: DC
  Administered 2018-10-10 – 2018-10-12 (×3): 81 mg via ORAL
  Filled 2018-10-10 (×3): qty 1

## 2018-10-10 MED ORDER — TIZANIDINE HCL 4 MG PO TABS
2.0000 mg | ORAL_TABLET | Freq: Every evening | ORAL | Status: DC | PRN
  Administered 2018-10-12: 2 mg via ORAL
  Filled 2018-10-10 (×2): qty 1

## 2018-10-10 MED ORDER — FLUTICASONE PROPIONATE 50 MCG/ACT NA SUSP
1.0000 | Freq: Every day | NASAL | Status: DC
  Administered 2018-10-10 – 2018-10-11 (×2): 1 via NASAL
  Filled 2018-10-10: qty 16

## 2018-10-10 MED ORDER — ONDANSETRON HCL 4 MG/2ML IJ SOLN: 4.0000 mg | Freq: Four times a day (QID) | INTRAMUSCULAR | Status: DC | PRN

## 2018-10-10 MED ORDER — SODIUM CHLORIDE 0.9 % IV SOLN
1.0000 g | INTRAVENOUS | Status: DC
  Administered 2018-10-10 – 2018-10-11 (×2): 1 g via INTRAVENOUS
  Filled 2018-10-10 (×2): qty 1

## 2018-10-10 MED ORDER — BISACODYL 5 MG PO TBEC: 5.0000 mg | DELAYED_RELEASE_TABLET | Freq: Every day | ORAL | Status: DC | PRN

## 2018-10-10 MED ORDER — ACETAMINOPHEN 160 MG/5ML PO SOLN
650.0000 mg | ORAL | Status: DC | PRN
  Filled 2018-10-10: qty 20.3

## 2018-10-10 MED ORDER — LORAZEPAM 2 MG/ML IJ SOLN
1.0000 mg | Freq: Once | INTRAMUSCULAR | Status: AC
  Administered 2018-10-10: 15:00:00 1 mg via INTRAVENOUS
  Filled 2018-10-10: qty 1

## 2018-10-10 MED ORDER — LATANOPROST 0.005 % OP SOLN
1.0000 [drp] | Freq: Every day | OPHTHALMIC | Status: DC
  Administered 2018-10-10 – 2018-10-11 (×2): 1 [drp] via OPHTHALMIC
  Filled 2018-10-10: qty 2.5

## 2018-10-10 MED ORDER — APIXABAN 5 MG PO TABS
5.0000 mg | ORAL_TABLET | Freq: Two times a day (BID) | ORAL | Status: DC
  Administered 2018-10-10 – 2018-10-12 (×5): 5 mg via ORAL
  Filled 2018-10-10 (×5): qty 1

## 2018-10-10 MED ORDER — ACETAMINOPHEN 650 MG RE SUPP
650.0000 mg | RECTAL | Status: DC | PRN
  Filled 2018-10-10: qty 1

## 2018-10-10 MED ORDER — CLOPIDOGREL BISULFATE 75 MG PO TABS
75.0000 mg | ORAL_TABLET | Freq: Every day | ORAL | Status: DC
  Administered 2018-10-10: 12:00:00 75 mg via ORAL
  Filled 2018-10-10: qty 1

## 2018-10-10 MED ORDER — ATORVASTATIN CALCIUM 20 MG PO TABS
40.0000 mg | ORAL_TABLET | Freq: Every day | ORAL | Status: DC
  Administered 2018-10-10 – 2018-10-11 (×2): 40 mg via ORAL
  Filled 2018-10-10 (×2): qty 2

## 2018-10-10 NOTE — H&P (Signed)
[place] [facility] [place] NAME: Kyle Richardson    MR#:  [id number]  DATE OF BIRTH:  26-Oct-1927  DATE OF ADMISSION:  10/09/2018  PRIMARY CARE PHYSICIAN: Einar Pheasant, MD   REQUESTING/REFERRING PHYSICIAN: Paulette Blanch, MD  CHIEF COMPLAINT:   Chief Complaint  Patient presents with  . Loss of Vision    HISTORY OF PRESENT ILLNESS:  Kyle Richardson  is a 85 y.o. male with a known history of L nephrolithiasis/obstruction (s/p L ureteral stent 08/05/2018), R carotid stents (05/22/2018), CAD/MI (s/p CABG, stent), CVA (w/ residual R weakness; ASA + Plavix), Afib (Eliquis), HTN, HLD p/w acute sudden-onset B/L loss of vision. Pt is altered, AAOx1 (to self only) and cannot provide Hx or answer questions. His daughter provides Hx at bedside. She states that pt was in his usual state of health until 10/09/2018 evening. Pt experienced sudden-onset complete B/L vision loss @~2050PM. His family brought him to the hospital. He is able to see me during the exam; he points at me, and asks his son-in-law, "Who is this guy?" He is not able to cooperate for a thorough neurological exam. His daughter states he has expressive aphasia/word finding difficulty at times, but was able to carry on a conversation at home during the day. She states he does exhibit some intermittent confusion during the day, as well as sundowning. He is hard of hearing at baseline. His daughter states he is typically AAOx3 at baseline, but she may be understating his level of cognitive decline; pt's son-in-law says the pt has been declining significantly in the past 15mo or so.  CT head performed in ED (-) acute intracranial abnl. Pt's family states pt was refused for MRI/MRI in the past due to carotid stenting, and requested pt be cleared for imaging by Dr. Delana Meyer (Vascular surgery).  PAST MEDICAL HISTORY:   Past Medical History:  Diagnosis Date  . Abdominal fibromatosis   . CAD (coronary artery  disease)    s/p inferior MI with RV involvement 1/96.  s/p PTCA of the mid RCA 1/96, also  s/p  CABG x 4 7/96  . Complication of anesthesia    confusion and hallucinations for several days after receiving Versed  . Degenerative disc disease   . Diabetes mellitus ([place])   . Glaucoma   . Hypercholesterolemia   . Hypertension   . Migraine headache    history of  . Myocardial infarction ([place]) 1996  . Osteoarthritis   . Sleep apnea   . Stroke ([place]) 02/22/2017   affected left side     PAST SURGICAL HISTORY:   Past Surgical History:  Procedure Laterality Date  . APPENDECTOMY    . CAROTID ANGIOGRAPHY Bilateral 04/19/2018   Procedure: CAROTID ANGIOGRAPHY;  Surgeon: Algernon Huxley, MD;  Location: [facility];  Service: Cardiovascular;  Laterality: Bilateral;  . CAROTID PTA/STENT INTERVENTION Right 05/22/2018   Procedure: CAROTID PTA/STENT INTERVENTION;  Surgeon: Katha Cabal, MD;  Location: [facility];  Service: Cardiovascular;  Laterality: Right;  . CATARACT EXTRACTION W/ INTRAOCULAR LENS  IMPLANT, BILATERAL    . CHOLECYSTECTOMY     Removed  . CORONARY ARTERY BYPASS GRAFT  7/96   x4  . HEMORRHOID SURGERY    . HERNIA REPAIR    . TONSILLECTOMY    . UPP and tonsillectomy  1/86    SOCIAL HISTORY:   Social History   Tobacco Use  . Smoking status: Never Smoker  . Smokeless  tobacco: Never Used  Substance Use Topics  . Alcohol use: No    Alcohol/week: 0.0 standard drinks    FAMILY HISTORY:   Family History  Problem Relation Age of Onset  . Heart disease Mother        died age 33  . Heart disease Father        and other vascular disease  . Diabetes Other        four siblings  . Heart disease Brother        s/p CABG  . Colon cancer Neg Hx   . Prostate cancer Neg Hx     DRUG ALLERGIES:   Allergies  Allergen Reactions  . Fentanyl Other (See Comments)    Reaction: confusion  . Flomax [Tamsulosin Hcl] Other (See Comments)    Unknown  . Lamisil  [Terbinafine] Other (See Comments)    Unknown  . Levofloxacin Other (See Comments)    Diplopia. NEVER put patient on levoquin!  . Lorazepam Other (See Comments)    Reaction: confusion/ hallucinations   . Metformin And Related Diarrhea  . Sertraline Other (See Comments)    Unknown  . Sulfa Antibiotics Other (See Comments)    Unknown  . Versed [Midazolam] Other (See Comments)    Hallucinations and confusion  . Clarithromycin Diarrhea  . Codeine Other (See Comments)    Unknown    REVIEW OF SYSTEMS:   Review of Systems  Unable to perform ROS: Dementia  Neurological: Positive for sensory change.   MEDICATIONS AT HOME:   Prior to Admission medications   Medication Sig Start Date End Date Taking? Authorizing Provider  apixaban (ELIQUIS) 5 MG TABS tablet Take 1 tablet (5 mg total) by mouth 2 (two) times daily. 04/01/18  Yes Angiulli, Lavon Paganini, PA-C  aspirin EC 81 MG EC tablet Take 1 tablet (81 mg total) by mouth daily. 06/01/18  Yes Schnier, Dolores Lory, MD  atorvastatin (LIPITOR) 40 MG tablet TAKE 1 TABLET BY MOUTH DAILY FOR CHOLESTEROL Patient taking differently: Take 40 mg by mouth at bedtime.  10/07/18  Yes Einar Pheasant, MD  clopidogrel (PLAVIX) 75 MG tablet TAKE 1 TABLET BY MOUTH DAILY 08/22/18  Yes Schnier, Dolores Lory, MD  divalproex (DEPAKOTE ER) 500 MG 24 hr tablet Take 1 tablet (500 mg total) by mouth daily. Patient taking differently: Take 500 mg by mouth at bedtime.  07/30/18  Yes Einar Pheasant, MD  fluticasone (FLONASE) 50 MCG/ACT nasal spray SPRAY 2 SPRAYS IN EACH NOSTRIL ONCE A DAY Patient taking differently: Place 1 spray into both nostrils at bedtime.  02/20/18  Yes Einar Pheasant, MD  latanoprost (XALATAN) 0.005 % ophthalmic solution Place 1 drop into both eyes at bedtime.   Yes [provider]  loratadine (CLARITIN) 10 MG tablet Take 10 mg by mouth daily.   Yes [provider]  MELATONIN ER PO Take 1 tablet by mouth at bedtime.    Yes [provider]  metoprolol succinate (TOPROL-XL) 25 MG 24 hr tablet Take 0.5 tablets (12.5 mg total) by mouth daily. 04/22/18  Yes Pyreddy, Reatha Harps, MD  tizanidine (ZANAFLEX) 2 MG capsule 1-2 capsules q hs prn Patient taking differently: Take 2 mg by mouth at bedtime.  09/17/18  Yes Einar Pheasant, MD  acetaminophen (TYLENOL) 500 MG tablet Take 1 tablet (500 mg total) by mouth every 6 (six) hours as needed. Patient not taking: Reported on 08/12/2018 04/01/18   Angiulli, Lavon Paganini, PA-C  Multiple Vitamins-Minerals (PRESERVISION AREDS 2 PO) Take 1 capsule  by mouth 2 (two) times daily.    [provider]  nitroGLYCERIN (NITROSTAT) 0.4 MG SL tablet Place 0.4 mg under the tongue every 5 (five) minutes as needed for chest pain.     [provider]      VITAL SIGNS:  Blood pressure (!) 155/70, pulse 60, temperature (!) 97.5 F (36.4 C), temperature source Oral, resp. rate 18, height 5\' 6"  (1.676 m), weight 81.6 kg, SpO2 97 %.  PHYSICAL EXAMINATION:  Physical Exam Constitutional:      General: He is awake. He is not in acute distress.    Appearance: Normal appearance. He is well-developed and normal weight. He is ill-appearing. He is not toxic-appearing or diaphoretic.     Interventions: He is not intubated. HENT:     Head: Atraumatic.  Eyes:     General: Lids are normal. No scleral icterus.    Extraocular Movements: Extraocular movements intact.     Conjunctiva/sclera: Conjunctivae normal.  Neck:     Musculoskeletal: Neck supple.  Cardiovascular:     Rate and Rhythm: Normal rate and regular rhythm.  No extrasystoles are present.    Heart sounds: S1 normal and S2 normal. Murmur present. Systolic murmur present with a grade of 2/6. No friction rub. No gallop. No S3 or S4 sounds.   Pulmonary:     Effort: Pulmonary effort is normal. No tachypnea, bradypnea, accessory muscle usage, prolonged expiration, respiratory distress or retractions. He is not intubated.     Breath sounds:  Normal breath sounds and air entry. No stridor, decreased air movement or transmitted upper airway sounds. No decreased breath sounds, wheezing, rhonchi or rales.  Abdominal:     General: Bowel sounds are decreased. There is no distension.     Palpations: Abdomen is soft.     Tenderness: There is no abdominal tenderness. There is no guarding or rebound.  Musculoskeletal: Normal range of motion.        General: No swelling or tenderness.     Right lower leg: No edema.     Left lower leg: No edema.  Lymphadenopathy:     Cervical: No cervical adenopathy.  Skin:    General: Skin is warm and dry.     Findings: No erythema or rash.  Neurological:     Mental Status: He is alert. He is disoriented and confused.  Psychiatric:        Attention and Perception: He is inattentive.        Mood and Affect: Mood is anxious.        Speech: Speech is tangential.        Behavior: Behavior is uncooperative and agitated. Behavior is not slowed, aggressive, withdrawn, hyperactive or combative.        Thought Content: Thought content is not paranoid or delusional.        Cognition and Memory: Cognition is impaired. Memory is impaired.        Judgment: Judgment is impulsive.    LABORATORY PANEL:   CBC Recent Labs  Lab 10/09/18 2229  WBC 6.6  HGB 13.4  HCT 40.3  PLT 189   ------------------------------------------------------------------------------------------------------------------  Chemistries  Recent Labs  Lab 10/09/18 2229  NA 138  K 3.6  CL 105  CO2 26  GLUCOSE 187*  BUN 16  CREATININE 0.93  CALCIUM 8.6*   ------------------------------------------------------------------------------------------------------------------  Cardiac Enzymes Recent Labs  Lab 10/09/18 2229  TROPONINI <0.03   ------------------------------------------------------------------------------------------------------------------  RADIOLOGY:  Ct Head Wo Contrast  Result Date: 10/09/2018 CLINICAL DATA:  Vision loss EXAM: CT HEAD WITHOUT CONTRAST TECHNIQUE: Contiguous axial images were obtained from the base of the skull through the vertex without intravenous contrast. COMPARISON:  05/23/2018 FINDINGS: Brain: There is no mass, hemorrhage or extra-axial collection. Generalized atrophy with ex vacuo dilatation of the occipital horn of left lateral ventricle. Old left PCA territory infarct. There is periventricular hypoattenuation compatible with chronic microvascular disease. Vascular: Atherosclerotic calcification of the internal carotid arteries at the skull base. No abnormal hyperdensity of the major intracranial arteries or dural venous sinuses. Skull: The visualized skull base, calvarium and extracranial soft tissues are normal. Sinuses/Orbits: No fluid levels or advanced mucosal thickening of the visualized paranasal sinuses. No mastoid or middle ear effusion. The orbits are normal. IMPRESSION: 1. No acute intracranial abnormality. 2. 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 Electronically Signed   By: Ulyses Jarred M.D.   On: 10/09/2018 23:12   IMPRESSION AND PLAN:   A/P: 37W w/ PMHx L nephrolithiasis/obstruction (s/p L ureteral stent 08/05/2018), R carotid stents (05/22/2018), CAD/MI (s/p CABG, stent), CVA (w/ residual R weakness; ASA + Plavix), Afib (Eliquis), HTN, HLD p/w acute sudden-onset B/L loss of vision. AMS/confusion. Hyperglycemia, hypocalcemia. Abnormal urinalysis. -Acute vision loss: Vasculopath w/ Hx CVA p/w acute vision loss. CT head (-) acute intracranial pathology. Already on ASA + Plavix + Eliquis, and statin at home (all cont'd). CVA pathway, NIHSS, neuro checks, fall precautions. Echo pending. Would like to perform MRI/MRA head + neck, however pt's family requests MR imaging be cleared with Dr. Delana Meyer. I am also told pt will need pre-MRI Valium (2/2 claustrophobia). Neuro consult. -AMS/confusion: Unclear if confusional state is chronic or acute on chronic.  Suspect underlying vascular dementia. As above. U/A abnl, SIRS (-); Tx (below). -Abnormal urinalysis: U/A (+) Hgb, leuk est, protein, mucus, RBC, WBC. Likely 2/2 nephrolithiasis, recent stent, though unclear in setting of AMS. Ceftriaxone, UCx. -Hypocalcemia: Ionized calcium. -c/w home meds/formulary subs as tolerated. -FEN/GI: NPO (pending swallow eval). -DVT PPx: Eliquis. -Code status: Full code. -Disposition: Admission, > 2 midnights.   Addendum: As noted above, may need MRI/MRA head + neck. Pt's family requested this be cleared with Dr. Delana Meyer. I have sent a direct message. Pt's Urologist is Dr. Daisy Blossom at [facility]. I have direct messaged Dr. Alyson Ingles to ask if this poses contraindication to MRI. If cleared for MR imaging, will need pre-medication w/ Valium (2/2 claustrophobia).   All the records are reviewed and case discussed with ED provider. Management plans discussed with the patient, family and they are in agreement.  CODE STATUS: Full code.  TOTAL TIME TAKING CARE OF THIS PATIENT: 75 minutes.    Arta Silence M.D on 10/10/2018 at 3:47 AM  Between 7am to 6pm - Pager - [phone]  After 6pm go to [email or web address] - Proofreader  [facility]  Office  [phone]  CC: Primary care physician; Einar Pheasant, MD   Note: This dictation was prepared with Dragon dictation along with smaller phrase technology. Any transcriptional errors that result from this process are unintentional.

## 2018-10-10 NOTE — Progress Notes (Signed)
*  PRELIMINARY RESULTS* Echocardiogram 2D Echocardiogram has been performed.  Sherrie Sport 10/10/2018, 1:59 PM

## 2018-10-10 NOTE — Progress Notes (Signed)
[place] at [place] NAME: Kyle Richardson    MR#:  [id number]  DATE OF BIRTH:  [birth year]  SUBJECTIVE:   Patient admitted to the hospital secondary to sudden onset of vision loss bilaterally along with right-sided weakness.  Patient's daughter is at bedside.  Patient is currently confused/lethargic.  He is also very hard of hearing and difficult to communicate with the patient.  REVIEW OF SYSTEMS:    Review of Systems  Unable to perform ROS: Mental acuity    Nutrition: NPO Tolerating Diet: NO Tolerating PT: Await Eval.     DRUG ALLERGIES:   Allergies  Allergen Reactions  . Fentanyl Other (See Comments)    Reaction: confusion  . Flomax [Tamsulosin Hcl] Other (See Comments)    Unknown  . Lamisil [Terbinafine] Other (See Comments)    Unknown  . Levofloxacin Other (See Comments)    Diplopia. NEVER put patient on levoquin!  . Lorazepam Other (See Comments)    Reaction: confusion/ hallucinations   . Metformin And Related Diarrhea  . Sertraline Other (See Comments)    Unknown  . Sulfa Antibiotics Other (See Comments)    Unknown  . Versed [Midazolam] Other (See Comments)    Hallucinations and confusion  . Clarithromycin Diarrhea  . Codeine Other (See Comments)    Unknown    VITALS:  Blood pressure (!) 159/69, pulse 68, temperature 99.3 F (37.4 C), temperature source Oral, resp. rate 20, height 5\' 6"  (1.676 m), weight 81.6 kg, SpO2 94 %.  PHYSICAL EXAMINATION:   Physical Exam  GENERAL:  85 y.o.-year-old patient lying in bed lethargic/encephalopathic and confused.  EYES: Pupils equal, round, reactive to light. No scleral icterus. Extraocular muscles intact.  HEENT: Head atraumatic, normocephalic. Oropharynx and nasopharynx clear.  NECK:  Supple, no jugular venous distention. No thyroid enlargement, no tenderness.  LUNGS: Normal breath sounds bilaterally, no wheezing, rales, rhonchi. No use of accessory muscles of  respiration.  CARDIOVASCULAR: S1, S2 normal. No murmurs, rubs, or gallops.  ABDOMEN: Soft, nontender, nondistended. Bowel sounds present. No organomegaly or mass.  EXTREMITIES: No cyanosis, clubbing or edema b/l.    NEUROLOGIC: Cranial nerves II through XII are intact. No focal Motor or sensory deficits b/l.  Globally weak PSYCHIATRIC: The patient is alert and oriented x 1.  SKIN: No obvious rash, lesion, or ulcer.    LABORATORY PANEL:   CBC Recent Labs  Lab 10/09/18 2229  WBC 6.6  HGB 13.4  HCT 40.3  PLT 189   ------------------------------------------------------------------------------------------------------------------  Chemistries  Recent Labs  Lab 10/09/18 2229 10/10/18 0318  NA 138  --   K 3.6  --   CL 105  --   CO2 26  --   GLUCOSE 187*  --   BUN 16  --   CREATININE 0.93  --   CALCIUM 8.6*  --   MG  --  2.0  AST  --  21  ALT  --  20  ALKPHOS  --  58  BILITOT  --  0.5   ------------------------------------------------------------------------------------------------------------------  Cardiac Enzymes Recent Labs  Lab 10/09/18 2229  TROPONINI <0.03   ------------------------------------------------------------------------------------------------------------------  RADIOLOGY:  Ct Head Wo Contrast  Result Date: 10/09/2018 CLINICAL DATA:  Vision loss EXAM: CT HEAD WITHOUT CONTRAST TECHNIQUE: Contiguous axial images were obtained from the base of the skull through the vertex without intravenous contrast. COMPARISON:  05/23/2018 FINDINGS: Brain: There is no mass, hemorrhage or extra-axial collection. Generalized atrophy with ex vacuo  dilatation of the occipital horn of left lateral ventricle. Old left PCA territory infarct. There is periventricular hypoattenuation compatible with chronic microvascular disease. Vascular: Atherosclerotic calcification of the internal carotid arteries at the skull base. No abnormal hyperdensity of the major intracranial arteries or  dural venous sinuses. Skull: The visualized skull base, calvarium and extracranial soft tissues are normal. Sinuses/Orbits: No fluid levels or advanced mucosal thickening of the visualized paranasal sinuses. No mastoid or middle ear effusion. The orbits are normal. IMPRESSION: 1. No acute intracranial abnormality. 2. 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 Electronically Signed   By: Ulyses Jarred M.D.   On: 10/09/2018 23:12     ASSESSMENT AND PLAN:   85 year old male with past medical history of previous CVA, obstructive sleep apnea, osteoarthritis, hypertension, hyperlipidemia, glaucoma, diabetes, coronary artery disease who presented to the hospital due to sudden onset of vision loss along with right-sided weakness.  1.  Sudden onset visual loss/right-sided weakness- suspected to be secondary to stroke/posterior reversible encephalopathy as per neurology. -CT head was negative for acute pathology.  Continue aspirin, Eliquis, Plavix. - Awaiting MRI of the brain, CTA of the head neck. -Await PT/OT evaluation.  Await speech evaluation to start diet.  2.  Altered mental status/encephalopathy-secondary to the stroke and possible underlying vascular dementia. -Continue supportive care as mentioned above.  3.  History of atrial fibrillation-rate controlled.  Continue Toprol, Eliquis.  4.  Glaucoma- continue latanoprost eyedrops.  5.  HyperLipidemia-continue atorvastatin.     All the records are reviewed and case discussed with Care Management/Social Worker. Management plans discussed with the patient, family and they are in agreement.  CODE STATUS: Full code  DVT Prophylaxis: Eliquis  TOTAL TIME TAKING CARE OF THIS PATIENT: 30 minutes.   POSSIBLE D/C IN 1-2 DAYS, DEPENDING ON CLINICAL CONDITION.   Henreitta Leber M.D on 10/10/2018 at 1:40 PM  Between 7am to 6pm - Pager - [phone]  After 6pm go to [email or web address] - Proofreader  [facility]  Office  [phone]  CC: Primary care physician; Einar Pheasant, MD

## 2018-10-10 NOTE — Consult Note (Addendum)
Referring Physician:     Chief Complaint: Sudden bilateral vision loss  HPI: Kyle Richardson is an 85 y.o. 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 Patient states that he was eating dinner that night when he suddenly lost vision in both eyes. Patient describes episode as painless loss of vision as if a curtain was pulled down in both eyes without associated vertigo/dizziness. No symptoms of eye redness or pain and tearing associated with visual loss (intermittent angle closure glaucoma). Patient states nothing seem to have precipitated episode such as postural changes or exercise, loss of vision when eyes are moved into certain positions of gaze (gaze-evoked amaurosis) or loss of vision after exercise or a hot shower (Uhthoff's symptom) to suggest demyelinating disease of the optic nerve.  Patient state that his right side also felt weaker than baseline. Denies associated altered sensorium, speech abnormality, cranial nerve deficit, seizures, focal motor or sensory deficits, nausea or vomiting, ipsilateral or contralateral paralysis/weakness, numbness or tingling, involuntary movements, tremor. He denies history of head injury or trauma. He report that vision has gradually improved but currently not at baseline. Per patient's daughter who is currently at beside, he appeared confused and disoriented following the vision loss.  Initial NIH stroke scale 3.  CT head did not show acute intracranial abnormality.  He was deemed not a candidate for IV alteplase as he is out of the window period and also on anticoagulation.  Patient was therefore  admitted for further stroke work-up and management.  Date last known well: Date: 10/09/2018 Time last known well: Time: 18:00 tPA Given: No: Outside window period and on anticoagulation  Past Medical History:  Diagnosis Date  . Abdominal fibromatosis   . 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  s/p PTCA of the mid RCA 1/96, also  s/p  CABG x 4 7/96  . Complication of anesthesia    confusion and hallucinations for several days after receiving Versed  . Degenerative disc disease   . Diabetes mellitus ([place])   . Glaucoma   . Hypercholesterolemia   . Hypertension   . Migraine headache    history of  . Myocardial infarction ([place]) 1996  . Osteoarthritis   . Sleep apnea   . Stroke [facility]) 02/22/2017   affected left side     Past Surgical History:  Procedure Laterality Date  . APPENDECTOMY    . CAROTID ANGIOGRAPHY Bilateral 04/19/2018   Procedure: CAROTID ANGIOGRAPHY;  Surgeon: Algernon Huxley, MD;  Location: [facility];  Service: Cardiovascular;  Laterality: Bilateral;  . CAROTID PTA/STENT INTERVENTION Right 05/22/2018   Procedure: CAROTID PTA/STENT INTERVENTION;  Surgeon: Katha Cabal, MD;  Location: [facility];  Service: Cardiovascular;  Laterality: Right;  . CATARACT EXTRACTION W/ INTRAOCULAR LENS  IMPLANT, BILATERAL    . CHOLECYSTECTOMY     Removed  . CORONARY ARTERY BYPASS GRAFT  7/96   x4  . HEMORRHOID SURGERY    . HERNIA REPAIR    . TONSILLECTOMY    . UPP and tonsillectomy  1/86    Family History  Problem Relation Age of Onset  . Heart disease Mother  died age 73  . Heart disease Father        and other vascular disease  . Diabetes Other        four siblings  . Heart disease Brother        s/p CABG  . Colon cancer Neg Hx   . Prostate cancer Neg Hx    Social History:  reports that he has never smoked. He has never used smokeless tobacco. He reports that he does not drink alcohol or use  drugs.  Allergies:  Allergies  Allergen Reactions  . Fentanyl Other (See Comments)    Reaction: confusion  . Flomax [Tamsulosin Hcl] Other (See Comments)    Unknown  . Lamisil [Terbinafine] Other (See Comments)    Unknown  . Levofloxacin Other (See Comments)    Diplopia. NEVER put patient on levoquin!  . Lorazepam Other (See Comments)    Reaction: confusion/ hallucinations   . Metformin And Related Diarrhea  . Sertraline Other (See Comments)    Unknown  . Sulfa Antibiotics Other (See Comments)    Unknown  . Versed [Midazolam] Other (See Comments)    Hallucinations and confusion  . Clarithromycin Diarrhea  . Codeine Other (See Comments)    Unknown    Medications:  I have reviewed the patient's current medications. Prior to Admission:  Medications Prior to Admission  Medication Sig Dispense Refill Last Dose  . apixaban (ELIQUIS) 5 MG TABS tablet Take 1 tablet (5 mg total) by mouth 2 (two) times daily. 30 tablet 1 10/09/2018 at am  . aspirin EC 81 MG EC tablet Take 1 tablet (81 mg total) by mouth daily. 180 tablet 2 10/09/2018 at Unknown time  . atorvastatin (LIPITOR) 40 MG tablet TAKE 1 TABLET BY MOUTH DAILY FOR CHOLESTEROL (Patient taking differently: Take 40 mg by mouth at bedtime. ) 30 tablet 1 Past Week at Unknown time  . clopidogrel (PLAVIX) 75 MG tablet TAKE 1 TABLET BY MOUTH DAILY 30 tablet 2 10/09/2018 at Unknown time  . divalproex (DEPAKOTE ER) 500 MG 24 hr tablet Take 1 tablet (500 mg total) by mouth daily. (Patient taking differently: Take 500 mg by mouth at bedtime. ) 30 tablet 1 Past Week at Unknown time  . fluticasone (FLONASE) 50 MCG/ACT nasal spray SPRAY 2 SPRAYS IN EACH NOSTRIL ONCE A DAY (Patient taking differently: Place 1 spray into both nostrils at bedtime. ) 16 g 2 Past Week at Unknown time  . latanoprost (XALATAN) 0.005 % ophthalmic solution Place 1 drop into both eyes at bedtime.   Past Week at Unknown time  . loratadine (CLARITIN) 10 MG tablet Take 10 mg by  mouth daily.   10/09/2018 at Unknown time  . MELATONIN ER PO Take 1 tablet by mouth at bedtime.    Past Week at Unknown time  . metoprolol succinate (TOPROL-XL) 25 MG 24 hr tablet Take 0.5 tablets (12.5 mg total) by mouth daily. 30 tablet 1 10/09/2018 at Unknown time  . tizanidine (ZANAFLEX) 2 MG capsule 1-2 capsules q hs prn (Patient taking differently: Take 2 mg by mouth at bedtime. ) 60 capsule 0 Past Week at Unknown time  . acetaminophen (TYLENOL) 500 MG tablet Take 1 tablet (500 mg total) by mouth every 6 (six) hours as needed. (Patient not taking: Reported on 08/12/2018) 30 tablet 0 Not Taking at Unknown time  . Multiple Vitamins-Minerals (PRESERVISION AREDS 2 PO) Take 1 capsule by mouth 2 (two) times daily.   Not Taking at Unknown time  . nitroGLYCERIN (  NITROSTAT) 0.4 MG SL tablet Place 0.4 mg under the tongue every 5 (five) minutes as needed for chest pain.    Not Taking   Scheduled: . apixaban  5 mg Oral BID  . aspirin EC  81 mg Oral Daily  . atorvastatin  40 mg Oral QHS  . clopidogrel  75 mg Oral Daily  . divalproex  500 mg Oral QHS  . fluticasone  1 spray Each Nare QHS  . latanoprost  1 drop Both Eyes QHS  . Melatonin  10 mg Oral QHS  . metoprolol succinate  12.5 mg Oral Daily    ROS: History obtained from the patient   General ROS: negative for - chills, fatigue, fever, night sweats, weight gain or weight loss Psychological ROS: negative for - behavioral disorder, hallucinations, memory difficulties, mood swings or suicidal ideation Ophthalmic ROS: negative for - blurry vision, double vision, eye pain or loss of vision ENT ROS: negative for - epistaxis, nasal discharge, oral lesions, sore throat, tinnitus or vertigo Allergy and Immunology ROS: negative for - hives or itchy/watery eyes Hematological and Lymphatic ROS: negative for - bleeding problems, bruising or swollen lymph nodes Endocrine ROS: negative for - galactorrhea, hair pattern changes, polydipsia/polyuria or temperature  intolerance Respiratory ROS: negative for - cough, hemoptysis, shortness of breath or wheezing Cardiovascular ROS: negative for - chest pain, dyspnea on exertion, edema or irregular heartbeat Gastrointestinal ROS: negative for - abdominal pain, diarrhea, hematemesis, nausea/vomiting or stool incontinence Genito-Urinary ROS: negative for - dysuria, hematuria, incontinence or urinary frequency/urgency Musculoskeletal ROS: negative for - joint swelling or muscular weakness Neurological ROS: as noted in HPI Dermatological ROS: negative for rash and skin lesion changes  Physical Examination: Blood pressure (!) 159/69, pulse 68, temperature 99.3 F (37.4 C), temperature source Oral, resp. rate 20, height 5\' 6"  (1.676 m), weight 81.6 kg, SpO2 94 %.   HEENT-  Normocephalic, no lesions, without obvious abnormality.  Normal external eye and conjunctiva.  Normal TM's bilaterally.  Normal auditory canals and external ears. Normal external nose, mucus membranes and septum.  Normal pharynx. Cardiovascular- S1, S2 normal, pulses palpable throughout   Lungs- chest clear, no wheezing, rales, normal symmetric air entry Abdomen- soft, non-tender; bowel sounds normal; no masses,  no organomegaly Extremities- no edema Lymph-no adenopathy palpable Musculoskeletal-no joint tenderness, deformity or swelling Skin-warm and dry, no hyperpigmentation, vitiligo, or suspicious lesions  Neurological Exam   Mental Status: Alert, oriented, thought content appropriate.  Speech fluent without evidence of aphasia.  Able to follow 3 step commands without difficulty. Attention span and concentration seemed appropriate  Cranial Nerves: II: Discs flat bilaterally; bilateral homonymous hemianopia, pupils equal, round, reactive to light and accommodation III,IV, VI: ptosis not present, extra-ocular motions intact bilaterally V,VII: smile symmetric, facial light touch sensation intact VIII: hearing normal bilaterally IX,X: gag  reflex present XI: bilateral shoulder shrug XII: midline tongue extension Motor: Right :  Upper extremity   4+/5 Without pronator drift      Left: Upper extremity   5/5 without pronator drift Right:   Lower extremity   4+/5                                          Left: Lower extremity   5/5 Tone and bulk:normal tone throughout; no atrophy noted Sensory: Pinprick and light touch intact bilaterally Deep Tendon Reflexes: 2+ and symmetric throughout Plantars: Right: mute  Left: mute Cerebellar: Finger-to-nose testing intact bilaterally. Heel to shin testing normal bilaterally Gait: not tested due to safety concerns  Data Reviewed  Laboratory Studies:  Basic Metabolic Panel: Recent Labs  Lab 10/09/18 2229 10/10/18 0318  NA 138  --   K 3.6  --   CL 105  --   CO2 26  --   GLUCOSE 187*  --   BUN 16  --   CREATININE 0.93  --   CALCIUM 8.6*  --   MG  --  2.0  PHOS  --  3.7    Liver Function Tests: Recent Labs  Lab 10/10/18 0318  AST 21  ALT 20  ALKPHOS 58  BILITOT 0.5  PROT 6.2*  ALBUMIN 3.6   No results for input(s): LIPASE, AMYLASE in the last 168 hours. No results for input(s): AMMONIA in the last 168 hours.  CBC: Recent Labs  Lab 10/09/18 2229  WBC 6.6  NEUTROABS 3.1  HGB 13.4  HCT 40.3  MCV 96.9  PLT 189    Cardiac Enzymes: Recent Labs  Lab 10/09/18 2229  TROPONINI <0.03    BNP: Invalid input(s): POCBNP  CBG: No results for input(s): GLUCAP in the last 168 hours.  Microbiology: Results for orders placed or performed during the hospital encounter of 05/22/18  MRSA PCR Screening     Status: None   Collection Time: 05/22/18 12:30 PM  Result Value Ref Range Status   MRSA by PCR NEGATIVE NEGATIVE Final    Comment:        The GeneXpert MRSA Assay (FDA approved for NASAL specimens only), is one component of a comprehensive MRSA colonization surveillance program. It is not intended to diagnose MRSA infection nor to  guide or monitor treatment for MRSA infections. Performed at [facility], [address] [place], [place] [address]   CULTURE, BLOOD (ROUTINE X 2) w Reflex to ID Panel     Status: None   Collection Time: 05/26/18 11:18 AM  Result Value Ref Range Status   Specimen Description BLOOD RT [facility]  Final   Special Requests   Final    BOTTLES DRAWN AEROBIC AND ANAEROBIC Blood Culture adequate volume   Culture   Final    NO GROWTH 5 DAYS Performed at [facility], [address] [place], [place] [address]    Report Status 05/31/2018 FINAL  Final  CULTURE, BLOOD (ROUTINE X 2) w Reflex to ID Panel     Status: None   Collection Time: 05/26/18 11:33 AM  Result Value Ref Range Status   Specimen Description BLOOD RT HAND  Final   Special Requests   Final    BOTTLES DRAWN AEROBIC AND ANAEROBIC Blood Culture adequate volume   Culture   Final    NO GROWTH 5 DAYS Performed at [facility], [address], [place], [place] [address]    Report Status 05/31/2018 FINAL  Final  Urine Culture     Status: None   Collection Time: 05/26/18  1:55 PM  Result Value Ref Range Status   Specimen Description   Final    URINE, RANDOM Performed at [facility], [address] [place], [place] [address]    Special Requests   Final    NONE Performed at [facility], [address] [place], [place] [address]    Culture   Final    NO GROWTH Performed at [facility], [place] [address], [place],  [address]  Report Status 05/27/2018 FINAL  Final   Coagulation Studies: Recent Labs    10/09/18 26-Jan-2228  LABPROT 13.6  INR 1.05    Urinalysis:  Recent Labs  Lab 10/09/18 2350  COLORURINE RED*  LABSPEC 1.014  PHURINE 7.0  GLUCOSEU 50*  HGBUR LARGE*  BILIRUBINUR NEGATIVE  KETONESUR NEGATIVE  PROTEINUR 100*  NITRITE NEGATIVE  LEUKOCYTESUR TRACE*   Lipid Panel:    Component Value Date/Time   CHOL 159 04/16/2018 0558   CHOL 129 08/13/2013  1026   TRIG 288 (H) 04/16/2018 0558   TRIG 166 08/13/2013 1026   HDL 35 (L) 04/16/2018 0558   HDL 47 08/13/2013 1026   CHOLHDL 4.5 04/16/2018 0558   VLDL 58 (H) 04/16/2018 0558   VLDL 33 08/13/2013 1026   LDLCALC 66 04/16/2018 0558   LDLCALC 49 08/13/2013 1026    HgbA1C:  Lab Results  Component Value Date   HGBA1C 6.8 (H) 04/16/2018   Urine Drug Screen:  No results found for: LABOPIA, COCAINSCRNUR, LABBENZ, AMPHETMU, THCU, LABBARB  Alcohol Level: No results for input(s): ETH in the last 168 hours.  Other results: EKG: normal EKG, normal sinus rhythm, unchanged from previous tracings. Vent. rate 63 BPM PR interval * ms QRS duration 101 ms QT/QTc 461/472 ms P-R-T axes -21 81 117 Imaging: Ct Head Wo Contrast  Result Date: 10/09/2018 CLINICAL DATA:  Vision loss EXAM: CT HEAD WITHOUT CONTRAST TECHNIQUE: Contiguous axial images were obtained from the base of the skull through the vertex without intravenous contrast. COMPARISON:  05/23/2018 FINDINGS: Brain: There is no mass, hemorrhage or extra-axial collection. Generalized atrophy with ex vacuo dilatation of the occipital horn of left lateral ventricle. Old left PCA territory infarct. There is periventricular hypoattenuation compatible with chronic microvascular disease. Vascular: Atherosclerotic calcification of the internal carotid arteries at the skull base. No abnormal hyperdensity of the major intracranial arteries or dural venous sinuses. Skull: The visualized skull base, calvarium and extracranial soft tissues are normal. Sinuses/Orbits: No fluid levels or advanced mucosal thickening of the visualized paranasal sinuses. No mastoid or middle ear effusion. The orbits are normal. IMPRESSION: 1. No acute intracranial abnormality. 2. 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 Electronically Signed   By: Ulyses Jarred M.D.   On: 10/09/2018 23:12   Assessment: 85 y.o. 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  Concerns for posterior circulation stroke or  possible PRES. Patient report that he was anticoagulation Eliquis and dual antiplatelet with Aspirin 81 and 75 mg once a day prior to this event.  Stroke Risk Factors - atrial fibrillation, carotid stenosis, diabetes mellitus, hyperlipidemia and hypertension  Plan: 1. HgbA1c, fasting lipid panel 2. MRI of the brain without contrast 3. PT consult, OT consult, Speech consult 4. Echocardiogram pending 5. CTA head and neck 6. Prophylactic therapy-Antiplatelet med: Aspirin - dose 81 mg/day, Antiplatelet med: Plavix - dose 75 mg.day and Anticoagulation: Eliquis- dose 5 mg BID 7. NPO until RN stroke swallow screen 8. Telemetry monitoring 9. Frequent neuro checks   Addendum: MRI has new R occipital infarct with old L PCA infarct Pt has very poor posterior circulation  I will d/c metoprolol for permissive htn SBP goal <180  Platelet function assay sent    10/10/2018, 12:18 PM

## 2018-10-10 NOTE — Care Management Obs Status (Signed)
[facility] NOTIFICATION   Patient Details  Name: Kyle Richardson MRN: [id number] Date of Birth: [birth year]   Medicare Observation Status Notification Given:  Yes    Shelbie Ammons, RN 10/10/2018, 10:38 AM

## 2018-10-10 NOTE — Care Management Note (Signed)
Case Management Note  Patient Details  Name: Kyle Richardson MRN: [id number] Date of Birth: [birth year]  Subjective/Objective:                  Admitted to [facility] under observation status with the diagnosis of CVA. Lives with spouse, [name], [phone]). Last seen Dr, Einar Pheasant 08/16/18. Prescriptions are [facility] at [facility].  [facility] in the past. [facility] 05/2018. Acute Inpatient Rehab at [facility] in the past. Outpatient Rehabilitation 09/24/18. No home oxygen Wheelchair, rolling walker, shower chair, bedside commode, and raised toilet seat in the home.  Last fall was [facility]. Self feed, needs help with baths and dressing.  Daughter Jackelyn Poling at the bedside,.  Action/Plan: Will continue to follow for transition of care needs   Expected Discharge Date:                  Expected Discharge Plan:     In-House Referral:   yes  Discharge planning Services   yes  Post Acute Care Choice:    Choice offered to:     DME Arranged:    DME Agency:     HH Arranged:    HH Agency:     Status of Service:     If discussed at [employer] of Stay Meetings, dates discussed:    Additional Comments:  Shelbie Ammons, RN MSN CCM Care Management [phone] 10/10/2018, 10:54 AM

## 2018-10-10 NOTE — Evaluation (Signed)
Clinical/Bedside Swallow Evaluation Patient Details  Name: Kyle Richardson MRN: [id number] Date of Birth: [birth year]  Today's Date: 10/10/2018 Time: SLP Start Time (ACUTE ONLY): 1419 SLP Stop Time (ACUTE ONLY): 1445 SLP Time Calculation (min) (ACUTE ONLY): 26 min  Past Medical History:  Past Medical History:  Diagnosis Date  . Abdominal fibromatosis   . 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  s/p PTCA of the mid RCA 1/96, also  s/p  CABG x 4 7/96  . Complication of anesthesia    confusion and hallucinations for several days after receiving Versed  . Degenerative disc disease   . Diabetes mellitus ([place])   . Glaucoma   . Hypercholesterolemia   . Hypertension   . Migraine headache    history of  . Myocardial infarction ([place]) 1996  . Osteoarthritis   . Sleep apnea   . Stroke [facility]) 02/22/2017   affected left side    Past Surgical History:  Past Surgical History:  Procedure Laterality Date  . APPENDECTOMY    . CAROTID ANGIOGRAPHY Bilateral 04/19/2018   Procedure: CAROTID ANGIOGRAPHY;  Surgeon: Algernon Huxley, MD;  Location: [facility];  Service: Cardiovascular;  Laterality: Bilateral;  . CAROTID PTA/STENT INTERVENTION Right 05/22/2018   Procedure: CAROTID PTA/STENT INTERVENTION;  Surgeon: Katha Cabal, MD;  Location: [facility];  Service: Cardiovascular;  Laterality: Right;  . CATARACT EXTRACTION W/ INTRAOCULAR LENS  IMPLANT, BILATERAL    . CHOLECYSTECTOMY     Removed  . CORONARY ARTERY BYPASS GRAFT  7/96   x4  . HEMORRHOID SURGERY    . HERNIA REPAIR    . TONSILLECTOMY    . UPP and tonsillectomy  1/86   HPI:  Per current HP, "Kyle Richardson  is a 85 y.o. male with a known history of L nephrolithiasis/obstruction (s/p L ureteral stent 08/05/2018), R carotid stents (05/22/2018), CAD/MI (s/p CABG, stent), CVA (w/ residual R weakness; ASA + Plavix), Afib (Eliquis), HTN, HLD p/w acute sudden-onset B/L loss of vision. Pt is  altered, AAOx1 (to self only) and cannot provide Hx or answer questions. His daughter provides Hx at bedside. She states that pt was in his usual state of health until 10/09/2018 evening. Pt experienced sudden-onset complete B/L vision loss @~2050PM. His family brought him to the hospital. He is able to see me during the exam; he points at me, and asks his son-in-law, "Who is this guy?" He is not able to cooperate for a thorough neurological exam. His daughter states he has expressive aphasia/word finding difficulty at times, but was able to carry on a conversation at home during the day. She states he does exhibit some intermittent confusion during the day, as well as sundowning. He is hard of hearing at baseline. His daughter states he is typically AAOx3 at baseline, but she may be understating his level of cognitive decline; pt's son-in-law says the pt has been declining significantly in the past 14mo or so."   Assessment / Plan / Recommendation Clinical Impression  Patient appears to present with functional swallowing abilities at bedside, however swallow safety impacted by altered mental status and intermittent vision problems. No apparent s/s aspiration observed at bedside with any consistency tested. Oral phase WFL. Adequate oral prep and coordination, timely A-P transit, no oral residue noted. Oral-motor exam showed generalized lingual and facial weakness that did not appear to functionally impact swallow. Patient tolerated thin, puree, and solid trials with no s/s aspiration. He was  unable to self-feed d/t poor vision; patient attempted to sip from a cup but brought the cup too far to the left of his face. Patient required multiple cues to follow directions, likely impacted by hearing loss as well as confusion. Family member reports no observation of difficulty with intake and denies s/s aspiration during or prior to admission. Recommend regular diet with thin liquids, meds may be given whole in puree or  with thin liquid per pt/RN preference. Patient will need FULL ASSISTANCE during all intake due to confusion and vision deficits. Reviewed diet and swallow rec's with nursing. Nsg in agreement. Educated patient and family member re: general aspiration and safe swallow precautions (sit up right during meals, minimize environmental distractions during meals, slow rate, small bites), who stated understanding and agreement. ST will f/u for toleration of diet in 1-3 days. SLP Visit Diagnosis: Dysphagia, unspecified (R13.10)    Aspiration Risk  Mild aspiration risk    Diet Recommendation Regular;Thin liquid   Liquid Administration via: Cup;Other (Comment)(Full assist d/t vision and cognition issues) Medication Administration: Whole meds with liquid Supervision: Full supervision/cueing for compensatory strategies Compensations: Minimize environmental distractions;Slow rate;Small sips/bites Postural Changes: Seated upright at 90 degrees    Other  Recommendations Oral Care Recommendations: Oral care BID   Follow up Recommendations Other (comment)(f/u diet tolerance)      Frequency and Duration min 2x/week  1 week       Prognosis Prognosis for Safe Diet Advancement: Good Barriers to Reach Goals: Cognitive deficits;Severity of deficits      Swallow Study   General Date of Onset: 10/10/18 HPI: Per current HP, "Kyle Richardson  is a 85 y.o. male with a known history of L nephrolithiasis/obstruction (s/p L ureteral stent 08/05/2018), R carotid stents (05/22/2018), CAD/MI (s/p CABG, stent), CVA (w/ residual R weakness; ASA + Plavix), Afib (Eliquis), HTN, HLD p/w acute sudden-onset B/L loss of vision. Pt is altered, AAOx1 (to self only) and cannot provide Hx or answer questions. His daughter provides Hx at bedside. She states that pt was in his usual state of health until 10/09/2018 evening. Pt experienced sudden-onset complete B/L vision loss @~2050PM. His family brought him to the hospital. He is able  to see me during the exam; he points at me, and asks his son-in-law, "Who is this guy?" He is not able to cooperate for a thorough neurological exam. His daughter states he has expressive aphasia/word finding difficulty at times, but was able to carry on a conversation at home during the day. She states he does exhibit some intermittent confusion during the day, as well as sundowning. He is hard of hearing at baseline. His daughter states he is typically AAOx3 at baseline, but she may be understating his level of cognitive decline; pt's son-in-law says the pt has been declining significantly in the past 21mo or so." Type of Study: Bedside Swallow Evaluation Previous Swallow Assessment: None Diet Prior to this Study: NPO Temperature Spikes Noted: No Respiratory Status: Room air History of Recent Intubation: No Behavior/Cognition: Cooperative;Confused;Requires cueing Oral Cavity Assessment: Within Functional Limits Oral Care Completed by SLP: No Oral Cavity - Dentition: Adequate natural dentition Vision: Impaired for self-feeding Self-Feeding Abilities: Total assist Patient Positioning: Upright in bed Baseline Vocal Quality: Hoarse;Low vocal intensity    Oral/Motor/Sensory Function Overall Oral Motor/Sensory Function: Mild impairment Facial ROM: Reduced right;Reduced left Facial Symmetry: Within Functional Limits Facial Strength: Reduced right;Reduced left Lingual ROM: Reduced right;Reduced left Lingual Strength: Reduced   Ice Chips Ice chips: Within functional limits  Presentation: Spoon   Thin Liquid Thin Liquid: Within functional limits Presentation: Cup;Spoon    Nectar Thick Nectar Thick Liquid: Not tested   Honey Thick Honey Thick Liquid: Not tested   Puree Puree: Within functional limits   Solid     Solid: Within functional limits      Kaizlee Carlino, MA, CCC-SLP 10/10/2018,2:54 PM

## 2018-10-11 DIAGNOSIS — E1165 Type 2 diabetes mellitus with hyperglycemia: Secondary | ICD-10-CM | POA: Diagnosis present

## 2018-10-11 DIAGNOSIS — F05 Delirium due to known physiological condition: Secondary | ICD-10-CM | POA: Diagnosis present

## 2018-10-11 DIAGNOSIS — I480 Paroxysmal atrial fibrillation: Secondary | ICD-10-CM | POA: Diagnosis present

## 2018-10-11 DIAGNOSIS — I639 Cerebral infarction, unspecified: Secondary | ICD-10-CM | POA: Diagnosis present

## 2018-10-11 DIAGNOSIS — I2510 Atherosclerotic heart disease of native coronary artery without angina pectoris: Secondary | ICD-10-CM | POA: Diagnosis present

## 2018-10-11 DIAGNOSIS — I69398 Other sequelae of cerebral infarction: Secondary | ICD-10-CM | POA: Diagnosis not present

## 2018-10-11 DIAGNOSIS — E785 Hyperlipidemia, unspecified: Secondary | ICD-10-CM | POA: Diagnosis present

## 2018-10-11 DIAGNOSIS — F0150 Vascular dementia without behavioral disturbance: Secondary | ICD-10-CM | POA: Diagnosis present

## 2018-10-11 DIAGNOSIS — H53461 Homonymous bilateral field defects, right side: Secondary | ICD-10-CM | POA: Diagnosis present

## 2018-10-11 DIAGNOSIS — I6389 Other cerebral infarction: Secondary | ICD-10-CM | POA: Diagnosis present

## 2018-10-11 DIAGNOSIS — H9190 Unspecified hearing loss, unspecified ear: Secondary | ICD-10-CM | POA: Diagnosis present

## 2018-10-11 DIAGNOSIS — H409 Unspecified glaucoma: Secondary | ICD-10-CM | POA: Diagnosis present

## 2018-10-11 DIAGNOSIS — I69351 Hemiplegia and hemiparesis following cerebral infarction affecting right dominant side: Secondary | ICD-10-CM | POA: Diagnosis not present

## 2018-10-11 DIAGNOSIS — I6522 Occlusion and stenosis of left carotid artery: Secondary | ICD-10-CM | POA: Diagnosis present

## 2018-10-11 DIAGNOSIS — Z9861 Coronary angioplasty status: Secondary | ICD-10-CM | POA: Diagnosis not present

## 2018-10-11 DIAGNOSIS — G4733 Obstructive sleep apnea (adult) (pediatric): Secondary | ICD-10-CM | POA: Diagnosis present

## 2018-10-11 DIAGNOSIS — I10 Essential (primary) hypertension: Secondary | ICD-10-CM | POA: Diagnosis present

## 2018-10-11 DIAGNOSIS — E7800 Pure hypercholesterolemia, unspecified: Secondary | ICD-10-CM | POA: Diagnosis present

## 2018-10-11 DIAGNOSIS — R319 Hematuria, unspecified: Secondary | ICD-10-CM | POA: Diagnosis present

## 2018-10-11 DIAGNOSIS — Z95820 Peripheral vascular angioplasty status with implants and grafts: Secondary | ICD-10-CM | POA: Diagnosis not present

## 2018-10-11 DIAGNOSIS — G9349 Other encephalopathy: Secondary | ICD-10-CM | POA: Diagnosis present

## 2018-10-11 DIAGNOSIS — R29703 NIHSS score 3: Secondary | ICD-10-CM | POA: Diagnosis present

## 2018-10-11 DIAGNOSIS — E1151 Type 2 diabetes mellitus with diabetic peripheral angiopathy without gangrene: Secondary | ICD-10-CM | POA: Diagnosis present

## 2018-10-11 DIAGNOSIS — H53133 Sudden visual loss, bilateral: Secondary | ICD-10-CM | POA: Diagnosis present

## 2018-10-11 LAB — ECHOCARDIOGRAM COMPLETE
Height: 66 in
Weight: 2880 oz

## 2018-10-11 LAB — URINE CULTURE: Culture: <10000 — AB

## 2018-10-11 LAB — HEMOGLOBIN: Hemoglobin: 14.4 g/dL (ref 13.0–17.0)

## 2018-10-11 LAB — CALCIUM, IONIZED: Calcium, Ionized, Serum: 4.9 mg/dL (ref 4.5–5.6)

## 2018-10-11 MED ORDER — POLYETHYLENE GLYCOL 3350 17 G PO PACK
17.0000 g | PACK | Freq: Once | ORAL | Status: DC
  Filled 2018-10-11: qty 1

## 2018-10-11 NOTE — Evaluation (Signed)
Physical Therapy Evaluation Patient Details Name: Kyle Richardson MRN: [id number] DOB: 1928/08/09 Today's Date: 10/11/2018   History of Present Illness  Pt is a 85 y/o M who presented with acute sudden onset Bil loss of vision.  CT head performed in the ED and was negative for acute intracranial abnormality.  MRI brain showed acute/subacute infarct of R occipital lobe and L PCA distribuation infarct.  CTA head and neck showed chronic severe proximal left PCA stenosis and new severe stenosis at the right vertebrobasilar junction.  Echocardiogram did not show cardiac source of emboli.  Pt's PMH includes CABG, CAD, diabetes mellitus, glaucoma, hyperlipidemia, hypertension, MI, migraine headaches, left PCA infarct with residual weakness of right arm and right leg, and right homonymous hemianopsia, right ICA stenosis status post stenting and paroxysmal atrial fibrillation on Eliquis.    Clinical Impression  Pt admitted with above diagnosis. Pt currently with functional limitations due to the deficits listed below (see PT Problem List). PTA pt was living at home with 24/7 assist/supervision for some ADLs and supervision to ambulate with RW.  Upon PT evaluation this date pt presents with significantly impaired cognition compared to his baseline. Daughter reports that his cognition has been fluctuating since he received lorazepam and valium to have MRI done.  She reports this happens every time he receives any amount of these medications.  Hopeful that pt's cognition will improve, as it has historically under similar circumstances, and at that time will recommend OT evaluation and assess potential for CIR at d/c.  Pt currently requires mod>max assist for bed mobility which is not his baseline.  Pt will benefit from skilled PT to increase their independence and safety with mobility to allow discharge to the venue listed below.      Follow Up Recommendations CIR    Equipment Recommendations  Other (comment)(TBD as  pt progresses)    Recommendations for Other Services       Precautions / Restrictions Precautions Precautions: Fall Restrictions Weight Bearing Restrictions: No      Mobility  Bed Mobility Overal bed mobility: Needs Assistance Bed Mobility: Supine to Sit;Sit to Supine     Supine to sit: Mod assist Sit to supine: Max assist   General bed mobility comments: Tactile and verbal cues provided with assist provided for all aspects of mobility.  Pt does assist once motion initiated.    Transfers                 General transfer comment: Not safe to attempt at this time.  Ambulation/Gait                Stairs            Wheelchair Mobility    Modified Rankin (Stroke Patients Only) Modified Rankin (Stroke Patients Only) Pre-Morbid Rankin Score: Moderately severe disability Modified Rankin: Severe disability     Balance Overall balance assessment: Needs assistance Sitting-balance support: Bilateral upper extremity supported;Feet supported;Feet unsupported Sitting balance-Leahy Scale: Poor Sitting balance - Comments: Pt does not consistently leave feet planted on floor despite verbal and tactile cues, resulting in exaggeration of pt's posterior lean.  Pt fluctuates between requiring min>mod assist to min guard with BUE support to maintain balance sitting EOB.  Pt very confused and frequently saying, "I don't know what you want me to do". Postural control: Posterior lean  Pertinent Vitals/Pain Pain Assessment: Faces Faces Pain Scale: No hurt Pain Location: no signs of pain Pain Intervention(s): Monitored during session    [place] expects to be discharged to:: Private residence Living Arrangements: Children(daughter) Available Help at Discharge: Family;Available 24 hours/day Type of Home: House Home Access: Ramped entrance     Home Layout: One level Home Equipment: Grab bars -  tub/shower;Wheelchair - [employer] - 2 wheels;Bedside commode;Tub bench;Hand held shower head;Grab bars - toilet;Toilet riser      Prior Function Level of Independence: Needs assistance   Gait / Transfers Assistance Needed: Pt ambulating distance to mailbox and back with supervision PTA.  No recent falls.  Pt requires assist for transfer into/out of shower.    ADL's / Homemaking Assistance Needed: 24/7 aide assists with housekeeping, lower body dressing, shower transfers (pt supervision level once in shower).          Hand Dominance   Dominant Hand: Right    Extremity/Trunk Assessment   Upper Extremity Assessment Upper Extremity Assessment: Difficult to assess due to impaired cognition(Functionally at least 3/5)    Lower Extremity Assessment Lower Extremity Assessment: Difficult to assess due to impaired cognition(Functionally at least 3/5)       Communication   Communication: Expressive difficulties(difficult to assess in depth due to confusion)  Cognition Arousal/Alertness: Awake/alert Behavior During Therapy: Anxious;Restless Overall Cognitive Status: Impaired/Different from baseline Area of Impairment: Orientation;Attention;Memory;Following commands;Safety/judgement;Awareness;Problem solving                 Orientation Level: Disoriented to;Place;Time;Situation Current Attention Level: Focused Memory: Decreased short-term memory Following Commands: Follows one step commands inconsistently Safety/Judgement: Decreased awareness of safety;Decreased awareness of deficits Awareness: Intellectual Problem Solving: Slow processing;Decreased initiation;Difficulty sequencing;Requires verbal cues;Requires tactile cues General Comments: Pt able to intermittently follow one step commands such as "raise your arm".  He demonstrates neglect, only looking toward the L when verbal cues provided to look L (barely past midline).  Pt demonstrates echolalia followed by perseveration  on what was said.  Unable to determine extent of visual impairment as pt unable to follow commands or respond to questions appropriately. Daughter reports that his cognition has been fluctuating since he received lorazepam and valium to have MRI done.  She reports this happens every time he receives any amount of these medications.       General Comments General comments (skin integrity, edema, etc.): Daughter present during evaluation and provided information regarding PLOF and home layout.     Exercises     Assessment/Plan    PT Assessment Patient needs continued PT services  PT Problem List Decreased strength;Decreased balance;Decreased activity tolerance;Decreased mobility;Decreased coordination;Decreased cognition;Decreased knowledge of use of DME;Decreased safety awareness       PT Treatment Interventions DME instruction;Gait training;Stair training;Functional mobility training;Therapeutic activities;Therapeutic exercise;Balance training;Neuromuscular re-education;Cognitive remediation;Patient/family education;Wheelchair mobility training    PT Goals (Current goals can be found in the Care Plan section)  Acute Rehab PT Goals Patient Stated Goal: Per daughter, to return to PLOF PT Goal Formulation: With family Time For Goal Achievement: 10/25/18 Potential to Achieve Goals: Fair    Frequency 7X/week   Barriers to discharge        Co-evaluation               AM-PAC PT "6 Clicks" Mobility  Outcome Measure Help needed turning from your back to your side while in a flat bed without using bedrails?: Total Help needed moving from lying on your back to sitting on the side of  a flat bed without using bedrails?: Total Help needed moving to and from a bed to a chair (including a wheelchair)?: Total Help needed standing up from a chair using your arms (e.g., wheelchair or bedside chair)?: Total Help needed to walk in hospital room?: Total Help needed climbing 3-5 steps with a  railing? : Total 6 Click Score: 6    End of Session   Activity Tolerance: Other (comment)(limited by confusion) Patient left: in bed;with call bell/phone within reach;with family/visitor present Nurse Communication: Mobility status;Other (comment)(bed alarm not on, needs to be fixed, daughter in room) PT Visit Diagnosis: Muscle weakness (generalized) (M62.81);Unsteadiness on feet (R26.81);Other abnormalities of gait and mobility (R26.89);Difficulty in walking, not elsewhere classified (R26.2)    Time: 2694-8546 PT Time Calculation (min) (ACUTE ONLY): 31 min   Charges:   PT Evaluation $PT Eval Moderate Complexity: 1 Mod PT Treatments $Therapeutic Activity: 8-22 mins        Collie Siad PT, DPT 10/11/2018, 3:20 PM

## 2018-10-11 NOTE — Progress Notes (Signed)
Subjective: No new stroke or strokelike symptoms reported.  Vision disturbance persistent however does not appear to be worsening or improving per daughter who is currently at bedside.  Objective: Current vital signs: BP 130/83 (BP Location: Left Arm)   Pulse 61   Temp 98 F (36.7 C) (Oral)   Resp 18   Ht 5\' 6"  (1.676 m)   Wt 81.6 kg   SpO2 99%   BMI 29.05 kg/m  Vital signs in last 24 hours: Temp:  [97.4 F (36.3 C)-98.5 F (36.9 C)] 98 F (36.7 C) (01/03 1044) Pulse Rate:  [54-116] 61 (01/03 1044) Resp:  [18-19] 18 (01/03 1044) BP: (130-159)/(47-83) 130/83 (01/03 1044) SpO2:  [96 %-99 %] 99 % (01/03 1044)  Intake/Output from previous day: 01/02 0701 - 01/03 0700 In: 100 [IV Piggyback:100] Out: 675 [Urine:675] Intake/Output this shift: Total I/O In: -  Out: 200 [Urine:200] Nutritional status:  Diet Order            Diet regular Room service appropriate? Yes; Fluid consistency: Thin  Diet effective now             Neurologic Exam:  Mental Status: Alert, oriented, thought content appropriate. Speech fluent without evidence of aphasia. Able to follow 3 step commands without difficulty. Attention span and concentration seemed appropriate  Cranial Nerves: II: Discs flat bilaterally; bilateral homonymous hemianopia, pupils equal, round, reactive to light and accommodation III,IV, VI: ptosis not present, extra-ocular motions intact bilaterally V,VII: smile symmetric, facial light touch sensationintact VIII: hearing normal bilaterally IX,X: gag reflex present XI: bilateral shoulder shrug XII: midline tongue extension Motor: Right :Upper extremity 4+/5Without pronator driftLeft: Upper extremity 5/5 without pronator drift Right:Lower extremity 4+/5Left: Lower extremity 5/5 Tone and bulk:normal tone throughout; no atrophy noted Sensory: Pinprick and light touchintact bilaterally Deep Tendon Reflexes: 2+  and symmetric throughout Plantars: Right:muteLeft: mute Cerebellar: Finger-to-nosetesting intact bilaterally.Heel to shin testing normal bilaterally Gait: not tested due to safety concerns  Lab Results: Basic Metabolic Panel: Recent Labs  Lab 10/09/18 2229 10/10/18 0318  NA 138  --   K 3.6  --   CL 105  --   CO2 26  --   GLUCOSE 187*  --   BUN 16  --   CREATININE 0.93  --   CALCIUM 8.6*  --   MG  --  2.0  PHOS  --  3.7    Liver Function Tests: Recent Labs  Lab 10/10/18 0318  AST 21  ALT 20  ALKPHOS 58  BILITOT 0.5  PROT 6.2*  ALBUMIN 3.6   No results for input(s): LIPASE, AMYLASE in the last 168 hours. No results for input(s): AMMONIA in the last 168 hours.  CBC: Recent Labs  Lab 10/09/18 2229  WBC 6.6  NEUTROABS 3.1  HGB 13.4  HCT 40.3  MCV 96.9  PLT 189    Cardiac Enzymes: Recent Labs  Lab 10/09/18 2229  TROPONINI <0.03    Lipid Panel: No results for input(s): CHOL, TRIG, HDL, CHOLHDL, VLDL, LDLCALC in the last 168 hours.  CBG: No results for input(s): GLUCAP in the last 168 hours.  Microbiology: Results for orders placed or performed during the hospital encounter of 10/09/18  Urine Culture     Status: Abnormal   Collection Time: 10/09/18 11:50 PM  Result Value Ref Range Status   Specimen Description   Final    URINE, RANDOM Performed at [facility], [address] [place], [place] [address]    Special Requests  Final    NONE Performed at [facility], [address], [place], [place] [address]    Culture (A)  Final    <10,000 COLONIES/mL INSIGNIFICANT GROWTH Performed at [facility] [address] [place], [place] [address]    Report Status 10/11/2018 FINAL  Final    Coagulation Studies: Recent Labs    10/09/18 Jan 25, 2228  LABPROT 13.6  INR 1.05    Imaging: Ct Angio Head W Or Wo Contrast  Result Date: 10/10/2018 CLINICAL DATA:  Acute vision loss. Acute right PCA  infarct on limited MRI today. EXAM: CT ANGIOGRAPHY HEAD AND NECK TECHNIQUE: Multidetector CT imaging of the head and neck was performed using the standard protocol during bolus administration of intravenous contrast. Multiplanar CT image reconstructions and MIPs were obtained to evaluate the vascular anatomy. Carotid stenosis measurements (when applicable) are obtained utilizing NASCET criteria, using the distal internal carotid diameter as the denominator. CONTRAST:  37mL OMNIPAQUE IOHEXOL 350 MG/ML SOLN COMPARISON:  Neck CTA 04/16/2018.  Head MRA 04/15/2018. FINDINGS: The study suffers from intermittent, up to severe motion artifact due to patient agitation. CTA NECK FINDINGS Aortic arch: Standard 3 vessel aortic arch with moderate atherosclerotic plaque. No significant arch vessel origin stenosis. Partially visualized mild chronic aneurysmal dilatation of the ascending aorta, not progressive from the prior CTA as imaged today. Prominent atherosclerotic plaque in the left subclavian artery just proximal to the left vertebral artery origin resulting in moderate to severe stenosis, similar to the prior CTA. Right carotid system: Proximal common carotid artery is obscured by streak artifact from venous contrast. New stents are present in the more distal common carotid artery and proximal ICA, grossly patent though with assessment for residual/recurrent stenosis precluded by severe motion artifact. Left carotid system: Patent with calcified plaque at the carotid bifurcation. Reassessment of known stenosis precluded by severe motion artifact. Vertebral arteries: The vertebral arteries are patent and codominant. At most mild stenosis of the vertebral artery origins. Skeleton: Severe left-sided cervical facet arthrosis. Severe disc degeneration at C6-7. Other neck: No gross acute abnormality or mass. Upper chest: Grossly clear lung apices.  Prior CABG. Review of the MIP images confirms the above findings CTA HEAD  FINDINGS Anterior circulation: Motion artifact precludes assessment of the majority of the intracranial ICAs. The distal supraclinoid ICAs and ICA termini are widely patent bilaterally. ACAs and MCAs are patent without evidence of proximal branch occlusion. The left A1 segment is hypoplastic. No aneurysm is identified. Posterior circulation: The intracranial vertebral arteries are patent with artifact versus severe stenosis of the right vertebrobasilar junction. No significant stenosis was present in this location previously which may favor artifact. The basilar artery is grossly patent with severe motion artifact through its midportion. The right PCA is patent without significant proximal stenosis. There is a chronic severe left P1-P2 junction stenosis. No aneurysm is identified. Venous sinuses: As permitted by contrast timing and motion artifact, patent. Anatomic variants: Hypoplastic left A1. Delayed phase: Not performed as the patient was agitated and trying to get off of the CT table. Review of the MIP images confirms the above findings IMPRESSION: 1. Severely motion degraded examination. 2. Patent posterior circulation with chronic severe proximal left PCA stenosis. 3. Artifact versus new severe stenosis at the right vertebrobasilar junction. 4. Grossly patent carotid arteries with interval right carotid stenting. Nondiagnostic assessment for stenosis or dissection. 5. Similar appearance of moderate to severe left subclavian artery stenosis just proximal to the vertebral origin. 6.  Aortic Atherosclerosis (ICD10-I70.0). Electronically  Signed   By: Logan Bores M.D.   On: 10/10/2018 16:25   Ct Head Wo Contrast  Result Date: 10/09/2018 CLINICAL DATA:  Vision loss EXAM: CT HEAD WITHOUT CONTRAST TECHNIQUE: Contiguous axial images were obtained from the base of the skull through the vertex without intravenous contrast. COMPARISON:  05/23/2018 FINDINGS: Brain: There is no mass, hemorrhage or extra-axial  collection. Generalized atrophy with ex vacuo dilatation of the occipital horn of left lateral ventricle. Old left PCA territory infarct. There is periventricular hypoattenuation compatible with chronic microvascular disease. Vascular: Atherosclerotic calcification of the internal carotid arteries at the skull base. No abnormal hyperdensity of the major intracranial arteries or dural venous sinuses. Skull: The visualized skull base, calvarium and extracranial soft tissues are normal. Sinuses/Orbits: No fluid levels or advanced mucosal thickening of the visualized paranasal sinuses. No mastoid or middle ear effusion. The orbits are normal. IMPRESSION: 1. No acute intracranial abnormality. 2. 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 Electronically Signed   By: Ulyses Jarred M.D.   On: 10/09/2018 23:12   Ct Angio Neck W Or Wo Contrast  Result Date: 10/10/2018 CLINICAL DATA:  Acute vision loss. Acute right PCA infarct on limited MRI today. EXAM: CT ANGIOGRAPHY HEAD AND NECK TECHNIQUE: Multidetector CT imaging of the head and neck was performed using the standard protocol during bolus administration of intravenous contrast. Multiplanar CT image reconstructions and MIPs were obtained to evaluate the vascular anatomy. Carotid stenosis measurements (when applicable) are obtained utilizing NASCET criteria, using the distal internal carotid diameter as the denominator. CONTRAST:  35mL OMNIPAQUE IOHEXOL 350 MG/ML SOLN COMPARISON:  Neck CTA 04/16/2018.  Head MRA 04/15/2018. FINDINGS: The study suffers from intermittent, up to severe motion artifact due to patient agitation. CTA NECK FINDINGS Aortic arch: Standard 3 vessel aortic arch with moderate atherosclerotic plaque. No significant arch vessel origin stenosis. Partially visualized mild chronic aneurysmal dilatation of the ascending aorta, not progressive from the prior CTA as imaged today. Prominent atherosclerotic plaque in the left  subclavian artery just proximal to the left vertebral artery origin resulting in moderate to severe stenosis, similar to the prior CTA. Right carotid system: Proximal common carotid artery is obscured by streak artifact from venous contrast. New stents are present in the more distal common carotid artery and proximal ICA, grossly patent though with assessment for residual/recurrent stenosis precluded by severe motion artifact. Left carotid system: Patent with calcified plaque at the carotid bifurcation. Reassessment of known stenosis precluded by severe motion artifact. Vertebral arteries: The vertebral arteries are patent and codominant. At most mild stenosis of the vertebral artery origins. Skeleton: Severe left-sided cervical facet arthrosis. Severe disc degeneration at C6-7. Other neck: No gross acute abnormality or mass. Upper chest: Grossly clear lung apices.  Prior CABG. Review of the MIP images confirms the above findings CTA HEAD FINDINGS Anterior circulation: Motion artifact precludes assessment of the majority of the intracranial ICAs. The distal supraclinoid ICAs and ICA termini are widely patent bilaterally. ACAs and MCAs are patent without evidence of proximal branch occlusion. The left A1 segment is hypoplastic. No aneurysm is identified. Posterior circulation: The intracranial vertebral arteries are patent with artifact versus severe stenosis of the right vertebrobasilar junction. No significant stenosis was present in this location previously which may favor artifact. The basilar artery is grossly patent with severe motion artifact through its midportion. The right PCA is patent without significant proximal stenosis. There is a chronic severe left P1-P2 junction stenosis. No aneurysm is identified. Venous sinuses:  As permitted by contrast timing and motion artifact, patent. Anatomic variants: Hypoplastic left A1. Delayed phase: Not performed as the patient was agitated and trying to get off of the  CT table. Review of the MIP images confirms the above findings IMPRESSION: 1. Severely motion degraded examination. 2. Patent posterior circulation with chronic severe proximal left PCA stenosis. 3. Artifact versus new severe stenosis at the right vertebrobasilar junction. 4. Grossly patent carotid arteries with interval right carotid stenting. Nondiagnostic assessment for stenosis or dissection. 5. Similar appearance of moderate to severe left subclavian artery stenosis just proximal to the vertebral origin. 6.  Aortic Atherosclerosis (ICD10-I70.0). Electronically Signed   By: Logan Bores M.D.   On: 10/10/2018 16:25   Mr Brain Wo Contrast  Result Date: 10/10/2018 CLINICAL DATA:  85 y/o M; bilateral vision loss and right-sided weakness. EXAM: MRI HEAD WITHOUT CONTRAST TECHNIQUE: Axial DWI sequence was acquired. The patient was unable to continue and additional sequences were not acquired. COMPARISON:  10/09/2018 CT head.  04/15/2018 MRI head. FINDINGS: Severe motion degraded axial DWI sequence. Right occipital lobe reduced diffusion consistent with acute/early subacute infarction. Chronic left PCA distribution infarction. Suboptimal assessment for hemorrhage, small stroke, extra-axial collection, or subtle mass effect. IMPRESSION: Severe motion degraded DWI sequence. 1. Acute/early subacute infarct of right occipital lobe. Suboptimal assessment for hemorrhage due to motion artifact. 2. Chronic left PCA distribution infarct. These results will be called to the ordering clinician or representative by the Radiologist Assistant, and communication documented in the PACS or zVision Dashboard. Electronically Signed   By: Kristine Garbe M.D.   On: 10/10/2018 15:58    Medications:  I have reviewed the patient's current medications. Prior to Admission:  Medications Prior to Admission  Medication Sig Dispense Refill Last Dose  . apixaban (ELIQUIS) 5 MG TABS tablet Take 1 tablet (5 mg total) by mouth 2  (two) times daily. 30 tablet 1 10/09/2018 at am  . aspirin EC 81 MG EC tablet Take 1 tablet (81 mg total) by mouth daily. 180 tablet 2 10/09/2018 at Unknown time  . atorvastatin (LIPITOR) 40 MG tablet TAKE 1 TABLET BY MOUTH DAILY FOR CHOLESTEROL (Patient taking differently: Take 40 mg by mouth at bedtime. ) 30 tablet 1 Past Week at Unknown time  . clopidogrel (PLAVIX) 75 MG tablet TAKE 1 TABLET BY MOUTH DAILY 30 tablet 2 10/09/2018 at Unknown time  . divalproex (DEPAKOTE ER) 500 MG 24 hr tablet Take 1 tablet (500 mg total) by mouth daily. (Patient taking differently: Take 500 mg by mouth at bedtime. ) 30 tablet 1 Past Week at Unknown time  . fluticasone (FLONASE) 50 MCG/ACT nasal spray SPRAY 2 SPRAYS IN EACH NOSTRIL ONCE A DAY (Patient taking differently: Place 1 spray into both nostrils at bedtime. ) 16 g 2 Past Week at Unknown time  . latanoprost (XALATAN) 0.005 % ophthalmic solution Place 1 drop into both eyes at bedtime.   Past Week at Unknown time  . loratadine (CLARITIN) 10 MG tablet Take 10 mg by mouth daily.   10/09/2018 at Unknown time  . MELATONIN ER PO Take 1 tablet by mouth at bedtime.    Past Week at Unknown time  . metoprolol succinate (TOPROL-XL) 25 MG 24 hr tablet Take 0.5 tablets (12.5 mg total) by mouth daily. 30 tablet 1 10/09/2018 at Unknown time  . tizanidine (ZANAFLEX) 2 MG capsule 1-2 capsules q hs prn (Patient taking differently: Take 2 mg by mouth at bedtime. ) 60 capsule 0 Past Week at  Unknown time  . acetaminophen (TYLENOL) 500 MG tablet Take 1 tablet (500 mg total) by mouth every 6 (six) hours as needed. (Patient not taking: Reported on 08/12/2018) 30 tablet 0 Not Taking at Unknown time  . Multiple Vitamins-Minerals (PRESERVISION AREDS 2 PO) Take 1 capsule by mouth 2 (two) times daily.   Not Taking at Unknown time  . nitroGLYCERIN (NITROSTAT) 0.4 MG SL tablet Place 0.4 mg under the tongue every 5 (five) minutes as needed for chest pain.    Not Taking   Scheduled: . apixaban  5 mg  Oral BID  . aspirin EC  81 mg Oral Daily  . atorvastatin  40 mg Oral QHS  . clopidogrel  75 mg Oral Daily  . divalproex  500 mg Oral QHS  . fluticasone  1 spray Each Nare QHS  . latanoprost  1 drop Both Eyes QHS  . Melatonin  10 mg Oral QHS  . polyethylene glycol  17 g Oral Once    Assessment: 85 y.o. 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    Initial concerns for posterior circulation stroke or  possible PRES. MRI brain reviewed and showedacute/subacute infarct of right occipital lobe and left PCA distribution infarct.  Etiology likely due to poor posterior circulation.  CTA head and neck showed chronic severe proximal left PCA stenosis and new severe stenosis at the right vertebrobasilar junction.  Echocardiogram did not show cardiac source of emboli.  Plan: 1. HgbA1c, fasting lipid panel pending 2. Prophylactic therapy-Antiplatelet med: Aspirin - dose 81 mg/day and Anticoagulation: Eliquis- dose 5 mg BID 3. Recommend discontinuing Plavix 4. Statin with goal LDL <70 5. PT consult, OT consult, Speech consult 6.Telemetry monitoring 7. Frequent neuro checks  This patient was staffed with Dr. Irish Elders, Alease Frame who personally evaluated patient, reviewed documentation and agreed with assessment and plan of care as above.  Rufina Falco, DNP, FNP-BC Board certified Nurse Practitioner Neurology Department   LOS: 0 days   10/11/2018  1:34 PM

## 2018-10-11 NOTE — Progress Notes (Signed)
SLP Cancellation Note  Patient Details Name: Hansel Devan MRN: [id number] DOB: 1928-01-29   Cancelled treatment:       Reason Eval/Treat Not Completed: Fatigue/lethargy limiting ability to participate(pt sleeping post lunch meal. Dtr present)  Dtr denied any swallowing problems during lunch meal except the challenge of the vision deficits(self-feeding). Dtr/family assists during all meals. Discussed w/ Dtr the need for pt to Hold the Cup himself when drinking to reduce risk for aspiration; also discussed use of finger foods for pt to be more independent w/ self-feeding. Dtr agreed.  Checked w/ NSG who agreed pt was tolerating swallowing Pills and meals w/ no overt s/s of aspiration noted. Unsure of pt's Cognitive-Linguistic deficits - he had deficits post old CVA in 02/2018 noted during last admission w/ ST services. Dtr stated he was following instructions from her but needed cues to attend to her at times. Suspect pt could benefit from f/u once discharged to home or to Rehab setting to assess for any changes in Cognitive-Linguistic skills.  ST services will continue to f/u while admitted for any further education needs.     Orinda Kenner, MS, CCC-SLP Watson,Katherine 10/11/2018, 4:25 PM

## 2018-10-11 NOTE — Progress Notes (Signed)
[facility] NAME: Kyle Richardson    MR#:  [id number]  DATE OF BIRTH:  [birth year]  SUBJECTIVE:   Patient's mental status slightly improved since yesterday.  Still having difficulty seeing out of the right eye.  Patient's daughter is at bedside.  Having some hematuria this a.m.   REVIEW OF SYSTEMS:    Review of Systems  Unable to perform ROS: Mental acuity    Nutrition: Regular Tolerating Diet: Yes Tolerating PT: Eval noted.     DRUG ALLERGIES:   Allergies  Allergen Reactions  . Fentanyl Other (See Comments)    Reaction: confusion  . Flomax [Tamsulosin Hcl] Other (See Comments)    Unknown  . Lamisil [Terbinafine] Other (See Comments)    Unknown  . Levofloxacin Other (See Comments)    Diplopia. NEVER put patient on levoquin!  . Lorazepam Other (See Comments)    Reaction: confusion/ hallucinations   . Metformin And Related Diarrhea  . Sertraline Other (See Comments)    Unknown  . Sulfa Antibiotics Other (See Comments)    Unknown  . Versed [Midazolam] Other (See Comments)    Hallucinations and confusion  . Clarithromycin Diarrhea  . Codeine Other (See Comments)    Unknown    VITALS:  Blood pressure 104/76, pulse 78, temperature 97.8 F (36.6 C), temperature source Oral, resp. rate 18, height 5\' 6"  (1.676 m), weight 81.6 kg, SpO2 95 %.  PHYSICAL EXAMINATION:   Physical Exam  GENERAL:  85 y.o.-year-old patient lying in bed lethargic/encephalopathic and hard of hearing.  EYES: Pupils equal, round, reactive to light. No scleral icterus. Extraocular muscles intact.  HEENT: Head atraumatic, normocephalic. Oropharynx and nasopharynx clear.  NECK:  Supple, no jugular venous distention. No thyroid enlargement, no tenderness.  LUNGS: Poor Resp. effort, no wheezing, rales, rhonchi. No use of accessory muscles of respiration.  CARDIOVASCULAR: S1, S2 normal. No murmurs, rubs, or gallops.  ABDOMEN: Soft, nontender,  nondistended. Bowel sounds present. No organomegaly or mass.  EXTREMITIES: No cyanosis, clubbing or edema b/l.    NEUROLOGIC: Cranial nerves II through XII are intact. No focal Motor or sensory deficits b/l.  Globally weak PSYCHIATRIC: The patient is alert and oriented x 1.  SKIN: No obvious rash, lesion, or ulcer.    LABORATORY PANEL:   CBC Recent Labs  Lab 10/09/18 2229  WBC 6.6  HGB 13.4  HCT 40.3  PLT 189   ------------------------------------------------------------------------------------------------------------------  Chemistries  Recent Labs  Lab 10/09/18 2229 10/10/18 0318  NA 138  --   K 3.6  --   CL 105  --   CO2 26  --   GLUCOSE 187*  --   BUN 16  --   CREATININE 0.93  --   CALCIUM 8.6*  --   MG  --  2.0  AST  --  21  ALT  --  20  ALKPHOS  --  58  BILITOT  --  0.5   ------------------------------------------------------------------------------------------------------------------  Cardiac Enzymes Recent Labs  Lab 10/09/18 2229  TROPONINI <0.03   ------------------------------------------------------------------------------------------------------------------  RADIOLOGY:  Ct Angio Head W Or Wo Contrast  Result Date: 10/10/2018 CLINICAL DATA:  Acute vision loss. Acute right PCA infarct on limited MRI today. EXAM: CT ANGIOGRAPHY HEAD AND NECK TECHNIQUE: Multidetector CT imaging of the head and neck was performed using the standard protocol during bolus administration of intravenous contrast. Multiplanar CT image reconstructions and MIPs were obtained to evaluate the vascular anatomy. Carotid stenosis  measurements (when applicable) are obtained utilizing NASCET criteria, using the distal internal carotid diameter as the denominator. CONTRAST:  13mL OMNIPAQUE IOHEXOL 350 MG/ML SOLN COMPARISON:  Neck CTA 04/16/2018.  Head MRA 04/15/2018. FINDINGS: The study suffers from intermittent, up to severe motion artifact due to patient agitation. CTA NECK FINDINGS  Aortic arch: Standard 3 vessel aortic arch with moderate atherosclerotic plaque. No significant arch vessel origin stenosis. Partially visualized mild chronic aneurysmal dilatation of the ascending aorta, not progressive from the prior CTA as imaged today. Prominent atherosclerotic plaque in the left subclavian artery just proximal to the left vertebral artery origin resulting in moderate to severe stenosis, similar to the prior CTA. Right carotid system: Proximal common carotid artery is obscured by streak artifact from venous contrast. New stents are present in the more distal common carotid artery and proximal ICA, grossly patent though with assessment for residual/recurrent stenosis precluded by severe motion artifact. Left carotid system: Patent with calcified plaque at the carotid bifurcation. Reassessment of known stenosis precluded by severe motion artifact. Vertebral arteries: The vertebral arteries are patent and codominant. At most mild stenosis of the vertebral artery origins. Skeleton: Severe left-sided cervical facet arthrosis. Severe disc degeneration at C6-7. Other neck: No gross acute abnormality or mass. Upper chest: Grossly clear lung apices.  Prior CABG. Review of the MIP images confirms the above findings CTA HEAD FINDINGS Anterior circulation: Motion artifact precludes assessment of the majority of the intracranial ICAs. The distal supraclinoid ICAs and ICA termini are widely patent bilaterally. ACAs and MCAs are patent without evidence of proximal branch occlusion. The left A1 segment is hypoplastic. No aneurysm is identified. Posterior circulation: The intracranial vertebral arteries are patent with artifact versus severe stenosis of the right vertebrobasilar junction. No significant stenosis was present in this location previously which may favor artifact. The basilar artery is grossly patent with severe motion artifact through its midportion. The right PCA is patent without significant  proximal stenosis. There is a chronic severe left P1-P2 junction stenosis. No aneurysm is identified. Venous sinuses: As permitted by contrast timing and motion artifact, patent. Anatomic variants: Hypoplastic left A1. Delayed phase: Not performed as the patient was agitated and trying to get off of the CT table. Review of the MIP images confirms the above findings IMPRESSION: 1. Severely motion degraded examination. 2. Patent posterior circulation with chronic severe proximal left PCA stenosis. 3. Artifact versus new severe stenosis at the right vertebrobasilar junction. 4. Grossly patent carotid arteries with interval right carotid stenting. Nondiagnostic assessment for stenosis or dissection. 5. Similar appearance of moderate to severe left subclavian artery stenosis just proximal to the vertebral origin. 6.  Aortic Atherosclerosis (ICD10-I70.0). Electronically Signed   By: Logan Bores M.D.   On: 10/10/2018 16:25   Ct Head Wo Contrast  Result Date: 10/09/2018 CLINICAL DATA:  Vision loss EXAM: CT HEAD WITHOUT CONTRAST TECHNIQUE: Contiguous axial images were obtained from the base of the skull through the vertex without intravenous contrast. COMPARISON:  05/23/2018 FINDINGS: Brain: There is no mass, hemorrhage or extra-axial collection. Generalized atrophy with ex vacuo dilatation of the occipital horn of left lateral ventricle. Old left PCA territory infarct. There is periventricular hypoattenuation compatible with chronic microvascular disease. Vascular: Atherosclerotic calcification of the internal carotid arteries at the skull base. No abnormal hyperdensity of the major intracranial arteries or dural venous sinuses. Skull: The visualized skull base, calvarium and extracranial soft tissues are normal. Sinuses/Orbits: No fluid levels or advanced mucosal thickening of the visualized paranasal sinuses. No  mastoid or middle ear effusion. The orbits are normal. IMPRESSION: 1. No acute intracranial abnormality. 2.  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 Electronically Signed   By: Ulyses Jarred M.D.   On: 10/09/2018 23:12   Ct Angio Neck W Or Wo Contrast  Result Date: 10/10/2018 CLINICAL DATA:  Acute vision loss. Acute right PCA infarct on limited MRI today. EXAM: CT ANGIOGRAPHY HEAD AND NECK TECHNIQUE: Multidetector CT imaging of the head and neck was performed using the standard protocol during bolus administration of intravenous contrast. Multiplanar CT image reconstructions and MIPs were obtained to evaluate the vascular anatomy. Carotid stenosis measurements (when applicable) are obtained utilizing NASCET criteria, using the distal internal carotid diameter as the denominator. CONTRAST:  35mL OMNIPAQUE IOHEXOL 350 MG/ML SOLN COMPARISON:  Neck CTA 04/16/2018.  Head MRA 04/15/2018. FINDINGS: The study suffers from intermittent, up to severe motion artifact due to patient agitation. CTA NECK FINDINGS Aortic arch: Standard 3 vessel aortic arch with moderate atherosclerotic plaque. No significant arch vessel origin stenosis. Partially visualized mild chronic aneurysmal dilatation of the ascending aorta, not progressive from the prior CTA as imaged today. Prominent atherosclerotic plaque in the left subclavian artery just proximal to the left vertebral artery origin resulting in moderate to severe stenosis, similar to the prior CTA. Right carotid system: Proximal common carotid artery is obscured by streak artifact from venous contrast. New stents are present in the more distal common carotid artery and proximal ICA, grossly patent though with assessment for residual/recurrent stenosis precluded by severe motion artifact. Left carotid system: Patent with calcified plaque at the carotid bifurcation. Reassessment of known stenosis precluded by severe motion artifact. Vertebral arteries: The vertebral arteries are patent and codominant. At most mild stenosis of the vertebral artery origins.  Skeleton: Severe left-sided cervical facet arthrosis. Severe disc degeneration at C6-7. Other neck: No gross acute abnormality or mass. Upper chest: Grossly clear lung apices.  Prior CABG. Review of the MIP images confirms the above findings CTA HEAD FINDINGS Anterior circulation: Motion artifact precludes assessment of the majority of the intracranial ICAs. The distal supraclinoid ICAs and ICA termini are widely patent bilaterally. ACAs and MCAs are patent without evidence of proximal branch occlusion. The left A1 segment is hypoplastic. No aneurysm is identified. Posterior circulation: The intracranial vertebral arteries are patent with artifact versus severe stenosis of the right vertebrobasilar junction. No significant stenosis was present in this location previously which may favor artifact. The basilar artery is grossly patent with severe motion artifact through its midportion. The right PCA is patent without significant proximal stenosis. There is a chronic severe left P1-P2 junction stenosis. No aneurysm is identified. Venous sinuses: As permitted by contrast timing and motion artifact, patent. Anatomic variants: Hypoplastic left A1. Delayed phase: Not performed as the patient was agitated and trying to get off of the CT table. Review of the MIP images confirms the above findings IMPRESSION: 1. Severely motion degraded examination. 2. Patent posterior circulation with chronic severe proximal left PCA stenosis. 3. Artifact versus new severe stenosis at the right vertebrobasilar junction. 4. Grossly patent carotid arteries with interval right carotid stenting. Nondiagnostic assessment for stenosis or dissection. 5. Similar appearance of moderate to severe left subclavian artery stenosis just proximal to the vertebral origin. 6.  Aortic Atherosclerosis (ICD10-I70.0). Electronically Signed   By: Logan Bores M.D.   On: 10/10/2018 16:25   Mr Brain Wo Contrast  Result Date: 10/10/2018 CLINICAL DATA:  85 y/o M;  bilateral vision loss and  right-sided weakness. EXAM: MRI HEAD WITHOUT CONTRAST TECHNIQUE: Axial DWI sequence was acquired. The patient was unable to continue and additional sequences were not acquired. COMPARISON:  10/09/2018 CT head.  04/15/2018 MRI head. FINDINGS: Severe motion degraded axial DWI sequence. Right occipital lobe reduced diffusion consistent with acute/early subacute infarction. Chronic left PCA distribution infarction. Suboptimal assessment for hemorrhage, small stroke, extra-axial collection, or subtle mass effect. IMPRESSION: Severe motion degraded DWI sequence. 1. Acute/early subacute infarct of right occipital lobe. Suboptimal assessment for hemorrhage due to motion artifact. 2. Chronic left PCA distribution infarct. These results will be called to the ordering clinician or representative by the Radiologist Assistant, and communication documented in the PACS or zVision Dashboard. Electronically Signed   By: Kristine Garbe M.D.   On: 10/10/2018 15:58     ASSESSMENT AND PLAN:   85 year old male with past medical history of previous CVA, obstructive sleep apnea, osteoarthritis, hypertension, hyperlipidemia, glaucoma, diabetes, coronary artery disease who presented to the hospital due to sudden onset of vision loss along with right-sided weakness.  1.  Sudden onset visual loss/right-sided weakness- suspected to be secondary to stroke/posterior reversible encephalopathy as per neurology. Patient's MRI of the brain was positive yesterday acute/subacute right occipital lobe CVA. -As per neurology this is likely small vessel disease.  Continue dual antiplatelet therapy with aspirin, Eliquis.  Discontinue Plavix. -CTA of the head& neck showing severe PCA stenosis on the left and also new stenosis of the right vertebrobasliar artery.  - Seen by PT and they recommend ?? Cont. Inpatient rehab and await input from social work/case management.  - seen by speech and started on regular  diet and will cont. To monitor.   2.  Hematuria-patient was having some dark urine this morning.  Patient has a history of ureteral stents and has ongoing hematuria.  Will check hemoglobin this afternoon.  Continue dual anticoagulation given the acute/subacute CVA for now.  3.  Altered mental status/encephalopathy-secondary to the stroke and underlying vascular dementia. -Continue supportive care as mentioned above.  4.  History of atrial fibrillation-rate controlled.  Continue Toprol, Eliquis.  5.  Glaucoma- continue latanoprost eyedrops.  6.  HyperLipidemia-continue atorvastatin.  Physical therapy recommending continuation inpatient rehab but patient given his vascular dementia is likely not a good candidate for that.  May need to consider palliative care consult for goals of care given his poor prognosis.   All the records are reviewed and case discussed with Care Management/Social Worker. Management plans discussed with the patient, family and they are in agreement.  CODE STATUS: Full code  DVT Prophylaxis: Eliquis  TOTAL TIME TAKING CARE OF THIS PATIENT: 30 minutes.   POSSIBLE D/C IN 1-2 DAYS, DEPENDING ON CLINICAL CONDITION.   Henreitta Leber M.D on 10/11/2018 at 3:36 PM  Between 7am to 6pm - Pager - [phone]  After 6pm go to [email or web address] - Proofreader  [facility]  Office  [phone]  CC: Primary care physician; Einar Pheasant, MD

## 2018-10-11 NOTE — Progress Notes (Signed)
PT Cancellation Note  Patient Details Name: Kyle Richardson MRN: [id number] DOB: 1928-01-26   Cancelled Treatment:    Reason Eval/Treat Not Completed: Other (comment).  RN and NT at pt's bedside changing sheets and pt's gown.  Will continue to follow acutely.    Collie Siad PT, DPT 10/11/2018, 1:48 PM

## 2018-10-11 NOTE — Progress Notes (Signed)
Palliative:  I have received palliative consult. I will plan to see patient Monday 10/14/18. If patient is discharged prior to Monday please recommend outpatient palliative to follow in d/c summary for palliative to see in next venue. Thank you for this consult.   No charge  Vinie Sill, NP Palliative Medicine Team Pager # [phone] (M-F 8a-5p) Team Phone # [phone] (Nights/Weekends)

## 2018-10-12 NOTE — Progress Notes (Addendum)
Physical Therapy Treatment Patient Details Name: Kyle Richardson MRN: [id number] DOB: [birth year] Today's Date: 10/12/2018    History of Present Illness Pt is a 85 y/o M who presented with acute sudden onset Bil loss of vision.  CT head performed in the ED and was negative for acute intracranial abnormality.  MRI brain showed acute/subacute infarct of R occipital lobe and L PCA distribuation infarct.  CTA head and neck showed chronic severe proximal left PCA stenosis and new severe stenosis at the right vertebrobasilar junction.  Echocardiogram did not show cardiac source of emboli.  Pt's PMH includes CABG, CAD, diabetes mellitus, glaucoma, hyperlipidemia, hypertension, MI, migraine headaches, left PCA infarct with residual weakness of right arm and right leg, and right homonymous hemianopsia, right ICA stenosis status post stenting and paroxysmal atrial fibrillation on Eliquis.    PT Comments    Pt bed,  Asleep but agrees to session.  Daughter in and reports discharge home today.  Pt awoke with encouragement.  Session focused on transfers and training with daughter in anticipation of discharge to home.  To edge of bed with mod a x 1.  Once sitting he has difficulty maintaining balance for more than a few moments.  He stood with walker and mod a x 2 and was able to transfer x 3 bed to/from recliner.  Stand pivot without device was also tried but overall he did better with walker.  Discussed safety with transfer to wheelchair and car.  Daughter has been primary caregiver for both parents and is confident in her abilities.  She also has a CNA at home to assist.    Pt continues with visual deficits despite saying he is "OK".  He has trouble making eye contact and often looks off and away when talking.  Ataxic movement patterns R side with activity and resists flexion R knee with verbal and physical assist.  While pt would still benefit from CIR as he is far from his baseline.  Pt will benefit from [facility]  services including nursing, PT, OT and SLP.  They have all equipment necessary but may want a hospital bed for home if he does not improve significantly.   Follow Up Recommendations  CIR;Home health PT;Supervision/Assistance - 24 hour;Supervision for mobility/OOB     Equipment Recommendations       Recommendations for Other Services       Precautions / Restrictions Precautions Precautions: Fall Restrictions Weight Bearing Restrictions: No    Mobility  Bed Mobility Overal bed mobility: Needs Assistance Bed Mobility: Supine to Sit;Sit to Supine     Supine to sit: Mod assist Sit to supine: Max assist      Transfers Overall transfer level: Needs assistance Equipment used: Rolling walker (2 wheeled);None Transfers: Sit to/from [employer] to Stand: Min assist;+2 physical assistance Stand pivot transfers: Mod assist;+2 physical assistance       General transfer comment: right lean, tactile cues for hand placement, generally unsteady.  Does better with walker vs without  Ambulation/Gait Ambulation/Gait assistance: Min assist;Mod assist;+2 physical assistance Gait Distance (Feet): 3 Feet Assistive device: Rolling walker (2 wheeled) Gait Pattern/deviations: Step-to pattern;Staggering right;Narrow base of support     General Gait Details: right lean with walker, unsteady steps, +2 for safety   Stairs             Wheelchair Mobility    Modified Rankin (Stroke Patients Only)       Balance Overall balance assessment: Needs assistance Sitting-balance support: Bilateral upper extremity supported;Feet  supported;Feet unsupported Sitting balance-Leahy Scale: Poor Sitting balance - Comments: unable to sit for more than a few monents without verbal and tactile cues and assist to correct.  Unsafe to sit unattended. Postural control: Posterior lean;Right lateral lean Standing balance support: Bilateral upper extremity supported Standing  balance-Leahy Scale: Poor                              Cognition Arousal/Alertness: Lethargic Behavior During Therapy: WFL for tasks assessed/performed Overall Cognitive Status: Impaired/Different from baseline                                        Exercises      General Comments General comments (skin integrity, edema, etc.): daughter in for session and for training.      Pertinent Vitals/Pain Pain Assessment: Faces Faces Pain Scale: Hurts a little bit Pain Location: back/kidney pain - from stent per daughter Pain Descriptors / Indicators: Aching Pain Intervention(s): Limited activity within patient's tolerance;Monitored during session;Repositioned    Home Living                      Prior Function            PT Goals (current goals can now be found in the care plan section) Progress towards PT goals: Progressing toward goals    Frequency    7X/week      PT Plan Current plan remains appropriate    Co-evaluation              AM-PAC PT "6 Clicks" Mobility   Outcome Measure  Help needed turning from your back to your side while in a flat bed without using bedrails?: A Lot Help needed moving from lying on your back to sitting on the side of a flat bed without using bedrails?: A Lot Help needed moving to and from a bed to a chair (including a wheelchair)?: Total Help needed standing up from a chair using your arms (e.g., wheelchair or bedside chair)?: Total Help needed to walk in hospital room?: Total Help needed climbing 3-5 steps with a railing? : Total 6 Click Score: 8    End of Session Equipment Utilized During Treatment: Gait belt   Patient left: in bed;with call bell/phone within reach;with family/visitor present;with bed alarm set         Time: 1111-1141 PT Time Calculation (min) (ACUTE ONLY): 30 min  Charges:  $Therapeutic Activity: 23-37 mins                     Chesley Noon, PTA 10/12/18, 11:54  AM

## 2018-10-12 NOTE — Progress Notes (Signed)
MD order received in [facility] to discharge pt home with home health today; Care Management previously established home health RN, PT and NT with [facility]; verbally reviewed AVS with pt and pt's daughter, no new Rxs; no questions voiced at this time; pt's discharge pending the pt getting dressed

## 2018-10-12 NOTE — Progress Notes (Signed)
Hyperactive delirium   Past Medical History:  Diagnosis Date  . Abdominal fibromatosis   . 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  s/p PTCA of the mid RCA 1/96, also  s/p  CABG x 4 7/96  . Complication of anesthesia    confusion and hallucinations for several days after receiving Versed  . Degenerative disc disease   . Diabetes mellitus ([place])   . Glaucoma   . Hypercholesterolemia   . Hypertension   . Migraine headache    history of  . Myocardial infarction ([place]) 1996  . Osteoarthritis   . Sleep apnea   . Stroke [facility]) 02/22/2017   affected left side     Past Surgical History:  Procedure Laterality Date  . APPENDECTOMY    . CAROTID ANGIOGRAPHY Bilateral 04/19/2018   Procedure: CAROTID ANGIOGRAPHY;  Surgeon: Algernon Huxley, MD;  Location: [place] [facility];  Service: Cardiovascular;  Laterality: Bilateral;  . CAROTID PTA/STENT INTERVENTION Right 05/22/2018   Procedure: CAROTID PTA/STENT INTERVENTION;  Surgeon: Katha Cabal, MD;  Location: [place] [facility];  Service: Cardiovascular;  Laterality: Right;  . CATARACT EXTRACTION W/ INTRAOCULAR LENS  IMPLANT, BILATERAL    . CHOLECYSTECTOMY     Removed  . CORONARY ARTERY BYPASS GRAFT  7/96   x4  . HEMORRHOID SURGERY    . HERNIA REPAIR    . TONSILLECTOMY    . UPP and tonsillectomy  1/86    Family History  Problem Relation Age of Onset  . Heart disease Mother        died age 67  . Heart disease Father        and other vascular disease  . Diabetes Other        four siblings  . Heart disease Brother        s/p CABG  . Colon cancer Neg Hx   . Prostate cancer Neg Hx    Social History:  reports that he has never smoked. He has never used smokeless tobacco. He reports that he does not drink alcohol or use drugs.  Allergies:  Allergies  Allergen Reactions  . Fentanyl Other (See Comments)    Reaction: confusion  . Flomax [Tamsulosin Hcl] Other (See Comments)    Unknown  . Lamisil  [Terbinafine] Other (See Comments)    Unknown  . Levofloxacin Other (See Comments)    Diplopia. NEVER put patient on levoquin!  . Lorazepam Other (See Comments)    Reaction: confusion/ hallucinations   . Metformin And Related Diarrhea  . Sertraline Other (See Comments)    Unknown  . Sulfa Antibiotics Other (See Comments)    Unknown  . Versed [Midazolam] Other (See Comments)    Hallucinations and confusion  . Clarithromycin Diarrhea  . Codeine Other (See Comments)    Unknown    Medications:  I have reviewed the patient's current medications. Prior to Admission:  Medications Prior to Admission  Medication Sig Dispense Refill Last Dose  . apixaban (ELIQUIS) 5 MG TABS tablet Take 1 tablet (5 mg total) by mouth 2 (two) times daily. 30 tablet 1 10/09/2018 at am  . aspirin EC 81 MG EC tablet Take 1 tablet (81 mg total) by mouth daily. 180 tablet 2 10/09/2018 at Unknown time  . atorvastatin (LIPITOR) 40 MG tablet TAKE 1 TABLET BY MOUTH DAILY FOR CHOLESTEROL (Patient taking differently: Take 40 mg by mouth at bedtime. ) 30 tablet 1 Past Week at Unknown time  .  clopidogrel (PLAVIX) 75 MG tablet TAKE 1 TABLET BY MOUTH DAILY 30 tablet 2 10/09/2018 at Unknown time  . divalproex (DEPAKOTE ER) 500 MG 24 hr tablet Take 1 tablet (500 mg total) by mouth daily. (Patient taking differently: Take 500 mg by mouth at bedtime. ) 30 tablet 1 Past Week at Unknown time  . fluticasone (FLONASE) 50 MCG/ACT nasal spray SPRAY 2 SPRAYS IN EACH NOSTRIL ONCE A DAY (Patient taking differently: Place 1 spray into both nostrils at bedtime. ) 16 g 2 Past Week at Unknown time  . latanoprost (XALATAN) 0.005 % ophthalmic solution Place 1 drop into both eyes at bedtime.   Past Week at Unknown time  . loratadine (CLARITIN) 10 MG tablet Take 10 mg by mouth daily.   10/09/2018 at Unknown time  . MELATONIN ER PO Take 1 tablet by mouth at bedtime.    Past Week at Unknown time  . metoprolol succinate (TOPROL-XL) 25 MG 24 hr tablet Take 0.5  tablets (12.5 mg total) by mouth daily. 30 tablet 1 10/09/2018 at Unknown time  . tizanidine (ZANAFLEX) 2 MG capsule 1-2 capsules q hs prn (Patient taking differently: Take 2 mg by mouth at bedtime. ) 60 capsule 0 Past Week at Unknown time  . acetaminophen (TYLENOL) 500 MG tablet Take 1 tablet (500 mg total) by mouth every 6 (six) hours as needed. (Patient not taking: Reported on 08/12/2018) 30 tablet 0 Not Taking at Unknown time  . Multiple Vitamins-Minerals (PRESERVISION AREDS 2 PO) Take 1 capsule by mouth 2 (two) times daily.   Not Taking at Unknown time  . nitroGLYCERIN (NITROSTAT) 0.4 MG SL tablet Place 0.4 mg under the tongue every 5 (five) minutes as needed for chest pain.    Not Taking   Scheduled: . apixaban  5 mg Oral BID  . aspirin EC  81 mg Oral Daily  . atorvastatin  40 mg Oral QHS  . divalproex  500 mg Oral QHS  . fluticasone  1 spray Each Nare QHS  . latanoprost  1 drop Both Eyes QHS  . Melatonin  10 mg Oral QHS  . polyethylene glycol  17 g Oral Once    ROS: History obtained from the patient   General ROS: negative for - chills, fatigue, fever, night sweats, weight gain or weight loss Psychological ROS: negative for - behavioral disorder, hallucinations, memory difficulties, mood swings or suicidal ideation Ophthalmic ROS: negative for - blurry vision, double vision, eye pain or loss of vision ENT ROS: negative for - epistaxis, nasal discharge, oral lesions, sore throat, tinnitus or vertigo Allergy and Immunology ROS: negative for - hives or itchy/watery eyes Hematological and Lymphatic ROS: negative for - bleeding problems, bruising or swollen lymph nodes Endocrine ROS: negative for - galactorrhea, hair pattern changes, polydipsia/polyuria or temperature intolerance Respiratory ROS: negative for - cough, hemoptysis, shortness of breath or wheezing Cardiovascular ROS: negative for - chest pain, dyspnea on exertion, edema or irregular heartbeat Gastrointestinal ROS: negative  for - abdominal pain, diarrhea, hematemesis, nausea/vomiting or stool incontinence Genito-Urinary ROS: negative for - dysuria, hematuria, incontinence or urinary frequency/urgency Musculoskeletal ROS: negative for - joint swelling or muscular weakness Neurological ROS: as noted in HPI Dermatological ROS: negative for rash and skin lesion changes  Physical Examination: Blood pressure (!) 147/61, pulse 62, temperature 97.6 F (36.4 C), resp. rate 18, height 5\' 6"  (1.676 m), weight 81.6 kg, SpO2 96 %.   HEENT-  Normocephalic, no lesions, without obvious abnormality.  Normal external eye and conjunctiva.  Normal TM's bilaterally.  Normal auditory canals and external ears. Normal external nose, mucus membranes and septum.  Normal pharynx. Cardiovascular- S1, S2 normal, pulses palpable throughout   Lungs- chest clear, no wheezing, rales, normal symmetric air entry Abdomen- soft, non-tender; bowel sounds normal; no masses,  no organomegaly Extremities- no edema Lymph-no adenopathy palpable Musculoskeletal-no joint tenderness, deformity or swelling Skin-warm and dry, no hyperpigmentation, vitiligo, or suspicious lesions  Neurological Exam   Mental Status: Alert, oriented, thought content appropriate.  Speech fluent without evidence of aphasia.  Able to follow 3 step commands without difficulty. Attention span and concentration seemed appropriate  Cranial Nerves: II: Discs flat bilaterally; bilateral homonymous hemianopia, pupils equal, round, reactive to light and accommodation III,IV, VI: ptosis not present, extra-ocular motions intact bilaterally V,VII: smile symmetric, facial light touch sensation intact VIII: hearing normal bilaterally IX,X: gag reflex present XI: bilateral shoulder shrug XII: midline tongue extension Motor: Right :  Upper extremity   4+/5 Without pronator drift      Left: Upper extremity   5/5 without pronator drift Right:   Lower extremity   4+/5                                           Left: Lower extremity   5/5 Tone and bulk:normal tone throughout; no atrophy noted Sensory: Pinprick and light touch intact bilaterally Deep Tendon Reflexes: 2+ and symmetric throughout Plantars: Right: mute                              Left: mute Cerebellar: Finger-to-nose testing intact bilaterally. Heel to shin testing normal bilaterally Gait: not tested due to safety concerns  Data Reviewed  Laboratory Studies:  Basic Metabolic Panel: Recent Labs  Lab 10/09/18 2229 10/10/18 0318  NA 138  --   K 3.6  --   CL 105  --   CO2 26  --   GLUCOSE 187*  --   BUN 16  --   CREATININE 0.93  --   CALCIUM 8.6*  --   MG  --  2.0  PHOS  --  3.7    Liver Function Tests: Recent Labs  Lab 10/10/18 0318  AST 21  ALT 20  ALKPHOS 58  BILITOT 0.5  PROT 6.2*  ALBUMIN 3.6   No results for input(s): LIPASE, AMYLASE in the last 168 hours. No results for input(s): AMMONIA in the last 168 hours.  CBC: Recent Labs  Lab 10/09/18 2229 10/11/18 1658  WBC 6.6  --   NEUTROABS 3.1  --   HGB 13.4 14.4  HCT 40.3  --   MCV 96.9  --   PLT 189  --     Cardiac Enzymes: Recent Labs  Lab 10/09/18 2229  TROPONINI <0.03    BNP: Invalid input(s): POCBNP  CBG: No results for input(s): GLUCAP in the last 168 hours.  Microbiology: Results for orders placed or performed during the hospital encounter of 10/09/18  Urine Culture     Status: Abnormal   Collection Time: 10/09/18 11:50 PM  Result Value Ref Range Status   Specimen Description   Final    URINE, RANDOM Performed at [facility], [address] [place], [place] [address]    Special Requests   Final    NONE Performed at [facility], [address] [place], [place] [address]    Culture (A)  Final    <10,000 COLONIES/mL INSIGNIFICANT GROWTH Performed at [facility] [address], [place], [place] [address]    Report Status 10/11/2018 FINAL  Final   Coagulation  Studies: Recent Labs    10/09/18 01-03-2228  LABPROT 13.6  INR 1.05    Urinalysis:  Recent Labs  Lab 10/09/18 2350  COLORURINE RED*  LABSPEC 1.014  PHURINE 7.0  GLUCOSEU 50*  HGBUR LARGE*  BILIRUBINUR NEGATIVE  KETONESUR NEGATIVE  PROTEINUR 100*  NITRITE NEGATIVE  LEUKOCYTESUR TRACE*   Lipid Panel:    Component Value Date/Time   CHOL 159 04/16/2018 0558   CHOL 129 08/13/2013 1026   TRIG 288 (H) 04/16/2018 0558   TRIG 166 08/13/2013 1026   HDL 35 (L) 04/16/2018 0558   HDL 47 08/13/2013 1026   CHOLHDL 4.5 04/16/2018 0558   VLDL 58 (H) 04/16/2018 0558   VLDL 33 08/13/2013 1026   LDLCALC 66 04/16/2018 0558   LDLCALC 49 08/13/2013 1026    HgbA1C:  Lab Results  Component Value Date   HGBA1C 6.8 (H) 04/16/2018   Urine Drug Screen:  No results found for: LABOPIA, COCAINSCRNUR, LABBENZ, AMPHETMU, THCU, LABBARB  Alcohol Level: No results for input(s): ETH in the last 168 hours.  Other results: EKG: normal EKG, normal sinus rhythm, unchanged from previous tracings. Vent. rate 63 BPM PR interval * ms QRS duration 101 ms QT/QTc 461/472 ms P-R-T axes -21 81 117 Imaging: Ct Angio Head W Or Wo Contrast  Result Date: 10/10/2018 CLINICAL DATA:  Acute vision loss. Acute right PCA infarct on limited MRI today. EXAM: CT ANGIOGRAPHY HEAD AND NECK TECHNIQUE: Multidetector CT imaging of the head and neck was performed using the standard protocol during bolus administration of intravenous contrast. Multiplanar CT image reconstructions and MIPs were obtained to evaluate the vascular anatomy. Carotid stenosis measurements (when applicable) are obtained utilizing NASCET criteria, using the distal internal carotid diameter as the denominator. CONTRAST:  58mL OMNIPAQUE IOHEXOL 350 MG/ML SOLN COMPARISON:  Neck CTA 04/16/2018.  Head MRA 04/15/2018. FINDINGS: The study suffers from intermittent, up to severe motion artifact due to patient agitation. CTA NECK FINDINGS Aortic arch: Standard 3 vessel  aortic arch with moderate atherosclerotic plaque. No significant arch vessel origin stenosis. Partially visualized mild chronic aneurysmal dilatation of the ascending aorta, not progressive from the prior CTA as imaged today. Prominent atherosclerotic plaque in the left subclavian artery just proximal to the left vertebral artery origin resulting in moderate to severe stenosis, similar to the prior CTA. Right carotid system: Proximal common carotid artery is obscured by streak artifact from venous contrast. New stents are present in the more distal common carotid artery and proximal ICA, grossly patent though with assessment for residual/recurrent stenosis precluded by severe motion artifact. Left carotid system: Patent with calcified plaque at the carotid bifurcation. Reassessment of known stenosis precluded by severe motion artifact. Vertebral arteries: The vertebral arteries are patent and codominant. At most mild stenosis of the vertebral artery origins. Skeleton: Severe left-sided cervical facet arthrosis. Severe disc degeneration at C6-7. Other neck: No gross acute abnormality or mass. Upper chest: Grossly clear lung apices.  Prior CABG. Review of the MIP images confirms the above findings CTA HEAD FINDINGS Anterior circulation: Motion artifact precludes assessment of the majority of the intracranial ICAs. The distal supraclinoid ICAs and ICA termini are widely patent bilaterally. ACAs and MCAs are patent without evidence of proximal branch occlusion. The left A1 segment  is hypoplastic. No aneurysm is identified. Posterior circulation: The intracranial vertebral arteries are patent with artifact versus severe stenosis of the right vertebrobasilar junction. No significant stenosis was present in this location previously which may favor artifact. The basilar artery is grossly patent with severe motion artifact through its midportion. The right PCA is patent without significant proximal stenosis. There is a  chronic severe left P1-P2 junction stenosis. No aneurysm is identified. Venous sinuses: As permitted by contrast timing and motion artifact, patent. Anatomic variants: Hypoplastic left A1. Delayed phase: Not performed as the patient was agitated and trying to get off of the CT table. Review of the MIP images confirms the above findings IMPRESSION: 1. Severely motion degraded examination. 2. Patent posterior circulation with chronic severe proximal left PCA stenosis. 3. Artifact versus new severe stenosis at the right vertebrobasilar junction. 4. Grossly patent carotid arteries with interval right carotid stenting. Nondiagnostic assessment for stenosis or dissection. 5. Similar appearance of moderate to severe left subclavian artery stenosis just proximal to the vertebral origin. 6.  Aortic Atherosclerosis (ICD10-I70.0). Electronically Signed   By: Logan Bores M.D.   On: 10/10/2018 16:25   Ct Angio Neck W Or Wo Contrast  Result Date: 10/10/2018 CLINICAL DATA:  Acute vision loss. Acute right PCA infarct on limited MRI today. EXAM: CT ANGIOGRAPHY HEAD AND NECK TECHNIQUE: Multidetector CT imaging of the head and neck was performed using the standard protocol during bolus administration of intravenous contrast. Multiplanar CT image reconstructions and MIPs were obtained to evaluate the vascular anatomy. Carotid stenosis measurements (when applicable) are obtained utilizing NASCET criteria, using the distal internal carotid diameter as the denominator. CONTRAST:  67mL OMNIPAQUE IOHEXOL 350 MG/ML SOLN COMPARISON:  Neck CTA 04/16/2018.  Head MRA 04/15/2018. FINDINGS: The study suffers from intermittent, up to severe motion artifact due to patient agitation. CTA NECK FINDINGS Aortic arch: Standard 3 vessel aortic arch with moderate atherosclerotic plaque. No significant arch vessel origin stenosis. Partially visualized mild chronic aneurysmal dilatation of the ascending aorta, not progressive from the prior CTA as imaged  today. Prominent atherosclerotic plaque in the left subclavian artery just proximal to the left vertebral artery origin resulting in moderate to severe stenosis, similar to the prior CTA. Right carotid system: Proximal common carotid artery is obscured by streak artifact from venous contrast. New stents are present in the more distal common carotid artery and proximal ICA, grossly patent though with assessment for residual/recurrent stenosis precluded by severe motion artifact. Left carotid system: Patent with calcified plaque at the carotid bifurcation. Reassessment of known stenosis precluded by severe motion artifact. Vertebral arteries: The vertebral arteries are patent and codominant. At most mild stenosis of the vertebral artery origins. Skeleton: Severe left-sided cervical facet arthrosis. Severe disc degeneration at C6-7. Other neck: No gross acute abnormality or mass. Upper chest: Grossly clear lung apices.  Prior CABG. Review of the MIP images confirms the above findings CTA HEAD FINDINGS Anterior circulation: Motion artifact precludes assessment of the majority of the intracranial ICAs. The distal supraclinoid ICAs and ICA termini are widely patent bilaterally. ACAs and MCAs are patent without evidence of proximal branch occlusion. The left A1 segment is hypoplastic. No aneurysm is identified. Posterior circulation: The intracranial vertebral arteries are patent with artifact versus severe stenosis of the right vertebrobasilar junction. No significant stenosis was present in this location previously which may favor artifact. The basilar artery is grossly patent with severe motion artifact through its midportion. The right PCA is patent without significant proximal stenosis. There  is a chronic severe left P1-P2 junction stenosis. No aneurysm is identified. Venous sinuses: As permitted by contrast timing and motion artifact, patent. Anatomic variants: Hypoplastic left A1. Delayed phase: Not performed as the  patient was agitated and trying to get off of the CT table. Review of the MIP images confirms the above findings IMPRESSION: 1. Severely motion degraded examination. 2. Patent posterior circulation with chronic severe proximal left PCA stenosis. 3. Artifact versus new severe stenosis at the right vertebrobasilar junction. 4. Grossly patent carotid arteries with interval right carotid stenting. Nondiagnostic assessment for stenosis or dissection. 5. Similar appearance of moderate to severe left subclavian artery stenosis just proximal to the vertebral origin. 6.  Aortic Atherosclerosis (ICD10-I70.0). Electronically Signed   By: Logan Bores M.D.   On: 10/10/2018 16:25   Mr Brain Wo Contrast  Result Date: 10/10/2018 CLINICAL DATA:  85 y/o M; bilateral vision loss and right-sided weakness. EXAM: MRI HEAD WITHOUT CONTRAST TECHNIQUE: Axial DWI sequence was acquired. The patient was unable to continue and additional sequences were not acquired. COMPARISON:  10/09/2018 CT head.  04/15/2018 MRI head. FINDINGS: Severe motion degraded axial DWI sequence. Right occipital lobe reduced diffusion consistent with acute/early subacute infarction. Chronic left PCA distribution infarction. Suboptimal assessment for hemorrhage, small stroke, extra-axial collection, or subtle mass effect. IMPRESSION: Severe motion degraded DWI sequence. 1. Acute/early subacute infarct of right occipital lobe. Suboptimal assessment for hemorrhage due to motion artifact. 2. Chronic left PCA distribution infarct. These results will be called to the ordering clinician or representative by the Radiologist Assistant, and communication documented in the PACS or zVision Dashboard. Electronically Signed   By: Kristine Garbe M.D.   On: 10/10/2018 15:58   Assessment: 85 y.o. male with past medical history of CAD, diabetes mellitus, glaucoma, hyperlipidemia, hypertension, MI, migraine headaches, left PCA infarct with residual weakness of right arm  and right leg, and right homonymous hemianopsia, right ICA stenosis status post stenting and paroxysmal atrial fibrillation on Eliquis presenting to the ED on 10/09/2018 with chief complaints of sudden onset vision loss and right-sided weakness.    Bilateral PCA strokes with poor posterior circulation. 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   - Agree with d/c plavix in setting of hematuria - d/c planning     10/12/2018, 11:37 AM

## 2018-10-12 NOTE — Progress Notes (Signed)
Pt discharged via wheelchair by RN and NT to the visitor's entrance

## 2018-10-12 NOTE — Discharge Instructions (Signed)
[facility] RN, Physical Therapy and Nurse Tech

## 2018-10-12 NOTE — Care Management Note (Addendum)
Case Management Note  Patient Details  Name: Kyle Richardson MRN: [id number] Date of Birth: 29-May-1928  Subjective/Objective:  Patient to be discharged per MD order. Orders in place for home health services. Patient used [facility] in the past and prefers to use them again. CMS Medicare.gov Compare Post Acute Care list reviewed with patient. Referral placed with J[facility] at [facility]. Patient has all needed DME. Will fax outpatient palliative referral as well.                     Action/Plan:   Expected Discharge Date:  10/12/18               Expected Discharge Plan:  [place]  In-House Referral:     Discharge planning Services  CM Consult  Post Acute Care Choice:  Home Health Choice offered to:  Patient, Adult Children  DME Arranged:    DME Agency:     HH Arranged:  RN, PT, Nurse's Aide [place] Agency:  [facility]  Status of Service:  Completed, signed off  If discussed at [place] of Stay Meetings, dates discussed:    Additional Comments:  Latanya Maudlin, RN 10/12/2018, 12:43 PM

## 2018-10-12 NOTE — Discharge Summary (Signed)
[facility] [place] NAME: Kyle Richardson    MR#:  [id number]  DATE OF BIRTH:  10-12-1927  DATE OF ADMISSION:  10/09/2018 ADMITTING PHYSICIAN: Arta Silence, MD  DATE OF DISCHARGE: 10/12/2018  PRIMARY CARE PHYSICIAN: Einar Pheasant, MD    ADMISSION DIAGNOSIS:  Weakness [R53.1] Cerebrovascular accident (CVA), unspecified mechanism ([place]) [I63.9]  DISCHARGE DIAGNOSIS:  Active Problems:   CVA (cerebral vascular accident) ([place])   Stroke (cerebrum) ([place])   SECONDARY DIAGNOSIS:   Past Medical History:  Diagnosis Date  . Abdominal fibromatosis   . 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  s/p PTCA of the mid RCA 1/96, also  s/p  CABG x 4 7/96  . Complication of anesthesia    confusion and hallucinations for several days after receiving Versed  . Degenerative disc disease   . Diabetes mellitus ([place])   . Glaucoma   . Hypercholesterolemia   . Hypertension   . Migraine headache    history of  . Myocardial infarction ([place]) 1996  . Osteoarthritis   . Sleep apnea   . Stroke [facility]) 02/22/2017   affected left side     HOSPITAL COURSE:   85 year old male with past medical history of previous CVA, obstructive sleep apnea, osteoarthritis, hypertension, hyperlipidemia, glaucoma, diabetes, coronary artery disease who presented to the hospital due to sudden onset of vision loss along with right-sided weakness.  1.  Sudden onset visual loss/right-sided weakness- due to CVA.  Patient's MRI of the brain was positive for acute/subacute right occipital lobe CVA. -As per neurology this is likely small vessel disease.  Continue dual antiplatelet therapy with aspirin, Eliquis.  Discontinue Plavix. -CTA of the head& neck showing severe PCA stenosis on the left and also new stenosis of the right vertebrobasliar artery.  - Seen by PT recommended inpatient rehab but patient likely is not a good candidate due to vascular  dementia would not be cooperative with therapy.  Patient has some sundowning and would benefit from going home with home health services.  2.  Hematuria-patient was having some dark urine in the hospital, and this is baseline for the patient as per the family given his recent ureteral stents.  Hemoglobin has remained stable. -Continue anticoagulation as mentioned above with aspirin, Eliquis.   3.  Altered mental status/encephalopathy-secondary to the stroke and underlying vascular dementia. -Patient still has elements of sundowning but it has improved since admission.  Discussed with patient's daughter and son-in-law at bedside and plan to discharge home with home health services and they are in agreement.  4.  History of atrial fibrillation-rate controlled.  Continue Toprol, Eliquis.  5.  Glaucoma- continue latanoprost eyedrops.  6.  HyperLipidemia-continue atorvastatin.  Discharge Home with [place].   DISCHARGE CONDITIONS:   Stable  CONSULTS OBTAINED:  Treatment Team:  Arta Silence, MD Leotis Pain, MD Catarina Hartshorn, MD  DRUG ALLERGIES:   Allergies  Allergen Reactions  . Fentanyl Other (See Comments)    Reaction: confusion  . Flomax [Tamsulosin Hcl] Other (See Comments)    Unknown  . Lamisil [Terbinafine] Other (See Comments)    Unknown  . Levofloxacin Other (See Comments)    Diplopia. NEVER put patient on levoquin!  . Lorazepam Other (See Comments)    Reaction: confusion/ hallucinations   . Metformin And Related Diarrhea  . Sertraline Other (See Comments)    Unknown  . Sulfa Antibiotics Other (See Comments)    Unknown  .  Versed [Midazolam] Other (See Comments)    Hallucinations and confusion  . Clarithromycin Diarrhea  . Codeine Other (See Comments)    Unknown    DISCHARGE MEDICATIONS:   Allergies as of 10/12/2018      Reactions   Fentanyl Other (See Comments)   Reaction: confusion   Flomax [tamsulosin Hcl] Other (See  Comments)   Unknown   Lamisil [terbinafine] Other (See Comments)   Unknown   Levofloxacin Other (See Comments)   Diplopia. NEVER put patient on levoquin!   Lorazepam Other (See Comments)   Reaction: confusion/ hallucinations    Metformin And Related Diarrhea   Sertraline Other (See Comments)   Unknown   Sulfa Antibiotics Other (See Comments)   Unknown   Versed [midazolam] Other (See Comments)   Hallucinations and confusion   Clarithromycin Diarrhea   Codeine Other (See Comments)   Unknown      Medication List    STOP taking these medications   acetaminophen 500 MG tablet Commonly known as:  TYLENOL   clopidogrel 75 MG tablet Commonly known as:  PLAVIX     TAKE these medications   apixaban 5 MG Tabs tablet Commonly known as:  ELIQUIS Take 1 tablet (5 mg total) by mouth 2 (two) times daily.   aspirin 81 MG EC tablet Take 1 tablet (81 mg total) by mouth daily.   atorvastatin 40 MG tablet Commonly known as:  LIPITOR TAKE 1 TABLET BY MOUTH DAILY FOR CHOLESTEROL What changed:  See the new instructions.   divalproex 500 MG 24 hr tablet Commonly known as:  DEPAKOTE ER Take 1 tablet (500 mg total) by mouth daily. What changed:  when to take this   fluticasone 50 MCG/ACT nasal spray Commonly known as:  FLONASE SPRAY 2 SPRAYS IN EACH NOSTRIL ONCE A DAY What changed:  See the new instructions.   latanoprost 0.005 % ophthalmic solution Commonly known as:  XALATAN Place 1 drop into both eyes at bedtime.   loratadine 10 MG tablet Commonly known as:  CLARITIN Take 10 mg by mouth daily.   MELATONIN ER PO Take 1 tablet by mouth at bedtime.   metoprolol succinate 25 MG 24 hr tablet Commonly known as:  TOPROL-XL Take 0.5 tablets (12.5 mg total) by mouth daily.   nitroGLYCERIN 0.4 MG SL tablet Commonly known as:  NITROSTAT Place 0.4 mg under the tongue every 5 (five) minutes as needed for chest pain.   PRESERVISION AREDS 2 PO Take 1 capsule by mouth 2 (two) times  daily.   tizanidine 2 MG capsule Commonly known as:  ZANAFLEX 1-2 capsules q hs prn What changed:    how much to take  how to take this  when to take this  additional instructions         DISCHARGE INSTRUCTIONS:   DIET:  Cardiac diet  DISCHARGE CONDITION:  Stable  ACTIVITY:  Activity as tolerated  OXYGEN:  Home Oxygen: No.   Oxygen Delivery: room air  DISCHARGE LOCATION:  Home with Home Health PT, RN, Aide.     If you experience worsening of your admission symptoms, develop shortness of breath, life threatening emergency, suicidal or homicidal thoughts you must seek medical attention immediately by calling 911 or calling your MD immediately  if symptoms less severe.  You Must read complete instructions/literature along with all the possible adverse reactions/side effects for all the Medicines you take and that have been prescribed to you. Take any new Medicines after you have completely understood and accpet  all the possible adverse reactions/side effects.   Please note  You were cared for by a hospitalist during your hospital stay. If you have any questions about your discharge medications or the care you received while you were in the hospital after you are discharged, you can call the unit and asked to speak with the hospitalist on call if the hospitalist that took care of you is not available. Once you are discharged, your primary care physician will handle any further medical issues. Please note that NO REFILLS for any discharge medications will be authorized once you are discharged, as it is imperative that you return to your primary care physician (or establish a relationship with a primary care physician if you do not have one) for your aftercare needs so that they can reassess your need for medications and monitor your lab values.     Today   Mental status much improved since admission, patient following some simple commands and eating this morning.   Patient's family at bedside.  They are okay with discharge to home with home health services.  VITAL SIGNS:  Blood pressure (!) 147/61, pulse 62, temperature 97.6 F (36.4 C), resp. rate 18, height 5\' 6"  (1.676 m), weight 81.6 kg, SpO2 96 %.  I/O:  No intake or output data in the 24 hours ending 10/12/18 1405  PHYSICAL EXAMINATION:   GENERAL:  85 y.o.-year-old patient lying in bed in NAD.  EYES: Pupils equal, round, reactive to light. No scleral icterus. Extraocular muscles intact.  HEENT: Head atraumatic, normocephalic. Oropharynx and nasopharynx clear.  NECK:  Supple, no jugular venous distention. No thyroid enlargement, no tenderness.  LUNGS: Poor Resp. effort, no wheezing, rales, rhonchi. No use of accessory muscles of respiration.  CARDIOVASCULAR: S1, S2 normal. No murmurs, rubs, or gallops.  ABDOMEN: Soft, nontender, nondistended. Bowel sounds present. No organomegaly or mass.  EXTREMITIES: No cyanosis, clubbing or edema b/l.    NEUROLOGIC: Cranial nerves II through XII are intact. No focal Motor or sensory deficits b/l. Globally weak but follows some simple commands.   PSYCHIATRIC: The patient is alert and oriented x 1.  SKIN: No obvious rash, lesion, or ulcer.   DATA REVIEW:   CBC Recent Labs  Lab 10/09/18 2229 10/11/18 1658  WBC 6.6  --   HGB 13.4 14.4  HCT 40.3  --   PLT 189  --     Chemistries  Recent Labs  Lab 10/09/18 2229 10/10/18 0318  NA 138  --   K 3.6  --   CL 105  --   CO2 26  --   GLUCOSE 187*  --   BUN 16  --   CREATININE 0.93  --   CALCIUM 8.6*  --   MG  --  2.0  AST  --  21  ALT  --  20  ALKPHOS  --  2  BILITOT  --  0.5    Cardiac Enzymes Recent Labs  Lab 10/09/18 2229  TROPONINI <0.03    Microbiology Results  Results for orders placed or performed during the hospital encounter of 10/09/18  Urine Culture     Status: Abnormal   Collection Time: 10/09/18 11:50 PM  Result Value Ref Range Status   Specimen Description   Final     URINE, RANDOM Performed at [facility], [address], [place],  [address]    Special Requests   Final    NONE Performed at [facility], [address] [place],  [place] [address]    Culture (A)  Final    <10,000 COLONIES/mL INSIGNIFICANT GROWTH Performed at [facility], [place] [address], [place], [place] [address]    Report Status 10/11/2018 FINAL  Final    RADIOLOGY:  Ct Angio Head W Or Wo Contrast  Result Date: 10/10/2018 CLINICAL DATA:  Acute vision loss. Acute right PCA infarct on limited MRI today. EXAM: CT ANGIOGRAPHY HEAD AND NECK TECHNIQUE: Multidetector CT imaging of the head and neck was performed using the standard protocol during bolus administration of intravenous contrast. Multiplanar CT image reconstructions and MIPs were obtained to evaluate the vascular anatomy. Carotid stenosis measurements (when applicable) are obtained utilizing NASCET criteria, using the distal internal carotid diameter as the denominator. CONTRAST:  25mL OMNIPAQUE IOHEXOL 350 MG/ML SOLN COMPARISON:  Neck CTA 04/16/2018.  Head MRA 04/15/2018. FINDINGS: The study suffers from intermittent, up to severe motion artifact due to patient agitation. CTA NECK FINDINGS Aortic arch: Standard 3 vessel aortic arch with moderate atherosclerotic plaque. No significant arch vessel origin stenosis. Partially visualized mild chronic aneurysmal dilatation of the ascending aorta, not progressive from the prior CTA as imaged today. Prominent atherosclerotic plaque in the left subclavian artery just proximal to the left vertebral artery origin resulting in moderate to severe stenosis, similar to the prior CTA. Right carotid system: Proximal common carotid artery is obscured by streak artifact from venous contrast. New stents are present in the more distal common carotid artery and proximal ICA, grossly patent though with assessment for residual/recurrent stenosis precluded by severe motion  artifact. Left carotid system: Patent with calcified plaque at the carotid bifurcation. Reassessment of known stenosis precluded by severe motion artifact. Vertebral arteries: The vertebral arteries are patent and codominant. At most mild stenosis of the vertebral artery origins. Skeleton: Severe left-sided cervical facet arthrosis. Severe disc degeneration at C6-7. Other neck: No gross acute abnormality or mass. Upper chest: Grossly clear lung apices.  Prior CABG. Review of the MIP images confirms the above findings CTA HEAD FINDINGS Anterior circulation: Motion artifact precludes assessment of the majority of the intracranial ICAs. The distal supraclinoid ICAs and ICA termini are widely patent bilaterally. ACAs and MCAs are patent without evidence of proximal branch occlusion. The left A1 segment is hypoplastic. No aneurysm is identified. Posterior circulation: The intracranial vertebral arteries are patent with artifact versus severe stenosis of the right vertebrobasilar junction. No significant stenosis was present in this location previously which may favor artifact. The basilar artery is grossly patent with severe motion artifact through its midportion. The right PCA is patent without significant proximal stenosis. There is a chronic severe left P1-P2 junction stenosis. No aneurysm is identified. Venous sinuses: As permitted by contrast timing and motion artifact, patent. Anatomic variants: Hypoplastic left A1. Delayed phase: Not performed as the patient was agitated and trying to get off of the CT table. Review of the MIP images confirms the above findings IMPRESSION: 1. Severely motion degraded examination. 2. Patent posterior circulation with chronic severe proximal left PCA stenosis. 3. Artifact versus new severe stenosis at the right vertebrobasilar junction. 4. Grossly patent carotid arteries with interval right carotid stenting. Nondiagnostic assessment for stenosis or dissection. 5. Similar appearance  of moderate to severe left subclavian artery stenosis just proximal to the vertebral origin. 6.  Aortic Atherosclerosis (ICD10-I70.0). Electronically Signed   By: Logan Bores M.D.   On: 10/10/2018 16:25   Ct Angio Neck W Or Wo Contrast  Result Date: 10/10/2018 CLINICAL DATA:  Acute vision loss.  Acute right PCA infarct on limited MRI today. EXAM: CT ANGIOGRAPHY HEAD AND NECK TECHNIQUE: Multidetector CT imaging of the head and neck was performed using the standard protocol during bolus administration of intravenous contrast. Multiplanar CT image reconstructions and MIPs were obtained to evaluate the vascular anatomy. Carotid stenosis measurements (when applicable) are obtained utilizing NASCET criteria, using the distal internal carotid diameter as the denominator. CONTRAST:  57mL OMNIPAQUE IOHEXOL 350 MG/ML SOLN COMPARISON:  Neck CTA 04/16/2018.  Head MRA 04/15/2018. FINDINGS: The study suffers from intermittent, up to severe motion artifact due to patient agitation. CTA NECK FINDINGS Aortic arch: Standard 3 vessel aortic arch with moderate atherosclerotic plaque. No significant arch vessel origin stenosis. Partially visualized mild chronic aneurysmal dilatation of the ascending aorta, not progressive from the prior CTA as imaged today. Prominent atherosclerotic plaque in the left subclavian artery just proximal to the left vertebral artery origin resulting in moderate to severe stenosis, similar to the prior CTA. Right carotid system: Proximal common carotid artery is obscured by streak artifact from venous contrast. New stents are present in the more distal common carotid artery and proximal ICA, grossly patent though with assessment for residual/recurrent stenosis precluded by severe motion artifact. Left carotid system: Patent with calcified plaque at the carotid bifurcation. Reassessment of known stenosis precluded by severe motion artifact. Vertebral arteries: The vertebral arteries are patent and  codominant. At most mild stenosis of the vertebral artery origins. Skeleton: Severe left-sided cervical facet arthrosis. Severe disc degeneration at C6-7. Other neck: No gross acute abnormality or mass. Upper chest: Grossly clear lung apices.  Prior CABG. Review of the MIP images confirms the above findings CTA HEAD FINDINGS Anterior circulation: Motion artifact precludes assessment of the majority of the intracranial ICAs. The distal supraclinoid ICAs and ICA termini are widely patent bilaterally. ACAs and MCAs are patent without evidence of proximal branch occlusion. The left A1 segment is hypoplastic. No aneurysm is identified. Posterior circulation: The intracranial vertebral arteries are patent with artifact versus severe stenosis of the right vertebrobasilar junction. No significant stenosis was present in this location previously which may favor artifact. The basilar artery is grossly patent with severe motion artifact through its midportion. The right PCA is patent without significant proximal stenosis. There is a chronic severe left P1-P2 junction stenosis. No aneurysm is identified. Venous sinuses: As permitted by contrast timing and motion artifact, patent. Anatomic variants: Hypoplastic left A1. Delayed phase: Not performed as the patient was agitated and trying to get off of the CT table. Review of the MIP images confirms the above findings IMPRESSION: 1. Severely motion degraded examination. 2. Patent posterior circulation with chronic severe proximal left PCA stenosis. 3. Artifact versus new severe stenosis at the right vertebrobasilar junction. 4. Grossly patent carotid arteries with interval right carotid stenting. Nondiagnostic assessment for stenosis or dissection. 5. Similar appearance of moderate to severe left subclavian artery stenosis just proximal to the vertebral origin. 6.  Aortic Atherosclerosis (ICD10-I70.0). Electronically Signed   By: Logan Bores M.D.   On: 10/10/2018 16:25   Mr  Brain Wo Contrast  Result Date: 10/10/2018 CLINICAL DATA:  85 y/o M; bilateral vision loss and right-sided weakness. EXAM: MRI HEAD WITHOUT CONTRAST TECHNIQUE: Axial DWI sequence was acquired. The patient was unable to continue and additional sequences were not acquired. COMPARISON:  10/09/2018 CT head.  04/15/2018 MRI head. FINDINGS: Severe motion degraded axial DWI sequence. Right occipital lobe reduced diffusion consistent with acute/early subacute infarction. Chronic left PCA distribution infarction. Suboptimal assessment for  hemorrhage, small stroke, extra-axial collection, or subtle mass effect. IMPRESSION: Severe motion degraded DWI sequence. 1. Acute/early subacute infarct of right occipital lobe. Suboptimal assessment for hemorrhage due to motion artifact. 2. Chronic left PCA distribution infarct. These results will be called to the ordering clinician or representative by the Radiologist Assistant, and communication documented in the PACS or zVision Dashboard. Electronically Signed   By: Kristine Garbe M.D.   On: 10/10/2018 15:58      Management plans discussed with the patient, family and they are in agreement.  CODE STATUS:     Code Status Orders  (From admission, onward)         Start     Ordered   10/10/18 0255  Full code  Continuous     10/10/18 0254          TOTAL TIME TAKING CARE OF THIS PATIENT: 45 minutes.    Henreitta Leber M.D on 10/12/2018 at 2:05 PM  Between 7am to 6pm - Pager - [phone]  After 6pm go to [email or web address] - Proofreader  [facility] [place] [facility]  Office  [phone]  CC: Primary care physician; Einar Pheasant, MD

## 2018-10-14 ENCOUNTER — Ambulatory Visit: Payer: PPO

## 2018-10-14 ENCOUNTER — Telehealth: Payer: Self-pay | Admitting: Internal Medicine

## 2018-10-14 DIAGNOSIS — Z7982 Long term (current) use of aspirin: Secondary | ICD-10-CM | POA: Diagnosis not present

## 2018-10-14 DIAGNOSIS — I10 Essential (primary) hypertension: Secondary | ICD-10-CM | POA: Diagnosis not present

## 2018-10-14 DIAGNOSIS — Z951 Presence of aortocoronary bypass graft: Secondary | ICD-10-CM | POA: Diagnosis not present

## 2018-10-14 DIAGNOSIS — R319 Hematuria, unspecified: Secondary | ICD-10-CM | POA: Diagnosis not present

## 2018-10-14 DIAGNOSIS — E785 Hyperlipidemia, unspecified: Secondary | ICD-10-CM | POA: Diagnosis not present

## 2018-10-14 DIAGNOSIS — Z7902 Long term (current) use of antithrombotics/antiplatelets: Secondary | ICD-10-CM | POA: Diagnosis not present

## 2018-10-14 DIAGNOSIS — H9193 Unspecified hearing loss, bilateral: Secondary | ICD-10-CM | POA: Diagnosis not present

## 2018-10-14 DIAGNOSIS — H5460 Unqualified visual loss, one eye, unspecified: Secondary | ICD-10-CM | POA: Diagnosis not present

## 2018-10-14 DIAGNOSIS — I480 Paroxysmal atrial fibrillation: Secondary | ICD-10-CM | POA: Diagnosis not present

## 2018-10-14 DIAGNOSIS — E119 Type 2 diabetes mellitus without complications: Secondary | ICD-10-CM | POA: Diagnosis not present

## 2018-10-14 DIAGNOSIS — I69398 Other sequelae of cerebral infarction: Secondary | ICD-10-CM | POA: Diagnosis not present

## 2018-10-14 DIAGNOSIS — F05 Delirium due to known physiological condition: Secondary | ICD-10-CM | POA: Diagnosis not present

## 2018-10-14 DIAGNOSIS — I69351 Hemiplegia and hemiparesis following cerebral infarction affecting right dominant side: Secondary | ICD-10-CM | POA: Diagnosis not present

## 2018-10-14 DIAGNOSIS — H409 Unspecified glaucoma: Secondary | ICD-10-CM | POA: Diagnosis not present

## 2018-10-14 DIAGNOSIS — I2510 Atherosclerotic heart disease of native coronary artery without angina pectoris: Secondary | ICD-10-CM | POA: Diagnosis not present

## 2018-10-14 DIAGNOSIS — M1990 Unspecified osteoarthritis, unspecified site: Secondary | ICD-10-CM | POA: Diagnosis not present

## 2018-10-14 DIAGNOSIS — F0391 Unspecified dementia with behavioral disturbance: Secondary | ICD-10-CM | POA: Diagnosis not present

## 2018-10-14 DIAGNOSIS — Z960 Presence of urogenital implants: Secondary | ICD-10-CM | POA: Diagnosis not present

## 2018-10-14 DIAGNOSIS — Z7901 Long term (current) use of anticoagulants: Secondary | ICD-10-CM | POA: Diagnosis not present

## 2018-10-14 DIAGNOSIS — M5136 Other intervertebral disc degeneration, lumbar region: Secondary | ICD-10-CM | POA: Diagnosis not present

## 2018-10-14 DIAGNOSIS — G4733 Obstructive sleep apnea (adult) (pediatric): Secondary | ICD-10-CM | POA: Diagnosis not present

## 2018-10-14 DIAGNOSIS — I252 Old myocardial infarction: Secondary | ICD-10-CM | POA: Diagnosis not present

## 2018-10-14 DIAGNOSIS — G4700 Insomnia, unspecified: Secondary | ICD-10-CM | POA: Diagnosis not present

## 2018-10-14 NOTE — Telephone Encounter (Signed)
Transition Care Management Follow-up Telephone Call  How have you been since you were released from the hospital? Patient wife DPR stated the CVA has effected his vision.  Patient DPR stated he has very little vision.   Do you understand why you were in the hospital? yes   Do you understand the discharge instrcutions? yes  Items Reviewed:  Medications reviewed: yes  Allergies reviewed: yes  Dietary changes reviewed: yes  Referrals reviewed: yes   Functional Questionnaire:   Activities of Daily Living (ADLs):   He states they are independent in the following: ambulation, bathing and hygiene, continence, grooming, toileting and dressing States they require assistance with the following: feeding   Any transportation issues/concerns?: no   Any patient concerns? no   Confirmed importance and date/time of follow-up visits scheduled: yes   Confirmed with patient if condition begins to worsen call PCP or go to the ER.  Patient was given the Call-a-Nurse line [phone]: yes

## 2018-10-15 ENCOUNTER — Telehealth: Payer: Self-pay | Admitting: Internal Medicine

## 2018-10-15 ENCOUNTER — Other Ambulatory Visit: Payer: Self-pay | Admitting: Student-PharmD

## 2018-10-15 ENCOUNTER — Ambulatory Visit: Payer: PPO

## 2018-10-15 ENCOUNTER — Encounter (INDEPENDENT_AMBULATORY_CARE_PROVIDER_SITE_OTHER): Payer: Self-pay

## 2018-10-15 NOTE — Telephone Encounter (Signed)
Copied from [place] [id number]. Topic: Quick Communication - Home Health Verbal Orders >> Oct 15, 2018  9:54 AM Conception Chancy, NT wrote: Caller/Agency: [place] Number: [phone] Requesting OT/PT/Skilled Nursing/Social Work: PT Frequency: 2 week 3/

## 2018-10-15 NOTE — Telephone Encounter (Unsigned)
Copied from [place] [phone]. Topic: Quick Communication - Home Health Verbal Orders >> Oct 15, 2018 10:19 AM Carolyn Stare wrote: Caller/Agency Amy with  [place] Number  [phone]   [id number]  verbal orders Nursing  Frequency 1 x 5

## 2018-10-15 NOTE — Patient Outreach (Addendum)
Kyle Richardson [facility]) Care Management  [place]   10/15/2018  Kyle Richardson 1928-08-10 [id number]  Reason for referral: Medication Reconciliation Post Discharge  Referral source: [employer] Current insurance:[employer]  PMHx includes but not limited to: Recurrent CVAs  (5/18, 7/19), CAD with MI s/p CABG 1996), T2DM, Atrial Fibrillation on chronic anticoagulation, Migraines, Glaucoma, HLD, ureteral stone (recent stent 08/05/18) with plans for uretal catheterization on 10/15/18 and recent hospitalization from 1/1-10/12/2018 for new CVA. Patient discharged home with home health services on apixaban and aspirin. Per [facility] review, clopidogrel was to be discontinued in December after 6 months of therapy per neurology.  Unsuccessful call attempt #1 to patient. HIPAA compliant. Voicemail left.   Plan: Will try to reach patient again within 3-4 business days.  Justice Deeds, PharmD Candidate Class of 2020 [place]  Addendum: I agree with above.    Ralene Bathe, PharmD, [place] [phone]

## 2018-10-15 NOTE — Telephone Encounter (Signed)
Verbal orders given  

## 2018-10-16 ENCOUNTER — Ambulatory Visit: Payer: PPO

## 2018-10-17 ENCOUNTER — Other Ambulatory Visit: Payer: Self-pay | Admitting: Internal Medicine

## 2018-10-17 ENCOUNTER — Encounter: Payer: Self-pay | Admitting: Internal Medicine

## 2018-10-17 ENCOUNTER — Ambulatory Visit (INDEPENDENT_AMBULATORY_CARE_PROVIDER_SITE_OTHER): Payer: PPO | Admitting: Internal Medicine

## 2018-10-17 ENCOUNTER — Other Ambulatory Visit: Payer: Self-pay | Admitting: Pharmacist

## 2018-10-17 VITALS — BP 116/54 | HR 70 | Temp 97.8°F | Resp 16

## 2018-10-17 DIAGNOSIS — R82998 Other abnormal findings in urine: Secondary | ICD-10-CM | POA: Diagnosis not present

## 2018-10-17 DIAGNOSIS — I69359 Hemiplegia and hemiparesis following cerebral infarction affecting unspecified side: Secondary | ICD-10-CM

## 2018-10-17 DIAGNOSIS — E7800 Pure hypercholesterolemia, unspecified: Secondary | ICD-10-CM | POA: Diagnosis not present

## 2018-10-17 DIAGNOSIS — I10 Essential (primary) hypertension: Secondary | ICD-10-CM

## 2018-10-17 DIAGNOSIS — R569 Unspecified convulsions: Secondary | ICD-10-CM

## 2018-10-17 DIAGNOSIS — I2510 Atherosclerotic heart disease of native coronary artery without angina pectoris: Secondary | ICD-10-CM

## 2018-10-17 DIAGNOSIS — N1330 Unspecified hydronephrosis: Secondary | ICD-10-CM

## 2018-10-17 DIAGNOSIS — I7121 Aneurysm of the ascending aorta, without rupture: Secondary | ICD-10-CM

## 2018-10-17 DIAGNOSIS — R404 Transient alteration of awareness: Secondary | ICD-10-CM

## 2018-10-17 DIAGNOSIS — I480 Paroxysmal atrial fibrillation: Secondary | ICD-10-CM

## 2018-10-17 DIAGNOSIS — I712 Thoracic aortic aneurysm, without rupture: Secondary | ICD-10-CM

## 2018-10-17 DIAGNOSIS — E119 Type 2 diabetes mellitus without complications: Secondary | ICD-10-CM | POA: Diagnosis not present

## 2018-10-17 DIAGNOSIS — R9389 Abnormal findings on diagnostic imaging of other specified body structures: Secondary | ICD-10-CM | POA: Diagnosis not present

## 2018-10-17 DIAGNOSIS — I63532 Cerebral infarction due to unspecified occlusion or stenosis of left posterior cerebral artery: Secondary | ICD-10-CM | POA: Diagnosis not present

## 2018-10-17 LAB — URINALYSIS, ROUTINE W REFLEX MICROSCOPIC
Bilirubin Urine: NEGATIVE
Ketones, ur: 15 — AB
Nitrite: POSITIVE — AB
Specific Gravity, Urine: 1.025 (ref 1.000–1.030)
Total Protein, Urine: 100 — AB
UROBILINOGEN UA: 1 (ref 0.0–1.0)
Urine Glucose: 100 — AB
pH: 6.5 (ref 5.0–8.0)

## 2018-10-17 NOTE — Patient Outreach (Addendum)
Kyle Richardson [facility]) Care Management  [place]  10/17/2018  Kyle Richardson November 19, 1927 [id number]   Reason for call:Medication Reconciliation Post Discharge   Unsuccessful telephone call attempt #2 to patient.   HIPAA compliant voicemail left requesting a return call  Per [facility], patient had visit with PCP earlier today at which medications appear to have been reviewed and reconciled.  Plan:  I will make another outreach attempt to patient within 3-4 business days  [employer] PharmD Candidate, Class of 2020 [place], PharmD, [place] [phone]   ADDENDUM: Return call received from spouse, Kyle Richardson. Confirms patient had office visit where all medicines were reviewed. She denies any concerns or questions and does not wish to review medications again with me.  Medication assistance was discussed briefly. I reviewed patient assistance options with Medicare Extra Help and patient assistance programs. At this time, patient is not eligible for any patient assistance options. However, I gave her my phone number to call in the future if she feels they meet the 3% out of pocket spending criteria.  Spouse voiced understanding.   Plan: I will close [facility] pharmacy case at this time.  I am happy to assist in the future as needed.    Kyle Richardson, PharmD, [place] [phone]

## 2018-10-17 NOTE — Progress Notes (Signed)
Patient ID: Kyle Richardson, male   DOB: 1928/05/25, 85 y.o.   MRN: [id number]   Subjective:    Patient ID: Kyle Richardson, male    DOB: [birth year], 85 y.o.   MRN: [id number]  HPI  Patient here for hospital follow up.  He is accompanied by his wife and daughter.  History obtained from all of them.  He was admitted from 10/09/18 - 10/12/18 and diagnosed with CVA.  Presented with sudden loss of vision along with right sided weakness.  His MRI was positive for acute/subacute right occipital lobe CVA.  Neurology evaluated.  Felt likely to be small vessel disease.  Recommended continuing dual antiplatelet therapy with aspirin and eliquis.  plavix stopped.  CTA of the head and neck revealed severe PCA stenosis on the left and also new stenosis of the right vertebrobasilar artery.  PT evaluated and recommend inpatient PT.  Was felt to not be a good candidate secondary to vascular dementia and sundowning.  Therapy has evaluated him at home and plans to work with him.  They report improvement.  Able to do more with feeding himself, etc.  Was noted to have hematuria.  Has ureteral stents and is seeing urology.  Plans to f/u with urology and is planning for f/u CT scan.  No chest pain.  Breathing stable.  Eating.  No vomiting or nausea.  Bowels are moving.  Still some issues with sleeping.  Slept better last night.  Discussed with daughter.  Would like to avoid sleeping pills at this time.     Past Medical History:  Diagnosis Date  . Abdominal fibromatosis   . 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  s/p PTCA of the mid RCA 1/96, also  s/p  CABG x 4 7/96  . Complication of anesthesia    confusion and hallucinations for several days after receiving Versed  . Degenerative disc disease   . Diabetes mellitus ([place])   . Glaucoma   . Hypercholesterolemia   . Hypertension   . Migraine headache    history of  . Myocardial infarction ([place]) 1996  . Osteoarthritis   . Sleep apnea   .  Stroke [facility]) 02/22/2017   affected left side    Past Surgical History:  Procedure Laterality Date  . APPENDECTOMY    . CAROTID ANGIOGRAPHY Bilateral 04/19/2018   Procedure: CAROTID ANGIOGRAPHY;  Surgeon: Algernon Huxley, MD;  Location: [place] [facility];  Service: Cardiovascular;  Laterality: Bilateral;  . CAROTID PTA/STENT INTERVENTION Right 05/22/2018   Procedure: CAROTID PTA/STENT INTERVENTION;  Surgeon: Katha Cabal, MD;  Location: [place] [facility];  Service: Cardiovascular;  Laterality: Right;  . CATARACT EXTRACTION W/ INTRAOCULAR LENS  IMPLANT, BILATERAL    . CHOLECYSTECTOMY     Removed  . CORONARY ARTERY BYPASS GRAFT  7/96   x4  . HEMORRHOID SURGERY    . HERNIA REPAIR    . TONSILLECTOMY    . UPP and tonsillectomy  1/86   Family History  Problem Relation Age of Onset  . Heart disease Mother        died age 35  . Heart disease Father        and other vascular disease  . Diabetes Other        four siblings  . Heart disease Brother        s/p CABG  . Colon cancer Neg Hx   . Prostate cancer Neg Hx    Social  History   Socioeconomic History  . Marital status: Married    Spouse name: [name]  . Number of children: 2  . Years of education: come college  . Highest education level: Associate degree: occupational, Hotel manager, or vocational program  Occupational History  . Occupation: retired    Fish farm manager: [employer]  Social Needs  . Financial resource strain: Not hard at all  . Food insecurity:    Worry: Never true    Inability: Never true  . Transportation needs:    Medical: No    Non-medical: No  Tobacco Use  . Smoking status: Never Smoker  . Smokeless tobacco: Never Used  Substance and Sexual Activity  . Alcohol use: No    Alcohol/week: 0.0 standard drinks  . Drug use: No  . Sexual activity: Not Currently  Lifestyle  . Physical activity:    Days per week: 0 days    Minutes per session: 0 min  . Stress: Not at all  Relationships  . Social connections:     Talks on phone: Never    Gets together: Three times a week    Attends religious service: More than 4 times per year    Active member of club or organization: No    Attends meetings of clubs or organizations: Never    Relationship status: Married  Other Topics Concern  . Not on file  Social History Narrative  . Not on file    Outpatient Encounter Medications as of 10/17/2018  Medication Sig  . apixaban (ELIQUIS) 5 MG TABS tablet Take 1 tablet (5 mg total) by mouth 2 (two) times daily.  Marland Kitchen aspirin EC 81 MG EC tablet Take 1 tablet (81 mg total) by mouth daily.  Marland Kitchen atorvastatin (LIPITOR) 40 MG tablet TAKE 1 TABLET BY MOUTH DAILY FOR CHOLESTEROL (Patient taking differently: Take 40 mg by mouth at bedtime. )  . fluticasone (FLONASE) 50 MCG/ACT nasal spray SPRAY 2 SPRAYS IN EACH NOSTRIL ONCE A DAY (Patient taking differently: Place 1 spray into both nostrils at bedtime. )  . latanoprost (XALATAN) 0.005 % ophthalmic solution Place 1 drop into both eyes at bedtime.  Marland Kitchen loratadine (CLARITIN) 10 MG tablet Take 10 mg by mouth daily.  Marland Kitchen MELATONIN ER PO Take 1 tablet by mouth at bedtime.   . metoprolol succinate (TOPROL-XL) 25 MG 24 hr tablet Take 0.5 tablets (12.5 mg total) by mouth daily.  . nitroGLYCERIN (NITROSTAT) 0.4 MG SL tablet Place 0.4 mg under the tongue every 5 (five) minutes as needed for chest pain.   . tizanidine (ZANAFLEX) 2 MG capsule 1-2 capsules q hs prn (Patient taking differently: Take 2 mg by mouth at bedtime. )  . [DISCONTINUED] divalproex (DEPAKOTE ER) 500 MG 24 hr tablet Take 1 tablet (500 mg total) by mouth daily. (Patient taking differently: Take 500 mg by mouth at bedtime. )  . [DISCONTINUED] Multiple Vitamins-Minerals (PRESERVISION AREDS 2 PO) Take 1 capsule by mouth 2 (two) times daily.   No facility-administered encounter medications on file as of 10/17/2018.     Review of Systems  Constitutional: Negative for appetite change and unexpected weight change.  HENT: Negative for  congestion and sinus pressure.   Eyes: Positive for visual disturbance.  Respiratory: Negative for cough, chest tightness and shortness of breath.   Cardiovascular: Negative for chest pain and palpitations.  Gastrointestinal: Negative for abdominal pain, diarrhea and nausea.  Genitourinary: Positive for hematuria. Negative for difficulty urinating and dysuria.  Musculoskeletal: Negative for joint swelling and myalgias.  Skin:  Negative for color change and rash.  Neurological: Negative for dizziness and headaches.  Psychiatric/Behavioral: Negative for agitation and dysphoric mood.       Some sundowning.  Sleep issues as outlined.         Objective:    Physical Exam Constitutional:      General: He is not in acute distress.    Appearance: Normal appearance. He is well-developed.  HENT:     Nose: No congestion or rhinorrhea.     Mouth/Throat:     Pharynx: No oropharyngeal exudate or posterior oropharyngeal erythema.  Cardiovascular:     Rate and Rhythm: Normal rate.     Comments: Rate controlled.   Pulmonary:     Effort: Pulmonary effort is normal. No respiratory distress.     Breath sounds: Normal breath sounds.  Abdominal:     General: Bowel sounds are normal.     Palpations: Abdomen is soft.     Tenderness: There is no abdominal tenderness.  Musculoskeletal:        General: No swelling or tenderness.  Skin:    Findings: No erythema or rash.  Neurological:     Mental Status: He is alert.     Comments: Following commands.    Psychiatric:        Mood and Affect: Mood normal.        Behavior: Behavior normal.     BP (!) 116/54 (BP Location: Left Arm, Patient Position: Sitting, Cuff Size: Normal)   Pulse 70   Temp 97.8 F (36.6 C) (Oral)   Resp 16   SpO2 99%  Wt Readings from Last 3 Encounters:  10/09/18 180 lb (81.6 kg)  08/04/18 165 lb (74.8 kg)  07/25/18 163 lb (73.9 kg)     Lab Results  Component Value Date   WBC 6.6 10/09/2018   HGB 14.4 10/11/2018    HCT 40.3 10/09/2018   PLT 189 10/09/2018   GLUCOSE 187 (H) 10/09/2018   CHOL 159 04/16/2018   TRIG 288 (H) 04/16/2018   HDL 35 (L) 04/16/2018   LDLDIRECT 81.0 10/12/2017   LDLCALC 66 04/16/2018   ALT 20 10/10/2018   AST 21 10/10/2018   NA 138 10/09/2018   K 3.6 10/09/2018   CL 105 10/09/2018   CREATININE 0.93 10/09/2018   BUN 16 10/09/2018   CO2 26 10/09/2018   TSH 2.098 03/29/2018   PSA 0.66 05/05/2016   INR 1.05 10/09/2018   HGBA1C 6.8 (H) 04/16/2018   MICROALBUR 2.2 (H) 10/12/2017    Ct Angio Head W Or Wo Contrast  Result Date: 10/10/2018 CLINICAL DATA:  Acute vision loss. Acute right PCA infarct on limited MRI today. EXAM: CT ANGIOGRAPHY HEAD AND NECK TECHNIQUE: Multidetector CT imaging of the head and neck was performed using the standard protocol during bolus administration of intravenous contrast. Multiplanar CT image reconstructions and MIPs were obtained to evaluate the vascular anatomy. Carotid stenosis measurements (when applicable) are obtained utilizing NASCET criteria, using the distal internal carotid diameter as the denominator. CONTRAST:  18m OMNIPAQUE IOHEXOL 350 MG/ML SOLN COMPARISON:  Neck CTA 04/16/2018.  Head MRA 04/15/2018. FINDINGS: The study suffers from intermittent, up to severe motion artifact due to patient agitation. CTA NECK FINDINGS Aortic arch: Standard 3 vessel aortic arch with moderate atherosclerotic plaque. No significant arch vessel origin stenosis. Partially visualized mild chronic aneurysmal dilatation of the ascending aorta, not progressive from the prior CTA as imaged today. Prominent atherosclerotic plaque in the left subclavian artery just proximal to the  left vertebral artery origin resulting in moderate to severe stenosis, similar to the prior CTA. Right carotid system: Proximal common carotid artery is obscured by streak artifact from venous contrast. New stents are present in the more distal common carotid artery and proximal ICA, grossly  patent though with assessment for residual/recurrent stenosis precluded by severe motion artifact. Left carotid system: Patent with calcified plaque at the carotid bifurcation. Reassessment of known stenosis precluded by severe motion artifact. Vertebral arteries: The vertebral arteries are patent and codominant. At most mild stenosis of the vertebral artery origins. Skeleton: Severe left-sided cervical facet arthrosis. Severe disc degeneration at C6-7. Other neck: No gross acute abnormality or mass. Upper chest: Grossly clear lung apices.  Prior CABG. Review of the MIP images confirms the above findings CTA HEAD FINDINGS Anterior circulation: Motion artifact precludes assessment of the majority of the intracranial ICAs. The distal supraclinoid ICAs and ICA termini are widely patent bilaterally. ACAs and MCAs are patent without evidence of proximal branch occlusion. The left A1 segment is hypoplastic. No aneurysm is identified. Posterior circulation: The intracranial vertebral arteries are patent with artifact versus severe stenosis of the right vertebrobasilar junction. No significant stenosis was present in this location previously which may favor artifact. The basilar artery is grossly patent with severe motion artifact through its midportion. The right PCA is patent without significant proximal stenosis. There is a chronic severe left P1-P2 junction stenosis. No aneurysm is identified. Venous sinuses: As permitted by contrast timing and motion artifact, patent. Anatomic variants: Hypoplastic left A1. Delayed phase: Not performed as the patient was agitated and trying to get off of the CT table. Review of the MIP images confirms the above findings IMPRESSION: 1. Severely motion degraded examination. 2. Patent posterior circulation with chronic severe proximal left PCA stenosis. 3. Artifact versus new severe stenosis at the right vertebrobasilar junction. 4. Grossly patent carotid arteries with interval right  carotid stenting. Nondiagnostic assessment for stenosis or dissection. 5. Similar appearance of moderate to severe left subclavian artery stenosis just proximal to the vertebral origin. 6.  Aortic Atherosclerosis (ICD10-I70.0). Electronically Signed   By: Logan Bores M.D.   On: 10/10/2018 16:25   Ct Angio Neck W Or Wo Contrast  Result Date: 10/10/2018 CLINICAL DATA:  Acute vision loss. Acute right PCA infarct on limited MRI today. EXAM: CT ANGIOGRAPHY HEAD AND NECK TECHNIQUE: Multidetector CT imaging of the head and neck was performed using the standard protocol during bolus administration of intravenous contrast. Multiplanar CT image reconstructions and MIPs were obtained to evaluate the vascular anatomy. Carotid stenosis measurements (when applicable) are obtained utilizing NASCET criteria, using the distal internal carotid diameter as the denominator. CONTRAST:  20m OMNIPAQUE IOHEXOL 350 MG/ML SOLN COMPARISON:  Neck CTA 04/16/2018.  Head MRA 04/15/2018. FINDINGS: The study suffers from intermittent, up to severe motion artifact due to patient agitation. CTA NECK FINDINGS Aortic arch: Standard 3 vessel aortic arch with moderate atherosclerotic plaque. No significant arch vessel origin stenosis. Partially visualized mild chronic aneurysmal dilatation of the ascending aorta, not progressive from the prior CTA as imaged today. Prominent atherosclerotic plaque in the left subclavian artery just proximal to the left vertebral artery origin resulting in moderate to severe stenosis, similar to the prior CTA. Right carotid system: Proximal common carotid artery is obscured by streak artifact from venous contrast. New stents are present in the more distal common carotid artery and proximal ICA, grossly patent though with assessment for residual/recurrent stenosis precluded by severe motion artifact. Left carotid system:  Patent with calcified plaque at the carotid bifurcation. Reassessment of known stenosis precluded  by severe motion artifact. Vertebral arteries: The vertebral arteries are patent and codominant. At most mild stenosis of the vertebral artery origins. Skeleton: Severe left-sided cervical facet arthrosis. Severe disc degeneration at C6-7. Other neck: No gross acute abnormality or mass. Upper chest: Grossly clear lung apices.  Prior CABG. Review of the MIP images confirms the above findings CTA HEAD FINDINGS Anterior circulation: Motion artifact precludes assessment of the majority of the intracranial ICAs. The distal supraclinoid ICAs and ICA termini are widely patent bilaterally. ACAs and MCAs are patent without evidence of proximal branch occlusion. The left A1 segment is hypoplastic. No aneurysm is identified. Posterior circulation: The intracranial vertebral arteries are patent with artifact versus severe stenosis of the right vertebrobasilar junction. No significant stenosis was present in this location previously which may favor artifact. The basilar artery is grossly patent with severe motion artifact through its midportion. The right PCA is patent without significant proximal stenosis. There is a chronic severe left P1-P2 junction stenosis. No aneurysm is identified. Venous sinuses: As permitted by contrast timing and motion artifact, patent. Anatomic variants: Hypoplastic left A1. Delayed phase: Not performed as the patient was agitated and trying to get off of the CT table. Review of the MIP images confirms the above findings IMPRESSION: 1. Severely motion degraded examination. 2. Patent posterior circulation with chronic severe proximal left PCA stenosis. 3. Artifact versus new severe stenosis at the right vertebrobasilar junction. 4. Grossly patent carotid arteries with interval right carotid stenting. Nondiagnostic assessment for stenosis or dissection. 5. Similar appearance of moderate to severe left subclavian artery stenosis just proximal to the vertebral origin. 6.  Aortic Atherosclerosis  (ICD10-I70.0). Electronically Signed   By: Logan Bores M.D.   On: 10/10/2018 16:25   Mr Brain Wo Contrast  Result Date: 10/10/2018 CLINICAL DATA:  85 y/o M; bilateral vision loss and right-sided weakness. EXAM: MRI HEAD WITHOUT CONTRAST TECHNIQUE: Axial DWI sequence was acquired. The patient was unable to continue and additional sequences were not acquired. COMPARISON:  10/09/2018 CT head.  04/15/2018 MRI head. FINDINGS: Severe motion degraded axial DWI sequence. Right occipital lobe reduced diffusion consistent with acute/early subacute infarction. Chronic left PCA distribution infarction. Suboptimal assessment for hemorrhage, small stroke, extra-axial collection, or subtle mass effect. IMPRESSION: Severe motion degraded DWI sequence. 1. Acute/early subacute infarct of right occipital lobe. Suboptimal assessment for hemorrhage due to motion artifact. 2. Chronic left PCA distribution infarct. These results will be called to the ordering clinician or representative by the Radiologist Assistant, and communication documented in the PACS or zVision Dashboard. Electronically Signed   By: Kristine Garbe M.D.   On: 10/10/2018 15:58       Assessment & Plan:   Problem List Items Addressed This Visit    Abnormal chest CT    Saw Dr Raul Del.  Evaluated 09/26/17.  Recommended f/u cxr in 6 months.  Had cxr 04/2018.  No active cardiopulmonary disease.        Ascending aortic aneurysm ([place])    Has been followed by vascular surgery.        CAD (coronary artery disease)    Followed by cardiology.  Stable.        CVA (cerebral vascular accident) [facility])    Recently admitted with CVA.  Vision is improving.  Motor movements improving.  Physical therapy to work with pt.  Discussed f/u with neurology.  They decline at this time.  Diabetes mellitus type 2 in nonobese [facility])    Follow met b and a1c.        Hemiparesis affecting dominant side as late effect of cerebrovascular accident [facility])     Improving some.  Planning to work with PT.        Hydroureteronephrosis    Being followed by urology.  Has stents in place.  Planning for f/u CT.        Hypercholesterolemia    On lipitor.  Follow lipid panel and liver function tests.        Hypertension    Blood pressure under good control.  Continue same medication regimen.  Follow pressures.  Follow metabolic panel.        PAF (paroxysmal atrial fibrillation) ([place])    Followed by cardiology.  On eliquis.         Other Visit Diagnoses    Dark urine    -  Primary   Family desires to have urine checked.     Relevant Orders   Urinalysis, Routine w reflex microscopic (Completed)   Urine Culture (Completed)       Einar Pheasant, MD

## 2018-10-18 ENCOUNTER — Other Ambulatory Visit: Payer: Self-pay | Admitting: Urology

## 2018-10-18 DIAGNOSIS — N200 Calculus of kidney: Secondary | ICD-10-CM

## 2018-10-18 LAB — URINE CULTURE
MICRO NUMBER:: 33752
Result:: NO GROWTH
SPECIMEN QUALITY:: ADEQUATE

## 2018-10-20 NOTE — Assessment & Plan Note (Signed)
Blood pressure under good control.  Continue same medication regimen.  Follow pressures.  Follow metabolic panel.   

## 2018-10-20 NOTE — Assessment & Plan Note (Signed)
On lipitor.  Follow lipid panel and liver function tests.   

## 2018-10-20 NOTE — Assessment & Plan Note (Signed)
Being followed by urology.  Has stents in place.  Planning for f/u CT.

## 2018-10-20 NOTE — Assessment & Plan Note (Signed)
Recently admitted with CVA.  Vision is improving.  Motor movements improving.  Physical therapy to work with pt.  Discussed f/u with neurology.  They decline at this time.

## 2018-10-20 NOTE — Assessment & Plan Note (Signed)
Follow met b and a1c.

## 2018-10-20 NOTE — Assessment & Plan Note (Signed)
Has been followed by vascular surgery.  

## 2018-10-20 NOTE — Assessment & Plan Note (Signed)
Followed by cardiology. Stable.   

## 2018-10-20 NOTE — Assessment & Plan Note (Signed)
Followed by cardiology.  On eliquis.   

## 2018-10-20 NOTE — Assessment & Plan Note (Signed)
Improving some.  Planning to work with PT.

## 2018-10-20 NOTE — Assessment & Plan Note (Signed)
Saw Dr Raul Del.  Evaluated 09/26/17.  Recommended f/u cxr in 6 months.  Had cxr 04/2018.  No active cardiopulmonary disease.

## 2018-10-21 ENCOUNTER — Ambulatory Visit
Admission: RE | Admit: 2018-10-21 | Discharge: 2018-10-21 | Disposition: A | Discharge status: Home / Self Care | Payer: PPO | Source: Ambulatory Visit | Attending: Urology | Admitting: Urology

## 2018-10-21 ENCOUNTER — Ambulatory Visit: Payer: PPO

## 2018-10-21 ENCOUNTER — Encounter: Payer: Self-pay | Admitting: Internal Medicine

## 2018-10-21 DIAGNOSIS — M25511 Pain in right shoulder: Secondary | ICD-10-CM

## 2018-10-21 DIAGNOSIS — N200 Calculus of kidney: Secondary | ICD-10-CM | POA: Insufficient documentation

## 2018-10-23 ENCOUNTER — Ambulatory Visit: Payer: PPO

## 2018-10-23 NOTE — Telephone Encounter (Signed)
Received notification from PT that pt is having R arm pain after CT scan. Called patients daughter. Confirmed patient was doing ok. Arm is not cold. Swelling is not all the way up arm. Mostly in hand. Using ice and tylenol as needed. Advised that I would send notification to Dr. Lucky Cowboy and let them know and also that pt should f/u with ortho for pain in shoulder.   Dr. Nicki Reaper this is just an FYI, we have already discussed.

## 2018-10-23 NOTE — Telephone Encounter (Signed)
I have placed the order for the f/u with Dr Roland Rack (ortho - for his shoulder).  It appears that Dr Lucky Cowboy was not the ordering physician for the last CT.  The last CT was done to assess urological issues.  If pain and swelling have improved, will have him f/u with ortho.

## 2018-10-24 ENCOUNTER — Ambulatory Visit: Payer: Self-pay | Admitting: Pharmacist

## 2018-10-25 ENCOUNTER — Telehealth: Payer: Self-pay | Admitting: Internal Medicine

## 2018-10-25 ENCOUNTER — Other Ambulatory Visit: Payer: Self-pay

## 2018-10-25 MED ORDER — QUETIAPINE FUMARATE 25 MG PO TABS: 25.0000 mg | ORAL_TABLET | Freq: Every day | ORAL | 0 refills | Status: DC

## 2018-10-25 NOTE — Telephone Encounter (Signed)
Patient is having more confusion and restlessness over the last week. Agitation has became worse. All episodes are between 5 pm through the middle of the night. Daughter says that she was told to call if worsened. The episodes were intermittent but now episodes are happening frequently and takes longer to get him calmed down. Daughter mentioned buspar or seroquel to take qhs?

## 2018-10-25 NOTE — Telephone Encounter (Signed)
Copied from [place] [id number]. Topic: General - Inquiry >> Oct 25, 2018  9:35 AM Margot Ables wrote: Reason for CRM: Jackelyn Poling is requesting call from Dr. Nicki Reaper regarding confusion and restlessness that happens in the evening and night. She states Dr. Nicki Reaper is aware and advised them to call if anything changed. Jackelyn Poling states pt behaviors are getting worse over the past week. Please advise.

## 2018-10-25 NOTE — Telephone Encounter (Signed)
They are agreeable. Seroquel sent in

## 2018-10-25 NOTE — Telephone Encounter (Signed)
If agreeable, would like to do a low dose of seroquel 25mg  q hs.

## 2018-10-25 NOTE — Telephone Encounter (Signed)
Pt daughter aware that f/u placed for Dr Roland Rack. Has been unable to ice as much due to pt agitation. Advised daughter to continue to monitor

## 2018-10-28 ENCOUNTER — Encounter (INDEPENDENT_AMBULATORY_CARE_PROVIDER_SITE_OTHER): Payer: Self-pay | Admitting: Vascular Surgery

## 2018-10-28 ENCOUNTER — Ambulatory Visit (INDEPENDENT_AMBULATORY_CARE_PROVIDER_SITE_OTHER): Payer: PPO | Admitting: Vascular Surgery

## 2018-10-28 ENCOUNTER — Ambulatory Visit (INDEPENDENT_AMBULATORY_CARE_PROVIDER_SITE_OTHER): Payer: PPO

## 2018-10-28 VITALS — BP 129/67 | HR 69 | Resp 16 | Ht 66.0 in | Wt 160.0 lb

## 2018-10-28 DIAGNOSIS — I63239 Cerebral infarction due to unspecified occlusion or stenosis of unspecified carotid arteries: Secondary | ICD-10-CM

## 2018-10-28 DIAGNOSIS — I2510 Atherosclerotic heart disease of native coronary artery without angina pectoris: Secondary | ICD-10-CM | POA: Diagnosis not present

## 2018-10-28 DIAGNOSIS — E119 Type 2 diabetes mellitus without complications: Secondary | ICD-10-CM | POA: Diagnosis not present

## 2018-10-28 DIAGNOSIS — I639 Cerebral infarction, unspecified: Secondary | ICD-10-CM

## 2018-10-28 DIAGNOSIS — Z7982 Long term (current) use of aspirin: Secondary | ICD-10-CM

## 2018-10-28 DIAGNOSIS — I10 Essential (primary) hypertension: Secondary | ICD-10-CM

## 2018-10-29 DIAGNOSIS — N201 Calculus of ureter: Secondary | ICD-10-CM | POA: Diagnosis not present

## 2018-10-30 ENCOUNTER — Encounter: Payer: Self-pay | Admitting: Internal Medicine

## 2018-10-30 DIAGNOSIS — I639 Cerebral infarction, unspecified: Secondary | ICD-10-CM

## 2018-10-30 DIAGNOSIS — I69322 Dysarthria following cerebral infarction: Secondary | ICD-10-CM

## 2018-10-30 DIAGNOSIS — I63532 Cerebral infarction due to unspecified occlusion or stenosis of left posterior cerebral artery: Secondary | ICD-10-CM

## 2018-10-31 DIAGNOSIS — I10 Essential (primary) hypertension: Secondary | ICD-10-CM | POA: Diagnosis not present

## 2018-10-31 DIAGNOSIS — Z951 Presence of aortocoronary bypass graft: Secondary | ICD-10-CM

## 2018-10-31 DIAGNOSIS — R319 Hematuria, unspecified: Secondary | ICD-10-CM | POA: Diagnosis not present

## 2018-10-31 DIAGNOSIS — M1990 Unspecified osteoarthritis, unspecified site: Secondary | ICD-10-CM

## 2018-10-31 DIAGNOSIS — I480 Paroxysmal atrial fibrillation: Secondary | ICD-10-CM

## 2018-10-31 DIAGNOSIS — G4700 Insomnia, unspecified: Secondary | ICD-10-CM | POA: Diagnosis not present

## 2018-10-31 DIAGNOSIS — E119 Type 2 diabetes mellitus without complications: Secondary | ICD-10-CM | POA: Diagnosis not present

## 2018-10-31 DIAGNOSIS — I252 Old myocardial infarction: Secondary | ICD-10-CM

## 2018-10-31 DIAGNOSIS — H9193 Unspecified hearing loss, bilateral: Secondary | ICD-10-CM

## 2018-10-31 DIAGNOSIS — M5136 Other intervertebral disc degeneration, lumbar region: Secondary | ICD-10-CM | POA: Diagnosis not present

## 2018-10-31 DIAGNOSIS — Z960 Presence of urogenital implants: Secondary | ICD-10-CM

## 2018-10-31 DIAGNOSIS — G4733 Obstructive sleep apnea (adult) (pediatric): Secondary | ICD-10-CM

## 2018-10-31 DIAGNOSIS — F0391 Unspecified dementia with behavioral disturbance: Secondary | ICD-10-CM | POA: Diagnosis not present

## 2018-10-31 DIAGNOSIS — Z7902 Long term (current) use of antithrombotics/antiplatelets: Secondary | ICD-10-CM

## 2018-10-31 DIAGNOSIS — I2510 Atherosclerotic heart disease of native coronary artery without angina pectoris: Secondary | ICD-10-CM | POA: Diagnosis not present

## 2018-10-31 DIAGNOSIS — E785 Hyperlipidemia, unspecified: Secondary | ICD-10-CM | POA: Diagnosis not present

## 2018-10-31 DIAGNOSIS — Z7901 Long term (current) use of anticoagulants: Secondary | ICD-10-CM

## 2018-10-31 DIAGNOSIS — H5460 Unqualified visual loss, one eye, unspecified: Secondary | ICD-10-CM | POA: Diagnosis not present

## 2018-10-31 DIAGNOSIS — F05 Delirium due to known physiological condition: Secondary | ICD-10-CM

## 2018-10-31 DIAGNOSIS — Z7982 Long term (current) use of aspirin: Secondary | ICD-10-CM

## 2018-10-31 DIAGNOSIS — H409 Unspecified glaucoma: Secondary | ICD-10-CM

## 2018-10-31 DIAGNOSIS — I69398 Other sequelae of cerebral infarction: Secondary | ICD-10-CM | POA: Diagnosis not present

## 2018-11-02 NOTE — Telephone Encounter (Signed)
Spoke to [facility] and order placed for neurology referral.

## 2018-11-03 ENCOUNTER — Encounter (INDEPENDENT_AMBULATORY_CARE_PROVIDER_SITE_OTHER): Payer: Self-pay | Admitting: Vascular Surgery

## 2018-11-03 NOTE — Progress Notes (Signed)
MRN : [id number]  Kyle Richardson is a 85 y.o. (11-25-1927) male who presents with chief complaint of  Chief Complaint  Patient presents with  . Follow-up    ultrasound  .  History of Present Illness: The patient is seen for follow up evaluation of carotid stenosis status post right carotid stent placement  on 05/22/2018.  There were no post operative problems or complications related to the surgery.  The patient denies neck or incisional pain.  The patient denies interval amaurosis fugax. There is no recent history of TIA symptoms or focal motor deficits in the right middle cerebral distribution. There has been an interval cerebellar stroke with persistent visual changes.  The patient denies headache.  The patient is taking enteric-coated aspirin 81 mg daily.  The patient has a history of coronary artery disease, no recent episodes of angina or shortness of breath. The patient denies PAD or claudication symptoms. There is a history of hyperlipidemia which is being treated with a statin.    Current Meds  Medication Sig  . apixaban (ELIQUIS) 5 MG TABS tablet Take 1 tablet (5 mg total) by mouth 2 (two) times daily.  Marland Kitchen aspirin EC 81 MG EC tablet Take 1 tablet (81 mg total) by mouth daily.  Marland Kitchen atorvastatin (LIPITOR) 40 MG tablet TAKE 1 TABLET BY MOUTH DAILY FOR CHOLESTEROL (Patient taking differently: Take 40 mg by mouth at bedtime. )  . divalproex (DEPAKOTE ER) 500 MG 24 hr tablet TAKE 1 TABLET BY MOUTH DAILY  . fluticasone (FLONASE) 50 MCG/ACT nasal spray SPRAY 2 SPRAYS IN EACH NOSTRIL ONCE A DAY (Patient taking differently: Place 1 spray into both nostrils at bedtime. )  . latanoprost (XALATAN) 0.005 % ophthalmic solution Place 1 drop into both eyes at bedtime.  Marland Kitchen loratadine (CLARITIN) 10 MG tablet Take 10 mg by mouth daily.  Marland Kitchen MELATONIN ER PO Take 1 tablet by mouth at bedtime.   . metoprolol succinate (TOPROL-XL) 25 MG 24 hr tablet Take 0.5 tablets (12.5 mg total) by mouth daily.  .  nitroGLYCERIN (NITROSTAT) 0.4 MG SL tablet Place 0.4 mg under the tongue every 5 (five) minutes as needed for chest pain.   Marland Kitchen QUEtiapine (SEROQUEL) 25 MG tablet Take 1 tablet (25 mg total) by mouth at bedtime.  . tizanidine (ZANAFLEX) 2 MG capsule 1-2 capsules q hs prn (Patient taking differently: Take 2 mg by mouth at bedtime. )    Past Medical History:  Diagnosis Date  . Abdominal fibromatosis   . 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  s/p PTCA of the mid RCA 1/96, also  s/p  CABG x 4 7/96  . Complication of anesthesia    confusion and hallucinations for several days after receiving Versed  . Degenerative disc disease   . Diabetes mellitus ([place])   . Glaucoma   . Hypercholesterolemia   . Hypertension   . Migraine headache    history of  . Myocardial infarction ([place]) 1996  . Osteoarthritis   . Sleep apnea   . Stroke [facility]) 02/22/2017   affected left side     Past Surgical History:  Procedure Laterality Date  . APPENDECTOMY    . CAROTID ANGIOGRAPHY Bilateral 04/19/2018   Procedure: CAROTID ANGIOGRAPHY;  Surgeon: Algernon Huxley, MD;  Location: [facility];  Service: Cardiovascular;  Laterality: Bilateral;  . CAROTID PTA/STENT INTERVENTION Right 05/22/2018   Procedure: CAROTID PTA/STENT INTERVENTION;  Surgeon: Katha Cabal, MD;  Location: [place] [facility];  Service: Cardiovascular;  Laterality: Right;  . CATARACT EXTRACTION W/ INTRAOCULAR LENS  IMPLANT, BILATERAL    . CHOLECYSTECTOMY     Removed  . CORONARY ARTERY BYPASS GRAFT  7/96   x4  . HEMORRHOID SURGERY    . HERNIA REPAIR    . TONSILLECTOMY    . UPP and tonsillectomy  1/86    Social History Social History   Tobacco Use  . Smoking status: Never Smoker  . Smokeless tobacco: Never Used  Substance Use Topics  . Alcohol use: No    Alcohol/week: 0.0 standard drinks  . Drug use: No    Family History Family History  Problem Relation Age of Onset  . Heart disease  Mother        died age 26  . Heart disease Father        and other vascular disease  . Diabetes Other        four siblings  . Heart disease Brother        s/p CABG  . Colon cancer Neg Hx   . Prostate cancer Neg Hx     Allergies  Allergen Reactions  . Fentanyl Other (See Comments)    Reaction: confusion  . Flomax [Tamsulosin Hcl] Other (See Comments)    Unknown  . Lamisil [Terbinafine] Other (See Comments)    Unknown  . Levofloxacin Other (See Comments)    Diplopia. NEVER put patient on levoquin!  . Lorazepam Other (See Comments)    Reaction: confusion/ hallucinations   . Metformin And Related Diarrhea  . Sertraline Other (See Comments)    Unknown  . Sulfa Antibiotics Other (See Comments)    Unknown  . Versed [Midazolam] Other (See Comments)    Hallucinations and confusion  . Clarithromycin Diarrhea  . Codeine Other (See Comments)    Unknown     REVIEW OF SYSTEMS (Negative unless checked)  Constitutional: [] Weight loss  [] Fever  [] Chills Cardiac: [] Chest pain   [] Chest pressure   [] Palpitations   [] Shortness of breath when laying flat   [] Shortness of breath with exertion. Vascular:  [] Pain in legs with walking   [] Pain in legs at rest  [] History of DVT   [] Phlebitis   [] Swelling in legs   [] Varicose veins   [] Non-healing ulcers Pulmonary:   [] Uses home oxygen   [] Productive cough   [] Hemoptysis   [] Wheeze  [] COPD   [] Asthma Neurologic:  [] Dizziness   [] Seizures   [x] History of stroke   [] History of TIA  [] Aphasia   [x] Vissual changes   [] Weakness or numbness in arm   [x] Weakness or numbness in leg Musculoskeletal:   [] Joint swelling   [] Joint pain   [] Low back pain Hematologic:  [] Easy bruising  [] Easy bleeding   [] Hypercoagulable state   [] Anemic Gastrointestinal:  [] Diarrhea   [] Vomiting  [] Gastroesophageal reflux/heartburn   [] Difficulty swallowing. Genitourinary:  [] Chronic kidney disease   [] Difficult urination  [] Frequent urination   [] Blood in urine Skin:   [] Rashes   [] Ulcers  Psychological:  [] History of anxiety   []  History of major depression.  Physical Examination  Vitals:   10/28/18 1453  BP: 129/67  Pulse: 69  Resp: 16  Weight: 160 lb (72.6 kg)  Height: 5\' 6"  (1.676 m)   Body mass index is 25.82 kg/m. Gen: WD/WN, NAD, frail seen in a wheelchair Head: [place]/AT, No temporalis wasting.  Ear/Nose/Throat: Hearing grossly intact, nares w/o erythema or drainage Eyes: PER, EOMI, sclera nonicteric.  Neck: Supple,  no large masses.   Pulmonary:  Good air movement, no audible wheezing bilaterally, no use of accessory muscles.  Cardiac: Ireg Ireg;, no JVD Vascular: bilateral carotid bruit Vessel Right Left  Radial Palpable Palpable  Brachial Palpable Palpable  Carotid Palpable Palpable  Gastrointestinal: Non-distended. No guarding/no peritoneal signs.  Musculoskeletal: M/S 5/5 throughout.  No deformity or atrophy.  Neurologic: CN 2-12 intact. Symmetrical.  Speech is fluent. Motor exam as listed above. Psychiatric: Judgment intact, Mood & affect appropriate for pt's clinical situation. Dermatologic: No rashes or ulcers noted.  No changes consistent with cellulitis. Lymph : No lichenification or skin changes of chronic lymphedema.  CBC Lab Results  Component Value Date   WBC 6.6 10/09/2018   HGB 14.4 10/11/2018   HCT 40.3 10/09/2018   MCV 96.9 10/09/2018   PLT 189 10/09/2018    BMET    Component Value Date/Time   NA 138 10/09/2018 2229   NA 140 03/23/2014 2024   K 3.6 10/09/2018 2229   K 3.8 03/23/2014 2024   CL 105 10/09/2018 2229   CL 104 03/23/2014 2024   CO2 26 10/09/2018 2229   CO2 27 03/23/2014 2024   GLUCOSE 187 (H) 10/09/2018 2229   GLUCOSE 156 04/16/2015 1220   BUN 16 10/09/2018 2229   BUN 13 03/23/2014 2024   CREATININE 0.93 10/09/2018 2229   CREATININE 0.97 03/23/2014 2024   CALCIUM 8.6 (L) 10/09/2018 2229   CALCIUM 8.7 03/23/2014 2024   GFRNONAA >60 10/09/2018 2229   GFRNONAA >60 03/23/2014 2024   GFRAA  >60 10/09/2018 2229   GFRAA >60 03/23/2014 2024   CrCl cannot be calculated (Patient's most recent lab result is older than the maximum 21 days allowed.).  COAG Lab Results  Component Value Date   INR 1.05 10/09/2018   INR 1.00 04/15/2018   INR 1.12 02/22/2018    Radiology Ct Angio Head W Or Wo Contrast  Result Date: 10/10/2018 CLINICAL DATA:  Acute vision loss. Acute right PCA infarct on limited MRI today. EXAM: CT ANGIOGRAPHY HEAD AND NECK TECHNIQUE: Multidetector CT imaging of the head and neck was performed using the standard protocol during bolus administration of intravenous contrast. Multiplanar CT image reconstructions and MIPs were obtained to evaluate the vascular anatomy. Carotid stenosis measurements (when applicable) are obtained utilizing NASCET criteria, using the distal internal carotid diameter as the denominator. CONTRAST:  73mL OMNIPAQUE IOHEXOL 350 MG/ML SOLN COMPARISON:  Neck CTA 04/16/2018.  Head MRA 04/15/2018. FINDINGS: The study suffers from intermittent, up to severe motion artifact due to patient agitation. CTA NECK FINDINGS Aortic arch: Standard 3 vessel aortic arch with moderate atherosclerotic plaque. No significant arch vessel origin stenosis. Partially visualized mild chronic aneurysmal dilatation of the ascending aorta, not progressive from the prior CTA as imaged today. Prominent atherosclerotic plaque in the left subclavian artery just proximal to the left vertebral artery origin resulting in moderate to severe stenosis, similar to the prior CTA. Right carotid system: Proximal common carotid artery is obscured by streak artifact from venous contrast. New stents are present in the more distal common carotid artery and proximal ICA, grossly patent though with assessment for residual/recurrent stenosis precluded by severe motion artifact. Left carotid system: Patent with calcified plaque at the carotid bifurcation. Reassessment of known stenosis precluded by severe  motion artifact. Vertebral arteries: The vertebral arteries are patent and codominant. At most mild stenosis of the vertebral artery origins. Skeleton: Severe left-sided cervical facet arthrosis. Severe disc degeneration at C6-7. Other neck: No gross acute  abnormality or mass. Upper chest: Grossly clear lung apices.  Prior CABG. Review of the MIP images confirms the above findings CTA HEAD FINDINGS Anterior circulation: Motion artifact precludes assessment of the majority of the intracranial ICAs. The distal supraclinoid ICAs and ICA termini are widely patent bilaterally. ACAs and MCAs are patent without evidence of proximal branch occlusion. The left A1 segment is hypoplastic. No aneurysm is identified. Posterior circulation: The intracranial vertebral arteries are patent with artifact versus severe stenosis of the right vertebrobasilar junction. No significant stenosis was present in this location previously which may favor artifact. The basilar artery is grossly patent with severe motion artifact through its midportion. The right PCA is patent without significant proximal stenosis. There is a chronic severe left P1-P2 junction stenosis. No aneurysm is identified. Venous sinuses: As permitted by contrast timing and motion artifact, patent. Anatomic variants: Hypoplastic left A1. Delayed phase: Not performed as the patient was agitated and trying to get off of the CT table. Review of the MIP images confirms the above findings IMPRESSION: 1. Severely motion degraded examination. 2. Patent posterior circulation with chronic severe proximal left PCA stenosis. 3. Artifact versus new severe stenosis at the right vertebrobasilar junction. 4. Grossly patent carotid arteries with interval right carotid stenting. Nondiagnostic assessment for stenosis or dissection. 5. Similar appearance of moderate to severe left subclavian artery stenosis just proximal to the vertebral origin. 6.  Aortic Atherosclerosis (ICD10-I70.0).  Electronically Signed   By: Logan Bores M.D.   On: 10/10/2018 16:25   Ct Head Wo Contrast  Result Date: 10/09/2018 CLINICAL DATA:  Vision loss EXAM: CT HEAD WITHOUT CONTRAST TECHNIQUE: Contiguous axial images were obtained from the base of the skull through the vertex without intravenous contrast. COMPARISON:  05/23/2018 FINDINGS: Brain: There is no mass, hemorrhage or extra-axial collection. Generalized atrophy with ex vacuo dilatation of the occipital horn of left lateral ventricle. Old left PCA territory infarct. There is periventricular hypoattenuation compatible with chronic microvascular disease. Vascular: Atherosclerotic calcification of the internal carotid arteries at the skull base. No abnormal hyperdensity of the major intracranial arteries or dural venous sinuses. Skull: The visualized skull base, calvarium and extracranial soft tissues are normal. Sinuses/Orbits: No fluid levels or advanced mucosal thickening of the visualized paranasal sinuses. No mastoid or middle ear effusion. The orbits are normal. IMPRESSION: 1. No acute intracranial abnormality. 2. 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 Electronically Signed   By: Ulyses Jarred M.D.   On: 10/09/2018 23:12   Ct Angio Neck W Or Wo Contrast  Result Date: 10/10/2018 CLINICAL DATA:  Acute vision loss. Acute right PCA infarct on limited MRI today. EXAM: CT ANGIOGRAPHY HEAD AND NECK TECHNIQUE: Multidetector CT imaging of the head and neck was performed using the standard protocol during bolus administration of intravenous contrast. Multiplanar CT image reconstructions and MIPs were obtained to evaluate the vascular anatomy. Carotid stenosis measurements (when applicable) are obtained utilizing NASCET criteria, using the distal internal carotid diameter as the denominator. CONTRAST:  41mL OMNIPAQUE IOHEXOL 350 MG/ML SOLN COMPARISON:  Neck CTA 04/16/2018.  Head MRA 04/15/2018. FINDINGS: The study suffers from  intermittent, up to severe motion artifact due to patient agitation. CTA NECK FINDINGS Aortic arch: Standard 3 vessel aortic arch with moderate atherosclerotic plaque. No significant arch vessel origin stenosis. Partially visualized mild chronic aneurysmal dilatation of the ascending aorta, not progressive from the prior CTA as imaged today. Prominent atherosclerotic plaque in the left subclavian artery just proximal to the left vertebral artery  origin resulting in moderate to severe stenosis, similar to the prior CTA. Right carotid system: Proximal common carotid artery is obscured by streak artifact from venous contrast. New stents are present in the more distal common carotid artery and proximal ICA, grossly patent though with assessment for residual/recurrent stenosis precluded by severe motion artifact. Left carotid system: Patent with calcified plaque at the carotid bifurcation. Reassessment of known stenosis precluded by severe motion artifact. Vertebral arteries: The vertebral arteries are patent and codominant. At most mild stenosis of the vertebral artery origins. Skeleton: Severe left-sided cervical facet arthrosis. Severe disc degeneration at C6-7. Other neck: No gross acute abnormality or mass. Upper chest: Grossly clear lung apices.  Prior CABG. Review of the MIP images confirms the above findings CTA HEAD FINDINGS Anterior circulation: Motion artifact precludes assessment of the majority of the intracranial ICAs. The distal supraclinoid ICAs and ICA termini are widely patent bilaterally. ACAs and MCAs are patent without evidence of proximal branch occlusion. The left A1 segment is hypoplastic. No aneurysm is identified. Posterior circulation: The intracranial vertebral arteries are patent with artifact versus severe stenosis of the right vertebrobasilar junction. No significant stenosis was present in this location previously which may favor artifact. The basilar artery is grossly patent with severe  motion artifact through its midportion. The right PCA is patent without significant proximal stenosis. There is a chronic severe left P1-P2 junction stenosis. No aneurysm is identified. Venous sinuses: As permitted by contrast timing and motion artifact, patent. Anatomic variants: Hypoplastic left A1. Delayed phase: Not performed as the patient was agitated and trying to get off of the CT table. Review of the MIP images confirms the above findings IMPRESSION: 1. Severely motion degraded examination. 2. Patent posterior circulation with chronic severe proximal left PCA stenosis. 3. Artifact versus new severe stenosis at the right vertebrobasilar junction. 4. Grossly patent carotid arteries with interval right carotid stenting. Nondiagnostic assessment for stenosis or dissection. 5. Similar appearance of moderate to severe left subclavian artery stenosis just proximal to the vertebral origin. 6.  Aortic Atherosclerosis (ICD10-I70.0). Electronically Signed   By: Logan Bores M.D.   On: 10/10/2018 16:25   Mr Brain Wo Contrast  Result Date: 10/10/2018 CLINICAL DATA:  85 y/o M; bilateral vision loss and right-sided weakness. EXAM: MRI HEAD WITHOUT CONTRAST TECHNIQUE: Axial DWI sequence was acquired. The patient was unable to continue and additional sequences were not acquired. COMPARISON:  10/09/2018 CT head.  04/15/2018 MRI head. FINDINGS: Severe motion degraded axial DWI sequence. Right occipital lobe reduced diffusion consistent with acute/early subacute infarction. Chronic left PCA distribution infarction. Suboptimal assessment for hemorrhage, small stroke, extra-axial collection, or subtle mass effect. IMPRESSION: Severe motion degraded DWI sequence. 1. Acute/early subacute infarct of right occipital lobe. Suboptimal assessment for hemorrhage due to motion artifact. 2. Chronic left PCA distribution infarct. These results will be called to the ordering clinician or representative by the Radiologist Assistant, and  communication documented in the PACS or zVision Dashboard. Electronically Signed   By: Kristine Garbe M.D.   On: 10/10/2018 15:58   Ct Renal Stone Study  Result Date: 10/21/2018 CLINICAL DATA:  Nephrolithiasis EXAM: CT ABDOMEN AND PELVIS WITHOUT CONTRAST TECHNIQUE: Multidetector CT imaging of the abdomen and pelvis was performed following the standard protocol without IV contrast. COMPARISON:  CT 02/15/2014 FINDINGS: Lower chest: Changes of CABG.  No acute abnormality. Hepatobiliary: No focal liver abnormality is seen. Status post cholecystectomy. No biliary dilatation. Pancreas: No focal abnormality or ductal dilatation. Spleen: No focal abnormality.  Normal size. Adrenals/Urinary Tract: Left ureteral stent in place. No hydronephrosis. No visible ureteral stones. Punctate nonobstructing 1 mm stone in the upper pole of the right kidney. Adrenal glands and urinary bladder unremarkable. Stomach/Bowel: Scattered sigmoid diverticula. No active diverticulitis. Stomach and small bowel decompressed, unremarkable. Vascular/Lymphatic: Heavily calcified aorta and iliac vessels. No evidence of aneurysm or adenopathy. Reproductive: No visible focal abnormality. Other: No free fluid or free air. Musculoskeletal: No acute bony abnormality. IMPRESSION: Left ureteral stent in place. There are no visible ureteral stones. No hydronephrosis. Punctate 1 mm right upper pole renal stone. No acute findings in the abdomen or pelvis. Heavily calcified aorta and iliac vessels.  No aneurysm. Electronically Signed   By: Rolm Baptise M.D.   On: 10/21/2018 11:00   Vas US Carotid  Result Date: 10/31/2018 Carotid Arterial Duplex Study Performing Technologist: Almira Coaster RVS  Examination Guidelines: A complete evaluation includes B-mode imaging, spectral Doppler, color Doppler, and power Doppler as needed of all accessible portions of each vessel. Bilateral testing is considered an integral part of a complete examination.  Limited examinations for reoccurring indications may be performed as noted.  Right Carotid Findings: +----------+--------+--------+--------+--------+--------+           PSV cm/sEDV cm/sStenosisDescribeComments +----------+--------+--------+--------+--------+--------+ CCA Prox  70      0                                +----------+--------+--------+--------+--------+--------+ CCA Mid   64      0                                +----------+--------+--------+--------+--------+--------+ CCA Distal93      0                                +----------+--------+--------+--------+--------+--------+ ICA Prox  84      10                               +----------+--------+--------+--------+--------+--------+ ICA Mid   104     8                                +----------+--------+--------+--------+--------+--------+ ICA Distal89      9                                +----------+--------+--------+--------+--------+--------+ ECA       185     0                                +----------+--------+--------+--------+--------+--------+ +----------+--------+-------+--------+-------------------+           PSV cm/sEDV cmsDescribeArm Pressure (mmHG) +----------+--------+-------+--------+-------------------+ Subclavian51      0                                  +----------+--------+-------+--------+-------------------+ +---------+--------+--+--------+-+ VertebralPSV cm/s25EDV cm/s4 +---------+--------+--+--------+-+  Right Stent(s): +--------------+--+-++---------++ Proximal Stent640triphasic +--------------+--+-++---------++ Distal Stent  930triphasic +--------------+--+-++---------++  Left Carotid Findings: +----------+-------+-------+--------+---------------------------------+--------+           PSV  EDV    StenosisDescribe                         Comments           cm/s   cm/s                                                      +----------+-------+-------+--------+---------------------------------+--------+ CCA Prox  69     0              heterogenous, irregular and                                               calcific                                  +----------+-------+-------+--------+---------------------------------+--------+ CCA Mid   84     0                                                        +----------+-------+-------+--------+---------------------------------+--------+ CCA Distal79                    heterogenous, irregular and                                               calcific                                  +----------+-------+-------+--------+---------------------------------+--------+ ICA Prox  112    7                                                        +----------+-------+-------+--------+---------------------------------+--------+ ICA Mid   78     0                                                        +----------+-------+-------+--------+---------------------------------+--------+ ICA Distal71     8                                                        +----------+-------+-------+--------+---------------------------------+--------+ ECA       146    0                                                        +----------+-------+-------+--------+---------------------------------+--------+  +----------+--------+--------+--------+-------------------+  SubclavianPSV cm/sEDV cm/sDescribeArm Pressure (mmHG) +----------+--------+--------+--------+-------------------+           153     0                                   +----------+--------+--------+--------+-------------------+ +---------+--------+--+--------+-+ VertebralPSV cm/s40EDV cm/s0 +---------+--------+--+--------+-+  Summary: Right Carotid: Velocities in the right ICA are consistent with a 1-39% stenosis.                Rt CCA and ICA stent appear to be widely patent. Left  Carotid: Velocities in the left ICA are consistent with a 1-39% stenosis.  *See table(s) above for measurements and observations.  Electronically signed by Hortencia Pilar MD on 10/31/2018 at 4:53:32 PM.    Final      Assessment/Plan 1. Carotid stenosis, symptomatic, with infarction [facility]) Recommend:  Given the patient's asymptomatic subcritical stenosis no further invasive testing or surgery at this time.  Duplex ultrasound shows <40% carotid stenosis bilaterally.  Recent CVA was likely secondary to A-fib given the occipital location.  Continue antiplatelet therapy as prescribed Continue management of CAD, HTN and Hyperlipidemia Healthy heart diet,  encouraged exercise at least 4 times per week Follow up in 6 months with duplex ultrasound and physical exam  - VAS US CAROTID; Future  2. Cerebrovascular accident (CVA), unspecified mechanism ([place]) Continue antiplatelet therapy and Eliquis for afib as described  3. Coronary artery disease involving native coronary artery of native heart without angina pectoris Continue cardiac and antihypertensive medications as already ordered and reviewed, no changes at this time.  Continue statin as ordered and reviewed, no changes at this time  Nitrates PRN for chest pain   4. Essential hypertension Continue antihypertensive medications as already ordered, these medications have been reviewed and there are no changes at this time.   5. Diabetes mellitus type 2 in nonobese [facility]) Continue hypoglycemic medications as already ordered, these medications have been reviewed and there are no changes at this time.  Hgb A1C to be monitored as already arranged by primary service     Hortencia Pilar, MD  11/03/2018 2:35 PM

## 2018-11-04 ENCOUNTER — Other Ambulatory Visit: Payer: Self-pay | Admitting: Internal Medicine

## 2018-11-04 ENCOUNTER — Other Ambulatory Visit: Payer: Self-pay | Admitting: Pharmacist

## 2018-11-04 NOTE — Patient Outreach (Signed)
[facility]) Care Management  11/04/2018  Kyle Richardson December 14, 1927 [id number]   Incoming call from Mrs. Floyce Stakes), on behalf of [name], in response to the [facility] Medication Adherence Campaign. Per chart [name] listed on patient's Designated Party release. HIPAA identifiers verified and verbal consent received.  Mrs. Mcmains reports that patient takes his atorvastatin 40 mg once daily as directed. Denies any missed doses or barriers to adherence. Reports that patient has been in the hospital multiple times over the past year, which has affected his medication refills. Mrs. Schreifels denies any medication questions/concerns or to speak further.  Will close pharmacy episode at this time.  Harlow Asa, PharmD, [place] [facility] [phone]

## 2018-11-05 ENCOUNTER — Telehealth: Payer: Self-pay | Admitting: *Deleted

## 2018-11-05 NOTE — Telephone Encounter (Signed)
Confirmed no one has advised patient of the need for ABX, patient dpr thought because she had a knee replacemtn and require ABX that some one whom has had a stroke would require the same.

## 2018-11-05 NOTE — Telephone Encounter (Signed)
Called and spoke with DPR and ask if patient has required prophylactic antibiotics in the past , DPR stated no , advised DPR there is no reason to require pretreatment with abx after stroke. FYI

## 2018-11-05 NOTE — Telephone Encounter (Signed)
rx ok'd for zanaflex #30 with one refill.   

## 2018-11-05 NOTE — Telephone Encounter (Signed)
Copied from [place] [phone]. Topic: General - Inquiry >> Nov 05, 2018 10:54 AM Scherrie Gerlach wrote: Reason for CRM: pt is going to dentist for a general cleaning at noon tomorrow, Wed 1/29. Wife wants to know if he needs prior abx to appt since he has had several strokes

## 2018-11-05 NOTE — Telephone Encounter (Signed)
Agree, no need for abx prophylaxis regarding stroke history.   If has never required abx previously, as long as no new issues (new ortho procedure, new heart issue) should not need.  Also confirm, no one has recently told them that he needs abx.

## 2018-11-12 DIAGNOSIS — I6930 Unspecified sequelae of cerebral infarction: Secondary | ICD-10-CM | POA: Diagnosis not present

## 2018-11-13 ENCOUNTER — Other Ambulatory Visit: Payer: Self-pay | Admitting: Internal Medicine

## 2018-11-15 NOTE — Telephone Encounter (Signed)
rx ok'd for seroquel #30 with one refill.

## 2018-11-18 ENCOUNTER — Other Ambulatory Visit: Payer: Self-pay | Admitting: Internal Medicine

## 2018-11-18 DIAGNOSIS — M7581 Other shoulder lesions, right shoulder: Secondary | ICD-10-CM | POA: Diagnosis not present

## 2018-11-19 ENCOUNTER — Other Ambulatory Visit: Payer: Self-pay | Admitting: Internal Medicine

## 2018-11-19 ENCOUNTER — Telehealth: Payer: Self-pay | Admitting: Internal Medicine

## 2018-11-19 NOTE — Telephone Encounter (Signed)
Called patients daughter. Unable to leave message

## 2018-11-19 NOTE — Telephone Encounter (Signed)
He is on a low dose of seroquel 25mg  q hs.  Will need to taper.   Take seroquel 25mg  qod x 1 week and then stop.  Can start zoloft 50mg  1/2 tablet x 1 week and then increase to one tablet per day.

## 2018-11-19 NOTE — Telephone Encounter (Signed)
Patient's daughter is returning a call from the nurse.  Please call her back as soon as possible.  CB# [phone]

## 2018-11-19 NOTE — Telephone Encounter (Signed)
Declined refill.  Pt tapering off.  Daughter reported intolerance.

## 2018-11-19 NOTE — Telephone Encounter (Signed)
Patients daughter is requesting to stop patients seroquel. She says that over the last week, he has started having increased tremors and has became significantly more anxious. He is having fear of falling, crying episodes, cursing, frantically grabbing for things, etc. Today he could not follow commands. He was started on seroquel on 10/25/18. Daughter thinks that this is coming from the seroquel and side effects. She is wanting something else for him that is more for anxiety. She does not want buspar. She says they are similar drugs. She says these symptoms have been present for awhile but has noticed it more this week. She says he is not having symptoms that have indicated another stroke.

## 2018-11-19 NOTE — Telephone Encounter (Signed)
Copied from [place] [id number]. Topic: General - Other >> Nov 19, 2018 10:13 AM Antonieta Iba C wrote: Reason for CRM: pt's spouse & daughter called in requesting a call back from [employer]. Pt is currently taking QUEtiapine (SEROQUEL) 25 MG tablet. Spouse /daughter share the concern that the medication is causing uncontrolled crying, anxiety, daughter says that pt seems to be panicky. Spouse would like to discuss changing pt's medication .   CB: [phone]    Please advise/assist.

## 2018-11-20 ENCOUNTER — Other Ambulatory Visit: Payer: Self-pay | Admitting: Internal Medicine

## 2018-11-20 NOTE — Telephone Encounter (Signed)
Patient has been on Zoloft before and caused severe headaches that patient cannot take Zoloft, patient did not sleep at all last night he talked all night from 6 pm to 6 am. Patient is "highly agitated , yelling , cursing ", " never has done this before". Patient is sleeping at this time, daughter and wife are really frustrated stating they can normally calm him. THe CNA's cannot handle him per daughter.

## 2018-11-20 NOTE — Telephone Encounter (Signed)
Called left message for patient daughter to return call to  Office, [facility] nurse may advise orders given PCP.

## 2018-11-20 NOTE — Telephone Encounter (Signed)
I would recommend starting depakote ER 500mg  q hs.  Will need depakote level and liver panel checked in one week - to see if dosing is correct.

## 2018-11-21 ENCOUNTER — Other Ambulatory Visit: Payer: Self-pay | Admitting: Internal Medicine

## 2018-11-21 DIAGNOSIS — Z5181 Encounter for therapeutic drug level monitoring: Secondary | ICD-10-CM

## 2018-11-21 NOTE — Telephone Encounter (Signed)
Patient is already taking depakote ER 500 mg q hs. Daughter called neurology to see if they had any suggestions and they recommened trazodone q hs. Daughter states that she thinks it is 50 mg q hs. Neurology has already sent this in. I scheduled pt for a lab appt. Do you want him to keep this appt to check his levels. I do not see where he has had one recently.

## 2018-11-21 NOTE — Progress Notes (Signed)
Order placed for f/u labs.  

## 2018-11-21 NOTE — Telephone Encounter (Signed)
Pt already scheduled for this

## 2018-11-21 NOTE — Telephone Encounter (Signed)
Yes would still like to get a depakote level and liver panel checked at within the next 1-2 weeks.

## 2018-11-28 DIAGNOSIS — I639 Cerebral infarction, unspecified: Secondary | ICD-10-CM | POA: Diagnosis not present

## 2018-11-29 ENCOUNTER — Other Ambulatory Visit: Payer: PPO

## 2018-12-02 ENCOUNTER — Other Ambulatory Visit: Payer: Self-pay | Admitting: Internal Medicine

## 2018-12-02 ENCOUNTER — Telehealth: Payer: Self-pay | Admitting: Internal Medicine

## 2018-12-02 NOTE — Telephone Encounter (Signed)
Pt was prescribed trazodone for sleep by neurology. Advised pts wife to call neurology and see if they will increase

## 2018-12-02 NOTE — Telephone Encounter (Signed)
Copied from [place] [phone]. Topic: Quick Communication - See Telephone Encounter >> Dec 02, 2018 10:36 AM Antonieta Iba C wrote: CRM for notification. See Telephone encounter for: 12/02/18.  Pt's spouse [name] is calling in to be advised by PCP. Pt/spouse says that Dr. Nicki Reaper prescribed a new sleeping medication that is not helping. Spouse says that pt was unable to sleep for 2 nights. Pt and spouse would like a call back to be advised.

## 2018-12-04 ENCOUNTER — Other Ambulatory Visit (INDEPENDENT_AMBULATORY_CARE_PROVIDER_SITE_OTHER): Payer: PPO

## 2018-12-04 DIAGNOSIS — Z5181 Encounter for therapeutic drug level monitoring: Secondary | ICD-10-CM | POA: Diagnosis not present

## 2018-12-04 NOTE — Addendum Note (Signed)
Addended by: Arby Barrette on: 12/04/2018 01:59 PM   Modules accepted: Orders

## 2018-12-05 ENCOUNTER — Telehealth: Payer: Self-pay

## 2018-12-05 LAB — HEPATIC FUNCTION PANEL
AG Ratio: 1.6 (calc) (ref 1.0–2.5)
ALT: 30 U/L (ref 9–46)
AST: 24 U/L (ref 10–35)
Albumin: 4.1 g/dL (ref 3.6–5.1)
Alkaline phosphatase (APISO): 64 U/L (ref 35–144)
Bilirubin, Direct: 0.1 mg/dL (ref 0.0–0.2)
Globulin: 2.5 g/dL (calc) (ref 1.9–3.7)
Indirect Bilirubin: 0.4 mg/dL (calc) (ref 0.2–1.2)
Total Bilirubin: 0.5 mg/dL (ref 0.2–1.2)
Total Protein: 6.6 g/dL (ref 6.1–8.1)

## 2018-12-05 LAB — VALPROIC ACID LEVEL: Valproic Acid Lvl: 27 mg/L — ABNORMAL LOW (ref 50.0–100.0)

## 2018-12-05 NOTE — Telephone Encounter (Signed)
Copied from [place] #[id number]. Topic: General - Other >> Dec 05, 2018 10:34 AM Antonieta Iba C wrote: Reason for CRM: pt's spouse is calling back in to speak with [place].  CB: [phone]

## 2018-12-06 ENCOUNTER — Telehealth: Payer: Self-pay

## 2018-12-06 ENCOUNTER — Other Ambulatory Visit: Payer: Self-pay | Admitting: Internal Medicine

## 2018-12-06 ENCOUNTER — Other Ambulatory Visit: Payer: Self-pay

## 2018-12-06 DIAGNOSIS — I10 Essential (primary) hypertension: Secondary | ICD-10-CM

## 2018-12-06 DIAGNOSIS — E119 Type 2 diabetes mellitus without complications: Secondary | ICD-10-CM

## 2018-12-06 DIAGNOSIS — E7800 Pure hypercholesterolemia, unspecified: Secondary | ICD-10-CM

## 2018-12-06 MED ORDER — DIVALPROEX SODIUM ER 250 MG PO TB24: 750.0000 mg | ORAL_TABLET | Freq: Every day | ORAL | 1 refills | Status: DC

## 2018-12-06 NOTE — Telephone Encounter (Signed)
Wife called back to inquire no one called her back yesterday. She states pt is not sleeping, up all night, and constantly talking.  Some of it makes sense, some of it does not.  She states she does not know what is going on with him, but the sleeping pill the dr prescribed is not working. She states something needs to be done before the weekend. Kyle Richardson states she is having a procedure done this am, and will be back home after 2 pm.  Please call after that. Home phone or her cell is [phone]

## 2018-12-06 NOTE — Telephone Encounter (Signed)
See result note. Increased depakote and monitoring to see if this helps with sleep and behavior. Advised wife to let me know.

## 2018-12-06 NOTE — Progress Notes (Signed)
Order placed for f/u labs.  

## 2018-12-06 NOTE — Telephone Encounter (Signed)
Spoke with wife. She was concerned with pt having UTI. Urine is clear, no odor. No acute symptoms. Pt is confused but has been having issues with behavior and sleeping. Increased depakote today. Advised pts wife to monitor and if any new symptoms, go to [facility]

## 2018-12-06 NOTE — Progress Notes (Signed)
Opened in error

## 2018-12-06 NOTE — Telephone Encounter (Signed)
Copied from [place] #[id number]. Topic: General - Other >> Dec 05, 2018 10:34 AM Antonieta Iba C wrote: Reason for CRM: pt's spouse is calling back in to speak with [place].  CB: [phone] >> Dec 06, 2018  3:43 PM Yvette Rack wrote: Pt wife called back stating she spoke with Larena Glassman moments ago and she would like to speak with her again. Pt wife requests call back.

## 2018-12-09 ENCOUNTER — Other Ambulatory Visit: Payer: Self-pay | Admitting: Internal Medicine

## 2018-12-09 NOTE — Telephone Encounter (Signed)
Copied from [place] [phone]. Topic: Quick Communication - Rx Refill/Question >> Dec 09, 2018 11:43 AM Reyne Dumas L wrote: Medication: latanoprost (XALATAN) 0.005 % ophthalmic solution  Has the patient contacted their pharmacy? Yes - out of medication.  Pt is trying to get in with new eye doctor at [facility] and pt's wife wants to know if Dr. Nicki Reaper can refill eye drops at this time until he gets established at [facility]. (Agent: If no, request that the patient contact the pharmacy for the refill.) (Agent: If yes, when and what did the pharmacy advise?)  Preferred Pharmacy (with phone number or street name): [place], [place] - [place] [phone] (Phone) [phone] (Fax)  Agent: Please be advised that RX refills may take up to 3 business days. We ask that you follow-up with your pharmacy.

## 2018-12-10 ENCOUNTER — Ambulatory Visit: Payer: Self-pay | Admitting: *Deleted

## 2018-12-10 NOTE — Telephone Encounter (Signed)
Requested medication (s) are due for refill today: ?  Requested medication (s) are on the active medication list: yes  Last refill:  ?  Future visit scheduled: yes  Notes to clinic:  Historical provider    Requested Prescriptions  Pending Prescriptions Disp Refills   latanoprost (XALATAN) 0.005 % ophthalmic solution 2.5 mL     Sig: Place 1 drop into both eyes at bedtime.     Ophthalmology:  Glaucoma Passed - 12/09/2018 12:45 PM      Passed - Valid encounter within last 12 months    Recent Outpatient Visits          1 month ago Dark urine   [facility] [place], [place], MD   3 months ago Coronary artery disease involving native coronary artery of native heart without angina pectoris   [facility] [place], [place], MD   5 months ago Right shoulder pain, unspecified chronicity   [facility] [place], [place] Patient, MD   5 months ago Swelling of arm   [facility] Jodelle Green, FNP   7 months ago Acute ischemic left PCA stroke [facility])   [facility] [place], [place] Einar Pheasant, MD      Future Appointments            In 1 month Einar Pheasant, MD [place], [place]

## 2018-12-10 NOTE — Telephone Encounter (Signed)
Message from [place] Faster sent at 12/10/2018 4:47 PM EST   Summary: cough cold    Pt wife called in and state that pt has had a cough and congestion for about 2 days now. She has been Museux 2 times a day and Delsym at night for cough. She would like to know what else she can do to prevent it getting worse. Pt has not yet been running a fever an no other symptoms. Please advise   [place] number [phone]          Returned call to p's wife regarding cold symptoms.  She denies fever. Cough wakes him up at night. It is productive at times but she has not been able to see what comes up. Denies runny nose.  Advised to continue what she is giving him along with taking a couple teaspoon of honey at bedtime, using a humidifier, drinking lots of water, drinking hot tea with honey and lemon. Always check with the pharmacist also regarding what other medications he can take for the cough. Advised to call back if symptoms becomes worse or increase.  Wife voiced understanding. Routing to flow at [facility] at [facility].  Reason for Disposition . Cough with cold symptoms (e.g., runny nose, postnasal drip, throat clearing)  Answer Assessment - Initial Assessment Questions 1. ONSET: "When did the cough begin?"      2 days ago 2. SEVERITY: "How bad is the cough today?"      Wakes up during the night with cough 3. RESPIRATORY DISTRESS: "Describe your breathing."      normal 4. FEVER: "Do you have a fever?" If so, ask: "What is your temperature, how was it measured, and when did it start?"     no 5. SPUTUM: "Describe the color of your sputum" (clear, white, yellow, green)     Unable to see 6. HEMOPTYSIS: "Are you coughing up any blood?" If so ask: "How much?" (flecks, streaks, tablespoons, etc.)     Unable to see 7. CARDIAC HISTORY: "Do you have any history of heart disease?" (e.g., heart attack, congestive heart failure)      Heart attack, has had by pass  surgery 8. LUNG HISTORY: "Do you have any history of lung disease?"  (e.g., pulmonary embolus, asthma, emphysema)     no 9. PE RISK FACTORS: "Do you have a history of blood clots?" (or: recent major surgery, recent prolonged travel, bedridden)     no 10. OTHER SYMPTOMS: "Do you have any other symptoms?" (e.g., runny nose, wheezing, chest pain)       no 11. PREGNANCY: "Is there any chance you are pregnant?" "When was your last menstrual period?"       n/a 12. TRAVEL: "Have you traveled out of the country in the last month?" (e.g., travel history, exposures)       no  Protocols used: [place]

## 2018-12-11 ENCOUNTER — Telehealth: Payer: Self-pay

## 2018-12-11 NOTE — Telephone Encounter (Signed)
LMTCB

## 2018-12-11 NOTE — Telephone Encounter (Signed)
Copied from [place] [id number]. Topic: General - Other >> Dec 11, 2018  8:13 AM Ivar Drape wrote: Reason for CRM:   Patient would like a return call from Dr. Bary Leriche medical assistant about his illness. >> Dec 11, 2018  3:32 PM Percell Belt A wrote: Pt wife called Larena Glassman back, Office number going to vm.  Pt wife stated she is home now and to call back anytime

## 2018-12-11 NOTE — Telephone Encounter (Signed)
If no fever or more acute symptoms, are you okay with them using OTC meds to see if it resolves or go ahead and advise evaluation?

## 2018-12-11 NOTE — Telephone Encounter (Signed)
See other note

## 2018-12-11 NOTE — Telephone Encounter (Signed)
Copied from [place] [id number]. Topic: General - Other >> Dec 11, 2018  8:13 AM Ivar Drape wrote: Reason for CRM:   Patient would like a return call from Dr. Bary Leriche medical assistant about his illness.

## 2018-12-11 NOTE — Telephone Encounter (Signed)
Agree with mucinex and delsym.  If drainage contributing to cough, would recommend steroid nasal spray (flonase or nasacort nasal spray - 2 sprays each nostril one time per day.  Do this in the evening).  If persistent symptoms, will need to be evaluated.

## 2018-12-12 NOTE — Telephone Encounter (Signed)
Patients wife is aware and said that it would be okay and keep [place] posted

## 2018-12-12 NOTE — Telephone Encounter (Signed)
Since we just increased his depakote, I would like to give this a chance to work.  Keep [place] posted.

## 2018-12-12 NOTE — Telephone Encounter (Signed)
Daughter stated that they have been doing all listed below with no improvement. He is not running a fever or new acute symptoms but daughter feels as if pt has became more congested in his chest. Advised that he needs to be seen. She did not want to take him to urgent care. Scheduled patient for appt with NP tomorrow. Advised that if he gets worse today or overnight he should not wait for appt and be seen sooner. Daughter agreed. There is another phone note on this patient as well.

## 2018-12-12 NOTE — Telephone Encounter (Signed)
Scheduled patient for tomorrow with Ander Purpura

## 2018-12-12 NOTE — Telephone Encounter (Signed)
This is a duplicate message regarding his congestion, I have documented and scheduled him to see the NP tomorrow. While I was on the phone with his daughter she mentioned that around 3:00 every afternoon patient becomes extremely agitated and anxious. We recently increased his depakote and she says that he is tolerating that well. He slept great the first two nights after increasing and then was up again the third night. He has been off of the seroquel for awhile. They reached out to neurology about increasing trazodone but they recommended primary care following this medication and adjusting as needed. The daughter is agreeable to increasing the trazodone but he takes this at bed time so she was also wondering if there was something that we could give for the agitation and anxiety in the afternoons.

## 2018-12-13 ENCOUNTER — Encounter: Payer: Self-pay | Admitting: Family Medicine

## 2018-12-13 ENCOUNTER — Telehealth: Payer: Self-pay | Admitting: Internal Medicine

## 2018-12-13 ENCOUNTER — Ambulatory Visit (INDEPENDENT_AMBULATORY_CARE_PROVIDER_SITE_OTHER): Payer: PPO | Admitting: Family Medicine

## 2018-12-13 VITALS — BP 120/60 | HR 66 | Temp 98.0°F

## 2018-12-13 DIAGNOSIS — R05 Cough: Secondary | ICD-10-CM

## 2018-12-13 DIAGNOSIS — J988 Other specified respiratory disorders: Secondary | ICD-10-CM | POA: Diagnosis not present

## 2018-12-13 DIAGNOSIS — R0981 Nasal congestion: Secondary | ICD-10-CM

## 2018-12-13 DIAGNOSIS — R0989 Other specified symptoms and signs involving the circulatory and respiratory systems: Secondary | ICD-10-CM | POA: Diagnosis not present

## 2018-12-13 DIAGNOSIS — R058 Other specified cough: Secondary | ICD-10-CM

## 2018-12-13 DIAGNOSIS — I63532 Cerebral infarction due to unspecified occlusion or stenosis of left posterior cerebral artery: Secondary | ICD-10-CM

## 2018-12-13 MED ORDER — DOXYCYCLINE HYCLATE 100 MG PO TABS: 100.0000 mg | ORAL_TABLET | Freq: Two times a day (BID) | ORAL | 0 refills | Status: DC

## 2018-12-13 MED ORDER — PREDNISONE 10 MG PO TABS: 10.0000 mg | ORAL_TABLET | Freq: Every day | ORAL | 0 refills | Status: AC

## 2018-12-13 MED ORDER — ALBUTEROL SULFATE HFA 108 (90 BASE) MCG/ACT IN AERS: 2.0000 | INHALATION_SPRAY | Freq: Four times a day (QID) | RESPIRATORY_TRACT | 0 refills | Status: DC | PRN

## 2018-12-13 MED ORDER — FLUTICASONE PROPIONATE 50 MCG/ACT NA SUSP: 2.0000 | Freq: Every day | NASAL | 2 refills | Status: AC

## 2018-12-13 NOTE — Progress Notes (Signed)
Subjective:    Patient ID: Kyle Richardson, male    DOB: 1928-08-10, 85 y.o.   MRN: [id number]  HPI   Patient presents to clinic complaining of cough, chest congestion, yellow phlegm for a little over a week.  Wife has similar symptoms.  Daughter has been trying to give him Mucinex, seems to have calm cough minimally however she can still hear a lot of wetness with his cough.  Daughter is concerned because after his stroke, he is not as able to spit out phlegm that he coughs up.  No fever or chills.  No nausea, vomiting or diarrhea.  Patient Active Problem List   Diagnosis Date Noted  . Stroke (cerebrum) ([place]) 10/11/2018  . Ureteral stone 08/25/2018  . Hydroureteronephrosis 08/05/2018  . Rotator cuff tendinitis, right 07/22/2018  . CVA, old, dysarthria 06/04/2018  . Hemiparesis affecting dominant side as late effect of cerebrovascular accident ([place]) 05/27/2018  . Encephalopathy   . Carotid stenosis, symptomatic, with infarction ([place]) 05/22/2018  . TIA (transient ischemic attack) 04/15/2018  . Episode of confusion 04/08/2018  . Labile blood pressure   . Acute ischemic left PCA stroke ([place]) 02/27/2018  . Diabetes mellitus type 2 in nonobese ([facility])   . Glaucoma   . MG, ocular (myasthenia gravis) ([place])   . CVA (cerebral vascular accident) ([place]) 02/24/2018  . Right homonymous hemianopsia 02/23/2018  . Unresponsive episode 02/22/2018  . UTI (urinary tract infection) 01/28/2018  . Abnormal chest CT 09/20/2017  . Double vision 01/21/2017  . Ascending aortic aneurysm ([place]) 12/22/2016  . Blood-tinged sputum 10/23/2016  . Cough 10/23/2016  . PAF (paroxysmal atrial fibrillation) ([place]) 09/14/2016  . Health care maintenance 12/26/2014  . Stress 07/05/2014  . Apnea, sleep 06/18/2014  . Migraine 05/10/2013  . Calculus of kidney 01/01/2013  . Hypertension 08/20/2012  . Hypercholesterolemia 08/20/2012  . CAD (coronary artery disease) 08/20/2012  . Benign localized hyperplasia of prostate  without urinary obstruction and other lower urinary tract symptoms (LUTS) 02/28/2012   Social History   Tobacco Use  . Smoking status: Never Smoker  . Smokeless tobacco: Never Used  Substance Use Topics  . Alcohol use: No    Alcohol/week: 0.0 standard drinks   Review of Systems   Constitutional: Negative for chills, fatigue and fever.  HENT: +nasal congestion. Eyes: Negative.   Respiratory: +cough, chest congestion, yellow phlegm, shortness of breath and wheezing.   Cardiovascular: Negative for chest pain, palpitations and leg swelling.  Gastrointestinal: Negative for abdominal pain, diarrhea, nausea and vomiting.  Genitourinary: Negative for dysuria, frequency and urgency.  Musculoskeletal: Negative for arthralgias and myalgias.  Skin: Negative for color change, pallor and rash.  Neurological: Negative for syncope, light-headedness and headaches.  Psychiatric/Behavioral: The patient is not nervous/anxious.       Objective:   Physical Exam Vitals signs and nursing note reviewed.  Constitutional:      General: He is not in acute distress.    Appearance: He is not toxic-appearing.  HENT:     Head: Normocephalic and atraumatic.     Right Ear: Tympanic membrane, ear canal and external ear normal.     Left Ear: Tympanic membrane and ear canal normal.     Nose: Congestion and rhinorrhea present.     Mouth/Throat:     Mouth: Mucous membranes are moist.     Pharynx: No oropharyngeal exudate or posterior oropharyngeal erythema.  Eyes:     General: No scleral icterus.    Extraocular Movements: Extraocular movements intact.  Pupils: Pupils are equal, round, and reactive to light.  Neck:     Musculoskeletal: Neck supple. No neck rigidity.  Cardiovascular:     Rate and Rhythm: Normal rate and regular rhythm.  Pulmonary:     Effort: Pulmonary effort is normal. No respiratory distress.     Breath sounds: Wheezing (faint upper resp) and rhonchi (scattered, +wet cough) present.    Lymphadenopathy:     Cervical: No cervical adenopathy.  Skin:    General: Skin is warm and dry.     Coloration: Skin is not jaundiced or pale.  Neurological:     Mental Status: He is alert. Mental status is at baseline.     Comments: In wheelchair, CVA history  Psychiatric:        Behavior: Behavior normal.     Vitals:   12/13/18 1103  BP: 120/60  Pulse: 66  Temp: 98 F (36.7 C)  SpO2: 97%      Assessment & Plan:    Respiratory infection, nasal congestion, chest congestion, cough productive of yellow sputum-due to patient's symptoms, and physical exam we will treat with doxycycline twice daily for 10 days to cover respiratory bacteria.  He will also take steroid burst to help reduce inflammation and use albuterol inhaler for any breakthrough wheezing or shortness of breath.  Offered chest x-ray in clinic, but daughter declines.  She will continue to use Mucinex to help thin secretions and calm cough symptoms.  They will monitor for any worsening of symptoms or breathing difficulty and call office right away and/or go to emergency room for evaluation if breathing worsens.  Patient will otherwise keep regularly scheduled follow-up PCP as planned.  He will return to clinic sooner if any issues arise.

## 2018-12-13 NOTE — Telephone Encounter (Signed)
Called pt  Unable to leave message

## 2018-12-13 NOTE — Telephone Encounter (Signed)
Please confirm with family they are ok with this. If so, then ok.

## 2018-12-13 NOTE — Telephone Encounter (Signed)
Santiago Glad from [facility] called stating that they are working with [facility] and that they have a new program called clear care where if a patient falls or needs any assistance in the home they reach out to [employer]. They called regarding Kyle Richardson. His family is requesting him to have PT for his balance/gait. Santiago Glad wants to know if you can enter an order for him and send it to her. Her personal fax is [phone]. Her ph# if you need it is [phone]

## 2018-12-16 ENCOUNTER — Telehealth: Payer: Self-pay

## 2018-12-16 NOTE — Telephone Encounter (Addendum)
Patient's wife asked if Dr. Nicki Reaper is not in can this message be forwarded to another provider for the answers to her questions.

## 2018-12-16 NOTE — Telephone Encounter (Signed)
Santiago Glad called back and stated that his wife wants [facility] to come out for PT since he has been with them before-asking for a [employer].  I'm not sure if they will accept him now because of his insurance but I will try.

## 2018-12-16 NOTE — Telephone Encounter (Signed)
Called and spoke with pt's wife she states that yes she is working with Santiago Glad from [employer] called stating that they are working with [facility] and that they have a new program called [facility] where if a patient falls or needs any assistance in the home they reach out to [employer]. They called regarding Kyle Richardson. His family is requesting him to have PT for his balance/gait. Santiago Glad wants to know if you can enter an order for him and send it to her. Her personal fax is [phone]. Her ph# if you need it is [phone].  Aslo pt's wife is stating they need a lift chair and she wanted you to help her to get that she did not know if it was a referral or not.  Wife also wanted to see if you could increae the pt's sleep medication dose, He is not sleeping at all, and its causing her not to get any sleep.  Please advise.  Gae Bon, CMA

## 2018-12-17 ENCOUNTER — Other Ambulatory Visit: Payer: Self-pay | Admitting: Internal Medicine

## 2018-12-17 DIAGNOSIS — Z8673 Personal history of transient ischemic attack (TIA), and cerebral infarction without residual deficits: Secondary | ICD-10-CM

## 2018-12-17 DIAGNOSIS — R2681 Unsteadiness on feet: Secondary | ICD-10-CM

## 2018-12-17 NOTE — Telephone Encounter (Signed)
Spoke with patient wife and she is willing to add melatonin 5 MG ok ? Also she would like the referral to  Dr. Nicolasa Ducking , [place] for me to DME if lift chair right?

## 2018-12-17 NOTE — Telephone Encounter (Addendum)
See previous message did not know if you were aware of wishes ? Also have printed the DME for lift chair family wanting to pick up today.

## 2018-12-17 NOTE — Telephone Encounter (Signed)
I have placed the order for physical therapy.  Regarding the sleeping medication, I reviewed Dr Claretha Cooper note.  She had started him on trazodone 50mg  q day.  We also recently increased his depakote to 750mg  q hs.  She also recommended him take melatonin with the trazodone.  Please have them add melatonin to his current regimen.  We will follow depakote level.  Also, given his persistent issues with sleep, I would like to schedule him to see Dr Nicolasa Ducking to help with medication management (given he is not responding to multiple medications).  If agreeable, let me know and I will place the order for the referral.

## 2018-12-17 NOTE — Progress Notes (Signed)
Order placed for PT referral.  

## 2018-12-17 NOTE — Progress Notes (Signed)
Called and informed pt's wife that order for PT referral has been placed.

## 2018-12-17 NOTE — Telephone Encounter (Signed)
Historical provider please advise to refill?

## 2018-12-17 NOTE — Progress Notes (Signed)
Pt has called again please call with updates [phone] Cell [phone]

## 2018-12-18 ENCOUNTER — Other Ambulatory Visit: Payer: Self-pay | Admitting: Internal Medicine

## 2018-12-18 DIAGNOSIS — G479 Sleep disorder, unspecified: Secondary | ICD-10-CM

## 2018-12-18 NOTE — Progress Notes (Signed)
Order placed for psych referral.   

## 2018-12-18 NOTE — Telephone Encounter (Signed)
[place] for lift chair.  Also, please let Melissa know that I am ok with family's wishes and if I need to do anything let me know.  I have already placed the order for Home Health.

## 2018-12-18 NOTE — Telephone Encounter (Signed)
Order placed for psych referral.  See other note regarding ok for lift chair.

## 2018-12-18 NOTE — Telephone Encounter (Signed)
Dr. Nicki Reaper is okay with family's wishes. She is out of office today but will return tomorrow. If there is anything that I can do, let me know! Thank you!

## 2018-12-18 NOTE — Telephone Encounter (Signed)
I do not prescribe his eye drops.  I assume ophthalmology has been refilling.  Needs to get refilled by provider that has been refilling.

## 2018-12-18 NOTE — Telephone Encounter (Signed)
Spoke with [facility]. Eye drops were filled by Dr. Edison Pace

## 2018-12-24 ENCOUNTER — Telehealth: Payer: Self-pay | Admitting: Internal Medicine

## 2018-12-24 NOTE — Telephone Encounter (Signed)
Copied from [facility] [place] [phone] [id number]. Topic: Quick Communication - See Telephone Encounter >> Dec 24, 2018  3:52 PM Rayann Heman wrote: CRM for notification. See Telephone encounter for: 12/24/18. Pt wife called and stated that she would like a call back from Dr Nicki Reaper nurse regarding patient getting anxious in the afternoon. Please advise

## 2018-12-27 DIAGNOSIS — I639 Cerebral infarction, unspecified: Secondary | ICD-10-CM | POA: Diagnosis not present

## 2018-12-30 NOTE — Telephone Encounter (Signed)
I have sent it to [facility]. Thanks [employer]

## 2018-12-30 NOTE — Telephone Encounter (Signed)
I spoke with you about this earlier. Just let me know who we send referral to so I can let patients wife know

## 2018-12-30 NOTE — Telephone Encounter (Signed)
Pt wife is calling  back

## 2018-12-31 ENCOUNTER — Other Ambulatory Visit: Payer: PPO

## 2019-01-02 ENCOUNTER — Telehealth: Payer: Self-pay

## 2019-01-02 ENCOUNTER — Other Ambulatory Visit: Payer: Self-pay

## 2019-01-02 ENCOUNTER — Other Ambulatory Visit (INDEPENDENT_AMBULATORY_CARE_PROVIDER_SITE_OTHER): Payer: PPO

## 2019-01-02 DIAGNOSIS — R300 Dysuria: Secondary | ICD-10-CM

## 2019-01-02 LAB — URINALYSIS, ROUTINE W REFLEX MICROSCOPIC
Bilirubin Urine: NEGATIVE
Hgb urine dipstick: NEGATIVE
Leukocytes,Ua: NEGATIVE
Nitrite: NEGATIVE
RBC / HPF: NONE SEEN (ref 0–?)
Specific Gravity, Urine: 1.02 (ref 1.000–1.030)
TOTAL PROTEIN, URINE-UPE24: NEGATIVE
Urine Glucose: NEGATIVE
Urobilinogen, UA: 0.2 (ref 0.0–1.0)
pH: 6 (ref 5.0–8.0)
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
## 2019-01-02 NOTE — Telephone Encounter (Signed)
Pt's wife called back w/ EMT present and wanted to know if urine sample can be collected to test for possible UTI.  Spoke w/ paramedic Gardner Candle who gave verbal report that pt's vital was stable: BP: 150/80; HR: 78.  Stated pt was no longer exhibiting agitated behavior.  Paramedic requested to collect urine sample at home w/ pt and have a family member bring to office since recommendation was to avoid bringing elderly pt's to ER given COVID virus outbreak.  Consulted w/ PCP's nurse.  Per LPN ok to receive urine sample from home as long as pt's vital is stable.  PCP's nurse will place orders for lab.

## 2019-01-02 NOTE — Telephone Encounter (Signed)
PEC transferred call from pt's [name].  Pt's wife stated that pt had been up all night urinating every hour.  Stated that urine had been clear until this morning urine was dark yellow.  This CMA heard pt yelling loudly in the background for someone to help him, so much so pt's wife had to go into another room to hear CMA.  Inquired to why pt was yelling and asked who was with pt.  Pt's wife informed that a caregiver from [facility] was in the room w/ pt.  Asked pt's wife if this behavior was normal, pt's wife said no.  Asked wife if she noticed pt had any other symptoms.  Pt's wife denied that pt had any other symptoms.  Advised wife to hang up and call 9-1-1 since pt's behavior was erratic.  Pt's wife refused and said that she didn't want to call.  Pt's wife said that pt's behavior was probably due to him not getting any sleep last night.  Asked pt's wife if I could speak with the caregiver.  Spoke w/ pt's caregiver and inquired about pt's behavior.  Caregiver said that pt's behavior was unusual.  Asked to speak w/ pt.  Caregiver said pt refused to take the phone.  Advised caregiver to call 9-1-1.  Caregiver agreed and stated that pt was trying to come out of his wheelchair and she could not handle him.  Advised caregiver to hangup and call 9-1-1.    Went and spoke w/ PCP's assigned nurse to inform what was going on with pt. Will forward notes to PCP.

## 2019-01-02 NOTE — Telephone Encounter (Signed)
Noted.  Notified lab.

## 2019-01-02 NOTE — Telephone Encounter (Signed)
Orders placed for urinalysis and culture.

## 2019-01-03 LAB — URINE CULTURE
MICRO NUMBER:: 355604
Result:: NO GROWTH
SPECIMEN QUALITY:: ADEQUATE

## 2019-01-10 ENCOUNTER — Other Ambulatory Visit: Payer: Self-pay | Admitting: Internal Medicine

## 2019-01-10 NOTE — Telephone Encounter (Signed)
Last OV 12/13/2018  Last refilled 11/05/2018 disp 30 with 1 refill   Next OV 01/31/2019  Sent to PCP for approval

## 2019-01-27 DIAGNOSIS — I639 Cerebral infarction, unspecified: Secondary | ICD-10-CM | POA: Diagnosis not present

## 2019-01-28 ENCOUNTER — Telehealth: Payer: Self-pay | Admitting: Internal Medicine

## 2019-01-28 NOTE — Telephone Encounter (Signed)
Spoke with Kyle Richardson regarding AWV. She stated she will have Mr. Bonzo give office a call back to schedule an appointment. SF

## 2019-01-29 ENCOUNTER — Other Ambulatory Visit: Payer: Self-pay | Admitting: Internal Medicine

## 2019-01-31 ENCOUNTER — Ambulatory Visit (INDEPENDENT_AMBULATORY_CARE_PROVIDER_SITE_OTHER): Payer: PPO | Admitting: Internal Medicine

## 2019-01-31 ENCOUNTER — Other Ambulatory Visit: Payer: Self-pay

## 2019-01-31 DIAGNOSIS — I2510 Atherosclerotic heart disease of native coronary artery without angina pectoris: Secondary | ICD-10-CM | POA: Diagnosis not present

## 2019-01-31 DIAGNOSIS — G7000 Myasthenia gravis without (acute) exacerbation: Secondary | ICD-10-CM

## 2019-01-31 DIAGNOSIS — I712 Thoracic aortic aneurysm, without rupture: Secondary | ICD-10-CM | POA: Diagnosis not present

## 2019-01-31 DIAGNOSIS — Z8673 Personal history of transient ischemic attack (TIA), and cerebral infarction without residual deficits: Secondary | ICD-10-CM | POA: Diagnosis not present

## 2019-01-31 DIAGNOSIS — R451 Restlessness and agitation: Secondary | ICD-10-CM | POA: Diagnosis not present

## 2019-01-31 DIAGNOSIS — H532 Diplopia: Secondary | ICD-10-CM

## 2019-01-31 DIAGNOSIS — I10 Essential (primary) hypertension: Secondary | ICD-10-CM

## 2019-01-31 DIAGNOSIS — E7800 Pure hypercholesterolemia, unspecified: Secondary | ICD-10-CM

## 2019-01-31 DIAGNOSIS — I480 Paroxysmal atrial fibrillation: Secondary | ICD-10-CM

## 2019-01-31 DIAGNOSIS — I7121 Aneurysm of the ascending aorta, without rupture: Secondary | ICD-10-CM

## 2019-01-31 DIAGNOSIS — E119 Type 2 diabetes mellitus without complications: Secondary | ICD-10-CM

## 2019-01-31 NOTE — Progress Notes (Addendum)
Patient ID: Kyle Richardson, male   DOB: 23-Dec-1927, 85 y.o.   MRN: [id number] Virtual Visit via video Note  This visit type was conducted due to national recommendations for restrictions regarding the COVID-19 pandemic (e.g. social distancing).  This format is felt to be most appropriate for this patient at this time.  All issues noted in this document were discussed and addressed.  No physical exam was performed (except for noted visual exam findings with Video Visits).   I connected with [name] on 4/24/20at 11:00 AM EDT by a video enabled telemedicine application and verified that I am speaking with the correct person using two identifiers. Location patient: home Location provider: work Persons participating in the virtual visit: patient, provider and [name]).   I discussed the limitations, risks, security and privacy concerns of performing an evaluation and management service by video and the availability of in person appointments. The patient and wife expressed understanding and agreed to proceed.  Interactive audio and video telecommunications were attempted between this provider and patient.  A portion of the appt was conducted with video and audio, but secondary to frequent pauses and to static, the remainder of the visit was continued and completed with audio only.     Reason for visit: follow up appt   HPI: Report he is doing relatively well.  Agitation is better.  Apparently seeing psychiatry through [facility].  ([place]).  Medication has been adjusted.  He is sleeping better.  Has been on hydroxyzine.  Only taking at night.  States noticed triple vision with medication.  Stopped.  Vision is better today.  Has history of strokes.  On antiplatelet therapy as outlined.  Has had extensive w/up.  Discussed need to f/u with ophthalmology regarding vision.  No chest pain.  No sob.  No acid reflux.  No abdominal pain.  Bowels moving.  No known COVID exposure.  No  fever.  No cough or congestion.  No sob.  Reports some "wobbling" right leg.  Has worked with therapy previously.  Is able to get around better.  Overall feels things are better.     ROS: See pertinent positives and negatives per HPI.  Past Medical History:  Diagnosis Date  . Abdominal fibromatosis   . 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  s/p PTCA of the mid RCA 1/96, also  s/p  CABG x 4 7/96  . Complication of anesthesia    confusion and hallucinations for several days after receiving Versed  . Degenerative disc disease   . Diabetes mellitus ([facility])   . Glaucoma   . Hypercholesterolemia   . Hypertension   . Migraine headache    history of  . Myocardial infarction ([place]) 1996  . Osteoarthritis   . Sleep apnea   . Stroke [facility]) 02/22/2017   affected left side     Past Surgical History:  Procedure Laterality Date  . APPENDECTOMY    . CAROTID ANGIOGRAPHY Bilateral 04/19/2018   Procedure: CAROTID ANGIOGRAPHY;  Surgeon: Algernon Huxley, MD;  Location: [facility];  Service: Cardiovascular;  Laterality: Bilateral;  . CAROTID PTA/STENT INTERVENTION Right 05/22/2018   Procedure: CAROTID PTA/STENT INTERVENTION;  Surgeon: Katha Cabal, MD;  Location: [facility];  Service: Cardiovascular;  Laterality: Right;  . CATARACT EXTRACTION W/ INTRAOCULAR LENS  IMPLANT, BILATERAL    . CHOLECYSTECTOMY     Removed  . CORONARY ARTERY BYPASS GRAFT  7/96  x4  . HEMORRHOID SURGERY    . HERNIA REPAIR    . TONSILLECTOMY    . UPP and tonsillectomy  1/86    Family History  Problem Relation Age of Onset  . Heart disease Mother        died age 41  . Heart disease Father        and other vascular disease  . Diabetes Other        four siblings  . Heart disease Brother        s/p CABG  . Colon cancer Neg Hx   . Prostate cancer Neg Hx     SOCIAL HX: reviewed.    Current Outpatient Medications:  .  albuterol (PROVENTIL HFA;VENTOLIN HFA)  108 (90 Base) MCG/ACT inhaler, Inhale 2 puffs into the lungs every 6 (six) hours as needed for wheezing or shortness of breath., Disp: 1 Inhaler, Rfl: 0 .  apixaban (ELIQUIS) 5 MG TABS tablet, Take 1 tablet (5 mg total) by mouth 2 (two) times daily., Disp: 30 tablet, Rfl: 1 .  aspirin EC 81 MG EC tablet, Take 1 tablet (81 mg total) by mouth daily., Disp: 180 tablet, Rfl: 2 .  atorvastatin (LIPITOR) 40 MG tablet, Take 1 tablet (40 mg total) by mouth at bedtime., Disp: 90 tablet, Rfl: 1 .  divalproex (DEPAKOTE ER) 250 MG 24 hr tablet, TAKE 3 TABLETS BY MOUTH AT BEDTIME., Disp: 90 tablet, Rfl: 1 .  fluticasone (FLONASE) 50 MCG/ACT nasal spray, Place 2 sprays into both nostrils daily., Disp: 16 g, Rfl: 2 .  latanoprost (XALATAN) 0.005 % ophthalmic solution, Place 1 drop into both eyes at bedtime., Disp: , Rfl:  .  loratadine (CLARITIN) 10 MG tablet, Take 10 mg by mouth daily., Disp: , Rfl:  .  MELATONIN ER PO, Take 1 tablet by mouth at bedtime. , Disp: , Rfl:  .  metoprolol succinate (TOPROL-XL) 25 MG 24 hr tablet, Take 0.5 tablets (12.5 mg total) by mouth daily., Disp: 30 tablet, Rfl: 1 .  nitroGLYCERIN (NITROSTAT) 0.4 MG SL tablet, Place 0.4 mg under the tongue every 5 (five) minutes as needed for chest pain. , Disp: , Rfl:  .  tiZANidine (ZANAFLEX) 2 MG tablet, TAKE ONE TABLET BY MOUTH AT BEDTIME, Disp: 30 tablet, Rfl: 1 .  traZODone (DESYREL) 50 MG tablet, , Disp: , Rfl:   EXAM:  GENERAL: alert, oriented, appears well and in no acute distress  HEENT: atraumatic, conjunttiva clear, no obvious abnormalities on inspection of external nose and ears  NECK: normal movements of the head and neck  LUNGS: on inspection no signs of respiratory distress, breathing rate appears normal, no obvious gross SOB, gasping or wheezing  CV: no obvious cyanosis  PSYCH/NEURO: pleasant and cooperative, no obvious depression or anxiety, speech and thought processing grossly intact  ASSESSMENT AND  PLAN:  Discussed the following assessment and plan:  History of CVA (cerebrovascular accident)  Ascending aortic aneurysm (HCC)  Coronary artery disease involving native coronary artery of native heart without angina pectoris  Diabetes mellitus type 2 in nonobese (HCC)  Double vision  Hypercholesterolemia  Essential hypertension  PAF (paroxysmal atrial fibrillation) (HCC)  MG, ocular (myasthenia gravis) (HCC)  Agitation  Ascending aortic aneurysm ([place]) Has been followed by vascular surgery.    CAD (coronary artery disease) Followed by cardiology.  Stable.  Continue risk factor modification.    History of CVA (cerebrovascular accident) Admitted previously with CVA.  Motor movements and speech do appear to have improved.  Continue antiplatelet therapy.  Continue f/u with neurology.    Diabetes mellitus type 2 in nonobese [facility]) Follow met b and a1c.    Double vision Vision change as outlined.  Discussed with wife today.  On eliquis.  Had MRI/MRA.  Saw neurology.  Referred to a neuromuscular specialist.  Worked up for myasthenia.  F/u with neurology.  Better.   Hypercholesterolemia On lipitor.  Low cholesterol diet and exercise.  Follow lipid panel and liver function tests.    Hypertension Blood pressure has been doing well.  Continue same medications.  Follow pressures.  Follow metabolic panel.   PAF (paroxysmal atrial fibrillation) ([place]) Followed by cardiology. On eliquis.    MG, ocular (myasthenia gravis) ([place]) Being worked up by neurology.    Agitation Seeing psychiatry - [facility]. Medication being adjusted.  Sleeping better.  Decreased agitation.  Follow.      I discussed the assessment and treatment plan with the patient. The patient was provided an opportunity to ask questions and all were answered. The patient agreed with the plan and demonstrated an understanding of the instructions.   The patient was advised to call back or seek an in-person evaluation  if the symptoms worsen or if the condition fails to improve as anticipated.  I provided 25 minutes of non-face-to-face time during this encounter.   Einar Pheasant, MD

## 2019-02-02 ENCOUNTER — Encounter: Payer: Self-pay | Admitting: Internal Medicine

## 2019-02-02 DIAGNOSIS — R451 Restlessness and agitation: Secondary | ICD-10-CM | POA: Insufficient documentation

## 2019-02-02 NOTE — Assessment & Plan Note (Signed)
Followed by cardiology.  Stable.  Continue risk factor modification.   

## 2019-02-02 NOTE — Assessment & Plan Note (Signed)
Follow met b and a1c.

## 2019-02-02 NOTE — Assessment & Plan Note (Signed)
Admitted previously with CVA.  Motor movements and speech do appear to have improved.  Continue antiplatelet therapy.  Continue f/u with neurology.

## 2019-02-02 NOTE — Assessment & Plan Note (Signed)
On lipitor.  Low cholesterol diet and exercise.  Follow lipid panel and liver function tests.   

## 2019-02-02 NOTE — Assessment & Plan Note (Signed)
Blood pressure has been doing well.  Continue same medications.  Follow pressures.  Follow metabolic panel.

## 2019-02-02 NOTE — Assessment & Plan Note (Signed)
Vision change as outlined.  Discussed with wife today.  On eliquis.  Had MRI/MRA.  Saw neurology.  Referred to a neuromuscular specialist.  Worked up for myasthenia.  F/u with neurology.  Better.

## 2019-02-02 NOTE — Assessment & Plan Note (Signed)
Seeing psychiatry - [facility]. Medication being adjusted.  Sleeping better.  Decreased agitation.  Follow.

## 2019-02-02 NOTE — Assessment & Plan Note (Signed)
Being worked up by neurology.

## 2019-02-02 NOTE — Assessment & Plan Note (Signed)
Followed by cardiology.  On eliquis.   

## 2019-02-02 NOTE — Assessment & Plan Note (Signed)
Has been followed by vascular surgery.  

## 2019-02-11 ENCOUNTER — Ambulatory Visit: Payer: PPO | Admitting: Psychiatry

## 2019-02-17 ENCOUNTER — Telehealth: Payer: Self-pay

## 2019-02-17 NOTE — Telephone Encounter (Signed)
Patient's wife called in stating that medication has not been sent in yet. Stated he only has 1 pill left and does not want to wait 3 days to get it.

## 2019-02-17 NOTE — Telephone Encounter (Signed)
Just need clarification.  I thought they did not feel the zanaflex was helping.  Behavioral health involved and adjusting medication and per visit, pt was sleeping better.  What does he need to zanaflex for?

## 2019-02-17 NOTE — Telephone Encounter (Signed)
Copied from [place] [id number]. Topic: General - Other >> Feb 14, 2019  3:34 PM Mcneil, Ja-Kwan wrote: Reason for CRM: Pt wife stated the Rx for tiZANidine (ZANAFLEX) 2 MG tablet was suppose to be increased to 4 MG but when she contacted the pharmacy she was told that they had not received the Rx. Pt wife  requests a call back. Cb# [phone]   Message sent to provider.  Amay Mijangos,cma

## 2019-02-18 MED ORDER — TIZANIDINE HCL 4 MG PO TABS: 4.0000 mg | ORAL_TABLET | Freq: Every day | ORAL | 1 refills | Status: DC | PRN

## 2019-02-18 NOTE — Telephone Encounter (Signed)
Patients wife is aware. He has done okay on the 4 mg.

## 2019-02-18 NOTE — Telephone Encounter (Signed)
Spoke with patients wife. In December we spoke with her about increasing his zanaflex to 4 mg qhs for muscle spasms in his right arm and leg. Ok was given at that time to increase prescription to 2 mg 1-2 tabs qhs. Wife then stated that she felt the increased dose did not help because he was staying so tense and agitated. He has only been taking 2 mg at bedtime but the muscle spasms are making It hard for him to be transferred. Wife is requesting that we send in 4mg  tabs so he only has to take 1 pill instead of doing 2mg  1-2 tabs qhs.

## 2019-02-18 NOTE — Telephone Encounter (Signed)
Reviewed note.  It appears he has been doing ok on 2mg  (two tablets).  Let me know if this is not correct.  I sent in rx for 4mg  tizanidine with refill.

## 2019-02-26 DIAGNOSIS — I639 Cerebral infarction, unspecified: Secondary | ICD-10-CM | POA: Diagnosis not present

## 2019-03-06 ENCOUNTER — Ambulatory Visit: Payer: Self-pay

## 2019-03-06 NOTE — Telephone Encounter (Signed)
Called and spoke to pt's wife.  Pt's wife said that pt is having one episode of diarrhea a day with no blood in stool.  Pt's wife will wait for a return call from PCP's nurse to schedule a work in appt for tomorrow.

## 2019-03-06 NOTE — Telephone Encounter (Signed)
How many episodes of diarrhea per day? Any blood.  I am ok to schedule a virtual visit.  Can work in tomorrow if needed.  See me about work in.

## 2019-03-06 NOTE — Telephone Encounter (Signed)
Patient's wife called and says the patient has been having diarrhea daily for the past 2 weeks. She says he was given Macrobid to take on May 5th and only took 1-2 capsules when the diarrhea started. She says they spoke to [facility] who advised him to take Imodium and he's been taking 1 pill/day and the diarrhea is still there. She says most times it's watery, other times after taking the imodium, it's some form to it. She says he's not vomiting and they are pushing fluids, he drinks a normal amount as per daughter. No fever. She says he had abdominal pain yesterday, but none today. I called the office and spoke to Butch Penny, [facility] who asks to speak to the patient's wife, the call was connected successfully.  Answer Assessment - Initial Assessment Questions 1. DIARRHEA SEVERITY: "How bad is the diarrhea?" "How many extra stools have you had in the past 24 hours than normal?"    - NO DIARRHEA (SCALE 0)   - MILD (SCALE 1-3): Few loose or mushy BMs; increase of 1-3 stools over normal daily number of stools; mild increase in ostomy output.   -  MODERATE (SCALE 4-7): Increase of 4-6 stools daily over normal; moderate increase in ostomy output. * SEVERE (SCALE 8-10; OR 'WORST POSSIBLE'): Increase of 7 or more stools daily over normal; moderate increase in ostomy output; incontinence.     Mild 2. ONSET: "When did the diarrhea begin?"      2 weeks ago 3. BM CONSISTENCY: "How loose or watery is the diarrhea?"      Watery most times 4. VOMITING: "Are you also vomiting?" If so, ask: "How many times in the past 24 hours?"      No 5. ABDOMINAL PAIN: "Are you having any abdominal pain?" If yes: "What does it feel like?" (e.g., crampy, dull, intermittent, constant)      Yes 6. ABDOMINAL PAIN SEVERITY: If present, ask: "How bad is the pain?"  (e.g., Scale 1-10; mild, moderate, or severe)   - MILD (1-3): doesn't interfere with normal activities, abdomen soft and not tender to touch    - MODERATE (4-7): interferes with  normal activities or awakens from sleep, tender to touch    - SEVERE (8-10): excruciating pain, doubled over, unable to do any normal activities       No pain now; abdominal pain yesterday 7. ORAL INTAKE: If vomiting, "Have you been able to drink liquids?" "How much fluids have you had in the past 24 hours?"     Trying to get it in him 8. HYDRATION: "Any signs of dehydration?" (e.g., dry mouth [not just dry lips], too weak to stand, dizziness, new weight loss) "When did you last urinate?"     No 9. EXPOSURE: "Have you traveled to a foreign country recently?" "Have you been exposed to anyone with diarrhea?" "Could you have eaten any food that was spoiled?"     No 10. ANTIBIOTIC USE: "Are you taking antibiotics now or have you taken antibiotics in the past 2 months?"     Macrobid, only took 1-2 capsules then diarrhea  11. OTHER SYMPTOMS: "Do you have any other symptoms?" (e.g., fever, blood in stool)      No 12. PREGNANCY: "Is there any chance you are pregnant?" "When was your last menstrual period?"      N/A  Protocols used: DIARRHEA-A-AH

## 2019-03-06 NOTE — Telephone Encounter (Signed)
See note about work in appt.

## 2019-03-06 NOTE — Telephone Encounter (Signed)
Pt scheduled  

## 2019-03-07 ENCOUNTER — Other Ambulatory Visit: Payer: Self-pay

## 2019-03-07 ENCOUNTER — Ambulatory Visit (INDEPENDENT_AMBULATORY_CARE_PROVIDER_SITE_OTHER): Payer: PPO | Admitting: Internal Medicine

## 2019-03-07 ENCOUNTER — Encounter: Payer: Self-pay | Admitting: Internal Medicine

## 2019-03-07 DIAGNOSIS — E119 Type 2 diabetes mellitus without complications: Secondary | ICD-10-CM

## 2019-03-07 DIAGNOSIS — R451 Restlessness and agitation: Secondary | ICD-10-CM

## 2019-03-07 DIAGNOSIS — E7800 Pure hypercholesterolemia, unspecified: Secondary | ICD-10-CM | POA: Diagnosis not present

## 2019-03-07 DIAGNOSIS — R197 Diarrhea, unspecified: Secondary | ICD-10-CM

## 2019-03-07 DIAGNOSIS — I2510 Atherosclerotic heart disease of native coronary artery without angina pectoris: Secondary | ICD-10-CM

## 2019-03-07 DIAGNOSIS — I480 Paroxysmal atrial fibrillation: Secondary | ICD-10-CM

## 2019-03-07 DIAGNOSIS — I10 Essential (primary) hypertension: Secondary | ICD-10-CM | POA: Diagnosis not present

## 2019-03-07 NOTE — Progress Notes (Signed)
Patient ID: Kyle Richardson, male   DOB: 05/09/1928, 85 y.o.   MRN: [id number]   Virtual Visit via video Note  This visit type was conducted due to national recommendations for restrictions regarding the COVID-19 pandemic (e.g. social distancing).  This format is felt to be most appropriate for this patient at this time.  All issues noted in this document were discussed and addressed.  No physical exam was performed (except for noted visual exam findings with Video Visits).   I connected with [name] by a video enabled telemedicine application and verified that I am speaking with the correct person using two identifiers. Location patient: home Location provider: work or home office Persons participating in the virtual visit: provider, [name] and [name].   I discussed the limitations, risks, security and privacy concerns of performing an evaluation and management service by video and the availability of in person appointments. The daughter and wife expressed understanding and agreed to proceed.   Reason for visit: acute visit.   HPI: Report that pt had some urinary symptoms at the beginning of March.  Was started on macrobid, but only took two doses.  Since the second dose of abx, he has had some issues with loose stools.  Has had some intermittent soft stool since had gallbladder removed, but now with intermittent loose stools, described as watery diarrhea.  Reports continues to have one episode per day now.  No blood in the stool.  [facility].  Has taken this intermittently, but does not seem to help.  No significant abdominal pain.  Eating.  No nausea or vomiting.  [facility] - adjusting his psych medications.  Took xanax last night.  Taking trazodone at night.  Agitation is better.     ROS: See pertinent positives and negatives per HPI.  Past Medical History:  Diagnosis Date  . Abdominal fibromatosis   . 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  s/p PTCA of the mid RCA 1/96, also  s/p  CABG x 4 7/96  . Complication of anesthesia    confusion and hallucinations for several days after receiving Versed  . Degenerative disc disease   . Diabetes mellitus ([place])   . Glaucoma   . Hypercholesterolemia   . Hypertension   . Migraine headache    history of  . Myocardial infarction ([place]) 1996  . Osteoarthritis   . Sleep apnea   . Stroke [facility]) 02/22/2017   affected left side     Past Surgical History:  Procedure Laterality Date  . APPENDECTOMY    . CAROTID ANGIOGRAPHY Bilateral 04/19/2018   Procedure: CAROTID ANGIOGRAPHY;  Surgeon: Algernon Huxley, MD;  Location: [facility];  Service: Cardiovascular;  Laterality: Bilateral;  . CAROTID PTA/STENT INTERVENTION Right 05/22/2018   Procedure: CAROTID PTA/STENT INTERVENTION;  Surgeon: Katha Cabal, MD;  Location: [facility];  Service: Cardiovascular;  Laterality: Right;  . CATARACT EXTRACTION W/ INTRAOCULAR LENS  IMPLANT, BILATERAL    . CHOLECYSTECTOMY     Removed  . CORONARY ARTERY BYPASS GRAFT  7/96   x4  . HEMORRHOID SURGERY    . HERNIA REPAIR    . TONSILLECTOMY    . UPP and tonsillectomy  1/86    Family History  Problem Relation Age of Onset  . Heart disease Mother        died age 40  . Heart disease Father  and other vascular disease  . Diabetes Other        four siblings  . Heart disease Brother        s/p CABG  . Colon cancer Neg Hx   . Prostate cancer Neg Hx     SOCIAL HX: reviewed.    Current Outpatient Medications:  .  albuterol (PROVENTIL HFA;VENTOLIN HFA) 108 (90 Base) MCG/ACT inhaler, Inhale 2 puffs into the lungs every 6 (six) hours as needed for wheezing or shortness of breath., Disp: 1 Inhaler, Rfl: 0 .  ALPRAZolam (XANAX) 0.25 MG tablet, Take 1 tablet by mouth 2 (two) times daily as needed., Disp: , Rfl:  .  apixaban (ELIQUIS) 5 MG TABS tablet, Take 1 tablet (5 mg total) by mouth 2 (two)  times daily., Disp: 30 tablet, Rfl: 1 .  aspirin EC 81 MG EC tablet, Take 1 tablet (81 mg total) by mouth daily., Disp: 180 tablet, Rfl: 2 .  atorvastatin (LIPITOR) 40 MG tablet, Take 1 tablet (40 mg total) by mouth at bedtime., Disp: 90 tablet, Rfl: 1 .  divalproex (DEPAKOTE ER) 250 MG 24 hr tablet, TAKE 3 TABLETS BY MOUTH AT BEDTIME., Disp: 90 tablet, Rfl: 1 .  fluticasone (FLONASE) 50 MCG/ACT nasal spray, Place 2 sprays into both nostrils daily., Disp: 16 g, Rfl: 2 .  latanoprost (XALATAN) 0.005 % ophthalmic solution, Place 1 drop into both eyes at bedtime., Disp: , Rfl:  .  loratadine (CLARITIN) 10 MG tablet, Take 10 mg by mouth daily., Disp: , Rfl:  .  MELATONIN ER PO, Take 1 tablet by mouth at bedtime. , Disp: , Rfl:  .  metoprolol succinate (TOPROL-XL) 25 MG 24 hr tablet, Take 0.5 tablets (12.5 mg total) by mouth daily., Disp: 30 tablet, Rfl: 1 .  nitroGLYCERIN (NITROSTAT) 0.4 MG SL tablet, Place 0.4 mg under the tongue every 5 (five) minutes as needed for chest pain. , Disp: , Rfl:  .  tiZANidine (ZANAFLEX) 4 MG tablet, Take 1 tablet (4 mg total) by mouth daily as needed for muscle spasms., Disp: 30 tablet, Rfl: 1 .  traZODone (DESYREL) 50 MG tablet, , Disp: , Rfl:   EXAM:  Not performed given this was a family conference.    ASSESSMENT AND PLAN:  Discussed the following assessment and plan:  Diarrhea, unspecified type - Plan: Gastrointestinal Pathogen Panel PCR  Agitation  Coronary artery disease involving native coronary artery of native heart without angina pectoris  Diabetes mellitus type 2 in nonobese (HCC)  Hypercholesterolemia  Essential hypertension  PAF (paroxysmal atrial fibrillation) ([place])  Agitation Seeing psychiatry - [facility].  Family reports he is doing better.  Follow.    CAD (coronary artery disease) Followed by cardiology.  Stable.   Diabetes mellitus type 2 in nonobese [facility]) Follow met b and a1c.    Hypercholesterolemia On lipitor.  Low  cholesterol diet and exercise.  Follow lipid panel and liver function tests.    Hypertension Blood pressure has been doing well.  Follow pressures.  Follow metabolic panel.   PAF (paroxysmal atrial fibrillation) ([place]) Followed by cardiology.  On eliquis.   Diarrhea Loose stool as outlined.  Discussed given persistence, the need for further evaluation.  Check stool studies.  Kaopectate.  Add probiotic.  Discussed fiber.  Follow closely.  If persistent, may need f/u GI evaluation.      I discussed the assessment and treatment plan with the patient. The patient was provided an opportunity to ask questions and all were answered. The patient  agreed with the plan and demonstrated an understanding of the instructions.   The patient was advised to call back or seek an in-person evaluation if the symptoms worsen or if the condition fails to improve as anticipated.  I provided 15 minutes of non-face-to-face time during this encounter.   Einar Pheasant, MD

## 2019-03-09 ENCOUNTER — Encounter: Payer: Self-pay | Admitting: Internal Medicine

## 2019-03-09 DIAGNOSIS — R197 Diarrhea, unspecified: Secondary | ICD-10-CM | POA: Insufficient documentation

## 2019-03-09 NOTE — Assessment & Plan Note (Signed)
Blood pressure has been doing well.  Follow pressures.  Follow metabolic panel.   

## 2019-03-09 NOTE — Assessment & Plan Note (Signed)
Follow met b and a1c.

## 2019-03-09 NOTE — Assessment & Plan Note (Signed)
Followed by cardiology. Stable.   

## 2019-03-09 NOTE — Assessment & Plan Note (Signed)
Followed by cardiology.  On eliquis.   

## 2019-03-09 NOTE — Assessment & Plan Note (Signed)
Seeing psychiatry - [facility].  Family reports he is doing better.  Follow.

## 2019-03-09 NOTE — Assessment & Plan Note (Signed)
Loose stool as outlined.  Discussed given persistence, the need for further evaluation.  Check stool studies.  Kaopectate.  Add probiotic.  Discussed fiber.  Follow closely.  If persistent, may need f/u GI evaluation.

## 2019-03-09 NOTE — Assessment & Plan Note (Signed)
On lipitor.  Low cholesterol diet and exercise.  Follow lipid panel and liver function tests.   

## 2019-03-12 ENCOUNTER — Other Ambulatory Visit: Payer: Self-pay

## 2019-03-12 ENCOUNTER — Other Ambulatory Visit (INDEPENDENT_AMBULATORY_CARE_PROVIDER_SITE_OTHER): Payer: PPO

## 2019-03-12 DIAGNOSIS — R197 Diarrhea, unspecified: Secondary | ICD-10-CM

## 2019-03-13 LAB — GASTROINTESTINAL PATHOGEN PANEL PCR
C. difficile Tox A/B, PCR: NOT DETECTED
Campylobacter, PCR: NOT DETECTED
Cryptosporidium, PCR: NOT DETECTED
E coli (ETEC) LT/ST PCR: NOT DETECTED
E coli (STEC) stx1/stx2, PCR: NOT DETECTED
E coli 0157, PCR: NOT DETECTED
Giardia lamblia, PCR: NOT DETECTED
Norovirus, PCR: NOT DETECTED
Rotavirus A, PCR: NOT DETECTED
Salmonella, PCR: NOT DETECTED
Shigella, PCR: NOT DETECTED

## 2019-03-28 ENCOUNTER — Emergency Department: Payer: PPO

## 2019-03-28 ENCOUNTER — Encounter: Payer: Self-pay | Admitting: Emergency Medicine

## 2019-03-28 ENCOUNTER — Inpatient Hospital Stay
Admission: EM | Admit: 2019-03-28 | Discharge: 2019-03-29 | DRG: 872 | Disposition: A | Discharge status: Home Health | Payer: PPO | Attending: Family Medicine | Admitting: Family Medicine

## 2019-03-28 ENCOUNTER — Inpatient Hospital Stay: Payer: PPO

## 2019-03-28 ENCOUNTER — Other Ambulatory Visit: Payer: Self-pay

## 2019-03-28 DIAGNOSIS — Z9049 Acquired absence of other specified parts of digestive tract: Secondary | ICD-10-CM

## 2019-03-28 DIAGNOSIS — I252 Old myocardial infarction: Secondary | ICD-10-CM | POA: Diagnosis not present

## 2019-03-28 DIAGNOSIS — R52 Pain, unspecified: Secondary | ICD-10-CM

## 2019-03-28 DIAGNOSIS — Z961 Presence of intraocular lens: Secondary | ICD-10-CM | POA: Diagnosis not present

## 2019-03-28 DIAGNOSIS — Z9841 Cataract extraction status, right eye: Secondary | ICD-10-CM | POA: Diagnosis not present

## 2019-03-28 DIAGNOSIS — R509 Fever, unspecified: Secondary | ICD-10-CM | POA: Diagnosis not present

## 2019-03-28 DIAGNOSIS — G4730 Sleep apnea, unspecified: Secondary | ICD-10-CM | POA: Diagnosis not present

## 2019-03-28 DIAGNOSIS — G9340 Encephalopathy, unspecified: Secondary | ICD-10-CM | POA: Diagnosis not present

## 2019-03-28 DIAGNOSIS — I2510 Atherosclerotic heart disease of native coronary artery without angina pectoris: Secondary | ICD-10-CM | POA: Diagnosis present

## 2019-03-28 DIAGNOSIS — R609 Edema, unspecified: Secondary | ICD-10-CM | POA: Diagnosis not present

## 2019-03-28 DIAGNOSIS — M1990 Unspecified osteoarthritis, unspecified site: Secondary | ICD-10-CM | POA: Diagnosis present

## 2019-03-28 DIAGNOSIS — Z8673 Personal history of transient ischemic attack (TIA), and cerebral infarction without residual deficits: Secondary | ICD-10-CM | POA: Diagnosis not present

## 2019-03-28 DIAGNOSIS — R0902 Hypoxemia: Secondary | ICD-10-CM | POA: Diagnosis not present

## 2019-03-28 DIAGNOSIS — E7800 Pure hypercholesterolemia, unspecified: Secondary | ICD-10-CM | POA: Diagnosis present

## 2019-03-28 DIAGNOSIS — Z9861 Coronary angioplasty status: Secondary | ICD-10-CM | POA: Diagnosis not present

## 2019-03-28 DIAGNOSIS — A419 Sepsis, unspecified organism: Secondary | ICD-10-CM | POA: Diagnosis not present

## 2019-03-28 DIAGNOSIS — I480 Paroxysmal atrial fibrillation: Secondary | ICD-10-CM | POA: Diagnosis not present

## 2019-03-28 DIAGNOSIS — I10 Essential (primary) hypertension: Secondary | ICD-10-CM | POA: Diagnosis present

## 2019-03-28 DIAGNOSIS — Z951 Presence of aortocoronary bypass graft: Secondary | ICD-10-CM

## 2019-03-28 DIAGNOSIS — Z7982 Long term (current) use of aspirin: Secondary | ICD-10-CM

## 2019-03-28 DIAGNOSIS — R4182 Altered mental status, unspecified: Secondary | ICD-10-CM

## 2019-03-28 DIAGNOSIS — J9811 Atelectasis: Secondary | ICD-10-CM | POA: Diagnosis not present

## 2019-03-28 DIAGNOSIS — Z8249 Family history of ischemic heart disease and other diseases of the circulatory system: Secondary | ICD-10-CM

## 2019-03-28 DIAGNOSIS — Z882 Allergy status to sulfonamides status: Secondary | ICD-10-CM

## 2019-03-28 DIAGNOSIS — N453 Epididymo-orchitis: Secondary | ICD-10-CM | POA: Diagnosis not present

## 2019-03-28 DIAGNOSIS — I639 Cerebral infarction, unspecified: Secondary | ICD-10-CM | POA: Diagnosis not present

## 2019-03-28 DIAGNOSIS — Z885 Allergy status to narcotic agent status: Secondary | ICD-10-CM | POA: Diagnosis not present

## 2019-03-28 DIAGNOSIS — E119 Type 2 diabetes mellitus without complications: Secondary | ICD-10-CM | POA: Diagnosis present

## 2019-03-28 DIAGNOSIS — Z1159 Encounter for screening for other viral diseases: Secondary | ICD-10-CM

## 2019-03-28 DIAGNOSIS — Z79899 Other long term (current) drug therapy: Secondary | ICD-10-CM

## 2019-03-28 DIAGNOSIS — Z9842 Cataract extraction status, left eye: Secondary | ICD-10-CM

## 2019-03-28 DIAGNOSIS — H409 Unspecified glaucoma: Secondary | ICD-10-CM | POA: Diagnosis present

## 2019-03-28 DIAGNOSIS — N433 Hydrocele, unspecified: Secondary | ICD-10-CM | POA: Diagnosis not present

## 2019-03-28 DIAGNOSIS — Z881 Allergy status to other antibiotic agents status: Secondary | ICD-10-CM

## 2019-03-28 DIAGNOSIS — N50819 Testicular pain, unspecified: Secondary | ICD-10-CM | POA: Diagnosis not present

## 2019-03-28 DIAGNOSIS — Z7901 Long term (current) use of anticoagulants: Secondary | ICD-10-CM | POA: Diagnosis not present

## 2019-03-28 DIAGNOSIS — Z888 Allergy status to other drugs, medicaments and biological substances status: Secondary | ICD-10-CM

## 2019-03-28 DIAGNOSIS — R404 Transient alteration of awareness: Secondary | ICD-10-CM | POA: Diagnosis not present

## 2019-03-28 DIAGNOSIS — R4020 Unspecified coma: Secondary | ICD-10-CM | POA: Diagnosis not present

## 2019-03-28 DIAGNOSIS — F0390 Unspecified dementia without behavioral disturbance: Secondary | ICD-10-CM | POA: Diagnosis not present

## 2019-03-28 DIAGNOSIS — R001 Bradycardia, unspecified: Secondary | ICD-10-CM | POA: Diagnosis not present

## 2019-03-28 DIAGNOSIS — Z833 Family history of diabetes mellitus: Secondary | ICD-10-CM

## 2019-03-28 DIAGNOSIS — Z20828 Contact with and (suspected) exposure to other viral communicable diseases: Secondary | ICD-10-CM | POA: Diagnosis not present

## 2019-03-28 HISTORY — DX: Unspecified dementia, unspecified severity, without behavioral disturbance, psychotic disturbance, mood disturbance, and anxiety: F03.90

## 2019-03-28 LAB — CBC WITH DIFFERENTIAL/PLATELET
Abs Immature Granulocytes: 0.03 10*3/uL (ref 0.00–0.07)
Basophils Absolute: 0 10*3/uL (ref 0.0–0.1)
Basophils Relative: 0 %
Eosinophils Absolute: 0.3 10*3/uL (ref 0.0–0.5)
Eosinophils Relative: 3 %
HCT: 43.4 % (ref 39.0–52.0)
Hemoglobin: 14.8 g/dL (ref 13.0–17.0)
Immature Granulocytes: 0 %
Lymphocytes Relative: 18 %
Lymphs Abs: 1.6 10*3/uL (ref 0.7–4.0)
MCH: 32.7 pg (ref 26.0–34.0)
MCHC: 34.1 g/dL (ref 30.0–36.0)
MCV: 95.8 fL (ref 80.0–100.0)
Monocytes Absolute: 0.7 10*3/uL (ref 0.1–1.0)
Monocytes Relative: 8 %
Neutro Abs: 6.3 10*3/uL (ref 1.7–7.7)
Neutrophils Relative %: 71 %
Platelets: 183 10*3/uL (ref 150–400)
RBC: 4.53 MIL/uL (ref 4.22–5.81)
RDW: 12.5 % (ref 11.5–15.5)
WBC: 8.8 10*3/uL (ref 4.0–10.5)
nRBC: 0 % (ref 0.0–0.2)

## 2019-03-28 LAB — URINALYSIS, ROUTINE W REFLEX MICROSCOPIC
Bilirubin Urine: NEGATIVE
Glucose, UA: 50 mg/dL — AB
Hgb urine dipstick: NEGATIVE
Ketones, ur: 5 mg/dL — AB
Leukocytes,Ua: NEGATIVE
Nitrite: NEGATIVE
Protein, ur: NEGATIVE mg/dL
Specific Gravity, Urine: 1.014 (ref 1.005–1.030)
pH: 8 (ref 5.0–8.0)

## 2019-03-28 LAB — COMPREHENSIVE METABOLIC PANEL
ALT: 27 U/L (ref 0–44)
AST: 26 U/L (ref 15–41)
Albumin: 3.6 g/dL (ref 3.5–5.0)
Alkaline Phosphatase: 61 U/L (ref 38–126)
Anion gap: 10 (ref 5–15)
BUN: 10 mg/dL (ref 8–23)
CO2: 24 mmol/L (ref 22–32)
Calcium: 8.6 mg/dL — ABNORMAL LOW (ref 8.9–10.3)
Chloride: 102 mmol/L (ref 98–111)
Creatinine, Ser: 0.85 mg/dL (ref 0.61–1.24)
GFR calc Af Amer: >60 mL/min (ref >60)
GFR calc non Af Amer: >60 mL/min (ref >60)
Glucose, Bld: 170 mg/dL — ABNORMAL HIGH (ref 70–99)
Potassium: 4.5 mmol/L (ref 3.5–5.1)
Sodium: 136 mmol/L (ref 135–145)
Total Bilirubin: 1.2 mg/dL (ref 0.3–1.2)
Total Protein: 6.3 g/dL — ABNORMAL LOW (ref 6.5–8.1)

## 2019-03-28 LAB — SARS CORONAVIRUS 2 BY RT PCR (HOSPITAL ORDER, PERFORMED IN ~~LOC~~ HOSPITAL LAB): SARS Coronavirus 2: NEGATIVE

## 2019-03-28 LAB — TROPONIN I: Troponin I: <0.03 ng/mL (ref <0.03)

## 2019-03-28 LAB — LACTIC ACID, PLASMA
Lactic Acid, Venous: 2.5 mmol/L (ref 0.5–1.9)
Lactic Acid, Venous: 2.2 mmol/L (ref 0.5–1.9)

## 2019-03-28 LAB — PROCALCITONIN: Procalcitonin: <0.1 ng/mL

## 2019-03-28 LAB — TSH: TSH: 1.37 u[IU]/mL (ref 0.350–4.500)

## 2019-03-28 MED ORDER — DIVALPROEX SODIUM ER 500 MG PO TB24
750.0000 mg | ORAL_TABLET | Freq: Every day | ORAL | Status: DC
  Administered 2019-03-28: 750 mg via ORAL
  Filled 2019-03-28 (×2): qty 1

## 2019-03-28 MED ORDER — SODIUM CHLORIDE 0.9 % IV BOLUS
1000.0000 mL | Freq: Once | INTRAVENOUS | Status: AC
  Administered 2019-03-28: 1000 mL via INTRAVENOUS

## 2019-03-28 MED ORDER — SODIUM CHLORIDE 0.9 % IV SOLN
1.0000 g | INTRAVENOUS | Status: DC
  Administered 2019-03-28: 1 g via INTRAVENOUS
  Filled 2019-03-28: qty 10

## 2019-03-28 MED ORDER — ONDANSETRON HCL 4 MG PO TABS: 4.0000 mg | ORAL_TABLET | Freq: Four times a day (QID) | ORAL | Status: DC | PRN

## 2019-03-28 MED ORDER — SODIUM CHLORIDE 0.9 % IV SOLN
2.0000 g | Freq: Once | INTRAVENOUS | Status: AC
  Administered 2019-03-28: 2 g via INTRAVENOUS
  Filled 2019-03-28: qty 2

## 2019-03-28 MED ORDER — VANCOMYCIN HCL 10 G IV SOLR
1250.0000 mg | INTRAVENOUS | Status: DC
  Filled 2019-03-28: qty 1250

## 2019-03-28 MED ORDER — VANCOMYCIN HCL IN DEXTROSE 1-5 GM/200ML-% IV SOLN: 1000.0000 mg | Freq: Once | INTRAVENOUS | Status: DC

## 2019-03-28 MED ORDER — HALOPERIDOL LACTATE 5 MG/ML IJ SOLN
INTRAMUSCULAR | Status: AC
  Filled 2019-03-28: qty 1

## 2019-03-28 MED ORDER — VANCOMYCIN HCL 10 G IV SOLR
2000.0000 mg | Freq: Once | INTRAVENOUS | Status: AC
  Administered 2019-03-28: 2000 mg via INTRAVENOUS
  Filled 2019-03-28: qty 2000

## 2019-03-28 MED ORDER — SODIUM CHLORIDE 0.9 % IV SOLN
2.0000 g | Freq: Three times a day (TID) | INTRAVENOUS | Status: DC
  Filled 2019-03-28 (×3): qty 2

## 2019-03-28 MED ORDER — SODIUM CHLORIDE 0.9 % IV SOLN
INTRAVENOUS | Status: DC
  Administered 2019-03-28: 18:00:00 via INTRAVENOUS

## 2019-03-28 MED ORDER — ATORVASTATIN CALCIUM 20 MG PO TABS
40.0000 mg | ORAL_TABLET | Freq: Every day | ORAL | Status: DC
  Administered 2019-03-28: 40 mg via ORAL
  Filled 2019-03-28: qty 2

## 2019-03-28 MED ORDER — HALOPERIDOL LACTATE 5 MG/ML IJ SOLN: 2.0000 mg | Freq: Four times a day (QID) | INTRAMUSCULAR | Status: DC | PRN

## 2019-03-28 MED ORDER — TRAZODONE HCL 50 MG PO TABS
50.0000 mg | ORAL_TABLET | Freq: Every day | ORAL | Status: DC
  Administered 2019-03-28: 50 mg via ORAL
  Filled 2019-03-28: qty 1

## 2019-03-28 MED ORDER — HALOPERIDOL LACTATE 5 MG/ML IJ SOLN
5.0000 mg | Freq: Once | INTRAMUSCULAR | Status: AC
  Administered 2019-03-28: 5 mg via INTRAVENOUS

## 2019-03-28 MED ORDER — ONDANSETRON HCL 4 MG/2ML IJ SOLN: 4.0000 mg | Freq: Four times a day (QID) | INTRAMUSCULAR | Status: DC | PRN

## 2019-03-28 MED ORDER — MORPHINE SULFATE (PF) 2 MG/ML IV SOLN
2.0000 mg | INTRAVENOUS | Status: DC | PRN
  Administered 2019-03-28: 19:00:00 2 mg via INTRAVENOUS
  Filled 2019-03-28: qty 1

## 2019-03-28 MED ORDER — SODIUM CHLORIDE 0.9 % IV SOLN
1.0000 g | INTRAVENOUS | Status: DC
  Filled 2019-03-28: qty 10

## 2019-03-28 MED ORDER — APIXABAN 5 MG PO TABS
5.0000 mg | ORAL_TABLET | Freq: Two times a day (BID) | ORAL | Status: DC
  Administered 2019-03-28 – 2019-03-29 (×2): 5 mg via ORAL
  Filled 2019-03-28 (×2): qty 1

## 2019-03-28 MED ORDER — METRONIDAZOLE IN NACL 5-0.79 MG/ML-% IV SOLN
500.0000 mg | Freq: Once | INTRAVENOUS | Status: AC
  Administered 2019-03-28: 500 mg via INTRAVENOUS
  Filled 2019-03-28: qty 100

## 2019-03-28 MED ORDER — LATANOPROST 0.005 % OP SOLN
1.0000 [drp] | Freq: Every day | OPHTHALMIC | Status: DC
  Administered 2019-03-28: 22:00:00 1 [drp] via OPHTHALMIC
  Filled 2019-03-28: qty 2.5

## 2019-03-28 NOTE — ED Notes (Signed)
Per daughter pt is usually very responsive in am and later gets restless and agitated in the evening. Pt does sundown. Daughter reports pt can transfer and stand at time. Pt has 24 hour care.

## 2019-03-28 NOTE — ED Provider Notes (Signed)
[name] Provider Note  Time seen: 11:27 AM  I have reviewed the triage vital signs and the nursing notes.   HISTORY  Chief Complaint Testicle Pain   HPI Kyle Richardson is a 85 y.o. male with a past medical history of hypertension, MI, CVA, dementia, presents to the emergency department from home for reported testicular pain.  According to EMS report patient lives at home with his wife and has around-the-clock sitters.  Has a history of dementia.  Per report patient was complaining of testicular pain overnight, felt warm this morning.  Patient found to be febrile to 100.5 in the emergency department.  Patient is confused, cannot answer questions accurately cannot contribute to his history or review of systems.   Past Medical History:  Diagnosis Date  . Abdominal fibromatosis   . 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  s/p PTCA of the mid RCA 1/96, also  s/p  CABG x 4 7/96  . Complication of anesthesia    confusion and hallucinations for several days after receiving Versed  . Degenerative disc disease   . Diabetes mellitus ([place])   . Glaucoma   . Hypercholesterolemia   . Hypertension   . Migraine headache    history of  . Myocardial infarction ([place]) 1996  . Osteoarthritis   . Sleep apnea   . Stroke [facility]) 02/22/2017   affected left side     Patient Active Problem List   Diagnosis Date Noted  . Diarrhea 03/09/2019  . Agitation 02/02/2019  . Stroke (cerebrum) ([place]) 10/11/2018  . Ureteral stone 08/25/2018  . Hydroureteronephrosis 08/05/2018  . Rotator cuff tendinitis, right 07/22/2018  . CVA, old, dysarthria 06/04/2018  . Hemiparesis affecting dominant side as late effect of cerebrovascular accident ([place]) 05/27/2018  . Encephalopathy   . Carotid stenosis, symptomatic, with infarction ([place]) 05/22/2018  . TIA (transient ischemic attack) 04/15/2018  . Episode of confusion 04/08/2018  . Labile  blood pressure   . Acute ischemic left PCA stroke ([place]) 02/27/2018  . Diabetes mellitus type 2 in nonobese (HCC)   . Glaucoma   . MG, ocular (myasthenia gravis) ([place])   . History of CVA (cerebrovascular accident) 02/24/2018  . Right homonymous hemianopsia 02/23/2018  . Unresponsive episode 02/22/2018  . UTI (urinary tract infection) 01/28/2018  . Abnormal chest CT 09/20/2017  . Double vision 01/21/2017  . Ascending aortic aneurysm ([place]) 12/22/2016  . Blood-tinged sputum 10/23/2016  . Cough 10/23/2016  . PAF (paroxysmal atrial fibrillation) ([place]) 09/14/2016  . Health care maintenance 12/26/2014  . Stress 07/05/2014  . Apnea, sleep 06/18/2014  . Migraine 05/10/2013  . Calculus of kidney 01/01/2013  . Hypertension 08/20/2012  . Hypercholesterolemia 08/20/2012  . CAD (coronary artery disease) 08/20/2012  . Benign localized hyperplasia of prostate without urinary obstruction and other lower urinary tract symptoms (LUTS) 02/28/2012    Past Surgical History:  Procedure Laterality Date  . APPENDECTOMY    . CAROTID ANGIOGRAPHY Bilateral 04/19/2018   Procedure: CAROTID ANGIOGRAPHY;  Surgeon: Algernon Huxley, MD;  Location: [place] [facility];  Service: Cardiovascular;  Laterality: Bilateral;  . CAROTID PTA/STENT INTERVENTION Right 05/22/2018   Procedure: CAROTID PTA/STENT INTERVENTION;  Surgeon: Katha Cabal, MD;  Location: [place] [facility];  Service: Cardiovascular;  Laterality: Right;  . CATARACT EXTRACTION W/ INTRAOCULAR LENS  IMPLANT, BILATERAL    . CHOLECYSTECTOMY     Removed  . CORONARY ARTERY BYPASS GRAFT  7/96  x4  . HEMORRHOID SURGERY    . HERNIA REPAIR    . TONSILLECTOMY    . UPP and tonsillectomy  1/86    Prior to Admission medications   Medication Sig Start Date End Date Taking? Authorizing Provider  ALPRAZolam (XANAX) 0.25 MG tablet Take 0.25 mg by mouth 2 (two) times daily as needed for anxiety or sleep.    Yes [provider]  apixaban (ELIQUIS) 5  MG TABS tablet Take 1 tablet (5 mg total) by mouth 2 (two) times daily. 04/01/18  Yes Angiulli, Lavon Paganini, PA-C  aspirin EC 81 MG EC tablet Take 1 tablet (81 mg total) by mouth daily. 06/01/18  Yes Schnier, Dolores Lory, MD  atorvastatin (LIPITOR) 40 MG tablet Take 1 tablet (40 mg total) by mouth at bedtime. 12/02/18  Yes Einar Pheasant, MD  divalproex (DEPAKOTE ER) 250 MG 24 hr tablet TAKE 3 TABLETS BY MOUTH AT BEDTIME. Patient taking differently: Take 750 mg by mouth at bedtime.  01/29/19  Yes Einar Pheasant, MD  latanoprost (XALATAN) 0.005 % ophthalmic solution Place 1 drop into both eyes at bedtime.   Yes [provider]  loratadine (CLARITIN) 10 MG tablet Take 10 mg by mouth daily.   Yes [provider]  MELATONIN ER PO Take 1 tablet by mouth at bedtime.    Yes [provider]  nitroGLYCERIN (NITROSTAT) 0.4 MG SL tablet Place 0.4 mg under the tongue every 5 (five) minutes as needed for chest pain.    Yes [provider]  tiZANidine (ZANAFLEX) 4 MG tablet Take 1 tablet (4 mg total) by mouth daily as needed for muscle spasms. 02/18/19  Yes Einar Pheasant, MD  traZODone (DESYREL) 50 MG tablet Take 50 mg by mouth at bedtime.    Yes [provider]  albuterol (PROVENTIL HFA;VENTOLIN HFA) 108 (90 Base) MCG/ACT inhaler Inhale 2 puffs into the lungs every 6 (six) hours as needed for wheezing or shortness of breath. Patient not taking: Reported on 03/28/2019 12/13/18   Jodelle Green, FNP  fluticasone (FLONASE) 50 MCG/ACT nasal spray Place 2 sprays into both nostrils daily. Patient not taking: Reported on 03/28/2019 12/13/18   Jodelle Green, FNP  metoprolol succinate (TOPROL-XL) 25 MG 24 hr tablet Take 0.5 tablets (12.5 mg total) by mouth daily. Patient not taking: Reported on 03/28/2019 04/22/18   Saundra Shelling, MD    Allergies  Allergen Reactions  . Fentanyl Other (See Comments)    Reaction: confusion  . Flomax [Tamsulosin Hcl] Other (See Comments)    Unknown   . Lamisil [Terbinafine] Other (See Comments)    Unknown  . Levofloxacin Other (See Comments)    Diplopia. NEVER put patient on levoquin!  . Lorazepam Other (See Comments)    Reaction: confusion/ hallucinations   . Metformin And Related Diarrhea  . Sertraline Other (See Comments)    Unknown  . Sulfa Antibiotics Other (See Comments)    Unknown  . Versed [Midazolam] Other (See Comments)    Hallucinations and confusion  . Clarithromycin Diarrhea  . Codeine Other (See Comments)    Unknown    Family History  Problem Relation Age of Onset  . Heart disease Mother        died age 27  . Heart disease Father        and other vascular disease  . Diabetes Other        four siblings  . Heart disease Brother        s/p CABG  .  Colon cancer Neg Hx   . Prostate cancer Neg Hx     Social History Social History   Tobacco Use  . Smoking status: Never Smoker  . Smokeless tobacco: Never Used  Substance Use Topics  . Alcohol use: No    Alcohol/week: 0.0 standard drinks  . Drug use: No    Review of Systems Unable to complete adequate/accurate review of systems secondary to baseline dementia/confusion.  ____________________________________________   PHYSICAL EXAM:  VITAL SIGNS: ED Triage Vitals  Enc Vitals Group     BP 03/28/19 1016 (!) 216/98     Pulse Rate 03/28/19 1016 83     Resp 03/28/19 1016 18     Temp 03/28/19 1016 (!) 100.5 F (38.1 C)     Temp Source 03/28/19 1016 Oral     SpO2 03/28/19 1016 97 %     Weight 03/28/19 1017 170 lb (77.1 kg)     Height 03/28/19 1017 5\' 8"  (1.727 m)     Head Circumference --      Peak Flow --      Pain Score --      Pain Loc --      Pain Edu? --      Excl. in [place]? --     Constitutional: Patient is awake and alert, no distress.  Unable to answer questions appropriately or follow commands appropriately Eyes: Normal exam ENT      Head: Normocephalic and atraumatic.      Mouth/Throat: Mucous membranes are moist. Cardiovascular:  Normal rate, regular rhythm.  Respiratory: Normal respiratory effort without tachypnea nor retractions. Breath sounds are clear, without obvious wheeze rales or rhonchi Gastrointestinal: Soft and nontender. No distention.  No reaction to abdominal palpation. Genitourinary: Patient does have a small amount of bruising to his pubic bone area, small amount of bruising to the shaft of the penis as well as on the scrotum.  Testicles themselves appear nontender.  Small furuncle on scrotum Musculoskeletal: Nontender with normal range of motion in all extremities. No lower extremity tenderness or edema. Neurologic:  Normal speech and language. No gross focal neurologic deficits are appreciated. Skin:  Skin is warm.  Small furuncle on scrotum but no significant scrotal wall thickness edema or swelling. Psychiatric: Mood and affect are normal.   ____________________________________________    EKG  EKG viewed and interpreted by myself shows a normal sinus rhythm at 69 bpm with a narrow QRS, normal axis, normal intervals, no concerning ST changes.  ____________________________________________    RADIOLOGY  Chest x-ray shows poor inspiration but no acute findings.  ____________________________________________   INITIAL IMPRESSION / ASSESSMENT AND PLAN / ED COURSE  Pertinent labs & imaging results that were available during my care of the patient were reviewed by me and considered in my medical decision making (see chart for details).   Patient presents to the emergency department from home, found to be febrile, has dementia and cannot contribute to his history.  Does have bruising to the groin area, that would be consistent for possibly a fall.  Patient is febrile we will check labs for sepsis, start broad-spectrum antibiotics while awaiting results.  Lactic acid elevated to 2.5 consistent with infection versus dehydration.  White blood cell count is normal.  Troponin negative.  Given the  patient's elevated lactate and fever we will continue IV antibiotics and admit to the hospitalist service for sepsis of unknown origin.  I have added a procalcitonin onto the patient's lab work.  COVID test is negative.  X-ray does show possible right lower lobe atelectasis this could be a focus of infection possibly.  Ultrasound of the scrotum is negative.  Patient did become acutely agitated while attempting to obtain ultrasound images.  Given Haldol with good response.  Banks Chaikin was evaluated in Emergency Department on 03/28/2019 for the symptoms described in the history of present illness. He was evaluated in the context of the global COVID-19 pandemic, which necessitated consideration that the patient might be at risk for infection with the SARS-CoV-2 virus that causes COVID-19. Institutional protocols and algorithms that pertain to the evaluation of patients at risk for COVID-19 are in a state of rapid change based on information released by regulatory bodies including the CDC and federal and state organizations. These policies and algorithms were followed during the patient's care in the ED.  CRITICAL CARE Performed by: Harvest Dark   Total critical care time: 30 minutes  Critical care time was exclusive of separately billable procedures and treating other patients.  Critical care was necessary to treat or prevent imminent or life-threatening deterioration.  Critical care was time spent personally by me on the following activities: development of treatment plan with patient and/or surrogate as well as nursing, discussions with consultants, evaluation of patient's response to treatment, examination of patient, obtaining history from patient or surrogate, ordering and performing treatments and interventions, ordering and review of laboratory studies, ordering and review of radiographic studies, pulse oximetry and re-evaluation of patient's  condition.   ____________________________________________   FINAL CLINICAL IMPRESSION(S) / ED DIAGNOSES  Altered mental status Sepsis   Harvest Dark, MD 03/28/19 1417

## 2019-03-28 NOTE — Progress Notes (Signed)
CODE SEPSIS - PHARMACY COMMUNICATION  **Broad Spectrum Antibiotics should be administered within 1 hour of Sepsis diagnosis**  Time Code Sepsis Called/Page Received: 1031  Antibiotics Ordered: cefepime, metronidazole, vancomycin  Time of 1st antibiotic administration: 1046  Additional action taken by pharmacy: none indicated   If necessary, Name of Provider/Nurse Contacted: [facility] Pharmacist  03/28/2019  10:32 AM

## 2019-03-28 NOTE — ED Notes (Signed)
Pt wife reports normally pt is alert and talking to you but does have some dementia and also sun downs. She reports she gave him a xanax before coming.

## 2019-03-28 NOTE — ED Notes (Signed)
ED TO INPATIENT HANDOFF REPORT  ED Nurse Name Sam and Phone #: [phone]  S Name/Age/Gender Kyle Richardson 85 y.o. male Room/Bed: ED26A/ED26A  Code Status   Code Status: Prior  Home/SNF/Other Home {Patient disoriented  Is this baseline? Yes   Triage Complete: Triage complete  Chief Complaint testicle pain  Triage Note Pt started with testicle pain over night per EMS. Pt has sitters at house with wife.  Hx dementia. Pt cannot tell RN name. Unsure of pain. Febrile with oral temp while mouth open. Difficult to assess.    Allergies Allergies  Allergen Reactions  . Fentanyl Other (See Comments)    Reaction: confusion  . Flomax [Tamsulosin Hcl] Other (See Comments)    Unknown  . Lamisil [Terbinafine] Other (See Comments)    Unknown  . Levofloxacin Other (See Comments)    Diplopia. NEVER put patient on levoquin!  . Lorazepam Other (See Comments)    Reaction: confusion/ hallucinations   . Metformin And Related Diarrhea  . Sertraline Other (See Comments)    Unknown  . Sulfa Antibiotics Other (See Comments)    Unknown  . Versed [Midazolam] Other (See Comments)    Hallucinations and confusion  . Clarithromycin Diarrhea  . Codeine Other (See Comments)    Unknown    Level of Care/Admitting Diagnosis ED Disposition    ED Disposition Condition [facility]: [facility] [[id number]]  Level of Care: Med-Surg [16]  Covid Evaluation: Confirmed COVID Negative  Diagnosis: Acute encephalopathy [[id number]]  Admitting Physician: Dustin Flock [[id number]]  Attending Physician: Dustin Flock [[id number]]  Estimated length of stay: past midnight tomorrow  Certification:: I certify this patient will need inpatient services for at least 2 midnights  PT Class (Do Not Modify): Inpatient [101]  PT Acc Code (Do Not Modify): Private [1]       B Medical/Surgery History Past Medical History:  Diagnosis Date  . Abdominal fibromatosis   . CAD (coronary  artery disease)    s/p inferior MI with RV involvement 1/96.  s/p PTCA of the mid RCA 1/96, also  s/p  CABG x 4 7/96  . Complication of anesthesia    confusion and hallucinations for several days after receiving Versed  . Degenerative disc disease   . Dementia ([place])   . Diabetes mellitus ([place])   . Glaucoma   . Hypercholesterolemia   . Hypertension   . Migraine headache    history of  . Myocardial infarction ([place]) 1996  . Osteoarthritis   . Sleep apnea   . Stroke [facility]) 02/22/2017   affected left side    Past Surgical History:  Procedure Laterality Date  . APPENDECTOMY    . CAROTID ANGIOGRAPHY Bilateral 04/19/2018   Procedure: CAROTID ANGIOGRAPHY;  Surgeon: Algernon Huxley, MD;  Location: [facility];  Service: Cardiovascular;  Laterality: Bilateral;  . CAROTID PTA/STENT INTERVENTION Right 05/22/2018   Procedure: CAROTID PTA/STENT INTERVENTION;  Surgeon: Katha Cabal, MD;  Location: [facility];  Service: Cardiovascular;  Laterality: Right;  . CATARACT EXTRACTION W/ INTRAOCULAR LENS  IMPLANT, BILATERAL    . CHOLECYSTECTOMY     Removed  . CORONARY ARTERY BYPASS GRAFT  7/96   x4  . HEMORRHOID SURGERY    . HERNIA REPAIR    . TONSILLECTOMY    . UPP and tonsillectomy  1/86     A IV Location/Drains/Wounds Patient Lines/Drains/Airways Status   Active Line/Drains/Airways    Name:   Placement date:  Placement time:   Site:   Days:   Peripheral IV 03/28/19 Left Hand   03/28/19    1030    Hand   less than 1   Peripheral IV 03/28/19 Right Wrist   03/28/19    1020    Wrist   less than 1          Intake/Output Last 24 hours  Intake/Output Summary (Last 24 hours) at 03/28/2019 1606 Last data filed at 03/28/2019 1605 Gross per 24 hour  Intake 100 ml  Output -  Net 100 ml    Labs/Imaging Results for orders placed or performed during the hospital encounter of 03/28/19 (from the past 48 hour(s))  Lactic acid, plasma     Status: Abnormal   Collection Time:  03/28/19 10:23 AM  Result Value Ref Range   Lactic Acid, Venous 2.5 (HH) 0.5 - 1.9 mmol/L    Comment: CRITICAL RESULT CALLED TO, READ BACK BY AND VERIFIED WITH BRANDY DAVIS RN AT 1122 ON 03/28/19 SG/DS Performed at [facility], [address] [place], [place] [address]   Comprehensive metabolic panel     Status: Abnormal   Collection Time: 03/28/19 10:23 AM  Result Value Ref Range   Sodium 136 135 - 145 mmol/L   Potassium 4.5 3.5 - 5.1 mmol/L   Chloride 102 98 - 111 mmol/L   CO2 24 22 - 32 mmol/L   Glucose, Bld 170 (H) 70 - 99 mg/dL   BUN 10 8 - 23 mg/dL   Creatinine, Ser 0.85 0.61 - 1.24 mg/dL   Calcium 8.6 (L) 8.9 - 10.3 mg/dL   Total Protein 6.3 (L) 6.5 - 8.1 g/dL   Albumin 3.6 3.5 - 5.0 g/dL   AST 26 15 - 41 U/L   ALT 27 0 - 44 U/L   Alkaline Phosphatase 61 38 - 126 U/L   Total Bilirubin 1.2 0.3 - 1.2 mg/dL   GFR calc non Af Amer >60 >60 mL/min   GFR calc Af Amer >60 >60 mL/min   Anion gap 10 5 - 15    Comment: Performed at [facility], [address] [place], [place] [address]  CBC WITH DIFFERENTIAL     Status: None   Collection Time: 03/28/19 10:23 AM  Result Value Ref Range   WBC 8.8 4.0 - 10.5 K/uL   RBC 4.53 4.22 - 5.81 MIL/uL   Hemoglobin 14.8 13.0 - 17.0 g/dL   HCT 43.4 39.0 - 52.0 %   MCV 95.8 80.0 - 100.0 fL   MCH 32.7 26.0 - 34.0 pg   MCHC 34.1 30.0 - 36.0 g/dL   RDW 12.5 11.5 - 15.5 %   Platelets 183 150 - 400 K/uL   nRBC 0.0 0.0 - 0.2 %   Neutrophils Relative % 71 %   Neutro Abs 6.3 1.7 - 7.7 K/uL   Lymphocytes Relative 18 %   Lymphs Abs 1.6 0.7 - 4.0 K/uL   Monocytes Relative 8 %   Monocytes Absolute 0.7 0.1 - 1.0 K/uL   Eosinophils Relative 3 %   Eosinophils Absolute 0.3 0.0 - 0.5 K/uL   Basophils Relative 0 %   Basophils Absolute 0.0 0.0 - 0.1 K/uL   Immature Granulocytes 0 %   Abs Immature Granulocytes 0.03 0.00 - 0.07 K/uL    Comment: Performed at [facility], [address] [place], [place] [address]   Urinalysis, Routine w reflex microscopic     Status: Abnormal   Collection Time: 03/28/19  10:23 AM  Result Value Ref Range   Color, Urine YELLOW (A) YELLOW   APPearance CLOUDY (A) CLEAR   Specific Gravity, Urine 1.014 1.005 - 1.030   pH 8.0 5.0 - 8.0   Glucose, UA 50 (A) NEGATIVE mg/dL   Hgb urine dipstick NEGATIVE NEGATIVE   Bilirubin Urine NEGATIVE NEGATIVE   Ketones, ur 5 (A) NEGATIVE mg/dL   Protein, ur NEGATIVE NEGATIVE mg/dL   Nitrite NEGATIVE NEGATIVE   Leukocytes,Ua NEGATIVE NEGATIVE   RBC / HPF 6-10 0 - 5 RBC/hpf   WBC, UA 0-5 0 - 5 WBC/hpf   Bacteria, UA RARE (A) NONE SEEN   Squamous Epithelial / LPF 0-5 0 - 5   Amorphous Crystal PRESENT     Comment: Performed at [facility], [address] [place], [place] [address]  Troponin I - ONCE - STAT     Status: None   Collection Time: 03/28/19 10:23 AM  Result Value Ref Range   Troponin I <0.03 <0.03 ng/mL    Comment: Performed at [facility], [address] [place], [place] [address]  Procalcitonin     Status: None   Collection Time: 03/28/19 10:29 AM  Result Value Ref Range   Procalcitonin <0.10 ng/mL    Comment:        Interpretation: PCT (Procalcitonin) <= 0.5 ng/mL: Systemic infection (sepsis) is not likely. Local bacterial infection is possible. (NOTE)       Sepsis PCT Algorithm           Lower Respiratory Tract                                      Infection PCT Algorithm    ----------------------------     ----------------------------         PCT < 0.25 ng/mL                PCT < 0.10 ng/mL         Strongly encourage             Strongly discourage   discontinuation of antibiotics    initiation of antibiotics    ----------------------------     -----------------------------       PCT 0.25 - 0.50 ng/mL            PCT 0.10 - 0.25 ng/mL               OR       >80% decrease in PCT            Discourage initiation of                                            antibiotics      Encourage  discontinuation           of antibiotics    ----------------------------     -----------------------------         PCT >= 0.50 ng/mL              PCT 0.26 - 0.50 ng/mL               AND        <80% decrease in PCT             Encourage initiation of  antibiotics       Encourage continuation           of antibiotics    ----------------------------     -----------------------------        PCT >= 0.50 ng/mL                  PCT > 0.50 ng/mL               AND         increase in PCT                  Strongly encourage                                      initiation of antibiotics    Strongly encourage escalation           of antibiotics                                     -----------------------------                                           PCT <= 0.25 ng/mL                                                 OR                                        > 80% decrease in PCT                                     Discontinue / Do not initiate                                             antibiotics Performed at [facility], [address] [place], [place] [address]   SARS Coronavirus 2 (CEPHEID- Performed in [facility] lab), Hosp Order     Status: None   Collection Time: 03/28/19 10:34 AM   Specimen: Nasopharyngeal  Result Value Ref Range   SARS Coronavirus 2 NEGATIVE NEGATIVE    Comment: (NOTE) If result is NEGATIVE SARS-CoV-2 target nucleic acids are NOT DETECTED. The SARS-CoV-2 RNA is generally detectable in upper and lower  respiratory specimens during the acute phase of infection. The lowest  concentration of SARS-CoV-2 viral copies this assay can detect is 250  copies / mL. A negative result does not preclude SARS-CoV-2 infection  and should not be used as the sole basis for treatment or other  patient management decisions.  A negative result may occur with  improper specimen collection / handling, submission of  specimen other  than nasopharyngeal swab, presence of viral mutation(s) within the  areas targeted by this assay, and inadequate number of viral  copies  (<250 copies / mL). A negative result must be combined with clinical  observations, patient history, and epidemiological information. If result is POSITIVE SARS-CoV-2 target nucleic acids are DETECTED. The SARS-CoV-2 RNA is generally detectable in upper and lower  respiratory specimens dur ing the acute phase of infection.  Positive  results are indicative of active infection with SARS-CoV-2.  Clinical  correlation with patient history and other diagnostic information is  necessary to determine patient infection status.  Positive results do  not rule out bacterial infection or co-infection with other viruses. If result is PRESUMPTIVE POSTIVE SARS-CoV-2 nucleic acids MAY BE PRESENT.   A presumptive positive result was obtained on the submitted specimen  and confirmed on repeat testing.  While 2019 novel coronavirus  (SARS-CoV-2) nucleic acids may be present in the submitted sample  additional confirmatory testing may be necessary for epidemiological  and / or clinical management purposes  to differentiate between  SARS-CoV-2 and other Sarbecovirus currently known to infect humans.  If clinically indicated additional testing with an alternate test  methodology [phone]) is advised. The SARS-CoV-2 RNA is generally  detectable in upper and lower respiratory sp ecimens during the acute  phase of infection. The expected result is Negative. Fact Sheet for Patients:  StrictlyIdeas.no Fact Sheet for Healthcare Providers: BankingDealers.co.za This test is not yet approved or cleared by the [place] FDA and has been authorized for detection and/or diagnosis of SARS-CoV-2 by FDA under an Emergency Use Authorization (EUA).  This EUA will remain in effect (meaning this test can be used) for the  duration of the COVID-19 declaration under Section 564(b)(1) of the Act, 21 U.S.C. section 360bbb-3(b)(1), unless the authorization is terminated or revoked sooner. Performed at [facility], [address] [place], [place] [address]   Lactic acid, plasma     Status: Abnormal   Collection Time: 03/28/19 12:23 PM  Result Value Ref Range   Lactic Acid, Venous 2.2 (HH) 0.5 - 1.9 mmol/L    Comment: CRITICAL RESULT CALLED TO, READ BACK BY AND VERIFIED WITH Arnoldo Morale RN AT 1252 03/28/19 SG/LMLK Performed at [facility], [address] [place], [place] [address]    Dg Chest Port 1 View  Result Date: 03/28/2019 CLINICAL DATA:  Sepsis. EXAM: PORTABLE CHEST 1 VIEW COMPARISON:  04/15/2018. FINDINGS: Normal sized heart. Stable post CABG changes. Poor inspiration with minimal right basilar atelectasis. Minimal bilateral shoulder degenerative changes. IMPRESSION: Poor inspiration with minimal right basilar atelectasis. Electronically Signed   By: Claudie Revering M.D.   On: 03/28/2019 10:57   US Scrotum W/doppler  Result Date: 03/28/2019 CLINICAL DATA:  Initial evaluation for acute testicular pain. EXAM: SCROTAL ULTRASOUND DOPPLER ULTRASOUND OF THE TESTICLES TECHNIQUE: Complete ultrasound examination of the testicles, epididymis, and other scrotal structures was performed. Color and spectral Doppler ultrasound were also utilized to evaluate blood flow to the testicles. COMPARISON:  None available. FINDINGS: Right testicle Measurements: 3.7 x 2.4 x 2.9 cm. No mass lesion. Few punctate microcalcifications noted without frank microlithiasis. Left testicle Measurements: 4.2 x 1.8 x 3.1 cm. No mass lesion. Few punctate microcalcifications noted without frank microlithiasis. Right epididymis:  Normal in size and appearance. Left epididymis: Normal in size and appearance. Incidental note made of a 2-3 mm simple cyst at the epididymal head, most consistent with a small benign epididymal  cyst/spermatocele. Finding felt to be incidental in nature and most likely inconsequential. Hydrocele:  Small bilateral hydroceles. Varicocele:  None visualized. Pulsed Doppler interrogation of both testes  demonstrates normal low resistance arterial and venous waveforms bilaterally. IMPRESSION: 1. Small bilateral hydroceles. 2. Otherwise negative scrotal ultrasound. No evidence for torsion or other acute abnormality. Electronically Signed   By: Jeannine Boga M.D.   On: 03/28/2019 13:59    Pending Labs Unresulted Labs (From admission, onward)    Start     Ordered   03/28/19 1030  Urine culture  ONCE - STAT,   STAT     03/28/19 1029   03/28/19 1029  Blood Culture (routine x 2)  BLOOD CULTURE X 2,   STAT     03/28/19 1029   Signed and Held  Urine culture  Once,   R     Signed and Held   Signed and Held  TSH  Once,   R     Signed and Held   Signed and Held  CBC  Tomorrow morning,   R     Signed and Held   Signed and Held  Basic metabolic panel  Tomorrow morning,   R     Signed and Held          Vitals/Pain Today's Vitals   03/28/19 1330 03/28/19 1400 03/28/19 1430 03/28/19 1500  BP: (!) 193/98  (!) 162/102 (!) 172/93  Pulse: 76 81 81 81  Resp: 20  17 20   Temp:      TempSrc:      SpO2: 95% 95% 94% 92%  Weight:      Height:        Isolation Precautions Droplet and Contact precautions  Medications Medications  vancomycin (VANCOCIN) 1,250 mg in sodium chloride 0.9 % 250 mL IVPB (has no administration in time range)  ceFEPIme (MAXIPIME) 2 g in sodium chloride 0.9 % 100 mL IVPB (has no administration in time range)  haloperidol lactate (HALDOL) injection 2 mg (has no administration in time range)  cefTRIAXone (ROCEPHIN) 1 g in sodium chloride 0.9 % 100 mL IVPB (0 g Intravenous Stopped 03/28/19 1605)  ceFEPIme (MAXIPIME) 2 g in sodium chloride 0.9 % 100 mL IVPB (0 g Intravenous Stopped 03/28/19 1119)  metroNIDAZOLE (FLAGYL) IVPB 500 mg (0 mg Intravenous Stopped 03/28/19 1157)   sodium chloride 0.9 % bolus 1,000 mL (0 mLs Intravenous Stopped 03/28/19 1410)  vancomycin (VANCOCIN) 2,000 mg in sodium chloride 0.9 % 500 mL IVPB (0 mg Intravenous Stopped 03/28/19 1410)  haloperidol lactate (HALDOL) injection 5 mg (5 mg Intravenous Given 03/28/19 1240)    Mobility non-ambulatory High fall risk   Focused Assessments testicle pain   R Recommendations: See Admitting Provider Note  Report given to:   Additional Notes:

## 2019-03-28 NOTE — ED Triage Notes (Signed)
Pt started with testicle pain over night per EMS. Pt has sitters at house with wife.  Hx dementia. Pt cannot tell RN name. Unsure of pain. Febrile with oral temp while mouth open. Difficult to assess.

## 2019-03-28 NOTE — ED Notes (Signed)
Patient transported to CT 

## 2019-03-28 NOTE — ED Notes (Signed)
Family at bedside. 

## 2019-03-28 NOTE — ED Notes (Signed)
Pt cleaned up and linens changed. Pt resting comfortably at this time

## 2019-03-28 NOTE — ED Notes (Signed)
Kyle Richardson NT remains at bedside. Pt pulled to middle of bed.  Remains to try and put head on rail and keeps shoving arm between rail and bed despite seizure pads on bed.

## 2019-03-28 NOTE — H&P (Addendum)
[facility] [place] NAME: [name]    MR#:  [id number]  DATE OF BIRTH:  06-28-1928  DATE OF ADMISSION:  03/28/2019  PRIMARY CARE PHYSICIAN: Einar Pheasant, MD   REQUESTING/REFERRING PHYSICIAN: Harvest Dark MD  CHIEF COMPLAINT:   Chief Complaint  Patient presents with  . Testicle Pain    HISTORY OF PRESENT ILLNESS: Kyle Richardson  is a 85 y.o. male with a known history of essential hypertension, MI, CVA, dementia presented to the hospital with testicular pain.  Patient lives with his wife and has around-the-clock sitters.  According to the history obtained by the ED nurse patient apparently had a fever.  He is confused and unable to provide any review of systems.  I try to reach patient's wife unable to provide any review of systems PAST MEDICAL HISTORY:   Past Medical History:  Diagnosis Date  . Abdominal fibromatosis   . 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  s/p PTCA of the mid RCA 1/96, also  s/p  CABG x 4 7/96  . Complication of anesthesia    confusion and hallucinations for several days after receiving Versed  . Degenerative disc disease   . Dementia ([place])   . Diabetes mellitus ([place])   . Glaucoma   . Hypercholesterolemia   . Hypertension   . Migraine headache    history of  . Myocardial infarction ([place]) 1996  . Osteoarthritis   . Sleep apnea   . Stroke ([place]) 02/22/2017   affected left side     PAST SURGICAL HISTORY:  Past Surgical History:  Procedure Laterality Date  . APPENDECTOMY    . CAROTID ANGIOGRAPHY Bilateral 04/19/2018   Procedure: CAROTID ANGIOGRAPHY;  Surgeon: Algernon Huxley, MD;  Location: [facility];  Service: Cardiovascular;  Laterality: Bilateral;  . CAROTID PTA/STENT INTERVENTION Right 05/22/2018   Procedure: CAROTID PTA/STENT INTERVENTION;  Surgeon: Katha Cabal, MD;  Location: [facility];  Service: Cardiovascular;  Laterality: Right;   . CATARACT EXTRACTION W/ INTRAOCULAR LENS  IMPLANT, BILATERAL    . CHOLECYSTECTOMY     Removed  . CORONARY ARTERY BYPASS GRAFT  7/96   x4  . HEMORRHOID SURGERY    . HERNIA REPAIR    . TONSILLECTOMY    . UPP and tonsillectomy  1/86    SOCIAL HISTORY:  Social History   Tobacco Use  . Smoking status: Never Smoker  . Smokeless tobacco: Never Used  Substance Use Topics  . Alcohol use: No    Alcohol/week: 0.0 standard drinks    FAMILY HISTORY:  Family History  Problem Relation Age of Onset  . Heart disease Mother        died age 41  . Heart disease Father        and other vascular disease  . Diabetes Other        four siblings  . Heart disease Brother        s/p CABG  . Colon cancer Neg Hx   . Prostate cancer Neg Hx     DRUG ALLERGIES:  Allergies  Allergen Reactions  . Fentanyl Other (See Comments)    Reaction: confusion  . Flomax [Tamsulosin Hcl] Other (See Comments)    Unknown  . Lamisil [Terbinafine] Other (See Comments)    Unknown  . Levofloxacin Other (See Comments)    Diplopia. NEVER put patient on levoquin!  . Lorazepam Other (See Comments)  Reaction: confusion/ hallucinations   . Metformin And Related Diarrhea  . Sertraline Other (See Comments)    Unknown  . Sulfa Antibiotics Other (See Comments)    Unknown  . Versed [Midazolam] Other (See Comments)    Hallucinations and confusion  . Clarithromycin Diarrhea  . Codeine Other (See Comments)    Unknown    REVIEW OF SYSTEMS:   CONSTITUTIONAL: Unable to provide due to mental status MEDICATIONS AT HOME:  Prior to Admission medications   Medication Sig Start Date End Date Taking? Authorizing Provider  ALPRAZolam (XANAX) 0.25 MG tablet Take 0.25 mg by mouth 2 (two) times daily as needed for anxiety or sleep.    Yes [provider]  apixaban (ELIQUIS) 5 MG TABS tablet Take 1 tablet (5 mg total) by mouth 2 (two) times daily. 04/01/18  Yes Angiulli, Lavon Paganini, PA-C  aspirin EC 81 MG EC tablet  Take 1 tablet (81 mg total) by mouth daily. 06/01/18  Yes Schnier, Dolores Lory, MD  atorvastatin (LIPITOR) 40 MG tablet Take 1 tablet (40 mg total) by mouth at bedtime. 12/02/18  Yes Einar Pheasant, MD  divalproex (DEPAKOTE ER) 250 MG 24 hr tablet TAKE 3 TABLETS BY MOUTH AT BEDTIME. Patient taking differently: Take 750 mg by mouth at bedtime.  01/29/19  Yes Einar Pheasant, MD  latanoprost (XALATAN) 0.005 % ophthalmic solution Place 1 drop into both eyes at bedtime.   Yes [provider]  loratadine (CLARITIN) 10 MG tablet Take 10 mg by mouth daily.   Yes [provider]  MELATONIN ER PO Take 1 tablet by mouth at bedtime.    Yes [provider]  nitroGLYCERIN (NITROSTAT) 0.4 MG SL tablet Place 0.4 mg under the tongue every 5 (five) minutes as needed for chest pain.    Yes [provider]  tiZANidine (ZANAFLEX) 4 MG tablet Take 1 tablet (4 mg total) by mouth daily as needed for muscle spasms. 02/18/19  Yes Einar Pheasant, MD  traZODone (DESYREL) 50 MG tablet Take 50 mg by mouth at bedtime.    Yes [provider]  albuterol (PROVENTIL HFA;VENTOLIN HFA) 108 (90 Base) MCG/ACT inhaler Inhale 2 puffs into the lungs every 6 (six) hours as needed for wheezing or shortness of breath. Patient not taking: Reported on 03/28/2019 12/13/18   Jodelle Green, FNP  fluticasone (FLONASE) 50 MCG/ACT nasal spray Place 2 sprays into both nostrils daily. Patient not taking: Reported on 03/28/2019 12/13/18   Jodelle Green, FNP  metoprolol succinate (TOPROL-XL) 25 MG 24 hr tablet Take 0.5 tablets (12.5 mg total) by mouth daily. Patient not taking: Reported on 03/28/2019 04/22/18   Saundra Shelling, MD      PHYSICAL EXAMINATION:   VITAL SIGNS: Blood pressure (!) 193/98, pulse 81, temperature (!) 100.5 F (38.1 C), temperature source Oral, resp. rate 20, height 5\' 8"  (1.727 m), weight 77.1 kg, SpO2 95 %.  GENERAL:  85 y.o.-year-old patient lying in the bed agitation EYES: Pupils equal,  round, reactive to light and accommodation. No scleral icterus. Extraocular muscles intact.  HEENT: Head atraumatic, normocephalic. Oropharynx and nasopharynx clear.  NECK:  Supple, no jugular venous distention. No thyroid enlargement, no tenderness.  LUNGS: Normal breath sounds bilaterally, no wheezing, rales,rhonchi or crepitation. No use of accessory muscles of respiration.  CARDIOVASCULAR: S1, S2 normal. No murmurs, rubs, or gallops.  ABDOMEN: Soft, nontender, nondistended. Bowel sounds present. No organomegaly or mass.  EXTREMITIES: No pedal edema, cyanosis, or clubbing.  NEUROLOGIC: Patient agitated not able to  follow commands PSYCHIATRIC: The patient is confused SKIN: No obvious rash, lesion, or ulcer.   LABORATORY PANEL:   CBC Recent Labs  Lab 03/28/19 1023  WBC 8.8  HGB 14.8  HCT 43.4  PLT 183  MCV 95.8  MCH 32.7  MCHC 34.1  RDW 12.5  LYMPHSABS 1.6  MONOABS 0.7  EOSABS 0.3  BASOSABS 0.0   ------------------------------------------------------------------------------------------------------------------  Chemistries  Recent Labs  Lab 03/28/19 1023  NA 136  K 4.5  CL 102  CO2 24  GLUCOSE 170*  BUN 10  CREATININE 0.85  CALCIUM 8.6*  AST 26  ALT 27  ALKPHOS 61  BILITOT 1.2   ------------------------------------------------------------------------------------------------------------------ estimated creatinine clearance is 57 mL/min (by C-G formula based on SCr of 0.85 mg/dL). ------------------------------------------------------------------------------------------------------------------ No results for input(s): TSH, T4TOTAL, T3FREE, THYROIDAB in the last 72 hours.  Invalid input(s): FREET3   Coagulation profile No results for input(s): INR, PROTIME in the last 168 hours. ------------------------------------------------------------------------------------------------------------------- No results for input(s): DDIMER in the last 72  hours. -------------------------------------------------------------------------------------------------------------------  Cardiac Enzymes Recent Labs  Lab 03/28/19 1023  TROPONINI <0.03   ------------------------------------------------------------------------------------------------------------------ Invalid input(s): POCBNP  ---------------------------------------------------------------------------------------------------------------  Urinalysis    Component Value Date/Time   COLORURINE YELLOW (A) 03/28/2019 1023   APPEARANCEUR CLOUDY (A) 03/28/2019 1023   APPEARANCEUR Hazy 12/23/2012 0956   LABSPEC 1.014 03/28/2019 1023   LABSPEC 1.025 12/23/2012 0956   PHURINE 8.0 03/28/2019 1023   GLUCOSEU 50 (A) 03/28/2019 1023   GLUCOSEU NEGATIVE 01/02/2019 1227   HGBUR NEGATIVE 03/28/2019 1023   BILIRUBINUR NEGATIVE 03/28/2019 1023   BILIRUBINUR postivie 1+ 01/28/2018 0824   BILIRUBINUR Negative 12/23/2012 0956   KETONESUR 5 (A) 03/28/2019 1023   PROTEINUR NEGATIVE 03/28/2019 1023   UROBILINOGEN 0.2 01/02/2019 1227   NITRITE NEGATIVE 03/28/2019 1023   LEUKOCYTESUR NEGATIVE 03/28/2019 1023   LEUKOCYTESUR Negative 12/23/2012 0956     RADIOLOGY: Dg Chest Port 1 View  Result Date: 03/28/2019 CLINICAL DATA:  Sepsis. EXAM: PORTABLE CHEST 1 VIEW COMPARISON:  04/15/2018. FINDINGS: Normal sized heart. Stable post CABG changes. Poor inspiration with minimal right basilar atelectasis. Minimal bilateral shoulder degenerative changes. IMPRESSION: Poor inspiration with minimal right basilar atelectasis. Electronically Signed   By: Claudie Revering M.D.   On: 03/28/2019 10:57   US Scrotum W/doppler  Result Date: 03/28/2019 CLINICAL DATA:  Initial evaluation for acute testicular pain. EXAM: SCROTAL ULTRASOUND DOPPLER ULTRASOUND OF THE TESTICLES TECHNIQUE: Complete ultrasound examination of the testicles, epididymis, and other scrotal structures was performed. Color and spectral Doppler ultrasound  were also utilized to evaluate blood flow to the testicles. COMPARISON:  None available. FINDINGS: Right testicle Measurements: 3.7 x 2.4 x 2.9 cm. No mass lesion. Few punctate microcalcifications noted without frank microlithiasis. Left testicle Measurements: 4.2 x 1.8 x 3.1 cm. No mass lesion. Few punctate microcalcifications noted without frank microlithiasis. Right epididymis:  Normal in size and appearance. Left epididymis: Normal in size and appearance. Incidental note made of a 2-3 mm simple cyst at the epididymal head, most consistent with a small benign epididymal cyst/spermatocele. Finding felt to be incidental in nature and most likely inconsequential. Hydrocele:  Small bilateral hydroceles. Varicocele:  None visualized. Pulsed Doppler interrogation of both testes demonstrates normal low resistance arterial and venous waveforms bilaterally. IMPRESSION: 1. Small bilateral hydroceles. 2. Otherwise negative scrotal ultrasound. No evidence for torsion or other acute abnormality. Electronically Signed   By: Jeannine Boga M.D.   On: 03/28/2019 13:59    EKG: Orders placed or performed during the hospital encounter of 03/28/19  .  ED EKG 12-Lead  . ED EKG 12-Lead    IMPRESSION AND PLAN: Patient is 85 year old brought to the hospital with testicular pain however he is very agitated and confused  1.  Acute encephalopathy etiology unclear CT scan of the head is not done because patient was very agitated I will try to obtain this He had fever which is unclear etiology we will treat along with empiric ceftriaxone  2.  Fever with sepsis etiology unclear I will check procalcitonin level Continue with ceftriaxone for now  3.  Hypertension patient will be unable to take any oral meds I will treat with IV hydralazine PRN  4.  Testicular pain ultrasound is negative  5.  Previous stroke  6.  Diabetes type 2 we will place on sliding scale insulin obtain his home medication  7.  Dementia unclear  what stage I try to reach the wife unable to reach she did not answer  8.  Coronary artery disease  9.  Chronic anticoagulation with Eliquis not sure the etiology will continue if able to take orally    All the records are reviewed and case discussed with ED provider. Management plans discussed with the patient, family and they are in agreement.  CODE STATUS: Code Status History    Date Active Date Inactive Code Status Order ID Comments User Context   10/10/2018 0254 10/12/2018 1951 Full Code [id number]  Arta Silence, MD Inpatient   05/22/2018 1004 05/31/2018 2014 Full Code [id number]  Katha Cabal, MD Inpatient   04/15/2018 2120 04/22/2018 2032 Full Code [id number]  Vaughan Basta, MD Inpatient   02/27/2018 1544 04/01/2018 1415 Full Code [id number]  Bary Leriche, PA-C Inpatient   02/27/2018 1544 02/27/2018 1544 Full Code [id number]  Bary Leriche, PA-C Inpatient   02/23/2018 0018 02/27/2018 1528 Full Code [id number]  Lance Coon, MD Inpatient   Advance Care Planning Activity       TOTAL TIME TAKING CARE OF THIS PATIENT:55 minutes.    Dustin Flock M.D on 03/28/2019 at 2:22 PM  Between 7am to 6pm - Pager - [phone]  After 6pm go to [email or web address] - password [email or web address]  [facility] Office  [phone]  CC: Primary care physician; Einar Pheasant, MD

## 2019-03-28 NOTE — ED Notes (Signed)
Pt moving all over. [place] unable to obtain exam.  Uncooperative.  Remains disoriented and unable to communicate clearly.

## 2019-03-28 NOTE — ED Notes (Addendum)
Pt diaper changed and pulled up in bed. Keeps scooting to side and putting legs through rails. Pulling at wires. Dr Truitt Merle notified.  Charge aware of pt and that may need sitter. Door to room open.

## 2019-03-28 NOTE — Consult Note (Signed)
Pharmacy Antibiotic Note  Kyle Richardson is a 85 y.o. male admitted on 03/28/2019 with sepsis - Unknown source.  Pharmacy has been consulted for Vancomycin and Cefepime dosing.  Metronidazole, Cefepime, and Vancomycin were ordered in the ED.   Plan: 1) Cefepime 2g Q8H- will start next dose @ 1900.   2) Will start loading dose of 2000 mg x1 of Vancomycin. Then will start Vancomycin 1250 mg Q24H @ 1200 on 03/29/19  AUC Goal 400-550 Expected AUC 460.2 Cssmin 10.8   Height: 5\' 8"  (172.7 cm) Weight: 170 lb (77.1 kg) IBW/kg (Calculated) : 68.4  Temp (24hrs), Avg:100.5 F (38.1 C), Min:100.5 F (38.1 C), Max:100.5 F (38.1 C)  Recent Labs  Lab 03/28/19 1023  WBC 8.8  CREATININE 0.85  LATICACIDVEN 2.5*    Estimated Creatinine Clearance: 57 mL/min (by C-G formula based on SCr of 0.85 mg/dL).    Allergies  Allergen Reactions  . Fentanyl Other (See Comments)    Reaction: confusion  . Flomax [Tamsulosin Hcl] Other (See Comments)    Unknown  . Lamisil [Terbinafine] Other (See Comments)    Unknown  . Levofloxacin Other (See Comments)    Diplopia. NEVER put patient on levoquin!  . Lorazepam Other (See Comments)    Reaction: confusion/ hallucinations   . Metformin And Related Diarrhea  . Sertraline Other (See Comments)    Unknown  . Sulfa Antibiotics Other (See Comments)    Unknown  . Versed [Midazolam] Other (See Comments)    Hallucinations and confusion  . Clarithromycin Diarrhea  . Codeine Other (See Comments)    Unknown    Antimicrobials this admission: 06/19 Vanc >> 6/19  Cefepime >>  Dose adjustments this admission: N/A  Microbiology results: 6/19 BCx: pending  6/19 UCx: pending   Thank you for allowing pharmacy to be a part of this patient's care.  Rowland Lathe 03/28/2019 11:29 AM

## 2019-03-29 LAB — CBC
HCT: 39 % (ref 39.0–52.0)
Hemoglobin: 13.2 g/dL (ref 13.0–17.0)
MCH: 32.4 pg (ref 26.0–34.0)
MCHC: 33.8 g/dL (ref 30.0–36.0)
MCV: 95.6 fL (ref 80.0–100.0)
Platelets: 158 10*3/uL (ref 150–400)
RBC: 4.08 MIL/uL — ABNORMAL LOW (ref 4.22–5.81)
RDW: 12.4 % (ref 11.5–15.5)
WBC: 6.4 10*3/uL (ref 4.0–10.5)
nRBC: 0 % (ref 0.0–0.2)

## 2019-03-29 LAB — URINE CULTURE: Culture: NO GROWTH

## 2019-03-29 LAB — BASIC METABOLIC PANEL
Anion gap: 9 (ref 5–15)
BUN: 9 mg/dL (ref 8–23)
CO2: 23 mmol/L (ref 22–32)
Calcium: 8.5 mg/dL — ABNORMAL LOW (ref 8.9–10.3)
Chloride: 107 mmol/L (ref 98–111)
Creatinine, Ser: 0.8 mg/dL (ref 0.61–1.24)
GFR calc Af Amer: >60 mL/min (ref >60)
GFR calc non Af Amer: >60 mL/min (ref >60)
Glucose, Bld: 145 mg/dL — ABNORMAL HIGH (ref 70–99)
Potassium: 3.6 mmol/L (ref 3.5–5.1)
Sodium: 139 mmol/L (ref 135–145)

## 2019-03-29 MED ORDER — CEPHALEXIN 500 MG PO CAPS: 500.0000 mg | ORAL_CAPSULE | Freq: Four times a day (QID) | ORAL | 0 refills | Status: DC

## 2019-03-29 NOTE — TOC Transition Note (Signed)
Transition of Care [facility]) - CM/SW Discharge Note   Patient Details  Name: Kyle Richardson MRN: [id number] Date of Birth: [birth year]  [facility] CM/SW Contact:  Willena Jeancharles, Lenice Llamas Phone Number: [phone]  03/29/2019, 3:00 PM   Clinical Narrative: Patient discharged from [facility] before Clinical Social Worker ([place]) could meet with him and arrange home health. CSW contacted patient and his wife Kyle Richardson answered the phone. Per wife she would like home health PT for her husband and requested [facility]. Per [facility] they can't accept patient. CSW contacted [facility] and [facility] and they could not accept the patient. Per Lauretta Chester home health representative they can accept patient for PT. Wife is agreeable to [facility] home health. Please reconsult if future social work needs arise. CSW signing off.    Final next level of care: [place] Barriers to Discharge: Barriers Resolved   Patient Goals and CMS Choice        Discharge Placement                       Discharge Plan and Services                          HH Arranged: PT [facility] Agency: [facility] Date [place]: 03/29/19 Time [place]: 1400 Representative spoke with at [place]: [place]  Social Determinants of Health ([place]) Interventions     Readmission Risk Interventions No flowsheet data found.

## 2019-03-29 NOTE — Discharge Summary (Signed)
[facility] NAME: Kyle Richardson    MR#:  [id number]  DATE OF BIRTH:  May 09, 1928  DATE OF ADMISSION:  03/28/2019 ADMITTING PHYSICIAN: Dustin Flock, MD  DATE OF DISCHARGE: No discharge date for patient encounter.  PRIMARY CARE PHYSICIAN: Einar Pheasant, MD    ADMISSION DIAGNOSIS:  Pain [R52] Altered mental status, unspecified altered mental status type [R41.82] Sepsis, due to unspecified organism, unspecified whether acute organ dysfunction present ([place]) [A41.9]  DISCHARGE DIAGNOSIS:  Active Problems:   Acute encephalopathy   SECONDARY DIAGNOSIS:   Past Medical History:  Diagnosis Date  . Abdominal fibromatosis   . 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  s/p PTCA of the mid RCA 1/96, also  s/p  CABG x 4 7/96  . Complication of anesthesia    confusion and hallucinations for several days after receiving Versed  . Degenerative disc disease   . Dementia ([place])   . Diabetes mellitus ([place])   . Glaucoma   . Hypercholesterolemia   . Hypertension   . Migraine headache    history of  . Myocardial infarction ([place]) 1996  . Osteoarthritis   . Sleep apnea   . Stroke [facility]) 02/22/2017   affected left side     HOSPITAL COURSE:  Patient is 85 year old brought to the hospital with testicular pain however he is very agitated and confused  1.  Acute encephalopathy  Resolved  Likely secondary to worsening dementia   2.  Fever with sepsis  Resolved  Treated with empiric Rocephin while in house   3.  Hypertension Stable on current regiment  4.  Testicular pain  Resolved ultrasound is negative Suspected due to possible epididymitis or mild orchitis, treated with Rocephin while in house, converted to Keflex to complete antibiotic course  5.  Previous stroke Stable  6.  Diabetes type 2  Stable on current regiment   7. Chronic anticoagulation  Continued Eliquis  Reason for  use unknown  DISCHARGE CONDITIONS:   stable  CONSULTS OBTAINED:    DRUG ALLERGIES:   Allergies  Allergen Reactions  . Fentanyl Other (See Comments)    Reaction: confusion  . Flomax [Tamsulosin Hcl] Other (See Comments)    Unknown  . Lamisil [Terbinafine] Other (See Comments)    Unknown  . Levofloxacin Other (See Comments)    Diplopia. NEVER put patient on levoquin!  . Lorazepam Other (See Comments)    Reaction: confusion/ hallucinations   . Metformin And Related Diarrhea  . Sertraline Other (See Comments)    Unknown  . Sulfa Antibiotics Other (See Comments)    Unknown  . Versed [Midazolam] Other (See Comments)    Hallucinations and confusion  . Clarithromycin Diarrhea  . Codeine Other (See Comments)    Unknown    DISCHARGE MEDICATIONS:   Allergies as of 03/29/2019      Reactions   Fentanyl Other (See Comments)   Reaction: confusion   Flomax [tamsulosin Hcl] Other (See Comments)   Unknown   Lamisil [terbinafine] Other (See Comments)   Unknown   Levofloxacin Other (See Comments)   Diplopia. NEVER put patient on levoquin!   Lorazepam Other (See Comments)   Reaction: confusion/ hallucinations    Metformin And Related Diarrhea   Sertraline Other (See Comments)   Unknown   Sulfa Antibiotics Other (See Comments)   Unknown   Versed [midazolam] Other (See Comments)   Hallucinations and confusion   Clarithromycin Diarrhea  Codeine Other (See Comments)   Unknown      Medication List    TAKE these medications   albuterol 108 (90 Base) MCG/ACT inhaler Commonly known as: VENTOLIN HFA Inhale 2 puffs into the lungs every 6 (six) hours as needed for wheezing or shortness of breath.   ALPRAZolam 0.25 MG tablet Commonly known as: XANAX Take 0.25 mg by mouth 2 (two) times daily as needed for anxiety or sleep.   apixaban 5 MG Tabs tablet Commonly known as: ELIQUIS Take 1 tablet (5 mg total) by mouth 2 (two) times daily.   aspirin 81 MG EC tablet Take 1 tablet  (81 mg total) by mouth daily.   atorvastatin 40 MG tablet Commonly known as: LIPITOR Take 1 tablet (40 mg total) by mouth at bedtime.   cephALEXin 500 MG capsule Commonly known as: Keflex Take 1 capsule (500 mg total) by mouth 4 (four) times daily.   divalproex 250 MG 24 hr tablet Commonly known as: DEPAKOTE ER TAKE 3 TABLETS BY MOUTH AT BEDTIME.   fluticasone 50 MCG/ACT nasal spray Commonly known as: FLONASE Place 2 sprays into both nostrils daily.   latanoprost 0.005 % ophthalmic solution Commonly known as: XALATAN Place 1 drop into both eyes at bedtime.   loratadine 10 MG tablet Commonly known as: CLARITIN Take 10 mg by mouth daily.   MELATONIN ER PO Take 1 tablet by mouth at bedtime.   metoprolol succinate 25 MG 24 hr tablet Commonly known as: TOPROL-XL Take 0.5 tablets (12.5 mg total) by mouth daily.   nitroGLYCERIN 0.4 MG SL tablet Commonly known as: NITROSTAT Place 0.4 mg under the tongue every 5 (five) minutes as needed for chest pain.   tiZANidine 4 MG tablet Commonly known as: ZANAFLEX Take 1 tablet (4 mg total) by mouth daily as needed for muscle spasms.   traZODone 50 MG tablet Commonly known as: DESYREL Take 50 mg by mouth at bedtime.        DISCHARGE INSTRUCTIONS:  If you experience worsening of your admission symptoms, develop shortness of breath, life threatening emergency, suicidal or homicidal thoughts you must seek medical attention immediately by calling 911 or calling your MD immediately  if symptoms less severe.  You Must read complete instructions/literature along with all the possible adverse reactions/side effects for all the Medicines you take and that have been prescribed to you. Take any new Medicines after you have completely understood and accept all the possible adverse reactions/side effects.   Please note  You were cared for by a hospitalist during your hospital stay. If you have any questions about your discharge medications or  the care you received while you were in the hospital after you are discharged, you can call the unit and asked to speak with the hospitalist on call if the hospitalist that took care of you is not available. Once you are discharged, your primary care physician will handle any further medical issues. Please note that NO REFILLS for any discharge medications will be authorized once you are discharged, as it is imperative that you return to your primary care physician (or establish a relationship with a primary care physician if you do not have one) for your aftercare needs so that they can reassess your need for medications and monitor your lab values.    Today   CHIEF COMPLAINT:   Chief Complaint  Patient presents with  . Testicle Pain    HISTORY OF PRESENT ILLNESS:   85 y.o. male with a known  history of essential hypertension, MI, CVA, dementia presented to the hospital with testicular pain.  Patient lives with his wife and has around-the-clock sitters.  According to the history obtained by the ED nurse patient apparently had a fever.  He is confused and unable to provide any review of systems.  VITAL SIGNS:  Blood pressure (!) 159/65, pulse (!) 58, temperature 98.6 F (37 C), temperature source Axillary, resp. rate 18, height 5\' 8"  (1.727 m), weight 77.1 kg, SpO2 93 %.  I/O:    Intake/Output Summary (Last 24 hours) at 03/29/2019 1006 Last data filed at 03/29/2019 0301 Gross per 24 hour  Intake 762.33 ml  Output 100 ml  Net 662.33 ml    PHYSICAL EXAMINATION:  GENERAL:  85 y.o.-year-old patient lying in the bed with no acute distress.  EYES: Pupils equal, round, reactive to light and accommodation. No scleral icterus. Extraocular muscles intact.  HEENT: Head atraumatic, normocephalic. Oropharynx and nasopharynx clear.  NECK:  Supple, no jugular venous distention. No thyroid enlargement, no tenderness.  LUNGS: Normal breath sounds bilaterally, no wheezing, rales,rhonchi or  crepitation. No use of accessory muscles of respiration.  CARDIOVASCULAR: S1, S2 normal. No murmurs, rubs, or gallops.  ABDOMEN: Soft, non-tender, non-distended. Bowel sounds present. No organomegaly or mass.  EXTREMITIES: No pedal edema, cyanosis, or clubbing.  NEUROLOGIC: Cranial nerves II through XII are intact. Muscle strength 5/5 in all extremities. Sensation intact. Gait not checked.  PSYCHIATRIC: The patient is alert and oriented x 3.  SKIN: No obvious rash, lesion, or ulcer.   DATA REVIEW:   CBC Recent Labs  Lab 03/29/19 0517  WBC 6.4  HGB 13.2  HCT 39.0  PLT 158    Chemistries  Recent Labs  Lab 03/28/19 1023 03/29/19 0517  NA 136 139  K 4.5 3.6  CL 102 107  CO2 24 23  GLUCOSE 170* 145*  BUN 10 9  CREATININE 0.85 0.80  CALCIUM 8.6* 8.5*  AST 26  --   ALT 27  --   ALKPHOS 61  --   BILITOT 1.2  --     Cardiac Enzymes Recent Labs  Lab 03/28/19 1023  TROPONINI <0.03    Microbiology Results  Results for orders placed or performed during the hospital encounter of 03/28/19  Blood Culture (routine x 2)     Status: None (Preliminary result)   Collection Time: 03/28/19 10:20 AM   Specimen: BLOOD  Result Value Ref Range Status   Specimen Description BLOOD BLOOD RIGHT WRIST  Final   Special Requests   Final    BOTTLES DRAWN AEROBIC AND ANAEROBIC Blood Culture adequate volume   Culture   Final    NO GROWTH < 24 HOURS Performed at [facility], [address] [place], [place] [address]    Report Status PENDING  Incomplete  Blood Culture (routine x 2)     Status: None (Preliminary result)   Collection Time: 03/28/19 10:30 AM   Specimen: BLOOD  Result Value Ref Range Status   Specimen Description BLOOD BLOOD LEFT HAND  Final   Special Requests   Final    BOTTLES DRAWN AEROBIC AND ANAEROBIC Blood Culture results may not be optimal due to an inadequate volume of blood received in culture bottles   Culture   Final    NO GROWTH < 24 HOURS Performed  at [facility], [address], [place],  [address]    Report Status PENDING  Incomplete  SARS Coronavirus 2 (CEPHEID- Performed in [facility] hospital lab), Hosp Order     Status: None   Collection Time: 03/28/19 10:34 AM   Specimen: Nasopharyngeal  Result Value Ref Range Status   SARS Coronavirus 2 NEGATIVE NEGATIVE Final    Comment: (NOTE) If result is NEGATIVE SARS-CoV-2 target nucleic acids are NOT DETECTED. The SARS-CoV-2 RNA is generally detectable in upper and lower  respiratory specimens during the acute phase of infection. The lowest  concentration of SARS-CoV-2 viral copies this assay can detect is 250  copies / mL. A negative result does not preclude SARS-CoV-2 infection  and should not be used as the sole basis for treatment or other  patient management decisions.  A negative result may occur with  improper specimen collection / handling, submission of specimen other  than nasopharyngeal swab, presence of viral mutation(s) within the  areas targeted by this assay, and inadequate number of viral copies  (<250 copies / mL). A negative result must be combined with clinical  observations, patient history, and epidemiological information. If result is POSITIVE SARS-CoV-2 target nucleic acids are DETECTED. The SARS-CoV-2 RNA is generally detectable in upper and lower  respiratory specimens dur ing the acute phase of infection.  Positive  results are indicative of active infection with SARS-CoV-2.  Clinical  correlation with patient history and other diagnostic information is  necessary to determine patient infection status.  Positive results do  not rule out bacterial infection or co-infection with other viruses. If result is PRESUMPTIVE POSTIVE SARS-CoV-2 nucleic acids MAY BE PRESENT.   A presumptive positive result was obtained on the submitted specimen  and confirmed on repeat testing.  While 2019 novel coronavirus  (SARS-CoV-2) nucleic acids may be  present in the submitted sample  additional confirmatory testing may be necessary for epidemiological  and / or clinical management purposes  to differentiate between  SARS-CoV-2 and other Sarbecovirus currently known to infect humans.  If clinically indicated additional testing with an alternate test  methodology [phone]) is advised. The SARS-CoV-2 RNA is generally  detectable in upper and lower respiratory sp ecimens during the acute  phase of infection. The expected result is Negative. Fact Sheet for Patients:  StrictlyIdeas.no Fact Sheet for Healthcare Providers: BankingDealers.co.za This test is not yet approved or cleared by the [place] FDA and has been authorized for detection and/or diagnosis of SARS-CoV-2 by FDA under an Emergency Use Authorization (EUA).  This EUA will remain in effect (meaning this test can be used) for the duration of the COVID-19 declaration under Section 564(b)(1) of the Act, 21 U.S.C. section 360bbb-3(b)(1), unless the authorization is terminated or revoked sooner. Performed at [facility], [address] [place], [place] [address]     RADIOLOGY:  Ct Head Wo Contrast  Result Date: 03/28/2019 CLINICAL DATA:  Altered LOC EXAM: CT HEAD WITHOUT CONTRAST TECHNIQUE: Contiguous axial images were obtained from the base of the skull through the vertex without intravenous contrast. COMPARISON:  MRI 10/10/2018, CT brain 10/09/2018, 05/23/2018 FINDINGS: Brain: Motion degradation limits evaluation of the brain parenchyma. Chronic bilateral occipital infarcts. Atrophy and prominent small vessel ischemic changes of the white matter. Chronic left thalamic infarct. Stable ventricle size. Vascular: No hyperdense vessels.  Carotid vascular calcification Skull: Normal. Negative for fracture or focal lesion. Sinuses/Orbits: Mucosal thickening in the maxillary, sphenoid and ethmoid sinuses Other: None IMPRESSION:  1. The study is degraded by patient motion. No gross acute intracranial abnormality. 2. Atrophy and small vessel ischemic changes of the white matter. Chronic left PCA and right  occipital infarcts. Electronically Signed   By: Donavan Foil M.D.   On: 03/28/2019 16:06   Dg Chest Port 1 View  Result Date: 03/28/2019 CLINICAL DATA:  Sepsis. EXAM: PORTABLE CHEST 1 VIEW COMPARISON:  04/15/2018. FINDINGS: Normal sized heart. Stable post CABG changes. Poor inspiration with minimal right basilar atelectasis. Minimal bilateral shoulder degenerative changes. IMPRESSION: Poor inspiration with minimal right basilar atelectasis. Electronically Signed   By: Claudie Revering M.D.   On: 03/28/2019 10:57   US Scrotum W/doppler  Result Date: 03/28/2019 CLINICAL DATA:  Initial evaluation for acute testicular pain. EXAM: SCROTAL ULTRASOUND DOPPLER ULTRASOUND OF THE TESTICLES TECHNIQUE: Complete ultrasound examination of the testicles, epididymis, and other scrotal structures was performed. Color and spectral Doppler ultrasound were also utilized to evaluate blood flow to the testicles. COMPARISON:  None available. FINDINGS: Right testicle Measurements: 3.7 x 2.4 x 2.9 cm. No mass lesion. Few punctate microcalcifications noted without frank microlithiasis. Left testicle Measurements: 4.2 x 1.8 x 3.1 cm. No mass lesion. Few punctate microcalcifications noted without frank microlithiasis. Right epididymis:  Normal in size and appearance. Left epididymis: Normal in size and appearance. Incidental note made of a 2-3 mm simple cyst at the epididymal head, most consistent with a small benign epididymal cyst/spermatocele. Finding felt to be incidental in nature and most likely inconsequential. Hydrocele:  Small bilateral hydroceles. Varicocele:  None visualized. Pulsed Doppler interrogation of both testes demonstrates normal low resistance arterial and venous waveforms bilaterally. IMPRESSION: 1. Small bilateral hydroceles. 2. Otherwise  negative scrotal ultrasound. No evidence for torsion or other acute abnormality. Electronically Signed   By: Jeannine Boga M.D.   On: 03/28/2019 13:59    EKG:   Orders placed or performed during the hospital encounter of 03/28/19  . ED EKG 12-Lead  . ED EKG 12-Lead      Management plans discussed with the patient, family and they are in agreement.  CODE STATUS:     Code Status Orders  (From admission, onward)         Start     Ordered   03/28/19 1714  Full code  Continuous     03/28/19 1713        Code Status History    Date Active Date Inactive Code Status Order ID Comments User Context   10/10/2018 0254 10/12/2018 1951 Full Code [id number]  Arta Silence, MD Inpatient   05/22/2018 1004 05/31/2018 2014 Full Code [id number]  Katha Cabal, MD Inpatient   04/15/2018 2120 04/22/2018 2032 Full Code [id number]  Vaughan Basta, MD Inpatient   02/27/2018 1544 04/01/2018 1415 Full Code [id number]  Bary Leriche, PA-C Inpatient   02/27/2018 1544 02/27/2018 1544 Full Code [id number]  Bary Leriche, PA-C Inpatient   02/23/2018 0018 02/27/2018 1528 Full Code [id number]  Lance Coon, MD Inpatient   Advance Care Planning Activity    Advance Directive Documentation     Most Recent Value  Type of Advance Directive  Healthcare Power of Attorney, Living will  Pre-existing out of facility DNR order (yellow form or pink MOST form)  -  "MOST" Form in Place?  -      TOTAL TIME TAKING CARE OF THIS PATIENT: 40 minutes.    Avel Peace Aarica Wax M.D on 03/29/2019 at 10:06 AM  Between 7am to 6pm - Pager - [phone]  After 6pm go to [email or web address] - password [email or web address] [facility]  Office  [phone]  CC: Primary care physician; Einar Pheasant, MD   Note: This dictation was  prepared with Dragon dictation along with smaller phrase technology. Any transcriptional errors that result from this process are unintentional.

## 2019-03-30 LAB — URINE CULTURE: Culture: NO GROWTH

## 2019-04-01 ENCOUNTER — Telehealth: Payer: Self-pay | Admitting: Internal Medicine

## 2019-04-01 NOTE — Telephone Encounter (Signed)
Transition Care Management Follow-up Telephone Call  How have you been since you were released from the hospital? Patient DPR stated he is doing ok just very weak.   Do you understand why you were in the hospital? yes   Do you understand the discharge instrcutions? yes  Items Reviewed:  Medications reviewed: yes, started keflex tolerating well.  Allergies reviewed: yes  Dietary changes reviewed: yes  Referrals reviewed: yes, Home health.   Functional Questionnaire:   Activities of Daily Living (ADLs):   He states they are independent in the following: feeding States they require assistance with the following: ambulation, bathing and hygiene, continence, grooming, toileting and dressing   Any transportation issues/concerns?: no   Any patient concerns? no   Confirmed importance and date/time of follow-up visits scheduled: yes   Confirmed with patient if condition begins to worsen call PCP or go to the ER.  Patient was given the Call-a-Nurse line [phone]: yes

## 2019-04-01 NOTE — Telephone Encounter (Signed)
Has f/u appt scheduled 04/02/19

## 2019-04-02 ENCOUNTER — Other Ambulatory Visit: Payer: Self-pay

## 2019-04-02 ENCOUNTER — Encounter: Payer: Self-pay | Admitting: Internal Medicine

## 2019-04-02 ENCOUNTER — Ambulatory Visit (INDEPENDENT_AMBULATORY_CARE_PROVIDER_SITE_OTHER): Payer: PPO | Admitting: Internal Medicine

## 2019-04-02 DIAGNOSIS — E119 Type 2 diabetes mellitus without complications: Secondary | ICD-10-CM | POA: Diagnosis not present

## 2019-04-02 DIAGNOSIS — R569 Unspecified convulsions: Secondary | ICD-10-CM | POA: Diagnosis not present

## 2019-04-02 DIAGNOSIS — E7800 Pure hypercholesterolemia, unspecified: Secondary | ICD-10-CM

## 2019-04-02 DIAGNOSIS — I10 Essential (primary) hypertension: Secondary | ICD-10-CM

## 2019-04-02 DIAGNOSIS — I2510 Atherosclerotic heart disease of native coronary artery without angina pectoris: Secondary | ICD-10-CM | POA: Diagnosis not present

## 2019-04-02 DIAGNOSIS — I7121 Aneurysm of the ascending aorta, without rupture: Secondary | ICD-10-CM

## 2019-04-02 DIAGNOSIS — A419 Sepsis, unspecified organism: Secondary | ICD-10-CM

## 2019-04-02 DIAGNOSIS — I712 Thoracic aortic aneurysm, without rupture: Secondary | ICD-10-CM

## 2019-04-02 LAB — CULTURE, BLOOD (ROUTINE X 2)
Culture: NO GROWTH
Culture: NO GROWTH
Special Requests: ADEQUATE

## 2019-04-02 NOTE — Assessment & Plan Note (Signed)
Blood pressure has been doing well.  Follow pressures.  Follow metabolic panel.   

## 2019-04-02 NOTE — Assessment & Plan Note (Signed)
Followed by cardiology. Stable.   

## 2019-04-02 NOTE — Assessment & Plan Note (Signed)
Follow met b and a1c.  

## 2019-04-02 NOTE — Progress Notes (Signed)
Patient ID: Kyle Richardson, male   DOB: [birth year], 85 y.o.   MRN: [id number]   Virtual Visit via telephoneNote  This visit type was conducted due to national recommendations for restrictions regarding the COVID-19 pandemic (e.g. social distancing).  This format is felt to be most appropriate for this patient at this time.  All issues noted in this document were discussed and addressed.  No physical exam was performed (except for noted visual exam findings with Video Visits).   I connected with [name] today by telephone and verified that I am speaking with the correct person using two identifiers. Location patient: home Location provider: work or home office Persons participating in the telephone visit: patient, provider and [name])  I discussed the limitations, risks, security and privacy concerns of performing an evaluation and management service by telephone and the availability of in person appointments. The patient expressed understanding and agreed to proceed.     Reason for visit: hospital follow up.   HPI: He was admitted 03/28/19.  Presented to ER with testicular pain. ER notes - reported fever and acute encephalopathy.  Wife denies increased confusion.  Had CT scan head - no acute intracranial abnormality.  Was started on ceftriaxone.  Testicular ultrasound - no acute abnormality. Pain felt to be possible epididymitis or mild orchitis.  Converted to keflex to complete abx course.  Tolerating abx.  Wife reports he is doing much better.  Eating well.  No nausea or vomiting.  Getting up and moving around better.  He reports he feels much better.  Bowels moving.      ROS: See pertinent positives and negatives per HPI.  Past Medical History:  Diagnosis Date  . Abdominal fibromatosis   . 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  s/p PTCA of the mid RCA 1/96, also  s/p  CABG x 4 7/96  . Complication of anesthesia    confusion and  hallucinations for several days after receiving Versed  . Degenerative disc disease   . Dementia ([place])   . Diabetes mellitus ([place])   . Glaucoma   . Hypercholesterolemia   . Hypertension   . Migraine headache    history of  . Myocardial infarction ([place]) 1996  . Osteoarthritis   . Sleep apnea   . Stroke [facility]) 02/22/2017   affected left side     Past Surgical History:  Procedure Laterality Date  . APPENDECTOMY    . CAROTID ANGIOGRAPHY Bilateral 04/19/2018   Procedure: CAROTID ANGIOGRAPHY;  Surgeon: Algernon Huxley, MD;  Location: [place] [facility];  Service: Cardiovascular;  Laterality: Bilateral;  . CAROTID PTA/STENT INTERVENTION Right 05/22/2018   Procedure: CAROTID PTA/STENT INTERVENTION;  Surgeon: Katha Cabal, MD;  Location: [place] [facility];  Service: Cardiovascular;  Laterality: Right;  . CATARACT EXTRACTION W/ INTRAOCULAR LENS  IMPLANT, BILATERAL    . CHOLECYSTECTOMY     Removed  . CORONARY ARTERY BYPASS GRAFT  7/96   x4  . HEMORRHOID SURGERY    . HERNIA REPAIR    . TONSILLECTOMY    . UPP and tonsillectomy  1/86    Family History  Problem Relation Age of Onset  . Heart disease Mother        died age 48  . Heart disease Father        and other vascular disease  . Diabetes Other        four siblings  . Heart disease Brother  s/p CABG  . Colon cancer Neg Hx   . Prostate cancer Neg Hx     SOCIAL HX: reviewed.     Current Outpatient Medications:  .  albuterol (PROVENTIL HFA;VENTOLIN HFA) 108 (90 Base) MCG/ACT inhaler, Inhale 2 puffs into the lungs every 6 (six) hours as needed for wheezing or shortness of breath. (Patient not taking: Reported on 03/28/2019), Disp: 1 Inhaler, Rfl: 0 .  ALPRAZolam (XANAX) 0.25 MG tablet, Take 0.25 mg by mouth 2 (two) times daily as needed for anxiety or sleep. , Disp: , Rfl:  .  apixaban (ELIQUIS) 5 MG TABS tablet, Take 1 tablet (5 mg total) by mouth 2 (two) times daily., Disp: 30 tablet, Rfl: 1 .  aspirin EC 81 MG EC  tablet, Take 1 tablet (81 mg total) by mouth daily., Disp: 180 tablet, Rfl: 2 .  atorvastatin (LIPITOR) 40 MG tablet, Take 1 tablet (40 mg total) by mouth at bedtime., Disp: 90 tablet, Rfl: 1 .  cephALEXin (KEFLEX) 500 MG capsule, Take 1 capsule (500 mg total) by mouth 4 (four) times daily., Disp: 25 capsule, Rfl: 0 .  divalproex (DEPAKOTE ER) 250 MG 24 hr tablet, TAKE 3 TABLETS BY MOUTH AT BEDTIME. (Patient taking differently: Take 750 mg by mouth at bedtime. ), Disp: 90 tablet, Rfl: 1 .  fluticasone (FLONASE) 50 MCG/ACT nasal spray, Place 2 sprays into both nostrils daily. (Patient not taking: Reported on 03/28/2019), Disp: 16 g, Rfl: 2 .  latanoprost (XALATAN) 0.005 % ophthalmic solution, Place 1 drop into both eyes at bedtime., Disp: , Rfl:  .  loratadine (CLARITIN) 10 MG tablet, Take 10 mg by mouth daily., Disp: , Rfl:  .  MELATONIN ER PO, Take 1 tablet by mouth at bedtime. , Disp: , Rfl:  .  metoprolol succinate (TOPROL-XL) 25 MG 24 hr tablet, Take 0.5 tablets (12.5 mg total) by mouth daily. (Patient not taking: Reported on 03/28/2019), Disp: 30 tablet, Rfl: 1 .  nitroGLYCERIN (NITROSTAT) 0.4 MG SL tablet, Place 0.4 mg under the tongue every 5 (five) minutes as needed for chest pain. , Disp: , Rfl:  .  tiZANidine (ZANAFLEX) 4 MG tablet, Take 1 tablet (4 mg total) by mouth daily as needed for muscle spasms., Disp: 30 tablet, Rfl: 1 .  traZODone (DESYREL) 50 MG tablet, Take 50 mg by mouth at bedtime. , Disp: , Rfl:   EXAM:  GENERAL: alert.  Sounds to be in no acute distress.    PSYCH/NEURO: pleasant and cooperative, no obvious depression or anxiety, speech and thought processing grossly intact  ASSESSMENT AND PLAN:  Discussed the following assessment and plan:  Ascending aortic aneurysm (HCC) Has been followed by vascular surgery.   CAD (coronary artery disease) Followed by cardiology.  Stable.    Diabetes mellitus type 2 in nonobese [facility]) Follow met b and a1c.    Hypercholesterolemia On lipitor.  Follow lipid panel and liver function tests.   Hypertension Blood pressure has been doing well.  Follow pressures.  Follow metabolic panel.   Sepsis ([place]) Admitted and diagnosed with sepsis.  Treated with IV rocephin.  On keflex now.  W/up as outlined.  Doing better.  Follow.      I discussed the assessment and treatment plan with the patient. The patient was provided an opportunity to ask questions and all were answered. The patient agreed with the plan and demonstrated an understanding of the instructions.   The patient was advised to call back or seek an in-person evaluation if the  symptoms worsen or if the condition fails to improve as anticipated.  I provided 15 minutes of non-face-to-face time during this encounter.   Einar Pheasant, MD

## 2019-04-02 NOTE — Assessment & Plan Note (Signed)
Has been followed by vascular surgery.  

## 2019-04-02 NOTE — Assessment & Plan Note (Signed)
Admitted and diagnosed with sepsis.  Treated with IV rocephin.  On keflex now.  W/up as outlined.  Doing better.  Follow.

## 2019-04-02 NOTE — Assessment & Plan Note (Signed)
On lipitor.  Follow lipid panel and liver function tests.   

## 2019-04-03 DIAGNOSIS — Z8673 Personal history of transient ischemic attack (TIA), and cerebral infarction without residual deficits: Secondary | ICD-10-CM | POA: Diagnosis not present

## 2019-04-03 DIAGNOSIS — E7800 Pure hypercholesterolemia, unspecified: Secondary | ICD-10-CM | POA: Diagnosis not present

## 2019-04-03 DIAGNOSIS — I480 Paroxysmal atrial fibrillation: Secondary | ICD-10-CM | POA: Diagnosis not present

## 2019-04-03 DIAGNOSIS — I2510 Atherosclerotic heart disease of native coronary artery without angina pectoris: Secondary | ICD-10-CM | POA: Diagnosis not present

## 2019-04-03 DIAGNOSIS — Z993 Dependence on wheelchair: Secondary | ICD-10-CM | POA: Diagnosis not present

## 2019-04-03 DIAGNOSIS — E119 Type 2 diabetes mellitus without complications: Secondary | ICD-10-CM | POA: Diagnosis not present

## 2019-04-03 DIAGNOSIS — G4730 Sleep apnea, unspecified: Secondary | ICD-10-CM | POA: Diagnosis not present

## 2019-04-03 DIAGNOSIS — Z7982 Long term (current) use of aspirin: Secondary | ICD-10-CM | POA: Diagnosis not present

## 2019-04-03 DIAGNOSIS — I10 Essential (primary) hypertension: Secondary | ICD-10-CM | POA: Diagnosis not present

## 2019-04-03 DIAGNOSIS — N401 Enlarged prostate with lower urinary tract symptoms: Secondary | ICD-10-CM | POA: Diagnosis not present

## 2019-04-03 DIAGNOSIS — Z9181 History of falling: Secondary | ICD-10-CM | POA: Diagnosis not present

## 2019-04-03 DIAGNOSIS — N390 Urinary tract infection, site not specified: Secondary | ICD-10-CM | POA: Diagnosis not present

## 2019-04-03 DIAGNOSIS — Z792 Long term (current) use of antibiotics: Secondary | ICD-10-CM | POA: Diagnosis not present

## 2019-04-03 DIAGNOSIS — M1990 Unspecified osteoarthritis, unspecified site: Secondary | ICD-10-CM | POA: Diagnosis not present

## 2019-04-03 DIAGNOSIS — F0390 Unspecified dementia without behavioral disturbance: Secondary | ICD-10-CM | POA: Diagnosis not present

## 2019-04-03 DIAGNOSIS — G43909 Migraine, unspecified, not intractable, without status migrainosus: Secondary | ICD-10-CM | POA: Diagnosis not present

## 2019-04-03 DIAGNOSIS — N39498 Other specified urinary incontinence: Secondary | ICD-10-CM | POA: Diagnosis not present

## 2019-04-03 DIAGNOSIS — M5030 Other cervical disc degeneration, unspecified cervical region: Secondary | ICD-10-CM | POA: Diagnosis not present

## 2019-04-03 DIAGNOSIS — Z7901 Long term (current) use of anticoagulants: Secondary | ICD-10-CM | POA: Diagnosis not present

## 2019-04-03 DIAGNOSIS — I252 Old myocardial infarction: Secondary | ICD-10-CM | POA: Diagnosis not present

## 2019-04-09 ENCOUNTER — Telehealth: Payer: Self-pay | Admitting: Internal Medicine

## 2019-04-09 ENCOUNTER — Other Ambulatory Visit: Payer: Self-pay

## 2019-04-09 ENCOUNTER — Other Ambulatory Visit: Payer: Self-pay | Admitting: Internal Medicine

## 2019-04-09 MED ORDER — TIZANIDINE HCL 4 MG PO TABS: 4.0000 mg | ORAL_TABLET | Freq: Every day | ORAL | 5 refills | Status: DC | PRN

## 2019-04-09 MED ORDER — DIVALPROEX SODIUM ER 250 MG PO TB24: 750.0000 mg | ORAL_TABLET | Freq: Every day | ORAL | 5 refills | Status: DC

## 2019-04-09 NOTE — Telephone Encounter (Signed)
rx refilled.

## 2019-04-09 NOTE — Telephone Encounter (Signed)
Copied from [place] [id number]. Topic: Quick Communication - Rx Refill/Question >> Apr 09, 2019 12:06 PM Keene Breath wrote: Medication: tiZANidine (ZANAFLEX) 4 MG tablet and divalproex (DEPAKOTE ER) 250 MG 24 hr tablet  Refill request, Patient is out of medication  Preferred Pharmacy (with phone number or street name): [facility] [address] [place], [place] - [place] [phone] (Phone) [phone] (Fax)

## 2019-04-11 ENCOUNTER — Other Ambulatory Visit: Payer: Self-pay | Admitting: Internal Medicine

## 2019-04-18 DIAGNOSIS — F0390 Unspecified dementia without behavioral disturbance: Secondary | ICD-10-CM | POA: Diagnosis not present

## 2019-04-18 DIAGNOSIS — I2510 Atherosclerotic heart disease of native coronary artery without angina pectoris: Secondary | ICD-10-CM | POA: Diagnosis not present

## 2019-04-18 DIAGNOSIS — Z993 Dependence on wheelchair: Secondary | ICD-10-CM

## 2019-04-18 DIAGNOSIS — N401 Enlarged prostate with lower urinary tract symptoms: Secondary | ICD-10-CM

## 2019-04-18 DIAGNOSIS — G4730 Sleep apnea, unspecified: Secondary | ICD-10-CM

## 2019-04-18 DIAGNOSIS — N390 Urinary tract infection, site not specified: Secondary | ICD-10-CM | POA: Diagnosis not present

## 2019-04-18 DIAGNOSIS — M1990 Unspecified osteoarthritis, unspecified site: Secondary | ICD-10-CM | POA: Diagnosis not present

## 2019-04-18 DIAGNOSIS — Z7982 Long term (current) use of aspirin: Secondary | ICD-10-CM

## 2019-04-18 DIAGNOSIS — M5030 Other cervical disc degeneration, unspecified cervical region: Secondary | ICD-10-CM | POA: Diagnosis not present

## 2019-04-18 DIAGNOSIS — I480 Paroxysmal atrial fibrillation: Secondary | ICD-10-CM | POA: Diagnosis not present

## 2019-04-18 DIAGNOSIS — G43909 Migraine, unspecified, not intractable, without status migrainosus: Secondary | ICD-10-CM

## 2019-04-18 DIAGNOSIS — N39498 Other specified urinary incontinence: Secondary | ICD-10-CM | POA: Diagnosis not present

## 2019-04-18 DIAGNOSIS — I10 Essential (primary) hypertension: Secondary | ICD-10-CM | POA: Diagnosis not present

## 2019-04-18 DIAGNOSIS — Z8673 Personal history of transient ischemic attack (TIA), and cerebral infarction without residual deficits: Secondary | ICD-10-CM

## 2019-04-18 DIAGNOSIS — Z792 Long term (current) use of antibiotics: Secondary | ICD-10-CM

## 2019-04-18 DIAGNOSIS — I252 Old myocardial infarction: Secondary | ICD-10-CM

## 2019-04-18 DIAGNOSIS — Z7901 Long term (current) use of anticoagulants: Secondary | ICD-10-CM

## 2019-04-18 DIAGNOSIS — E119 Type 2 diabetes mellitus without complications: Secondary | ICD-10-CM | POA: Diagnosis not present

## 2019-04-18 DIAGNOSIS — Z9181 History of falling: Secondary | ICD-10-CM

## 2019-04-18 DIAGNOSIS — E7800 Pure hypercholesterolemia, unspecified: Secondary | ICD-10-CM | POA: Diagnosis not present

## 2019-04-25 DIAGNOSIS — G4730 Sleep apnea, unspecified: Secondary | ICD-10-CM | POA: Diagnosis not present

## 2019-04-25 DIAGNOSIS — I2510 Atherosclerotic heart disease of native coronary artery without angina pectoris: Secondary | ICD-10-CM | POA: Diagnosis not present

## 2019-04-25 DIAGNOSIS — Z792 Long term (current) use of antibiotics: Secondary | ICD-10-CM | POA: Diagnosis not present

## 2019-04-25 DIAGNOSIS — I480 Paroxysmal atrial fibrillation: Secondary | ICD-10-CM | POA: Diagnosis not present

## 2019-04-25 DIAGNOSIS — Z8673 Personal history of transient ischemic attack (TIA), and cerebral infarction without residual deficits: Secondary | ICD-10-CM | POA: Diagnosis not present

## 2019-04-25 DIAGNOSIS — Z993 Dependence on wheelchair: Secondary | ICD-10-CM | POA: Diagnosis not present

## 2019-04-25 DIAGNOSIS — M1990 Unspecified osteoarthritis, unspecified site: Secondary | ICD-10-CM | POA: Diagnosis not present

## 2019-04-25 DIAGNOSIS — G43909 Migraine, unspecified, not intractable, without status migrainosus: Secondary | ICD-10-CM | POA: Diagnosis not present

## 2019-04-25 DIAGNOSIS — E7800 Pure hypercholesterolemia, unspecified: Secondary | ICD-10-CM | POA: Diagnosis not present

## 2019-04-25 DIAGNOSIS — Z9181 History of falling: Secondary | ICD-10-CM | POA: Diagnosis not present

## 2019-04-25 DIAGNOSIS — F0390 Unspecified dementia without behavioral disturbance: Secondary | ICD-10-CM | POA: Diagnosis not present

## 2019-04-25 DIAGNOSIS — M5030 Other cervical disc degeneration, unspecified cervical region: Secondary | ICD-10-CM | POA: Diagnosis not present

## 2019-04-25 DIAGNOSIS — N401 Enlarged prostate with lower urinary tract symptoms: Secondary | ICD-10-CM | POA: Diagnosis not present

## 2019-04-25 DIAGNOSIS — I252 Old myocardial infarction: Secondary | ICD-10-CM | POA: Diagnosis not present

## 2019-04-25 DIAGNOSIS — Z7901 Long term (current) use of anticoagulants: Secondary | ICD-10-CM | POA: Diagnosis not present

## 2019-04-25 DIAGNOSIS — I10 Essential (primary) hypertension: Secondary | ICD-10-CM | POA: Diagnosis not present

## 2019-04-25 DIAGNOSIS — N390 Urinary tract infection, site not specified: Secondary | ICD-10-CM | POA: Diagnosis not present

## 2019-04-25 DIAGNOSIS — E119 Type 2 diabetes mellitus without complications: Secondary | ICD-10-CM | POA: Diagnosis not present

## 2019-04-25 DIAGNOSIS — Z7982 Long term (current) use of aspirin: Secondary | ICD-10-CM | POA: Diagnosis not present

## 2019-04-25 DIAGNOSIS — N39498 Other specified urinary incontinence: Secondary | ICD-10-CM | POA: Diagnosis not present

## 2019-04-27 IMAGING — CT CT HEAD W/O CM
3 series · 15 of 47 positions shown, 18 images · non-contrast
Comparison: 04/15/2018

CLINICAL DATA: Confusion. Right arm weakness. Recent infarctions
right temporal lobe and left occipital lobe.

EXAM:
CT HEAD WITHOUT CONTRAST
TECHNIQUE: Contiguous axial images were obtained from the base of the skull
through the vertex without intravenous contrast.

[Series 2: head wo · axial · 0.46mm/px · z∈[-161,-26]mm · 9 of 33 slices shown, 12 images]
[im 3/33  brain]
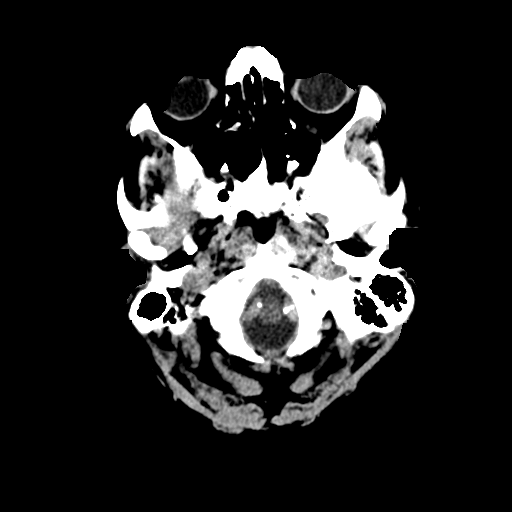
[im 3/33  bone]
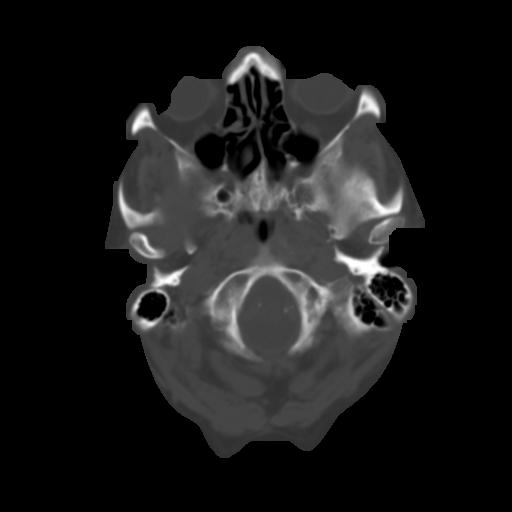
[im 6/33  brain]
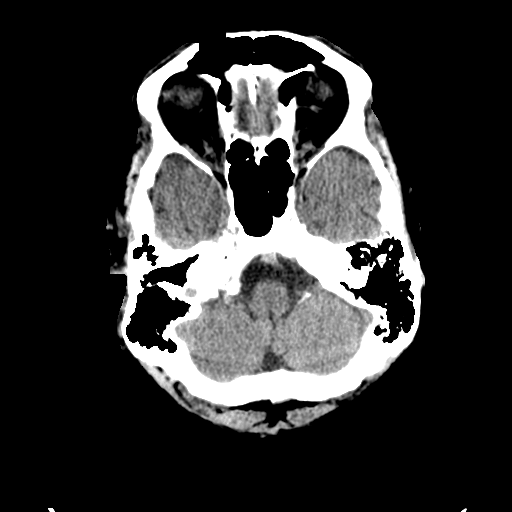
[im 9/33  brain]
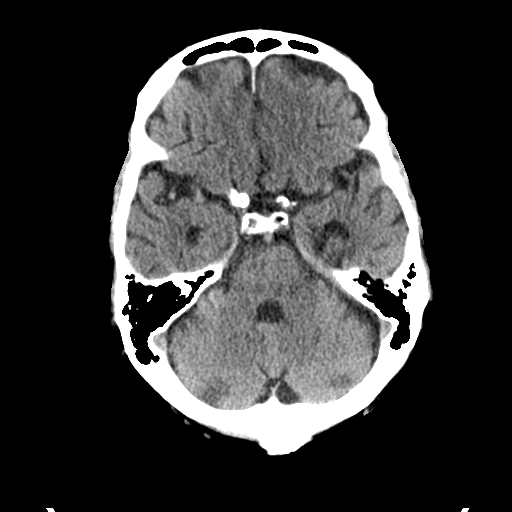
[im 13/33  brain]
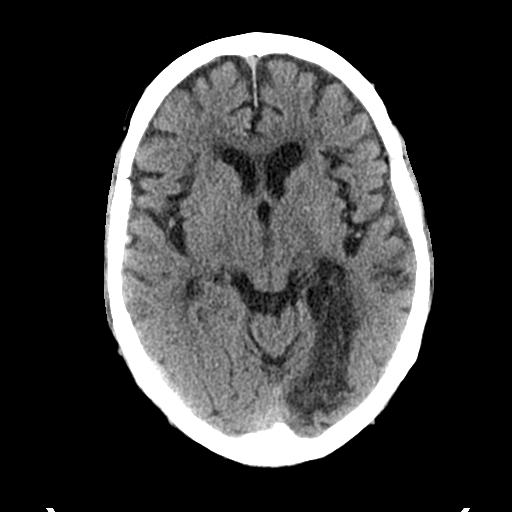
[im 17/33  brain]
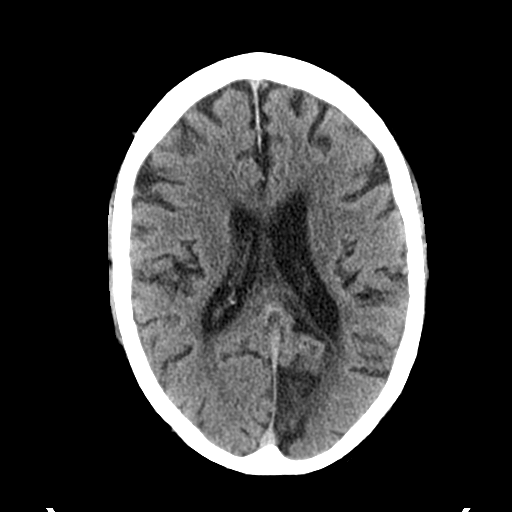
[im 17/33  bone]
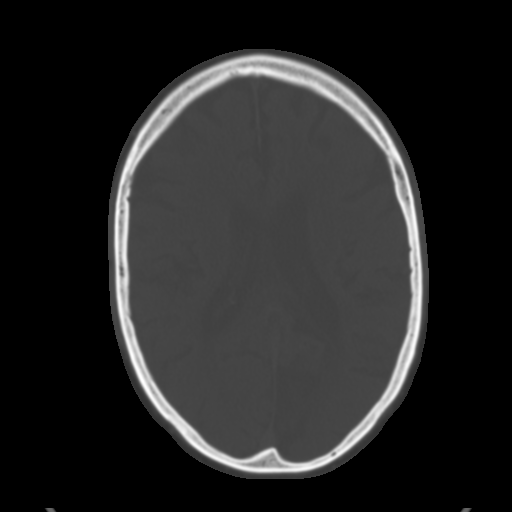
[im 20/33  brain]
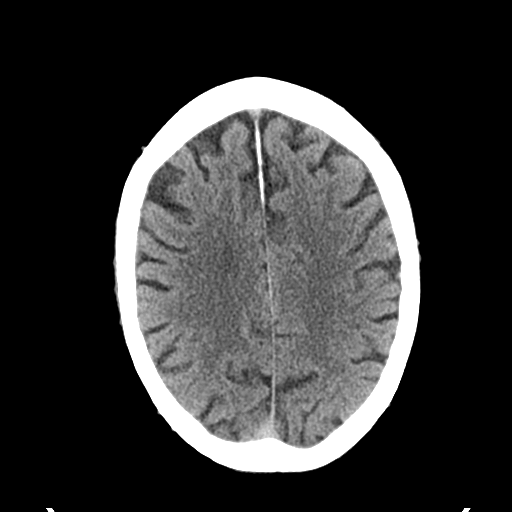
[im 24/33  brain]
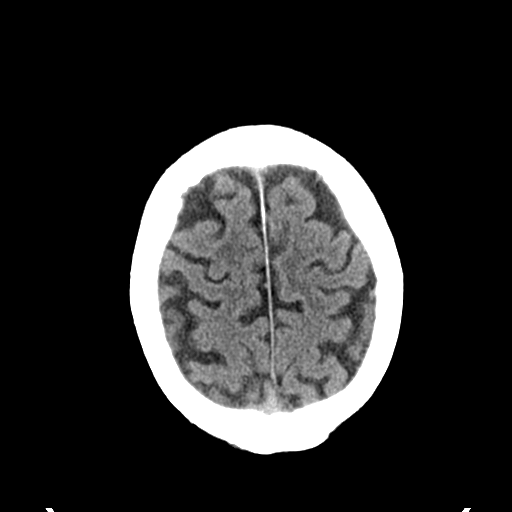
[im 27/33  brain]
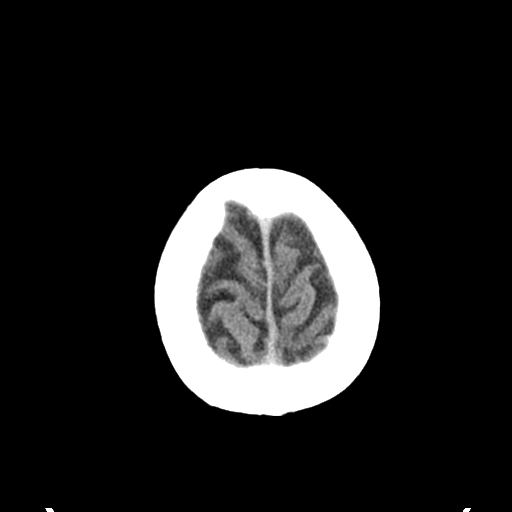
[im 30/33  brain]
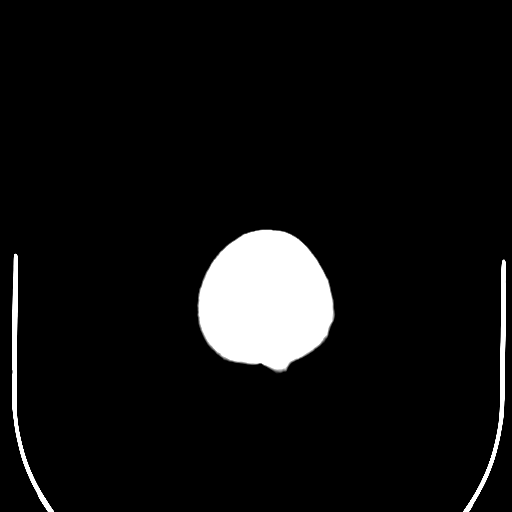
[im 30/33  bone]
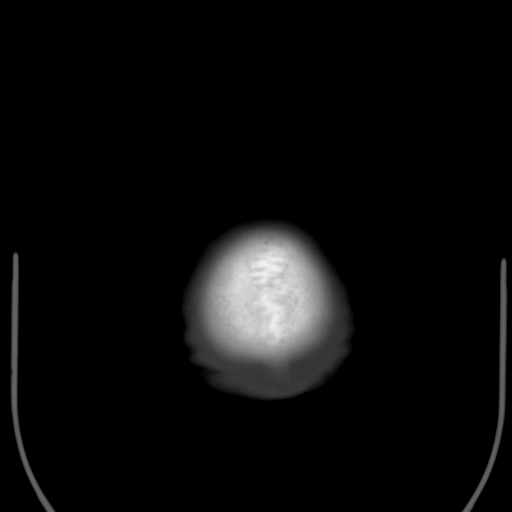

[Series 4: coronal soft tissue · coronal · 0.33mm/px · 3 of 70 slices shown]
[im 24/70  brain]
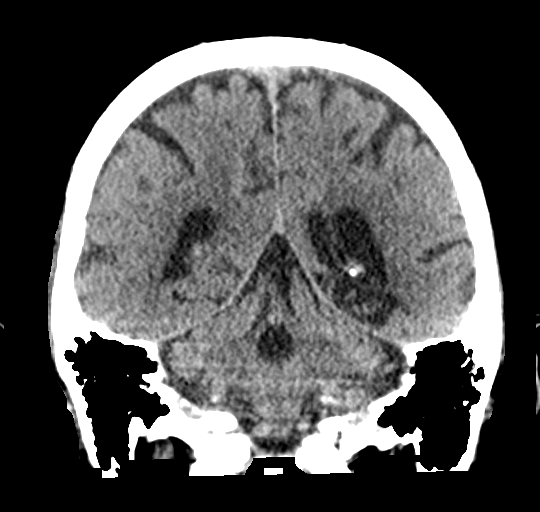
[im 31/70  brain]
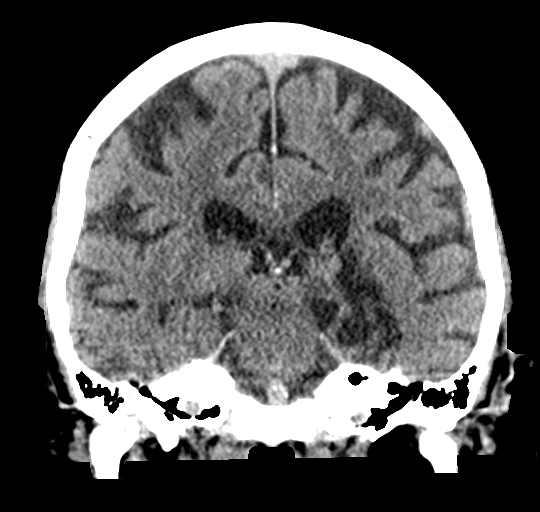
[im 39/70  brain]
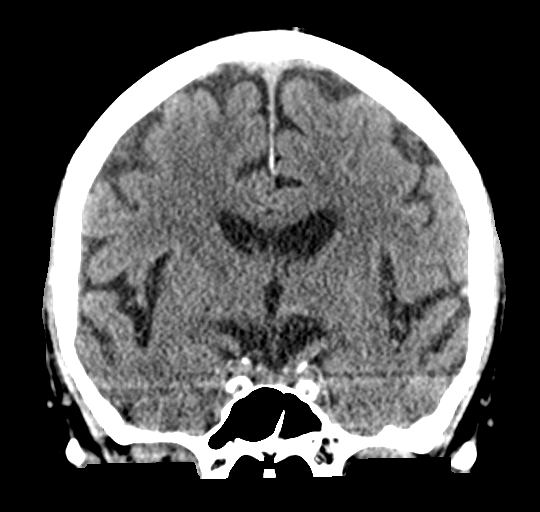

[Series 5: sagittal soft tissue · sagittal · 0.31mm/px · 3 of 61 slices shown]
[im 21/61  brain]
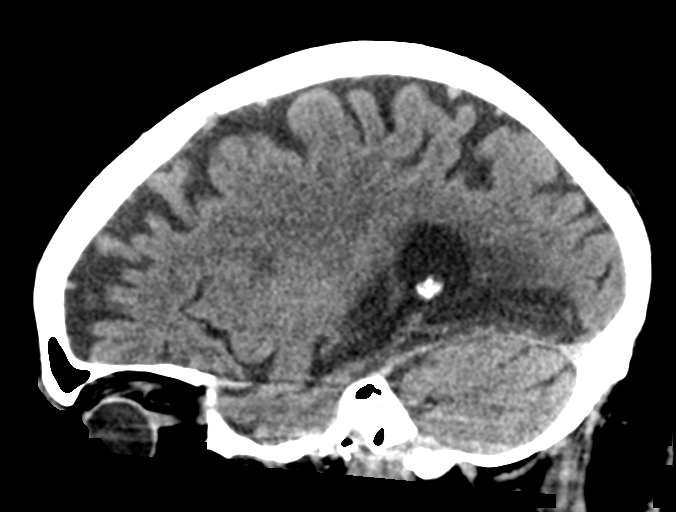
[im 31/61  brain]
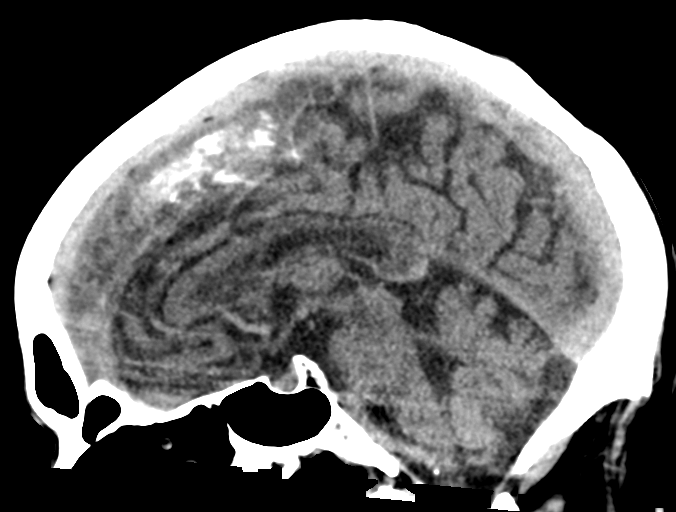
[im 41/61  brain]
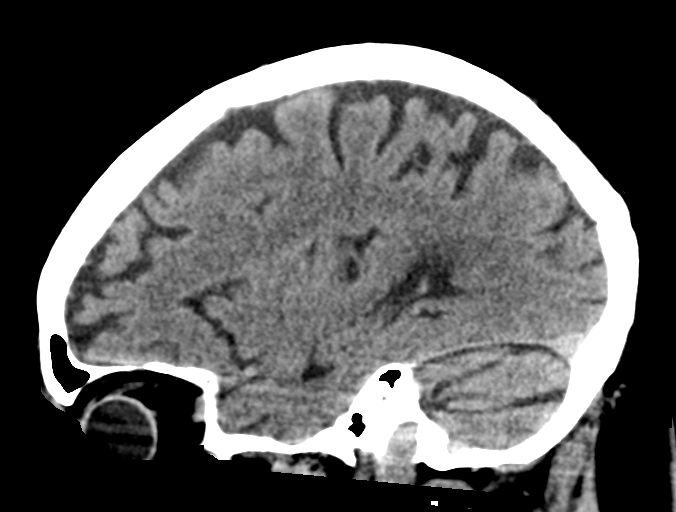

[15 of 47 positions shown; findings below may reference images not displayed]

FINDINGS: Brain: No focal brainstem or cerebellar finding. Cerebral
hemispheres show generalized atrophy. Late subacute to chronic
infarction in the left PCA territory affecting the posteromedial
temporal lobe and occipital lobe. Chronic small-vessel ischemic
changes elsewhere within the hemispheric white matter. No sign of
acute infarction, mass lesion, hemorrhage, hydrocephalus or
extra-axial collection.

Vascular: There is atherosclerotic calcification of the major
vessels at the base of the brain.

Skull: Negative

Sinuses/Orbits: Clear/normal

Other: None
IMPRESSION: No acute finding by CT. Late subacute to chronic left PCA territory
infarction. Atrophy and chronic small-vessel ischemic changes
elsewhere.

## 2019-04-28 DIAGNOSIS — M7581 Other shoulder lesions, right shoulder: Secondary | ICD-10-CM | POA: Diagnosis not present

## 2019-05-06 ENCOUNTER — Ambulatory Visit (INDEPENDENT_AMBULATORY_CARE_PROVIDER_SITE_OTHER): Payer: PPO | Admitting: Nurse Practitioner

## 2019-05-06 ENCOUNTER — Encounter (INDEPENDENT_AMBULATORY_CARE_PROVIDER_SITE_OTHER): Payer: Self-pay | Admitting: Nurse Practitioner

## 2019-05-06 ENCOUNTER — Ambulatory Visit (INDEPENDENT_AMBULATORY_CARE_PROVIDER_SITE_OTHER): Payer: PPO

## 2019-05-06 ENCOUNTER — Other Ambulatory Visit: Payer: Self-pay

## 2019-05-06 VITALS — BP 150/89 | HR 90 | Resp 16

## 2019-05-06 DIAGNOSIS — H903 Sensorineural hearing loss, bilateral: Secondary | ICD-10-CM | POA: Diagnosis not present

## 2019-05-06 DIAGNOSIS — I63239 Cerebral infarction due to unspecified occlusion or stenosis of unspecified carotid arteries: Secondary | ICD-10-CM

## 2019-05-06 DIAGNOSIS — Z7982 Long term (current) use of aspirin: Secondary | ICD-10-CM | POA: Diagnosis not present

## 2019-05-06 DIAGNOSIS — Z79899 Other long term (current) drug therapy: Secondary | ICD-10-CM | POA: Diagnosis not present

## 2019-05-06 DIAGNOSIS — I10 Essential (primary) hypertension: Secondary | ICD-10-CM

## 2019-05-06 DIAGNOSIS — E119 Type 2 diabetes mellitus without complications: Secondary | ICD-10-CM | POA: Diagnosis not present

## 2019-05-06 DIAGNOSIS — H6123 Impacted cerumen, bilateral: Secondary | ICD-10-CM | POA: Diagnosis not present

## 2019-05-06 NOTE — Progress Notes (Signed)
SUBJECTIVE:  Patient ID: Kyle Richardson, male    DOB: 31-Jan-1928, 85 y.o.   MRN: [id number] Chief Complaint  Patient presents with  . Follow-up    ultrasound follow up    HPI  Kyle Richardson is a 85 y.o. male The patient is seen for follow up evaluation of carotid stenosis. The carotid stenosis followed by ultrasound.   The patient denies amaurosis fugax. There is no recent history of TIA symptoms or focal motor deficits. There is no prior documented CVA.  The patient is taking enteric-coated aspirin 81 mg daily.  There is no history of migraine headaches. There is no history of seizures.  The patient has a history of coronary artery disease, no recent episodes of angina or shortness of breath. The patient denies PAD or claudication symptoms. There is a history of hyperlipidemia which is being treated with a statin.    Carotid Duplex done today shows patent carotid stents within the right common carotid artery and internal carotid artery with no hemodynamically significant stenosis.  Velocities in the left internal carotid artery are consistent with a 1 to 39% stenosis.  There is resistive flow noted throughout the carotid and vertebral arteries.  There is no significant change compared to the previous exam done on 10/28/2018.  Past Medical History:  Diagnosis Date  . Abdominal fibromatosis   . 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  s/p PTCA of the mid RCA 1/96, also  s/p  CABG x 4 7/96  . Complication of anesthesia    confusion and hallucinations for several days after receiving Versed  . Degenerative disc disease   . Dementia ([place])   . Diabetes mellitus ([place])   . Glaucoma   . Hypercholesterolemia   . Hypertension   . Migraine headache    history of  . Myocardial infarction ([place]) 1996  . Osteoarthritis   . Sleep apnea   . Stroke [facility]) 02/22/2017   affected left side     Past Surgical History:  Procedure Laterality Date  .  APPENDECTOMY    . CAROTID ANGIOGRAPHY Bilateral 04/19/2018   Procedure: CAROTID ANGIOGRAPHY;  Surgeon: Algernon Huxley, MD;  Location: [place] [facility];  Service: Cardiovascular;  Laterality: Bilateral;  . CAROTID PTA/STENT INTERVENTION Right 05/22/2018   Procedure: CAROTID PTA/STENT INTERVENTION;  Surgeon: Katha Cabal, MD;  Location: [place] [facility];  Service: Cardiovascular;  Laterality: Right;  . CATARACT EXTRACTION W/ INTRAOCULAR LENS  IMPLANT, BILATERAL    . CHOLECYSTECTOMY     Removed  . CORONARY ARTERY BYPASS GRAFT  7/96   x4  . HEMORRHOID SURGERY    . HERNIA REPAIR    . TONSILLECTOMY    . UPP and tonsillectomy  1/86    Social History   Socioeconomic History  . Marital status: Married    Spouse name: [name]  . Number of children: 2  . Years of education: come college  . Highest education level: Associate degree: occupational, Hotel manager, or vocational program  Occupational History  . Occupation: retired    Fish farm manager: [employer]  Social Needs  . Financial resource strain: Not hard at all  . Food insecurity    Worry: Never true    Inability: Never true  . Transportation needs    Medical: No    Non-medical: No  Tobacco Use  . Smoking status: Never Smoker  . Smokeless tobacco: Never Used  Substance and Sexual Activity  . Alcohol use: No  Alcohol/week: 0.0 standard drinks  . Drug use: No  . Sexual activity: Not Currently  Lifestyle  . Physical activity    Days per week: 0 days    Minutes per session: 0 min  . Stress: Not at all  Relationships  . Social Herbalist on phone: Never    Gets together: Three times a week    Attends religious service: More than 4 times per year    Active member of club or organization: No    Attends meetings of clubs or organizations: Never    Relationship status: Married  . Intimate partner violence    Fear of current or ex partner: Patient refused    Emotionally abused: Patient refused    Physically abused: Patient  refused    Forced sexual activity: Patient refused  Other Topics Concern  . Not on file  Social History Narrative  . Not on file    Family History  Problem Relation Age of Onset  . Heart disease Mother        died age 25  . Heart disease Father        and other vascular disease  . Diabetes Other        four siblings  . Heart disease Brother        s/p CABG  . Colon cancer Neg Hx   . Prostate cancer Neg Hx     Allergies  Allergen Reactions  . Fentanyl Other (See Comments)    Reaction: confusion  . Flomax [Tamsulosin Hcl] Other (See Comments)    Unknown  . Lamisil [Terbinafine] Other (See Comments)    Unknown  . Levofloxacin Other (See Comments)    Diplopia. NEVER put patient on levoquin!  . Lorazepam Other (See Comments)    Reaction: confusion/ hallucinations   . Metformin And Related Diarrhea  . Sertraline Other (See Comments)    Unknown  . Sulfa Antibiotics Other (See Comments)    Unknown  . Versed [Midazolam] Other (See Comments)    Hallucinations and confusion  . Clarithromycin Diarrhea  . Codeine Other (See Comments)    Unknown     Review of Systems   Review of Systems: Negative Unless Checked Constitutional: [] Weight loss  [] Fever  [] Chills Cardiac: [] Chest pain   []  Atrial Fibrillation  [] Palpitations   [] Shortness of breath when laying flat   [] Shortness of breath with exertion. [] Shortness of breath at rest Vascular:  [] Pain in legs with walking   [] Pain in legs with standing [] Pain in legs when laying flat   [] Claudication    [] Pain in feet when laying flat    [] History of DVT   [] Phlebitis   [] Swelling in legs   [] Varicose veins   [] Non-healing ulcers Pulmonary:   [] Uses home oxygen   [] Productive cough   [] Hemoptysis   [] Wheeze  [] COPD   [] Asthma Neurologic:  [] Dizziness   [] Seizures  [] Blackouts [x] History of stroke   [] History of TIA  [] Aphasia   [] Temporary Blindness   [] Weakness or numbness in arm   [] Weakness or numbness in leg Musculoskeletal:    [] Joint swelling   [] Joint pain   [] Low back pain  []  History of Knee Replacement [x] Arthritis [] back Surgeries  []  Spinal Stenosis    Hematologic:  [] Easy bruising  [] Easy bleeding   [] Hypercoagulable state   [] Anemic Gastrointestinal:  [] Diarrhea   [] Vomiting  [] Gastroesophageal reflux/heartburn   [] Difficulty swallowing. [] Abdominal pain Genitourinary:  [] Chronic kidney disease   [] Difficult urination  [] Anuric   []   Blood in urine [] Frequent urination  [] Burning with urination   [] Hematuria Skin:  [] Rashes   [] Ulcers [] Wounds Psychological:  [] History of anxiety   []  History of major depression  [x]  Memory Difficulties      OBJECTIVE:   Physical Exam  BP (!) 150/89 (BP Location: Right Arm)   Pulse 90   Resp 16   Gen: WD/WN, NAD Head: [place]/AT, No temporalis wasting.  Ear/Nose/Throat: Hearing grossly intact, nares w/o erythema or drainage Eyes: PER, EOMI, sclera nonicteric.  Neck: Supple, no masses.  No JVD.  Pulmonary:  Good air movement, no use of accessory muscles.  Cardiac: RRR Vascular:  Vessel Right Left  Radial Palpable Palpable   Gastrointestinal: soft, non-distended. No guarding/no peritoneal signs.  Musculoskeletal:Wheelchair bound  No deformity or atrophy.  Neurologic: Pain and light touch intact in extremities.  Symmetrical.  Speech is somewhat garbled Motor exam as listed above. Psychiatric: Judgment intact, Mood & affect appropriate for pt's clinical situation. Dermatologic: No Venous rashes. No Ulcers Noted.  No changes consistent with cellulitis. Lymph : No Cervical lymphadenopathy, no lichenification or skin changes of chronic lymphedema.       ASSESSMENT AND PLAN:  1. Carotid stenosis, symptomatic, with infarction [facility]) Carotid Duplex done today shows patent carotid stents within the right common carotid artery and internal carotid artery with no hemodynamically significant stenosis.  Velocities in the left internal carotid artery are consistent with a 1 to 39%  stenosis.  There is resistive flow noted throughout the carotid and vertebral arteries.  There is no significant change compared to the previous exam done on 10/28/2018.  Recommend:  Given the patient's asymptomatic subcritical stenosis no further invasive testing or surgery at this time.   Continue antiplatelet therapy as prescribed Continue management of CAD, HTN and Hyperlipidemia Healthy heart diet,  encouraged exercise at least 4 times per week Follow up in 12 months with duplex ultrasound and physical exam  - VAS US CAROTID; Future  2. Essential hypertension Continue antihypertensive medications as already ordered, these medications have been reviewed and there are no changes at this time.   3. Diabetes mellitus type 2 in nonobese [facility]) Continue hypoglycemic medications as already ordered, these medications have been reviewed and there are no changes at this time.  Hgb A1C to be monitored as already arranged by primary service    Current Outpatient Medications on File Prior to Visit  Medication Sig Dispense Refill  . ALPRAZolam (XANAX) 0.25 MG tablet Take 0.25 mg by mouth 2 (two) times daily as needed for anxiety or sleep.     Marland Kitchen apixaban (ELIQUIS) 5 MG TABS tablet Take 1 tablet (5 mg total) by mouth 2 (two) times daily. 30 tablet 1  . aspirin EC 81 MG EC tablet Take 1 tablet (81 mg total) by mouth daily. 180 tablet 2  . atorvastatin (LIPITOR) 40 MG tablet Take 1 tablet (40 mg total) by mouth at bedtime. 90 tablet 1  . divalproex (DEPAKOTE ER) 250 MG 24 hr tablet TAKE 3 TABLETS BY MOUTH EVERY NIGHT AT BEDTIME. (Patient taking differently: No sig reported) 90 tablet 5  . fluticasone (FLONASE) 50 MCG/ACT nasal spray Place 2 sprays into both nostrils daily. 16 g 2  . latanoprost (XALATAN) 0.005 % ophthalmic solution Place 1 drop into both eyes at bedtime.    Marland Kitchen loratadine (CLARITIN) 10 MG tablet Take 10 mg by mouth daily.    . metoprolol succinate (TOPROL-XL) 25 MG 24 hr tablet Take  0.5 tablets (12.5 mg total)  by mouth daily. 30 tablet 1  . nitroGLYCERIN (NITROSTAT) 0.4 MG SL tablet Place 0.4 mg under the tongue every 5 (five) minutes as needed for chest pain.     Marland Kitchen tiZANidine (ZANAFLEX) 4 MG tablet Take 1 tablet (4 mg total) by mouth daily as needed for muscle spasms. 30 tablet 5  . traZODone (DESYREL) 50 MG tablet Take 50 mg by mouth at bedtime.     Marland Kitchen albuterol (PROVENTIL HFA;VENTOLIN HFA) 108 (90 Base) MCG/ACT inhaler Inhale 2 puffs into the lungs every 6 (six) hours as needed for wheezing or shortness of breath. (Patient not taking: Reported on 03/28/2019) 1 Inhaler 0  . cephALEXin (KEFLEX) 500 MG capsule Take 1 capsule (500 mg total) by mouth 4 (four) times daily. (Patient not taking: Reported on 05/06/2019) 25 capsule 0  . MELATONIN ER PO Take 1 tablet by mouth at bedtime.      No current facility-administered medications on file prior to visit.     There are no Patient Instructions on file for this visit. Return in about 1 year (around 05/05/2020) for Carotid stenosis.   Kris Hartmann, NP  This note was completed with Sales executive.  Any errors are purely unintentional.

## 2019-05-13 DIAGNOSIS — E119 Type 2 diabetes mellitus without complications: Secondary | ICD-10-CM | POA: Diagnosis not present

## 2019-05-13 DIAGNOSIS — Z993 Dependence on wheelchair: Secondary | ICD-10-CM | POA: Diagnosis not present

## 2019-05-13 DIAGNOSIS — Z7901 Long term (current) use of anticoagulants: Secondary | ICD-10-CM | POA: Diagnosis not present

## 2019-05-13 DIAGNOSIS — I252 Old myocardial infarction: Secondary | ICD-10-CM | POA: Diagnosis not present

## 2019-05-13 DIAGNOSIS — Z792 Long term (current) use of antibiotics: Secondary | ICD-10-CM | POA: Diagnosis not present

## 2019-05-13 DIAGNOSIS — G43909 Migraine, unspecified, not intractable, without status migrainosus: Secondary | ICD-10-CM | POA: Diagnosis not present

## 2019-05-13 DIAGNOSIS — I10 Essential (primary) hypertension: Secondary | ICD-10-CM | POA: Diagnosis not present

## 2019-05-13 DIAGNOSIS — G4730 Sleep apnea, unspecified: Secondary | ICD-10-CM | POA: Diagnosis not present

## 2019-05-13 DIAGNOSIS — M5030 Other cervical disc degeneration, unspecified cervical region: Secondary | ICD-10-CM | POA: Diagnosis not present

## 2019-05-13 DIAGNOSIS — N39498 Other specified urinary incontinence: Secondary | ICD-10-CM | POA: Diagnosis not present

## 2019-05-13 DIAGNOSIS — I2510 Atherosclerotic heart disease of native coronary artery without angina pectoris: Secondary | ICD-10-CM | POA: Diagnosis not present

## 2019-05-13 DIAGNOSIS — N390 Urinary tract infection, site not specified: Secondary | ICD-10-CM | POA: Diagnosis not present

## 2019-05-13 DIAGNOSIS — Z8673 Personal history of transient ischemic attack (TIA), and cerebral infarction without residual deficits: Secondary | ICD-10-CM | POA: Diagnosis not present

## 2019-05-13 DIAGNOSIS — Z7982 Long term (current) use of aspirin: Secondary | ICD-10-CM | POA: Diagnosis not present

## 2019-05-13 DIAGNOSIS — F0390 Unspecified dementia without behavioral disturbance: Secondary | ICD-10-CM | POA: Diagnosis not present

## 2019-05-13 DIAGNOSIS — M1990 Unspecified osteoarthritis, unspecified site: Secondary | ICD-10-CM | POA: Diagnosis not present

## 2019-05-13 DIAGNOSIS — N401 Enlarged prostate with lower urinary tract symptoms: Secondary | ICD-10-CM | POA: Diagnosis not present

## 2019-05-13 DIAGNOSIS — I480 Paroxysmal atrial fibrillation: Secondary | ICD-10-CM | POA: Diagnosis not present

## 2019-05-13 DIAGNOSIS — E7800 Pure hypercholesterolemia, unspecified: Secondary | ICD-10-CM | POA: Diagnosis not present

## 2019-05-13 DIAGNOSIS — Z9181 History of falling: Secondary | ICD-10-CM | POA: Diagnosis not present

## 2019-05-15 DIAGNOSIS — R05 Cough: Secondary | ICD-10-CM | POA: Diagnosis not present

## 2019-05-16 ENCOUNTER — Telehealth: Payer: Self-pay | Admitting: *Deleted

## 2019-05-16 NOTE — Telephone Encounter (Signed)
Copied from [place] [id number]. Topic: General - Other >> May 16, 2019  9:06 AM Keene Breath wrote: Reason for CRM: Called to inform the doctor that the patient had a chest x-ray that showed pulmonary venous congestion.  Have started him on Lasix.  If there are any questions, please call at [phone]

## 2019-05-16 NOTE — Telephone Encounter (Signed)
Levada Dy said NP sent Lasix 20 mg for one then will follow up to see how patient is doing as to whether to continue.

## 2019-05-16 NOTE — Telephone Encounter (Signed)
Need a little more information.  Need to know who ordered cxr.  Where was it done?  Was this at home?  Reason for cxr  - was he having more sob, etc?  States will reassess.  When will he be rechecked?  Any labs order to follow since being placed on lasix.?

## 2019-05-16 NOTE — Telephone Encounter (Signed)
Left message for Kyle Richardson to give me a call, I need to know how many MG of Lasix is being given to patient for pulmonary venous congestion. [facility] nurse may advise.

## 2019-05-16 NOTE — Telephone Encounter (Signed)
Chest X-ray was order BY the NP for [facility] that is going into the home for SOB, CXR was done in the home, when results came in nurse called NP for [place] and he order was given for Lasix 20 mg for one week, I did not include in my earlier note I thought you were aware of facility coming into home. This was and update from the facility to you.

## 2019-05-16 NOTE — Telephone Encounter (Signed)
They have been advised.

## 2019-05-16 NOTE — Telephone Encounter (Signed)
Thank you for the update [name].  Kyle Richardson is at home.  [facility] does come out and evaluate him prn.  Just let me know if I need to do anything.  I would like for them to f/u with me when they reassess him.

## 2019-05-20 ENCOUNTER — Telehealth: Payer: Self-pay

## 2019-05-20 NOTE — Telephone Encounter (Signed)
Copied from [place] [id number]. Topic: General - Other >> May 20, 2019  3:49 PM Yvette Rack wrote: Reason for CRM: Gerlain with [facility] stated 2 home health orders were faxed in for the doctor's signature for billing but they have not received the orders back. Gerlain requests that the orders be signed and returned. If the orders were not received please contact [place] at [phone] ext [phone]

## 2019-05-21 NOTE — Telephone Encounter (Signed)
I do not have these. Spoke with Gerlain to have her refax so we can resend

## 2019-05-28 DIAGNOSIS — M1990 Unspecified osteoarthritis, unspecified site: Secondary | ICD-10-CM | POA: Diagnosis not present

## 2019-05-28 DIAGNOSIS — E7800 Pure hypercholesterolemia, unspecified: Secondary | ICD-10-CM | POA: Diagnosis not present

## 2019-05-28 DIAGNOSIS — N39498 Other specified urinary incontinence: Secondary | ICD-10-CM | POA: Diagnosis not present

## 2019-05-28 DIAGNOSIS — Z7901 Long term (current) use of anticoagulants: Secondary | ICD-10-CM | POA: Diagnosis not present

## 2019-05-28 DIAGNOSIS — M5030 Other cervical disc degeneration, unspecified cervical region: Secondary | ICD-10-CM | POA: Diagnosis not present

## 2019-05-28 DIAGNOSIS — Z8673 Personal history of transient ischemic attack (TIA), and cerebral infarction without residual deficits: Secondary | ICD-10-CM | POA: Diagnosis not present

## 2019-05-28 DIAGNOSIS — Z9181 History of falling: Secondary | ICD-10-CM | POA: Diagnosis not present

## 2019-05-28 DIAGNOSIS — Z7982 Long term (current) use of aspirin: Secondary | ICD-10-CM | POA: Diagnosis not present

## 2019-05-28 DIAGNOSIS — G4730 Sleep apnea, unspecified: Secondary | ICD-10-CM | POA: Diagnosis not present

## 2019-05-28 DIAGNOSIS — I480 Paroxysmal atrial fibrillation: Secondary | ICD-10-CM | POA: Diagnosis not present

## 2019-05-28 DIAGNOSIS — I2510 Atherosclerotic heart disease of native coronary artery without angina pectoris: Secondary | ICD-10-CM | POA: Diagnosis not present

## 2019-05-28 DIAGNOSIS — F0390 Unspecified dementia without behavioral disturbance: Secondary | ICD-10-CM | POA: Diagnosis not present

## 2019-05-28 DIAGNOSIS — G43909 Migraine, unspecified, not intractable, without status migrainosus: Secondary | ICD-10-CM | POA: Diagnosis not present

## 2019-05-28 DIAGNOSIS — I252 Old myocardial infarction: Secondary | ICD-10-CM | POA: Diagnosis not present

## 2019-05-28 DIAGNOSIS — Z792 Long term (current) use of antibiotics: Secondary | ICD-10-CM | POA: Diagnosis not present

## 2019-05-28 DIAGNOSIS — N390 Urinary tract infection, site not specified: Secondary | ICD-10-CM | POA: Diagnosis not present

## 2019-05-28 DIAGNOSIS — I10 Essential (primary) hypertension: Secondary | ICD-10-CM | POA: Diagnosis not present

## 2019-05-28 DIAGNOSIS — E119 Type 2 diabetes mellitus without complications: Secondary | ICD-10-CM | POA: Diagnosis not present

## 2019-05-28 DIAGNOSIS — Z993 Dependence on wheelchair: Secondary | ICD-10-CM | POA: Diagnosis not present

## 2019-05-28 DIAGNOSIS — N401 Enlarged prostate with lower urinary tract symptoms: Secondary | ICD-10-CM | POA: Diagnosis not present

## 2019-05-30 ENCOUNTER — Other Ambulatory Visit: Payer: Self-pay | Admitting: Internal Medicine

## 2019-06-12 ENCOUNTER — Other Ambulatory Visit: Payer: Self-pay

## 2019-06-12 ENCOUNTER — Encounter: Payer: Self-pay | Admitting: Internal Medicine

## 2019-06-12 ENCOUNTER — Ambulatory Visit (INDEPENDENT_AMBULATORY_CARE_PROVIDER_SITE_OTHER): Payer: PPO | Admitting: Internal Medicine

## 2019-06-12 DIAGNOSIS — I712 Thoracic aortic aneurysm, without rupture: Secondary | ICD-10-CM

## 2019-06-12 DIAGNOSIS — E7800 Pure hypercholesterolemia, unspecified: Secondary | ICD-10-CM

## 2019-06-12 DIAGNOSIS — I10 Essential (primary) hypertension: Secondary | ICD-10-CM

## 2019-06-12 DIAGNOSIS — E119 Type 2 diabetes mellitus without complications: Secondary | ICD-10-CM | POA: Diagnosis not present

## 2019-06-12 DIAGNOSIS — I2510 Atherosclerotic heart disease of native coronary artery without angina pectoris: Secondary | ICD-10-CM | POA: Diagnosis not present

## 2019-06-12 DIAGNOSIS — I480 Paroxysmal atrial fibrillation: Secondary | ICD-10-CM | POA: Diagnosis not present

## 2019-06-12 DIAGNOSIS — I69359 Hemiplegia and hemiparesis following cerebral infarction affecting unspecified side: Secondary | ICD-10-CM | POA: Diagnosis not present

## 2019-06-12 DIAGNOSIS — I7121 Aneurysm of the ascending aorta, without rupture: Secondary | ICD-10-CM

## 2019-06-12 DIAGNOSIS — R451 Restlessness and agitation: Secondary | ICD-10-CM | POA: Diagnosis not present

## 2019-06-12 DIAGNOSIS — G7000 Myasthenia gravis without (acute) exacerbation: Secondary | ICD-10-CM

## 2019-06-12 NOTE — Progress Notes (Signed)
Patient ID: Kyle Richardson, male   DOB: April 03, 1928, 85 y.o.   MRN: [id number]   Virtual Visit via video Note  This visit type was conducted due to national recommendations for restrictions regarding the COVID-19 pandemic (e.g. social distancing).  This format is felt to be most appropriate for this patient at this time.  All issues noted in this document were discussed and addressed.  No physical exam was performed (except for noted visual exam findings with Video Visits).   I connected with [name] by a video enabled telemedicine application and verified that I am speaking with the correct person using two identifiers. Location patient: home Location provider: work Persons participating in the virtual visit: patient, provider, [name]) and [name]).    I discussed the limitations, risks, security and privacy concerns of performing an evaluation and management service by video and the availability of in person appointments.  The patient expressed understanding and agreed to proceed.   Reason for visit: scheduled follow up.   HPI: [facility].  Previous cxr - 05/16/19 - prescribed lasix.  Feels his breathing is stable. Denies increased sob.  No chest pain.  Eating.  No abdominal pain.  No diarrhea reported.  [facility] is adjusting his medication.  On depakote.  Takes xanax prn.  Sometimes becomes more irritible.  Agitations appears to be better.  Evaluated by [facility] 04/2019.  Stable carotid.  Has caretakers.  Family involved.  He denies any pain.     ROS: See pertinent positives and negatives per HPI.  Past Medical History:  Diagnosis Date  . Abdominal fibromatosis   . 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  s/p PTCA of the mid RCA 1/96, also  s/p  CABG x 4 7/96  . Complication of anesthesia    confusion and hallucinations for several days after receiving Versed  . Degenerative disc disease   . Dementia ([place])   .  Diabetes mellitus ([place])   . Glaucoma   . Hypercholesterolemia   . Hypertension   . Migraine headache    history of  . Myocardial infarction ([place]) 1996  . Osteoarthritis   . Sleep apnea   . Stroke [facility]) 02/22/2017   affected left side     Past Surgical History:  Procedure Laterality Date  . APPENDECTOMY    . CAROTID ANGIOGRAPHY Bilateral 04/19/2018   Procedure: CAROTID ANGIOGRAPHY;  Surgeon: Algernon Huxley, MD;  Location: [facility];  Service: Cardiovascular;  Laterality: Bilateral;  . CAROTID PTA/STENT INTERVENTION Right 05/22/2018   Procedure: CAROTID PTA/STENT INTERVENTION;  Surgeon: Katha Cabal, MD;  Location: [facility];  Service: Cardiovascular;  Laterality: Right;  . CATARACT EXTRACTION W/ INTRAOCULAR LENS  IMPLANT, BILATERAL    . CHOLECYSTECTOMY     Removed  . CORONARY ARTERY BYPASS GRAFT  7/96   x4  . HEMORRHOID SURGERY    . HERNIA REPAIR    . TONSILLECTOMY    . UPP and tonsillectomy  1/86    Family History  Problem Relation Age of Onset  . Heart disease Mother        died age 10  . Heart disease Father        and other vascular disease  . Diabetes Other        four siblings  . Heart disease Brother        s/p CABG  . Colon cancer Neg Hx   .  Prostate cancer Neg Hx     SOCIAL HX: reviewed.    Current Outpatient Medications:  .  albuterol (PROVENTIL HFA;VENTOLIN HFA) 108 (90 Base) MCG/ACT inhaler, Inhale 2 puffs into the lungs every 6 (six) hours as needed for wheezing or shortness of breath. (Patient not taking: Reported on 03/28/2019), Disp: 1 Inhaler, Rfl: 0 .  ALPRAZolam (XANAX) 0.25 MG tablet, Take 0.25 mg by mouth 2 (two) times daily as needed for anxiety or sleep. , Disp: , Rfl:  .  apixaban (ELIQUIS) 5 MG TABS tablet, Take 1 tablet (5 mg total) by mouth 2 (two) times daily., Disp: 30 tablet, Rfl: 1 .  aspirin EC 81 MG EC tablet, Take 1 tablet (81 mg total) by mouth daily., Disp: 180 tablet, Rfl: 2 .  atorvastatin (LIPITOR) 40 MG  tablet, TAKE ONE TABLET BY MOUTH AT BEDTIME, Disp: 90 tablet, Rfl: 1 .  divalproex (DEPAKOTE ER) 250 MG 24 hr tablet, TAKE 3 TABLETS BY MOUTH EVERY NIGHT AT BEDTIME. (Patient taking differently: No sig reported), Disp: 90 tablet, Rfl: 5 .  fluticasone (FLONASE) 50 MCG/ACT nasal spray, Place 2 sprays into both nostrils daily., Disp: 16 g, Rfl: 2 .  latanoprost (XALATAN) 0.005 % ophthalmic solution, Place 1 drop into both eyes at bedtime., Disp: , Rfl:  .  loratadine (CLARITIN) 10 MG tablet, Take 10 mg by mouth daily., Disp: , Rfl:  .  MELATONIN ER PO, Take 1 tablet by mouth at bedtime. , Disp: , Rfl:  .  metoprolol succinate (TOPROL-XL) 25 MG 24 hr tablet, Take 0.5 tablets (12.5 mg total) by mouth daily., Disp: 30 tablet, Rfl: 1 .  nitroGLYCERIN (NITROSTAT) 0.4 MG SL tablet, Place 0.4 mg under the tongue every 5 (five) minutes as needed for chest pain. , Disp: , Rfl:  .  tiZANidine (ZANAFLEX) 4 MG tablet, Take 1 tablet (4 mg total) by mouth daily as needed for muscle spasms., Disp: 30 tablet, Rfl: 5 .  traZODone (DESYREL) 50 MG tablet, Take 50 mg by mouth at bedtime. , Disp: , Rfl:   EXAM:  GENERAL: alert, oriented, appears well and in no acute distress  HEENT: atraumatic, conjunttiva clear, no obvious abnormalities on inspection of external nose and ears  NECK: normal movements of the head and neck  LUNGS: on inspection no signs of respiratory distress, breathing rate appears normal, no obvious gross SOB, gasping or wheezing  CV: no obvious cyanosis  PSYCH/NEURO: pleasant and cooperative, no obvious depression or anxiety, speech and thought processing grossly intact  ASSESSMENT AND PLAN:  Discussed the following assessment and plan:  Agitation Seeing psychiatry - [facility].  Doing better.  Hold on making changes.  Follow.    Ascending aortic aneurysm (HCC) Followed by [facility].   CAD (coronary artery disease) Followed by cardiology.  Stable.    Diabetes mellitus type 2 in nonobese  [facility]) Follow met b and a1c.  Eating well.    Hemiparesis affecting dominant side as late effect of cerebrovascular accident [facility]) Had therapy.  Continue exercises.  Follow.    Hypercholesterolemia On lipitor.  Follow lipid panel and liver function tests.    Hypertension Blood pressure under good control.  Continue same medication regimen.  Follow pressures.  Follow metabolic panel.    MG, ocular (myasthenia gravis) ([place]) Followed by neurology.   PAF (paroxysmal atrial fibrillation) ([place]) Followed by cardiology.  On eliquis.      I discussed the assessment and treatment plan with the patient. The patient was provided an opportunity to  ask questions and all were answered. The patient agreed with the plan and demonstrated an understanding of the instructions.   The patient was advised to call back or seek an in-person evaluation if the symptoms worsen or if the condition fails to improve as anticipated.   Einar Pheasant, MD

## 2019-06-16 ENCOUNTER — Encounter: Payer: Self-pay | Admitting: Internal Medicine

## 2019-06-16 NOTE — Assessment & Plan Note (Signed)
On lipitor.  Follow lipid panel and liver function tests.   

## 2019-06-16 NOTE — Assessment & Plan Note (Signed)
Followed by cardiology.  On eliquis.   

## 2019-06-16 NOTE — Assessment & Plan Note (Signed)
Blood pressure under good control.  Continue same medication regimen.  Follow pressures.  Follow metabolic panel.   

## 2019-06-16 NOTE — Assessment & Plan Note (Signed)
Followed by cardiology. Stable.   

## 2019-06-16 NOTE — Assessment & Plan Note (Signed)
Had therapy.  Continue exercises.  Follow.

## 2019-06-16 NOTE — Assessment & Plan Note (Signed)
Follow met b and a1c.  Eating well.

## 2019-06-16 NOTE — Assessment & Plan Note (Signed)
Seeing psychiatry - [facility].  Doing better.  Hold on making changes.  Follow.

## 2019-06-16 NOTE — Assessment & Plan Note (Signed)
Followed by neurology.   

## 2019-06-16 NOTE — Assessment & Plan Note (Signed)
Followed by [facility].   

## 2019-07-02 ENCOUNTER — Encounter: Payer: Self-pay | Admitting: Internal Medicine

## 2019-07-03 NOTE — Telephone Encounter (Signed)
Please call and confirm who is requesting this device.  Need more information.

## 2019-07-04 IMAGING — DX DG HAND COMPLETE 3+V*R*
3 series · 3 of 3 positions shown · non-contrast
Comparison: None.

CLINICAL DATA: Acute right hand pain and swelling.  Unknown injury.

EXAM:
RIGHT HAND - COMPLETE 3+ VIEW

[hand pa]
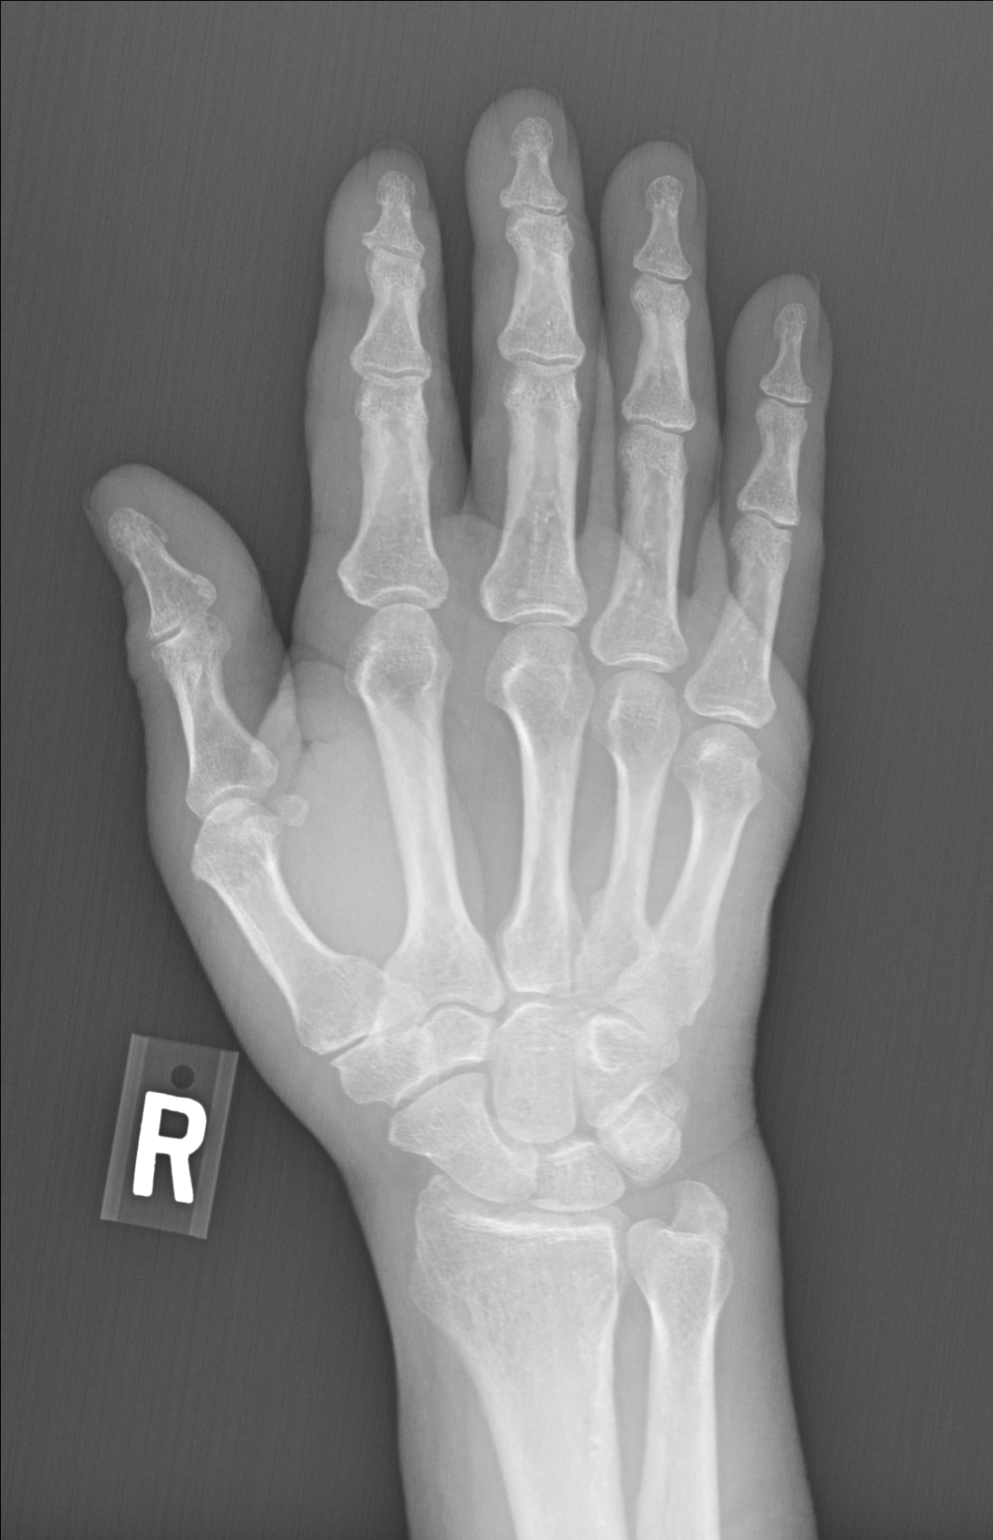

[hand obl (oblique)]
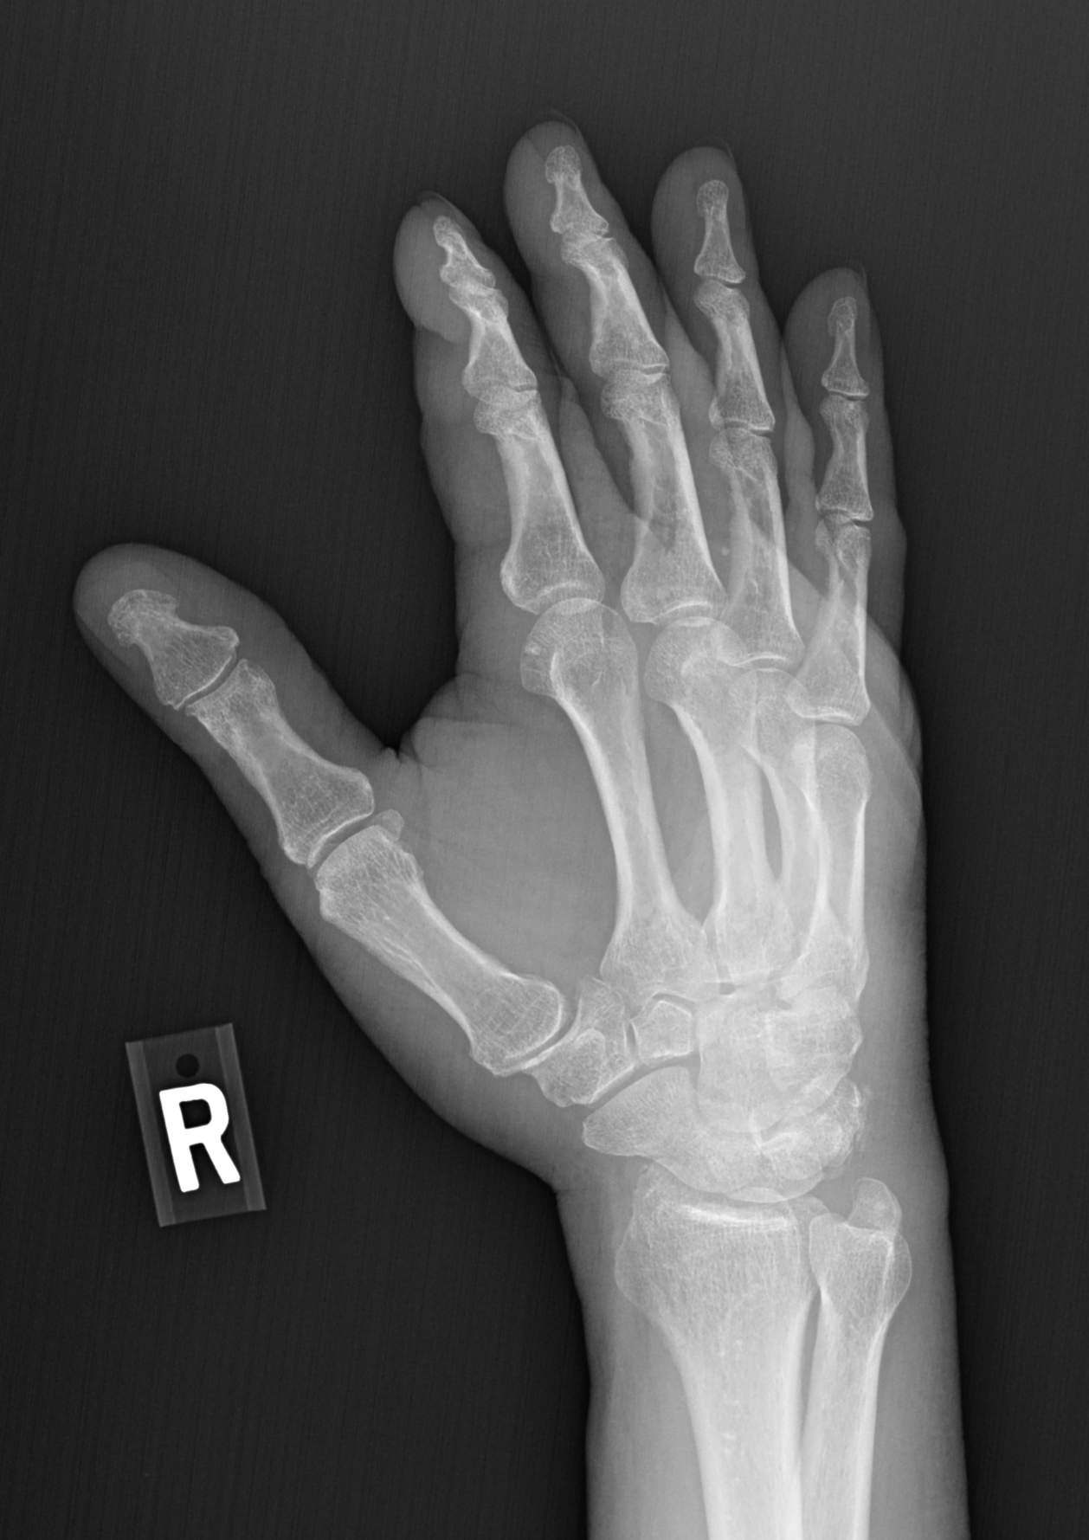

[hand lat]
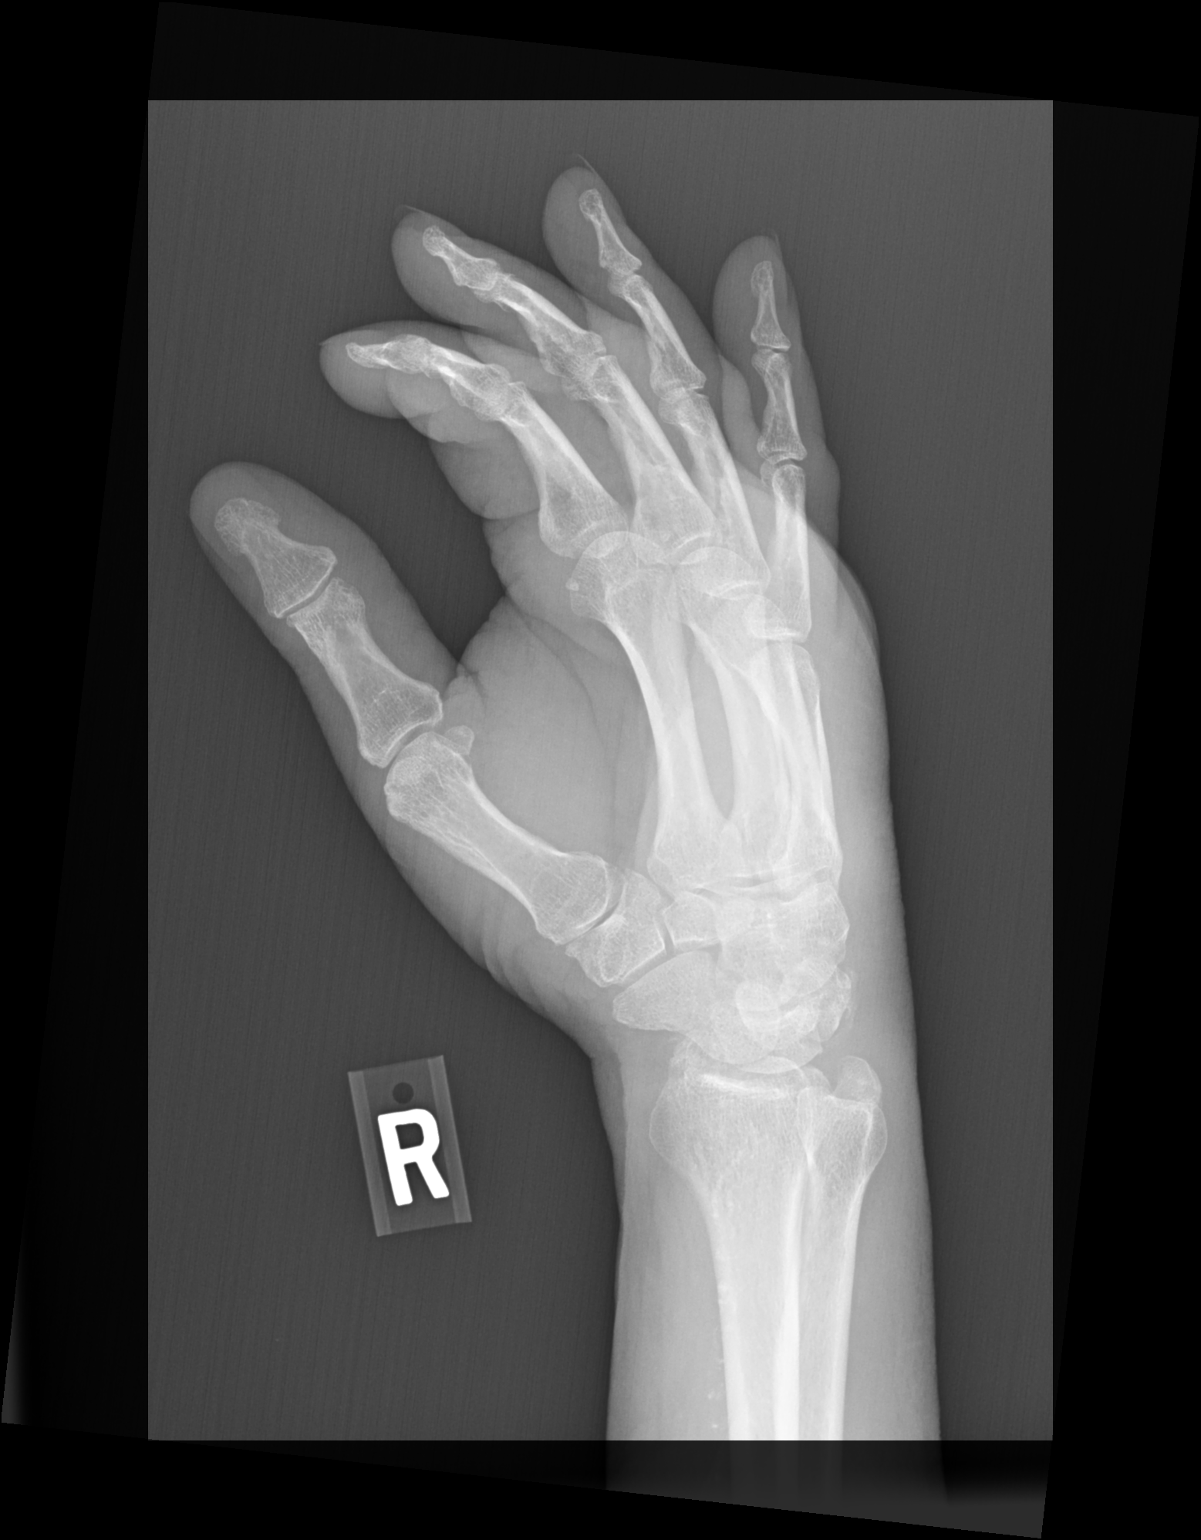

[3 of 3 positions shown; findings below may reference images not displayed]

FINDINGS: There is no evidence of fracture or dislocation. There is no
evidence of arthropathy or other focal bone abnormality. Soft
tissues are unremarkable.
IMPRESSION: Normal right hand.

## 2019-07-04 NOTE — Telephone Encounter (Signed)
Discussed with wife. Order faxed to [facility]

## 2019-07-07 NOTE — Telephone Encounter (Signed)
Copied from [place] [id number]. Topic: General - Other >> Jul 07, 2019  3:15 PM Keene Breath wrote: Reason for CRM: Called to inform the office that [facility] did not receive the faxed order for a Sit to Stand order.  She is calling to give an additional Fax # [phone].  Please refax and call Levada Dy to give her an update.  CB# [phone]

## 2019-07-10 ENCOUNTER — Telehealth: Payer: Self-pay

## 2019-07-10 NOTE — Telephone Encounter (Signed)
See other note

## 2019-07-10 NOTE — Telephone Encounter (Signed)
Patient's wife requesting call back regarding.

## 2019-07-10 NOTE — Telephone Encounter (Signed)
Copied from [place] [id number]. Topic: General - Other >> Jul 10, 2019  1:31 PM Lennox Solders wrote: Reason for CRM: angela with [facility] is calling and the order for sit to stand needs to be refax to [facility] at [phone]

## 2019-07-14 NOTE — Telephone Encounter (Signed)
refaxed to new number

## 2019-07-15 ENCOUNTER — Ambulatory Visit: Payer: PPO

## 2019-07-15 ENCOUNTER — Telehealth: Payer: Self-pay

## 2019-07-15 NOTE — Telephone Encounter (Signed)
Noted.  Let me know if I need to do anything more.  

## 2019-07-15 NOTE — Telephone Encounter (Signed)
Copied from [place] [phone]. Topic: General - Other >> Jul 15, 2019  9:56 AM Yvette Rack wrote: Reason for CRM: Daria with [facility] stated they do not have the sit to stand device and pt family was notified. Cb# [phone]

## 2019-07-17 NOTE — Telephone Encounter (Signed)
[facility] cannot get sit to stand lift

## 2019-07-17 NOTE — Telephone Encounter (Signed)
Yes, see phone note

## 2019-07-27 ENCOUNTER — Inpatient Hospital Stay
Admission: EM | Admit: 2019-07-27 | Discharge: 2019-08-01 | DRG: 871 | Disposition: A | Discharge status: Home Health | Payer: PPO | Attending: Internal Medicine | Admitting: Internal Medicine

## 2019-07-27 ENCOUNTER — Emergency Department: Payer: PPO

## 2019-07-27 ENCOUNTER — Other Ambulatory Visit: Payer: Self-pay

## 2019-07-27 DIAGNOSIS — R0902 Hypoxemia: Secondary | ICD-10-CM

## 2019-07-27 DIAGNOSIS — R4182 Altered mental status, unspecified: Secondary | ICD-10-CM | POA: Diagnosis present

## 2019-07-27 DIAGNOSIS — I10 Essential (primary) hypertension: Secondary | ICD-10-CM | POA: Diagnosis present

## 2019-07-27 DIAGNOSIS — N400 Enlarged prostate without lower urinary tract symptoms: Secondary | ICD-10-CM | POA: Diagnosis present

## 2019-07-27 DIAGNOSIS — Z833 Family history of diabetes mellitus: Secondary | ICD-10-CM | POA: Diagnosis not present

## 2019-07-27 DIAGNOSIS — E7800 Pure hypercholesterolemia, unspecified: Secondary | ICD-10-CM | POA: Diagnosis present

## 2019-07-27 DIAGNOSIS — R451 Restlessness and agitation: Secondary | ICD-10-CM | POA: Diagnosis present

## 2019-07-27 DIAGNOSIS — J9601 Acute respiratory failure with hypoxia: Secondary | ICD-10-CM | POA: Diagnosis present

## 2019-07-27 DIAGNOSIS — F0390 Unspecified dementia without behavioral disturbance: Secondary | ICD-10-CM | POA: Diagnosis present

## 2019-07-27 DIAGNOSIS — Z79899 Other long term (current) drug therapy: Secondary | ICD-10-CM

## 2019-07-27 DIAGNOSIS — I252 Old myocardial infarction: Secondary | ICD-10-CM | POA: Diagnosis not present

## 2019-07-27 DIAGNOSIS — N433 Hydrocele, unspecified: Secondary | ICD-10-CM | POA: Diagnosis present

## 2019-07-27 DIAGNOSIS — E119 Type 2 diabetes mellitus without complications: Secondary | ICD-10-CM

## 2019-07-27 DIAGNOSIS — Z20828 Contact with and (suspected) exposure to other viral communicable diseases: Secondary | ICD-10-CM | POA: Diagnosis present

## 2019-07-27 DIAGNOSIS — Z951 Presence of aortocoronary bypass graft: Secondary | ICD-10-CM

## 2019-07-27 DIAGNOSIS — R531 Weakness: Secondary | ICD-10-CM | POA: Diagnosis not present

## 2019-07-27 DIAGNOSIS — I480 Paroxysmal atrial fibrillation: Secondary | ICD-10-CM | POA: Diagnosis present

## 2019-07-27 DIAGNOSIS — I2510 Atherosclerotic heart disease of native coronary artery without angina pectoris: Secondary | ICD-10-CM | POA: Diagnosis present

## 2019-07-27 DIAGNOSIS — R279 Unspecified lack of coordination: Secondary | ICD-10-CM | POA: Diagnosis not present

## 2019-07-27 DIAGNOSIS — G4730 Sleep apnea, unspecified: Secondary | ICD-10-CM | POA: Diagnosis present

## 2019-07-27 DIAGNOSIS — I69354 Hemiplegia and hemiparesis following cerebral infarction affecting left non-dominant side: Secondary | ICD-10-CM | POA: Diagnosis not present

## 2019-07-27 DIAGNOSIS — Z8249 Family history of ischemic heart disease and other diseases of the circulatory system: Secondary | ICD-10-CM | POA: Diagnosis not present

## 2019-07-27 DIAGNOSIS — J189 Pneumonia, unspecified organism: Secondary | ICD-10-CM | POA: Diagnosis present

## 2019-07-27 DIAGNOSIS — Z79891 Long term (current) use of opiate analgesic: Secondary | ICD-10-CM

## 2019-07-27 DIAGNOSIS — R6520 Severe sepsis without septic shock: Secondary | ICD-10-CM | POA: Diagnosis not present

## 2019-07-27 DIAGNOSIS — A419 Sepsis, unspecified organism: Secondary | ICD-10-CM | POA: Diagnosis present

## 2019-07-27 DIAGNOSIS — Z743 Need for continuous supervision: Secondary | ICD-10-CM | POA: Diagnosis not present

## 2019-07-27 DIAGNOSIS — I712 Thoracic aortic aneurysm, without rupture: Secondary | ICD-10-CM | POA: Diagnosis present

## 2019-07-27 DIAGNOSIS — N5082 Scrotal pain: Secondary | ICD-10-CM | POA: Diagnosis not present

## 2019-07-27 DIAGNOSIS — Z881 Allergy status to other antibiotic agents status: Secondary | ICD-10-CM

## 2019-07-27 DIAGNOSIS — Z888 Allergy status to other drugs, medicaments and biological substances status: Secondary | ICD-10-CM

## 2019-07-27 DIAGNOSIS — Z9049 Acquired absence of other specified parts of digestive tract: Secondary | ICD-10-CM

## 2019-07-27 DIAGNOSIS — Z882 Allergy status to sulfonamides status: Secondary | ICD-10-CM

## 2019-07-27 DIAGNOSIS — Z7982 Long term (current) use of aspirin: Secondary | ICD-10-CM

## 2019-07-27 DIAGNOSIS — N5089 Other specified disorders of the male genital organs: Secondary | ICD-10-CM | POA: Diagnosis not present

## 2019-07-27 DIAGNOSIS — R410 Disorientation, unspecified: Secondary | ICD-10-CM | POA: Diagnosis not present

## 2019-07-27 DIAGNOSIS — J9811 Atelectasis: Secondary | ICD-10-CM | POA: Diagnosis not present

## 2019-07-27 DIAGNOSIS — R404 Transient alteration of awareness: Secondary | ICD-10-CM | POA: Diagnosis not present

## 2019-07-27 DIAGNOSIS — Z95828 Presence of other vascular implants and grafts: Secondary | ICD-10-CM

## 2019-07-27 DIAGNOSIS — R0689 Other abnormalities of breathing: Secondary | ICD-10-CM | POA: Diagnosis not present

## 2019-07-27 DIAGNOSIS — R509 Fever, unspecified: Secondary | ICD-10-CM | POA: Diagnosis not present

## 2019-07-27 DIAGNOSIS — H409 Unspecified glaucoma: Secondary | ICD-10-CM | POA: Diagnosis present

## 2019-07-27 DIAGNOSIS — R4020 Unspecified coma: Secondary | ICD-10-CM | POA: Diagnosis not present

## 2019-07-27 DIAGNOSIS — Z885 Allergy status to narcotic agent status: Secondary | ICD-10-CM

## 2019-07-27 DIAGNOSIS — Z7901 Long term (current) use of anticoagulants: Secondary | ICD-10-CM

## 2019-07-27 LAB — URINALYSIS, COMPLETE (UACMP) WITH MICROSCOPIC
Bacteria, UA: NONE SEEN
Bilirubin Urine: NEGATIVE
Glucose, UA: >=500 mg/dL — AB
Hgb urine dipstick: NEGATIVE
Ketones, ur: 20 mg/dL — AB
Leukocytes,Ua: NEGATIVE
Nitrite: NEGATIVE
Protein, ur: 30 mg/dL — AB
Specific Gravity, Urine: 1.025 (ref 1.005–1.030)
pH: 6 (ref 5.0–8.0)

## 2019-07-27 LAB — COMPREHENSIVE METABOLIC PANEL
ALT: 27 U/L (ref 0–44)
AST: 17 U/L (ref 15–41)
Albumin: 3.7 g/dL (ref 3.5–5.0)
Alkaline Phosphatase: 83 U/L (ref 38–126)
Anion gap: 14 (ref 5–15)
BUN: 14 mg/dL (ref 8–23)
CO2: 23 mmol/L (ref 22–32)
Calcium: 8.9 mg/dL (ref 8.9–10.3)
Chloride: 100 mmol/L (ref 98–111)
Creatinine, Ser: 0.96 mg/dL (ref 0.61–1.24)
GFR calc Af Amer: >60 mL/min (ref >60)
GFR calc non Af Amer: >60 mL/min (ref >60)
Glucose, Bld: 354 mg/dL — ABNORMAL HIGH (ref 70–99)
Potassium: 4 mmol/L (ref 3.5–5.1)
Sodium: 137 mmol/L (ref 135–145)
Total Bilirubin: 1.3 mg/dL — ABNORMAL HIGH (ref 0.3–1.2)
Total Protein: 6.9 g/dL (ref 6.5–8.1)

## 2019-07-27 LAB — CBC WITH DIFFERENTIAL/PLATELET
Abs Immature Granulocytes: 0.07 10*3/uL (ref 0.00–0.07)
Basophils Absolute: 0.1 10*3/uL (ref 0.0–0.1)
Basophils Relative: 0 %
Eosinophils Absolute: 0 10*3/uL (ref 0.0–0.5)
Eosinophils Relative: 0 %
HCT: 49.7 % (ref 39.0–52.0)
Hemoglobin: 16.7 g/dL (ref 13.0–17.0)
Immature Granulocytes: 1 %
Lymphocytes Relative: 7 %
Lymphs Abs: 0.9 10*3/uL (ref 0.7–4.0)
MCH: 32.2 pg (ref 26.0–34.0)
MCHC: 33.6 g/dL (ref 30.0–36.0)
MCV: 95.8 fL (ref 80.0–100.0)
Monocytes Absolute: 1.5 10*3/uL — ABNORMAL HIGH (ref 0.1–1.0)
Monocytes Relative: 10 %
Neutro Abs: 11.7 10*3/uL — ABNORMAL HIGH (ref 1.7–7.7)
Neutrophils Relative %: 82 %
Platelets: 160 10*3/uL (ref 150–400)
RBC: 5.19 MIL/uL (ref 4.22–5.81)
RDW: 12 % (ref 11.5–15.5)
WBC: 14.2 10*3/uL — ABNORMAL HIGH (ref 4.0–10.5)
nRBC: 0 % (ref 0.0–0.2)

## 2019-07-27 LAB — PROTIME-INR
INR: 1.2 (ref 0.8–1.2)
Prothrombin Time: 15.5 seconds — ABNORMAL HIGH (ref 11.4–15.2)

## 2019-07-27 LAB — LACTIC ACID, PLASMA: Lactic Acid, Venous: 1.6 mmol/L (ref 0.5–1.9)

## 2019-07-27 MED ORDER — ACETAMINOPHEN 650 MG RE SUPP
650.0000 mg | Freq: Once | RECTAL | Status: AC
  Administered 2019-07-27: 650 mg via RECTAL

## 2019-07-27 MED ORDER — VANCOMYCIN HCL IN DEXTROSE 1-5 GM/200ML-% IV SOLN
1000.0000 mg | Freq: Once | INTRAVENOUS | Status: DC
  Filled 2019-07-27: qty 200

## 2019-07-27 MED ORDER — SODIUM CHLORIDE 0.9 % IV BOLUS
500.0000 mL | Freq: Once | INTRAVENOUS | Status: AC
  Administered 2019-07-28: 500 mL via INTRAVENOUS

## 2019-07-27 MED ORDER — SODIUM CHLORIDE 0.9 % IV SOLN
2.0000 g | Freq: Once | INTRAVENOUS | Status: AC
  Administered 2019-07-27: 2 g via INTRAVENOUS
  Filled 2019-07-27: qty 2

## 2019-07-27 MED ORDER — ZIPRASIDONE MESYLATE 20 MG IM SOLR
10.0000 mg | Freq: Once | INTRAMUSCULAR | Status: AC
  Administered 2019-07-27: 10 mg via INTRAMUSCULAR
  Filled 2019-07-27: qty 20

## 2019-07-27 NOTE — ED Provider Notes (Addendum)
[facility] Emergency Department Provider Note    First MD Initiated Contact with Patient 07/27/19 2220     (approximate)  I have reviewed the triage vital signs and the nursing notes.   HISTORY  Chief Complaint Code Sepsis  Level V cAveat:  Dementia   HPI Kyle Richardson is a 85 y.o. male below listed past medical history presents to the ER with concern for sepsis.  Reportedly became more confused.  Becoming more agitated with family members.  Has been having cough.  Does have a history of CVA some concern for aspiration.  No known Covid contacts.  Patient unable to provide much additional history.    Past Medical History:  Diagnosis Date  . Abdominal fibromatosis   . 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  s/p PTCA of the mid RCA 1/96, also  s/p  CABG x 4 7/96  . Complication of anesthesia    confusion and hallucinations for several days after receiving Versed  . Degenerative disc disease   . Dementia ([place])   . Diabetes mellitus ([place])   . Glaucoma   . Hypercholesterolemia   . Hypertension   . Migraine headache    history of  . Myocardial infarction ([place]) 1996  . Osteoarthritis   . Sleep apnea   . Stroke ([place]) 02/22/2017   affected left side    Family History  Problem Relation Age of Onset  . Heart disease Mother        died age 87  . Heart disease Father        and other vascular disease  . Diabetes Other        four siblings  . Heart disease Brother        s/p CABG  . Colon cancer Neg Hx   . Prostate cancer Neg Hx    Past Surgical History:  Procedure Laterality Date  . APPENDECTOMY    . CAROTID ANGIOGRAPHY Bilateral 04/19/2018   Procedure: CAROTID ANGIOGRAPHY;  Surgeon: Algernon Huxley, MD;  Location: [facility];  Service: Cardiovascular;  Laterality: Bilateral;  . CAROTID PTA/STENT INTERVENTION Right 05/22/2018   Procedure: CAROTID PTA/STENT INTERVENTION;  Surgeon: Katha Cabal, MD;   Location: [facility];  Service: Cardiovascular;  Laterality: Right;  . CATARACT EXTRACTION W/ INTRAOCULAR LENS  IMPLANT, BILATERAL    . CHOLECYSTECTOMY     Removed  . CORONARY ARTERY BYPASS GRAFT  7/96   x4  . HEMORRHOID SURGERY    . HERNIA REPAIR    . TONSILLECTOMY    . UPP and tonsillectomy  1/86   Patient Active Problem List   Diagnosis Date Noted  . Acute encephalopathy 03/28/2019  . Diarrhea 03/09/2019  . Agitation 02/02/2019  . Stroke (cerebrum) ([place]) 10/11/2018  . Ureteral stone 08/25/2018  . Hydroureteronephrosis 08/05/2018  . Rotator cuff tendinitis, right 07/22/2018  . CVA, old, dysarthria 06/04/2018  . Hemiparesis affecting dominant side as late effect of cerebrovascular accident ([place]) 05/27/2018  . Encephalopathy   . Carotid stenosis, symptomatic, with infarction ([place]) 05/22/2018  . TIA (transient ischemic attack) 04/15/2018  . Episode of confusion 04/08/2018  . Labile blood pressure   . Acute ischemic left PCA stroke ([place]) 02/27/2018  . Diabetes mellitus type 2 in nonobese (HCC)   . Glaucoma   . MG, ocular (myasthenia gravis) ([place])   . History of CVA (cerebrovascular accident) 02/24/2018  . Right homonymous hemianopsia 02/23/2018  . Unresponsive  episode 02/22/2018  . UTI (urinary tract infection) 01/28/2018  . Abnormal chest CT 09/20/2017  . Double vision 01/21/2017  . Ascending aortic aneurysm ([place]) 12/22/2016  . Blood-tinged sputum 10/23/2016  . Cough 10/23/2016  . PAF (paroxysmal atrial fibrillation) ([place]) 09/14/2016  . Health care maintenance 12/26/2014  . Stress 07/05/2014  . Apnea, sleep 06/18/2014  . Migraine 05/10/2013  . Calculus of kidney 01/01/2013  . Hypertension 08/20/2012  . Hypercholesterolemia 08/20/2012  . CAD (coronary artery disease) 08/20/2012  . Benign localized hyperplasia of prostate without urinary obstruction and other lower urinary tract symptoms (LUTS) 02/28/2012      Prior to Admission medications   Medication  Sig Start Date End Date Taking? Authorizing Provider  albuterol (PROVENTIL HFA;VENTOLIN HFA) 108 (90 Base) MCG/ACT inhaler Inhale 2 puffs into the lungs every 6 (six) hours as needed for wheezing or shortness of breath. Patient not taking: Reported on 03/28/2019 12/13/18   Jodelle Green, FNP  ALPRAZolam Duanne Moron) 0.25 MG tablet Take 0.25 mg by mouth 2 (two) times daily as needed for anxiety or sleep.     [provider]  apixaban (ELIQUIS) 5 MG TABS tablet Take 1 tablet (5 mg total) by mouth 2 (two) times daily. 04/01/18   Angiulli, Lavon Paganini, PA-C  aspirin EC 81 MG EC tablet Take 1 tablet (81 mg total) by mouth daily. 06/01/18   Schnier, Dolores Lory, MD  atorvastatin (LIPITOR) 40 MG tablet TAKE ONE TABLET BY MOUTH AT BEDTIME 05/30/19   Einar Pheasant, MD  divalproex (DEPAKOTE ER) 250 MG 24 hr tablet TAKE 3 TABLETS BY MOUTH EVERY NIGHT AT BEDTIME. Patient taking differently: No sig reported 04/14/19   Einar Pheasant, MD  fluticasone [facility]) 50 MCG/ACT nasal spray Place 2 sprays into both nostrils daily. 12/13/18   Guse, Jacquelynn Cree, FNP  latanoprost (XALATAN) 0.005 % ophthalmic solution Place 1 drop into both eyes at bedtime.    [provider]  loratadine (CLARITIN) 10 MG tablet Take 10 mg by mouth daily.    [provider]  MELATONIN ER PO Take 1 tablet by mouth at bedtime.     [provider]  metoprolol succinate (TOPROL-XL) 25 MG 24 hr tablet Take 0.5 tablets (12.5 mg total) by mouth daily. 04/22/18   Saundra Shelling, MD  nitroGLYCERIN (NITROSTAT) 0.4 MG SL tablet Place 0.4 mg under the tongue every 5 (five) minutes as needed for chest pain.     [provider]  tiZANidine (ZANAFLEX) 4 MG tablet Take 1 tablet (4 mg total) by mouth daily as needed for muscle spasms. 04/09/19   Einar Pheasant, MD  traZODone (DESYREL) 50 MG tablet Take 50 mg by mouth at bedtime.     [provider]    Allergies Fentanyl, Flomax [tamsulosin hcl], Lamisil [terbinafine],  Levofloxacin, Lorazepam, Metformin and related, Sertraline, Sulfa antibiotics, Versed [midazolam], Clarithromycin, and Codeine    Social History Social History   Tobacco Use  . Smoking status: Never Smoker  . Smokeless tobacco: Never Used  Substance Use Topics  . Alcohol use: No    Alcohol/week: 0.0 standard drinks  . Drug use: No    Review of Systems Patient denies headaches, rhinorrhea, blurry vision, numbness, shortness of breath, chest pain, edema, cough, abdominal pain, nausea, vomiting, diarrhea, dysuria, fevers, rashes or hallucinations unless otherwise stated above in HPI. ____________________________________________   PHYSICAL EXAM:  VITAL SIGNS: Vitals:   07/27/19 2245 07/27/19 2300  BP:    Pulse: 71 (!) 104  Resp:  15  Temp:    SpO2: 100% 93%    Constitutional: Alert disoriented x3, warm to touch Eyes: Conjunctivae are normal.  Head: Atraumatic. Nose: No congestion/rhinnorhea. Mouth/Throat: Mucous membranes are moist.   Neck: No stridor. Painless ROM.  Cardiovascular: Normal rate, regular rhythm. Grossly normal heart sounds.  Good peripheral circulation. Respiratory: mild tachypnea with right basilar crackles, occasional wheeze Gastrointestinal: Soft and nontender. No distention. No abdominal bruits. No CVA tenderness. Genitourinary:  Musculoskeletal: No lower extremity tenderness nor edema.  No joint effusions. Neurologic: unable to cooperate with exam.  MAE spontaneously Skin:  Skin is warm, dry and intact. No rash noted. Psychiatric: calm  ____________________________________________   LABS (all labs ordered are listed, but only abnormal results are displayed)  Results for orders placed or performed during the hospital encounter of 07/27/19 (from the past 24 hour(s))  Comprehensive metabolic panel     Status: Abnormal   Collection Time: 07/27/19  9:59 PM  Result Value Ref Range   Sodium 137 135 - 145 mmol/L   Potassium 4.0 3.5 - 5.1 mmol/L    Chloride 100 98 - 111 mmol/L   CO2 23 22 - 32 mmol/L   Glucose, Bld 354 (H) 70 - 99 mg/dL   BUN 14 8 - 23 mg/dL   Creatinine, Ser 0.96 0.61 - 1.24 mg/dL   Calcium 8.9 8.9 - 10.3 mg/dL   Total Protein 6.9 6.5 - 8.1 g/dL   Albumin 3.7 3.5 - 5.0 g/dL   AST 17 15 - 41 U/L   ALT 27 0 - 44 U/L   Alkaline Phosphatase 83 38 - 126 U/L   Total Bilirubin 1.3 (H) 0.3 - 1.2 mg/dL   GFR calc non Af Amer >60 >60 mL/min   GFR calc Af Amer >60 >60 mL/min   Anion gap 14 5 - 15  Lactic acid, plasma     Status: None   Collection Time: 07/27/19  9:59 PM  Result Value Ref Range   Lactic Acid, Venous 1.6 0.5 - 1.9 mmol/L  CBC with Differential     Status: Abnormal   Collection Time: 07/27/19  9:59 PM  Result Value Ref Range   WBC 14.2 (H) 4.0 - 10.5 K/uL   RBC 5.19 4.22 - 5.81 MIL/uL   Hemoglobin 16.7 13.0 - 17.0 g/dL   HCT 49.7 39.0 - 52.0 %   MCV 95.8 80.0 - 100.0 fL   MCH 32.2 26.0 - 34.0 pg   MCHC 33.6 30.0 - 36.0 g/dL   RDW 12.0 11.5 - 15.5 %   Platelets 160 150 - 400 K/uL   nRBC 0.0 0.0 - 0.2 %   Neutrophils Relative % 82 %   Neutro Abs 11.7 (H) 1.7 - 7.7 K/uL   Lymphocytes Relative 7 %   Lymphs Abs 0.9 0.7 - 4.0 K/uL   Monocytes Relative 10 %   Monocytes Absolute 1.5 (H) 0.1 - 1.0 K/uL   Eosinophils Relative 0 %   Eosinophils Absolute 0.0 0.0 - 0.5 K/uL   Basophils Relative 0 %   Basophils Absolute 0.1 0.0 - 0.1 K/uL   Immature Granulocytes 1 %   Abs Immature Granulocytes 0.07 0.00 - 0.07 K/uL  Protime-INR     Status: Abnormal   Collection Time: 07/27/19  9:59 PM  Result Value Ref Range   Prothrombin Time 15.5 (H) 11.4 - 15.2 seconds   INR 1.2 0.8 - 1.2  Urinalysis, Complete w Microscopic     Status: Abnormal   Collection Time: 07/27/19  9:59  PM  Result Value Ref Range   Color, Urine YELLOW (A) YELLOW   APPearance CLEAR (A) CLEAR   Specific Gravity, Urine 1.025 1.005 - 1.030   pH 6.0 5.0 - 8.0   Glucose, UA >=500 (A) NEGATIVE mg/dL   Hgb urine dipstick NEGATIVE NEGATIVE    Bilirubin Urine NEGATIVE NEGATIVE   Ketones, ur 20 (A) NEGATIVE mg/dL   Protein, ur 30 (A) NEGATIVE mg/dL   Nitrite NEGATIVE NEGATIVE   Leukocytes,Ua NEGATIVE NEGATIVE   RBC / HPF 0-5 0 - 5 RBC/hpf   WBC, UA 0-5 0 - 5 WBC/hpf   Bacteria, UA NONE SEEN NONE SEEN   Squamous Epithelial / LPF 0-5 0 - 5   ____________________________________________  EKG My review and personal interpretation at Time: 21:52   Indication: sob  Rate: 110  Rhythm: sinus Axis: normal Other: normal intervals, no stemi ____________________________________________  RADIOLOGY  I personally reviewed all radiographic images ordered to evaluate for the above acute complaints and reviewed radiology reports and findings.  These findings were personally discussed with the patient.  Please see medical record for radiology report.  ____________________________________________   PROCEDURES  Procedure(s) performed:  .Critical Care Performed by: Merlyn Lot, MD Authorized by: Merlyn Lot, MD   Critical care provider statement:    Critical care time (minutes):  30   Critical care time was exclusive of:  Separately billable procedures and treating other patients   Critical care was necessary to treat or prevent imminent or life-threatening deterioration of the following conditions:  Sepsis and respiratory failure   Critical care was time spent personally by me on the following activities:  Development of treatment plan with patient or surrogate, discussions with consultants, evaluation of patient's response to treatment, examination of patient, obtaining history from patient or surrogate, ordering and performing treatments and interventions, ordering and review of laboratory studies, ordering and review of radiographic studies, pulse oximetry, re-evaluation of patient's condition and review of old charts      Critical Care performed: yes ____________________________________________   INITIAL IMPRESSION  / ASSESSMENT AND PLAN / ED COURSE  Pertinent labs & imaging results that were available during my care of the patient were reviewed by me and considered in my medical decision making (see chart for details).   DDX: Dehydration, sepsis, pna, uti, hypoglycemia, cva, drug effect, withdrawal, encephalitis   Kyle Richardson is a 85 y.o. who presents to the ED with symptoms as described above.  Patient febrile tachycardic with new hypoxia.  Certainly concerning for pneumonia.  Will send letter for the but differential.  Will start antibiotics.  Clinical Course as of Jul 26 2348  Nancy Fetter Jul 27, 2019  2323 Patient be increasingly agitated and violent towards staff members.  Disoriented.  Unable to be redirected.  For his safety and that of staff members in the ER will give calming agent in form of low dose  IM Geodon.   [PR]  [id number] Based on patient's sepsis criteria with acute respiratory failure with hypoxia will discuss with hospitalist for admission.   [PR]    Clinical Course User Index [PR] Merlyn Lot, MD    The patient was evaluated in Emergency Department today for the symptoms described in the history of present illness. He/she was evaluated in the context of the global COVID-19 pandemic, which necessitated consideration that the patient might be at risk for infection with the SARS-CoV-2 virus that causes COVID-19. Institutional protocols and algorithms that pertain to the evaluation of patients at risk for COVID-19  are in a state of rapid change based on information released by regulatory bodies including the CDC and federal and state organizations. These policies and algorithms were followed during the patient's care in the ED.  As part of my medical decision making, I reviewed the following data within the [facility] notes reviewed and incorporated, Labs reviewed, notes from prior ED visits and [place] Controlled Substance Database    ____________________________________________   FINAL CLINICAL IMPRESSION(S) / ED DIAGNOSES  Final diagnoses:  Sepsis with acute hypoxic respiratory failure, due to unspecified organism, unspecified whether septic shock present ([place])      NEW MEDICATIONS STARTED DURING THIS VISIT:  New Prescriptions   No medications on file     Note:  This document was prepared using Dragon voice recognition software and may include unintentional dictation errors.    Merlyn Lot, MD 07/27/19 Arnoldo Lenis    Merlyn Lot, MD 07/27/19 [phone]

## 2019-07-27 NOTE — ED Triage Notes (Signed)
Pt with fever of 102 from home. Pt with right sided deficit and garbled speech normal per ems from previous cva. Pt hot to touch, 84% on ra. Pt blinks eyes to speech, but does not respond to questions.

## 2019-07-27 NOTE — ED Notes (Signed)
Pt has removed self from equipment, oxygen. Pt in constant motion with left side, will not follow directions. Sitter to bedside for safety.

## 2019-07-27 NOTE — ED Notes (Signed)
Pt agitated, geodon order received.

## 2019-07-28 ENCOUNTER — Other Ambulatory Visit: Payer: Self-pay

## 2019-07-28 ENCOUNTER — Inpatient Hospital Stay: Payer: PPO

## 2019-07-28 DIAGNOSIS — N400 Enlarged prostate without lower urinary tract symptoms: Secondary | ICD-10-CM | POA: Diagnosis present

## 2019-07-28 DIAGNOSIS — Z9049 Acquired absence of other specified parts of digestive tract: Secondary | ICD-10-CM | POA: Diagnosis not present

## 2019-07-28 DIAGNOSIS — E7800 Pure hypercholesterolemia, unspecified: Secondary | ICD-10-CM | POA: Diagnosis present

## 2019-07-28 DIAGNOSIS — I69354 Hemiplegia and hemiparesis following cerebral infarction affecting left non-dominant side: Secondary | ICD-10-CM | POA: Diagnosis not present

## 2019-07-28 DIAGNOSIS — I2510 Atherosclerotic heart disease of native coronary artery without angina pectoris: Secondary | ICD-10-CM | POA: Diagnosis present

## 2019-07-28 DIAGNOSIS — J9601 Acute respiratory failure with hypoxia: Secondary | ICD-10-CM | POA: Diagnosis present

## 2019-07-28 DIAGNOSIS — Z833 Family history of diabetes mellitus: Secondary | ICD-10-CM | POA: Diagnosis not present

## 2019-07-28 DIAGNOSIS — Z951 Presence of aortocoronary bypass graft: Secondary | ICD-10-CM | POA: Diagnosis not present

## 2019-07-28 DIAGNOSIS — Z8249 Family history of ischemic heart disease and other diseases of the circulatory system: Secondary | ICD-10-CM | POA: Diagnosis not present

## 2019-07-28 DIAGNOSIS — A419 Sepsis, unspecified organism: Secondary | ICD-10-CM | POA: Diagnosis present

## 2019-07-28 DIAGNOSIS — J9811 Atelectasis: Secondary | ICD-10-CM | POA: Diagnosis not present

## 2019-07-28 DIAGNOSIS — I252 Old myocardial infarction: Secondary | ICD-10-CM | POA: Diagnosis not present

## 2019-07-28 DIAGNOSIS — Z95828 Presence of other vascular implants and grafts: Secondary | ICD-10-CM | POA: Diagnosis not present

## 2019-07-28 DIAGNOSIS — R451 Restlessness and agitation: Secondary | ICD-10-CM | POA: Diagnosis present

## 2019-07-28 DIAGNOSIS — I712 Thoracic aortic aneurysm, without rupture: Secondary | ICD-10-CM | POA: Diagnosis present

## 2019-07-28 DIAGNOSIS — Z20828 Contact with and (suspected) exposure to other viral communicable diseases: Secondary | ICD-10-CM | POA: Diagnosis present

## 2019-07-28 DIAGNOSIS — R0902 Hypoxemia: Secondary | ICD-10-CM | POA: Diagnosis not present

## 2019-07-28 DIAGNOSIS — J189 Pneumonia, unspecified organism: Secondary | ICD-10-CM | POA: Diagnosis present

## 2019-07-28 DIAGNOSIS — I480 Paroxysmal atrial fibrillation: Secondary | ICD-10-CM | POA: Diagnosis present

## 2019-07-28 DIAGNOSIS — I10 Essential (primary) hypertension: Secondary | ICD-10-CM | POA: Diagnosis present

## 2019-07-28 DIAGNOSIS — H409 Unspecified glaucoma: Secondary | ICD-10-CM | POA: Diagnosis present

## 2019-07-28 DIAGNOSIS — G4730 Sleep apnea, unspecified: Secondary | ICD-10-CM | POA: Diagnosis present

## 2019-07-28 DIAGNOSIS — F0390 Unspecified dementia without behavioral disturbance: Secondary | ICD-10-CM | POA: Diagnosis present

## 2019-07-28 DIAGNOSIS — N433 Hydrocele, unspecified: Secondary | ICD-10-CM | POA: Diagnosis present

## 2019-07-28 DIAGNOSIS — E119 Type 2 diabetes mellitus without complications: Secondary | ICD-10-CM | POA: Diagnosis present

## 2019-07-28 DIAGNOSIS — R4020 Unspecified coma: Secondary | ICD-10-CM | POA: Diagnosis not present

## 2019-07-28 DIAGNOSIS — R4182 Altered mental status, unspecified: Secondary | ICD-10-CM | POA: Diagnosis present

## 2019-07-28 LAB — GLUCOSE, CAPILLARY
Glucose-Capillary: 275 mg/dL — ABNORMAL HIGH (ref 70–99)
Glucose-Capillary: 273 mg/dL — ABNORMAL HIGH (ref 70–99)
Glucose-Capillary: 215 mg/dL — ABNORMAL HIGH (ref 70–99)
Glucose-Capillary: 171 mg/dL — ABNORMAL HIGH (ref 70–99)
Glucose-Capillary: 159 mg/dL — ABNORMAL HIGH (ref 70–99)
Glucose-Capillary: 166 mg/dL — ABNORMAL HIGH (ref 70–99)

## 2019-07-28 LAB — BASIC METABOLIC PANEL
Anion gap: 7 (ref 5–15)
BUN: 13 mg/dL (ref 8–23)
CO2: 27 mmol/L (ref 22–32)
Calcium: 8.4 mg/dL — ABNORMAL LOW (ref 8.9–10.3)
Chloride: 106 mmol/L (ref 98–111)
Creatinine, Ser: 0.87 mg/dL (ref 0.61–1.24)
GFR calc Af Amer: >60 mL/min (ref >60)
GFR calc non Af Amer: >60 mL/min (ref >60)
Glucose, Bld: 259 mg/dL — ABNORMAL HIGH (ref 70–99)
Potassium: 4.2 mmol/L (ref 3.5–5.1)
Sodium: 140 mmol/L (ref 135–145)

## 2019-07-28 LAB — RESPIRATORY PANEL BY PCR

## 2019-07-28 LAB — CBC
HCT: 46.1 % (ref 39.0–52.0)
Hemoglobin: 15.1 g/dL (ref 13.0–17.0)
MCH: 31.9 pg (ref 26.0–34.0)
MCHC: 32.8 g/dL (ref 30.0–36.0)
MCV: 97.3 fL (ref 80.0–100.0)
Platelets: 147 10*3/uL — ABNORMAL LOW (ref 150–400)
RBC: 4.74 MIL/uL (ref 4.22–5.81)
RDW: 11.9 % (ref 11.5–15.5)
WBC: 13.1 10*3/uL — ABNORMAL HIGH (ref 4.0–10.5)
nRBC: 0 % (ref 0.0–0.2)

## 2019-07-28 LAB — PROCALCITONIN: Procalcitonin: 0.57 ng/mL

## 2019-07-28 LAB — SARS CORONAVIRUS 2 BY RT PCR (HOSPITAL ORDER, PERFORMED IN ~~LOC~~ HOSPITAL LAB): SARS Coronavirus 2: NEGATIVE

## 2019-07-28 MED ORDER — APIXABAN 5 MG PO TABS
5.0000 mg | ORAL_TABLET | Freq: Two times a day (BID) | ORAL | Status: DC
  Administered 2019-07-28 – 2019-08-01 (×9): 5 mg via ORAL
  Filled 2019-07-28 (×9): qty 1

## 2019-07-28 MED ORDER — ONDANSETRON HCL 4 MG/2ML IJ SOLN: 4.0000 mg | Freq: Four times a day (QID) | INTRAMUSCULAR | Status: DC | PRN

## 2019-07-28 MED ORDER — SODIUM CHLORIDE 0.9 % IV SOLN
500.0000 mg | INTRAVENOUS | Status: DC
  Administered 2019-07-28 – 2019-07-29 (×2): 500 mg via INTRAVENOUS
  Filled 2019-07-28 (×2): qty 500

## 2019-07-28 MED ORDER — INSULIN ASPART 100 UNIT/ML ~~LOC~~ SOLN
0.0000 [IU] | Freq: Every day | SUBCUTANEOUS | Status: DC
  Administered 2019-07-28: 3 [IU] via SUBCUTANEOUS
  Filled 2019-07-28: qty 1

## 2019-07-28 MED ORDER — ACETAMINOPHEN 325 MG PO TABS
650.0000 mg | ORAL_TABLET | Freq: Four times a day (QID) | ORAL | Status: DC | PRN
  Administered 2019-07-28: 650 mg via ORAL
  Filled 2019-07-28: qty 2

## 2019-07-28 MED ORDER — INSULIN ASPART 100 UNIT/ML ~~LOC~~ SOLN
0.0000 [IU] | Freq: Three times a day (TID) | SUBCUTANEOUS | Status: DC
  Administered 2019-07-28 (×2): 2 [IU] via SUBCUTANEOUS
  Administered 2019-07-28: 3 [IU] via SUBCUTANEOUS
  Administered 2019-07-29: 1 [IU] via SUBCUTANEOUS
  Administered 2019-07-29: 2 [IU] via SUBCUTANEOUS
  Administered 2019-07-29: 3 [IU] via SUBCUTANEOUS
  Administered 2019-07-30: 2 [IU] via SUBCUTANEOUS
  Administered 2019-07-30: 1 [IU] via SUBCUTANEOUS
  Administered 2019-07-30: 2 [IU] via SUBCUTANEOUS
  Administered 2019-07-31: 1 [IU] via SUBCUTANEOUS
  Administered 2019-07-31 – 2019-08-01 (×3): 2 [IU] via SUBCUTANEOUS
  Filled 2019-07-28 (×12): qty 1

## 2019-07-28 MED ORDER — SODIUM CHLORIDE 0.9 % IV SOLN
1.0000 g | INTRAVENOUS | Status: DC
  Administered 2019-07-28 – 2019-07-29 (×2): 1 g via INTRAVENOUS
  Filled 2019-07-28 (×2): qty 1

## 2019-07-28 MED ORDER — ACETAMINOPHEN 650 MG RE SUPP: 650.0000 mg | Freq: Four times a day (QID) | RECTAL | Status: DC | PRN

## 2019-07-28 MED ORDER — HALOPERIDOL LACTATE 5 MG/ML IJ SOLN
2.0000 mg | Freq: Four times a day (QID) | INTRAMUSCULAR | Status: DC | PRN
  Administered 2019-07-28 – 2019-07-31 (×6): 2 mg via INTRAVENOUS
  Filled 2019-07-28 (×6): qty 1

## 2019-07-28 MED ORDER — ONDANSETRON HCL 4 MG PO TABS: 4.0000 mg | ORAL_TABLET | Freq: Four times a day (QID) | ORAL | Status: DC | PRN

## 2019-07-28 MED ORDER — DIVALPROEX SODIUM ER 500 MG PO TB24
750.0000 mg | ORAL_TABLET | Freq: Every day | ORAL | Status: DC
  Administered 2019-07-28 – 2019-07-31 (×4): 750 mg via ORAL
  Filled 2019-07-28 (×5): qty 1

## 2019-07-28 MED ORDER — VANCOMYCIN HCL 1.5 G IV SOLR
1500.0000 mg | Freq: Once | INTRAVENOUS | Status: AC
  Administered 2019-07-28: 1500 mg via INTRAVENOUS
  Filled 2019-07-28: qty 1500

## 2019-07-28 MED ORDER — TRAZODONE HCL 50 MG PO TABS
50.0000 mg | ORAL_TABLET | Freq: Every day | ORAL | Status: DC
  Administered 2019-07-29 – 2019-07-31 (×3): 50 mg via ORAL
  Filled 2019-07-28 (×4): qty 1

## 2019-07-28 NOTE — ED Notes (Signed)
Waiting on vancomycin from pharmacy

## 2019-07-28 NOTE — ED Notes (Signed)
Glasses, shirt and pants sent to floor with pt.

## 2019-07-28 NOTE — Progress Notes (Addendum)
Patient admitted early this morning.  Seen and examined by me later this morning.  Please see H&P for further details.  Patient continues to be altered this morning.  He is not really answering questions.  On exam, he is sleepy and mumbling.  RRR.  Lungs are clear to auscultation bilaterally.  No obvious abdominal tenderness to palpation.  He is on room air.  Sepsis secondary to community-acquired pneumonia.  Meeting sepsis criteria on admission with fever, tachycardia, and leukocytosis.  UA is unremarkable. -Continue ceftriaxone and azithromycin -Check procalcitonin -Repeat two-view chest x-ray -Blood cultures are pending -Check RVP  Altered mental status- patient remains confused this morning. -One-to-one sitter at bedside -Haldol IV as needed agitation -Check CT head -Check TSH, ammonia, HIV, RPR, vitamin B12, folate, UDS to rule out other etiologies  Remainder of plan per H&P.  Attempted to call patient's wife, [name], but there was no answer. Unable to leave a voicemail.  Hyman Bible, MD

## 2019-07-28 NOTE — ED Notes (Signed)
PT'S DAUGHTER DEBBIE CAN BE REACHED AT [phone]

## 2019-07-28 NOTE — Progress Notes (Signed)
PHARMACY -  BRIEF ANTIBIOTIC NOTE   Pharmacy has received consult(s) for Vancomycin and Cefepime from an ED provider.  The patient's profile has been reviewed for ht/wt/allergies/indication/available labs.    One time order(s) placed for Vancomycin 1500mg  and Cefepime 2gm  Further antibiotics/pharmacy consults should be ordered by admitting physician if indicated.                       Thank you, Hart Robinsons A 07/28/2019  12:02 AM

## 2019-07-28 NOTE — TOC Initial Note (Signed)
Transition of Care [facility]) - Initial/Assessment Note    Patient Details  Name: Kyle Richardson MRN: [id number] Date of Birth: 05/13/1928  [facility]) CM/SW Contact:    Su Hilt, RN Phone Number: 07/28/2019, 2:43 PM  Clinical Narrative:                 Spoke with the daughter Kyle Richardson The patient lives at home with his wife and daughter, he has a 24 hour sitter with [employer], He has had [facility] in the past and did very well with it per Kyle Richardson, she will find out which company they used and let me know, she would like for the patient to be able to have [facility] again when he discharges.   The patient does not use oxygen at home He has transportation provided by the family, He does not need additional equipment Will continue to monitor for needs Expected Discharge Plan: [place] Barriers to Discharge: Continued Medical Work up   Patient Goals and CMS Choice Patient states their goals for this hospitalization and ongoing recovery are:: DAughter would like to have patient get well and go home CMS Medicare.gov Compare Post Acute Care list provided to:: Patient    Expected Discharge Plan and Services Expected Discharge Plan: [place]   Discharge Planning Services: CM Consult                     DME Arranged: N/A                    Prior Living Arrangements/Services   Lives with:: Spouse, Adult Children Patient language and need for interpreter reviewed:: No Do you feel safe going back to the place where you live?: Yes      Need for Family Participation in Patient Care: Yes (Comment) Care giver support system in place?: Yes (comment) Current home services: DME, Sitter(Home Hospital bed, Lift, RW, BSC, Grabber,    [place]) Criminal Activity/Legal Involvement Pertinent to Current Situation/Hospitalization: No - Comment as needed  Activities of Daily Living   ADL Screening (condition at time of  admission) Patient's cognitive ability adequate to safely complete daily activities?: No Is the patient deaf or have difficulty hearing?: Yes Does the patient have difficulty seeing, even when wearing glasses/contacts?: Yes Does the patient have difficulty concentrating, remembering, or making decisions?: Yes Patient able to express need for assistance with ADLs?: No Does the patient have difficulty dressing or bathing?: Yes Independently performs ADLs?: No Communication: Needs assistance Is this a change from baseline?: Pre-admission baseline Dressing (OT): Needs assistance Is this a change from baseline?: Pre-admission baseline Grooming: Needs assistance Is this a change from baseline?: Pre-admission baseline Feeding: Needs assistance Is this a change from baseline?: Pre-admission baseline Bathing: Needs assistance Is this a change from baseline?: Pre-admission baseline Toileting: Needs assistance Is this a change from baseline?: Pre-admission baseline  Permission Sought/Granted   Permission granted to share information with : Yes, Verbal Permission Granted              Emotional Assessment Appearance:: Appears stated age Attitude/Demeanor/Rapport: Unable to Assess Affect (typically observed): Appropriate Orientation: : Oriented to Self Alcohol / Substance Use: Not Applicable Psych Involvement: No (comment)  Admission diagnosis:  Sepsis with acute hypoxic respiratory failure, due to unspecified organism, unspecified whether septic shock present ([place]) [A41.9, R65.20, J96.01] Sepsis ([place]) [A41.9] Patient Active Problem List   Diagnosis Date Noted  .  Pneumonia 07/28/2019  . Sepsis ([place]) 04/02/2019  . Acute encephalopathy 03/28/2019  . Diarrhea 03/09/2019  . Agitation 02/02/2019  . Stroke (cerebrum) ([employer]) 10/11/2018  . Ureteral stone 08/25/2018  . Hydroureteronephrosis 08/05/2018  . Rotator cuff tendinitis, right 07/22/2018  . CVA, old, dysarthria 06/04/2018  .  Hemiparesis affecting dominant side as late effect of cerebrovascular accident ([place]) 05/27/2018  . Encephalopathy   . Carotid stenosis, symptomatic, with infarction ([place]) 05/22/2018  . TIA (transient ischemic attack) 04/15/2018  . Episode of confusion 04/08/2018  . Labile blood pressure   . Acute ischemic left PCA stroke ([place]) 02/27/2018  . Diabetes mellitus type 2 in nonobese (HCC)   . Glaucoma   . MG, ocular (myasthenia gravis) ([place])   . History of CVA (cerebrovascular accident) 02/24/2018  . Right homonymous hemianopsia 02/23/2018  . Unresponsive episode 02/22/2018  . UTI (urinary tract infection) 01/28/2018  . Abnormal chest CT 09/20/2017  . Double vision 01/21/2017  . Ascending aortic aneurysm ([place]) 12/22/2016  . Blood-tinged sputum 10/23/2016  . Cough 10/23/2016  . PAF (paroxysmal atrial fibrillation) ([place]) 09/14/2016  . Health care maintenance 12/26/2014  . Stress 07/05/2014  . Apnea, sleep 06/18/2014  . Migraine 05/10/2013  . Calculus of kidney 01/01/2013  . Hypertension 08/20/2012  . Hypercholesterolemia 08/20/2012  . CAD (coronary artery disease) 08/20/2012  . Benign localized hyperplasia of prostate without urinary obstruction and other lower urinary tract symptoms (LUTS) 02/28/2012   PCP:  Einar Pheasant, MD Pharmacy:   [place], [place] - [address] [place] [address] Phone: [phone] Fax: [phone]     Social Determinants of Health (SDOH) Interventions    Readmission Risk Interventions No flowsheet data found.

## 2019-07-28 NOTE — TOC Progression Note (Signed)
Transition of Care [facility]) - Progression Note    Patient Details  Name: Kyle Richardson MRN: [id number] Date of Birth: [birth year]  Transition [facility] Care [facility]) CM/SW [place], RN Phone Number: 07/28/2019, 3:01 PM  Clinical Narrative:     Daughter Debbie called and stated that she found out from her mom that it was in fact [facility] that the patient has used.  They would like to use them again as their insurance is INN with them.  I explained once PT makes the recommendation I would get it set up  Expected Discharge Plan: [place] Barriers to Discharge: Continued Medical Work up  Expected Discharge Plan and Services Expected Discharge Plan: [place]   Discharge Planning Services: CM Consult                     DME Arranged: N/A                     Social Determinants of Health (SDOH) Interventions    Readmission Risk Interventions No flowsheet data found.

## 2019-07-28 NOTE — ED Notes (Signed)
Pt sleeping. 

## 2019-07-28 NOTE — ED Notes (Signed)
Pt hooked back up to monitor, leads placed in posterior position to prevent pt from pulling leads off and pulseox is wrapped around pt big toe, RN notified.

## 2019-07-28 NOTE — ED Notes (Signed)
Sitter at side. Pt much calmer, Dr. Jannifer Franklin notified pt will need sitter order for floor. MD verbalizes understanding.

## 2019-07-28 NOTE — H&P (Signed)
[name] NAME: Kyle Richardson    MR#:  [id number]  DATE OF BIRTH:  07/15/1928  DATE OF ADMISSION:  07/27/2019  PRIMARY CARE PHYSICIAN: Einar Pheasant, MD   REQUESTING/REFERRING PHYSICIAN: Quentin Cornwall, MD  CHIEF COMPLAINT:   Chief Complaint  Patient presents with  . Code Sepsis    HISTORY OF PRESENT ILLNESS:  Kyle Richardson  is a 85 y.o. male who presents with chief complaint as above.  Patient presents to the ED with agitation and altered mental status.  Per family's report this is been getting worse over the last day or so.  Here he is found to meet sepsis criteria.  There is suspicion for pneumonia given his hypoxia.  X-ray does not read as overt pneumonia, though imaging quality is not the best given low lung volumes.  He was treated per sepsis protocol and hospitalist were called for admission.  Of note, patient is unable to contribute significant information to his HPI given his confusion  PAST MEDICAL HISTORY:   Past Medical History:  Diagnosis Date  . Abdominal fibromatosis   . 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  s/p PTCA of the mid RCA 1/96, also  s/p  CABG x 4 7/96  . Complication of anesthesia    confusion and hallucinations for several days after receiving Versed  . Degenerative disc disease   . Dementia ([place])   . Diabetes mellitus ([place])   . Glaucoma   . Hypercholesterolemia   . Hypertension   . Migraine headache    history of  . Myocardial infarction ([place]) 1996  . Osteoarthritis   . Sleep apnea   . Stroke ([place]) 02/22/2017   affected left side      PAST SURGICAL HISTORY:   Past Surgical History:  Procedure Laterality Date  . APPENDECTOMY    . CAROTID ANGIOGRAPHY Bilateral 04/19/2018   Procedure: CAROTID ANGIOGRAPHY;  Surgeon: Algernon Huxley, MD;  Location: [facility];  Service: Cardiovascular;  Laterality: Bilateral;  . CAROTID PTA/STENT  INTERVENTION Right 05/22/2018   Procedure: CAROTID PTA/STENT INTERVENTION;  Surgeon: Katha Cabal, MD;  Location: [place] [facility];  Service: Cardiovascular;  Laterality: Right;  . CATARACT EXTRACTION W/ INTRAOCULAR LENS  IMPLANT, BILATERAL    . CHOLECYSTECTOMY     Removed  . CORONARY ARTERY BYPASS GRAFT  7/96   x4  . HEMORRHOID SURGERY    . HERNIA REPAIR    . TONSILLECTOMY    . UPP and tonsillectomy  1/86     SOCIAL HISTORY:   Social History   Tobacco Use  . Smoking status: Never Smoker  . Smokeless tobacco: Never Used  Substance Use Topics  . Alcohol use: No    Alcohol/week: 0.0 standard drinks     FAMILY HISTORY:   Family History  Problem Relation Age of Onset  . Heart disease Mother        died age 20  . Heart disease Father        and other vascular disease  . Diabetes Other        four siblings  . Heart disease Brother        s/p CABG  . Colon cancer Neg Hx   . Prostate cancer Neg Hx      DRUG ALLERGIES:   Allergies  Allergen Reactions  . Fentanyl Other (See Comments)    Reaction: confusion  . Flomax [  Tamsulosin Hcl] Other (See Comments)    Unknown  . Lamisil [Terbinafine] Other (See Comments)    Unknown  . Levofloxacin Other (See Comments)    Diplopia. NEVER put patient on levoquin!  . Lorazepam Other (See Comments)    Reaction: confusion/ hallucinations   . Metformin And Related Diarrhea  . Sertraline Other (See Comments)    Unknown  . Sulfa Antibiotics Other (See Comments)    Unknown  . Versed [Midazolam] Other (See Comments)    Hallucinations and confusion  . Clarithromycin Diarrhea  . Codeine Other (See Comments)    Unknown    MEDICATIONS AT HOME:   Prior to Admission medications   Medication Sig Start Date End Date Taking? Authorizing Provider  albuterol (PROVENTIL HFA;VENTOLIN HFA) 108 (90 Base) MCG/ACT inhaler Inhale 2 puffs into the lungs every 6 (six) hours as needed for wheezing or shortness of breath. Patient not  taking: Reported on 03/28/2019 12/13/18   Jodelle Green, FNP  ALPRAZolam Duanne Moron) 0.25 MG tablet Take 0.25 mg by mouth 2 (two) times daily as needed for anxiety or sleep.     [provider]  apixaban (ELIQUIS) 5 MG TABS tablet Take 1 tablet (5 mg total) by mouth 2 (two) times daily. 04/01/18   Angiulli, Lavon Paganini, PA-C  aspirin EC 81 MG EC tablet Take 1 tablet (81 mg total) by mouth daily. 06/01/18   Schnier, Dolores Lory, MD  atorvastatin (LIPITOR) 40 MG tablet TAKE ONE TABLET BY MOUTH AT BEDTIME 05/30/19   Einar Pheasant, MD  divalproex (DEPAKOTE ER) 250 MG 24 hr tablet TAKE 3 TABLETS BY MOUTH EVERY NIGHT AT BEDTIME. Patient taking differently: No sig reported 04/14/19   Einar Pheasant, MD  fluticasone [facility]) 50 MCG/ACT nasal spray Place 2 sprays into both nostrils daily. 12/13/18   Guse, Jacquelynn Cree, FNP  latanoprost (XALATAN) 0.005 % ophthalmic solution Place 1 drop into both eyes at bedtime.    [provider]  loratadine (CLARITIN) 10 MG tablet Take 10 mg by mouth daily.    [provider]  MELATONIN ER PO Take 1 tablet by mouth at bedtime.     [provider]  metoprolol succinate (TOPROL-XL) 25 MG 24 hr tablet Take 0.5 tablets (12.5 mg total) by mouth daily. 04/22/18   Saundra Shelling, MD  nitroGLYCERIN (NITROSTAT) 0.4 MG SL tablet Place 0.4 mg under the tongue every 5 (five) minutes as needed for chest pain.     [provider]  tiZANidine (ZANAFLEX) 4 MG tablet Take 1 tablet (4 mg total) by mouth daily as needed for muscle spasms. 04/09/19   Einar Pheasant, MD  traZODone (DESYREL) 50 MG tablet Take 50 mg by mouth at bedtime.     [provider]    REVIEW OF SYSTEMS:  Review of Systems  Unable to perform ROS: Acuity of condition     VITAL SIGNS:   Vitals:   07/27/19 2245 07/27/19 2300 07/27/19 2330 07/27/19 2345  BP:   101/60   Pulse: 71 (!) 104  85  Resp:  15    Temp:      TempSrc:      SpO2: 100% 93%  100%  Weight:       Wt Readings  from Last 3 Encounters:  07/27/19 77.1 kg  03/28/19 77.1 kg  10/28/18 72.6 kg    PHYSICAL EXAMINATION:  Physical Exam  Vitals reviewed. Constitutional: He appears well-developed and well-nourished. No distress.  HENT:  Head: Normocephalic and atraumatic.  Mouth/Throat: Oropharynx  is clear and moist.  Eyes: Pupils are equal, round, and reactive to light. Conjunctivae and EOM are normal. No scleral icterus.  Neck: Normal range of motion. Neck supple. No JVD present. No thyromegaly present.  Cardiovascular: Regular rhythm and intact distal pulses. Exam reveals no gallop and no friction rub.  No murmur heard. Tachycardic  Respiratory: Effort normal. No respiratory distress. He has no wheezes. He has no rales.  Coarse basilar breath sounds  GI: Soft. Bowel sounds are normal. He exhibits no distension. There is no abdominal tenderness.  Musculoskeletal: Normal range of motion.        General: No edema.     Comments: No arthritis, no gout  Lymphadenopathy:    He has no cervical adenopathy.  Neurological:  Unable to fully assess due to patient's altered mental status  Skin: Skin is warm and dry. No rash noted. No erythema.  Psychiatric:  Unable to assess due to patient condition    LABORATORY PANEL:   CBC Recent Labs  Lab 07/27/19 2159  WBC 14.2*  HGB 16.7  HCT 49.7  PLT 160   ------------------------------------------------------------------------------------------------------------------  Chemistries  Recent Labs  Lab 07/27/19 2159  NA 137  K 4.0  CL 100  CO2 23  GLUCOSE 354*  BUN 14  CREATININE 0.96  CALCIUM 8.9  AST 17  ALT 27  ALKPHOS 83  BILITOT 1.3*   ------------------------------------------------------------------------------------------------------------------  Cardiac Enzymes No results for input(s): TROPONINI in the last 168  hours. ------------------------------------------------------------------------------------------------------------------  RADIOLOGY:  Dg Chest 1 View  Result Date: 07/27/2019 CLINICAL DATA:  85 year old male with fever. EXAM: CHEST  1 VIEW COMPARISON:  Chest radiograph dated 03/28/2019 FINDINGS: There is shallow inspiration with minimal bibasilar atelectasis. No focal consolidation, pleural effusion, or pneumothorax. Stable cardiac silhouette. Median sternotomy wires and CABG vascular clips. No acute osseous pathology. IMPRESSION: No active cardiopulmonary disease. Electronically Signed   By: Anner Crete M.D.   On: 07/27/2019 22:31    EKG:   Orders placed or performed during the hospital encounter of 07/27/19  . EKG 12-Lead  . EKG 12-Lead    IMPRESSION AND PLAN:  Principal Problem:   Sepsis ([place]) -lactic acid was within normal limits, blood pressure stable, antibiotics given, culture sent, sepsis is due to likely pneumonia Active Problems:   Pneumonia -IV antibiotics given, as needed supportive treatment.  Patient was somewhat hypoxic requiring supplemental oxygen   Hypertension -home dose antihypertensives   CAD (coronary artery disease) -continue home meds   PAF (paroxysmal atrial fibrillation) (HCC) -continue home medications including anticoagulation   Diabetes mellitus type 2 in nonobese (HCC) -sliding scale insulin coverage  Chart review performed and case discussed with ED provider. Labs, imaging and/or ECG reviewed by provider and discussed with patient/family. Management plans discussed with the patient and/or family.  COVID-19 status: Pending  DVT PROPHYLAXIS: Systemic anticoagulation  GI PROPHYLAXIS:  None  ADMISSION STATUS: Inpatient     CODE STATUS: Full Code Status History    Date Active Date Inactive Code Status Order ID Comments User Context   03/28/2019 1713 03/29/2019 1534 Full Code [id number]  Dustin Flock, MD Inpatient   10/10/2018 0254 10/12/2018 1951  Full Code [id number]  Arta Silence, MD Inpatient   05/22/2018 1004 05/31/2018 2014 Full Code [id number]  Katha Cabal, MD Inpatient   04/15/2018 2120 04/22/2018 2032 Full Code [id number]  Vaughan Basta, MD Inpatient   02/27/2018 1544 04/01/2018 1415 Full Code [id number]  Flora Lipps Inpatient   02/27/2018 1544 02/27/2018 1544 Full  Code [id number]  Flora Lipps Inpatient   02/23/2018 0018 02/27/2018 1528 Full Code [id number]  Lance Coon, MD Inpatient   Advance Care Planning Activity      TOTAL TIME TAKING CARE OF THIS PATIENT: 45 minutes.   This patient was evaluated in the context of the global COVID-19 pandemic, which necessitated consideration that the patient might be at risk for infection with the SARS-CoV-2 virus that causes COVID-19. Institutional protocols and algorithms that pertain to the evaluation of patients at risk for COVID-19 are in a state of rapid change based on information released by regulatory bodies including the CDC and federal and state organizations. These policies and algorithms were followed to the best of this provider's knowledge to date during the patient's care at this facility.  Ethlyn Daniels 07/28/2019, 12:05 AM  [facility]  Office  [phone]  CC: Primary care physician; Einar Pheasant, MD  Note:  This document was prepared using Dragon voice recognition software and may include unintentional dictation errors.

## 2019-07-28 NOTE — ED Notes (Signed)
ED TO INPATIENT HANDOFF REPORT  ED Nurse Name and Phone #: Pharaoh Pio [phone]   S Name/Age/Gender Kyle Richardson 85 y.o. male Room/Bed: ED05A/ED05A  Code Status   Code Status: Full Code  Home/SNF/Other Home Patient oriented to: self Is this baseline? Yes   Triage Complete: Triage complete  Chief Complaint fever  Triage Note Pt with fever of 102 from home. Pt with right sided deficit and garbled speech normal per ems from previous cva. Pt hot to touch, 84% on ra. Pt blinks eyes to speech, but does not respond to questions.   Allergies Allergies  Allergen Reactions  . Fentanyl Other (See Comments)    Reaction: confusion  . Flomax [Tamsulosin Hcl] Other (See Comments)    Unknown  . Lamisil [Terbinafine] Other (See Comments)    Unknown  . Levofloxacin Other (See Comments)    Diplopia. NEVER put patient on levoquin!  . Lorazepam Other (See Comments)    Reaction: confusion/ hallucinations   . Metformin And Related Diarrhea  . Sertraline Other (See Comments)    Unknown  . Sulfa Antibiotics Other (See Comments)    Unknown  . Versed [Midazolam] Other (See Comments)    Hallucinations and confusion  . Clarithromycin Diarrhea  . Codeine Other (See Comments)    Unknown    Level of Care/Admitting Diagnosis ED Disposition    ED Disposition Condition [facility] Area: [place] [[id number]]  Level of Care: Med-Surg [16]  Covid Evaluation: Confirmed COVID Negative  [facility]: [id number]  Admitting Physician: Lance Coon [id number]  Attending Physician: Lance Coon [phone]  Estimated length of stay: past midnight tomorrow  Certification:: I certify this patient will need inpatient services for at least 2 midnights  PT Class (Do Not Modify): Inpatient [101]  PT Acc Code (Do Not Modify): Private [1]       B Medical/Surgery History Past Medical History:  Diagnosis Date  . Abdominal fibromatosis   . CAD (coronary artery  disease)    s/p inferior MI with RV involvement 1/96.  s/p PTCA of the mid RCA 1/96, also  s/p  CABG x 4 7/96  . Complication of anesthesia    confusion and hallucinations for several days after receiving Versed  . Degenerative disc disease   . Dementia ([place])   . Diabetes mellitus ([place])   . Glaucoma   . Hypercholesterolemia   . Hypertension   . Migraine headache    history of  . Myocardial infarction ([place]) 1996  . Osteoarthritis   . Sleep apnea   . Stroke [facility]) 02/22/2017   affected left side    Past Surgical History:  Procedure Laterality Date  . APPENDECTOMY    . CAROTID ANGIOGRAPHY Bilateral 04/19/2018   Procedure: CAROTID ANGIOGRAPHY;  Surgeon: Algernon Huxley, MD;  Location: [facility];  Service: Cardiovascular;  Laterality: Bilateral;  . CAROTID PTA/STENT INTERVENTION Right 05/22/2018   Procedure: CAROTID PTA/STENT INTERVENTION;  Surgeon: Katha Cabal, MD;  Location: [facility] [facility];  Service: Cardiovascular;  Laterality: Right;  . CATARACT EXTRACTION W/ INTRAOCULAR LENS  IMPLANT, BILATERAL    . CHOLECYSTECTOMY     Removed  . CORONARY ARTERY BYPASS GRAFT  7/96   x4  . HEMORRHOID SURGERY    . HERNIA REPAIR    . TONSILLECTOMY    . UPP and tonsillectomy  1/86     A IV Location/Drains/Wounds Patient Lines/Drains/Airways Status   Active Line/Drains/Airways    Name:   Placement  date:   Placement time:   Site:   Days:   Peripheral IV 07/27/19 Right Arm   07/27/19    2200    Arm   1   Peripheral IV 07/27/19 Left Arm   07/27/19    2202    Arm   1          Intake/Output Last 24 hours  Intake/Output Summary (Last 24 hours) at 07/28/2019 0303 Last data filed at 07/28/2019 0221 Gross per 24 hour  Intake 1100 ml  Output -  Net 1100 ml    Labs/Imaging Results for orders placed or performed during the hospital encounter of 07/27/19 (from the past 48 hour(s))  Comprehensive metabolic panel     Status: Abnormal   Collection Time: 07/27/19  9:59 PM   Result Value Ref Range   Sodium 137 135 - 145 mmol/L   Potassium 4.0 3.5 - 5.1 mmol/L   Chloride 100 98 - 111 mmol/L   CO2 23 22 - 32 mmol/L   Glucose, Bld 354 (H) 70 - 99 mg/dL   BUN 14 8 - 23 mg/dL   Creatinine, Ser 0.96 0.61 - 1.24 mg/dL   Calcium 8.9 8.9 - 10.3 mg/dL   Total Protein 6.9 6.5 - 8.1 g/dL   Albumin 3.7 3.5 - 5.0 g/dL   AST 17 15 - 41 U/L   ALT 27 0 - 44 U/L   Alkaline Phosphatase 83 38 - 126 U/L   Total Bilirubin 1.3 (H) 0.3 - 1.2 mg/dL   GFR calc non Af Amer >60 >60 mL/min   GFR calc Af Amer >60 >60 mL/min   Anion gap 14 5 - 15    Comment: Performed at [facility], [address] [place], [place] [address]  Lactic acid, plasma     Status: None   Collection Time: 07/27/19  9:59 PM  Result Value Ref Range   Lactic Acid, Venous 1.6 0.5 - 1.9 mmol/L    Comment: Performed at [facility], [address] [place], [place] [address]  CBC with Differential     Status: Abnormal   Collection Time: 07/27/19  9:59 PM  Result Value Ref Range   WBC 14.2 (H) 4.0 - 10.5 K/uL   RBC 5.19 4.22 - 5.81 MIL/uL   Hemoglobin 16.7 13.0 - 17.0 g/dL   HCT 49.7 39.0 - 52.0 %   MCV 95.8 80.0 - 100.0 fL   MCH 32.2 26.0 - 34.0 pg   MCHC 33.6 30.0 - 36.0 g/dL   RDW 12.0 11.5 - 15.5 %   Platelets 160 150 - 400 K/uL   nRBC 0.0 0.0 - 0.2 %   Neutrophils Relative % 82 %   Neutro Abs 11.7 (H) 1.7 - 7.7 K/uL   Lymphocytes Relative 7 %   Lymphs Abs 0.9 0.7 - 4.0 K/uL   Monocytes Relative 10 %   Monocytes Absolute 1.5 (H) 0.1 - 1.0 K/uL   Eosinophils Relative 0 %   Eosinophils Absolute 0.0 0.0 - 0.5 K/uL   Basophils Relative 0 %   Basophils Absolute 0.1 0.0 - 0.1 K/uL   Immature Granulocytes 1 %   Abs Immature Granulocytes 0.07 0.00 - 0.07 K/uL    Comment: Performed at [facility], [address] [place], [place] [address]  Protime-INR     Status: Abnormal   Collection Time: 07/27/19  9:59 PM  Result Value Ref Range   Prothrombin Time 15.5 (H) 11.4 -  15.2 seconds  INR 1.2 0.8 - 1.2    Comment: (NOTE) INR goal varies based on device and disease states. Performed at [facility], [address] [place], [place] [address]   Urinalysis, Complete w Microscopic     Status: Abnormal   Collection Time: 07/27/19  9:59 PM  Result Value Ref Range   Color, Urine YELLOW (A) YELLOW   APPearance CLEAR (A) CLEAR   Specific Gravity, Urine 1.025 1.005 - 1.030   pH 6.0 5.0 - 8.0   Glucose, UA >=500 (A) NEGATIVE mg/dL   Hgb urine dipstick NEGATIVE NEGATIVE   Bilirubin Urine NEGATIVE NEGATIVE   Ketones, ur 20 (A) NEGATIVE mg/dL   Protein, ur 30 (A) NEGATIVE mg/dL   Nitrite NEGATIVE NEGATIVE   Leukocytes,Ua NEGATIVE NEGATIVE   RBC / HPF 0-5 0 - 5 RBC/hpf   WBC, UA 0-5 0 - 5 WBC/hpf   Bacteria, UA NONE SEEN NONE SEEN   Squamous Epithelial / LPF 0-5 0 - 5    Comment: Performed at [facility], [address] [place],  [address]  SARS Coronavirus 2 by RT PCR (hospital order, performed in [place] hospital lab) Nasopharyngeal Nasopharyngeal Swab     Status: None   Collection Time: 07/27/19  9:59 PM   Specimen: Nasopharyngeal Swab  Result Value Ref Range   SARS Coronavirus 2 NEGATIVE NEGATIVE    Comment: (NOTE) If result is NEGATIVE SARS-CoV-2 target nucleic acids are NOT DETECTED. The SARS-CoV-2 RNA is generally detectable in upper and lower  respiratory specimens during the acute phase of infection. The lowest  concentration of SARS-CoV-2 viral copies this assay can detect is 250  copies / mL. A negative result does not preclude SARS-CoV-2 infection  and should not be used as the sole basis for treatment or other  patient management decisions.  A negative result may occur with  improper specimen collection / handling, submission of specimen other  than nasopharyngeal swab, presence of viral mutation(s) within the  areas targeted by this assay, and inadequate number of viral copies  (<250 copies / mL). A negative  result must be combined with clinical  observations, patient history, and epidemiological information. If result is POSITIVE SARS-CoV-2 target nucleic acids are DETECTED. The SARS-CoV-2 RNA is generally detectable in upper and lower  respiratory specimens dur ing the acute phase of infection.  Positive  results are indicative of active infection with SARS-CoV-2.  Clinical  correlation with patient history and other diagnostic information is  necessary to determine patient infection status.  Positive results do  not rule out bacterial infection or co-infection with other viruses. If result is PRESUMPTIVE POSTIVE SARS-CoV-2 nucleic acids MAY BE PRESENT.   A presumptive positive result was obtained on the submitted specimen  and confirmed on repeat testing.  While 2019 novel coronavirus  (SARS-CoV-2) nucleic acids may be present in the submitted sample  additional confirmatory testing may be necessary for epidemiological  and / or clinical management purposes  to differentiate between  SARS-CoV-2 and other Sarbecovirus currently known to infect humans.  If clinically indicated additional testing with an alternate test  methodology [phone]) is advised. The SARS-CoV-2 RNA is generally  detectable in upper and lower respiratory sp ecimens during the acute  phase of infection. The expected result is Negative. Fact Sheet for Patients:  StrictlyIdeas.no Fact Sheet for Healthcare Providers: BankingDealers.co.za This test is not yet approved or cleared by the [place] FDA and has been authorized for detection and/or diagnosis of SARS-CoV-2 by  FDA under an Emergency Use Authorization (EUA).  This EUA will remain in effect (meaning this test can be used) for the duration of the COVID-19 declaration under Section 564(b)(1) of the Act, 21 U.S.C. section 360bbb-3(b)(1), unless the authorization is terminated or revoked sooner. Performed at  [facility], [place], [place] [address]   Glucose, capillary     Status: Abnormal   Collection Time: 07/28/19  1:37 AM  Result Value Ref Range   Glucose-Capillary 275 (H) 70 - 99 mg/dL   Dg Chest 1 View  Result Date: 07/27/2019 CLINICAL DATA:  85 year old male with fever. EXAM: CHEST  1 VIEW COMPARISON:  Chest radiograph dated 03/28/2019 FINDINGS: There is shallow inspiration with minimal bibasilar atelectasis. No focal consolidation, pleural effusion, or pneumothorax. Stable cardiac silhouette. Median sternotomy wires and CABG vascular clips. No acute osseous pathology. IMPRESSION: No active cardiopulmonary disease. Electronically Signed   By: Anner Crete M.D.   On: 07/27/2019 22:31    Pending Labs Unresulted Labs (From admission, onward)    Start     Ordered   07/28/19 [id number]  Basic metabolic panel  Tomorrow morning,   STAT     07/28/19 0100   07/28/19 0500  CBC  Tomorrow morning,   STAT     07/28/19 0100   07/28/19 0101  Hemoglobin A1c  Once,   STAT    Comments: To assess prior glycemic control    07/28/19 0100   07/27/19 2159  Culture, blood (Routine x 2)  BLOOD CULTURE X 2,   STAT     07/27/19 2158          Vitals/Pain Today's Vitals   07/28/19 0130 07/28/19 0200 07/28/19 0230 07/28/19 0245  BP: 133/61 114/80    Pulse:  69 64 66  Resp:  16 (!) 22 (!) 21  Temp:      TempSrc:      SpO2:  99% 99% 97%  Weight:      PainSc:        Isolation Precautions No active isolations  Medications Medications  divalproex (DEPAKOTE ER) 24 hr tablet 750 mg ( Oral Canceled Entry 07/28/19 0144)  apixaban (ELIQUIS) tablet 5 mg ( Oral Canceled Entry 07/28/19 0143)  traZODone (DESYREL) tablet 50 mg ( Oral Canceled Entry 07/28/19 0144)  acetaminophen (TYLENOL) tablet 650 mg (has no administration in time range)    Or  acetaminophen (TYLENOL) suppository 650 mg (has no administration in time range)  ondansetron (ZOFRAN) tablet 4 mg (has no  administration in time range)    Or  ondansetron (ZOFRAN) injection 4 mg (has no administration in time range)  insulin aspart (novoLOG) injection 0-9 Units (has no administration in time range)  insulin aspart (novoLOG) injection 0-5 Units (has no administration in time range)  acetaminophen (TYLENOL) suppository 650 mg (650 mg Rectal Given 07/27/19 2209)  ziprasidone (GEODON) injection 10 mg (10 mg Intramuscular Given 07/27/19 2332)  ceFEPIme (MAXIPIME) 2 g in sodium chloride 0.9 % 100 mL IVPB (0 g Intravenous Stopped 07/28/19 0004)  sodium chloride 0.9 % bolus 500 mL (0 mLs Intravenous Stopped 07/28/19 0100)  vancomycin (VANCOCIN) 1,500 mg in sodium chloride 0.9 % 500 mL IVPB (0 mg Intravenous Stopped 07/28/19 0221)    Mobility walks with person assist Low fall risk   Focused Assessments Cardiac Assessment Handoff:  Cardiac Rhythm: Sinus tachycardia Lab Results  Component Value Date   TROPONINI <0.03 03/28/2019   No results found for: DDIMER Does the Patient currently  have chest pain? No     R Recommendations: See Admitting Provider Note  Report given to:   Additional Notes:

## 2019-07-29 ENCOUNTER — Inpatient Hospital Stay: Payer: PPO

## 2019-07-29 LAB — BASIC METABOLIC PANEL
Anion gap: 8 (ref 5–15)
BUN: 14 mg/dL (ref 8–23)
CO2: 26 mmol/L (ref 22–32)
Calcium: 8.6 mg/dL — ABNORMAL LOW (ref 8.9–10.3)
Chloride: 106 mmol/L (ref 98–111)
Creatinine, Ser: 0.9 mg/dL (ref 0.61–1.24)
GFR calc Af Amer: >60 mL/min (ref >60)
GFR calc non Af Amer: >60 mL/min (ref >60)
Glucose, Bld: 166 mg/dL — ABNORMAL HIGH (ref 70–99)
Potassium: 4.1 mmol/L (ref 3.5–5.1)
Sodium: 140 mmol/L (ref 135–145)

## 2019-07-29 LAB — GLUCOSE, CAPILLARY
Glucose-Capillary: 158 mg/dL — ABNORMAL HIGH (ref 70–99)
Glucose-Capillary: 201 mg/dL — ABNORMAL HIGH (ref 70–99)
Glucose-Capillary: 126 mg/dL — ABNORMAL HIGH (ref 70–99)
Glucose-Capillary: 131 mg/dL — ABNORMAL HIGH (ref 70–99)

## 2019-07-29 LAB — CBC
HCT: 43.8 % (ref 39.0–52.0)
Hemoglobin: 14.6 g/dL (ref 13.0–17.0)
MCH: 32.5 pg (ref 26.0–34.0)
MCHC: 33.3 g/dL (ref 30.0–36.0)
MCV: 97.6 fL (ref 80.0–100.0)
Platelets: 142 K/uL — ABNORMAL LOW (ref 150–400)
RBC: 4.49 MIL/uL (ref 4.22–5.81)
RDW: 12.4 % (ref 11.5–15.5)
WBC: 12.5 K/uL — ABNORMAL HIGH (ref 4.0–10.5)
nRBC: 0 % (ref 0.0–0.2)

## 2019-07-29 LAB — AMMONIA: Ammonia: 19 umol/L (ref 9–35)

## 2019-07-29 LAB — TSH: TSH: 0.776 u[IU]/mL (ref 0.350–4.500)

## 2019-07-29 LAB — VITAMIN B12: Vitamin B-12: 323 pg/mL (ref 180–914)

## 2019-07-29 LAB — HEMOGLOBIN A1C
Hgb A1c MFr Bld: 10.3 % — ABNORMAL HIGH (ref 4.8–5.6)
Mean Plasma Glucose: 249 mg/dL

## 2019-07-29 LAB — SYPHILIS: RPR W/REFLEX TO RPR TITER AND TREPONEMAL ANTIBODIES, TRADITIONAL SCREENING AND DIAGNOSIS ALGORITHM: RPR Ser Ql: NONREACTIVE

## 2019-07-29 LAB — FOLATE: Folate: 18 ng/mL

## 2019-07-29 MED ORDER — NYSTATIN 100000 UNIT/GM EX CREA
TOPICAL_CREAM | Freq: Two times a day (BID) | CUTANEOUS | Status: DC
  Administered 2019-07-29 – 2019-07-31 (×3): via TOPICAL
  Filled 2019-07-29 (×2): qty 15

## 2019-07-29 MED ORDER — LORATADINE 10 MG PO TABS
10.0000 mg | ORAL_TABLET | Freq: Every day | ORAL | Status: DC
  Administered 2019-07-29 – 2019-08-01 (×4): 10 mg via ORAL
  Filled 2019-07-29 (×4): qty 1

## 2019-07-29 MED ORDER — METOPROLOL SUCCINATE ER 25 MG PO TB24
12.5000 mg | ORAL_TABLET | Freq: Every day | ORAL | Status: DC
  Administered 2019-07-29 – 2019-08-01 (×4): 12.5 mg via ORAL
  Filled 2019-07-29 (×4): qty 1

## 2019-07-29 MED ORDER — ALPRAZOLAM 0.25 MG PO TABS
0.2500 mg | ORAL_TABLET | Freq: Every evening | ORAL | Status: DC | PRN
  Administered 2019-07-30 – 2019-08-01 (×3): 0.25 mg via ORAL
  Filled 2019-07-29 (×3): qty 1

## 2019-07-29 MED ORDER — DOXYCYCLINE HYCLATE 100 MG PO TABS
100.0000 mg | ORAL_TABLET | Freq: Two times a day (BID) | ORAL | Status: DC
  Administered 2019-07-29 – 2019-08-01 (×7): 100 mg via ORAL
  Filled 2019-07-29 (×7): qty 1

## 2019-07-29 MED ORDER — ATORVASTATIN CALCIUM 20 MG PO TABS
40.0000 mg | ORAL_TABLET | Freq: Every day | ORAL | Status: DC
  Administered 2019-07-29 – 2019-07-31 (×3): 40 mg via ORAL
  Filled 2019-07-29 (×3): qty 2

## 2019-07-29 MED ORDER — MELATONIN 5 MG PO TABS
5.0000 mg | ORAL_TABLET | Freq: Every day | ORAL | Status: DC
  Administered 2019-07-29 – 2019-07-31 (×3): 5 mg via ORAL
  Filled 2019-07-29 (×4): qty 1

## 2019-07-29 MED ORDER — FLUTICASONE PROPIONATE 50 MCG/ACT NA SUSP
2.0000 | Freq: Every day | NASAL | Status: DC
  Administered 2019-07-29 – 2019-07-31 (×3): 2 via NASAL
  Filled 2019-07-29 (×2): qty 16

## 2019-07-29 MED ORDER — LATANOPROST 0.005 % OP SOLN
1.0000 [drp] | Freq: Every day | OPHTHALMIC | Status: DC
  Administered 2019-07-29 – 2019-07-31 (×3): 1 [drp] via OPHTHALMIC
  Filled 2019-07-29: qty 2.5

## 2019-07-29 MED ORDER — ASPIRIN EC 81 MG PO TBEC
81.0000 mg | DELAYED_RELEASE_TABLET | Freq: Every day | ORAL | Status: DC
  Administered 2019-07-29 – 2019-08-01 (×4): 81 mg via ORAL
  Filled 2019-07-29 (×4): qty 1

## 2019-07-29 MED ORDER — AZITHROMYCIN 500 MG PO TABS: 500.0000 mg | ORAL_TABLET | Freq: Every day | ORAL | Status: DC

## 2019-07-29 NOTE — Telephone Encounter (Signed)
error 

## 2019-07-29 NOTE — TOC Progression Note (Signed)
Transition of Care [facility]) - Progression Note    Patient Details  Name: Dotson Graser MRN: [id number] Date of Birth: May 15, 1928  [facility] of [facility]) CM/SW Contact  Nikya Busler, Lenice Llamas Phone Number: [phone]  07/29/2019, 9:28 AM  Clinical Narrative: Per Lauretta Chester home health representative they can accept patient for home health PT.      Expected Discharge Plan: [place] Barriers to Discharge: Continued Medical Work up  Expected Discharge Plan and Services Expected Discharge Plan: [place]   Discharge Planning Services: CM Consult                     DME Arranged: N/A                     Social Determinants of Health (SDOH) Interventions    Readmission Risk Interventions No flowsheet data found.

## 2019-07-29 NOTE — Progress Notes (Addendum)
[place] [facility] [place] NAME: Kyle Richardson    MR#:  [id number]  DATE OF BIRTH:  1928/08/03  SUBJECTIVE:   Patient has no concerns today, other than shortness of breath. He denies any chest pain or abdominal pain. He is more alert and less confused today. Per daughter at bedside, he is doing better than yesterday. Daughter is concerned that his testicles appear swollen.  REVIEW OF SYSTEMS:  Review of Systems  Constitutional: Negative for chills and fever.  HENT: Negative for congestion and sore throat.   Eyes: Negative for blurred vision and double vision.  Respiratory: Positive for shortness of breath. Negative for cough.   Cardiovascular: Negative for chest pain and palpitations.  Gastrointestinal: Negative for nausea and vomiting.  Genitourinary: Negative for dysuria and urgency.  Musculoskeletal: Negative for back pain and neck pain.  Neurological: Negative for dizziness and headaches.  Psychiatric/Behavioral: Negative for depression. The patient is not nervous/anxious.     DRUG ALLERGIES:   Allergies  Allergen Reactions  . Fentanyl Other (See Comments)    Reaction: confusion  . Flomax [Tamsulosin Hcl] Other (See Comments)    Unknown  . Lamisil [Terbinafine] Other (See Comments)    Unknown  . Levofloxacin Other (See Comments)    Diplopia. NEVER put patient on levoquin!  . Lorazepam Other (See Comments)    Reaction: confusion/ hallucinations   . Metformin And Related Diarrhea  . Sertraline Other (See Comments)    Unknown  . Sulfa Antibiotics Other (See Comments)    Unknown  . Versed [Midazolam] Other (See Comments)    Hallucinations and confusion  . Clarithromycin Diarrhea  . Codeine Other (See Comments)    Unknown   VITALS:  Blood pressure (!) 103/58, pulse 66, temperature 97.8 F (36.6 C), temperature source Oral, resp. rate 16, weight 77.1 kg, SpO2 96 %. PHYSICAL EXAMINATION:  Physical Exam  GENERAL:  Laying in the  bed with no acute distress.  HEENT: Head atraumatic, normocephalic. Pupils equal, round, reactive to light and accommodation. No scleral icterus. Extraocular muscles intact. Oropharynx and nasopharynx clear.  NECK:  Supple, no jugular venous distention. No thyroid enlargement. LUNGS: +diminished breath sounds in the lung bases bilaterally. Bartlett in place. No use of accessory muscles of respiration.  CARDIOVASCULAR: RRR, S1, S2 normal. No murmurs, rubs, or gallops.  ABDOMEN: Soft, nontender, nondistended. Bowel sounds present.  GENITOURINARY: testicles are swollen and erythematous. No tenderness to palpation. EXTREMITIES: No pedal edema, cyanosis, or clubbing.  NEUROLOGIC: CN 2-12 intact, no focal deficits. 5/5 muscle strength throughout all extremities. Sensation intact throughout. Gait not checked.  PSYCHIATRIC: The patient is alert and oriented x 1. Answering questions appropriately. SKIN: No obvious rash, lesion, or ulcer.  LABORATORY PANEL:  Male CBC Recent Labs  Lab 07/29/19 0529  WBC 12.5*  HGB 14.6  HCT 43.8  PLT 142*   ------------------------------------------------------------------------------------------------------------------ Chemistries  Recent Labs  Lab 07/27/19 2159  07/29/19 0529  NA 137   < > 140  K 4.0   < > 4.1  CL 100   < > 106  CO2 23   < > 26  GLUCOSE 354*   < > 166*  BUN 14   < > 14  CREATININE 0.96   < > 0.90  CALCIUM 8.9   < > 8.6*  AST 17  --   --   ALT 27  --   --   ALKPHOS 83  --   --   BILITOT  1.3*  --   --    < > = values in this interval not displayed.   RADIOLOGY:  US Scrotum W/doppler  Result Date: 07/29/2019 CLINICAL DATA:  Scrotal pain EXAM: SCROTAL ULTRASOUND DOPPLER ULTRASOUND OF THE TESTICLES TECHNIQUE: Complete ultrasound examination of the testicles, epididymis, and other scrotal structures was performed. Color and spectral Doppler ultrasound were also utilized to evaluate blood flow to the testicles. COMPARISON:  None. FINDINGS:  Right testicle Measurements: 4.3 x 1.6 x 2.3 cm. No mass or microlithiasis visualized. Left testicle Measurements: 4.2 x 1.9 x 2.5 cm. No mass or microlithiasis visualized. Right epididymis:  Normal in size and appearance. Left epididymis:  Normal in size and appearance. Hydrocele:  Small left hydrocele is noted. Varicocele:  None visualized. Pulsed Doppler interrogation of both testes demonstrates normal low resistance arterial and venous waveforms bilaterally. IMPRESSION: Small left hydrocele. Normal-appearing testicles. Electronically Signed   By: Inez Catalina M.D.   On: 07/29/2019 14:55   ASSESSMENT AND PLAN:   Sepsis and acute hypoxic respiratory failure secondary to community-acquired pneumonia.  Meeting sepsis criteria on admission with fever, tachycardia, and leukocytosis.  Sepsis is resolving -Switch from ceftriaxone and azithromycin to doxycycline today -Blood cultures with no growth x 2 days -RVP was negative  Altered mental status- improving. Patient is alert and answering questions appropriately. -CT head negative for acute abnormalities -Haldol IV as needed agitation -Check CT head -TSH, ammonia, HIV, RPR, vitamin B12, folate are unremarkable -UDS is pending  Scrotal edema-noted this morning.  No lower extremity edema to suggest undiagnosed CHF. ? Possibly scrotal cellulitis vs irritation. Patient denies any pain. -Check scrotal ultrasound -Doxycycline as above  Hypertension- normotensive -Continue home metoprolol  CAD-stable, no active chest pain. -Continue home aspirin and Lipitor  Paroxysmal atrial fibrillation- in NSR here -Continue home Eliquis and metoprolol  Type 2 diabetes -Continue SSI  All the records are reviewed and case discussed with Care Management/Social Worker. Management plans discussed with the patient, family and they are in agreement.  CODE STATUS: Full Code  TOTAL TIME TAKING CARE OF THIS PATIENT: 35 minutes.   More than 50% of the time was  spent in counseling/coordination of care: YES  POSSIBLE D/C IN 1-2 DAYS, DEPENDING ON CLINICAL CONDITION.   Berna Spare  M.D on 07/29/2019 at 3:09 PM  Between 7am to 6pm - Pager [phone]  After 6pm go to [email or web address] - Proofreader  [facility] [place] [facility]  Office  [phone]  CC: Primary care physician; Einar Pheasant, MD  Note: This dictation was prepared with Dragon dictation along with smaller phrase technology. Any transcriptional errors that result from this process are unintentional.

## 2019-07-29 NOTE — Progress Notes (Signed)
   07/29/19 1500  Clinical Encounter Type  Visited With Family;Patient and family together  Visit Type Initial  Referral From Nurse  Consult/Referral To Chaplain  Recommendations Provide resources for stroke patient/caregiver support  Spiritual Encounters  Spiritual Needs Prayer  Stress Factors  Family Stress Factors Exhausted;Financial concerns;Major life changes   Chaplain visited the patient's daughter (then patient and daughter) during routine rounding. Upon arrival, the patient's daughter Jackelyn Poling) was seated in the recliner. The patient was out of the room for an ultrasound. Chaplain and the patient's daughter engaged in conversation about the patient's history present illness as well as some of the challenging events of the past two years. The patient's daughter is now caregiver for her father/the patient, her mother, and a brother with special needs. The patient's daughter requested any leads on resources/groups that offer support to those who have experienced strokes and their families/caregivers. Specifically, the patient's daughter inquired about help in navigating this time with her father who went from 100% independence to becoming dependent in some newfound ways. This has been a challenging time for her, but she has found blessings in the difficulty. The patient's daughter is a person of [place] and she described the gift of being able to spend more time with her parents and brother, which would not have been possible otherwise. The patient's daughter requested prayer for patience, strength, faith, mercy, and comfort. The patient requested prayer for good health and peace; chaplain obliged. The patient and his daughter expressed gratitude for the spiritual support offered.  Follow-up: Research support available to caregivers and family members of those who have experienced strokes for the patient's daughter. Phone (number in patient's chart) with information gathered.

## 2019-07-30 LAB — CBC
HCT: 42.4 % (ref 39.0–52.0)
Hemoglobin: 14 g/dL (ref 13.0–17.0)
MCH: 32.3 pg (ref 26.0–34.0)
MCHC: 33 g/dL (ref 30.0–36.0)
MCV: 97.7 fL (ref 80.0–100.0)
Platelets: 154 10*3/uL (ref 150–400)
RBC: 4.34 MIL/uL (ref 4.22–5.81)
RDW: 12.3 % (ref 11.5–15.5)
WBC: 9 10*3/uL (ref 4.0–10.5)
nRBC: 0 % (ref 0.0–0.2)

## 2019-07-30 LAB — BASIC METABOLIC PANEL
Anion gap: 9 (ref 5–15)
BUN: 14 mg/dL (ref 8–23)
CO2: 26 mmol/L (ref 22–32)
Calcium: 8.3 mg/dL — ABNORMAL LOW (ref 8.9–10.3)
Chloride: 104 mmol/L (ref 98–111)
Creatinine, Ser: 0.78 mg/dL (ref 0.61–1.24)
GFR calc Af Amer: >60 mL/min (ref >60)
GFR calc non Af Amer: >60 mL/min (ref >60)
Glucose, Bld: 212 mg/dL — ABNORMAL HIGH (ref 70–99)
Potassium: 3.7 mmol/L (ref 3.5–5.1)
Sodium: 139 mmol/L (ref 135–145)

## 2019-07-30 LAB — GLUCOSE, CAPILLARY
Glucose-Capillary: 182 mg/dL — ABNORMAL HIGH (ref 70–99)
Glucose-Capillary: 159 mg/dL — ABNORMAL HIGH (ref 70–99)
Glucose-Capillary: 141 mg/dL — ABNORMAL HIGH (ref 70–99)
Glucose-Capillary: 127 mg/dL — ABNORMAL HIGH (ref 70–99)

## 2019-07-30 LAB — HIV-1 RNA QUANT-NO REFLEX-BLD
HIV 1 RNA Quant: <20 copies/mL
LOG10 HIV-1 RNA: UNDETERMINED log10copy/mL

## 2019-07-30 MED ORDER — LINAGLIPTIN 5 MG PO TABS
5.0000 mg | ORAL_TABLET | Freq: Every day | ORAL | Status: DC
  Administered 2019-07-30 – 2019-08-01 (×3): 5 mg via ORAL
  Filled 2019-07-30 (×3): qty 1

## 2019-07-30 MED ORDER — SENNOSIDES-DOCUSATE SODIUM 8.6-50 MG PO TABS
1.0000 | ORAL_TABLET | Freq: Every day | ORAL | Status: DC
  Administered 2019-07-30: 22:00:00 1 via ORAL
  Filled 2019-07-30: qty 1

## 2019-07-30 NOTE — Progress Notes (Signed)
[name] [place] NAME: Kyle Richardson    MR#:  [id number]  DATE OF BIRTH:  Mar 03, 1928  SUBJECTIVE:   Patient remains a little confused this morning. He is intermittently answering questions. He states denies any testicle pain today.  REVIEW OF SYSTEMS:  Review of Systems  Constitutional: Negative for chills and fever.  HENT: Negative for congestion and sore throat.   Eyes: Negative for blurred vision and double vision.  Respiratory: Positive for shortness of breath. Negative for cough.   Cardiovascular: Negative for chest pain and palpitations.  Gastrointestinal: Negative for nausea and vomiting.  Genitourinary: Negative for dysuria and urgency.  Musculoskeletal: Negative for back pain and neck pain.  Neurological: Negative for dizziness and headaches.  Psychiatric/Behavioral: Negative for depression. The patient is not nervous/anxious.    DRUG ALLERGIES:   Allergies  Allergen Reactions  . Fentanyl Other (See Comments)    Reaction: confusion  . Flomax [Tamsulosin Hcl] Other (See Comments)    Unknown  . Lamisil [Terbinafine] Other (See Comments)    Unknown  . Levofloxacin Other (See Comments)    Diplopia. NEVER put patient on levoquin!  . Lorazepam Other (See Comments)    Reaction: confusion/ hallucinations   . Metformin And Related Diarrhea  . Sertraline Other (See Comments)    Unknown  . Sulfa Antibiotics Other (See Comments)    Unknown  . Versed [Midazolam] Other (See Comments)    Hallucinations and confusion  . Clarithromycin Diarrhea  . Codeine Other (See Comments)    Unknown   VITALS:  Blood pressure 136/80, pulse 86, temperature 98.2 F (36.8 C), temperature source Oral, resp. rate 18, weight 77.1 kg, SpO2 96 %. PHYSICAL EXAMINATION:  Physical Exam  GENERAL:  Laying in the bed with no acute distress.  HEENT: Head atraumatic, normocephalic. Pupils equal, round, reactive to light and accommodation. No scleral  icterus. Extraocular muscles intact. Oropharynx and nasopharynx clear.  NECK:  Supple, no jugular venous distention. No thyroid enlargement. LUNGS: +diminished breath sounds in the lung bases bilaterally. [place] in place. No use of accessory muscles of respiration.  CARDIOVASCULAR: RRR, S1, S2 normal. No murmurs, rubs, or gallops.  ABDOMEN: Soft, nontender, nondistended. Bowel sounds present.  GENITOURINARY: testicles are swollen and erythematous, but less swollen than yesterday. No tenderness to palpation. EXTREMITIES: No pedal edema, cyanosis, or clubbing.  NEUROLOGIC: CN 2-12 intact, no focal deficits. 5/5 muscle strength throughout all extremities. Sensation intact throughout. Gait not checked.  PSYCHIATRIC: The patient is alert and oriented x 1. Answering questions appropriately. SKIN: No obvious rash, lesion, or ulcer.  LABORATORY PANEL:  Male CBC Recent Labs  Lab 07/30/19 0508  WBC 9.0  HGB 14.0  HCT 42.4  PLT 154   ------------------------------------------------------------------------------------------------------------------ Chemistries  Recent Labs  Lab 07/27/19 2159  07/30/19 0508  NA 137   < > 139  K 4.0   < > 3.7  CL 100   < > 104  CO2 23   < > 26  GLUCOSE 354*   < > 212*  BUN 14   < > 14  CREATININE 0.96   < > 0.78  CALCIUM 8.9   < > 8.3*  AST 17  --   --   ALT 27  --   --   ALKPHOS 83  --   --   BILITOT 1.3*  --   --    < > = values in this interval not displayed.   RADIOLOGY:  No results found. ASSESSMENT AND PLAN:   Acute hypoxic respiratory failure secondary to community-acquired pneumonia.  Initially meeting sepsis criteria with fever, tachycardia, and leukocytosis.  Sepsis is resolving. Currently on 2L O2. -Continue doxycycline -Blood cultures with no growth x 2 days -RVP was negative -Wean O2 as able -PT recommending SNF  Altered mental status- improving. Patient has some underlying dementia at baseline. -CT head negative for acute  abnormalities -Haldol IV as needed agitation -TSH, ammonia, HIV, RPR, vitamin B12, folate are unremarkable  Scrotal edema- swelling seems a little better this morning. ?Possibly scrotal cellulitis vs irritation. Patient denies any pain. -Scrotal US with small left hydrocele -Doxycycline as above -Will also add some nystatin to see if this helps  Hypertension- normotensive -Continue home metoprolol  CAD-stable, no active chest pain. -Continue home aspirin and Lipitor  Paroxysmal atrial fibrillation- in NSR here -Continue home Eliquis and metoprolol  Type 2 diabetes -Continue SSI  Called wife, [name], and updated her over the phone.  All the records are reviewed and case discussed with Care Management/Social Worker. Management plans discussed with the patient, family and they are in agreement.  CODE STATUS: Full Code  TOTAL TIME TAKING CARE OF THIS PATIENT: 40 minutes.   More than 50% of the time was spent in counseling/coordination of care: YES  POSSIBLE D/C IN 1-2 DAYS, DEPENDING ON CLINICAL CONDITION.   Berna Spare Yury Schaus M.D on 07/30/2019 at 2:51 PM  Between 7am to 6pm - Pager - [phone]  After 6pm go to [email or web address] - Proofreader  [facility]  Office  [phone]  CC: Primary care physician; Einar Pheasant, MD  Note: This dictation was prepared with Dragon dictation along with smaller phrase technology. Any transcriptional errors that result from this process are unintentional.

## 2019-07-30 NOTE — Evaluation (Signed)
Physical Therapy Evaluation Patient Details Name: Kyle Richardson MRN: [id number] DOB: 1928/01/03 Today's Date: 07/30/2019   History of Present Illness  PT is 85 yo male that presented to ED for AMS, workup positive for PNA and acute respiratory failure as well as sepsis. 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    Clinical Impression  Per chart and RN/CNA/sitter, pt mostly dependent for ADLs at home, has 24 hr sitter, but may have been able to previously transfer to [facility].  Patient in bed, able to report his name and birthday, disoriented to time/place/situation. Intermittent ability follow commands, mostly easily re-directed, difficulty with eye contact. Most UE and LE movements observed during session, pt able to freely move LUE and LLE, perform SLR/heel slide without physical assist. Pt unable to lift RLE or bend R knee without assist, difficulty assessing presence of tone due to mental status. Bed mobility with maxAx2, and patient was able to sit EOB for several minutes. Initially modA to maintain balance, but with repositioning, feet on the floor and verbal cues pt intermittently supervision to maintain balance. Sit <> stand attempted twice, modAx2, PT block of R knee necessary to prevent buckling, pt unable to lift RLE independently to step towards head of bed. Pt repositioned and in NAD at end of session.  Overall the patient demonstrated deficits (see "PT Problem List") that impede the patient's functional abilities, safety, and mobility and would benefit from skilled PT intervention. Recommendation is STR to maximize mobility/safety.       Follow Up Recommendations SNF    Equipment Recommendations  Other (comment)(TBD)    Recommendations  for Other Services       Precautions / Restrictions Precautions Precautions: Fall Restrictions Weight Bearing Restrictions: No      Mobility  Bed Mobility Overal bed mobility: Needs Assistance Bed Mobility: Supine to Sit;Sit to Supine     Supine to sit: Max assist;+2 for physical assistance Sit to supine: Max assist;+2 for physical assistance   General bed mobility comments: difficulty assessing patients ability to participate due to AMS  Transfers Overall transfer level: Needs assistance Equipment used: 2 person hand held assist Transfers: Sit to/from Stand Sit to Stand: Mod assist;+2 physical assistance         General transfer comment: PT attempts to block R knee to improve stability. Unable to lift RLE and place safely independently at this time  Ambulation/Gait             General Gait Details: deferred  Stairs            Wheelchair Mobility    Modified Rankin (Stroke Patients Only)       Balance Overall balance assessment: Needs assistance Sitting-balance support: Feet supported   Sitting balance - Comments: pt variable in ability to maintain balance. Occasionaly R lean noted, occasional supervision to maintain balance, initially modA     Standing balance-Leahy Scale: Zero                               Pertinent Vitals/Pain Pain Assessment: Faces Faces Pain Scale: No hurt    Home Living Family/patient expects to be discharged to:: Private residence Living Arrangements: Spouse/significant other Available Help at Discharge: Family;Available 24 hours/day;Personal care attendant(per chart patient has a  sitter/CNA 24 hrs a day) Type of Home: House Home Access: Ramped entrance     Home Layout: One level Home Equipment: Grab bars - tub/shower;Wheelchair - [employer] - 2 wheels;Bedside commode;Tub bench;Hand held shower head;Grab bars - toilet;Toilet riser      Prior Function           Comments: PLOF information  gathered from previous hospital admissions. Per RN/CNA, pt was able to transfer to [facility], unclear level of assistance needed     Hand Dominance   Dominant Hand: Right    Extremity/Trunk Assessment   Upper Extremity Assessment Upper Extremity Assessment: Difficult to assess due to impaired cognition;RUE deficits/detail;LUE deficits/detail RUE Deficits / Details: very minimal active ability to abduct arm from side, AAROM for all other motions, able to move R hand minimally LUE Deficits / Details: able to move freely    Lower Extremity Assessment Lower Extremity Assessment: Difficult to assess due to impaired cognition;RLE deficits/detail;LLE deficits/detail RLE Deficits / Details: pt unable to independently lift RLE, or bend knee, difficult to assess presence of tone LLE Deficits / Details: able to perform SLR, heel slide, intermittent ability to follow commands, most motions observed by PT during session       Communication   Communication: Other (comment)(history of dementia, admitted for AMS)  Cognition Arousal/Alertness: Awake/alert Behavior During Therapy: WFL for tasks assessed/performed Overall Cognitive Status: Within Functional Limits for tasks assessed                                        General Comments      Exercises     Assessment/Plan    PT Assessment Patient needs continued PT services  PT Problem List Decreased strength;Decreased mobility;Decreased safety awareness;Decreased activity tolerance;Decreased balance;Decreased cognition       PT Treatment Interventions DME instruction;Therapeutic exercise;Wheelchair mobility training;Balance training;Neuromuscular re-education;Functional mobility training;Therapeutic activities;Patient/family education    PT Goals (Current goals can be found in the Care Plan section)  Acute Rehab PT Goals PT Goal Formulation: Patient unable to participate in goal setting Time For Goal Achievement:  08/13/19 Potential to Achieve Goals: Fair    Frequency Min 2X/week   Barriers to discharge        Co-evaluation               AM-PAC PT "6 Clicks" Mobility  Outcome Measure Help needed turning from your back to your side while in a flat bed without using bedrails?: Total Help needed moving from lying on your back to sitting on the side of a flat bed without using bedrails?: Total Help needed moving to and from a bed to a chair (including a wheelchair)?: Total Help needed standing up from a chair using your arms (e.g., wheelchair or bedside chair)?: Total Help needed to walk in hospital room?: Total Help needed climbing 3-5 steps with a railing? : Total 6 Click Score: 6    End of Session Equipment Utilized During Treatment: Gait belt Activity Tolerance: Other (comment)(limited by mental status/ability to follow commands) Patient left: in bed;with call bell/phone within reach;with nursing/sitter in room Nurse Communication: Mobility status PT Visit Diagnosis: Other abnormalities of gait and mobility (R26.89);Muscle weakness (generalized) (M62.81);Difficulty in walking, not elsewhere classified (R26.2)    Time: [id number] PT Time Calculation (min) (ACUTE ONLY): 21 min   Charges:   PT Evaluation $PT Eval Low Complexity: 1 Low PT Treatments $Therapeutic Exercise: 8-22 mins  Lieutenant Diego PT, DPT 11:20 AM,07/30/19 [phone]

## 2019-07-30 NOTE — Plan of Care (Signed)

## 2019-07-30 NOTE — TOC Progression Note (Signed)
Transition of Care [facility]) - Progression Note    Patient Details  Name: Kyle Richardson MRN: [id number] Date of Birth: 03-18-1928  [facility]) CM/SW Contact  Kinsley Nicklaus, Lenice Llamas Phone Number: [phone]  07/30/2019, 1:59 PM  Clinical Narrative: Clinical Social Worker (CSW) attempted to contact patient's daughter Kyle Richardson however she did not answer and a voicemail was left. CSW contacted patient's wife Kyle Richardson to discuss D/C plan. Per wife patient is total care at home and does not walk at baseline. Wife reported that patient has difficulty with transfers at baseline and they have a hoyer lift at home. Wife reported that she wants patient to come home with [facility] home health and does not want SNF. Wife requested EMS for transport and confirmed the address is correct listed on the facesheet. Per wife patient has 24/7 caregivers at home through [employer]. CSW will continue to follow and assist as needed.       Expected Discharge Plan: [place] Barriers to Discharge: Continued Medical Work up  Expected Discharge Plan and Services Expected Discharge Plan: [place]   Discharge Planning Services: CM Consult                     DME Arranged: N/A                     Social Determinants of Health (SDOH) Interventions    Readmission Risk Interventions No flowsheet data found.

## 2019-07-31 ENCOUNTER — Inpatient Hospital Stay: Payer: PPO

## 2019-07-31 LAB — GLUCOSE, CAPILLARY
Glucose-Capillary: 146 mg/dL — ABNORMAL HIGH (ref 70–99)
Glucose-Capillary: 169 mg/dL — ABNORMAL HIGH (ref 70–99)
Glucose-Capillary: 199 mg/dL — ABNORMAL HIGH (ref 70–99)
Glucose-Capillary: 157 mg/dL — ABNORMAL HIGH (ref 70–99)

## 2019-07-31 MED ORDER — SENNOSIDES-DOCUSATE SODIUM 8.6-50 MG PO TABS
2.0000 | ORAL_TABLET | Freq: Two times a day (BID) | ORAL | Status: DC
  Administered 2019-07-31 – 2019-08-01 (×3): 2 via ORAL
  Filled 2019-07-31 (×3): qty 2

## 2019-07-31 NOTE — Progress Notes (Signed)
[facility] [place] NAME: Kyle Richardson    MR#:  [id number]  DATE OF BIRTH:  [birth year]  SUBJECTIVE:   Patient remains a little confused this morning. He is intermittently answering questions. He states denies any testicle pain today.  REVIEW OF SYSTEMS:  Review of Systems  Constitutional: Negative for chills and fever.  HENT: Negative for congestion and sore throat.   Eyes: Negative for blurred vision and double vision.  Respiratory: Positive for shortness of breath. Negative for cough.   Cardiovascular: Negative for chest pain and palpitations.  Gastrointestinal: Negative for nausea and vomiting.  Genitourinary: Negative for dysuria and urgency.  Musculoskeletal: Negative for back pain and neck pain.  Neurological: Negative for dizziness and headaches.  Psychiatric/Behavioral: Negative for depression. The patient is not nervous/anxious.    DRUG ALLERGIES:   Allergies  Allergen Reactions  . Fentanyl Other (See Comments)    Reaction: confusion  . Flomax [Tamsulosin Hcl] Other (See Comments)    Unknown  . Lamisil [Terbinafine] Other (See Comments)    Unknown  . Levofloxacin Other (See Comments)    Diplopia. NEVER put patient on levoquin!  . Lorazepam Other (See Comments)    Reaction: confusion/ hallucinations   . Metformin And Related Diarrhea  . Sertraline Other (See Comments)    Unknown  . Sulfa Antibiotics Other (See Comments)    Unknown  . Versed [Midazolam] Other (See Comments)    Hallucinations and confusion  . Clarithromycin Diarrhea  . Codeine Other (See Comments)    Unknown   VITALS:  Blood pressure 136/75, pulse 80, temperature 98.4 F (36.9 C), temperature source Oral, resp. rate 19, weight 77.1 kg, SpO2 94 %. PHYSICAL EXAMINATION:  Physical Exam  GENERAL:  Laying in the bed with no acute distress.  HEENT: Head atraumatic, normocephalic. Pupils equal, round, reactive to light and accommodation. No scleral  icterus. Extraocular muscles intact. Oropharynx and nasopharynx clear.  NECK:  Supple, no jugular venous distention. No thyroid enlargement. LUNGS: +diminished breath sounds in the lung bases bilaterally. [place] in place. No use of accessory muscles of respiration.  CARDIOVASCULAR: RRR, S1, S2 normal. No murmurs, rubs, or gallops.  ABDOMEN: Soft, nontender, nondistended. Bowel sounds present.  GENITOURINARY: testicles are swollen and erythematous, but less swollen than yesterday. No tenderness to palpation. EXTREMITIES: No pedal edema, cyanosis, or clubbing.  NEUROLOGIC: CN 2-12 intact, no focal deficits. 5/5 muscle strength throughout all extremities. Sensation intact throughout. Gait not checked.  PSYCHIATRIC: The patient is alert and oriented x 1. Answering questions appropriately. SKIN: No obvious rash, lesion, or ulcer.  LABORATORY PANEL:  Male CBC Recent Labs  Lab 07/30/19 0508  WBC 9.0  HGB 14.0  HCT 42.4  PLT 154   ------------------------------------------------------------------------------------------------------------------ Chemistries  Recent Labs  Lab 07/27/19 2159  07/30/19 0508  NA 137   < > 139  K 4.0   < > 3.7  CL 100   < > 104  CO2 23   < > 26  GLUCOSE 354*   < > 212*  BUN 14   < > 14  CREATININE 0.96   < > 0.78  CALCIUM 8.9   < > 8.3*  AST 17  --   --   ALT 27  --   --   ALKPHOS 83  --   --   BILITOT 1.3*  --   --    < > = values in this interval not displayed.   RADIOLOGY:  Dg Chest 1 View  Result Date: 07/31/2019 CLINICAL DATA:  Hypoxia.  Coronary artery disease. EXAM: CHEST  1 VIEW COMPARISON:  07/28/2019 FINDINGS: Heart size is stable. Prior CABG. Low lung volumes again noted with mild atelectasis in right lung base. No evidence of pulmonary edema or consolidation. No pleural effusion visualized. IMPRESSION: Stable low lung volumes and mild right basilar atelectasis. No acute findings. Electronically Signed   By: Marlaine Hind M.D.   On: 07/31/2019  08:46   ASSESSMENT AND PLAN:   Community-acquired pneumonia.  Initially meeting sepsis criteria with fever, tachycardia, and leukocytosis.  Sepsis is resolving.  Patient is off oxygen and on room air this morning. -Continue doxycycline -Blood cultures with no growth -RVP was negative -Wean O2 as able -PT recommending SNF, family would like to take patient home  Altered mental status- improving. Patient has some underlying dementia at baseline. -CT head negative for acute abnormalities -Haldol IV as needed agitation -TSH, ammonia, HIV, RPR, vitamin B12, folate are unremarkable  Scrotal edema- swelling seems a little better this morning. ?Possibly scrotal cellulitis vs irritation. Patient denies any pain. -Scrotal US with small left hydrocele -Doxycycline as above -Will also add some nystatin cream to see if this helps  Hypertension- normotensive today -Continue home metoprolol  CAD-stable, no active chest pain. -Continue home aspirin and Lipitor  Paroxysmal atrial fibrillation- in NSR here -Continue home Eliquis and metoprolol  Type 2 diabetes -Continue SSI  Called wife, [name], and updated her over the phone.  All the records are reviewed and case discussed with Care Management/Social Worker. Management plans discussed with the patient, family and they are in agreement.  CODE STATUS: Full Code  TOTAL TIME TAKING CARE OF THIS PATIENT: 38 minutes.   More than 50% of the time was spent in counseling/coordination of care: YES  POSSIBLE D/C IN 1-2 DAYS, DEPENDING ON CLINICAL CONDITION.   Berna Spare Mayo M.D on 07/31/2019 at 12:46 PM  Between 7am to 6pm - Pager - [phone]  After 6pm go to [email or web address] - Proofreader  [facility] [place] [facility]  Office  [phone]  CC: Primary care physician; Einar Pheasant, MD  Note: This dictation was prepared with Dragon dictation along with smaller phrase technology. Any transcriptional errors that result  from this process are unintentional.

## 2019-07-31 NOTE — Care Management Important Message (Signed)
Important Message  Patient Details  Name: Kyle Richardson MRN: [id number] Date of Birth: 1927/12/26   Medicare Important Message Given:  Yes     [name] A Lister Brizzi 07/31/2019, 11:21 AM

## 2019-08-01 LAB — CULTURE, BLOOD (ROUTINE X 2)
Culture: NO GROWTH
Culture: NO GROWTH
Special Requests: ADEQUATE

## 2019-08-01 LAB — GLUCOSE, CAPILLARY
Glucose-Capillary: 158 mg/dL — ABNORMAL HIGH (ref 70–99)
Glucose-Capillary: 138 mg/dL — ABNORMAL HIGH (ref 70–99)

## 2019-08-01 MED ORDER — SITAGLIPTIN PHOSPHATE 25 MG PO TABS: 25.0000 mg | ORAL_TABLET | Freq: Every day | ORAL | 0 refills | Status: DC

## 2019-08-01 MED ORDER — BISACODYL 10 MG RE SUPP: 10.0000 mg | Freq: Every day | RECTAL | Status: DC | PRN

## 2019-08-01 MED ORDER — DOXYCYCLINE HYCLATE 100 MG PO TABS: 100.0000 mg | ORAL_TABLET | Freq: Two times a day (BID) | ORAL | 0 refills | Status: DC

## 2019-08-01 NOTE — Discharge Instructions (Signed)
It was so nice to meet you during this hospitalization!  You came into the hospital with a pneumonia. We gave you antibiotics for this. You also developed some swelling of your genital area. This may be due to a bacterial or fungal infection. The swelling got much better. You should keep using nystatin powder to the area.  I have prescribed the following medications: 1. Please take Doxycycline 100mg  twice a day, starting tonight and continuing for 2 more days- this is the antibiotic for the pneumonia and the possible skin infection of your genital area 2. Your blood sugars were high while you were here and your hemoglobin A1c was 10.3%. I started you on a diabetes medicine called Januvia. Please take this once a day.  Take care, Dr. Brett Albino

## 2019-08-01 NOTE — TOC Progression Note (Signed)
Transition of Care [facility]) - Progression Note    Patient Details  Name: Kyle Richardson MRN: [id number] Date of Birth: 05-May-1928  Transition of Care [facility]) CM/SW Contact  Landra Howze, Lenice Llamas Phone Number: [phone]  08/01/2019, 9:15 AM  Clinical Narrative: Clinical Social Worker (CSW) received a call from patient's daughter Jackelyn Poling this morning. Per daughter she was informed that patient will D/C home today with a hospital bed. Daughter reported that they do not want a hospital bed and stated they have a hospital bed already at home however it needs to be assembled. Per daughter patient's wife [name] will be at home today and patient can come home via EMS transport. CSW explained that PT is recommending SNF however per [name] patient is total care at home and has a hoyer lift. Daughter confirmed this and reported they do not want SNF placement and want patient to D/C home. Daughter is agreeable to [facility]. CSW explained to daughter that hospital bed will not be arranged since they have one already. Daughter thanked CSW for assistance.    Expected Discharge Plan: [place] Barriers to Discharge: Continued Medical Work up  Expected Discharge Plan and Services Expected Discharge Plan: [place]   Discharge Planning Services: CM Consult                     DME Arranged: N/A                     Social Determinants of Health (SDOH) Interventions    Readmission Risk Interventions No flowsheet data found.

## 2019-08-01 NOTE — Discharge Summary (Signed)
Kyle Richardson at [place] NAME: Kyle Richardson    MR#:  [id number]  DATE OF BIRTH:  1927-12-06  DATE OF ADMISSION:  07/27/2019   ADMITTING PHYSICIAN: Lance Coon, MD  DATE OF DISCHARGE: 08/01/19  PRIMARY CARE PHYSICIAN: Einar Pheasant, MD   ADMISSION DIAGNOSIS:  Sepsis with acute hypoxic respiratory failure, due to unspecified organism, unspecified whether septic shock present ([place]) [A41.9, R65.20, J96.01] Sepsis ([place]) [A41.9] DISCHARGE DIAGNOSIS:  Principal Problem:   Sepsis ([place]) Active Problems:   Hypertension   CAD (coronary artery disease)   PAF (paroxysmal atrial fibrillation) ([place])   Diabetes mellitus type 2 in nonobese ([place])   Pneumonia  SECONDARY DIAGNOSIS:   Past Medical History:  Diagnosis Date  . Abdominal fibromatosis   . 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  s/p PTCA of the mid RCA 1/96, also  s/p  CABG x 4 7/96  . Complication of anesthesia    confusion and hallucinations for several days after receiving Versed  . Degenerative disc disease   . Dementia ([place])   . Diabetes mellitus ([place])   . Glaucoma   . Hypercholesterolemia   . Hypertension   . Migraine headache    history of  . Myocardial infarction ([place]) 1996  . Osteoarthritis   . Sleep apnea   . Stroke [facility]) 02/22/2017   affected left side    HOSPITAL COURSE:   [name] is an 85 year old male who presented to the ED with an altered mental status.  In the ED, he was meeting sepsis criteria.  He was hypoxic and was found to have a possible pneumonia.  He was admitted for further management.  Community-acquired pneumonia. Initially meeting sepsis criteria with fever,tachycardia,and leukocytosis. Sepsis is resolving.  Patient is off oxygen and on room air this morning. -Treated with empiric IV antibiotics and then transition to doxycycline on discharge for a total 7-day course -Blood cultures with no growth -RVP  was negative -Wean O2 as able -PT recommending SNF, family would like to take patient home- home health orders were placed  Altered mental status- improved. Patient has some underlying dementia at baseline.  Per caregiver at bedside, patient was at his mental status baseline on the day of discharge. -CT head negative for acute abnormalities -Haldol IV as needed agitation -TSH, ammonia, HIV, RPR, vitamin B12, folate are unremarkable  Scrotal edema- improved.  Likely due to some cellulitis versus fungal infection. -Scrotal US with small left hydrocele -Doxycycline for a total 7-day course -Continue nystatin powder twice daily at home  Hypertension- normotensive today -Continued home metoprolol  CAD-stable, no active chest pain. -Continued home aspirin and Lipitor  Paroxysmal atrial fibrillation- in NSR here -Continued home Eliquis and metoprolol  Type 2 diabetes -A1c was 10.3% this admission -Started on Januvia 25 mg daily  DISCHARGE CONDITIONS:  Community-acquired pneumonia Scrotal edema Hypertension CAD Paroxysmal atrial fibrillation Type 2 diabetes CONSULTS OBTAINED:  None DRUG ALLERGIES:   Allergies  Allergen Reactions  . Fentanyl Other (See Comments)    Reaction: confusion  . Flomax [Tamsulosin Hcl] Other (See Comments)    Unknown  . Lamisil [Terbinafine] Other (See Comments)    Unknown  . Levofloxacin Other (See Comments)    Diplopia. NEVER put patient on levoquin!  . Lorazepam Other (See Comments)    Reaction: confusion/ hallucinations   . Metformin And Related Diarrhea  . Sertraline Other (See Comments)    Unknown  . Sulfa  Antibiotics Other (See Comments)    Unknown  . Versed [Midazolam] Other (See Comments)    Hallucinations and confusion  . Clarithromycin Diarrhea  . Codeine Other (See Comments)    Unknown   DISCHARGE MEDICATIONS:   Allergies as of 08/01/2019      Reactions   Fentanyl Other (See Comments)   Reaction: confusion   Flomax  [tamsulosin Hcl] Other (See Comments)   Unknown   Lamisil [terbinafine] Other (See Comments)   Unknown   Levofloxacin Other (See Comments)   Diplopia. NEVER put patient on levoquin!   Lorazepam Other (See Comments)   Reaction: confusion/ hallucinations    Metformin And Related Diarrhea   Sertraline Other (See Comments)   Unknown   Sulfa Antibiotics Other (See Comments)   Unknown   Versed [midazolam] Other (See Comments)   Hallucinations and confusion   Clarithromycin Diarrhea   Codeine Other (See Comments)   Unknown      Medication List    STOP taking these medications   albuterol 108 (90 Base) MCG/ACT inhaler Commonly known as: VENTOLIN HFA     TAKE these medications   ALPRAZolam 0.25 MG tablet Commonly known as: XANAX Take 0.25-0.5 mg by mouth 2 (two) times daily as needed for anxiety or sleep.   apixaban 5 MG Tabs tablet Commonly known as: ELIQUIS Take 1 tablet (5 mg total) by mouth 2 (two) times daily.   aspirin 81 MG EC tablet Take 1 tablet (81 mg total) by mouth daily.   atorvastatin 40 MG tablet Commonly known as: LIPITOR TAKE ONE TABLET BY MOUTH AT BEDTIME   divalproex 125 MG capsule Commonly known as: DEPAKOTE SPRINKLE Take 125-250 mg by mouth 2 (two) times daily. One capsule at 1300-1400 and two capsules at bedtime   doxycycline 100 MG tablet Commonly known as: VIBRA-TABS Take 1 tablet (100 mg total) by mouth every 12 (twelve) hours.   fluticasone 50 MCG/ACT nasal spray Commonly known as: FLONASE Place 2 sprays into both nostrils daily.   latanoprost 0.005 % ophthalmic solution Commonly known as: XALATAN Place 1 drop into both eyes at bedtime.   loratadine 10 MG tablet Commonly known as: CLARITIN Take 10 mg by mouth daily.   Melatonin ER 5 MG Tbcr Take 5 mg by mouth at bedtime.   metoprolol succinate 25 MG 24 hr tablet Commonly known as: TOPROL-XL Take 0.5 tablets (12.5 mg total) by mouth daily.   nitroGLYCERIN 0.4 MG SL tablet Commonly  known as: NITROSTAT Place 0.4 mg under the tongue every 5 (five) minutes as needed for chest pain.   sitaGLIPtin 25 MG tablet Commonly known as: Januvia Take 1 tablet (25 mg total) by mouth daily.   tiZANidine 4 MG tablet Commonly known as: ZANAFLEX Take 1 tablet (4 mg total) by mouth daily as needed for muscle spasms.   traZODone 50 MG tablet Commonly known as: DESYREL Take 50 mg by mouth at bedtime.        DISCHARGE INSTRUCTIONS:  1.  Follow-up with PCP in 5 days 2.  Take Januvia 25 mg daily 3.  Take doxycycline twice a day, starting this evening and continuing for 2 more days. DIET:  Cardiac diet and Diabetic diet DISCHARGE CONDITION:  Stable ACTIVITY:  Patient is bedbound at baseline OXYGEN:  Home Oxygen: No.  Oxygen Delivery: room air DISCHARGE LOCATION:  home   If you experience worsening of your admission symptoms, develop shortness of breath, life threatening emergency, suicidal or homicidal thoughts you must seek medical attention  immediately by calling 911 or calling your MD immediately  if symptoms less severe.  You Must read complete instructions/literature along with all the possible adverse reactions/side effects for all the Medicines you take and that have been prescribed to you. Take any new Medicines after you have completely understood and accpet all the possible adverse reactions/side effects.   Please note  You were cared for by a hospitalist during your hospital stay. If you have any questions about your discharge medications or the care you received while you were in the hospital after you are discharged, you can call the unit and asked to speak with the hospitalist on call if the hospitalist that took care of you is not available. Once you are discharged, your primary care physician will handle any further medical issues. Please note that NO REFILLS for any discharge medications will be authorized once you are discharged, as it is imperative that you  return to your primary care physician (or establish a relationship with a primary care physician if you do not have one) for your aftercare needs so that they can reassess your need for medications and monitor your lab values.    On the day of Discharge:  VITAL SIGNS:  Blood pressure 118/60, pulse 70, temperature 98.3 F (36.8 C), temperature source Axillary, resp. rate 20, weight 77.1 kg, SpO2 91 %. PHYSICAL EXAMINATION:  GENERAL:Laying in the bed with no acute distress.  HEENT: Head atraumatic, normocephalic. Pupils equal, round, reactive to light and accommodation. No scleral icterus. Extraocular muscles intact. Oropharynx and nasopharynx clear.  NECK: Supple, no jugular venous distention. No thyroid enlargement. LUNGS:+diminished breath sounds in the lung bases bilaterally. [place] in place.No use of accessory muscles of respiration.  CARDIOVASCULAR: RRR, S1, S2 normal. No murmurs, rubs, or gallops.  ABDOMEN: Soft, nontender, nondistended. Bowel sounds present.  GENITOURINARY: testicles are swollen and erythematous, but less swollen than yesterday. No tenderness to palpation. EXTREMITIES: No pedal edema, cyanosis, or clubbing.  NEUROLOGIC:CN 2-12 intact, no focal deficits. 5/5 muscle strength throughout all extremities. Sensation intact throughout. Gait not checked.  PSYCHIATRIC: The patient is alert and oriented x 1. Answering questions appropriately. SKIN: No obvious rash, lesion, or ulcer.  DATA REVIEW:   CBC Recent Labs  Lab 07/30/19 0508  WBC 9.0  HGB 14.0  HCT 42.4  PLT 154    Chemistries  Recent Labs  Lab 07/27/19 2159  07/30/19 0508  NA 137   < > 139  K 4.0   < > 3.7  CL 100   < > 104  CO2 23   < > 26  GLUCOSE 354*   < > 212*  BUN 14   < > 14  CREATININE 0.96   < > 0.78  CALCIUM 8.9   < > 8.3*  AST 17  --   --   ALT 27  --   --   ALKPHOS 83  --   --   BILITOT 1.3*  --   --    < > = values in this interval not displayed.     Microbiology Results   Results for orders placed or performed during the hospital encounter of 07/27/19  Culture, blood (Routine x 2)     Status: None   Collection Time: 07/27/19  9:59 PM   Specimen: BLOOD  Result Value Ref Range Status   Specimen Description BLOOD RIGHT HAND  Final   Special Requests   Final    BOTTLES DRAWN AEROBIC AND ANAEROBIC Blood Culture  results may not be optimal due to an excessive volume of blood received in culture bottles   Culture   Final    NO GROWTH 5 DAYS Performed at [facility], [address] [place], [place] [address]    Report Status 08/01/2019 FINAL  Final  Culture, blood (Routine x 2)     Status: None   Collection Time: 07/27/19  9:59 PM   Specimen: BLOOD  Result Value Ref Range Status   Specimen Description BLOOD LEFT HAND  Final   Special Requests   Final    BOTTLES DRAWN AEROBIC AND ANAEROBIC Blood Culture adequate volume   Culture   Final    NO GROWTH 5 DAYS Performed at [facility], [address] [place], [place] [address]    Report Status 08/01/2019 FINAL  Final  SARS Coronavirus 2 by RT PCR (hospital order, performed in [place] hospital lab) Nasopharyngeal Nasopharyngeal Swab     Status: None   Collection Time: 07/27/19  9:59 PM   Specimen: Nasopharyngeal Swab  Result Value Ref Range Status   SARS Coronavirus 2 NEGATIVE NEGATIVE Final    Comment: (NOTE) If result is NEGATIVE SARS-CoV-2 target nucleic acids are NOT DETECTED. The SARS-CoV-2 RNA is generally detectable in upper and lower  respiratory specimens during the acute phase of infection. The lowest  concentration of SARS-CoV-2 viral copies this assay can detect is 250  copies / mL. A negative result does not preclude SARS-CoV-2 infection  and should not be used as the sole basis for treatment or other  patient management decisions.  A negative result may occur with  improper specimen collection / handling, submission of specimen other  than nasopharyngeal swab,  presence of viral mutation(s) within the  areas targeted by this assay, and inadequate number of viral copies  (<250 copies / mL). A negative result must be combined with clinical  observations, patient history, and epidemiological information. If result is POSITIVE SARS-CoV-2 target nucleic acids are DETECTED. The SARS-CoV-2 RNA is generally detectable in upper and lower  respiratory specimens dur ing the acute phase of infection.  Positive  results are indicative of active infection with SARS-CoV-2.  Clinical  correlation with patient history and other diagnostic information is  necessary to determine patient infection status.  Positive results do  not rule out bacterial infection or co-infection with other viruses. If result is PRESUMPTIVE POSTIVE SARS-CoV-2 nucleic acids MAY BE PRESENT.   A presumptive positive result was obtained on the submitted specimen  and confirmed on repeat testing.  While 2019 novel coronavirus  (SARS-CoV-2) nucleic acids may be present in the submitted sample  additional confirmatory testing may be necessary for epidemiological  and / or clinical management purposes  to differentiate between  SARS-CoV-2 and other Sarbecovirus currently known to infect humans.  If clinically indicated additional testing with an alternate test  methodology [phone]) is advised. The SARS-CoV-2 RNA is generally  detectable in upper and lower respiratory sp ecimens during the acute  phase of infection. The expected result is Negative. Fact Sheet for Patients:  StrictlyIdeas.no Fact Sheet for Healthcare Providers: BankingDealers.co.za This test is not yet approved or cleared by the [place] FDA and has been authorized for detection and/or diagnosis of SARS-CoV-2 by FDA under an Emergency Use Authorization (EUA).  This EUA will remain in effect (meaning this test can be used) for the duration of the COVID-19 declaration under  Section 564(b)(1) of the Act, 21 U.S.C. section 360bbb-3(b)(1), unless the authorization  is terminated or revoked sooner. Performed at [facility], [address] [place], [place] [address]   Respiratory Panel by PCR     Status: None   Collection Time: 07/28/19  8:55 AM   Specimen: Nasopharyngeal Swab; Respiratory  Result Value Ref Range Status   Adenovirus NOT DETECTED NOT DETECTED Final   Coronavirus 229E NOT DETECTED NOT DETECTED Final    Comment: (NOTE) The Coronavirus on the Respiratory Panel, DOES NOT test for the novel  Coronavirus (2019 nCoV)    Coronavirus HKU1 NOT DETECTED NOT DETECTED Final   Coronavirus NL63 NOT DETECTED NOT DETECTED Final   Coronavirus OC43 NOT DETECTED NOT DETECTED Final   Metapneumovirus NOT DETECTED NOT DETECTED Final   Rhinovirus / Enterovirus NOT DETECTED NOT DETECTED Final   Influenza A NOT DETECTED NOT DETECTED Final   Influenza B NOT DETECTED NOT DETECTED Final   Parainfluenza Virus 1 NOT DETECTED NOT DETECTED Final   Parainfluenza Virus 2 NOT DETECTED NOT DETECTED Final   Parainfluenza Virus 3 NOT DETECTED NOT DETECTED Final   Parainfluenza Virus 4 NOT DETECTED NOT DETECTED Final   Respiratory Syncytial Virus NOT DETECTED NOT DETECTED Final   Bordetella pertussis NOT DETECTED NOT DETECTED Final   Chlamydophila pneumoniae NOT DETECTED NOT DETECTED Final   Mycoplasma pneumoniae NOT DETECTED NOT DETECTED Final    Comment: Performed at [facility], [place]. [address] [place], [place] [address]    RADIOLOGY:  No results found.   Management plans discussed with the patient, family and they are in agreement.  CODE STATUS: Full Code   TOTAL TIME TAKING CARE OF THIS PATIENT: 40 minutes.    Berna Spare Mayo M.D on 08/01/2019 at 10:19 AM  Between 7am to 6pm - Pager - [phone]  After 6pm go to [email or web address] - Proofreader  [facility]  Office  [phone]  CC: Primary care physician;  Einar Pheasant, MD   Note: This dictation was prepared with Dragon dictation along with smaller phrase technology. Any transcriptional errors that result from this process are unintentional.

## 2019-08-01 NOTE — Progress Notes (Signed)
Received call from pt's daughter Jackelyn Poling). Debbie's phone # is [phone]. She reports that pt doesn't need a hospital bed. Discussed HH PT, RN and an aide through [facility] and she agrees with [facility] services. Contacted [place] at [facility], she reports that she received the referral on Tuesday. Informed her that pt is going home today. Completed EMS forms.

## 2019-08-04 ENCOUNTER — Telehealth: Payer: Self-pay

## 2019-08-04 NOTE — Telephone Encounter (Signed)
Transition Care Management Follow-up Telephone Call   Date discharged? 08/01/19  Information received via wife (HIPAA compliant).   How have you been since you were released from the hospital? Wife states, "He has been resting well, but not too good last night, so he has been extremely chatty today. Voiding/BM with no problem. Drinking better with a straw. Coughing up white with a little pinkish mucus."   Do you understand why you were in the hospital? Yes   Do you understand the discharge instructions? Yes   Where were you discharged to? Home   Items Reviewed:  Medications reviewed: Yes, stop albuterol. Take Januvia 25mg  daily. Daughter manages medications. No questions/concerns.  Allergies reviewed: Yes, none new.  Dietary changes reviewed: Yes, cardiac/diabetic  Referrals reviewed: Yes   Functional Questionnaire:   Activities of Daily Living (ADLs):   He states they are independent in the following: Difficulty performing ADLs. Home health in progress 24/7. States they require assistance with the following: Bathing, dressing, ambulating, toileting, meal prep.   Any transportation issues/concerns?: None.    Any patient concerns? None at this time.    Confirmed importance and date/time of follow-up visits scheduled Yes, appointment scheduled with pcp 08/08/19 @ 11:00 via doxy [id number].  Provider Appointment booked with Dr. Nicki Reaper  Confirmed with patient if condition begins to worsen call PCP or go to the ER.  Patient was given the office number and encouraged to call back with question or concerns.  : Yes

## 2019-08-05 ENCOUNTER — Telehealth: Payer: Self-pay | Admitting: *Deleted

## 2019-08-05 DIAGNOSIS — E119 Type 2 diabetes mellitus without complications: Secondary | ICD-10-CM | POA: Diagnosis not present

## 2019-08-05 DIAGNOSIS — M6281 Muscle weakness (generalized): Secondary | ICD-10-CM | POA: Diagnosis not present

## 2019-08-05 DIAGNOSIS — Z951 Presence of aortocoronary bypass graft: Secondary | ICD-10-CM | POA: Diagnosis not present

## 2019-08-05 DIAGNOSIS — I2510 Atherosclerotic heart disease of native coronary artery without angina pectoris: Secondary | ICD-10-CM | POA: Diagnosis not present

## 2019-08-05 DIAGNOSIS — Z9181 History of falling: Secondary | ICD-10-CM | POA: Diagnosis not present

## 2019-08-05 DIAGNOSIS — M1990 Unspecified osteoarthritis, unspecified site: Secondary | ICD-10-CM | POA: Diagnosis not present

## 2019-08-05 DIAGNOSIS — I252 Old myocardial infarction: Secondary | ICD-10-CM | POA: Diagnosis not present

## 2019-08-05 DIAGNOSIS — Z7982 Long term (current) use of aspirin: Secondary | ICD-10-CM | POA: Diagnosis not present

## 2019-08-05 DIAGNOSIS — R2689 Other abnormalities of gait and mobility: Secondary | ICD-10-CM | POA: Diagnosis not present

## 2019-08-05 DIAGNOSIS — I10 Essential (primary) hypertension: Secondary | ICD-10-CM | POA: Diagnosis not present

## 2019-08-05 DIAGNOSIS — I69354 Hemiplegia and hemiparesis following cerebral infarction affecting left non-dominant side: Secondary | ICD-10-CM | POA: Diagnosis not present

## 2019-08-05 DIAGNOSIS — I480 Paroxysmal atrial fibrillation: Secondary | ICD-10-CM | POA: Diagnosis not present

## 2019-08-05 DIAGNOSIS — Z7901 Long term (current) use of anticoagulants: Secondary | ICD-10-CM | POA: Diagnosis not present

## 2019-08-05 DIAGNOSIS — H409 Unspecified glaucoma: Secondary | ICD-10-CM | POA: Diagnosis not present

## 2019-08-05 DIAGNOSIS — F0390 Unspecified dementia without behavioral disturbance: Secondary | ICD-10-CM | POA: Diagnosis not present

## 2019-08-05 DIAGNOSIS — I714 Abdominal aortic aneurysm, without rupture: Secondary | ICD-10-CM | POA: Diagnosis not present

## 2019-08-05 DIAGNOSIS — I6529 Occlusion and stenosis of unspecified carotid artery: Secondary | ICD-10-CM | POA: Diagnosis not present

## 2019-08-05 NOTE — Telephone Encounter (Signed)
Please confirm pt doing ok.  Confirm breathing ok and confirm not coughing up blood, etc.

## 2019-08-05 NOTE — Telephone Encounter (Signed)
Verbals left on secure voicemail °

## 2019-08-05 NOTE — Telephone Encounter (Signed)
Copied from [place] [id number]. Topic: Quick Communication - Home Health Verbal Orders >> Aug 05, 2019 11:46 AM Virl Axe D wrote: Caller/Agency: Bobbe Medico, PT/ [place] Number: [id number] VM Requesting OT/PT/Skilled Nursing/Social Work/Speech Therapy: PT Frequency: 2 week 3 / 1 week 3

## 2019-08-05 NOTE — Telephone Encounter (Signed)
Spoke with wife and she states, "He is doing well today. He had a really good nights sleep and is not coughing up any blood." Denies ever seeing any blood. The mucus was the only thing seen since his return from the hospital and none today. Confirms swallowing without issues. Eating with no problems. Agrees to contact pcp as needed and any changes occur or ED if after 5:00pm.

## 2019-08-08 ENCOUNTER — Ambulatory Visit (INDEPENDENT_AMBULATORY_CARE_PROVIDER_SITE_OTHER): Payer: PPO | Admitting: Internal Medicine

## 2019-08-08 ENCOUNTER — Other Ambulatory Visit: Payer: Self-pay | Admitting: Internal Medicine

## 2019-08-08 ENCOUNTER — Encounter: Payer: Self-pay | Admitting: Internal Medicine

## 2019-08-08 DIAGNOSIS — E119 Type 2 diabetes mellitus without complications: Secondary | ICD-10-CM | POA: Diagnosis not present

## 2019-08-08 DIAGNOSIS — N5089 Other specified disorders of the male genital organs: Secondary | ICD-10-CM | POA: Diagnosis not present

## 2019-08-08 DIAGNOSIS — I10 Essential (primary) hypertension: Secondary | ICD-10-CM

## 2019-08-08 DIAGNOSIS — I480 Paroxysmal atrial fibrillation: Secondary | ICD-10-CM

## 2019-08-08 DIAGNOSIS — A419 Sepsis, unspecified organism: Secondary | ICD-10-CM

## 2019-08-08 DIAGNOSIS — I2510 Atherosclerotic heart disease of native coronary artery without angina pectoris: Secondary | ICD-10-CM | POA: Diagnosis not present

## 2019-08-08 NOTE — Progress Notes (Signed)
Order placed for urology referral.  

## 2019-08-08 NOTE — Progress Notes (Signed)
Patient ID: Kyle Richardson, male   DOB: March 12, 1928, 85 y.o.   MRN: [id number]   Virtual Visit via video Note  This visit type was conducted due to national recommendations for restrictions regarding the COVID-19 pandemic (e.g. social distancing).  This format is felt to be most appropriate for this patient at this time.  All issues noted in this document were discussed and addressed.  No physical exam was performed (except for noted visual exam findings with Video Visits).   I connected with Kyle Richardson by a video enabled telemedicine application and verified that I am speaking with the correct person using two identifiers. Location patient: home Location provider: work Persons participating in the virtual visit: patient, provider, pts wife Kyle Richardson) and pts daughter Kyle Richardson  I discussed the limitations, risks, security and privacy concerns of performing an evaluation and management service by video and the availability of in person appointments.  The patient expressed understanding and agreed to proceed.   Reason for visit: hospital follow up.   HPI: He was hospitalized 07/27/19 - 08/01/19.  Was diagnosed with sepsis with acut hypoxic respiratory failure after presenting to ED with altered mental status.  Was diagnosed with CAP.  Oxygen weaned off.  Treated with IV abx and transitioned to doxycycline at discharge.  CT head negative. For acute abnormalities.  Was also found to have scrotal edema.  Felt to be related to cellulitis and fungal infection.  Scrotal ultrasound - small left hydrocele.  Was found to have significantly elevated a1c - 10.3.  Was started on januvia.  Since discharge - cough better.  No chest pain.  Breathing overall stable.  Is eating, but reports decreased appetite.  No nausea or vomiting reported.  No abdominal pain.  Completed doxycycline.  Is followed by [facility].  They started him on keflex - for the scrotal infection.  Daughter reports the swelling is some better.  Boil-  drained.  Still some increased discomfort and changes - redness.  Have placed referral to urology.    ROS: See pertinent positives and negatives per HPI.  Past Medical History:  Diagnosis Date   Abdominal fibromatosis    CAD (coronary artery disease)    s/p inferior MI with RV involvement 1/96.  s/p PTCA of the mid RCA 1/96, also  s/p  CABG x 4 [id number]   Complication of anesthesia    confusion and hallucinations for several days after receiving Versed   Degenerative disc disease    Dementia ([place])    Diabetes mellitus ([place])    Glaucoma    Hypercholesterolemia    Hypertension    Migraine headache    history of   Myocardial infarction ([place]) 1996   Osteoarthritis    Sleep apnea    Stroke ([place]) 02/22/2017   affected left side     Past Surgical History:  Procedure Laterality Date   APPENDECTOMY     CAROTID ANGIOGRAPHY Bilateral 04/19/2018   Procedure: CAROTID ANGIOGRAPHY;  Surgeon: Algernon Huxley, MD;  Location: [place] [facility];  Service: Cardiovascular;  Laterality: Bilateral;   CAROTID PTA/STENT INTERVENTION Right 05/22/2018   Procedure: CAROTID PTA/STENT INTERVENTION;  Surgeon: Katha Cabal, MD;  Location: [place] [facility];  Service: Cardiovascular;  Laterality: Right;   CATARACT EXTRACTION W/ INTRAOCULAR LENS  IMPLANT, BILATERAL     CHOLECYSTECTOMY     Removed   CORONARY ARTERY BYPASS GRAFT  7/96   x4   HEMORRHOID SURGERY     HERNIA REPAIR     TONSILLECTOMY  UPP and tonsillectomy  1/86    Family History  Problem Relation Age of Onset   Heart disease Mother        died age 87   Heart disease Father        and other vascular disease   Diabetes Other        four siblings   Heart disease Brother        s/p CABG   Colon cancer Neg Hx    Prostate cancer Neg Hx     SOCIAL HX: reviewed.    Current Outpatient Medications:    ALPRAZolam (XANAX) 0.25 MG tablet, Take 0.25-0.5 mg by mouth 2 (two) times daily as needed for  anxiety or sleep. , Disp: , Rfl:    apixaban (ELIQUIS) 5 MG TABS tablet, Take 1 tablet (5 mg total) by mouth 2 (two) times daily., Disp: 30 tablet, Rfl: 1   aspirin EC 81 MG EC tablet, Take 1 tablet (81 mg total) by mouth daily., Disp: 180 tablet, Rfl: 2   atorvastatin (LIPITOR) 40 MG tablet, TAKE ONE TABLET BY MOUTH AT BEDTIME, Disp: 90 tablet, Rfl: 1   divalproex (DEPAKOTE SPRINKLE) 125 MG capsule, Take 125-250 mg by mouth 2 (two) times daily. One capsule at 1300-1400 and two capsules at bedtime, Disp: , Rfl:    fluticasone (FLONASE) 50 MCG/ACT nasal spray, Place 2 sprays into both nostrils daily., Disp: 16 g, Rfl: 2   latanoprost (XALATAN) 0.005 % ophthalmic solution, Place 1 drop into both eyes at bedtime., Disp: , Rfl:    loratadine (CLARITIN) 10 MG tablet, Take 10 mg by mouth daily., Disp: , Rfl:    Melatonin ER 5 MG TBCR, Take 5 mg by mouth at bedtime. , Disp: , Rfl:    metoprolol succinate (TOPROL-XL) 25 MG 24 hr tablet, Take 0.5 tablets (12.5 mg total) by mouth daily., Disp: 30 tablet, Rfl: 1   nitroGLYCERIN (NITROSTAT) 0.4 MG SL tablet, Place 0.4 mg under the tongue every 5 (five) minutes as needed for chest pain. , Disp: , Rfl:    sitaGLIPtin (JANUVIA) 25 MG tablet, Take 1 tablet (25 mg total) by mouth daily., Disp: 30 tablet, Rfl: 0   tiZANidine (ZANAFLEX) 4 MG tablet, Take 1 tablet (4 mg total) by mouth daily as needed for muscle spasms., Disp: 30 tablet, Rfl: 5   traZODone (DESYREL) 50 MG tablet, Take 50 mg by mouth at bedtime. , Disp: , Rfl:   EXAM:  VITALS per patient if applicable: [id number]  GENERAL: alert, oriented, appears well and in no acute distress  HEENT: atraumatic, conjunttiva clear, no obvious abnormalities on inspection of external nose and ears  NECK: normal movements of the head and neck  LUNGS: on inspection no signs of respiratory distress, breathing rate appears normal, no obvious gross SOB, gasping or wheezing  CV: no obvious  cyanosis  PSYCH/NEURO: pleasant and cooperative, no obvious depression or anxiety, speech and thought processing grossly intact  ASSESSMENT AND PLAN:  Discussed the following assessment and plan:  CAD (coronary artery disease) Followed by cardiology.  Stable.    Diabetes mellitus type 2 in nonobese (HCC) Low carb diet and exercise.  Follow metb and a1c.   Hypertension Blood pressure under good control.  Continue same medication regimen.  Follow pressures.  Follow metabolic panel.    PAF (paroxysmal atrial fibrillation) ([place]) Followed by cardiology.  Stable on current regimen.    Sepsis [facility]) Recently admitted and diagnosed with sepsis.  Treated with IV abx. Discharged on  doxycycline.  Completed. On keflex now. No cough or congestion.  Breathing stable.  Continue keflex and nystatin.  Keep appt with urology.    Scrotal edema Persistent swelling.  Some redness.  Per report, swelling is better.  Continue keflex and nystatin.  Follow closely.  Referral already placed for urology.     I discussed the assessment and treatment plan with the patient. The patient was provided an opportunity to ask questions and all were answered. The patient agreed with the plan and demonstrated an understanding of the instructions.   The patient was advised to call back or seek an in-person evaluation if the symptoms worsen or if the condition fails to improve as anticipated.   Einar Pheasant, MD

## 2019-08-11 DIAGNOSIS — I10 Essential (primary) hypertension: Secondary | ICD-10-CM | POA: Diagnosis not present

## 2019-08-11 DIAGNOSIS — E119 Type 2 diabetes mellitus without complications: Secondary | ICD-10-CM | POA: Diagnosis not present

## 2019-08-11 DIAGNOSIS — H409 Unspecified glaucoma: Secondary | ICD-10-CM | POA: Diagnosis not present

## 2019-08-11 DIAGNOSIS — R2689 Other abnormalities of gait and mobility: Secondary | ICD-10-CM | POA: Diagnosis not present

## 2019-08-11 DIAGNOSIS — I69354 Hemiplegia and hemiparesis following cerebral infarction affecting left non-dominant side: Secondary | ICD-10-CM | POA: Diagnosis not present

## 2019-08-11 DIAGNOSIS — Z7901 Long term (current) use of anticoagulants: Secondary | ICD-10-CM | POA: Diagnosis not present

## 2019-08-11 DIAGNOSIS — I6529 Occlusion and stenosis of unspecified carotid artery: Secondary | ICD-10-CM | POA: Diagnosis not present

## 2019-08-11 DIAGNOSIS — M1990 Unspecified osteoarthritis, unspecified site: Secondary | ICD-10-CM | POA: Diagnosis not present

## 2019-08-11 DIAGNOSIS — I714 Abdominal aortic aneurysm, without rupture: Secondary | ICD-10-CM | POA: Diagnosis not present

## 2019-08-11 DIAGNOSIS — I480 Paroxysmal atrial fibrillation: Secondary | ICD-10-CM | POA: Diagnosis not present

## 2019-08-11 DIAGNOSIS — Z7982 Long term (current) use of aspirin: Secondary | ICD-10-CM | POA: Diagnosis not present

## 2019-08-11 DIAGNOSIS — I252 Old myocardial infarction: Secondary | ICD-10-CM | POA: Diagnosis not present

## 2019-08-11 DIAGNOSIS — M6281 Muscle weakness (generalized): Secondary | ICD-10-CM | POA: Diagnosis not present

## 2019-08-11 DIAGNOSIS — Z9181 History of falling: Secondary | ICD-10-CM | POA: Diagnosis not present

## 2019-08-11 DIAGNOSIS — F0390 Unspecified dementia without behavioral disturbance: Secondary | ICD-10-CM | POA: Diagnosis not present

## 2019-08-11 DIAGNOSIS — Z951 Presence of aortocoronary bypass graft: Secondary | ICD-10-CM | POA: Diagnosis not present

## 2019-08-11 DIAGNOSIS — I2510 Atherosclerotic heart disease of native coronary artery without angina pectoris: Secondary | ICD-10-CM | POA: Diagnosis not present

## 2019-08-16 ENCOUNTER — Encounter: Payer: Self-pay | Admitting: Internal Medicine

## 2019-08-16 DIAGNOSIS — N5089 Other specified disorders of the male genital organs: Secondary | ICD-10-CM | POA: Insufficient documentation

## 2019-08-16 NOTE — Assessment & Plan Note (Signed)
Persistent swelling.  Some redness.  Per report, swelling is better.  Continue keflex and nystatin.  Follow closely.  Referral already placed for urology.

## 2019-08-16 NOTE — Assessment & Plan Note (Signed)
Low carb diet and exercise.  Follow met b and a1c.  

## 2019-08-16 NOTE — Assessment & Plan Note (Signed)
Followed by cardiology. Stable.   

## 2019-08-16 NOTE — Assessment & Plan Note (Signed)
Blood pressure under good control.  Continue same medication regimen.  Follow pressures.  Follow metabolic panel.   

## 2019-08-16 NOTE — Assessment & Plan Note (Signed)
Recently admitted and diagnosed with sepsis.  Treated with IV abx. Discharged on doxycycline.  Completed. On keflex now. No cough or congestion.  Breathing stable.  Continue keflex and nystatin.  Keep appt with urology.

## 2019-08-16 NOTE — Assessment & Plan Note (Signed)
Followed by cardiology.  Stable on current regimen.

## 2019-08-29 ENCOUNTER — Telehealth: Payer: Self-pay | Admitting: Internal Medicine

## 2019-08-29 DIAGNOSIS — I69354 Hemiplegia and hemiparesis following cerebral infarction affecting left non-dominant side: Secondary | ICD-10-CM | POA: Diagnosis not present

## 2019-08-29 DIAGNOSIS — M6281 Muscle weakness (generalized): Secondary | ICD-10-CM | POA: Diagnosis not present

## 2019-08-29 DIAGNOSIS — R2689 Other abnormalities of gait and mobility: Secondary | ICD-10-CM | POA: Diagnosis not present

## 2019-08-29 DIAGNOSIS — M1990 Unspecified osteoarthritis, unspecified site: Secondary | ICD-10-CM | POA: Diagnosis not present

## 2019-08-29 DIAGNOSIS — Z7982 Long term (current) use of aspirin: Secondary | ICD-10-CM

## 2019-08-29 DIAGNOSIS — I480 Paroxysmal atrial fibrillation: Secondary | ICD-10-CM

## 2019-08-29 DIAGNOSIS — Z951 Presence of aortocoronary bypass graft: Secondary | ICD-10-CM

## 2019-08-29 DIAGNOSIS — E119 Type 2 diabetes mellitus without complications: Secondary | ICD-10-CM | POA: Diagnosis not present

## 2019-08-29 DIAGNOSIS — I10 Essential (primary) hypertension: Secondary | ICD-10-CM | POA: Diagnosis not present

## 2019-08-29 DIAGNOSIS — H409 Unspecified glaucoma: Secondary | ICD-10-CM | POA: Diagnosis not present

## 2019-08-29 DIAGNOSIS — Z7901 Long term (current) use of anticoagulants: Secondary | ICD-10-CM

## 2019-08-29 DIAGNOSIS — I714 Abdominal aortic aneurysm, without rupture: Secondary | ICD-10-CM | POA: Diagnosis not present

## 2019-08-29 DIAGNOSIS — Z9181 History of falling: Secondary | ICD-10-CM | POA: Diagnosis not present

## 2019-08-29 DIAGNOSIS — F0390 Unspecified dementia without behavioral disturbance: Secondary | ICD-10-CM

## 2019-08-29 DIAGNOSIS — I252 Old myocardial infarction: Secondary | ICD-10-CM | POA: Diagnosis not present

## 2019-08-29 DIAGNOSIS — I2510 Atherosclerotic heart disease of native coronary artery without angina pectoris: Secondary | ICD-10-CM | POA: Diagnosis not present

## 2019-08-29 DIAGNOSIS — I6529 Occlusion and stenosis of unspecified carotid artery: Secondary | ICD-10-CM | POA: Diagnosis not present

## 2019-08-29 NOTE — Telephone Encounter (Signed)
James from [facility] called to advise of a missed PT visit on Monday. Pt cancelled at the door.    Contact # [phone]

## 2019-08-29 NOTE — Telephone Encounter (Signed)
Noted  

## 2019-09-10 DIAGNOSIS — I10 Essential (primary) hypertension: Secondary | ICD-10-CM | POA: Diagnosis not present

## 2019-09-10 DIAGNOSIS — M6281 Muscle weakness (generalized): Secondary | ICD-10-CM | POA: Diagnosis not present

## 2019-09-10 DIAGNOSIS — I2510 Atherosclerotic heart disease of native coronary artery without angina pectoris: Secondary | ICD-10-CM | POA: Diagnosis not present

## 2019-09-10 DIAGNOSIS — Z9181 History of falling: Secondary | ICD-10-CM | POA: Diagnosis not present

## 2019-09-10 DIAGNOSIS — I69354 Hemiplegia and hemiparesis following cerebral infarction affecting left non-dominant side: Secondary | ICD-10-CM | POA: Diagnosis not present

## 2019-09-10 DIAGNOSIS — M1990 Unspecified osteoarthritis, unspecified site: Secondary | ICD-10-CM | POA: Diagnosis not present

## 2019-09-10 DIAGNOSIS — I252 Old myocardial infarction: Secondary | ICD-10-CM | POA: Diagnosis not present

## 2019-09-10 DIAGNOSIS — F0390 Unspecified dementia without behavioral disturbance: Secondary | ICD-10-CM | POA: Diagnosis not present

## 2019-09-10 DIAGNOSIS — E119 Type 2 diabetes mellitus without complications: Secondary | ICD-10-CM | POA: Diagnosis not present

## 2019-09-10 DIAGNOSIS — Z951 Presence of aortocoronary bypass graft: Secondary | ICD-10-CM | POA: Diagnosis not present

## 2019-09-10 DIAGNOSIS — H409 Unspecified glaucoma: Secondary | ICD-10-CM | POA: Diagnosis not present

## 2019-09-10 DIAGNOSIS — Z7901 Long term (current) use of anticoagulants: Secondary | ICD-10-CM | POA: Diagnosis not present

## 2019-09-10 DIAGNOSIS — Z7982 Long term (current) use of aspirin: Secondary | ICD-10-CM | POA: Diagnosis not present

## 2019-09-10 DIAGNOSIS — R2689 Other abnormalities of gait and mobility: Secondary | ICD-10-CM | POA: Diagnosis not present

## 2019-09-10 DIAGNOSIS — I714 Abdominal aortic aneurysm, without rupture: Secondary | ICD-10-CM | POA: Diagnosis not present

## 2019-09-10 DIAGNOSIS — I6529 Occlusion and stenosis of unspecified carotid artery: Secondary | ICD-10-CM | POA: Diagnosis not present

## 2019-09-10 DIAGNOSIS — I480 Paroxysmal atrial fibrillation: Secondary | ICD-10-CM | POA: Diagnosis not present

## 2019-09-11 ENCOUNTER — Ambulatory Visit (INDEPENDENT_AMBULATORY_CARE_PROVIDER_SITE_OTHER): Payer: PPO | Admitting: Internal Medicine

## 2019-09-11 DIAGNOSIS — E7800 Pure hypercholesterolemia, unspecified: Secondary | ICD-10-CM

## 2019-09-11 DIAGNOSIS — I480 Paroxysmal atrial fibrillation: Secondary | ICD-10-CM

## 2019-09-11 DIAGNOSIS — N5089 Other specified disorders of the male genital organs: Secondary | ICD-10-CM

## 2019-09-11 DIAGNOSIS — I10 Essential (primary) hypertension: Secondary | ICD-10-CM

## 2019-09-11 DIAGNOSIS — Z8673 Personal history of transient ischemic attack (TIA), and cerebral infarction without residual deficits: Secondary | ICD-10-CM

## 2019-09-11 DIAGNOSIS — E119 Type 2 diabetes mellitus without complications: Secondary | ICD-10-CM

## 2019-09-11 DIAGNOSIS — I2510 Atherosclerotic heart disease of native coronary artery without angina pectoris: Secondary | ICD-10-CM | POA: Diagnosis not present

## 2019-09-11 NOTE — Progress Notes (Signed)
Patient ID: Kyle Richardson, male   DOB: [birth year], 85 y.o.   MRN: [id number]   Virtual Visit via video Note  This visit type was conducted due to national recommendations for restrictions regarding the COVID-19 pandemic (e.g. social distancing).  This format is felt to be most appropriate for this patient at this time.  All issues noted in this document were discussed and addressed.  No physical exam was performed (except for noted visual exam findings with Video Visits).   I connected with [name] today by a video enabled telemedicine application and verified that I am speaking with the correct person using two identifiers. Location patient: home Location provider: work or home office Persons participating in the virtual visit: patient, provider and [name])  I discussed the limitations, risks, security and privacy concerns of performing an evaluation and management service by video and the availability of in person appointments. The patient expressed understanding and agreed to proceed.   Reason for visit: scheduled follow up.   HPI: Reports he is doing better.  History obtained mostly from wife.  Pt confirm no significant pain or problems.  Was admitted 07-26-09/23/20 and diagnosed with sepsis/acute hypoxic resp failure.  These symptoms resolved and he was doing well.  Recently started having a cough and [facility] prescribed zpak.  Wife reports he is doing better.  Cough is better.  Denies sob or chest pain.  No abdominal pain or cramping.  Oxygen level 98% on room air.  Eating.  No nausea or vomiting.  Bowels moving.  Caretakers still coming to help.  The previous swelling in the scrotum has resolved.  No pain.    ROS: See pertinent positives and negatives per HPI.  Past Medical History:  Diagnosis Date  . Abdominal fibromatosis   . 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  s/p PTCA of the mid RCA 1/96, also  s/p  CABG x 4 7/96  .  Complication of anesthesia    confusion and hallucinations for several days after receiving Versed  . Degenerative disc disease   . Dementia ([place])   . Diabetes mellitus ([place])   . Glaucoma   . Hypercholesterolemia   . Hypertension   . Migraine headache    history of  . Myocardial infarction ([place]) 1996  . Osteoarthritis   . Sleep apnea   . Stroke [facility]) 02/22/2017   affected left side     Past Surgical History:  Procedure Laterality Date  . APPENDECTOMY    . CAROTID ANGIOGRAPHY Bilateral 04/19/2018   Procedure: CAROTID ANGIOGRAPHY;  Surgeon: Algernon Huxley, MD;  Location: [place] [facility];  Service: Cardiovascular;  Laterality: Bilateral;  . CAROTID PTA/STENT INTERVENTION Right 05/22/2018   Procedure: CAROTID PTA/STENT INTERVENTION;  Surgeon: Katha Cabal, MD;  Location: [place] [facility];  Service: Cardiovascular;  Laterality: Right;  . CATARACT EXTRACTION W/ INTRAOCULAR LENS  IMPLANT, BILATERAL    . CHOLECYSTECTOMY     Removed  . CORONARY ARTERY BYPASS GRAFT  7/96   x4  . HEMORRHOID SURGERY    . HERNIA REPAIR    . TONSILLECTOMY    . UPP and tonsillectomy  1/86    Family History  Problem Relation Age of Onset  . Heart disease Mother        died age 85  . Heart disease Father        and other vascular disease  . Diabetes Other  four siblings  . Heart disease Brother        s/p CABG  . Colon cancer Neg Hx   . Prostate cancer Neg Hx     SOCIAL HX: reviewed.    Current Outpatient Medications:  .  ALPRAZolam (XANAX) 0.25 MG tablet, Take 0.25-0.5 mg by mouth 2 (two) times daily as needed for anxiety or sleep. , Disp: , Rfl:  .  apixaban (ELIQUIS) 5 MG TABS tablet, Take 1 tablet (5 mg total) by mouth 2 (two) times daily., Disp: 30 tablet, Rfl: 1 .  aspirin EC 81 MG EC tablet, Take 1 tablet (81 mg total) by mouth daily., Disp: 180 tablet, Rfl: 2 .  atorvastatin (LIPITOR) 40 MG tablet, TAKE ONE TABLET BY MOUTH AT BEDTIME, Disp: 90 tablet, Rfl: 1 .   divalproex (DEPAKOTE SPRINKLE) 125 MG capsule, Take 125-250 mg by mouth 2 (two) times daily. One capsule at 1300-1400 and two capsules at bedtime, Disp: , Rfl:  .  fluticasone (FLONASE) 50 MCG/ACT nasal spray, Place 2 sprays into both nostrils daily., Disp: 16 g, Rfl: 2 .  latanoprost (XALATAN) 0.005 % ophthalmic solution, Place 1 drop into both eyes at bedtime., Disp: , Rfl:  .  loratadine (CLARITIN) 10 MG tablet, Take 10 mg by mouth daily., Disp: , Rfl:  .  Melatonin ER 5 MG TBCR, Take 5 mg by mouth at bedtime. , Disp: , Rfl:  .  metoprolol succinate (TOPROL-XL) 25 MG 24 hr tablet, Take 0.5 tablets (12.5 mg total) by mouth daily., Disp: 30 tablet, Rfl: 1 .  nitroGLYCERIN (NITROSTAT) 0.4 MG SL tablet, Place 0.4 mg under the tongue every 5 (five) minutes as needed for chest pain. , Disp: , Rfl:  .  sitaGLIPtin (JANUVIA) 25 MG tablet, Take 1 tablet (25 mg total) by mouth daily., Disp: 30 tablet, Rfl: 0 .  tiZANidine (ZANAFLEX) 4 MG tablet, Take 1 tablet (4 mg total) by mouth daily as needed for muscle spasms., Disp: 30 tablet, Rfl: 5 .  traZODone (DESYREL) 50 MG tablet, Take 50 mg by mouth at bedtime. , Disp: , Rfl:   EXAM:  VITALS per patient if applicable: [id number]  GENERAL: alert, oriented, appears well and in no acute distress  HEENT: atraumatic, conjunttiva clear, no obvious abnormalities on inspection of external nose and ears  NECK: normal movements of the head and neck  LUNGS: on inspection no signs of respiratory distress, breathing rate appears normal, no obvious gross SOB, gasping or wheezing  CV: no obvious cyanosis  PSYCH/NEURO: pleasant and cooperative, no obvious depression or anxiety, speech and thought processing grossly intact  ASSESSMENT AND PLAN:  Discussed the following assessment and plan:  CAD (coronary artery disease) Followed by cardiology.  Continue risk factor modification.  Currently no complaints of pain or other acute symptoms.    Diabetes mellitus type 2 in  nonobese (HCC) States sugars are doing better.  States sugars range 140-198.  Much improved.    History of CVA (cerebrovascular accident) Continues with difficulty communicating.  Overall stable.  Follow.    Hypercholesterolemia On lipitor.  Low cholesterol diet and exercise.  Follow lipid panel and liver function tests.    Hypertension Blood pressure has been doing well.  Continue current medication regimen.  Follow pressures.  Follow metabolic panel.    PAF (paroxysmal atrial fibrillation) ([place]) Followed by cardiology.  Stable.    Scrotal edema Resolved.  Doing well.  Does not feel needs urology evaluation at this time.  Follow.  I discussed the assessment and treatment plan with the patient. The patient was provided an opportunity to ask questions and all were answered. The patient agreed with the plan and demonstrated an understanding of the instructions.   The patient was advised to call back or seek an in-person evaluation if the symptoms worsen or if the condition fails to improve as anticipated.   Einar Pheasant, MD

## 2019-09-14 ENCOUNTER — Encounter: Payer: Self-pay | Admitting: Internal Medicine

## 2019-09-14 NOTE — Assessment & Plan Note (Signed)
Followed by cardiology.  Continue risk factor modification.  Currently no complaints of pain or other acute symptoms.

## 2019-09-14 NOTE — Assessment & Plan Note (Signed)
Continues with difficulty communicating.  Overall stable.  Follow.

## 2019-09-14 NOTE — Assessment & Plan Note (Signed)
On lipitor.  Low cholesterol diet and exercise.  Follow lipid panel and liver function tests.   

## 2019-09-14 NOTE — Assessment & Plan Note (Signed)
States sugars are doing better.  States sugars range 140-198.  Much improved.

## 2019-09-14 NOTE — Assessment & Plan Note (Signed)
Followed by cardiology. Stable.   

## 2019-09-14 NOTE — Assessment & Plan Note (Signed)
Resolved.  Doing well.  Does not feel needs urology evaluation at this time.  Follow.

## 2019-09-14 NOTE — Assessment & Plan Note (Signed)
Blood pressure has been doing well.  Continue current medication regimen.  Follow pressures.  Follow metabolic panel.  

## 2019-09-16 DIAGNOSIS — Z951 Presence of aortocoronary bypass graft: Secondary | ICD-10-CM | POA: Diagnosis not present

## 2019-09-16 DIAGNOSIS — R2689 Other abnormalities of gait and mobility: Secondary | ICD-10-CM | POA: Diagnosis not present

## 2019-09-16 DIAGNOSIS — Z9181 History of falling: Secondary | ICD-10-CM | POA: Diagnosis not present

## 2019-09-16 DIAGNOSIS — Z7901 Long term (current) use of anticoagulants: Secondary | ICD-10-CM | POA: Diagnosis not present

## 2019-09-16 DIAGNOSIS — Z7982 Long term (current) use of aspirin: Secondary | ICD-10-CM | POA: Diagnosis not present

## 2019-09-16 DIAGNOSIS — I6529 Occlusion and stenosis of unspecified carotid artery: Secondary | ICD-10-CM | POA: Diagnosis not present

## 2019-09-16 DIAGNOSIS — M1990 Unspecified osteoarthritis, unspecified site: Secondary | ICD-10-CM | POA: Diagnosis not present

## 2019-09-16 DIAGNOSIS — E119 Type 2 diabetes mellitus without complications: Secondary | ICD-10-CM | POA: Diagnosis not present

## 2019-09-16 DIAGNOSIS — I480 Paroxysmal atrial fibrillation: Secondary | ICD-10-CM | POA: Diagnosis not present

## 2019-09-16 DIAGNOSIS — I714 Abdominal aortic aneurysm, without rupture: Secondary | ICD-10-CM | POA: Diagnosis not present

## 2019-09-16 DIAGNOSIS — H409 Unspecified glaucoma: Secondary | ICD-10-CM | POA: Diagnosis not present

## 2019-09-16 DIAGNOSIS — F0390 Unspecified dementia without behavioral disturbance: Secondary | ICD-10-CM | POA: Diagnosis not present

## 2019-09-16 DIAGNOSIS — I2510 Atherosclerotic heart disease of native coronary artery without angina pectoris: Secondary | ICD-10-CM | POA: Diagnosis not present

## 2019-09-16 DIAGNOSIS — I69354 Hemiplegia and hemiparesis following cerebral infarction affecting left non-dominant side: Secondary | ICD-10-CM | POA: Diagnosis not present

## 2019-09-16 DIAGNOSIS — I10 Essential (primary) hypertension: Secondary | ICD-10-CM | POA: Diagnosis not present

## 2019-09-16 DIAGNOSIS — I252 Old myocardial infarction: Secondary | ICD-10-CM | POA: Diagnosis not present

## 2019-09-16 DIAGNOSIS — M6281 Muscle weakness (generalized): Secondary | ICD-10-CM | POA: Diagnosis not present

## 2019-10-13 ENCOUNTER — Telehealth: Payer: Self-pay | Admitting: Internal Medicine

## 2019-10-13 ENCOUNTER — Other Ambulatory Visit: Payer: Self-pay | Admitting: Internal Medicine

## 2019-10-13 NOTE — Telephone Encounter (Signed)
Pt needs refill on tiZANidine (ZANAFLEX) 4 MG tablet

## 2019-10-13 NOTE — Telephone Encounter (Signed)
Pt needs a refill on his test stripes, [employer] .

## 2019-10-14 ENCOUNTER — Other Ambulatory Visit: Payer: Self-pay

## 2019-10-14 MED ORDER — TIZANIDINE HCL 4 MG PO TABS: 4.0000 mg | ORAL_TABLET | Freq: Every day | ORAL | 5 refills | Status: AC | PRN

## 2019-10-14 NOTE — Telephone Encounter (Signed)
Medication refilled

## 2019-10-22 ENCOUNTER — Other Ambulatory Visit: Payer: Self-pay

## 2019-10-22 MED ORDER — GLUCOSE BLOOD VI STRP: ORAL_STRIP | 12 refills | Status: DC

## 2019-10-22 MED ORDER — ONETOUCH ULTRASOFT LANCETS MISC: 12 refills | Status: DC

## 2019-10-22 NOTE — Telephone Encounter (Signed)
Rx sent in

## 2019-10-25 DIAGNOSIS — A0472 Enterocolitis due to Clostridium difficile, not specified as recurrent: Secondary | ICD-10-CM | POA: Diagnosis not present

## 2019-10-25 DIAGNOSIS — R531 Weakness: Secondary | ICD-10-CM | POA: Diagnosis not present

## 2019-10-30 ENCOUNTER — Other Ambulatory Visit: Payer: Self-pay | Admitting: Internal Medicine

## 2019-11-03 ENCOUNTER — Telehealth: Payer: Self-pay | Admitting: Internal Medicine

## 2019-11-03 NOTE — Telephone Encounter (Signed)
Letter written

## 2019-11-08 ENCOUNTER — Emergency Department: Payer: PPO

## 2019-11-08 ENCOUNTER — Inpatient Hospital Stay
Admission: EM | Admit: 2019-11-08 | Discharge: 2019-11-09 | DRG: 177 | Disposition: A | Discharge status: Other Acute Inpatient Hospital | Payer: PPO | Attending: Internal Medicine | Admitting: Internal Medicine

## 2019-11-08 DIAGNOSIS — E7800 Pure hypercholesterolemia, unspecified: Secondary | ICD-10-CM | POA: Diagnosis present

## 2019-11-08 DIAGNOSIS — Z833 Family history of diabetes mellitus: Secondary | ICD-10-CM

## 2019-11-08 DIAGNOSIS — Z881 Allergy status to other antibiotic agents status: Secondary | ICD-10-CM

## 2019-11-08 DIAGNOSIS — Z888 Allergy status to other drugs, medicaments and biological substances status: Secondary | ICD-10-CM | POA: Diagnosis not present

## 2019-11-08 DIAGNOSIS — G9341 Metabolic encephalopathy: Secondary | ICD-10-CM | POA: Diagnosis present

## 2019-11-08 DIAGNOSIS — I2510 Atherosclerotic heart disease of native coronary artery without angina pectoris: Secondary | ICD-10-CM | POA: Diagnosis present

## 2019-11-08 DIAGNOSIS — Z885 Allergy status to narcotic agent status: Secondary | ICD-10-CM | POA: Diagnosis not present

## 2019-11-08 DIAGNOSIS — Z9861 Coronary angioplasty status: Secondary | ICD-10-CM

## 2019-11-08 DIAGNOSIS — I252 Old myocardial infarction: Secondary | ICD-10-CM

## 2019-11-08 DIAGNOSIS — Z91040 Latex allergy status: Secondary | ICD-10-CM | POA: Diagnosis not present

## 2019-11-08 DIAGNOSIS — J9601 Acute respiratory failure with hypoxia: Secondary | ICD-10-CM | POA: Diagnosis not present

## 2019-11-08 DIAGNOSIS — E119 Type 2 diabetes mellitus without complications: Secondary | ICD-10-CM | POA: Diagnosis present

## 2019-11-08 DIAGNOSIS — E785 Hyperlipidemia, unspecified: Secondary | ICD-10-CM | POA: Diagnosis present

## 2019-11-08 DIAGNOSIS — I480 Paroxysmal atrial fibrillation: Secondary | ICD-10-CM | POA: Diagnosis not present

## 2019-11-08 DIAGNOSIS — Z7982 Long term (current) use of aspirin: Secondary | ICD-10-CM

## 2019-11-08 DIAGNOSIS — R069 Unspecified abnormalities of breathing: Secondary | ICD-10-CM | POA: Diagnosis not present

## 2019-11-08 DIAGNOSIS — R404 Transient alteration of awareness: Secondary | ICD-10-CM | POA: Diagnosis not present

## 2019-11-08 DIAGNOSIS — Z7901 Long term (current) use of anticoagulants: Secondary | ICD-10-CM

## 2019-11-08 DIAGNOSIS — J1282 Pneumonia due to coronavirus disease 2019: Secondary | ICD-10-CM | POA: Diagnosis not present

## 2019-11-08 DIAGNOSIS — N179 Acute kidney failure, unspecified: Secondary | ICD-10-CM | POA: Diagnosis not present

## 2019-11-08 DIAGNOSIS — U071 COVID-19: Secondary | ICD-10-CM | POA: Diagnosis not present

## 2019-11-08 DIAGNOSIS — H409 Unspecified glaucoma: Secondary | ICD-10-CM | POA: Diagnosis present

## 2019-11-08 DIAGNOSIS — J189 Pneumonia, unspecified organism: Secondary | ICD-10-CM | POA: Diagnosis not present

## 2019-11-08 DIAGNOSIS — R0602 Shortness of breath: Secondary | ICD-10-CM | POA: Diagnosis not present

## 2019-11-08 DIAGNOSIS — Z8249 Family history of ischemic heart disease and other diseases of the circulatory system: Secondary | ICD-10-CM

## 2019-11-08 DIAGNOSIS — Z951 Presence of aortocoronary bypass graft: Secondary | ICD-10-CM | POA: Diagnosis not present

## 2019-11-08 DIAGNOSIS — R52 Pain, unspecified: Secondary | ICD-10-CM | POA: Diagnosis not present

## 2019-11-08 DIAGNOSIS — F419 Anxiety disorder, unspecified: Secondary | ICD-10-CM | POA: Diagnosis not present

## 2019-11-08 DIAGNOSIS — Z79899 Other long term (current) drug therapy: Secondary | ICD-10-CM

## 2019-11-08 DIAGNOSIS — R778 Other specified abnormalities of plasma proteins: Secondary | ICD-10-CM | POA: Diagnosis present

## 2019-11-08 DIAGNOSIS — F0390 Unspecified dementia without behavioral disturbance: Secondary | ICD-10-CM | POA: Diagnosis present

## 2019-11-08 DIAGNOSIS — I10 Essential (primary) hypertension: Secondary | ICD-10-CM | POA: Diagnosis present

## 2019-11-08 DIAGNOSIS — J069 Acute upper respiratory infection, unspecified: Secondary | ICD-10-CM | POA: Diagnosis present

## 2019-11-08 DIAGNOSIS — Z8673 Personal history of transient ischemic attack (TIA), and cerebral infarction without residual deficits: Secondary | ICD-10-CM | POA: Diagnosis not present

## 2019-11-08 LAB — COMPREHENSIVE METABOLIC PANEL
ALT: 18 U/L (ref 0–44)
AST: 32 U/L (ref 15–41)
Albumin: 3.1 g/dL — ABNORMAL LOW (ref 3.5–5.0)
Alkaline Phosphatase: 63 U/L (ref 38–126)
Anion gap: 11 (ref 5–15)
BUN: 30 mg/dL — ABNORMAL HIGH (ref 8–23)
CO2: 25 mmol/L (ref 22–32)
Calcium: 8.7 mg/dL — ABNORMAL LOW (ref 8.9–10.3)
Chloride: 107 mmol/L (ref 98–111)
Creatinine, Ser: 1.37 mg/dL — ABNORMAL HIGH (ref 0.61–1.24)
GFR calc Af Amer: 53 mL/min — ABNORMAL LOW (ref >60)
GFR calc non Af Amer: 45 mL/min — ABNORMAL LOW (ref >60)
Glucose, Bld: 151 mg/dL — ABNORMAL HIGH (ref 70–99)
Potassium: 4.2 mmol/L (ref 3.5–5.1)
Sodium: 143 mmol/L (ref 135–145)
Total Bilirubin: 0.8 mg/dL (ref 0.3–1.2)
Total Protein: 7.3 g/dL (ref 6.5–8.1)

## 2019-11-08 LAB — CBC WITH DIFFERENTIAL/PLATELET
Abs Immature Granulocytes: 0.05 10*3/uL (ref 0.00–0.07)
Basophils Absolute: 0 10*3/uL (ref 0.0–0.1)
Basophils Relative: 0 %
Eosinophils Absolute: 0.2 10*3/uL (ref 0.0–0.5)
Eosinophils Relative: 3 %
HCT: 48.8 % (ref 39.0–52.0)
Hemoglobin: 15.8 g/dL (ref 13.0–17.0)
Immature Granulocytes: 1 %
Lymphocytes Relative: 12 %
Lymphs Abs: 0.9 10*3/uL (ref 0.7–4.0)
MCH: 31.5 pg (ref 26.0–34.0)
MCHC: 32.4 g/dL (ref 30.0–36.0)
MCV: 97.2 fL (ref 80.0–100.0)
Monocytes Absolute: 0.3 10*3/uL (ref 0.1–1.0)
Monocytes Relative: 4 %
Neutro Abs: 6 10*3/uL (ref 1.7–7.7)
Neutrophils Relative %: 80 %
Platelets: 181 10*3/uL (ref 150–400)
RBC: 5.02 MIL/uL (ref 4.22–5.81)
RDW: 12.6 % (ref 11.5–15.5)
WBC: 7.4 10*3/uL (ref 4.0–10.5)
nRBC: 0 % (ref 0.0–0.2)

## 2019-11-08 LAB — LACTATE DEHYDROGENASE: LDH: 446 U/L — ABNORMAL HIGH (ref 98–192)

## 2019-11-08 LAB — FERRITIN: Ferritin: 346 ng/mL — ABNORMAL HIGH (ref 24–336)

## 2019-11-08 LAB — FIBRIN DERIVATIVES D-DIMER (ARMC ONLY): Fibrin derivatives D-dimer (ARMC): 1020.07 ng/mL (FEU) — ABNORMAL HIGH (ref 0.00–499.00)

## 2019-11-08 LAB — TROPONIN I (HIGH SENSITIVITY)
Troponin I (High Sensitivity): 35 ng/L — ABNORMAL HIGH (ref <18)
Troponin I (High Sensitivity): 25 ng/L — ABNORMAL HIGH (ref <18)
Troponin I (High Sensitivity): 19 ng/L — ABNORMAL HIGH (ref <18)

## 2019-11-08 LAB — BRAIN NATRIURETIC PEPTIDE: B Natriuretic Peptide: 104 pg/mL — ABNORMAL HIGH (ref 0.0–100.0)

## 2019-11-08 LAB — TRIGLYCERIDES: Triglycerides: 135 mg/dL (ref <150)

## 2019-11-08 LAB — PROCALCITONIN: Procalcitonin: 0.23 ng/mL

## 2019-11-08 LAB — POC SARS CORONAVIRUS 2 AG: SARS Coronavirus 2 Ag: POSITIVE — AB

## 2019-11-08 LAB — FIBRINOGEN: Fibrinogen: >750 mg/dL — ABNORMAL HIGH (ref 210–475)

## 2019-11-08 LAB — LACTIC ACID, PLASMA: Lactic Acid, Venous: 1.6 mmol/L (ref 0.5–1.9)

## 2019-11-08 LAB — C-REACTIVE PROTEIN: CRP: 28 mg/dL — ABNORMAL HIGH (ref <1.0)

## 2019-11-08 MED ORDER — ALPRAZOLAM 0.5 MG PO TABS: 0.2500 mg | ORAL_TABLET | Freq: Three times a day (TID) | ORAL | Status: DC | PRN

## 2019-11-08 MED ORDER — MELATONIN 5 MG PO TABS
10.0000 mg | ORAL_TABLET | Freq: Every day | ORAL | Status: DC
  Administered 2019-11-09: 10 mg via ORAL
  Filled 2019-11-08: qty 2

## 2019-11-08 MED ORDER — ASPIRIN 81 MG PO CHEW: 81.0000 mg | CHEWABLE_TABLET | Freq: Every day | ORAL | Status: DC

## 2019-11-08 MED ORDER — DEXAMETHASONE SODIUM PHOSPHATE 10 MG/ML IJ SOLN
8.0000 mg | Freq: Once | INTRAMUSCULAR | Status: AC
  Administered 2019-11-08: 8 mg via INTRAVENOUS
  Filled 2019-11-08: qty 1

## 2019-11-08 MED ORDER — SODIUM CHLORIDE 0.9 % IV SOLN
100.0000 mg | Freq: Every day | INTRAVENOUS | Status: DC
  Filled 2019-11-08: qty 20

## 2019-11-08 MED ORDER — ONDANSETRON HCL 4 MG/2ML IJ SOLN: 4.0000 mg | Freq: Three times a day (TID) | INTRAMUSCULAR | Status: DC | PRN

## 2019-11-08 MED ORDER — SODIUM CHLORIDE 0.9 % IV SOLN: INTRAVENOUS | Status: DC

## 2019-11-08 MED ORDER — METOPROLOL TARTRATE 25 MG PO TABS: 12.5000 mg | ORAL_TABLET | Freq: Two times a day (BID) | ORAL | Status: DC

## 2019-11-08 MED ORDER — SODIUM CHLORIDE 0.9 % IV SOLN
200.0000 mg | Freq: Once | INTRAVENOUS | Status: AC
  Administered 2019-11-08: 200 mg via INTRAVENOUS
  Filled 2019-11-08: qty 200

## 2019-11-08 MED ORDER — ZINC SULFATE 220 (50 ZN) MG PO CAPS
220.0000 mg | ORAL_CAPSULE | Freq: Every day | ORAL | Status: DC
  Administered 2019-11-08: 220 mg via ORAL
  Filled 2019-11-08: qty 1

## 2019-11-08 MED ORDER — DIVALPROEX SODIUM 125 MG PO CSDR: 125.0000 mg | DELAYED_RELEASE_CAPSULE | Freq: Every day | ORAL | Status: DC

## 2019-11-08 MED ORDER — ALPRAZOLAM 0.5 MG PO TABS
0.2500 mg | ORAL_TABLET | Freq: Once | ORAL | Status: AC
  Administered 2019-11-08: 0.25 mg via ORAL
  Filled 2019-11-08: qty 1

## 2019-11-08 MED ORDER — ALPRAZOLAM 0.5 MG PO TABS
0.5000 mg | ORAL_TABLET | Freq: Three times a day (TID) | ORAL | Status: DC
  Administered 2019-11-09: 0.5 mg via ORAL
  Filled 2019-11-08: qty 1

## 2019-11-08 MED ORDER — TIZANIDINE HCL 4 MG PO TABS
4.0000 mg | ORAL_TABLET | Freq: Every day | ORAL | Status: DC | PRN
  Filled 2019-11-08: qty 1

## 2019-11-08 MED ORDER — VALPROIC ACID 250 MG/5ML PO SOLN
187.5000 mg | Freq: Every day | ORAL | Status: DC
  Administered 2019-11-09: 187.5 mg via ORAL
  Filled 2019-11-08: qty 5

## 2019-11-08 MED ORDER — METHYLPREDNISOLONE SODIUM SUCC 40 MG IJ SOLR: 40.0000 mg | Freq: Two times a day (BID) | INTRAMUSCULAR | Status: DC

## 2019-11-08 MED ORDER — AEROCHAMBER PLUS FLO-VU LARGE MISC
1.0000 | Freq: Once | Status: DC
  Administered 2019-11-08: 1
  Filled 2019-11-08: qty 1

## 2019-11-08 MED ORDER — AEROCHAMBER PLUS FLO-VU LARGE MISC
1.0000 | Freq: Once | Status: AC
  Administered 2019-11-08: 1
  Filled 2019-11-08: qty 1

## 2019-11-08 MED ORDER — INSULIN ASPART 100 UNIT/ML ~~LOC~~ SOLN: 0.0000 [IU] | Freq: Three times a day (TID) | SUBCUTANEOUS | Status: DC

## 2019-11-08 MED ORDER — TRAZODONE HCL 50 MG PO TABS
50.0000 mg | ORAL_TABLET | Freq: Every day | ORAL | Status: DC
  Administered 2019-11-09: 50 mg via ORAL
  Filled 2019-11-08: qty 1

## 2019-11-08 MED ORDER — SODIUM CHLORIDE 0.9 % IV BOLUS
500.0000 mL | Freq: Once | INTRAVENOUS | Status: AC
  Administered 2019-11-08: 500 mL via INTRAVENOUS

## 2019-11-08 MED ORDER — LORATADINE 10 MG PO TABS: 10.0000 mg | ORAL_TABLET | Freq: Every day | ORAL | Status: DC

## 2019-11-08 MED ORDER — IPRATROPIUM BROMIDE HFA 17 MCG/ACT IN AERS
2.0000 | INHALATION_SPRAY | RESPIRATORY_TRACT | Status: DC
  Administered 2019-11-08 – 2019-11-09 (×3): 2 via RESPIRATORY_TRACT
  Filled 2019-11-08 (×2): qty 12.9

## 2019-11-08 MED ORDER — LATANOPROST 0.005 % OP SOLN
1.0000 [drp] | Freq: Every day | OPHTHALMIC | Status: DC
  Administered 2019-11-09: 1 [drp] via OPHTHALMIC
  Filled 2019-11-08: qty 2.5

## 2019-11-08 MED ORDER — ACETAMINOPHEN 325 MG PO TABS
650.0000 mg | ORAL_TABLET | Freq: Four times a day (QID) | ORAL | Status: DC | PRN
  Administered 2019-11-09: 650 mg via ORAL
  Filled 2019-11-08: qty 2

## 2019-11-08 MED ORDER — DIVALPROEX SODIUM 125 MG PO CSDR: 125.0000 mg | DELAYED_RELEASE_CAPSULE | Freq: Two times a day (BID) | ORAL | Status: DC

## 2019-11-08 MED ORDER — ATORVASTATIN CALCIUM 20 MG PO TABS
40.0000 mg | ORAL_TABLET | Freq: Every day | ORAL | Status: DC
  Administered 2019-11-09: 40 mg via ORAL
  Filled 2019-11-08: qty 2

## 2019-11-08 MED ORDER — ASCORBIC ACID 500 MG PO TABS
500.0000 mg | ORAL_TABLET | Freq: Every day | ORAL | Status: DC
  Administered 2019-11-08: 500 mg via ORAL
  Filled 2019-11-08: qty 1

## 2019-11-08 MED ORDER — DM-GUAIFENESIN ER 30-600 MG PO TB12
1.0000 | ORAL_TABLET | Freq: Two times a day (BID) | ORAL | Status: DC
  Administered 2019-11-08: 1 via ORAL
  Filled 2019-11-08: qty 1

## 2019-11-08 MED ORDER — ALBUTEROL SULFATE HFA 108 (90 BASE) MCG/ACT IN AERS
2.0000 | INHALATION_SPRAY | RESPIRATORY_TRACT | Status: DC | PRN
  Administered 2019-11-08: 2 via RESPIRATORY_TRACT
  Filled 2019-11-08 (×2): qty 6.7

## 2019-11-08 MED ORDER — DIVALPROEX SODIUM 125 MG PO CSDR
125.0000 mg | DELAYED_RELEASE_CAPSULE | Freq: Every day | ORAL | Status: DC
  Filled 2019-11-08: qty 1

## 2019-11-08 MED ORDER — APIXABAN 5 MG PO TABS
5.0000 mg | ORAL_TABLET | Freq: Two times a day (BID) | ORAL | Status: DC
  Administered 2019-11-09: 5 mg via ORAL
  Filled 2019-11-08: qty 1

## 2019-11-08 MED ORDER — INSULIN ASPART 100 UNIT/ML ~~LOC~~ SOLN
0.0000 [IU] | Freq: Every day | SUBCUTANEOUS | Status: DC
  Administered 2019-11-09: 2 [IU] via SUBCUTANEOUS
  Filled 2019-11-08: qty 1

## 2019-11-08 MED ORDER — HYDRALAZINE HCL 50 MG PO TABS: 25.0000 mg | ORAL_TABLET | Freq: Three times a day (TID) | ORAL | Status: DC | PRN

## 2019-11-08 MED ORDER — DIVALPROEX SODIUM 125 MG PO CSDR: 187.5000 mg | DELAYED_RELEASE_CAPSULE | Freq: Every day | ORAL | Status: DC

## 2019-11-08 MED ORDER — NITROGLYCERIN 0.4 MG SL SUBL: 0.4000 mg | SUBLINGUAL_TABLET | SUBLINGUAL | Status: DC | PRN

## 2019-11-08 MED ORDER — FLUTICASONE PROPIONATE 50 MCG/ACT NA SUSP
2.0000 | Freq: Every day | NASAL | Status: DC
  Filled 2019-11-08: qty 16

## 2019-11-08 NOTE — ED Notes (Signed)
Pt repositioned and provided with comfort measures, fresh linen and pillows.  Attempted to update pt on plan of care, pt unable to verbalize understanding.  Pt remains restless and thrashing about bed.   No other acute s/s of distress noted at this time will continue to monitor/reassess.

## 2019-11-08 NOTE — ED Notes (Signed)
Pt begins moving around again and getting tangled in the bed and cords. Pt's wife states that he does sundown and normally takes xanax for this. MD notified. Pt given meds in applesauce with no trouble swallowing. Will continue to monitor for need of a 1:1 sitter.

## 2019-11-08 NOTE — H&P (Addendum)
History and Physical    Kyle Richardson [id number] DOB: 1928-04-03 DOA: 11/08/2019  Referring MD/NP/PA:   PCP: Einar Pheasant, MD   Patient coming from:  The patient is coming from SNF.  At baseline, pt is dependent for most of ADL.        Chief Complaint: Shortness of breath,  HPI: Kyle Richardson is a 86 y.o. male with medical history significant of hypertension, hyperlipidemia, diabetes mellitus, stroke, anxiety, CAD, dementia, PAF on Eliquis, ocular myasthenia gravis, who presents with shortness of breath.  Per her daughter (I called her daughter by phone), patient has been having cough for several days, he had positive COVID-19 test on Monday.  Patient has a dry cough, no chest pain.  His shortness breath has been progressively worsening.  She was found to have oxygen desaturation to 70% on room air, needed high flow oxygen in ED.  Patient has been having diarrhea, but no nausea vomiting or abdominal pain per her daughter. Pt is more confused and agitated today. Per her daughter, patient usually intermittently gets confused in the late afternoon at home even when he was in her normal health condition.  In the situation, per her daughter, usually patient was given prn Xanax with good control of his symptoms.  ED Course: pt was found to have positive Covid 19 Ag test, AKI with creatinine 1.37, BUN 30, troponin 35, WBC 7.4, temperature normal, blood pressure 151/80, heart rate 66, RR 22, oxygen saturation 97% on high flow nasal cannula oxygen.  Chest x-ray showed bilateral multifocal infiltration.  Review of Systems: Could not be reviewed due to dementia  Allergy:  Allergies  Allergen Reactions  . Latex Swelling  . Fentanyl Other (See Comments)    Reaction: confusion  . Flomax [Tamsulosin Hcl] Other (See Comments)    Unknown  . Lamisil [Terbinafine] Other (See Comments)    Unknown  . Levofloxacin Other (See Comments)    Diplopia. NEVER put patient on levoquin!  . Lorazepam Other  (See Comments)    Reaction: confusion/ hallucinations   . Metformin And Related Diarrhea  . Sertraline Other (See Comments)    Unknown  . Sulfa Antibiotics Other (See Comments)    Unknown  . Versed [Midazolam] Other (See Comments)    Hallucinations and confusion  . Clarithromycin Diarrhea  . Codeine Other (See Comments)    Unknown    Past Medical History:  Diagnosis Date  . Abdominal fibromatosis   . 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  s/p PTCA of the mid RCA 1/96, also  s/p  CABG x 4 7/96  . Complication of anesthesia    confusion and hallucinations for several days after receiving Versed  . Degenerative disc disease   . Dementia ([place])   . Diabetes mellitus ([place])   . Glaucoma   . Hypercholesterolemia   . Hypertension   . Migraine headache    history of  . Myocardial infarction ([place]) 1996  . Osteoarthritis   . Sleep apnea   . Stroke [facility]) 02/22/2017   affected left side     Past Surgical History:  Procedure Laterality Date  . APPENDECTOMY    . CAROTID ANGIOGRAPHY Bilateral 04/19/2018   Procedure: CAROTID ANGIOGRAPHY;  Surgeon: Algernon Huxley, MD;  Location: [facility] [facility];  Service: Cardiovascular;  Laterality: Bilateral;  . CAROTID PTA/STENT INTERVENTION Right 05/22/2018   Procedure: CAROTID PTA/STENT INTERVENTION;  Surgeon: Katha Cabal, MD;  Location: [place] [facility];  Service: Cardiovascular;  Laterality: Right;  . CATARACT EXTRACTION W/ INTRAOCULAR LENS  IMPLANT, BILATERAL    . CHOLECYSTECTOMY     Removed  . CORONARY ARTERY BYPASS GRAFT  7/96   x4  . HEMORRHOID SURGERY    . HERNIA REPAIR    . TONSILLECTOMY    . UPP and tonsillectomy  1/86    Social History:  reports that he has never smoked. He has never used smokeless tobacco. He reports that he does not drink alcohol or use drugs.  Family History:  Family History  Problem Relation Age of Onset  . Heart disease Mother        died age 6  . Heart  disease Father        and other vascular disease  . Diabetes Other        four siblings  . Heart disease Brother        s/p CABG  . Colon cancer Neg Hx   . Prostate cancer Neg Hx      Prior to Admission medications   Medication Sig Start Date End Date Taking? Authorizing Provider  ALPRAZolam Duanne Moron) 0.5 MG tablet Take 0.5 mg by mouth 3 (three) times daily.    Yes [provider]  apixaban (ELIQUIS) 5 MG TABS tablet Take 1 tablet (5 mg total) by mouth 2 (two) times daily. 04/01/18  Yes Angiulli, Lavon Paganini, PA-C  aspirin EC 81 MG EC tablet Take 1 tablet (81 mg total) by mouth daily. 06/01/18  Yes Schnier, Dolores Lory, MD  atorvastatin (LIPITOR) 40 MG tablet TAKE ONE TABLET BY MOUTH AT BEDTIME 05/30/19  Yes Einar Pheasant, MD  divalproex (DEPAKOTE SPRINKLE) 125 MG capsule Take 125-250 mg by mouth 2 (two) times daily. One capsule at 1300-1400 and one and one-half (187.5) capsules at bedtime 06/18/19  Yes [provider]  fluticasone (FLONASE) 50 MCG/ACT nasal spray Place 2 sprays into both nostrils daily. 12/13/18  Yes Guse, Jacquelynn Cree, FNP  latanoprost (XALATAN) 0.005 % ophthalmic solution Place 1 drop into both eyes at bedtime.   Yes [provider]  loratadine (CLARITIN) 10 MG tablet Take 10 mg by mouth daily.   Yes [provider]  Melatonin 10 MG TABS Take 10 mg by mouth at bedtime.   Yes [provider]  metoprolol succinate (TOPROL-XL) 25 MG 24 hr tablet TAKE 1 TABLET BY MOUTH DAILY Patient taking differently: Take 12.5 mg by mouth daily.  10/30/19  Yes Einar Pheasant, MD  nitroGLYCERIN (NITROSTAT) 0.4 MG SL tablet Place 0.4 mg under the tongue every 5 (five) minutes as needed for chest pain.    Yes [provider]  tiZANidine (ZANAFLEX) 4 MG tablet Take 1 tablet (4 mg total) by mouth daily as needed for muscle spasms. 10/14/19  Yes Einar Pheasant, MD  traZODone (DESYREL) 50 MG tablet Take 50 mg by mouth at bedtime.    Yes [provider]     Physical Exam: Vitals:   11/08/19 1711 11/08/19 1714 11/08/19 1730 11/08/19 1800  BP: 140/77  (!) 148/120 (!) 154/131  Pulse: 67  65 61  Resp: 18  13 (!) 24  Temp:      TempSrc:      SpO2: 93% 93% 92% 99%  Weight:      Height:       General: Not in acute distress HEENT:       Eyes: PERRL, EOMI, no scleral icterus.       ENT: No discharge from the  ears and nose, no pharynx injection, no tonsillar enlargement.        Neck: No JVD, no bruit, no mass felt. Heme: No neck lymph node enlargement. Cardiac: S1/S2, RRR, No murmurs, No gallops or rubs. Respiratory: No rales, wheezing, rhonchi or rubs. GI: Soft, nondistended, nontender, no rebound pain, no organomegaly, BS present. GU: No hematuria Ext: No pitting leg edema bilaterally. 2+DP/PT pulse bilaterally. Musculoskeletal: No joint deformities, No joint redness or warmth, no limitation of ROM in spin. Skin: No rashes.  Neuro: confused, not oriented X3, cranial nerves II-XII grossly intact, moves all extremities. Psych: Patient is not psychotic, no suicidal or hemocidal ideation.  Labs on Admission: I have personally reviewed following labs and imaging studies  CBC: Recent Labs  Lab 11/08/19 1353  WBC 7.4  NEUTROABS 6.0  HGB 15.8  HCT 48.8  MCV 97.2  PLT 0000000   Basic Metabolic Panel: Recent Labs  Lab 11/08/19 1353  NA 143  K 4.2  CL 107  CO2 25  GLUCOSE 151*  BUN 30*  CREATININE 1.37*  CALCIUM 8.7*   GFR: Estimated Creatinine Clearance: 40.1 mL/min (A) (by C-G formula based on SCr of 1.37 mg/dL (H)). Liver Function Tests: Recent Labs  Lab 11/08/19 1353  AST 32  ALT 18  ALKPHOS 63  BILITOT 0.8  PROT 7.3  ALBUMIN 3.1*   No results for input(s): LIPASE, AMYLASE in the last 168 hours. No results for input(s): AMMONIA in the last 168 hours. Coagulation Profile: No results for input(s): INR, PROTIME in the last 168 hours. Cardiac Enzymes: No results for input(s): CKTOTAL, CKMB, CKMBINDEX, TROPONINI in  the last 168 hours. BNP (last 3 results) No results for input(s): PROBNP in the last 8760 hours. HbA1C: No results for input(s): HGBA1C in the last 72 hours. CBG: No results for input(s): GLUCAP in the last 168 hours. Lipid Profile: Recent Labs    11/08/19 1353  TRIG 135   Thyroid Function Tests: No results for input(s): TSH, T4TOTAL, FREET4, T3FREE, THYROIDAB in the last 72 hours. Anemia Panel: Recent Labs    11/08/19 1353  FERRITIN 346*   Urine analysis:    Component Value Date/Time   COLORURINE YELLOW (A) 07/27/2019 2159   APPEARANCEUR CLEAR (A) 07/27/2019 2159   APPEARANCEUR Hazy 12/23/2012 0956   LABSPEC 1.025 07/27/2019 2159   LABSPEC 1.025 12/23/2012 0956   PHURINE 6.0 07/27/2019 2159   GLUCOSEU >=500 (A) 07/27/2019 2159   GLUCOSEU NEGATIVE 01/02/2019 [place] 07/27/2019 2159   BILIRUBINUR NEGATIVE 07/27/2019 2159   BILIRUBINUR postivie 1+ 01/28/2018 0824   BILIRUBINUR Negative 12/23/2012 0956   KETONESUR 20 (A) 07/27/2019 2159   PROTEINUR 30 (A) 07/27/2019 2159   UROBILINOGEN 0.2 01/02/2019 1227   NITRITE NEGATIVE 07/27/2019 2159   LEUKOCYTESUR NEGATIVE 07/27/2019 2159   LEUKOCYTESUR Negative 12/23/2012 0956   Sepsis Labs: @LABRCNTIP (procalcitonin:4,lacticidven:4) )No results found for this or any previous visit (from the past 240 hour(s)).   Radiological Exams on Admission: DG Chest Port 1 View  Result Date: 11/08/2019 CLINICAL DATA:  Worsening shortness of breath. COVID-19 positive. Hypertension. Coronary artery disease. EXAM: PORTABLE CHEST 1 VIEW COMPARISON:  07/31/2019 FINDINGS: Moderate right hemidiaphragm elevation. Prior median sternotomy. Midline trachea. Normal heart size. Atherosclerosis in the transverse aorta. No pleural effusion or pneumothorax. Diffuse interstitial opacities, superimposed upon chronic interstitial thickening. Relative sparing of the right lung base and left apex. IMPRESSION: Multifocal bilateral interstitial  opacities, suspicious for COVID-19 pneumonia. Congestive heart failure could look similar, but would  be expected to have cardiomegaly and pleural fluid. Aortic Atherosclerosis (ICD10-I70.0). Electronically Signed   By: Abigail Miyamoto M.D.   On: 11/08/2019 14:07     EKG: not done in ED, will get one.   Assessment/Plan Principal Problem:   Acute respiratory disease due to COVID-19 virus Active Problems:   Hypertension   Hypercholesterolemia   CAD (coronary artery disease)   PAF (paroxysmal atrial fibrillation) (HCC)   History of CVA (cerebrovascular accident)   Acute metabolic encephalopathy   AKI (acute kidney injury) ([place])   Dementia (HCC)   Elevated troponin   Acute respiratory disease due to COVID-19 virus: Patient has oxygen desaturation to 70% on room air, currently needs high flow oxygen.  Chest x-ray with bilateral multifocal infiltration.  -will admit to SDU as inpt -Remdesivir per pharm -Solumedrol 40 mg bid -vitamin C, zinc.  -Bronchodilators -PRN Mucinex for cough -f/u Blood culture -Gentle IV fluid:  -D-dimer, BNP,Trop, LFT, CRP, LDH, Procalcitonin, Ferritin, fibinogen, TG, Hep B SAg, HIV ab -Daily CRP, Ferritin, D-dimer, -Will ask the patient to maintain an awake prone position for 16+ hours a day, if possible, with a minimum of 2-3 hours at a time -Will attempt to maintain euvolemia to a net negative fluid status -Dr. Mortimer Fries of PCCM is consulted  HTN:  -Continue home medications: Metoprolol -hydralazine prn  Hypercholesterolemia -lipitor  Hx of CAD (coronary artery disease) and elevated trop: trop 35. No CP.  Likely due to COVID-19 infection -Continue aspirin, Lipitor, prn NTG -Trend troponin  PAF (paroxysmal atrial fibrillation) (HCC) : -continue Eliquis and metoprolol  History of CVA (cerebrovascular accident): -on ASA, Eliquis and lipitor  Acute metabolic encephalopathy in the setting of dementia and possible delirium -Frequent neuro check -will  get CT-head since pt is on  -prn Xanax   AKI (acute kidney injury) ([place]): -IVF: 500 cc of NS, then 75 cc/h    Inpatient status:  # Patient requires inpatient status due to high intensity of service, high risk for further deterioration and high frequency of surveillance required.  I certify that at the point of admission it is my clinical judgment that the patient will require inpatient hospital care spanning beyond 2 midnights from the point of admission.  . This patient has multiple chronic comorbidities including hypertension, hyperlipidemia, diabetes mellitus, stroke, anxiety, CAD, dementia, PAF on Eliquis, ocular myasthenia gravis .  Marland Kitchen Now patient has presenting with acute respiratory disease due to COVID-19 virus. Also has AKI, elevated troponin, and worsening mental status . The initial radiographic and laboratory data are worrisome because of positive Covid Ag test, AKI, chest x-ray showed bilateral multifocal infiltration. . Current medical needs: please see my assessment and plan . Predictability of an adverse outcome (risk): Patient has multiple comorbidities as listed above. Now presents with acute respiratory disease due to COVID-19 virus. Also has AKI, elevated troponin, and worsening mental status. Patient's presentation is highly complicated.  Patient is at high risk of deteriorating.  Will need to be treated in hospital for at least 2 days.            DVT ppx: on Eliquis Code Status: Full code Family Communication:   Yes, patient's  Daughter by phone Disposition Plan:  Anticipate discharge to SNF Consults called:  Message sent to Dr. Mortimer Fries of ICU for consult Admission status:  SDU/inpation       Date of Service 11/08/2019    [place] [facility]   If 7PM-7AM, please contact night-coverage [email or web address] Password [facility] 11/08/2019, 6:12  PM

## 2019-11-08 NOTE — ED Notes (Signed)
Pt has hearing aid battery in pink box at bedside.

## 2019-11-08 NOTE — ED Notes (Signed)
Mittens placed on pt to prevent removing of HFNC.

## 2019-11-08 NOTE — ED Notes (Signed)
Asked to sit with pt. While with pt pt did not try to get up remained calm but kept asking for wife and to talk to her. Nurse notified of situation and sitter 1:1 removed.

## 2019-11-08 NOTE — ED Triage Notes (Signed)
Pt BIBA secondary to positive covid test on Monday with worsening SOB.  Found at home with spo2 in the 64s on room air.  Last known well at 0330.  Normally responsive now only minimally responsive to noxious stimuli.  Pt presented via gurney on 15lpm via NRM.  20g saline lock to rt wrist pta.  Pt spo2 currently 98% on 15lpm.  Dr. Corky Downs and team at bedside for assessment.

## 2019-11-08 NOTE — Progress Notes (Signed)
CARE PLAN  86 y.o. male brought in for shortness of breath.  Reported recent positive COVID-19 test.  EMS reports they found the patient with pulse oximetry of 72%.  They were called for shortness of breath.   CoViD 19 + CXR b/l infiltrates Severe Hypoxia  Poor Prognosis High risk for intubation  BP (!) 154/131   Pulse 61   Temp 98.7 F (37.1 C) (Axillary)   Resp (!) 24   Ht 6' (1.829 m)   Wt 90.7 kg   SpO2 99%   BMI 27.12 kg/m    Severe COVID-19 infection, ARDS and pneumonia/pneumonitis Continue IV steroids  IV remdisivir Aggressive pulm toilet recommended Pulmonary hygiene Continue proning as tolerated due to severe hypoxia   Maintain airborne and contact precautions  As needed bronchodilators (MDI) Vitamin C and zinc Antitussives High risk for intubation and death

## 2019-11-08 NOTE — ED Notes (Signed)
Pt given clean linen, position changed, diaper changed and cleansed with premoistened wipes.  Pt remains restless and thrashing despite constant guidance.  No other acute s/s of distress noted will continue to monitor

## 2019-11-08 NOTE — ED Provider Notes (Signed)
[facility] Emergency Department Provider Note   ____________________________________________    I have reviewed the triage vital signs and the nursing notes.   HISTORY  Chief Complaint Shortness of Breath  Patient is unable to provide history   HPI Kyle Richardson is a 86 y.o. male brought in for shortness of breath.  Reported recent positive COVID-19 test.  EMS reports they found the patient with pulse oximetry of 72%.  They were called for shortness of breath.  Patient came from facility apparently last seen normal at 3:30 in the morning  Past Medical History:  Diagnosis Date  . Abdominal fibromatosis   . 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  s/p PTCA of the mid RCA 1/96, also  s/p  CABG x 4 7/96  . Complication of anesthesia    confusion and hallucinations for several days after receiving Versed  . Degenerative disc disease   . Dementia ([place])   . Diabetes mellitus ([place])   . Glaucoma   . Hypercholesterolemia   . Hypertension   . Migraine headache    history of  . Myocardial infarction ([place]) 1996  . Osteoarthritis   . Sleep apnea   . Stroke [facility]) 02/22/2017   affected left side     Patient Active Problem List   Diagnosis Date Noted  . Scrotal edema 08/16/2019  . Pneumonia 07/28/2019  . Sepsis ([place]) 04/02/2019  . Acute encephalopathy 03/28/2019  . Diarrhea 03/09/2019  . Agitation 02/02/2019  . Stroke (cerebrum) ([place]) 10/11/2018  . Ureteral stone 08/25/2018  . Hydroureteronephrosis 08/05/2018  . Rotator cuff tendinitis, right 07/22/2018  . CVA, old, dysarthria 06/04/2018  . Hemiparesis affecting dominant side as late effect of cerebrovascular accident ([place]) 05/27/2018  . Encephalopathy   . Carotid stenosis, symptomatic, with infarction ([place]) 05/22/2018  . TIA (transient ischemic attack) 04/15/2018  . Episode of confusion 04/08/2018  . Labile blood pressure   . Acute ischemic left PCA stroke  ([place]) 02/27/2018  . Diabetes mellitus type 2 in nonobese (HCC)   . Glaucoma   . MG, ocular (myasthenia gravis) ([place])   . History of CVA (cerebrovascular accident) 02/24/2018  . Right homonymous hemianopsia 02/23/2018  . Unresponsive episode 02/22/2018  . UTI (urinary tract infection) 01/28/2018  . Abnormal chest CT 09/20/2017  . Double vision 01/21/2017  . Ascending aortic aneurysm ([place]) 12/22/2016  . Blood-tinged sputum 10/23/2016  . Cough 10/23/2016  . PAF (paroxysmal atrial fibrillation) ([place]) 09/14/2016  . Health care maintenance 12/26/2014  . Stress 07/05/2014  . Apnea, sleep 06/18/2014  . Migraine 05/10/2013  . Calculus of kidney 01/01/2013  . Hypertension 08/20/2012  . Hypercholesterolemia 08/20/2012  . CAD (coronary artery disease) 08/20/2012  . Benign localized hyperplasia of prostate without urinary obstruction and other lower urinary tract symptoms (LUTS) 02/28/2012    Past Surgical History:  Procedure Laterality Date  . APPENDECTOMY    . CAROTID ANGIOGRAPHY Bilateral 04/19/2018   Procedure: CAROTID ANGIOGRAPHY;  Surgeon: Algernon Huxley, MD;  Location: [place] CV LAB;  Service: Cardiovascular;  Laterality: Bilateral;  . CAROTID PTA/STENT INTERVENTION Right 05/22/2018   Procedure: CAROTID PTA/STENT INTERVENTION;  Surgeon: Katha Cabal, MD;  Location: [place] CV LAB;  Service: Cardiovascular;  Laterality: Right;  . CATARACT EXTRACTION W/ INTRAOCULAR LENS  IMPLANT, BILATERAL    . CHOLECYSTECTOMY     Removed  . CORONARY ARTERY BYPASS GRAFT  7/96   x4  . HEMORRHOID SURGERY    .  HERNIA REPAIR    . TONSILLECTOMY    . UPP and tonsillectomy  1/86    Prior to Admission medications   Medication Sig Start Date End Date Taking? Authorizing Provider  ALPRAZolam Duanne Moron) 0.5 MG tablet Take 0.5 mg by mouth 3 (three) times daily.    Yes [provider]  apixaban (ELIQUIS) 5 MG TABS tablet Take 1 tablet (5 mg total) by mouth 2 (two) times daily. 04/01/18   Yes Angiulli, Lavon Paganini, PA-C  aspirin EC 81 MG EC tablet Take 1 tablet (81 mg total) by mouth daily. 06/01/18  Yes Schnier, Dolores Lory, MD  atorvastatin (LIPITOR) 40 MG tablet TAKE ONE TABLET BY MOUTH AT BEDTIME 05/30/19  Yes Einar Pheasant, MD  divalproex (DEPAKOTE SPRINKLE) 125 MG capsule Take 125-250 mg by mouth 2 (two) times daily. One capsule at 1300-1400 and one and one-half (187.5) capsules at bedtime 06/18/19  Yes [provider]  fluticasone (FLONASE) 50 MCG/ACT nasal spray Place 2 sprays into both nostrils daily. 12/13/18  Yes Guse, Jacquelynn Cree, FNP  latanoprost (XALATAN) 0.005 % ophthalmic solution Place 1 drop into both eyes at bedtime.   Yes [provider]  loratadine (CLARITIN) 10 MG tablet Take 10 mg by mouth daily.   Yes [provider]  Melatonin 10 MG TABS Take 10 mg by mouth at bedtime.   Yes [provider]  metoprolol succinate (TOPROL-XL) 25 MG 24 hr tablet TAKE 1 TABLET BY MOUTH DAILY Patient taking differently: Take 12.5 mg by mouth daily.  10/30/19  Yes Einar Pheasant, MD  nitroGLYCERIN (NITROSTAT) 0.4 MG SL tablet Place 0.4 mg under the tongue every 5 (five) minutes as needed for chest pain.    Yes [provider]  tiZANidine (ZANAFLEX) 4 MG tablet Take 1 tablet (4 mg total) by mouth daily as needed for muscle spasms. 10/14/19  Yes Einar Pheasant, MD  traZODone (DESYREL) 50 MG tablet Take 50 mg by mouth at bedtime.    Yes [provider]     Allergies Latex, Fentanyl, Flomax [tamsulosin hcl], Lamisil [terbinafine], Levofloxacin, Lorazepam, Metformin and related, Sertraline, Sulfa antibiotics, Versed [midazolam], Clarithromycin, and Codeine  Family History  Problem Relation Age of Onset  . Heart disease Mother        died age 32  . Heart disease Father        and other vascular disease  . Diabetes Other        four siblings  . Heart disease Brother        s/p CABG  . Colon cancer Neg Hx   . Prostate cancer Neg Hx      Social History Social History   Tobacco Use  . Smoking status: Never Smoker  . Smokeless tobacco: Never Used  Substance Use Topics  . Alcohol use: No    Alcohol/week: 0.0 standard drinks  . Drug use: No    Unable to obtain review of Systems     ____________________________________________   PHYSICAL EXAM:  VITAL SIGNS: ED Triage Vitals  Enc Vitals Group     BP 11/08/19 1348 (!) 151/80     Pulse Rate 11/08/19 1348 66     Resp 11/08/19 1348 (!) 22     Temp 11/08/19 1355 98.7 F (37.1 C)     Temp Source 11/08/19 1355 Axillary     SpO2 11/08/19 1346 97 %     Weight 11/08/19 1349 90.7 kg (200 lb)     Height 11/08/19 1349 1.829 m (6')  Head Circumference --      Peak Flow --      Pain Score 11/08/19 1349 0     Pain Loc --      Pain Edu? --      Excl. in Power? --     Constitutional: Opens eyes to painful stimuli, respiratory distress  Head: Atraumatic. Nose: No congestion/rhinnorhea. Mouth/Throat: Mucous membranes are moist.   Neck:  Painless ROM Cardiovascular: Tachycardia regular rhythm. Grossly normal heart sounds.  Good peripheral circulation. Respiratory: Increased respiratory effort with tachypnea, diffuse rales Gastrointestinal: Soft and nontender. No distention.    Musculoskeletal: No lower extremity tenderness nor edema.  Warm and well perfused Neurologic: Appears to move all extremities Skin:  Skin is warm, dry and intact. No rash noted. Psychiatric: Unable to examine ____________________________________________   LABS (all labs ordered are listed, but only abnormal results are displayed)  Labs Reviewed  FIBRIN DERIVATIVES D-DIMER ([place]) - Abnormal; Notable for the following components:      Result Value   Fibrin derivatives D-dimer ([facility]) 1,020.07 (*)    All other components within normal limits  FIBRINOGEN - Abnormal; Notable for the following components:   Fibrinogen >750 (*)    All other components within normal limits  CULTURE,  BLOOD (ROUTINE X 2)  CULTURE, BLOOD (ROUTINE X 2)  CBC WITH DIFFERENTIAL/PLATELET  TRIGLYCERIDES  LACTIC ACID, PLASMA  LACTIC ACID, PLASMA  COMPREHENSIVE METABOLIC PANEL  PROCALCITONIN  LACTATE DEHYDROGENASE  FERRITIN  C-REACTIVE PROTEIN  POC SARS CORONAVIRUS 2 AG -  ED  TROPONIN I (HIGH SENSITIVITY)   ____________________________________________  EKG  ED ECG REPORT I, Lavonia Drafts, the attending physician, personally viewed and interpreted this ECG.  Date: 11/08/2019  Rhythm: normal sinus rhythm QRS Axis: normal Intervals: normal ST/T Wave abnormalities: normal Narrative Interpretation: no evidence of acute ischemia  ____________________________________________  RADIOLOGY  Chest x-ray consistent with multifocal pneumonia ____________________________________________   PROCEDURES  Procedure(s) performed: No  Procedures   Critical Care performed: yes  CRITICAL CARE Performed by: Lavonia Drafts   Total critical care time: 30 minutes  Critical care time was exclusive of separately billable procedures and treating other patients.  Critical care was necessary to treat or prevent imminent or life-threatening deterioration.  Critical care was time spent personally by me on the following activities: development of treatment plan with patient and/or surrogate as well as nursing, discussions with consultants, evaluation of patient's response to treatment, examination of patient, obtaining history from patient or surrogate, ordering and performing treatments and interventions, ordering and review of laboratory studies, ordering and review of radiographic studies, pulse oximetry and re-evaluation of patient's condition.  ____________________________________________   INITIAL IMPRESSION / ASSESSMENT AND PLAN / ED COURSE  Pertinent labs & imaging results that were available during my care of the patient were reviewed by me and considered in my medical decision making  (see chart for details).  Patient with reported diagnosis of COVID-19 presents with respiratory failure likely secondary to COVID-19 pneumonia.  Placed on 15 L nonrebreather with significant improvement in pulse oximetry.  Pending labs, chest x-ray.  Seems to becoming slightly more responsive with oxygen.  Will consider high flow nasal cannula  ----------------------------------------- 2:43 PM on 11/08/2019 -----------------------------------------  Patient is more responsive now, will transition him to high flow nasal cannula, chest x-ray consistent with COVID-19 pneumonia.  Although EMS reports that the patient was recently diagnosed with COVID-19, did not have access to a positive test result at this time, have sent for a rapid  nasal swab.  The patient will certainly require admission, has received IV Decadron    ____________________________________________   FINAL CLINICAL IMPRESSION(S) / ED DIAGNOSES  Final diagnoses:  Acute hypoxemic respiratory failure due to COVID-19 [facility])        Note:  This document was prepared using Dragon voice recognition software and may include unintentional dictation errors.   Lavonia Drafts, MD 11/08/19 1444

## 2019-11-08 NOTE — Consult Note (Signed)
Remdesivir - Pharmacy Brief Note   O:  ALT: 18 CXR: "Multifocal bilateral interstitial opacities, suspicious for COVID-19 pneumonia" SpO2: Hypoxic requiring 15L non-rebreather   A/P:  1/30 POC SARS-CoV-2 antigen positive  Remdesivir 200 mg IVPB once followed by 100 mg IVPB daily x 4 days.   [place] Resident 11/08/2019 4:12 PM

## 2019-11-08 NOTE — ED Notes (Signed)
Respiratory at bedside. Pt much more awake with GCS 14 at this time. Pt talking and trying to get out of bed. Pt grabs this RN arm as this RN tries to reposition pt and pt begins raising his voice. Agricultural consultant notified. Mickel Baas, NT to sit at bedside.

## 2019-11-09 ENCOUNTER — Inpatient Hospital Stay (HOSPITAL_COMMUNITY)
Admission: AD | Admit: 2019-11-09 | Discharge: 2019-12-08 | DRG: 177 | Disposition: E | Payer: PPO | Source: Other Acute Inpatient Hospital | Attending: Family Medicine | Admitting: Family Medicine

## 2019-11-09 ENCOUNTER — Encounter (HOSPITAL_COMMUNITY): Payer: Self-pay | Admitting: Family Medicine

## 2019-11-09 ENCOUNTER — Inpatient Hospital Stay: Payer: PPO

## 2019-11-09 ENCOUNTER — Other Ambulatory Visit: Payer: Self-pay

## 2019-11-09 DIAGNOSIS — L8990 Pressure ulcer of unspecified site, unspecified stage: Secondary | ICD-10-CM | POA: Diagnosis present

## 2019-11-09 DIAGNOSIS — E119 Type 2 diabetes mellitus without complications: Secondary | ICD-10-CM

## 2019-11-09 DIAGNOSIS — Z515 Encounter for palliative care: Secondary | ICD-10-CM | POA: Diagnosis not present

## 2019-11-09 DIAGNOSIS — E7800 Pure hypercholesterolemia, unspecified: Secondary | ICD-10-CM | POA: Diagnosis present

## 2019-11-09 DIAGNOSIS — F0390 Unspecified dementia without behavioral disturbance: Secondary | ICD-10-CM | POA: Diagnosis present

## 2019-11-09 DIAGNOSIS — I2119 ST elevation (STEMI) myocardial infarction involving other coronary artery of inferior wall: Secondary | ICD-10-CM | POA: Diagnosis not present

## 2019-11-09 DIAGNOSIS — Z7982 Long term (current) use of aspirin: Secondary | ICD-10-CM

## 2019-11-09 DIAGNOSIS — Z885 Allergy status to narcotic agent status: Secondary | ICD-10-CM

## 2019-11-09 DIAGNOSIS — Z79891 Long term (current) use of opiate analgesic: Secondary | ICD-10-CM

## 2019-11-09 DIAGNOSIS — E785 Hyperlipidemia, unspecified: Secondary | ICD-10-CM | POA: Diagnosis not present

## 2019-11-09 DIAGNOSIS — H409 Unspecified glaucoma: Secondary | ICD-10-CM | POA: Diagnosis present

## 2019-11-09 DIAGNOSIS — E876 Hypokalemia: Secondary | ICD-10-CM | POA: Diagnosis not present

## 2019-11-09 DIAGNOSIS — Z833 Family history of diabetes mellitus: Secondary | ICD-10-CM

## 2019-11-09 DIAGNOSIS — Z7189 Other specified counseling: Secondary | ICD-10-CM

## 2019-11-09 DIAGNOSIS — L89151 Pressure ulcer of sacral region, stage 1: Secondary | ICD-10-CM | POA: Diagnosis not present

## 2019-11-09 DIAGNOSIS — I252 Old myocardial infarction: Secondary | ICD-10-CM | POA: Diagnosis not present

## 2019-11-09 DIAGNOSIS — Z91040 Latex allergy status: Secondary | ICD-10-CM

## 2019-11-09 DIAGNOSIS — Z8249 Family history of ischemic heart disease and other diseases of the circulatory system: Secondary | ICD-10-CM

## 2019-11-09 DIAGNOSIS — I10 Essential (primary) hypertension: Secondary | ICD-10-CM | POA: Diagnosis not present

## 2019-11-09 DIAGNOSIS — Z66 Do not resuscitate: Secondary | ICD-10-CM | POA: Diagnosis present

## 2019-11-09 DIAGNOSIS — J9601 Acute respiratory failure with hypoxia: Secondary | ICD-10-CM | POA: Diagnosis present

## 2019-11-09 DIAGNOSIS — G9340 Encephalopathy, unspecified: Secondary | ICD-10-CM | POA: Diagnosis not present

## 2019-11-09 DIAGNOSIS — R197 Diarrhea, unspecified: Secondary | ICD-10-CM | POA: Diagnosis present

## 2019-11-09 DIAGNOSIS — Z951 Presence of aortocoronary bypass graft: Secondary | ICD-10-CM

## 2019-11-09 DIAGNOSIS — R7989 Other specified abnormal findings of blood chemistry: Secondary | ICD-10-CM | POA: Diagnosis not present

## 2019-11-09 DIAGNOSIS — Z8673 Personal history of transient ischemic attack (TIA), and cerebral infarction without residual deficits: Secondary | ICD-10-CM | POA: Diagnosis not present

## 2019-11-09 DIAGNOSIS — Z7901 Long term (current) use of anticoagulants: Secondary | ICD-10-CM | POA: Diagnosis not present

## 2019-11-09 DIAGNOSIS — Z95828 Presence of other vascular implants and grafts: Secondary | ICD-10-CM

## 2019-11-09 DIAGNOSIS — Z882 Allergy status to sulfonamides status: Secondary | ICD-10-CM

## 2019-11-09 DIAGNOSIS — R778 Other specified abnormalities of plasma proteins: Secondary | ICD-10-CM | POA: Diagnosis present

## 2019-11-09 DIAGNOSIS — U071 COVID-19: Secondary | ICD-10-CM | POA: Diagnosis not present

## 2019-11-09 DIAGNOSIS — I2510 Atherosclerotic heart disease of native coronary artery without angina pectoris: Secondary | ICD-10-CM | POA: Diagnosis present

## 2019-11-09 DIAGNOSIS — I213 ST elevation (STEMI) myocardial infarction of unspecified site: Secondary | ICD-10-CM | POA: Diagnosis not present

## 2019-11-09 DIAGNOSIS — I480 Paroxysmal atrial fibrillation: Secondary | ICD-10-CM | POA: Diagnosis present

## 2019-11-09 DIAGNOSIS — G4730 Sleep apnea, unspecified: Secondary | ICD-10-CM | POA: Diagnosis present

## 2019-11-09 DIAGNOSIS — E870 Hyperosmolality and hypernatremia: Secondary | ICD-10-CM

## 2019-11-09 DIAGNOSIS — G9341 Metabolic encephalopathy: Secondary | ICD-10-CM | POA: Diagnosis present

## 2019-11-09 DIAGNOSIS — E1165 Type 2 diabetes mellitus with hyperglycemia: Secondary | ICD-10-CM | POA: Diagnosis not present

## 2019-11-09 DIAGNOSIS — Z888 Allergy status to other drugs, medicaments and biological substances status: Secondary | ICD-10-CM

## 2019-11-09 DIAGNOSIS — N179 Acute kidney failure, unspecified: Secondary | ICD-10-CM | POA: Diagnosis present

## 2019-11-09 DIAGNOSIS — Z79899 Other long term (current) drug therapy: Secondary | ICD-10-CM

## 2019-11-09 DIAGNOSIS — J1282 Pneumonia due to coronavirus disease 2019: Secondary | ICD-10-CM | POA: Diagnosis not present

## 2019-11-09 DIAGNOSIS — Z9049 Acquired absence of other specified parts of digestive tract: Secondary | ICD-10-CM

## 2019-11-09 DIAGNOSIS — Z881 Allergy status to other antibiotic agents status: Secondary | ICD-10-CM

## 2019-11-09 HISTORY — DX: Unspecified atrial fibrillation: I48.91

## 2019-11-09 LAB — CBC WITH DIFFERENTIAL/PLATELET
Abs Immature Granulocytes: 0.06 10*3/uL (ref 0.00–0.07)
Basophils Absolute: 0 10*3/uL (ref 0.0–0.1)
Basophils Relative: 0 %
Eosinophils Absolute: 0 10*3/uL (ref 0.0–0.5)
Eosinophils Relative: 0 %
HCT: 42.5 % (ref 39.0–52.0)
Hemoglobin: 14.1 g/dL (ref 13.0–17.0)
Immature Granulocytes: 1 %
Lymphocytes Relative: 7 %
Lymphs Abs: 0.6 10*3/uL — ABNORMAL LOW (ref 0.7–4.0)
MCH: 32.1 pg (ref 26.0–34.0)
MCHC: 33.2 g/dL (ref 30.0–36.0)
MCV: 96.8 fL (ref 80.0–100.0)
Monocytes Absolute: 0.2 10*3/uL (ref 0.1–1.0)
Monocytes Relative: 2 %
Neutro Abs: 8.1 10*3/uL — ABNORMAL HIGH (ref 1.7–7.7)
Neutrophils Relative %: 90 %
Platelets: 190 10*3/uL (ref 150–400)
RBC: 4.39 MIL/uL (ref 4.22–5.81)
RDW: 12.5 % (ref 11.5–15.5)
WBC: 9.1 10*3/uL (ref 4.0–10.5)
nRBC: 0 % (ref 0.0–0.2)

## 2019-11-09 LAB — URINALYSIS, COMPLETE (UACMP) WITH MICROSCOPIC
Bilirubin Urine: NEGATIVE
Glucose, UA: >=500 mg/dL — AB
Hgb urine dipstick: NEGATIVE
Ketones, ur: 20 mg/dL — AB
Leukocytes,Ua: NEGATIVE
Nitrite: NEGATIVE
Protein, ur: 100 mg/dL — AB
Specific Gravity, Urine: 1.035 — ABNORMAL HIGH (ref 1.005–1.030)
Squamous Epithelial / HPF: NONE SEEN (ref 0–5)
pH: 5 (ref 5.0–8.0)

## 2019-11-09 LAB — COMPREHENSIVE METABOLIC PANEL
ALT: 20 U/L (ref 0–44)
AST: 37 U/L (ref 15–41)
Albumin: 2.6 g/dL — ABNORMAL LOW (ref 3.5–5.0)
Alkaline Phosphatase: 60 U/L (ref 38–126)
Anion gap: 14 (ref 5–15)
BUN: 30 mg/dL — ABNORMAL HIGH (ref 8–23)
CO2: 20 mmol/L — ABNORMAL LOW (ref 22–32)
Calcium: 8.4 mg/dL — ABNORMAL LOW (ref 8.9–10.3)
Chloride: 109 mmol/L (ref 98–111)
Creatinine, Ser: 0.91 mg/dL (ref 0.61–1.24)
GFR calc Af Amer: >60 mL/min (ref >60)
GFR calc non Af Amer: >60 mL/min (ref >60)
Glucose, Bld: 179 mg/dL — ABNORMAL HIGH (ref 70–99)
Potassium: 4 mmol/L (ref 3.5–5.1)
Sodium: 143 mmol/L (ref 135–145)
Total Bilirubin: 0.8 mg/dL (ref 0.3–1.2)
Total Protein: 6.3 g/dL — ABNORMAL LOW (ref 6.5–8.1)

## 2019-11-09 LAB — BLOOD GAS, ARTERIAL
Acid-base deficit: 0.7 mmol/L (ref 0.0–2.0)
Bicarbonate: 23.4 mmol/L (ref 20.0–28.0)
FIO2: 100
O2 Saturation: 99.3 %
Patient temperature: 37
pCO2 arterial: 36 mmHg (ref 32.0–48.0)
pH, Arterial: 7.42 (ref 7.350–7.450)
pO2, Arterial: 144 mmHg — ABNORMAL HIGH (ref 83.0–108.0)

## 2019-11-09 LAB — MAGNESIUM: Magnesium: 1.9 mg/dL (ref 1.7–2.4)

## 2019-11-09 LAB — HEMOGLOBIN A1C
Hgb A1c MFr Bld: 8.7 % — ABNORMAL HIGH (ref 4.8–5.6)
Mean Plasma Glucose: 202.99 mg/dL

## 2019-11-09 LAB — D-DIMER, QUANTITATIVE: D-Dimer, Quant: 1.28 ug/mL-FEU — ABNORMAL HIGH (ref 0.00–0.50)

## 2019-11-09 LAB — C-REACTIVE PROTEIN: CRP: 30.5 mg/dL — ABNORMAL HIGH (ref <1.0)

## 2019-11-09 LAB — HEPATITIS B SURFACE ANTIGEN: Hepatitis B Surface Ag: NONREACTIVE

## 2019-11-09 LAB — ABO/RH: ABO/RH(D): O POS

## 2019-11-09 LAB — GLUCOSE, CAPILLARY
Glucose-Capillary: 213 mg/dL — ABNORMAL HIGH (ref 70–99)
Glucose-Capillary: 191 mg/dL — ABNORMAL HIGH (ref 70–99)
Glucose-Capillary: 178 mg/dL — ABNORMAL HIGH (ref 70–99)
Glucose-Capillary: 203 mg/dL — ABNORMAL HIGH (ref 70–99)
Glucose-Capillary: 227 mg/dL — ABNORMAL HIGH (ref 70–99)

## 2019-11-09 LAB — FIBRIN DERIVATIVES D-DIMER (ARMC ONLY): Fibrin derivatives D-dimer (ARMC): 598.67 ng/mL (FEU) — ABNORMAL HIGH (ref 0.00–499.00)

## 2019-11-09 LAB — TROPONIN I (HIGH SENSITIVITY): Troponin I (High Sensitivity): 27 ng/L — ABNORMAL HIGH (ref <18)

## 2019-11-09 LAB — PROCALCITONIN: Procalcitonin: 0.17 ng/mL

## 2019-11-09 LAB — FERRITIN: Ferritin: 442 ng/mL — ABNORMAL HIGH (ref 24–336)

## 2019-11-09 MED ORDER — SODIUM CHLORIDE 0.9 % IV SOLN
100.0000 mg | Freq: Every day | INTRAVENOUS | Status: AC
  Administered 2019-11-09 – 2019-11-12 (×4): 100 mg via INTRAVENOUS
  Filled 2019-11-09 (×2): qty 20

## 2019-11-09 MED ORDER — METOPROLOL SUCCINATE ER 25 MG PO TB24
12.5000 mg | ORAL_TABLET | Freq: Every day | ORAL | Status: DC
  Administered 2019-11-09 – 2019-11-12 (×4): 12.5 mg via ORAL
  Filled 2019-11-09 (×4): qty 1

## 2019-11-09 MED ORDER — ASCORBIC ACID 500 MG PO TABS
500.0000 mg | ORAL_TABLET | Freq: Every day | ORAL | Status: DC
  Administered 2019-11-09 – 2019-11-12 (×4): 500 mg via ORAL
  Filled 2019-11-09 (×4): qty 1

## 2019-11-09 MED ORDER — LATANOPROST 0.005 % OP SOLN
1.0000 [drp] | Freq: Every day | OPHTHALMIC | Status: DC
  Administered 2019-11-10 – 2019-11-12 (×4): 1 [drp] via OPHTHALMIC
  Filled 2019-11-09: qty 2.5

## 2019-11-09 MED ORDER — SODIUM CHLORIDE 0.9% IV SOLUTION: Freq: Once | INTRAVENOUS | Status: AC

## 2019-11-09 MED ORDER — VALPROIC ACID 250 MG/5ML PO SOLN
125.0000 mg | Freq: Once | ORAL | Status: AC
  Administered 2019-11-09: 125 mg via ORAL
  Filled 2019-11-09: qty 2.5

## 2019-11-09 MED ORDER — ALBUTEROL SULFATE HFA 108 (90 BASE) MCG/ACT IN AERS
2.0000 | INHALATION_SPRAY | RESPIRATORY_TRACT | Status: DC | PRN
  Administered 2019-11-09 – 2019-11-12 (×4): 2 via RESPIRATORY_TRACT
  Filled 2019-11-09: qty 6.7

## 2019-11-09 MED ORDER — MELATONIN 5 MG PO TABS
10.0000 mg | ORAL_TABLET | Freq: Every day | ORAL | Status: DC
  Administered 2019-11-10 – 2019-11-11 (×2): 10 mg via ORAL
  Filled 2019-11-09 (×5): qty 2

## 2019-11-09 MED ORDER — APIXABAN 5 MG PO TABS
5.0000 mg | ORAL_TABLET | Freq: Two times a day (BID) | ORAL | Status: DC
  Administered 2019-11-09 – 2019-11-12 (×7): 5 mg via ORAL
  Filled 2019-11-09 (×8): qty 1

## 2019-11-09 MED ORDER — ATORVASTATIN CALCIUM 40 MG PO TABS
40.0000 mg | ORAL_TABLET | Freq: Every day | ORAL | Status: DC
  Administered 2019-11-09 – 2019-11-11 (×3): 40 mg via ORAL
  Filled 2019-11-09 (×4): qty 1

## 2019-11-09 MED ORDER — INSULIN ASPART 100 UNIT/ML ~~LOC~~ SOLN
0.0000 [IU] | SUBCUTANEOUS | Status: DC
  Administered 2019-11-09: 3 [IU] via SUBCUTANEOUS
  Administered 2019-11-10: 04:00:00 2 [IU] via SUBCUTANEOUS
  Administered 2019-11-10: 16:00:00 3 [IU] via SUBCUTANEOUS
  Administered 2019-11-10: 12:00:00 2 [IU] via SUBCUTANEOUS
  Administered 2019-11-10: 23:00:00 3 [IU] via SUBCUTANEOUS
  Administered 2019-11-10 (×3): 2 [IU] via SUBCUTANEOUS
  Administered 2019-11-11: 04:00:00 3 [IU] via SUBCUTANEOUS

## 2019-11-09 MED ORDER — ALPRAZOLAM 0.5 MG PO TABS
0.2500 mg | ORAL_TABLET | Freq: Three times a day (TID) | ORAL | Status: DC | PRN
  Administered 2019-11-09: 0.25 mg via ORAL
  Filled 2019-11-09: qty 1

## 2019-11-09 MED ORDER — IPRATROPIUM-ALBUTEROL 20-100 MCG/ACT IN AERS
1.0000 | INHALATION_SPRAY | Freq: Four times a day (QID) | RESPIRATORY_TRACT | Status: DC
  Administered 2019-11-09 – 2019-11-13 (×13): 1 via RESPIRATORY_TRACT
  Filled 2019-11-09: qty 4

## 2019-11-09 MED ORDER — MELATONIN 10 MG PO TABS: 10.0000 mg | ORAL_TABLET | Freq: Every day | ORAL | Status: DC

## 2019-11-09 MED ORDER — TRAZODONE HCL 50 MG PO TABS
50.0000 mg | ORAL_TABLET | Freq: Every day | ORAL | Status: DC
  Administered 2019-11-09 – 2019-11-11 (×3): 50 mg via ORAL
  Filled 2019-11-09 (×4): qty 1

## 2019-11-09 MED ORDER — DIVALPROEX SODIUM 125 MG PO CSDR
125.0000 mg | DELAYED_RELEASE_CAPSULE | Freq: Once | ORAL | Status: AC
  Administered 2019-11-10: 125 mg via ORAL
  Filled 2019-11-09: qty 1

## 2019-11-09 MED ORDER — VALPROIC ACID 250 MG/5ML PO SOLN
125.0000 mg | ORAL | Status: DC
  Administered 2019-11-10 – 2019-11-12 (×3): 125 mg via ORAL
  Filled 2019-11-09 (×4): qty 2.5

## 2019-11-09 MED ORDER — VALPROIC ACID 250 MG/5ML PO SOLN
187.5000 mg | Freq: Every day | ORAL | Status: DC
  Administered 2019-11-10 – 2019-11-12 (×3): 187.5 mg via ORAL
  Filled 2019-11-09 (×5): qty 3.8

## 2019-11-09 MED ORDER — TOCILIZUMAB 400 MG/20ML IV SOLN
800.0000 mg | Freq: Once | INTRAVENOUS | Status: AC
  Administered 2019-11-09: 800 mg via INTRAVENOUS
  Filled 2019-11-09: qty 40

## 2019-11-09 MED ORDER — ZINC SULFATE 220 (50 ZN) MG PO CAPS
220.0000 mg | ORAL_CAPSULE | Freq: Every day | ORAL | Status: DC
  Administered 2019-11-09 – 2019-11-12 (×4): 220 mg via ORAL
  Filled 2019-11-09 (×4): qty 1

## 2019-11-09 MED ORDER — METHYLPREDNISOLONE SODIUM SUCC 125 MG IJ SOLR
45.0000 mg | Freq: Two times a day (BID) | INTRAMUSCULAR | Status: DC
  Administered 2019-11-09 – 2019-11-13 (×9): 45 mg via INTRAVENOUS
  Filled 2019-11-09 (×9): qty 2

## 2019-11-09 NOTE — ED Notes (Signed)
[facility] otw to transport per [employer]. Pt currently on NRB with 100% saturation. Pt not following verbal commands.

## 2019-11-09 NOTE — Progress Notes (Signed)
Pt arrived via [facility] with 15L NRB mask on. Pt transitioned to Heated High flow nasal cannula 40L/ 100%. RT will continue to monitor/wean as needed.

## 2019-11-09 NOTE — ED Notes (Signed)
Pt taken to CT with sitter escorting

## 2019-11-09 NOTE — Progress Notes (Signed)
Requested by bedside RN to have patient evaluated for transfer to [facility].  She also reports patient demonstrated significant signs of aspiration with swallowing of medications. Patient made npo, changing all meds to IV.  Bedside eval patient with VSS. Respirations non labored and oxygen saturations stable but requiring. Patient responds to voice with noncoherent slurred speech pattern.  He does not open eyes and focus with name called.   Spontaneous movement of extremities, no commands followed.    Plan to trial NRB supplemental oxygen to determine stability for transfer/. Will obtain ABG after one hour.  Questionable acute delirium on baseline dementia on presentation. Procal mild elevation, CRP elevated, white count normal. No urinalysis yet done and specimen for C diff not collected. Urinalysis and c diff pcr ordered. - urinalysis with rare bacteria, minimal WBC (0-5) no nitrates.  Likely not acute infection.  Discussed transfer to [facility] with [facility] who accepted transfer request and will call back when bed assigned

## 2019-11-09 NOTE — H&P (Addendum)
History and Physical    Kyle Richardson [id number] DOB: [birth year] DOA: 10/19/2019  PCP: Einar Pheasant, MD  Patient coming from: home? (per [facility] say SNF, but daughter was caring for him per my discussion, believe he was home?)  I have personally briefly reviewed patient's old medical records in [place]  Chief Complaint: SOB  HPI: Kyle Richardson is Kyle Richardson 86 y.o. male with medical history significant of CAD, CVA, T2DM, HTN, atrial fibrillation on eliquis, glaucoma and multiple other medical problems who presented to [facility] in the setting of COVID 19 infection and shortness of breath.  History obtained from medical record and family.  Patient had positive COVID 19 test on Monday.  Wife notes one of the family caregivers tested positive Kyle Richardson few weeks ago and that the patient has been sick for several weeks.  He was found to have desaturation to 70% on RA and needed HF oxygen in the ED.  He had diarrhea, but no nausea, vomiting, or abdominal pain per HPI.  He's more confused and per discussion with family, often is confused at home, especially in the late afternoon.  He has dementia at baseline.  He hasn't been able to stand for several months.  Family uses hoyer lift to transfer him.    Review of Systems: unable to assess due to mental status  Past Medical History:  Diagnosis Date  . Abdominal fibromatosis   . 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  s/p PTCA of the mid RCA 1/96, also  s/p  CABG x 4 7/96  . Complication of anesthesia    confusion and hallucinations for several days after receiving Versed  . Degenerative disc disease   . Dementia ([place])   . Diabetes mellitus ([place])   . Glaucoma   . Hypercholesterolemia   . Hypertension   . Migraine headache    history of  . Myocardial infarction ([place]) 1996  . Osteoarthritis   . Sleep apnea   . Stroke [facility]) 02/22/2017   affected left side     Past Surgical History:  Procedure Laterality  Date  . APPENDECTOMY    . CAROTID ANGIOGRAPHY Bilateral 04/19/2018   Procedure: CAROTID ANGIOGRAPHY;  Surgeon: Algernon Huxley, MD;  Location: [facility];  Service: Cardiovascular;  Laterality: Bilateral;  . CAROTID PTA/STENT INTERVENTION Right 05/22/2018   Procedure: CAROTID PTA/STENT INTERVENTION;  Surgeon: Katha Cabal, MD;  Location: [facility];  Service: Cardiovascular;  Laterality: Right;  . CATARACT EXTRACTION W/ INTRAOCULAR LENS  IMPLANT, BILATERAL    . CHOLECYSTECTOMY     Removed  . CORONARY ARTERY BYPASS GRAFT  7/96   x4  . HEMORRHOID SURGERY    . HERNIA REPAIR    . TONSILLECTOMY    . UPP and tonsillectomy  1/86     reports that he has never smoked. He has never used smokeless tobacco. He reports that he does not drink alcohol or use drugs.  Allergies  Allergen Reactions  . Latex Swelling  . Fentanyl Other (See Comments)    Reaction: confusion  . Flomax [Tamsulosin Hcl] Other (See Comments)    Unknown  . Lamisil [Terbinafine] Other (See Comments)    Unknown  . Levofloxacin Other (See Comments)    Diplopia. NEVER put patient on levoquin!  . Lorazepam Other (See Comments)    Reaction: confusion/ hallucinations   . Metformin And Related Diarrhea  . Sertraline Other (See Comments)    Unknown  .  Sulfa Antibiotics Other (See Comments)    Unknown  . Versed [Midazolam] Other (See Comments)    Hallucinations and confusion  . Clarithromycin Diarrhea  . Codeine Other (See Comments)    Unknown    Family History  Problem Relation Age of Onset  . Heart disease Mother        died age 45  . Heart disease Father        and other vascular disease  . Diabetes Other        four siblings  . Heart disease Brother        s/p CABG  . Colon cancer Neg Hx   . Prostate cancer Neg Hx    Prior to Admission medications   Medication Sig Start Date End Date Taking? Authorizing Provider  ALPRAZolam Duanne Moron) 0.5 MG tablet Take 0.5 mg by mouth 3 (three) times  daily.     [provider]  apixaban (ELIQUIS) 5 MG TABS tablet Take 1 tablet (5 mg total) by mouth 2 (two) times daily. 04/01/18   Angiulli, Lavon Paganini, PA-C  aspirin EC 81 MG EC tablet Take 1 tablet (81 mg total) by mouth daily. 06/01/18   Schnier, Dolores Lory, MD  atorvastatin (LIPITOR) 40 MG tablet TAKE ONE TABLET BY MOUTH AT BEDTIME 05/30/19   Einar Pheasant, MD  divalproex (DEPAKOTE SPRINKLE) 125 MG capsule Take 125-250 mg by mouth 2 (two) times daily. One capsule at 1300-1400 and one and one-half (187.5) capsules at bedtime 06/18/19   [provider]  fluticasone (FLONASE) 50 MCG/ACT nasal spray Place 2 sprays into both nostrils daily. 12/13/18   Guse, Jacquelynn Cree, FNP  latanoprost (XALATAN) 0.005 % ophthalmic solution Place 1 drop into both eyes at bedtime.    [provider]  loratadine (CLARITIN) 10 MG tablet Take 10 mg by mouth daily.    [provider]  Melatonin 10 MG TABS Take 10 mg by mouth at bedtime.    [provider]  metoprolol succinate (TOPROL-XL) 25 MG 24 hr tablet TAKE 1 TABLET BY MOUTH DAILY Patient taking differently: Take 12.5 mg by mouth daily.  10/30/19   Einar Pheasant, MD  nitroGLYCERIN (NITROSTAT) 0.4 MG SL tablet Place 0.4 mg under the tongue every 5 (five) minutes as needed for chest pain.     [provider]  tiZANidine (ZANAFLEX) 4 MG tablet Take 1 tablet (4 mg total) by mouth daily as needed for muscle spasms. 10/14/19   Einar Pheasant, MD  traZODone (DESYREL) 50 MG tablet Take 50 mg by mouth at bedtime.     [provider]    Physical Exam: Vitals:   10/18/2019 0956 10/17/2019 1005 10/30/2019 1006 10/18/2019 1200  BP:      Pulse: 79   91  Resp: 20   16  SpO2: 91% (S) (!) 83% 90% 92%    Constitutional: NAD, calm, comfortable, confused Vitals:   11/01/2019 0956 10/10/2019 1005 10/18/2019 1006 11/02/2019 1200  BP:      Pulse: 79   91  Resp: 20   16  SpO2: 91% (S) (!) 83% 90% 92%   Eyes: PERRL, lids and conjunctivae  normal Neck: normal, supple, no masses, no thyromegaly Respiratory: requiring HHFNC, work of breathing appears comfortable Cardiovascular: Regular rate and rhythm, no murmurs / rubs / gallops. No extremity edema.  Abdomen: no tenderness, no masses palpated. No hepatosplenomegaly. Bowel sounds positive.  Musculoskeletal: no clubbing / cyanosis. No joint deformity upper and lower extremities. Good ROM, no contractures. Normal  muscle tone.  Skin: no rashes, lesions, ulcers. No induration Neurologic: CN 2-12 grossly intact. Moving all extremities. Psychiatric: impaired. Confused. Normal mood.   Labs on Admission: I have personally reviewed following labs and imaging studies  CBC: Recent Labs  Lab 11/08/19 1353 10/20/2019 0447  WBC 7.4 9.1  NEUTROABS 6.0 8.1*  HGB 15.8 14.1  HCT 48.8 42.5  MCV 97.2 96.8  PLT 181 [id number]   Basic Metabolic Panel: Recent Labs  Lab 11/08/19 1353 10/26/2019 0447  NA 143 143  K 4.2 4.0  CL 107 109  CO2 25 20*  GLUCOSE 151* 179*  BUN 30* 30*  CREATININE 1.37* 0.91  CALCIUM 8.7* 8.4*  MG  --  1.9   GFR: Estimated Creatinine Clearance: 60.4 mL/min (by C-G formula based on SCr of 0.91 mg/dL). Liver Function Tests: Recent Labs  Lab 11/08/19 1353 10/10/2019 0447  AST 32 37  ALT 18 20  ALKPHOS 63 60  BILITOT 0.8 0.8  PROT 7.3 6.3*  ALBUMIN 3.1* 2.6*   No results for input(s): LIPASE, AMYLASE in the last 168 hours. No results for input(s): AMMONIA in the last 168 hours. Coagulation Profile: No results for input(s): INR, PROTIME in the last 168 hours. Cardiac Enzymes: No results for input(s): CKTOTAL, CKMB, CKMBINDEX, TROPONINI in the last 168 hours. BNP (last 3 results) No results for input(s): PROBNP in the last 8760 hours. HbA1C: Recent Labs    11/08/19 2238  HGBA1C 8.7*   CBG: Recent Labs  Lab 11/02/2019 0110 11/05/2019 0908 11/08/2019 1243  GLUCAP 213* 191* 178*   Lipid Profile: Recent Labs    11/08/19 1353  TRIG 135   Thyroid  Function Tests: No results for input(s): TSH, T4TOTAL, FREET4, T3FREE, THYROIDAB in the last 72 hours. Anemia Panel: Recent Labs    11/08/19 1353 10/25/2019 0447  FERRITIN 346* 442*   Urine analysis:    Component Value Date/Time   COLORURINE YELLOW (Ninnie Fein) 10/26/2019 0436   APPEARANCEUR HAZY (Shivangi Lutz) 10/13/2019 0436   APPEARANCEUR Hazy 12/23/2012 0956   LABSPEC 1.035 (H) 10/26/2019 0436   LABSPEC 1.025 12/23/2012 0956   PHURINE 5.0 10/28/2019 0436   GLUCOSEU >=500 (Isabelle Matt) 11/05/2019 0436   GLUCOSEU NEGATIVE 01/02/2019 1227   HGBUR NEGATIVE 10/15/2019 0436   BILIRUBINUR NEGATIVE 11/08/2019 0436   BILIRUBINUR postivie 1+ 01/28/2018 0824   BILIRUBINUR Negative 12/23/2012 0956   KETONESUR 20 (Trayveon Beckford) 10/20/2019 0436   PROTEINUR 100 (Denell Cothern) 11/07/2019 0436   UROBILINOGEN 0.2 01/02/2019 1227   NITRITE NEGATIVE 10/15/2019 0436   LEUKOCYTESUR NEGATIVE 10/30/2019 0436   LEUKOCYTESUR Negative 12/23/2012 0956    Radiological Exams on Admission: CT HEAD WO CONTRAST  Result Date: 10/28/2019 CLINICAL DATA:  Encephalopathy. EXAM: CT HEAD WITHOUT CONTRAST TECHNIQUE: Contiguous axial images were obtained from the base of the skull through the vertex without intravenous contrast. COMPARISON:  Head CT 07/28/2019 FINDINGS: Brain: No intracranial hemorrhage, mass effect, or midline shift. Stable degree of generalized atrophy. No hydrocephalus. The basilar cisterns are patent. Chronic bilateral occipital infarcts, unchanged from prior. Chronic left thalamic lacunar infarct. Stable background chronic small vessel ischemia. No evidence of territorial infarct or acute ischemia. No extra-axial or intracranial fluid collection. Vascular: Atherosclerosis of skullbase vasculature without hyperdense vessel or abnormal calcification. Skull: No fracture or focal lesion. Sinuses/Orbits: Chronic fluid level in left side of sphenoid sinus. Remaining paranasal sinuses are clear. Chronic opacification of posterior lower right mastoid air  cells. Other: None. IMPRESSION: 1. No acute intracranial abnormality. 2. Unchanged atrophy, chronic small vessel ischemia,  and remote occipital infarcts. Electronically Signed   By: Keith Rake M.D.   On: 10/15/2019 01:07   DG Chest Port 1 View  Result Date: 11/08/2019 CLINICAL DATA:  Worsening shortness of breath. COVID-19 positive. Hypertension. Coronary artery disease. EXAM: PORTABLE CHEST 1 VIEW COMPARISON:  07/31/2019 FINDINGS: Moderate right hemidiaphragm elevation. Prior median sternotomy. Midline trachea. Normal heart size. Atherosclerosis in the transverse aorta. No pleural effusion or pneumothorax. Diffuse interstitial opacities, superimposed upon chronic interstitial thickening. Relative sparing of the right lung base and left apex. IMPRESSION: Multifocal bilateral interstitial opacities, suspicious for COVID-19 pneumonia. Congestive heart failure could look similar, but would be expected to have cardiomegaly and pleural fluid. Aortic Atherosclerosis (ICD10-I70.0). Electronically Signed   By: Abigail Miyamoto M.D.   On: 11/08/2019 14:07    EKG: Independently reviewed. none  Assessment/Plan Active Problems:   Pneumonia due to COVID-19 virus  Acute Hypoxic Respiratory Failure 2/2 COVID 19 Pneumonia:  - Currently requiring 40 L HHFNC with 100% FiO2 - Continue steroids, remdesivir - Discussed risks/benefits, off label use, lack of clear evidence of actemra with family.  No hx of TB, hepatitis.  Family agreeable after discussion.   - Discussed plasma risk/benefits over phone with family.  Read consent to daughter/wife.   - blood cx pending - procalcitonin 0.23, downtrending, not suggestive of bacterial infection, suspect 2/2 COVID - CRP significantly elevated  - follow inflammatory markers - I/O, daily weights - IS, OOB, prone as able - likely limited with mental status - SLP evaluation   COVID-19 Labs  Recent Labs    11/08/19 1353 10/22/2019 0447 10/26/2019 1010  DDIMER  --   --   1.28*  FERRITIN 346* 442*  --   LDH 446*  --   --   CRP 28.0* 30.5*  --     Lab Results  Component Value Date   SARSCOV2NAA NEGATIVE 07/27/2019   [place] NEGATIVE 03/28/2019   HTN:  -continue metoprolol  Hypercholesterolemia -lipitor  Hx ofCAD (coronary artery disease)and elevated trop:Troponin elevated, but not c/w ACS.  No CP. Likely due to COVID-19 infection -Continue aspirin, Lipitor, prn NTG  PAF (paroxysmal atrial fibrillation) ([place]): -continue Eliquis and metoprolol  History of CVA (cerebrovascular accident): -on ASA, Eliquis and lipitor  Acute metabolic encephalopathyin the setting of dementiaand possibledelirium - head CT without acute abnormality - continue home xanax at lower dose and prn - delirium precautions  AKI (acute kidney injury) ([place]): - improved   DVT prophylaxis: eliquis Code Status: DNR - discussed over phone with pt wife and daughter Family Communication: wife/daughter - asking questions about their own covid infections, asked them to be seen/evaluated  Disposition Plan: pending improvement - overall prognosis is poor with significant hypoxia requiring HHFNC and AMS Consults called: [facility] Admission status: inpatient  40 min critical care time given AHRF 2/2 COVID 19 pneumonia requiring HHFNC  Fayrene Helper MD [facility] Pager AMION  If 7PM-7AM, please contact night-coverage [email or web address] Password [email or web address]  11/06/2019, 1:14 PM

## 2019-11-09 NOTE — Discharge Summary (Signed)
[facility] Discharge Summary   Patient: Kyle Richardson [id number]  PCP: Einar Pheasant, MD  Date of admission: 11/08/2019   Date of discharge: 10/16/2019      Discharge Diagnoses:  Principal Problem:   Acute respiratory disease due to COVID-19 virus Active Problems:   Hypertension   Hypercholesterolemia   CAD (coronary artery disease)   PAF (paroxysmal atrial fibrillation) (HCC)   History of CVA (cerebrovascular accident)   Acute metabolic encephalopathy   AKI (acute kidney injury) ([place])   Dementia ([place])   Elevated troponin   Admitted From: home Disposition:  Outside Hospital [place]  Recommendations for Outpatient Follow-up:  Discuss about actemra with family, pt was being carried away on stretcher at the time on my arrival  Discuss code status  Diet recommendation: Cardiac diet  Activity: The patient is advised to gradually reintroduce usual activities, as tolerated  Discharge Condition: stable  Code Status: Full code   History of present illness: As per the H and P dictated on admission, " Kyle Richardson is a 86 y.o. male with medical history significant of hypertension, hyperlipidemia, diabetes mellitus, stroke, anxiety, CAD, dementia, PAF on Eliquis, ocular myasthenia gravis, who presents with shortness of breath.  Per her daughter (I called her daughter by phone), patient has been having cough for several days, he had positive COVID-19 test on Monday.  Patient has a dry cough, no chest pain.  His shortness breath has been progressively worsening.  She was found to have oxygen desaturation to 70% on room air, needed high flow oxygen in ED.  Patient has been having diarrhea, but no nausea vomiting or abdominal pain per her daughter. Pt is more confused and agitated today. Per her daughter, patient usually intermittently gets confused in the late afternoon at home even when he was in her normal health condition.  In the situation, per her daughter, usually patient was  given prn Xanax with good control of his symptoms.  ED Course: pt was found to have positive Covid 19 Ag test, AKI with creatinine 1.37, BUN 30, troponin 35, WBC 7.4, temperature normal, blood pressure 151/80, heart rate 66, RR 22, oxygen saturation 97% on high flow nasal cannula oxygen.  Chest x-ray showed bilateral multifocal infiltration. "  Hospital Course:  Summary of his active problems in the hospital is as following.   Acute respiratory disease due to COVID-19 virus: Patient has oxygen desaturation to 70% on room air, currently needs high flow oxygen.  Chest x-ray with bilateral multifocal infiltration.  -will admit to SDU as inpt -Remdesivir per pharm -Solumedrol 40 mg bid -vitamin C, zinc.  -Bronchodilators -PRN Mucinex for cough -f/u Blood culture -Gentle IV fluid:  -D-dimer, BNP,Trop, LFT, CRP, LDH, Procalcitonin, Ferritin, fibinogen, TG, Hep B SAg, HIV ab -Daily CRP, Ferritin, D-dimer, -Will ask the patient to maintain an awake prone position for 16+ hours a day, if possible, with a minimum of 2-3 hours at a time -Will attempt to maintain euvolemia to a net negative fluid status -Dr. Mortimer Fries of PCCM is consulted  HTN:  -Continue home medications: Metoprolol -hydralazine prn  Hypercholesterolemia -lipitor  Hx of CAD (coronary artery disease) and elevated trop: trop 35. No CP.  Likely due to COVID-19 infection -Continue aspirin, Lipitor, prn NTG  PAF (paroxysmal atrial fibrillation) (HCC) : -continue Eliquis and metoprolol  History of CVA (cerebrovascular accident): -on ASA, Eliquis and lipitor  Acute metabolic encephalopathy in the setting of dementia and possible delirium -Frequent neuro check -negative CT-head since pt is on  -  prn Xanax   AKI (acute kidney injury) ([place]): -IVF: 500 cc of NS, then 75 cc/h On the day of the discharge the patient's vitals were stable, and no other acute medical condition were reported by patient. the patient was felt  safe to be discharge at [facility] with no therapy needed on discharge.  Consultants: PCCM  Procedures: none  Discharge Exam: General: Appear in moderate distress, talking in full sentences, confused and disoriented   Filed Weights   11/08/19 1349  Weight: 90.7 kg   Vitals:   11/08/2019 0745 11/08/2019 0805  BP:  136/70  Pulse: 71 76  Resp:  18  Temp:  98.6 F (37 C)  SpO2: 100% 100%    DISCHARGE MEDICATION: Allergies as of 10/15/2019      Reactions   Latex Swelling   Fentanyl Other (See Comments)   Reaction: confusion   Flomax [tamsulosin Hcl] Other (See Comments)   Unknown   Lamisil [terbinafine] Other (See Comments)   Unknown   Levofloxacin Other (See Comments)   Diplopia. NEVER put patient on levoquin!   Lorazepam Other (See Comments)   Reaction: confusion/ hallucinations    Metformin And Related Diarrhea   Sertraline Other (See Comments)   Unknown   Sulfa Antibiotics Other (See Comments)   Unknown   Versed [midazolam] Other (See Comments)   Hallucinations and confusion   Clarithromycin Diarrhea   Codeine Other (See Comments)   Unknown      Medication List    ASK your doctor about these medications   ALPRAZolam 0.5 MG tablet Commonly known as: XANAX Take 0.5 mg by mouth 3 (three) times daily.   apixaban 5 MG Tabs tablet Commonly known as: ELIQUIS Take 1 tablet (5 mg total) by mouth 2 (two) times daily.   aspirin 81 MG EC tablet Take 1 tablet (81 mg total) by mouth daily.   atorvastatin 40 MG tablet Commonly known as: LIPITOR TAKE ONE TABLET BY MOUTH AT BEDTIME   divalproex 125 MG capsule Commonly known as: DEPAKOTE SPRINKLE Take 125-250 mg by mouth 2 (two) times daily. One capsule at 1300-1400 and one and one-half (187.5) capsules at bedtime   fluticasone 50 MCG/ACT nasal spray Commonly known as: FLONASE Place 2 sprays into both nostrils daily.   latanoprost 0.005 % ophthalmic solution Commonly known as: XALATAN Place 1 drop into both  eyes at bedtime.   loratadine 10 MG tablet Commonly known as: CLARITIN Take 10 mg by mouth daily.   Melatonin 10 MG Tabs Take 10 mg by mouth at bedtime. Ask about: Which instructions should I use?   metoprolol succinate 25 MG 24 hr tablet Commonly known as: TOPROL-XL TAKE 1 TABLET BY MOUTH DAILY   nitroGLYCERIN 0.4 MG SL tablet Commonly known as: NITROSTAT Place 0.4 mg under the tongue every 5 (five) minutes as needed for chest pain.   tiZANidine 4 MG tablet Commonly known as: ZANAFLEX Take 1 tablet (4 mg total) by mouth daily as needed for muscle spasms.   traZODone 50 MG tablet Commonly known as: DESYREL Take 50 mg by mouth at bedtime.      Allergies  Allergen Reactions  . Latex Swelling  . Fentanyl Other (See Comments)    Reaction: confusion  . Flomax [Tamsulosin Hcl] Other (See Comments)    Unknown  . Lamisil [Terbinafine] Other (See Comments)    Unknown  . Levofloxacin Other (See Comments)    Diplopia. NEVER put patient on levoquin!  . Lorazepam Other (See Comments)  Reaction: confusion/ hallucinations   . Metformin And Related Diarrhea  . Sertraline Other (See Comments)    Unknown  . Sulfa Antibiotics Other (See Comments)    Unknown  . Versed [Midazolam] Other (See Comments)    Hallucinations and confusion  . Clarithromycin Diarrhea  . Codeine Other (See Comments)    Unknown     The results of significant diagnostics from this hospitalization (including imaging, microbiology, ancillary and laboratory) are listed below for reference.    Significant Diagnostic Studies: CT HEAD WO CONTRAST  Result Date: 10/18/2019 CLINICAL DATA:  Encephalopathy. EXAM: CT HEAD WITHOUT CONTRAST TECHNIQUE: Contiguous axial images were obtained from the base of the skull through the vertex without intravenous contrast. COMPARISON:  Head CT 07/28/2019 FINDINGS: Brain: No intracranial hemorrhage, mass effect, or midline shift. Stable degree of generalized atrophy. No  hydrocephalus. The basilar cisterns are patent. Chronic bilateral occipital infarcts, unchanged from prior. Chronic left thalamic lacunar infarct. Stable background chronic small vessel ischemia. No evidence of territorial infarct or acute ischemia. No extra-axial or intracranial fluid collection. Vascular: Atherosclerosis of skullbase vasculature without hyperdense vessel or abnormal calcification. Skull: No fracture or focal lesion. Sinuses/Orbits: Chronic fluid level in left side of sphenoid sinus. Remaining paranasal sinuses are clear. Chronic opacification of posterior lower right mastoid air cells. Other: None. IMPRESSION: 1. No acute intracranial abnormality. 2. Unchanged atrophy, chronic small vessel ischemia, and remote occipital infarcts. Electronically Signed   By: Keith Rake M.D.   On: 10/11/2019 01:07   DG Chest Port 1 View  Result Date: 11/08/2019 CLINICAL DATA:  Worsening shortness of breath. COVID-19 positive. Hypertension. Coronary artery disease. EXAM: PORTABLE CHEST 1 VIEW COMPARISON:  07/31/2019 FINDINGS: Moderate right hemidiaphragm elevation. Prior median sternotomy. Midline trachea. Normal heart size. Atherosclerosis in the transverse aorta. No pleural effusion or pneumothorax. Diffuse interstitial opacities, superimposed upon chronic interstitial thickening. Relative sparing of the right lung base and left apex. IMPRESSION: Multifocal bilateral interstitial opacities, suspicious for COVID-19 pneumonia. Congestive heart failure could look similar, but would be expected to have cardiomegaly and pleural fluid. Aortic Atherosclerosis (ICD10-I70.0). Electronically Signed   By: Abigail Miyamoto M.D.   On: 11/08/2019 14:07    Microbiology: Recent Results (from the past 240 hour(s))  Blood Culture (routine x 2)     Status: None (Preliminary result)   Collection Time: 11/08/19  1:53 PM   Specimen: BLOOD  Result Value Ref Range Status   Specimen Description BLOOD R WRIST  Final    Special Requests   Final    BOTTLES DRAWN AEROBIC AND ANAEROBIC Blood Culture adequate volume   Culture   Final    NO GROWTH < 24 HOURS Performed at [facility], [address], [place], [place] [address]    Report Status PENDING  Incomplete  Blood Culture (routine x 2)     Status: None (Preliminary result)   Collection Time: 11/08/19  1:53 PM   Specimen: BLOOD  Result Value Ref Range Status   Specimen Description BLOOD L WRIST  Final   Special Requests   Final    BOTTLES DRAWN AEROBIC AND ANAEROBIC Blood Culture adequate volume   Culture   Final    NO GROWTH < 24 HOURS Performed at [facility], [address] [place], [place] [address]    Report Status PENDING  Incomplete     Labs: CBC: Recent Labs  Lab 11/08/19 1353 10/10/2019 0447  WBC 7.4 9.1  NEUTROABS 6.0 8.1*  HGB 15.8 14.1  HCT 48.8  42.5  MCV 97.2 96.8  PLT 181 [id number]   Basic Metabolic Panel: Recent Labs  Lab 11/08/19 1353 10/25/2019 0447  NA 143 143  K 4.2 4.0  CL 107 109  CO2 25 20*  GLUCOSE 151* 179*  BUN 30* 30*  CREATININE 1.37* 0.91  CALCIUM 8.7* 8.4*  MG  --  1.9   Liver Function Tests: Recent Labs  Lab 11/08/19 1353 11/02/2019 0447  AST 32 37  ALT 18 20  ALKPHOS 63 60  BILITOT 0.8 0.8  PROT 7.3 6.3*  ALBUMIN 3.1* 2.6*   No results for input(s): LIPASE, AMYLASE in the last 168 hours. No results for input(s): AMMONIA in the last 168 hours. Cardiac Enzymes: No results for input(s): CKTOTAL, CKMB, CKMBINDEX, TROPONINI in the last 168 hours. BNP (last 3 results) Recent Labs    11/08/19 2238  BNP 104.0*   CBG: Recent Labs  Lab 10/17/2019 0110  GLUCAP 213*    Time spent: 35 minutes  Signed:  Berle Mull  [facility] 11/08/2019 9:03 AM

## 2019-11-09 NOTE — ED Notes (Signed)
[facility] here to transport pt 

## 2019-11-10 LAB — FERRITIN: Ferritin: 479 ng/mL — ABNORMAL HIGH (ref 24–336)

## 2019-11-10 LAB — GLUCOSE, CAPILLARY
Glucose-Capillary: 184 mg/dL — ABNORMAL HIGH (ref 70–99)
Glucose-Capillary: 188 mg/dL — ABNORMAL HIGH (ref 70–99)
Glucose-Capillary: 188 mg/dL — ABNORMAL HIGH (ref 70–99)
Glucose-Capillary: 190 mg/dL — ABNORMAL HIGH (ref 70–99)
Glucose-Capillary: 195 mg/dL — ABNORMAL HIGH (ref 70–99)
Glucose-Capillary: 225 mg/dL — ABNORMAL HIGH (ref 70–99)
Glucose-Capillary: 247 mg/dL — ABNORMAL HIGH (ref 70–99)

## 2019-11-10 LAB — CBC WITH DIFFERENTIAL/PLATELET
Abs Immature Granulocytes: 0.06 10*3/uL (ref 0.00–0.07)
Basophils Absolute: 0 10*3/uL (ref 0.0–0.1)
Basophils Relative: 1 %
Eosinophils Absolute: 0 10*3/uL (ref 0.0–0.5)
Eosinophils Relative: 0 %
HCT: 45.3 % (ref 39.0–52.0)
Hemoglobin: 14.6 g/dL (ref 13.0–17.0)
Immature Granulocytes: 1 %
Lymphocytes Relative: 11 %
Lymphs Abs: 1 10*3/uL (ref 0.7–4.0)
MCH: 31.9 pg (ref 26.0–34.0)
MCHC: 32.2 g/dL (ref 30.0–36.0)
MCV: 98.9 fL (ref 80.0–100.0)
Monocytes Absolute: 0.2 10*3/uL (ref 0.1–1.0)
Monocytes Relative: 2 %
Neutro Abs: 7.4 10*3/uL (ref 1.7–7.7)
Neutrophils Relative %: 85 %
Platelets: 216 10*3/uL (ref 150–400)
RBC: 4.58 MIL/uL (ref 4.22–5.81)
RDW: 12.8 % (ref 11.5–15.5)
WBC: 8.6 10*3/uL (ref 4.0–10.5)
nRBC: 0 % (ref 0.0–0.2)

## 2019-11-10 LAB — COMPREHENSIVE METABOLIC PANEL
ALT: 20 U/L (ref 0–44)
AST: 36 U/L (ref 15–41)
Albumin: 2.8 g/dL — ABNORMAL LOW (ref 3.5–5.0)
Alkaline Phosphatase: 67 U/L (ref 38–126)
Anion gap: 12 (ref 5–15)
BUN: 30 mg/dL — ABNORMAL HIGH (ref 8–23)
CO2: 24 mmol/L (ref 22–32)
Calcium: 8.8 mg/dL — ABNORMAL LOW (ref 8.9–10.3)
Chloride: 112 mmol/L — ABNORMAL HIGH (ref 98–111)
Creatinine, Ser: 0.84 mg/dL (ref 0.61–1.24)
GFR calc Af Amer: >60 mL/min (ref >60)
GFR calc non Af Amer: >60 mL/min (ref >60)
Glucose, Bld: 187 mg/dL — ABNORMAL HIGH (ref 70–99)
Potassium: 4.3 mmol/L (ref 3.5–5.1)
Sodium: 148 mmol/L — ABNORMAL HIGH (ref 135–145)
Total Bilirubin: 0.4 mg/dL (ref 0.3–1.2)
Total Protein: 6.9 g/dL (ref 6.5–8.1)

## 2019-11-10 LAB — PREPARE FRESH FROZEN PLASMA: Unit division: 0

## 2019-11-10 LAB — BPAM FFP
Blood Product Expiration Date: 202102011226
ISSUE DATE / TIME: 202101311230
Unit Type and Rh: 5100

## 2019-11-10 LAB — PHOSPHORUS: Phosphorus: 2.3 mg/dL — ABNORMAL LOW (ref 2.5–4.6)

## 2019-11-10 LAB — PROCALCITONIN: Procalcitonin: 0.17 ng/mL

## 2019-11-10 LAB — C-REACTIVE PROTEIN: CRP: 20.4 mg/dL — ABNORMAL HIGH (ref <1.0)

## 2019-11-10 LAB — MAGNESIUM: Magnesium: 2.2 mg/dL (ref 1.7–2.4)

## 2019-11-10 LAB — D-DIMER, QUANTITATIVE: D-Dimer, Quant: 1.24 ug/mL-FEU — ABNORMAL HIGH (ref 0.00–0.50)

## 2019-11-10 MED ORDER — DEXTROSE 5 % IV SOLN: INTRAVENOUS | Status: AC

## 2019-11-10 MED ORDER — HALOPERIDOL LACTATE 5 MG/ML IJ SOLN
1.0000 mg | Freq: Four times a day (QID) | INTRAMUSCULAR | Status: DC | PRN
  Administered 2019-11-10 – 2019-11-12 (×4): 1 mg via INTRAVENOUS
  Filled 2019-11-10 (×4): qty 1

## 2019-11-10 NOTE — Plan of Care (Signed)
  Problem: Respiratory: Goal: Will maintain a patent airway Outcome: Progressing Goal: Complications related to the disease process, condition or treatment will be avoided or minimized Outcome: Progressing   Problem: Clinical Measurements: Goal: Will remain free from infection Outcome: Progressing Goal: Diagnostic test results will improve Outcome: Progressing Goal: Respiratory complications will improve Outcome: Progressing Goal: Cardiovascular complication will be avoided Outcome: Progressing   Problem: Activity: Goal: Risk for activity intolerance will decrease Outcome: Progressing   Problem: Elimination: Goal: Will not experience complications related to bowel motility Outcome: Progressing Goal: Will not experience complications related to urinary retention Outcome: Progressing   Problem: Pain Managment: Goal: General experience of comfort will improve Outcome: Progressing   Problem: Safety: Goal: Ability to remain free from injury will improve Outcome: Progressing   Problem: Skin Integrity: Goal: Risk for impaired skin integrity will decrease Outcome: Progressing   Problem: Education: Goal: Knowledge of risk factors and measures for prevention of condition will improve Outcome: Not Progressing   Problem: Coping: Goal: Psychosocial and spiritual needs will be supported Outcome: Not Progressing   Problem: Education: Goal: Knowledge of General Education information will improve Description: Including pain rating scale, medication(s)/side effects and non-pharmacologic comfort measures Outcome: Not Progressing   Problem: Health Behavior/Discharge Planning: Goal: Ability to manage health-related needs will improve Outcome: Not Progressing   Problem: Clinical Measurements: Goal: Ability to maintain clinical measurements within normal limits will improve Outcome: Not Progressing   Problem: Nutrition: Goal: Adequate nutrition will be maintained Outcome: Not  Progressing

## 2019-11-10 NOTE — Progress Notes (Addendum)
PROGRESS NOTE    Kyle Richardson  DUK:[id number] DOB: 1928/09/03 DOA: 10/23/2019 PCP: Einar Pheasant, MD   Brief Narrative: Kyle Richardson is Kyle Richardson 86 y.o. male with medical history significant of CAD, CVA, T2DM, HTN, atrial fibrillation on eliquis, glaucoma and multiple other medical problems who presented to [facility] in the setting of COVID 19 infection and shortness of breath.  History obtained from medical record and family.  Patient had positive COVID 19 test on Monday.  Wife notes one of the family caregivers tested positive Sissi Padia few weeks ago and that the patient has been sick for several weeks.  He was found to have desaturation to 70% on RA and needed HF oxygen in the ED.  He had diarrhea, but no nausea, vomiting, or abdominal pain per HPI.  He's more confused and per discussion with family, often is confused at home, especially in the late afternoon.  He has dementia at baseline.  He hasn't been able to stand for several months.  Family uses hoyer lift to transfer him.   Assessment & Plan:   Active Problems:   Pneumonia due to COVID-19 virus   Pressure injury of skin  Acute Hypoxic Respiratory Failure 2/2 COVID 19 Pneumonia:  - Improved, now on NRB/15 L HFNC - Continue steroids, remdesivir - s/p actemra and plasma on 1/31 - Discussed plasma risk/benefits over phone with family.  Read consent to daughter/wife, filled out form and given to nursing.     - blood cx pending - procalcitonin 0.23, downtrending, not suggestive of bacterial infection, suspect 2/2 COVID - CRP significantly elevated, downtrending - follow inflammatory markers - I/O, daily weights - IS, OOB, prone as able - likely limited with mental status - SLP evaluation   COVID-19 Labs  Recent Labs    11/08/19 1353 11/06/2019 0447 11/06/2019 1010 11/10/19 0305  DDIMER  --   --  1.28* 1.24*  FERRITIN 346* 442*  --  479*  LDH 446*  --   --   --   CRP 28.0* 30.5*  --  20.4*    Lab Results  Component Value Date   [place] NEGATIVE 07/27/2019   [place] NEGATIVE 37/62/8315   Acute metabolic encephalopathyin the setting of Dementia  - likely 2/2 COVID infection and delirium in setting of his dementia - head CT without acute abnormality - continue home xanax at lower dose and prn - hold today, he was somnolent when I saw him this AM - delirium precautions - follow EKG for qt eval, consider haldol/seroquel as needed for agitation  HTN:  -continue metoprolol  Hypercholesterolemia -lipitor  Hx ofCAD (coronary artery disease)and elevated trop:Troponin elevated, but not c/w ACS.  No CP. Likely due to COVID-19 infection -Continue aspirin, Lipitor, prn NTG  PAF (paroxysmal atrial fibrillation) ([place]): -continue Eliquis and metoprolol  History of CVA (cerebrovascular accident): -on ASA, Eliquis and lipitor  AKI (acute kidney injury) ([place]): - improved   Hypernatremia - encourage PO, start D5 fluids and follow  Pressure Injury Frequent turns Pressure Injury 10/25/2019 Sacrum Mid Stage 1 -  Intact skin with non-blanchable redness of [place]ara Acklin localized area usually over Zuriel Yeaman bony prominence. (Active)  10/10/2019 0900  Location: Sacrum  Location Orientation: Mid  Staging: Stage 1 -  Intact skin with non-blanchable redness of Summar Mcglothlin localized area usually over Derita Michelsen bony prominence.  Wound Description (Comments):   Present on Admission: Yes   DVT prophylaxis: eliquis Code Status: DNR Family Communication: family 2/1 (daughter, wife) Disposition Plan:  . Patient came from: Home?           [facility]  Anticipated d/c place: pending . Barriers to d/c OR conditions which need to be met to effect Rilley Poulter safe d/c: pending improvement in resp status  Consultants:   none  Procedures:  none  Antimicrobials:  Anti-infectives (From admission, onward)   Start     Dose/Rate Route Frequency Ordered Stop   10/21/2019 1215  remdesivir 100 mg in sodium chloride 0.9 % 100 mL IVPB     100 mg 200 mL/hr over 30 Minutes  Intravenous Daily 10/16/2019 1201 11/13/19 0959     Subjective: Somnolent, arouses with sternal rub, but not to voice, does not speak, moans (nurse said he did better later and was able to eat breakfast)  Objective: Vitals:   11/10/19 0500 11/10/19 0525 11/10/19 0600 11/10/19 0808  BP:    140/61  Pulse: (!) 58 (!) 57 (!) 55 71  Resp: 20 (!) 31 (!) 26 20  Temp:    98 F (36.7 C)  TempSrc:    Axillary  SpO2: 91% (!) 84% 98% 93%  Height:        Intake/Output Summary (Last 24 hours) at 11/10/2019 1054 Last data filed at 10/22/2019 1819 Gross per 24 hour  Intake 550 ml  Output --  Net 550 ml   There were no vitals filed for this visit.  Examination:  General exam: Appears calm and comfortable  Respiratory system: Clear to auscultation. Respiratory effort normal. Cardiovascular system: S1 & S2 heard, RRR.  Gastrointestinal system: Abdomen is nondistended, soft and nontender Central nervous system: somnolent, withdrawals and pushes me away with sternal rub, but does not open eyes or speak, moans. No focal neurological deficits. Extremities: no LEE Skin: No rashes, lesions or ulcers  Data Reviewed: I have personally reviewed following labs and imaging studies  CBC: Recent Labs  Lab 11/08/19 1353 10/28/2019 0447 11/10/19 0305  WBC 7.4 9.1 8.6  NEUTROABS 6.0 8.1* 7.4  HGB 15.8 14.1 14.6  HCT 48.8 42.5 45.3  MCV 97.2 96.8 98.9  PLT 181 190 469   Basic Metabolic Panel: Recent Labs  Lab 11/08/19 1353 10/30/2019 0447 11/10/19 0305  NA 143 143 148*  K 4.2 4.0 4.3  CL 107 109 112*  CO2 25 20* 24  GLUCOSE 151* 179* 187*  BUN 30* 30* 30*  CREATININE 1.37* 0.91 0.84  CALCIUM 8.7* 8.4* 8.8*  MG  --  1.9 2.2  PHOS  --   --  2.3*   GFR: Estimated Creatinine Clearance: 65.4 mL/min (by C-G formula based on SCr of 0.84 mg/dL). Liver Function Tests: Recent Labs  Lab 11/08/19 1353 10/21/2019 0447 11/10/19 0305  AST 32 37 36  ALT '18 20 20  ' ALKPHOS 63 60 67  BILITOT 0.8 0.8  0.4  PROT 7.3 6.3* 6.9  ALBUMIN 3.1* 2.6* 2.8*   No results for input(s): LIPASE, AMYLASE in the last 168 hours. No results for input(s): AMMONIA in the last 168 hours. Coagulation Profile: No results for input(s): INR, PROTIME in the last 168 hours. Cardiac Enzymes: No results for input(s): CKTOTAL, CKMB, CKMBINDEX, TROPONINI in the last 168 hours. BNP (last 3 results) No results for input(s): PROBNP in the last 8760 hours. HbA1C: Recent Labs    11/08/19 2238  HGBA1C 8.7*   CBG: Recent Labs  Lab 10/20/2019 1728 10/19/2019 1957 10/15/2019 2358 11/10/19 0420 11/10/19 0750  GLUCAP 203* 227* 195* 188* 188*   Lipid Profile: Recent Labs    11/08/19 1353  TRIG 135   Thyroid Function Tests: No results for input(s): TSH,  T4TOTAL, FREET4, T3FREE, THYROIDAB in the last 72 hours. Anemia Panel: Recent Labs    11/07/2019 0447 11/10/19 0305  FERRITIN 442* 479*   Sepsis Labs: Recent Labs  Lab 11/08/19 1353 10/29/2019 1010 11/10/19 0305  PROCALCITON 0.23 0.17 0.17  LATICACIDVEN 1.6  --   --     Recent Results (from the past 240 hour(s))  Blood Culture (routine x 2)     Status: None (Preliminary result)   Collection Time: 11/08/19  1:53 PM   Specimen: BLOOD  Result Value Ref Range Status   Specimen Description BLOOD R WRIST  Final   Special Requests   Final    BOTTLES DRAWN AEROBIC AND ANAEROBIC Blood Culture adequate volume   Culture   Final    NO GROWTH 2 DAYS Performed at [facility], [address], [place], [place] [address]    Report Status PENDING  Incomplete  Blood Culture (routine x 2)     Status: None (Preliminary result)   Collection Time: 11/08/19  1:53 PM   Specimen: BLOOD  Result Value Ref Range Status   Specimen Description BLOOD L WRIST  Final   Special Requests   Final    BOTTLES DRAWN AEROBIC AND ANAEROBIC Blood Culture adequate volume   Culture   Final    NO GROWTH 2 DAYS Performed at [facility], [address]  [place],  [address]    Report Status PENDING  Incomplete         Radiology Studies: CT HEAD WO CONTRAST  Result Date: 10/28/2019 CLINICAL DATA:  Encephalopathy. EXAM: CT HEAD WITHOUT CONTRAST TECHNIQUE: Contiguous axial images were obtained from the base of the skull through the vertex without intravenous contrast. COMPARISON:  Head CT 07/28/2019 FINDINGS: Brain: No intracranial hemorrhage, mass effect, or midline shift. Stable degree of generalized atrophy. No hydrocephalus. The basilar cisterns are patent. Chronic bilateral occipital infarcts, unchanged from prior. Chronic left thalamic lacunar infarct. Stable background chronic small vessel ischemia. No evidence of territorial infarct or acute ischemia. No extra-axial or intracranial fluid collection. Vascular: Atherosclerosis of skullbase vasculature without hyperdense vessel or abnormal calcification. Skull: No fracture or focal lesion. Sinuses/Orbits: Chronic fluid level in left side of sphenoid sinus. Remaining paranasal sinuses are clear. Chronic opacification of posterior lower right mastoid air cells. Other: None. IMPRESSION: 1. No acute intracranial abnormality. 2. Unchanged atrophy, chronic small vessel ischemia, and remote occipital infarcts. Electronically Signed   By: Keith Rake M.D.   On: 10/20/2019 01:07   DG Chest Port 1 View  Result Date: 11/08/2019 CLINICAL DATA:  Worsening shortness of breath. COVID-19 positive. Hypertension. Coronary artery disease. EXAM: PORTABLE CHEST 1 VIEW COMPARISON:  07/31/2019 FINDINGS: Moderate right hemidiaphragm elevation. Prior median sternotomy. Midline trachea. Normal heart size. Atherosclerosis in the transverse aorta. No pleural effusion or pneumothorax. Diffuse interstitial opacities, superimposed upon chronic interstitial thickening. Relative sparing of the right lung base and left apex. IMPRESSION: Multifocal bilateral interstitial opacities, suspicious for COVID-19 pneumonia. Congestive  heart failure could look similar, but would be expected to have cardiomegaly and pleural fluid. Aortic Atherosclerosis (ICD10-I70.0). Electronically Signed   By: Abigail Miyamoto M.D.   On: 11/08/2019 14:07        Scheduled Meds: . apixaban  5 mg Oral BID  . vitamin C  500 mg Oral Daily  . atorvastatin  40 mg Oral QHS  . insulin aspart  0-9 Units Subcutaneous Q4H  . Ipratropium-Albuterol  1 puff Inhalation Q6H  . latanoprost  1 drop Both  Eyes QHS  . Melatonin  10 mg Oral QHS  . methylPREDNISolone (SOLU-MEDROL) injection  45 mg Intravenous Q12H  . metoprolol succinate  12.5 mg Oral Daily  . traZODone  50 mg Oral QHS  . valproic acid  125 mg Oral Q24H   And  . valproic acid  187.5 mg Oral QHS  . zinc sulfate  220 mg Oral Daily   Continuous Infusions: . remdesivir 100 mg in NS 100 mL 100 mg (10/11/2019 1223)     LOS: 1 day    Time spent: over 30 min    Fayrene Helper, MD [facility]   To contact the attending provider between 7A-7P or the covering provider during after hours 7P-7A, please log into the web site [email or web address] and access using universal [place] password for that web site. If you do not have the password, please call the hospital operator.  11/10/2019, 10:54 AM

## 2019-11-10 NOTE — Evaluation (Signed)
Clinical/Bedside Swallow Evaluation Patient Details  Name: Kyle Richardson MRN: [id number] Date of Birth: [birth year]  Today's Date: 11/10/2019 Time: SLP Start Time (ACUTE ONLY): 1200 SLP Stop Time (ACUTE ONLY): 1317 SLP Time Calculation (min) (ACUTE ONLY): 77 min  Past Medical History:  Past Medical History:  Diagnosis Date  . Abdominal fibromatosis   . Atrial fibrillation ([place])   . CAD (coronary artery disease)    s/p inferior MI with RV involvement 84/96.  s/p PTCA of the mid RCA 84/96, also  s/p  CABG x 4 7/96  . Complication of anesthesia    confusion and hallucinations for several days after receiving Versed  . Degenerative disc disease   . Dementia ([place])   . Diabetes mellitus ([place])   . Glaucoma   . Hypercholesterolemia   . Hypertension   . Migraine headache    history of  . Myocardial infarction ([place]) 1996  . Osteoarthritis   . Sleep apnea   . Stroke [facility]) 84/17/2018   affected left side    Past Surgical History:  Past Surgical History:  Procedure Laterality Date  . APPENDECTOMY    . CAROTID ANGIOGRAPHY Bilateral 84/09/2018   Procedure: CAROTID ANGIOGRAPHY;  Surgeon: Algernon Huxley, MD;  Location: [place] [facility];  Service: Cardiovascular;  Laterality: Bilateral;  . CAROTID PTA/STENT INTERVENTION Right 84/14/2019   Procedure: CAROTID PTA/STENT INTERVENTION;  Surgeon: Katha Cabal, MD;  Location: [place] [facility];  Service: Cardiovascular;  Laterality: Right;  . CATARACT EXTRACTION W/ INTRAOCULAR LENS  IMPLANT, BILATERAL    . CHOLECYSTECTOMY     Removed  . CORONARY ARTERY BYPASS GRAFT  7/96   x4  . HEMORRHOID SURGERY    . HERNIA REPAIR    . TONSILLECTOMY    . UPP and tonsillectomy  84/86   HPI:  86 y.o. male with medical history significant for dementia, CAD, CVA, T2DM, HTN, atrial fibrillation on eliquis, glaucoma and multiple other medical problems who presented to [facility] in the setting of COVID 19 infection and shortness of breath. Dx acute hypoxic  respiratory failure due to COVID pna.  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 Baseline communication c/b residual aphasia, able to carry on a conversaion, AAOx3, sundowning with decline in last six months.    Assessment / Plan / Recommendation Clinical Impression  Pt participated in limited swallow assessment given current MS.  He had difficulty rousing and maintaining alertness for full evaluation.  Pt demonstrated adequate oral anticipation/recognition of approaching spoon/cup; took small sips with no overt s/s of aspiration.  Large, successive boluses elicited coughing.  Sp02 dropped to as low as 79% when NRB was removed for assessment.  For now, recommend allowing small sips of liquid/bites of food if pt is sufficiently alert; otherwise, hold meal trays.  SLP will f/u next date for more indepth assessment if MS allows.  SLP Visit Diagnosis: Dysphagia, oropharyngeal phase (R13.12)    Aspiration Risk    tba   Diet Recommendation   continue current diet regular/thins, but feed only when alert; otherwise, hold tray  Medication Administration: Crushed with puree    Other  Recommendations Oral Care Recommendations: Oral care BID   Follow up Recommendations        Frequency and Duration min 2x/week  1 week       Prognosis Barriers to Reach Goals: Cognitive deficits      Swallow Study   General HPI: 86 y.o. male with medical history significant  for dementia, CAD, CVA, T2DM, HTN, atrial fibrillation on eliquis, glaucoma and multiple other medical problems who presented to [facility] in the setting of COVID 19 infection and shortness of breath. Dx acute hypoxic respiratory failure due to COVID pna.  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 Baseline communication c/b residual aphasia, able to carry on a conversaion, AAOx3, sundowning with decline in last six months.  Type of Study: Bedside Swallow  Evaluation Diet Prior to this Study: Regular;Thin liquids Temperature Spikes Noted: No Respiratory Status: Non-rebreather(15L) History of Recent Intubation: No Behavior/Cognition: Lethargic/Drowsy Oral Cavity Assessment: (unable to examine) Oral Care Completed by SLP: Recent completion by staff Self-Feeding Abilities: Total assist Patient Positioning: Upright in bed Baseline Vocal Quality: Normal Volitional Cough: Cognitively unable to elicit Volitional Swallow: Unable to elicit    Oral/Motor/Sensory Function Overall Oral Motor/Sensory Function: Other (comment)(no overt deficits at rest)   Ice Chips Ice chips: Not tested   Thin Liquid Thin Liquid: Impaired Presentation: Cup;Spoon Pharyngeal  Phase Impairments: Cough - Immediate(after large, consecutive  boluses)    Nectar Thick Nectar Thick Liquid: Not tested   Honey Thick Honey Thick Liquid: Not tested   Puree Puree: Not tested   Solid     Solid: Not tested      Kyle Richardson 11/10/2019,1:29 PM  Kyle Richardson Kyle Richardson, [place] Office number [phone]

## 2019-11-10 NOTE — Progress Notes (Signed)
Inpatient Diabetes Program Recommendations  AACE/ADA: New Consensus Statement on Inpatient Glycemic Control   Target Ranges:  Prepandial:   less than 140 mg/dL      Peak postprandial:   less than 180 mg/dL (1-2 hours)      Critically ill patients:  140 - 180 mg/dL   Results for Kyle Richardson, Kyle Richardson (MRN [id number]) as of 11/10/2019 12:16  Ref. Range 10/25/2019 09:08 10/19/2019 12:43 11/03/2019 17:28 10/30/2019 19:57 10/15/2019 23:58 11/10/2019 04:20 11/10/2019 07:50 11/10/2019 11:30  Glucose-Capillary Latest Ref Range: 70 - 99 mg/dL 191 (H) 178 (H) 203 (H) 227 (H) 195 (H) 188 (H) 188 (H) 184 (H)   Review of Glycemic Control  Diabetes history: No Outpatient Diabetes medications: NA Current orders for Inpatient glycemic control: Novolog 0-9 units Q4H; Solumedrol 45 mg Q12H   Inpatient Diabetes Program Recommendations:   Insulin-Meal Coverage: If steroids are continued, please consider ordering Novolog 2 units TID with meals for meal coverage if patient eats at least 50% of meals.  Thanks, Barnie Alderman, RN, MSN, CDE Diabetes Coordinator Inpatient Diabetes Program [phone] (Team Pager from 8am to 5pm)

## 2019-11-10 DEATH — deceased

## 2019-11-11 ENCOUNTER — Inpatient Hospital Stay (HOSPITAL_COMMUNITY): Payer: PPO

## 2019-11-11 DIAGNOSIS — R7989 Other specified abnormal findings of blood chemistry: Secondary | ICD-10-CM

## 2019-11-11 LAB — COMPREHENSIVE METABOLIC PANEL
ALT: 20 U/L (ref 0–44)
AST: 30 U/L (ref 15–41)
Albumin: 2.6 g/dL — ABNORMAL LOW (ref 3.5–5.0)
Alkaline Phosphatase: 71 U/L (ref 38–126)
Anion gap: 10 (ref 5–15)
BUN: 34 mg/dL — ABNORMAL HIGH (ref 8–23)
CO2: 24 mmol/L (ref 22–32)
Calcium: 8.7 mg/dL — ABNORMAL LOW (ref 8.9–10.3)
Chloride: 113 mmol/L — ABNORMAL HIGH (ref 98–111)
Creatinine, Ser: 0.97 mg/dL (ref 0.61–1.24)
GFR calc Af Amer: >60 mL/min (ref >60)
GFR calc non Af Amer: >60 mL/min (ref >60)
Glucose, Bld: 282 mg/dL — ABNORMAL HIGH (ref 70–99)
Potassium: 4 mmol/L (ref 3.5–5.1)
Sodium: 147 mmol/L — ABNORMAL HIGH (ref 135–145)
Total Bilirubin: 0.5 mg/dL (ref 0.3–1.2)
Total Protein: 6.4 g/dL — ABNORMAL LOW (ref 6.5–8.1)

## 2019-11-11 LAB — GLUCOSE, CAPILLARY
Glucose-Capillary: 240 mg/dL — ABNORMAL HIGH (ref 70–99)
Glucose-Capillary: 228 mg/dL — ABNORMAL HIGH (ref 70–99)
Glucose-Capillary: 173 mg/dL — ABNORMAL HIGH (ref 70–99)
Glucose-Capillary: 168 mg/dL — ABNORMAL HIGH (ref 70–99)
Glucose-Capillary: 156 mg/dL — ABNORMAL HIGH (ref 70–99)
Glucose-Capillary: 182 mg/dL — ABNORMAL HIGH (ref 70–99)

## 2019-11-11 LAB — CBC WITH DIFFERENTIAL/PLATELET
Abs Immature Granulocytes: 0.05 10*3/uL (ref 0.00–0.07)
Basophils Absolute: 0 10*3/uL (ref 0.0–0.1)
Basophils Relative: 0 %
Eosinophils Absolute: 0 10*3/uL (ref 0.0–0.5)
Eosinophils Relative: 0 %
HCT: 41.5 % (ref 39.0–52.0)
Hemoglobin: 13.5 g/dL (ref 13.0–17.0)
Immature Granulocytes: 1 %
Lymphocytes Relative: 9 %
Lymphs Abs: 0.8 10*3/uL (ref 0.7–4.0)
MCH: 32.2 pg (ref 26.0–34.0)
MCHC: 32.5 g/dL (ref 30.0–36.0)
MCV: 99 fL (ref 80.0–100.0)
Monocytes Absolute: 0.2 10*3/uL (ref 0.1–1.0)
Monocytes Relative: 2 %
Neutro Abs: 7.6 10*3/uL (ref 1.7–7.7)
Neutrophils Relative %: 88 %
Platelets: 217 10*3/uL (ref 150–400)
RBC: 4.19 MIL/uL — ABNORMAL LOW (ref 4.22–5.81)
RDW: 12.9 % (ref 11.5–15.5)
WBC: 8.7 10*3/uL (ref 4.0–10.5)
nRBC: 0 % (ref 0.0–0.2)

## 2019-11-11 LAB — C-REACTIVE PROTEIN: CRP: 11.3 mg/dL — ABNORMAL HIGH (ref <1.0)

## 2019-11-11 LAB — FERRITIN: Ferritin: 423 ng/mL — ABNORMAL HIGH (ref 24–336)

## 2019-11-11 LAB — D-DIMER, QUANTITATIVE: D-Dimer, Quant: 1.59 ug/mL-FEU — ABNORMAL HIGH (ref 0.00–0.50)

## 2019-11-11 LAB — PROCALCITONIN: Procalcitonin: 0.14 ng/mL

## 2019-11-11 LAB — MAGNESIUM: Magnesium: 2.2 mg/dL (ref 1.7–2.4)

## 2019-11-11 LAB — PHOSPHORUS: Phosphorus: 2.6 mg/dL (ref 2.5–4.6)

## 2019-11-11 MED ORDER — LINAGLIPTIN 5 MG PO TABS
5.0000 mg | ORAL_TABLET | Freq: Every day | ORAL | Status: DC
  Administered 2019-11-11 – 2019-11-12 (×2): 5 mg via ORAL
  Filled 2019-11-11 (×2): qty 1

## 2019-11-11 MED ORDER — INSULIN DETEMIR 100 UNIT/ML ~~LOC~~ SOLN
7.0000 [IU] | Freq: Two times a day (BID) | SUBCUTANEOUS | Status: DC
  Administered 2019-11-11 – 2019-11-12 (×3): 7 [IU] via SUBCUTANEOUS
  Filled 2019-11-11 (×5): qty 0.07

## 2019-11-11 MED ORDER — DEXTROSE 5 % IV SOLN: INTRAVENOUS | Status: DC

## 2019-11-11 MED ORDER — INSULIN ASPART 100 UNIT/ML ~~LOC~~ SOLN
0.0000 [IU] | SUBCUTANEOUS | Status: DC
  Administered 2019-11-11 (×2): 3 [IU] via SUBCUTANEOUS
  Administered 2019-11-11: 09:00:00 5 [IU] via SUBCUTANEOUS
  Administered 2019-11-11 – 2019-11-12 (×5): 3 [IU] via SUBCUTANEOUS
  Administered 2019-11-12: 16:00:00 2 [IU] via SUBCUTANEOUS
  Administered 2019-11-12 (×2): 3 [IU] via SUBCUTANEOUS
  Administered 2019-11-13: 04:00:00 2 [IU] via SUBCUTANEOUS
  Administered 2019-11-13: 12:00:00 3 [IU] via SUBCUTANEOUS

## 2019-11-11 MED ORDER — INSULIN DETEMIR 100 UNIT/ML ~~LOC~~ SOLN
4.0000 [IU] | Freq: Two times a day (BID) | SUBCUTANEOUS | Status: DC
  Administered 2019-11-11: 10:00:00 4 [IU] via SUBCUTANEOUS
  Filled 2019-11-11 (×2): qty 0.04

## 2019-11-11 NOTE — Progress Notes (Signed)
  Speech Language Pathology Treatment: Dysphagia  Patient Details Name: Kyle Richardson MRN: [id number] DOB: 1928/03/06 Today's Date: 11/11/2019 Time: [id number] SLP Time Calculation (min) (ACUTE ONLY): 25 min  Assessment / Plan / Recommendation Clinical Impression  Pt is presently unable to consume any food/liquid primarily due to cognitive impairments. Pt constantly verbalizing with intermittent intelligibility. On non re breather mask sating in the mid-upper 90's and. Oral examination revealed xerostomia and moisture provided with toothette/brush. Orally held boluses then released control allowing them to spill from mouth. He then expectorated all trials but one. Pudding bolus held then initiated swallow. Verbal support requesting pt swallow and not spit and then observed without giving cues with same results. RN arrived and hooked up suction which was previously not working and remainder cleared from oral cavity. Pt has diet ordered however should NOT attempt to feed unless cognitive status improves. Continue to provide oral care for pt and ST will follow.      HPI HPI: 86 y.o. male with medical history significant for dementia, CAD, CVA, T2DM, HTN, atrial fibrillation on eliquis, glaucoma and multiple other medical problems who presented to [facility] in the setting of COVID 19 infection and shortness of breath. Dx acute hypoxic respiratory failure due to COVID pna.  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 Baseline communication c/b residual aphasia, able to carry on a conversaion, AAOx3, sundowning with decline in last six months.       SLP Plan  Continue with current plan of care       Recommendations  Diet recommendations: NPO;Other(comment)(ice chips)                Oral Care Recommendations: Oral care QID Follow up Recommendations: None SLP Visit Diagnosis: Dysphagia, oral phase (R13.11) Plan: Continue with current plan of care                        Houston Siren 11/11/2019, 2:37 PM Orbie Pyo Colvin Caroli.Ed Risk analyst [phone] Office [phone]

## 2019-11-11 NOTE — Progress Notes (Signed)
Updated pt daughter over the phone. She was thankful for update. She said she will call back to set up time for [facility] call.

## 2019-11-11 NOTE — Progress Notes (Addendum)
PROGRESS NOTE    Kyle Richardson  SJG:[id number] DOB: 08-31-1928 DOA: 10/14/2019 PCP: Kyle Pheasant, MD   Brief Narrative: Kyle Richardson is Kyle Richardson 86 y.o. male with medical history significant of CAD, CVA, T2DM, HTN, atrial fibrillation on eliquis, glaucoma and multiple other medical problems who presented to [facility] in the setting of COVID 19 infection and shortness of breath.  History obtained from medical record and family.  Patient had positive COVID 19 test on Monday.  Wife notes one of the family caregivers tested positive Kyle Richardson few weeks ago and that the patient has been sick for several weeks.  He was found to have desaturation to 70% on RA and needed HF oxygen in the ED.  He had diarrhea, but no nausea, vomiting, or abdominal pain per HPI.  He's more confused and per discussion with family, often is confused at home, especially in the late afternoon.  He has dementia at baseline.  He hasn't been able to stand for several months.  Family uses hoyer lift to transfer him.   He was admitted for COVID 19 pneumonia requiring heated high flow on presentation.  He received actemra and plasma on 1/31.  He's continuing on steroids and remdesivir.  Overall prognosis is poor, though his oxygen needs have improved since admission.  Continue to monitor and discuss with family.   Assessment & Plan:   Active Problems:   Pneumonia due to COVID-19 virus   Pressure injury of skin  Acute Hypoxic Respiratory Failure 2/2 COVID 19 Pneumonia:  - Improved, now on NRB/15 L HFNC - Continue steroids, remdesivir - s/p actemra and plasma on 1/31    - blood cx pending - NGTD x3 - procalcitonin 0.23, downtrending, not suggestive of bacterial infection, suspect 2/2 COVID - CRP significantly elevated, downtrending - d dimer trending up slightly, follow LE [place] - follow inflammatory markers - I/O, daily weights - IS, OOB, prone as able - likely limited with mental status - SLP evaluation - recommending feeding only while  awake and alert - will c/s palliative care  COVID-19 Labs  Recent Labs    11/01/2019 0447 11/05/2019 1010 11/10/19 0305 11/11/19 0257  DDIMER  --  1.28* 1.24* 1.59*  FERRITIN 442*  --  479* 423*  CRP 30.5*  --  20.4* 11.3*    Lab Results  Component Value Date   [place] NEGATIVE 07/27/2019   [place] NEGATIVE 65/46/5035   Acute metabolic encephalopathyin the setting of Dementia  - likely 2/2 COVID infection and delirium in setting of his dementia - head CT without acute abnormality - continue home xanax at lower dose and prn - on hold due to somnolence on 2/1 - delirium precautions - haldol prn agitation, QTc 470's on 2/1  HTN:  -continue metoprolol  Hypercholesterolemia -lipitor  Hx ofCAD (coronary artery disease)and elevated trop:Troponin elevated, but not c/w ACS.  No CP. Likely due to COVID-19 infection -Continue aspirin, Lipitor, prn NTG  PAF (paroxysmal atrial fibrillation) ([place]): -continue Eliquis and metoprolol  History of CVA (cerebrovascular accident): -on ASA, Eliquis and lipitor  AKI (acute kidney injury) ([place]): - improved   Hypernatremia - encourage PO, start D5 fluids and follow  T2DM: A1c 8.7 continue SSI and basal, follow  Pressure Injury Frequent turns Pressure Injury 10/29/2019 Sacrum Mid Stage 1 -  Intact skin with non-blanchable redness of Kyle Richardson localized area usually over Kyle Richardson bony prominence. (Active)  10/11/2019 0900  Location: Sacrum  Location Orientation: Mid  Staging: Stage 1 -  Intact skin with non-blanchable redness  of Kyle Richardson localized area usually over Kyle Richardson bony prominence.  Wound Description (Comments):   Present on Admission: Yes   DVT prophylaxis: eliquis Code Status: DNR Family Communication: family 2/2 (daughter, wife)  Disposition Plan:  . Patient came from: Home?           [facility] Anticipated d/c place: pending . Barriers to d/c OR conditions which need to be met to effect Kyle Richardson safe d/c: pending improvement in resp  status  Consultants:   none  Procedures:  none  Antimicrobials:  Anti-infectives (From admission, onward)   Start     Dose/Rate Route Frequency Ordered Stop   10/17/2019 1215  remdesivir 100 mg in sodium chloride 0.9 % 100 mL IVPB     100 mg 200 mL/hr over 30 Minutes Intravenous Daily 10/12/2019 1201 11/13/19 0959     Subjective: Difficult to understand, though more alert today and talking  Objective: Vitals:   11/11/19 0659 11/11/19 0740 11/11/19 1012 11/11/19 1200  BP:  (!) 116/56 (!) 127/53 (!) 144/65  Pulse:  (!) 52 (!) 58 70  Resp: 13 (!) 24  18  Temp:  98 F (36.7 C)  98.7 F (37.1 C)  TempSrc:  Axillary  Axillary  SpO2:  93%  97%  Height:        Intake/Output Summary (Last 24 hours) at 11/11/2019 1440 Last data filed at 11/11/2019 0600 Gross per 24 hour  Intake 728.64 ml  Output 850 ml  Net -121.36 ml   There were no vitals filed for this visit.  Examination:  General: No acute distress. Cardiovascular: Heart sounds show Kyle Richardson regular rate, and rhythm.  Lungs: Clear to auscultation bilaterally Abdomen: Soft, nontender, nondistended Neurological: Alert and disoriented, talking today, difficult to understand. Moves all extremities 4. Cranial nerves II through XII grossly intact. Skin: Warm and dry. No rashes or lesions. Extremities: No clubbing or cyanosis. No edema.  Data Reviewed: I have personally reviewed following labs and imaging studies  CBC: Recent Labs  Lab 11/08/19 1353 10/27/2019 0447 11/10/19 0305 11/11/19 0257  WBC 7.4 9.1 8.6 8.7  NEUTROABS 6.0 8.1* 7.4 7.6  HGB 15.8 14.1 14.6 13.5  HCT 48.8 42.5 45.3 41.5  MCV 97.2 96.8 98.9 99.0  PLT 181 190 216 384   Basic Metabolic Panel: Recent Labs  Lab 11/08/19 1353 10/25/2019 0447 11/10/19 0305 11/11/19 0257  NA 143 143 148* 147*  K 4.2 4.0 4.3 4.0  CL 107 109 112* 113*  CO2 25 20* 24 24  GLUCOSE 151* 179* 187* 282*  BUN 30* 30* 30* 34*  CREATININE 1.37* 0.91 0.84 0.97  CALCIUM 8.7* 8.4*  8.8* 8.7*  MG  --  1.9 2.2 2.2  PHOS  --   --  2.3* 2.6   GFR: Estimated Creatinine Clearance: 56.7 mL/min (by C-G formula based on SCr of 0.97 mg/dL). Liver Function Tests: Recent Labs  Lab 11/08/19 1353 10/31/2019 0447 11/10/19 0305 11/11/19 0257  Richardson 32 37 36 30  ALT _0 ALKPHOS 63 60 67 71  BILITOT 0.8 0.8 0.4 0.5  PROT 7.3 6.3* 6.9 6.4*  ALBUMIN 3.1* 2.6* 2.8* 2.6*   No results for input(s): LIPASE, AMYLASE in the last 168 hours. No results for input(s): AMMONIA in the last 168 hours. Coagulation Profile: No results for input(s): INR, PROTIME in the last 168 hours. Cardiac Enzymes: No results for input(s): CKTOTAL, CKMB, CKMBINDEX, TROPONINI in the last 168 hours. BNP (last 3 results) No results for input(s): PROBNP in  the last 8760 hours. HbA1C: Recent Labs    11/08/19 2238  HGBA1C 8.7*   CBG: Recent Labs  Lab 11/10/19 1946 11/10/19 2327 11/11/19 0355 11/11/19 0749 11/11/19 1249  GLUCAP 190* 247* 240* 228* 173*   Lipid Profile: No results for input(s): CHOL, HDL, LDLCALC, TRIG, CHOLHDL, LDLDIRECT in the last 72 hours. Thyroid Function Tests: No results for input(s): TSH, T4TOTAL, FREET4, T3FREE, THYROIDAB in the last 72 hours. Anemia Panel: Recent Labs    11/10/19 0305 11/11/19 0257  FERRITIN 479* 423*   Sepsis Labs: Recent Labs  Lab 11/08/19 1353 11/05/2019 1010 11/10/19 0305 11/11/19 0257  PROCALCITON 0.23 0.17 0.17 0.14  LATICACIDVEN 1.6  --   --   --     Recent Results (from the past 240 hour(s))  Blood Culture (routine x 2)     Status: None (Preliminary result)   Collection Time: 11/08/19  1:53 PM   Specimen: BLOOD  Result Value Ref Range Status   Specimen Description BLOOD R WRIST  Final   Special Requests   Final    BOTTLES DRAWN AEROBIC AND ANAEROBIC Blood Culture adequate volume   Culture   Final    NO GROWTH 3 DAYS Performed at [facility], [address] [place], [place] [address]    Report Status PENDING   Incomplete  Blood Culture (routine x 2)     Status: None (Preliminary result)   Collection Time: 11/08/19  1:53 PM   Specimen: BLOOD  Result Value Ref Range Status   Specimen Description BLOOD L WRIST  Final   Special Requests   Final    BOTTLES DRAWN AEROBIC AND ANAEROBIC Blood Culture adequate volume   Culture   Final    NO GROWTH 3 DAYS Performed at [facility], [address] [place], [place] [address]    Report Status PENDING  Incomplete         Radiology Studies: No results found.      Scheduled Meds: . apixaban  5 mg Oral BID  . vitamin C  500 mg Oral Daily  . atorvastatin  40 mg Oral QHS  . insulin aspart  0-15 Units Subcutaneous Q4H  . insulin detemir  4 Units Subcutaneous BID  . Ipratropium-Albuterol  1 puff Inhalation Q6H  . latanoprost  1 drop Both Eyes QHS  . Melatonin  10 mg Oral QHS  . methylPREDNISolone (SOLU-MEDROL) injection  45 mg Intravenous Q12H  . metoprolol succinate  12.5 mg Oral Daily  . traZODone  50 mg Oral QHS  . valproic acid  125 mg Oral Q24H   And  . valproic acid  187.5 mg Oral QHS  . zinc sulfate  220 mg Oral Daily   Continuous Infusions: . remdesivir 100 mg in NS 100 mL 100 mg (11/11/19 1046)     LOS: 2 days    Time spent: over 30 min    Fayrene Helper, MD [facility]   To contact the attending provider between 7A-7P or the covering provider during after hours 7P-7A, please log into the web site [email or web address] and access using universal  password for that web site. If you do not have the password, please call the hospital operator.  11/11/2019, 2:40 PM

## 2019-11-11 NOTE — Progress Notes (Signed)
Bilateral lower extremity venous duplex complete.  Please see CV Proc tab for preliminary results. Lita Mains- RDMS, RVT 5:33 PM  11/11/2019

## 2019-11-11 NOTE — Progress Notes (Signed)
Contacted MD and adv that nightshift RN sts pt had 2 BM's overnight that were type 6 stool, not diarrhea. MD sts we no longer need to collect stool sample for c-diff screening and can stop enteric precautions.

## 2019-11-11 NOTE — TOC Initial Note (Signed)
Transition of Care [facility]) - Initial/Assessment Note    Patient Details  Name: Kyle Richardson MRN: [id number] Date of Birth: [birth year]  Transition of Care [facility]) CM/SW Contact:    Ammarie Matsuura, Chauncey Reading, RN Phone Number: 11/11/2019, 11:06 AM  Clinical Narrative:  86 y.o. male with medical history significant of CAD, CVA, T2DM, HTN, atrial fibrillation on eliquis, glaucoma and covid +. From home with family, total care. Call to wife to start discharge planning, introduced self/role to wife and explained the need to have discussion earlys on so that plan can be outlined prior to day of discharge.  Family has been paying out of pocket for care with [facility]. Have all needed DME  Wife reports that she and her daughter are both home with Covid.   Wife reports that money is running out in regards to paying for care, they also pay for care for their mentally handicapped son. Wife reports she is unclear as to what they will when patient discharges. Discussed with wife what HTA will pay for and that family would need to look into Medicaid to see if they are eligible if needed for long term care.  Wife voiced understanding.  TOC to follow and assist with  Needs.  Expected Discharge Plan: Long Term Nursing Home Barriers to Discharge: Continued Medical Work up  Expected Discharge Plan and Services Expected Discharge Plan: [place]   Discharge Planning Services: CM Consult   Living arrangements for the past 2 months: Single Family Home                                      Prior Living Arrangements/Services Living arrangements for the past 2 months: Single Family Home            Need for Family Participation in Patient Care: Yes (Comment) Care giver support system in place?: Yes (comment) Current home services: DME    Activities of Daily Living Home Assistive Devices/Equipment: [employer] fast streamer, Bedside commode/3-in-1, Shower chair with back ADL Screening (condition at  time of admission) Patient's cognitive ability adequate to safely complete daily activities?: No Is the patient deaf or have difficulty hearing?: Yes Does the patient have difficulty seeing, even when wearing glasses/contacts?: Yes Does the patient have difficulty concentrating, remembering, or making decisions?: Yes Patient able to express need for assistance with ADLs?: No Does the patient have difficulty dressing or bathing?: Yes Independently performs ADLs?: No Communication: Independent Dressing (OT): Dependent Is this a change from baseline?: Pre-admission baseline Grooming: Dependent Is this a change from baseline?: Pre-admission baseline Feeding: Dependent Is this a change from baseline?: Pre-admission baseline Bathing: Dependent Is this a change from baseline?: Pre-admission baseline Toileting: Dependent Is this a change from baseline?: Pre-admission baseline In/Out Bed: Dependent Is this a change from baseline?: Pre-admission baseline Walks in Home: Dependent Is this a change from baseline?: Pre-admission baseline Does the patient have difficulty walking or climbing stairs?: Yes Weakness of Legs: Both Weakness of Arms/Hands: Both  Permission Sought/Granted                  Emotional Assessment              Admission diagnosis:  COVID-19 [U07.1] CAD (coronary artery disease) [I25.10] Afib (HCC) [I48.91] FH: HTN (hypertension) [Z82.49] Diabetes ([place]) [E11.9] AKI (acute kidney injury) ([place]) [N17.9] Pneumonia due to COVID-19 virus [U07.1, J12.82] Patient Active Problem List  Diagnosis Date Noted  . Pneumonia due to COVID-19 virus 10/20/2019  . Pressure injury of skin 11/02/2019  . AKI (acute kidney injury) ([place]) 11/08/2019  . Acute respiratory disease due to COVID-19 virus 11/08/2019  . Dementia ([place]) 11/08/2019  . Elevated troponin 11/08/2019  . Scrotal edema 08/16/2019  . Pneumonia 07/28/2019  . Sepsis ([place]) 04/02/2019  . Acute encephalopathy  03/28/2019  . Diarrhea 03/09/2019  . Agitation 02/02/2019  . Stroke (cerebrum) ([place]) 10/11/2018  . Ureteral stone 08/25/2018  . Hydroureteronephrosis 08/05/2018  . Rotator cuff tendinitis, right 07/22/2018  . CVA, old, dysarthria 06/04/2018  . Hemiparesis affecting dominant side as late effect of cerebrovascular accident ([place]) 05/27/2018  . Acute metabolic encephalopathy   . Carotid stenosis, symptomatic, with infarction ([place]) 05/22/2018  . TIA (transient ischemic attack) 04/15/2018  . Episode of confusion 04/08/2018  . Labile blood pressure   . Acute ischemic left PCA stroke ([place]) 02/27/2018  . Diabetes mellitus type 2 in nonobese (HCC)   . Glaucoma   . MG, ocular (myasthenia gravis) ([place])   . History of CVA (cerebrovascular accident) 02/24/2018  . Right homonymous hemianopsia 02/23/2018  . Unresponsive episode 02/22/2018  . UTI (urinary tract infection) 01/28/2018  . Abnormal chest CT 09/20/2017  . Double vision 01/21/2017  . Ascending aortic aneurysm ([place]) 12/22/2016  . Blood-tinged sputum 10/23/2016  . Cough 10/23/2016  . PAF (paroxysmal atrial fibrillation) ([place]) 09/14/2016  . Health care maintenance 12/26/2014  . Stress 07/05/2014  . Apnea, sleep 06/18/2014  . Migraine 05/10/2013  . Calculus of kidney 01/01/2013  . Hypertension 08/20/2012  . Hypercholesterolemia 08/20/2012  . CAD (coronary artery disease) 08/20/2012  . Benign localized hyperplasia of prostate without urinary obstruction and other lower urinary tract symptoms (LUTS) 02/28/2012   PCP:  Einar Pheasant, MD Pharmacy:   [place], [place] - [address] [place] [address] Phone: [phone] Fax: [phone]  [id number] - [place], [place] - [address] [place] [address] Phone: [phone] Fax: [phone]     Social Determinants of Health (SDOH) Interventions    Readmission Risk Interventions No flowsheet data  found.

## 2019-11-12 DIAGNOSIS — L89151 Pressure ulcer of sacral region, stage 1: Secondary | ICD-10-CM

## 2019-11-12 LAB — COMPREHENSIVE METABOLIC PANEL
ALT: 25 U/L (ref 0–44)
AST: 45 U/L — ABNORMAL HIGH (ref 15–41)
Albumin: 3 g/dL — ABNORMAL LOW (ref 3.5–5.0)
Alkaline Phosphatase: 106 U/L (ref 38–126)
Anion gap: 13 (ref 5–15)
BUN: 28 mg/dL — ABNORMAL HIGH (ref 8–23)
CO2: 23 mmol/L (ref 22–32)
Calcium: 8.9 mg/dL (ref 8.9–10.3)
Chloride: 111 mmol/L (ref 98–111)
Creatinine, Ser: 0.91 mg/dL (ref 0.61–1.24)
GFR calc Af Amer: >60 mL/min (ref >60)
GFR calc non Af Amer: >60 mL/min (ref >60)
Glucose, Bld: 202 mg/dL — ABNORMAL HIGH (ref 70–99)
Potassium: 4 mmol/L (ref 3.5–5.1)
Sodium: 147 mmol/L — ABNORMAL HIGH (ref 135–145)
Total Bilirubin: 0.9 mg/dL (ref 0.3–1.2)
Total Protein: 7 g/dL (ref 6.5–8.1)

## 2019-11-12 LAB — CBC WITH DIFFERENTIAL/PLATELET
Abs Immature Granulocytes: 0.06 10*3/uL (ref 0.00–0.07)
Basophils Absolute: 0 10*3/uL (ref 0.0–0.1)
Basophils Relative: 0 %
Eosinophils Absolute: 0 10*3/uL (ref 0.0–0.5)
Eosinophils Relative: 0 %
HCT: 46.5 % (ref 39.0–52.0)
Hemoglobin: 15.4 g/dL (ref 13.0–17.0)
Immature Granulocytes: 1 %
Lymphocytes Relative: 6 %
Lymphs Abs: 0.6 10*3/uL — ABNORMAL LOW (ref 0.7–4.0)
MCH: 32.2 pg (ref 26.0–34.0)
MCHC: 33.1 g/dL (ref 30.0–36.0)
MCV: 97.1 fL (ref 80.0–100.0)
Monocytes Absolute: 0.2 10*3/uL (ref 0.1–1.0)
Monocytes Relative: 2 %
Neutro Abs: 8.5 10*3/uL — ABNORMAL HIGH (ref 1.7–7.7)
Neutrophils Relative %: 91 %
Platelets: 228 10*3/uL (ref 150–400)
RBC: 4.79 MIL/uL (ref 4.22–5.81)
RDW: 12.7 % (ref 11.5–15.5)
WBC: 9.5 10*3/uL (ref 4.0–10.5)
nRBC: 0 % (ref 0.0–0.2)

## 2019-11-12 LAB — GLUCOSE, CAPILLARY
Glucose-Capillary: 122 mg/dL — ABNORMAL HIGH (ref 70–99)
Glucose-Capillary: 164 mg/dL — ABNORMAL HIGH (ref 70–99)
Glucose-Capillary: 167 mg/dL — ABNORMAL HIGH (ref 70–99)
Glucose-Capillary: 170 mg/dL — ABNORMAL HIGH (ref 70–99)
Glucose-Capillary: 190 mg/dL — ABNORMAL HIGH (ref 70–99)
Glucose-Capillary: 199 mg/dL — ABNORMAL HIGH (ref 70–99)

## 2019-11-12 LAB — C-REACTIVE PROTEIN: CRP: 8.3 mg/dL — ABNORMAL HIGH (ref <1.0)

## 2019-11-12 LAB — PHOSPHORUS: Phosphorus: 3.1 mg/dL (ref 2.5–4.6)

## 2019-11-12 LAB — D-DIMER, QUANTITATIVE: D-Dimer, Quant: 3.3 ug/mL-FEU — ABNORMAL HIGH (ref 0.00–0.50)

## 2019-11-12 LAB — MAGNESIUM: Magnesium: 2.2 mg/dL (ref 1.7–2.4)

## 2019-11-12 LAB — FERRITIN: Ferritin: 478 ng/mL — ABNORMAL HIGH (ref 24–336)

## 2019-11-12 MED ORDER — CHLORHEXIDINE GLUCONATE CLOTH 2 % EX PADS
6.0000 | MEDICATED_PAD | Freq: Every day | CUTANEOUS | Status: DC
  Administered 2019-11-12 – 2019-11-13 (×2): 6 via TOPICAL

## 2019-11-12 MED ORDER — METOPROLOL TARTRATE 5 MG/5ML IV SOLN
2.5000 mg | INTRAVENOUS | Status: DC | PRN
  Administered 2019-11-12: 22:00:00 2.5 mg via INTRAVENOUS
  Filled 2019-11-12 (×2): qty 5

## 2019-11-12 MED ORDER — MORPHINE SULFATE (PF) 2 MG/ML IV SOLN
2.0000 mg | INTRAVENOUS | Status: DC | PRN
  Administered 2019-11-12 – 2019-11-13 (×2): 2 mg via INTRAVENOUS
  Filled 2019-11-12 (×2): qty 1

## 2019-11-12 NOTE — Progress Notes (Signed)
Palliative:  Consult received and chart reviewed. Briefly spoke with patient's daughter - she is unable to discuss care right now and would like patient's spouse on the call. We planned to discuss care at 10:30 AM on 2/4.  Juel Burrow, DNP, AGNP-C Palliative Medicine Team Team Phone # [phone]  Pager # [phone]  NO CHARGE

## 2019-11-12 NOTE — Progress Notes (Signed)
  Speech Language Pathology Treatment: Dysphagia  Patient Details Name: Kyle Richardson MRN: [id number] DOB: 05-Oct-1928 Today's Date: 11/12/2019 Time: [id number] SLP Time Calculation (min) (ACUTE ONLY): 13 min  Assessment / Plan / Recommendation Clinical Impression  Pt more attentive to PO today than reported in yesterdays note. Pt alert, able to follow one step commands with increased volume to left ear and multiple repetitions. Pt is very HOH. Oral care still needed prior to PO due to thick stringy bloody secretions. Pt able to attempt straw sips, but lacks the strength and coordination to pull a significant bolus. Ongoing anterior spillage with cup sip. Most successful with adequate quantity, no spillage or holding with total assist spoon feeding nectar thick liquids. No immediate signs of aspiration. Appetite still very poor. Discussed starting a puree/nectar teaspoon diet with RN, but with ongoing attention to mentation and lethargy; hold PO if pt very confused.    HPI HPI: 86 y.o. male with medical history significant for dementia, CAD, CVA, T2DM, HTN, atrial fibrillation on eliquis, glaucoma and multiple other medical problems who presented to [facility] in the setting of COVID 19 infection and shortness of breath. Dx acute hypoxic respiratory failure due to COVID pna.  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 Baseline communication c/b residual aphasia, able to carry on a conversaion, AAOx3, sundowning with decline in last six months.       SLP Plan  Continue with current plan of care       Recommendations  Diet recommendations: Dysphagia 1 (puree);Nectar-thick liquid Liquids provided via: Teaspoon Medication Administration: Crushed with puree Supervision: Full supervision/cueing for compensatory strategies Compensations: Slow rate;Small sips/bites Postural Changes and/or Swallow Maneuvers: Seated upright 90 degrees                Oral Care  Recommendations: Oral care QID Follow up Recommendations: Skilled Nursing facility SLP Visit Diagnosis: Dysphagia, oral phase (R13.11) Plan: Continue with current plan of care       GO               Herbie Baltimore, MA [place] Pager [phone] Office [phone]  Lynann Beaver 11/12/2019, 11:08 AM

## 2019-11-12 NOTE — Progress Notes (Signed)
21:14 Patients cardiac rhythm converted to Afib RVR; heart rate fluctuating from 121-169. Remaining mostly in the 120's. Contacted monitor tech who confirmed time of conversion 21:00.   Physician paged, order for lopressor placed due to the fact that patient may have not received daily oral dosage due to aspiration attempt stated by day shift nurse during report.  EKG performed stating, "Acute MI". Physician notified and discussed results. Physician reviewed, and discussed strips. Order for morphine and atropine placed.   Request for oral medications to be discontinued placed. Patient does not swallow, patient holds crushed meds in mouth then begin to aspirate, suctioning is then required as I hear gurgling in patients throat.

## 2019-11-12 NOTE — Progress Notes (Addendum)
PROGRESS NOTE  Kyle Richardson  [id number] DOB: 06/20/1928 DOA: 10/31/2019 PCP: Einar Pheasant, MD   Brief Narrative: Kyle Richardson is an 86 y.o. male with a history of CAD, CVA, T2DM, HTN, AFib on eliquis, glaucoma, dementia among others who presented to [facility] 1/30 with SOB in the setting of covid-19 infection and inability to stand/weakness, found to require HFNC in the ED, diarrhea, confusion. He was admitted, started on remdesivir, steroids, given tocilizumab and convalescent plasma 1/31 on transfer to [facility]. Initially required 55L HHF and despite improvement in hypoxemia down to 15L, prognosis remains poor and palliative care has been consulted.   Assessment & Plan: Active Problems:   Pneumonia due to COVID-19 virus   Pressure injury of skin  Acute hypoxemic respiratory failure due to covid-19 pneumonia: SARS-CoV-2 Ag positive on 1/30.  - Continue supplemental oxygen to maintain SpO2 >90% and normalize respiratory effort. CRP declining significantly 30.5 > 8.3 today. - Complete remdesivir x5 days today, continue steroids.  - Received tocilizumab and convalescent plasma 1/31.  - Vitamin C, zinc - Encourage OOB, IS, FV, and awake proning if able - Tylenol and antitussives prn - Continue airborne, contact precautions while admitted. Isolation period would be recommended for 21 days from positive testing. - Palliative care is consulted due to poor premorbid functional status, multiple severe comorbidities, and ongoing significant hypoxemia in patient who will be 86 years old on December 07, 2019.   Elevated d-dimer: Negative LE U/S noted.  - Continue eliquis  Diarrhea: POA, likely due to covid. Will not send for studies given no abd tenderness, distention, leukocytosis and alternative explanation.  - Ok to insert flexiseal due to sacral wound and inability to frequently toilet due to AMS/restlessness, hypoxemia.   Acute metabolic encephalopathy on chronic dementia: CT head nonacute. Likely  related to covid and acuity of illness. - Continue to minimize sedating medications for now  T2DM: Poorly controlled with hyperglycemia, HbA1c 8.7%.  - Continue basal-bolus insulin  HTN:  - Continue metoprolol  HLD:  - Continue statin  Generalized weakness: Progressively worse from covid on chronically poor functional status (hoyer lift-dependent) - PT/OT consulted, though may very well transition to comfort measures depending on clinical trajectory.   History of CVA: - Continue eliquis, statin.  PAF:  - Continue eliquis, metoprolol  CAD with demand myocardial ischemia:  - Continue AC, ASA, statin, prn NTG.  AKI:  - Improved  Hypernatremia:   Pressure injury stage I of sacrum, POA:  - Offload as able. - Ok to insert foley for short term to facilitate healing.  DVT prophylaxis: Eliquis Code Status: DNR Family Communication: Daughter called by palliative care, meeting set up 2/4 at 10:30am. Disposition Plan: From home, unclear depending on clinical trajectory and goals of care discussions. May pursue residential hospice placement.   Consultants:   Palliative care  Procedures:   None  Antimicrobials:  Remdesivir   Subjective: Tired and diffusely very weak, stable, constant shortness of breath at rest and with any minimal exertion. No chest pain or other complaints.   Objective: Vitals:   11/12/19 1425 11/12/19 1500 11/12/19 1531 11/12/19 1600  BP: 138/81 (!) 141/83 (!) 141/73 (!) 144/80  Pulse: 91 (!) 104 (!) 101 87  Resp: (!) 25 (!) 34 (!) 23 (!) 40  Temp:   99.8 F (37.7 C)   TempSrc:   Axillary   SpO2: 94% (!) 88% 90% 96%  Height:        Intake/Output Summary (Last 24 hours) at 11/12/2019 1655 Last data  filed at 11/12/2019 1405 Gross per 24 hour  Intake 0 ml  Output 600 ml  Net -600 ml   There were no vitals filed for this visit.  Gen: Frail elderly male in no acute distress Pulm: Non-labored breathing 15L. No wheezes.  CV: Regular rate and  rhythm. No murmur, rub, or gallop. No JVD, no pedal edema. GI: Abdomen soft, non-tender, non-distended, with normoactive bowel sounds. No organomegaly or masses felt. Ext: Warm, no deformities Skin: No rashes, lesions or ulcers on visualized skin. Neuro: Alert and not oriented. Not cooperative with exam. Psych: UTD  Data Reviewed: I have personally reviewed following labs and imaging studies  CBC: Recent Labs  Lab 11/08/19 1353 10/11/2019 0447 11/10/19 0305 11/11/19 0257 11/12/19 0220  WBC 7.4 9.1 8.6 8.7 9.5  NEUTROABS 6.0 8.1* 7.4 7.6 8.5*  HGB 15.8 14.1 14.6 13.5 15.4  HCT 48.8 42.5 45.3 41.5 46.5  MCV 97.2 96.8 98.9 99.0 97.1  PLT 181 190 216 217 [id number]   Basic Metabolic Panel: Recent Labs  Lab 11/08/19 1353 10/20/2019 0447 11/10/19 0305 11/11/19 0257 11/12/19 0220  NA 143 143 148* 147* 147*  K 4.2 4.0 4.3 4.0 4.0  CL 107 109 112* 113* 111  CO2 25 20* 24 24 23   GLUCOSE 151* 179* 187* 282* 202*  BUN 30* 30* 30* 34* 28*  CREATININE 1.37* 0.91 0.84 0.97 0.91  CALCIUM 8.7* 8.4* 8.8* 8.7* 8.9  MG  --  1.9 2.2 2.2 2.2  PHOS  --   --  2.3* 2.6 3.1   GFR: Estimated Creatinine Clearance: 60.4 mL/min (by C-G formula based on SCr of 0.91 mg/dL). Liver Function Tests: Recent Labs  Lab 11/08/19 1353 10/22/2019 0447 11/10/19 0305 11/11/19 0257 11/12/19 0220  AST 32 37 36 30 45*  ALT 18 20 20 20 25   ALKPHOS 63 60 67 71 106  BILITOT 0.8 0.8 0.4 0.5 0.9  PROT 7.3 6.3* 6.9 6.4* 7.0  ALBUMIN 3.1* 2.6* 2.8* 2.6* 3.0*   No results for input(s): LIPASE, AMYLASE in the last 168 hours. No results for input(s): AMMONIA in the last 168 hours. Coagulation Profile: No results for input(s): INR, PROTIME in the last 168 hours. Cardiac Enzymes: No results for input(s): CKTOTAL, CKMB, CKMBINDEX, TROPONINI in the last 168 hours. BNP (last 3 results) No results for input(s): PROBNP in the last 8760 hours. HbA1C: No results for input(s): HGBA1C in the last 72 hours. CBG: Recent Labs    Lab 11/11/19 2318 11/12/19 0327 11/12/19 0725 11/12/19 1128 11/12/19 1530  GLUCAP 182* 199* 170* 190* 122*   Lipid Profile: No results for input(s): CHOL, HDL, LDLCALC, TRIG, CHOLHDL, LDLDIRECT in the last 72 hours. Thyroid Function Tests: No results for input(s): TSH, T4TOTAL, FREET4, T3FREE, THYROIDAB in the last 72 hours. Anemia Panel: Recent Labs    11/11/19 0257 11/12/19 0220  FERRITIN 423* 478*   Urine analysis:    Component Value Date/Time   COLORURINE YELLOW (A) 11/05/2019 0436   APPEARANCEUR HAZY (A) 10/26/2019 0436   APPEARANCEUR Hazy 12/23/2012 0956   LABSPEC 1.035 (H) 11/02/2019 0436   LABSPEC 1.025 12/23/2012 0956   PHURINE 5.0 10/10/2019 0436   GLUCOSEU >=500 (A) 11/01/2019 0436   GLUCOSEU NEGATIVE 01/02/2019 1227   HGBUR NEGATIVE 10/14/2019 0436   BILIRUBINUR NEGATIVE 10/18/2019 0436   BILIRUBINUR postivie 1+ 01/28/2018 0824   BILIRUBINUR Negative 12/23/2012 0956   KETONESUR 20 (A) 10/23/2019 0436   PROTEINUR 100 (A) 10/15/2019 0436   UROBILINOGEN 0.2 01/02/2019 1227  NITRITE NEGATIVE 10/29/2019 0436   LEUKOCYTESUR NEGATIVE 11/01/2019 0436   LEUKOCYTESUR Negative 12/23/2012 0956   Recent Results (from the past 240 hour(s))  Blood Culture (routine x 2)     Status: None (Preliminary result)   Collection Time: 11/08/19  1:53 PM   Specimen: BLOOD  Result Value Ref Range Status   Specimen Description BLOOD R WRIST  Final   Special Requests   Final    BOTTLES DRAWN AEROBIC AND ANAEROBIC Blood Culture adequate volume   Culture   Final    NO GROWTH 4 DAYS Performed at [facility], [address], [place], [place] [address]    Report Status PENDING  Incomplete  Blood Culture (routine x 2)     Status: None (Preliminary result)   Collection Time: 11/08/19  1:53 PM   Specimen: BLOOD  Result Value Ref Range Status   Specimen Description BLOOD L WRIST  Final   Special Requests   Final    BOTTLES DRAWN AEROBIC AND ANAEROBIC Blood Culture  adequate volume   Culture   Final    NO GROWTH 4 DAYS Performed at [facility], [address] [place], [place] [address]    Report Status PENDING  Incomplete      Radiology Studies: VAS [place] LOWER EXTREMITY VENOUS (DVT)  Result Date: 11/11/2019  Lower Venous Study Indications: Elevated D dimer, Covid +.  Limitations: Poor ultrasound/tissue interface and patient position, cooperation, atherosclerotic shadowing. Performing Technologist: Antonieta Pert RDMS, RVT  Examination Guidelines: A complete evaluation includes B-mode imaging, spectral Doppler, color Doppler, and power Doppler as needed of all accessible portions of each vessel. Bilateral testing is considered an integral part of a complete examination. Limited examinations for reoccurring indications may be performed as noted.  +---------+---------------+---------+-----------+----------+-------------------+ RIGHT    CompressibilityPhasicitySpontaneityPropertiesThrombus Aging      +---------+---------------+---------+-----------+----------+-------------------+ CFV      Full           Yes      Yes                                      +---------+---------------+---------+-----------+----------+-------------------+ SFJ      Full                                                             +---------+---------------+---------+-----------+----------+-------------------+ FV Prox  Full                                                             +---------+---------------+---------+-----------+----------+-------------------+ FV Mid   Full                                                             +---------+---------------+---------+-----------+----------+-------------------+ FV DistalFull                                                             +---------+---------------+---------+-----------+----------+-------------------+  PFV      Full                                                              +---------+---------------+---------+-----------+----------+-------------------+ POP                     Yes      Yes                  unable to compress                                                        due to position     +---------+---------------+---------+-----------+----------+-------------------+ PTV                                                   limited                                                                   visualization       +---------+---------------+---------+-----------+----------+-------------------+ PERO                                                  limited                                                                   visualization       +---------+---------------+---------+-----------+----------+-------------------+ GSV      Full                                                             +---------+---------------+---------+-----------+----------+-------------------+   +---------+---------------+---------+-----------+----------+--------------+ LEFT     CompressibilityPhasicitySpontaneityPropertiesThrombus Aging +---------+---------------+---------+-----------+----------+--------------+ CFV      Full           Yes      Yes                                 +---------+---------------+---------+-----------+----------+--------------+ SFJ      Full                                                        +---------+---------------+---------+-----------+----------+--------------+  FV Prox  Full                                                        +---------+---------------+---------+-----------+----------+--------------+ FV Mid   Full                                                        +---------+---------------+---------+-----------+----------+--------------+ FV DistalFull                                                         +---------+---------------+---------+-----------+----------+--------------+ PFV      Full                                                        +---------+---------------+---------+-----------+----------+--------------+ POP      Full           Yes      Yes                                 +---------+---------------+---------+-----------+----------+--------------+ PTV      Full                                                        +---------+---------------+---------+-----------+----------+--------------+ PERO     Full                                                        +---------+---------------+---------+-----------+----------+--------------+ GSV      Full                                                        +---------+---------------+---------+-----------+----------+--------------+     Summary: Right: There is no evidence of deep vein thrombosis in the lower extremity. However, portions of this examination were limited- see technologist comments above. No cystic structure found in the popliteal fossa. Left: There is no evidence of deep vein thrombosis in the lower extremity. No cystic structure found in the popliteal fossa.  *See table(s) above for measurements and observations.    Preliminary     Scheduled Meds: . apixaban  5 mg Oral BID  . vitamin C  500 mg Oral Daily  . atorvastatin  40 mg Oral QHS  . insulin aspart  0-15 Units Subcutaneous Q4H  . insulin  detemir  7 Units Subcutaneous BID  . Ipratropium-Albuterol  1 puff Inhalation Q6H  . latanoprost  1 drop Both Eyes QHS  . linagliptin  5 mg Oral Daily  . Melatonin  10 mg Oral QHS  . methylPREDNISolone (SOLU-MEDROL) injection  45 mg Intravenous Q12H  . metoprolol succinate  12.5 mg Oral Daily  . traZODone  50 mg Oral QHS  . valproic acid  125 mg Oral Q24H   And  . valproic acid  187.5 mg Oral QHS  . zinc sulfate  220 mg Oral Daily   Continuous Infusions:   LOS: 3 days   Time spent: 25  minutes.  Patrecia Pour, MD [facility] [email or web address] 11/12/2019, 4:55 PM

## 2019-11-12 NOTE — Progress Notes (Signed)
Talked to daughter Jackelyn Poling, updated about unsafe and restless behavior, educated on haldol and that pt was now sleeping. She was voiced understanding and stated that they could facetime at another point in time. Also, stated that CM and palliative got intouch with her and answered all her questions. No concerns at this time

## 2019-11-12 NOTE — Progress Notes (Signed)
Stool sent to lab, waiting on MD to give order for CDiff

## 2019-11-12 NOTE — Progress Notes (Signed)
Messaged Dr Bonner Puna about diahrrea x2 for possible CDiff lab order, and inability to get accurate output because of urinary incontinence and external cath does not stay on patient's anatomy for possible foley, no orders given or message back

## 2019-11-12 NOTE — Progress Notes (Signed)
CSW spoke with patient's daughter. She had questions regarding POA paperwork for real estate for patient. CSW explained to her that the hospital is unable to assist with that type of legal document. She also asked if she had to do a Medicaid application right away since she is not feeling well and she does not feel like the patient is going to make it out of the hospital and she would like to speak with palliative care. CSW explained that Medicaid would be needed if [employer] does not approve him for a rehab stay, but that [employer] takes up to 90 days for approval so it would be a private pay cost until that came through. She reported understanding and will try to [facility] patient tonight.   Kyle Locus [name] LCSW [phone]

## 2019-11-12 NOTE — Progress Notes (Signed)
extrememly restless and confused, calling out for his "mama". Throwing his legs over siderail, taking his NRB off desats into 80s and sometimes 70s. Mittens in place, hand IV guarded, but pt still tried to pull at tubing. Haldol administered. Pt is now sleeping, vitals stable, O2 sat 90, resp 26

## 2019-11-12 NOTE — Progress Notes (Signed)
Updated wife and daughter. Daughter had questions about legal paperwaor, CM messaged to call her. Family also wants palliative team to contact them, paged palliative NP to call family

## 2019-11-13 ENCOUNTER — Inpatient Hospital Stay (HOSPITAL_COMMUNITY): Payer: PPO

## 2019-11-13 DIAGNOSIS — E876 Hypokalemia: Secondary | ICD-10-CM

## 2019-11-13 DIAGNOSIS — E870 Hyperosmolality and hypernatremia: Secondary | ICD-10-CM

## 2019-11-13 DIAGNOSIS — U071 COVID-19: Principal | ICD-10-CM

## 2019-11-13 DIAGNOSIS — I213 ST elevation (STEMI) myocardial infarction of unspecified site: Secondary | ICD-10-CM | POA: Diagnosis not present

## 2019-11-13 DIAGNOSIS — J1282 Pneumonia due to coronavirus disease 2019: Secondary | ICD-10-CM

## 2019-11-13 DIAGNOSIS — Z515 Encounter for palliative care: Secondary | ICD-10-CM

## 2019-11-13 DIAGNOSIS — Z66 Do not resuscitate: Secondary | ICD-10-CM

## 2019-11-13 DIAGNOSIS — Z7189 Other specified counseling: Secondary | ICD-10-CM

## 2019-11-13 LAB — CULTURE, BLOOD (ROUTINE X 2)
Culture: NO GROWTH
Culture: NO GROWTH
Special Requests: ADEQUATE
Special Requests: ADEQUATE

## 2019-11-13 LAB — CBC WITH DIFFERENTIAL/PLATELET
Abs Immature Granulocytes: 0.16 10*3/uL — ABNORMAL HIGH (ref 0.00–0.07)
Basophils Absolute: 0 10*3/uL (ref 0.0–0.1)
Basophils Relative: 0 %
Eosinophils Absolute: 0 10*3/uL (ref 0.0–0.5)
Eosinophils Relative: 0 %
HCT: 44.6 % (ref 39.0–52.0)
Hemoglobin: 15 g/dL (ref 13.0–17.0)
Immature Granulocytes: 1 %
Lymphocytes Relative: 7 %
Lymphs Abs: 0.9 10*3/uL (ref 0.7–4.0)
MCH: 32.5 pg (ref 26.0–34.0)
MCHC: 33.6 g/dL (ref 30.0–36.0)
MCV: 96.7 fL (ref 80.0–100.0)
Monocytes Absolute: 0.5 10*3/uL (ref 0.1–1.0)
Monocytes Relative: 4 %
Neutro Abs: 10.4 10*3/uL — ABNORMAL HIGH (ref 1.7–7.7)
Neutrophils Relative %: 88 %
Platelets: 185 10*3/uL (ref 150–400)
RBC: 4.61 MIL/uL (ref 4.22–5.81)
RDW: 12.9 % (ref 11.5–15.5)
WBC: 11.9 10*3/uL — ABNORMAL HIGH (ref 4.0–10.5)
nRBC: 0 % (ref 0.0–0.2)

## 2019-11-13 LAB — TROPONIN I (HIGH SENSITIVITY)
Troponin I (High Sensitivity): 3286 ng/L (ref <18)
Troponin I (High Sensitivity): 4394 ng/L (ref <18)
Troponin I (High Sensitivity): 7847 ng/L (ref <18)

## 2019-11-13 LAB — ECHOCARDIOGRAM COMPLETE
Height: 72 in
Weight: 3199.99 oz

## 2019-11-13 LAB — COMPREHENSIVE METABOLIC PANEL
ALT: 26 U/L (ref 0–44)
AST: 61 U/L — ABNORMAL HIGH (ref 15–41)
Albumin: 2.8 g/dL — ABNORMAL LOW (ref 3.5–5.0)
Alkaline Phosphatase: 104 U/L (ref 38–126)
Anion gap: 13 (ref 5–15)
BUN: 32 mg/dL — ABNORMAL HIGH (ref 8–23)
CO2: 22 mmol/L (ref 22–32)
Calcium: 8.7 mg/dL — ABNORMAL LOW (ref 8.9–10.3)
Chloride: 115 mmol/L — ABNORMAL HIGH (ref 98–111)
Creatinine, Ser: 1.15 mg/dL (ref 0.61–1.24)
GFR calc Af Amer: >60 mL/min (ref >60)
GFR calc non Af Amer: 56 mL/min — ABNORMAL LOW (ref >60)
Glucose, Bld: 168 mg/dL — ABNORMAL HIGH (ref 70–99)
Potassium: 3.4 mmol/L — ABNORMAL LOW (ref 3.5–5.1)
Sodium: 150 mmol/L — ABNORMAL HIGH (ref 135–145)
Total Bilirubin: 1.1 mg/dL (ref 0.3–1.2)
Total Protein: 6.3 g/dL — ABNORMAL LOW (ref 6.5–8.1)

## 2019-11-13 LAB — D-DIMER, QUANTITATIVE: D-Dimer, Quant: >20 ug/mL-FEU — ABNORMAL HIGH (ref 0.00–0.50)

## 2019-11-13 LAB — C-REACTIVE PROTEIN: CRP: 5 mg/dL — ABNORMAL HIGH (ref <1.0)

## 2019-11-13 LAB — GLUCOSE, CAPILLARY
Glucose-Capillary: 130 mg/dL — ABNORMAL HIGH (ref 70–99)
Glucose-Capillary: 114 mg/dL — ABNORMAL HIGH (ref 70–99)
Glucose-Capillary: 156 mg/dL — ABNORMAL HIGH (ref 70–99)

## 2019-11-13 LAB — PHOSPHORUS: Phosphorus: 3.4 mg/dL (ref 2.5–4.6)

## 2019-11-13 LAB — MAGNESIUM: Magnesium: 2.3 mg/dL (ref 1.7–2.4)

## 2019-11-13 LAB — FERRITIN: Ferritin: 536 ng/mL — ABNORMAL HIGH (ref 24–336)

## 2019-11-13 MED ORDER — GLYCOPYRROLATE 1 MG PO TABS: 1.0000 mg | ORAL_TABLET | ORAL | Status: DC | PRN

## 2019-11-13 MED ORDER — AMIODARONE HCL IN DEXTROSE 360-4.14 MG/200ML-% IV SOLN
30.0000 mg/h | INTRAVENOUS | Status: DC
  Filled 2019-11-13 (×2): qty 200

## 2019-11-13 MED ORDER — ENOXAPARIN SODIUM 100 MG/ML ~~LOC~~ SOLN
1.0000 mg/kg | Freq: Two times a day (BID) | SUBCUTANEOUS | Status: DC
  Filled 2019-11-13 (×2): qty 1

## 2019-11-13 MED ORDER — HALOPERIDOL LACTATE 5 MG/ML IJ SOLN
1.0000 mg | INTRAMUSCULAR | Status: DC | PRN
  Administered 2019-11-13: 17:00:00 1 mg via INTRAVENOUS
  Filled 2019-11-13 (×2): qty 1

## 2019-11-13 MED ORDER — ENOXAPARIN SODIUM 100 MG/ML ~~LOC~~ SOLN
90.0000 mg | SUBCUTANEOUS | Status: AC
  Administered 2019-11-13: 03:00:00 90 mg via SUBCUTANEOUS
  Filled 2019-11-13: qty 1

## 2019-11-13 MED ORDER — GLYCOPYRROLATE 0.2 MG/ML IJ SOLN: 0.2000 mg | INTRAMUSCULAR | Status: DC | PRN

## 2019-11-13 MED ORDER — MORPHINE SULFATE (PF) 2 MG/ML IV SOLN
2.0000 mg | INTRAVENOUS | Status: DC | PRN
  Administered 2019-11-13 – 2019-11-14 (×6): 2 mg via INTRAVENOUS
  Filled 2019-11-13 (×6): qty 1

## 2019-11-13 MED ORDER — ASPIRIN 300 MG RE SUPP
300.0000 mg | Freq: Every day | RECTAL | Status: DC
  Administered 2019-11-13: 02:00:00 300 mg via RECTAL
  Filled 2019-11-13: qty 1

## 2019-11-13 MED ORDER — LORAZEPAM 2 MG/ML PO CONC
1.0000 mg | ORAL | Status: DC | PRN
  Filled 2019-11-13: qty 0.5

## 2019-11-13 MED ORDER — HYDROXYZINE HCL 25 MG PO TABS
25.0000 mg | ORAL_TABLET | Freq: Three times a day (TID) | ORAL | Status: DC | PRN
  Filled 2019-11-13: qty 1

## 2019-11-13 MED ORDER — LORAZEPAM 2 MG/ML IJ SOLN
1.0000 mg | INTRAMUSCULAR | Status: DC | PRN
  Administered 2019-11-13 – 2019-11-14 (×2): 1 mg via INTRAVENOUS
  Filled 2019-11-13 (×2): qty 1

## 2019-11-13 MED ORDER — AMIODARONE LOAD VIA INFUSION
150.0000 mg | Freq: Once | INTRAVENOUS | Status: AC
  Administered 2019-11-13: 03:00:00 150 mg via INTRAVENOUS
  Filled 2019-11-13: qty 83.34

## 2019-11-13 MED ORDER — AMIODARONE HCL IN DEXTROSE 360-4.14 MG/200ML-% IV SOLN
60.0000 mg/h | INTRAVENOUS | Status: DC
  Administered 2019-11-13: 08:00:00 60 mg/h via INTRAVENOUS
  Filled 2019-11-13: qty 200

## 2019-11-13 MED ORDER — LORAZEPAM 1 MG PO TABS: 1.0000 mg | ORAL_TABLET | ORAL | Status: DC | PRN

## 2019-11-13 MED ORDER — ENOXAPARIN SODIUM 100 MG/ML ~~LOC~~ SOLN
1.0000 mg/kg | Freq: Two times a day (BID) | SUBCUTANEOUS | Status: DC
  Filled 2019-11-13: qty 1

## 2019-11-13 MED ORDER — ENOXAPARIN SODIUM 100 MG/ML ~~LOC~~ SOLN
1.0000 mg/kg | SUBCUTANEOUS | Status: DC
  Filled 2019-11-13: qty 1

## 2019-11-13 MED ORDER — BIOTENE DRY MOUTH MT LIQD
15.0000 mL | Freq: Two times a day (BID) | OROMUCOSAL | Status: DC
  Administered 2019-11-14: 15 mL via TOPICAL

## 2019-11-13 NOTE — Progress Notes (Signed)
Patient repositioned for comfort.

## 2019-11-13 NOTE — Progress Notes (Signed)
At the familles request, I was able to face time daughter. Wife and daughter were given time to express their love and grief.

## 2019-11-13 NOTE — Progress Notes (Signed)
Patient seen with facial grimace, HR Afib 99-120. PRN medication given

## 2019-11-13 NOTE — Progress Notes (Signed)
  Echocardiogram 2D Echocardiogram has been performed.  Kyle Richardson 11/13/2019, 12:45 PM

## 2019-11-13 NOTE — Progress Notes (Signed)
Spoke with MD in regards to labs and treatment plan. Advised to continue current treatment and keep patient comfortable. Meeting with family is scheduled for this AM.

## 2019-11-13 NOTE — Progress Notes (Addendum)
0300 Patient had a critical troponin result 32, physician ordered amiodarone drip. Physician suspected Stemi. Oral medications also Discontinued r/t aspiration precaution.   Second EKG performed, no change, Acute MI reported  05:00 patient experiencing occasional multifocal PVC's   06:00 second critical troponin result 43 reported, physician paged. Information transferred in report to nurse  During shift change, including low potasium 3.4.

## 2019-11-13 NOTE — Consult Note (Signed)
Consultation Note Date: 11/13/2019   Patient Name: Kyle Richardson  DOB: 1928/08/22  MRN: [id number]  Age / Sex: 86 y.o., male  PCP: Einar Pheasant, MD Referring Physician: Patrecia Pour, MD  Reason for Consultation: Establishing goals of care  HPI/Patient Profile: 86 y.o. male  with past medical history of CAD, CVA, DM, HTN, a fib, HOH, glaucoma, and advanced dementia admitted on 10/18/2019 with shortness of breath and weakness in setting of COVID-19 infection. Patient requiring high amounts of oxygen. Unable to tolerate PO intake. On the morning of 2/4, converted to a fib RVR and had elevated troponin. PMT consulted for [place].   Clinical Assessment and Goals of Care: I have reviewed medical records including EPIC notes, labs and imaging, received report from RN and Dr. Bonner Puna, assessed the patient and then spoke with patient's wife and daughter, Jackelyn Poling,  to discuss diagnosis prognosis, [place], EOL wishes, disposition and options.  I introduced Palliative Medicine as specialized medical care for people living with serious illness. It focuses on providing relief from the symptoms and stress of a serious illness. The goal is to improve quality of life for both the patient and the family.  As far as functional and nutritional status, they shares that patient was non-ambulatory baseline. He was pleasant and would converse some but significantly impacted by advanced dementia. He did typically have agitation in the evening.   We discussed his current illness and what it means in the larger context of his on-going co-morbidities.  Natural disease trajectory and expectations at EOL were discussed. Specifically discussed that Mr. Israelson appears to be nearing end of life even with continued medical therapies.  The difference between aggressive medical intervention and comfort care was considered in light of the patient's goals of care. Provided a detailed description of  what focusing on Mr. Chirinos care would like to transition to full comfort care.     We discussed that patient was too frail for transfer out of the hospital. Discussed visitation policy to see Mr. Crisantos.   Family was given time to consider decisions and discuss with other family members - they called back with their decision for full comfort care for Mr. Nicholes.   Questions and concerns were addressed. The family was encouraged to call with questions or concerns.   Primary Decision Maker NEXT OF KIN - spouse Greer Gabehart joined by daughter, Jackelyn Poling   SUMMARY OF RECOMMENDATIONS   - transition to full comfort care, PRN comfort medications added, remove any measures that increase discomfort and do not contribute to comfort - family not planning to visit d/t COVID concerns, will facetime  Code Status/Advance Care Planning:  DNR   Symptom Management:   Increase frequency of PRN morphine and PRN haldol  Added lorazepam and robinul  Palliative Prophylaxis:   Delirium Protocol, Frequent Pain Assessment and Turn Reposition  Additional Recommendations (Limitations, Scope, Preferences):  Full Comfort Care  Prognosis:   Hours - Days  Discharge Planning: Anticipated Hospital Death      Primary Diagnoses: Present on Admission: . Pneumonia due to COVID-19 virus   I have reviewed the medical record, interviewed the patient and family, and examined the patient. The following aspects are pertinent.  Past Medical History:  Diagnosis Date  . Abdominal fibromatosis   . Atrial fibrillation ([place])   . 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  s/p PTCA of the mid RCA 1/96, also  s/p  CABG x 4 7/96  .  Complication of anesthesia    confusion and hallucinations for several days after receiving Versed  . Degenerative disc disease   . Dementia ([place])   . Diabetes mellitus ([place])   . Glaucoma   . Hypercholesterolemia   . Hypertension   . Migraine headache     history of  . Myocardial infarction ([place]) 1996  . Osteoarthritis   . Sleep apnea   . Stroke ([place]) 02/22/2017   affected left side    Social History   Socioeconomic History  . Marital status: Married    Spouse name: [name]  . Number of children: 2  . Years of education: come college  . Highest education level: Associate degree: occupational, Hotel manager, or vocational program  Occupational History  . Occupation: retired    Fish farm manager: [employer]  Tobacco Use  . Smoking status: Never Smoker  . Smokeless tobacco: Never Used  Substance and Sexual Activity  . Alcohol use: No    Alcohol/week: 0.0 standard drinks  . Drug use: No  . Sexual activity: Not Currently  Other Topics Concern  . Not on file  Social History Narrative  . Not on file   Social Determinants of Health   Financial Resource Strain:   . Difficulty of Paying Living Expenses: Not on file  Food Insecurity:   . Worried About Charity fundraiser in the Last Year: Not on file  . Ran Out of Food in the Last Year: Not on file  Transportation Needs:   . Lack of Transportation (Medical): Not on file  . Lack of Transportation (Non-Medical): Not on file  Physical Activity:   . Days of Exercise per Week: Not on file  . Minutes of Exercise per Session: Not on file  Stress:   . Feeling of Stress : Not on file  Social Connections:   . Frequency of Communication with Friends and Family: Not on file  . Frequency of Social Gatherings with Friends and Family: Not on file  . Attends Religious Services: Not on file  . Active Member of Clubs or Organizations: Not on file  . Attends Archivist Meetings: Not on file  . Marital Status: Not on file   Family History  Problem Relation Age of Onset  . Heart disease Mother        died age 40  . Heart disease Father        and other vascular disease  . Diabetes Other        four siblings  . Heart disease Brother        s/p CABG  . Colon cancer Neg Hx   . Prostate cancer Neg  Hx    Scheduled Meds: . vitamin C  500 mg Oral Daily  . aspirin  300 mg Rectal Daily  . atorvastatin  40 mg Oral QHS  . Chlorhexidine Gluconate Cloth  6 each Topical Daily  . enoxaparin (LOVENOX) injection  1 mg/kg Subcutaneous Q12H  . insulin aspart  0-15 Units Subcutaneous Q4H  . insulin detemir  7 Units Subcutaneous BID  . Ipratropium-Albuterol  1 puff Inhalation Q6H  . latanoprost  1 drop Both Eyes QHS  . linagliptin  5 mg Oral Daily  . Melatonin  10 mg Oral QHS  . methylPREDNISolone (SOLU-MEDROL) injection  45 mg Intravenous Q12H  . metoprolol succinate  12.5 mg Oral Daily  . traZODone  50 mg Oral QHS  . valproic acid  125 mg Oral Q24H   And  . valproic acid  187.5 mg Oral QHS  . zinc sulfate  220 mg Oral Daily   Continuous Infusions: . amiodarone 30 mg/hr (11/13/19 0918)   PRN Meds:.albuterol, haloperidol lactate, hydrOXYzine, metoprolol tartrate, morphine injection Allergies  Allergen Reactions  . Latex Swelling  . Fentanyl Other (See Comments)    Reaction: confusion  . Flomax [Tamsulosin Hcl] Other (See Comments)    Unknown  . Lamisil [Terbinafine] Other (See Comments)    Unknown  . Levofloxacin Other (See Comments)    Diplopia. NEVER put patient on levoquin!  . Lorazepam Other (See Comments)    Reaction: confusion/ hallucinations   . Metformin And Related Diarrhea  . Sertraline Other (See Comments)    Unknown  . Sulfa Antibiotics Other (See Comments)    Unknown  . Versed [Midazolam] Other (See Comments)    Hallucinations and confusion  . Clarithromycin Diarrhea  . Codeine Other (See Comments)    Unknown    Vital Signs: BP 112/64 (BP Location: Left Arm)   Pulse (!) 53   Temp (!) 97.5 F (36.4 C) (Oral)   Resp 13   Ht 6' (1.829 m)   Wt 90.7 kg   SpO2 92%   BMI 27.12 kg/m  Pain Scale: Faces POSS *See Group Information*: 1-Acceptable,Awake and alert Pain Score: 0-No pain   SpO2: SpO2: 92 % O2 Device:SpO2: 92 % O2 Flow Rate: .O2 Flow Rate  (L/min): 15 L/min  IO: Intake/output summary:   Intake/Output Summary (Last 24 hours) at 11/13/2019 1123 Last data filed at 11/13/2019 0600 Gross per 24 hour  Intake 91.02 ml  Output 300 ml  Net -208.98 ml    LBM: Last BM Date: 11/12/19 Baseline Weight: Weight: 90.7 kg Most recent weight: Weight: 90.7 kg     Palliative Assessment/Data: PPS 10%   Flowsheet Rows     Most Recent Value  Intake Tab  Referral Department  Hospitalist  Unit at Time of Referral  Intermediate Care Unit  Palliative Care Primary Diagnosis  Sepsis/Infectious Disease  Date Notified  11/11/19  Palliative Care Type  New Palliative care  Reason for referral  Clarify Goals of Care  Date of Admission  10/29/2019  Date first seen by Palliative Care  11/12/19  # of days Palliative referral response time  1 Day(s)  # of days IP prior to Palliative referral  2  Clinical Assessment  Palliative Performance Scale Score  10%  Psychosocial & Spiritual Assessment  Palliative Care Outcomes     The above conversation was completed via telephone due to the visitor restrictions during the COVID-19 pandemic. Thorough chart review and discussion with necessary members of the care team was completed as part of assessment. All issues were discussed and addressed but no physical exam was performed.  Time Total: 70 minutes Greater than 50%  of this time was spent counseling and coordinating care related to the above assessment and plan.  Juel Burrow, DNP, AGNP-C Palliative Medicine Team [phone] Pager: [phone]

## 2019-11-13 NOTE — Progress Notes (Signed)
PROGRESS NOTE  Kyle Richardson  [id number] DOB: [birth year] DOA: 10/22/2019 PCP: Einar Pheasant, MD   Brief Narrative: Kyle Richardson is an 86 y.o. male with a history of CAD, CVA, T2DM, HTN, AFib on eliquis, glaucoma, dementia among others who presented to [facility] 1/30 with SOB in the setting of covid-19 infection and inability to stand/weakness, found to require HFNC in the ED, diarrhea, confusion. He was admitted, started on remdesivir, steroids, given tocilizumab and convalescent plasma 1/31 on transfer to [facility]. Initially required 55L HHF which was weaned with treatment to 15L NRB. He remained weak with minimal improvement in mental status. Palliative care was consulted, confirmed DNR status which was present on admission to [facility]. Aggressive interventions were continued for respiratory failure, acute encephalopathy, and generalized weakness. On 2/3 the patient's rhythm was noted to have converted to AFib with rapid ventricular response, subsequently confirmed by ECG (showing new ST elevations in II, III, AVF and reciprocal changes) and troponin elevation (from 27 previously to 3k, then up to 7k) to have STEMI with inferior basal akinesis on echocardiogram. Anticoagulation was continued, amiodarone infusion started, though the patient's hypoxemia continued to worsen. With continued goals of care discussions in light of very poor prognosis, the family has decided on comfort measures.  Assessment & Plan: Active Problems:   Pneumonia due to COVID-19 virus   Pressure injury of skin  Acute hypoxemic respiratory failure due to covid-19 pneumonia: SARS-CoV-2 Ag positive on 1/30.  - Give morphine prn dyspnea, continue oxygen. - Completed remdesivir x5 days, continue steroids.  - Received tocilizumab and convalescent plasma 1/31.  - Tylenol and antitussives prn - Continue airborne, contact precautions while admitted  Elevated d-dimer: Negative LE U/S noted, then d-dimer precipitously rose on 2/4 in  conjunction with AFib with RVR, troponin elevation, and ischemic ECG changes. This occurred despite therapeutic anticoagulation.  - Can convert away from anticoagulation with comfort measures in place.  Inferior acute STEMI on chronic CAD: Supportive care. Patient is not candidate for interventions due to acuity of illness, poor prognosis despite potential interventions.  Diarrhea: POA, likely due to covid. Will not send for studies given no abd tenderness, distention, leukocytosis and alternative explanation.  - Ok to insert flexiseal   Acute metabolic encephalopathy on chronic dementia: CT head nonacute. Likely related to covid and acuity of illness. Supportive care.  T2DM: Poorly controlled with hyperglycemia, HbA1c 8.7%. Supportive care.  HTN: No interventions planned.  HLD: No interventions planned  Generalized weakness: Progressively worse from covid on chronically poor functional status (hoyer lift-dependent premorbid condition). Reposition for comfort.  History of CVA: No interventions planned.  PAF: No further interventions planned.  AKI: Improved.  Hypernatremia: Worsening  Pressure injury stage I of sacrum, POA:  - Offload as able. - Ok to insert foley   DVT prophylaxis: None Code Status: DNR Family Communication: Family spoke with palliative care (see notes) multiple times daily. Disposition Plan: Do not feel this patient is stable for transport. Anticipate inpatient death.  Consultants:   Palliative care  Procedures:   Echocardiogram 2/4: Inferior basal akinesis, LVEF preserved, RV normal size and function.  Antimicrobials:  Remdesivir   Subjective: Nonverbal, grimacing intermittently this morning, appeared to be struggling to breathe.  Objective: Vitals:   11/13/19 0724 11/13/19 0829 11/13/19 0942 11/13/19 1100  BP: 106/65 106/65 103/81 112/64  Pulse:  70 (!) 40 (!) 53  Resp:  (!) 21 (!) 21 13  Temp: 98.9 F (37.2 C) 98.9 F (37.2 C)  (!) 97.5  F (  36.4 C)  TempSrc: Axillary Axillary  Oral  SpO2:   94% 92%  Weight:  90.7 kg    Height:  6' (1.829 m)      Intake/Output Summary (Last 24 hours) at 11/13/2019 1147 Last data filed at 11/13/2019 0600 Gross per 24 hour  Intake 91.02 ml  Output 300 ml  Net -208.98 ml   Filed Weights   11/13/19 0829  Weight: 90.7 kg   Gen: Frail, elderly male in no distress Pulm: Labored tachypnea despite 15L NRB + 15L HFNC. Crackles diffusely. CV: Irreg irreg, no new murmur, gallop, JVD or edema GI: Abdomen soft, non-tender, non-distended, with normoactive bowel sounds.  Ext: Warm, dry. Skin: No new  rashes, lesions or ulcers on visualized skin. Neuro: Rouses but nonverbal, not cooperative with exam. Psych: UTD.  Data Reviewed: I have personally reviewed following labs and imaging studies  CBC: Recent Labs  Lab 10/21/2019 0447 11/10/19 0305 11/11/19 0257 11/12/19 0220 11/12/19 2354  WBC 9.1 8.6 8.7 9.5 11.9*  NEUTROABS 8.1* 7.4 7.6 8.5* 10.4*  HGB 14.1 14.6 13.5 15.4 15.0  HCT 42.5 45.3 41.5 46.5 44.6  MCV 96.8 98.9 99.0 97.1 96.7  PLT 190 216 217 228 [id number]   Basic Metabolic Panel: Recent Labs  Lab 11/07/2019 0447 11/10/19 0305 11/11/19 0257 11/12/19 0220 11/12/19 2354  NA 143 148* 147* 147* 150*  K 4.0 4.3 4.0 4.0 3.4*  CL 109 112* 113* 111 115*  CO2 20* 24 24 23 22   GLUCOSE 179* 187* 282* 202* 168*  BUN 30* 30* 34* 28* 32*  CREATININE 0.91 0.84 0.97 0.91 1.15  CALCIUM 8.4* 8.8* 8.7* 8.9 8.7*  MG 1.9 2.2 2.2 2.2 2.3  PHOS  --  2.3* 2.6 3.1 3.4   GFR: Estimated Creatinine Clearance: 47.8 mL/min (by C-G formula based on SCr of 1.15 mg/dL). Liver Function Tests: Recent Labs  Lab 10/18/2019 0447 11/10/19 0305 11/11/19 0257 11/12/19 0220 11/12/19 2354  AST 37 36 30 45* 61*  ALT 20 20 20 25 26   ALKPHOS 60 67 71 106 104  BILITOT 0.8 0.4 0.5 0.9 1.1  PROT 6.3* 6.9 6.4* 7.0 6.3*  ALBUMIN 2.6* 2.8* 2.6* 3.0* 2.8*   No results for input(s): LIPASE, AMYLASE in the last 168  hours. No results for input(s): AMMONIA in the last 168 hours. Coagulation Profile: No results for input(s): INR, PROTIME in the last 168 hours. Cardiac Enzymes: No results for input(s): CKTOTAL, CKMB, CKMBINDEX, TROPONINI in the last 168 hours. BNP (last 3 results) No results for input(s): PROBNP in the last 8760 hours. HbA1C: No results for input(s): HGBA1C in the last 72 hours. CBG: Recent Labs  Lab 11/12/19 2056 11/12/19 2337 11/13/19 0342 11/13/19 0717 11/13/19 1116  GLUCAP 164* 167* 130* 114* 156*   Lipid Profile: No results for input(s): CHOL, HDL, LDLCALC, TRIG, CHOLHDL, LDLDIRECT in the last 72 hours. Thyroid Function Tests: No results for input(s): TSH, T4TOTAL, FREET4, T3FREE, THYROIDAB in the last 72 hours. Anemia Panel: Recent Labs    11/12/19 0220 11/12/19 2354  FERRITIN 478* 536*   Urine analysis:    Component Value Date/Time   COLORURINE YELLOW (A) 10/28/2019 0436   APPEARANCEUR HAZY (A) 11/01/2019 0436   APPEARANCEUR Hazy 12/23/2012 0956   LABSPEC 1.035 (H) 10/15/2019 0436   LABSPEC 1.025 12/23/2012 0956   PHURINE 5.0 10/29/2019 0436   GLUCOSEU >=500 (A) 10/28/2019 0436   GLUCOSEU NEGATIVE 01/02/2019 [place] 10/21/2019 0436   BILIRUBINUR NEGATIVE 10/17/2019 0436  BILIRUBINUR postivie 1+ 01/28/2018 0824   BILIRUBINUR Negative 12/23/2012 0956   KETONESUR 20 (A) 11/06/2019 0436   PROTEINUR 100 (A) 10/22/2019 0436   UROBILINOGEN 0.2 01/02/2019 1227   NITRITE NEGATIVE 10/21/2019 0436   LEUKOCYTESUR NEGATIVE 10/10/2019 0436   LEUKOCYTESUR Negative 12/23/2012 0956   Recent Results (from the past 240 hour(s))  Blood Culture (routine x 2)     Status: None   Collection Time: 11/08/19  1:53 PM   Specimen: BLOOD  Result Value Ref Range Status   Specimen Description BLOOD R WRIST  Final   Special Requests   Final    BOTTLES DRAWN AEROBIC AND ANAEROBIC Blood Culture adequate volume   Culture   Final    NO GROWTH 5 DAYS Performed at  [facility], [address], [place], [place] [address]    Report Status 11/13/2019 FINAL  Final  Blood Culture (routine x 2)     Status: None   Collection Time: 11/08/19  1:53 PM   Specimen: BLOOD  Result Value Ref Range Status   Specimen Description BLOOD L WRIST  Final   Special Requests   Final    BOTTLES DRAWN AEROBIC AND ANAEROBIC Blood Culture adequate volume   Culture   Final    NO GROWTH 5 DAYS Performed at [facility], [address], [place], [place] [address]    Report Status 11/13/2019 FINAL  Final      Radiology Studies: VAS [place] LOWER EXTREMITY VENOUS (DVT)  Result Date: 11/12/2019  Lower Venous Study Indications: Elevated D dimer, Covid +.  Limitations: Poor ultrasound/tissue interface and patient position, cooperation, atherosclerotic shadowing. Performing Technologist: Antonieta Pert RDMS, RVT  Examination Guidelines: A complete evaluation includes B-mode imaging, spectral Doppler, color Doppler, and power Doppler as needed of all accessible portions of each vessel. Bilateral testing is considered an integral part of a complete examination. Limited examinations for reoccurring indications may be performed as noted.  +---------+---------------+---------+-----------+----------+-------------------+ RIGHT    CompressibilityPhasicitySpontaneityPropertiesThrombus Aging      +---------+---------------+---------+-----------+----------+-------------------+ CFV      Full           Yes      Yes                                      +---------+---------------+---------+-----------+----------+-------------------+ SFJ      Full                                                             +---------+---------------+---------+-----------+----------+-------------------+ FV Prox  Full                                                             +---------+---------------+---------+-----------+----------+-------------------+ FV Mid   Full                                                              +---------+---------------+---------+-----------+----------+-------------------+  FV DistalFull                                                             +---------+---------------+---------+-----------+----------+-------------------+ PFV      Full                                                             +---------+---------------+---------+-----------+----------+-------------------+ POP                     Yes      Yes                  unable to compress                                                        due to position     +---------+---------------+---------+-----------+----------+-------------------+ PTV                                                   limited                                                                   visualization       +---------+---------------+---------+-----------+----------+-------------------+ PERO                                                  limited                                                                   visualization       +---------+---------------+---------+-----------+----------+-------------------+ GSV      Full                                                             +---------+---------------+---------+-----------+----------+-------------------+   +---------+---------------+---------+-----------+----------+--------------+ LEFT     CompressibilityPhasicitySpontaneityPropertiesThrombus Aging +---------+---------------+---------+-----------+----------+--------------+ CFV      Full           Yes      Yes                                 +---------+---------------+---------+-----------+----------+--------------+  SFJ      Full                                                        +---------+---------------+---------+-----------+----------+--------------+ FV Prox  Full                                                         +---------+---------------+---------+-----------+----------+--------------+ FV Mid   Full                                                        +---------+---------------+---------+-----------+----------+--------------+ FV DistalFull                                                        +---------+---------------+---------+-----------+----------+--------------+ PFV      Full                                                        +---------+---------------+---------+-----------+----------+--------------+ POP      Full           Yes      Yes                                 +---------+---------------+---------+-----------+----------+--------------+ PTV      Full                                                        +---------+---------------+---------+-----------+----------+--------------+ PERO     Full                                                        +---------+---------------+---------+-----------+----------+--------------+ GSV      Full                                                        +---------+---------------+---------+-----------+----------+--------------+     Summary: Right: - There is no evidence of deep vein thrombosis in the lower extremity. However, portions of this examination were limited- see technologist comments above. No cystic structure found in the popliteal fossa. Left: No cystic structure found in the popliteal fossa.  *See  table(s) above for measurements and observations. Electronically signed by Curt Jews MD on 11/12/2019 at 5:14:20 PM.    Final     Scheduled Meds: . vitamin C  500 mg Oral Daily  . aspirin  300 mg Rectal Daily  . atorvastatin  40 mg Oral QHS  . Chlorhexidine Gluconate Cloth  6 each Topical Daily  . enoxaparin (LOVENOX) injection  1 mg/kg Subcutaneous Q12H  . insulin aspart  0-15 Units Subcutaneous Q4H  . insulin detemir  7 Units Subcutaneous BID  . Ipratropium-Albuterol  1 puff Inhalation Q6H  .  latanoprost  1 drop Both Eyes QHS  . linagliptin  5 mg Oral Daily  . Melatonin  10 mg Oral QHS  . methylPREDNISolone (SOLU-MEDROL) injection  45 mg Intravenous Q12H  . metoprolol succinate  12.5 mg Oral Daily  . traZODone  50 mg Oral QHS  . valproic acid  125 mg Oral Q24H   And  . valproic acid  187.5 mg Oral QHS  . zinc sulfate  220 mg Oral Daily   Continuous Infusions: . amiodarone 30 mg/hr (11/13/19 0918)     LOS: 4 days   Time spent: 35 minutes.  Patrecia Pour, MD [facility] [email or web address] 11/13/2019, 11:47 AM

## 2019-12-08 NOTE — Death Summary Note (Signed)
DEATH SUMMARY   Patient Details  Name: Kyle Richardson MRN: [id number] DOB: [birth year]  Admission/Discharge Information   Admit Date:  Dec 02, 2019  Date of Death: Date of Death: 12/07/19  Time of Death: Time of Death: 0453  Length of Stay: 5  Referring Physician: Einar Pheasant, MD   Reason(s) for Hospitalization  Respiratory failure  Diagnoses  Preliminary cause of death:  Secondary Diagnoses (including complications and co-morbidities):  Principal Problem:   Acute hypoxemic respiratory failure due to severe acute respiratory syndrome coronavirus 2 (SARS-CoV-2) disease ([place]) Active Problems:   Hypertension   Hypercholesterolemia   CAD (coronary artery disease)   PAF (paroxysmal atrial fibrillation) (HCC)   History of CVA (cerebrovascular accident)   Diabetes mellitus type 2 in nonobese ([place])   Acute metabolic encephalopathy   Diarrhea   AKI (acute kidney injury) ([place])   Dementia ([place])   Elevated troponin   Pneumonia due to COVID-19 virus   Pressure injury of skin   Comfort measures only status   Goals of care, counseling/discussion   Palliative care by specialist   DNR (do not resuscitate)   STEMI (ST elevation myocardial infarction) ([place])   Hypokalemia   Hypernatremia   Brief Hospital Course (including significant findings, care, treatment, and services provided and events leading to death)  Kyle Richardson is a 86 y.o. year old male with a history of CAD, CVA, T2DM, HTN, AFib on eliquis, glaucoma, dementia among others who presented to [facility] 1/30 with SOB in the setting of COVID-19 infection and inability to stand/weakness, found to require HFNC in the ED, diarrhea, confusion. He was admitted, started on remdesivir, steroids, given tocilizumab and convalescent plasma 12-01-2022 on transfer to [facility]. Initially required 55L HHF which was weaned with treatment to 15L NRB. He remained weak with minimal improvement in mental status. Palliative care was consulted, confirmed DNR status  which was present on admission to [facility]. Aggressive interventions were continued for respiratory failure, acute encephalopathy, and generalized weakness. On 2/3 the patient's rhythm was noted to have converted to AFib with rapid ventricular response, subsequently confirmed by ECG (showing new ST elevations in II, III, AVF and reciprocal changes) and troponin elevation (from 27 previously to 3k, then up to 7k) to have STEMI with inferior basal akinesis on echocardiogram. Anticoagulation was continued, amiodarone infusion started, though the patient's hypoxemia continued to worsen. With continued goals of care discussions in light of very poor prognosis, the family decided on comfort measures.  Morphine was provided with good control of dyspnea/pain and he ultimately died on his birthday 07-Dec-2017 at 04:53.  Pertinent Labs and Studies  Significant Diagnostic Studies CT HEAD WO CONTRAST  Result Date: 02-Dec-2019 CLINICAL DATA:  Encephalopathy. EXAM: CT HEAD WITHOUT CONTRAST TECHNIQUE: Contiguous axial images were obtained from the base of the skull through the vertex without intravenous contrast. COMPARISON:  Head CT 07/28/2019 FINDINGS: Brain: No intracranial hemorrhage, mass effect, or midline shift. Stable degree of generalized atrophy. No hydrocephalus. The basilar cisterns are patent. Chronic bilateral occipital infarcts, unchanged from prior. Chronic left thalamic lacunar infarct. Stable background chronic small vessel ischemia. No evidence of territorial infarct or acute ischemia. No extra-axial or intracranial fluid collection. Vascular: Atherosclerosis of skullbase vasculature without hyperdense vessel or abnormal calcification. Skull: No fracture or focal lesion. Sinuses/Orbits: Chronic fluid level in left side of sphenoid sinus. Remaining paranasal sinuses are clear. Chronic opacification of posterior lower right mastoid air cells. Other: None. IMPRESSION: 1. No acute intracranial abnormality. 2. Unchanged  atrophy, chronic small vessel ischemia, and  remote occipital infarcts. Electronically Signed   By: Keith Rake M.D.   On: 11/08/2019 01:07   DG Chest Port 1 View  Result Date: 11/08/2019 CLINICAL DATA:  Worsening shortness of breath. COVID-19 positive. Hypertension. Coronary artery disease. EXAM: PORTABLE CHEST 1 VIEW COMPARISON:  07/31/2019 FINDINGS: Moderate right hemidiaphragm elevation. Prior median sternotomy. Midline trachea. Normal heart size. Atherosclerosis in the transverse aorta. No pleural effusion or pneumothorax. Diffuse interstitial opacities, superimposed upon chronic interstitial thickening. Relative sparing of the right lung base and left apex. IMPRESSION: Multifocal bilateral interstitial opacities, suspicious for COVID-19 pneumonia. Congestive heart failure could look similar, but would be expected to have cardiomegaly and pleural fluid. Aortic Atherosclerosis (ICD10-I70.0). Electronically Signed   By: Abigail Miyamoto M.D.   On: 11/08/2019 14:07   ECHOCARDIOGRAM COMPLETE  Result Date: 11/13/2019   ECHOCARDIOGRAM REPORT   Patient Name:   [facility] Date of Exam: 11/13/2019 Medical Rec #:  [id number]      Height:       72.0 in Accession #:    [id number]     Weight:       200.0 lb Date of Birth:  [birth year]       BSA:          2.13 m Patient Age:    42 years       BP:           112/64 mmHg Patient Gender: M              HR:           53 bpm. Exam Location:  Inpatient Procedure: 2D Echo, Cardiac Doppler and Color Doppler Indications:    Acute myocardial infarction 410  History:        Patient has prior history of Echocardiogram examinations, most                 recent 10/11/2018. CAD, TIA and Stroke; Risk Factors:Hypertension,                 Dyslipidemia and Non-Smoker. Elevated troponin. AAA.  Sonographer:    Vickie Epley RDCS Referring Phys: [id number] Tishina Lown  Sonographer Comments: GV Covid positive. IMPRESSIONS  1. Left ventricular ejection fraction, by visual estimation, is 55 to 60%.  The left ventricle has normal function. There is mildly increased left ventricular hypertrophy.  2. Left ventricular diastolic parameters are indeterminate.  3. The left ventricle demonstrates regional wall motion abnormalities.  4. Inferior bassal akinesis.  5. Global right ventricle has normal systolic function.The right ventricular size is normal. No increase in right ventricular wall thickness.  6. Left atrial size was normal.  7. Right atrial size was normal.  8. The mitral valve is normal in structure. Trivial mitral valve regurgitation. No evidence of mitral stenosis.  9. The tricuspid valve is normal in structure. 10. The tricuspid valve is normal in structure. Tricuspid valve regurgitation is not demonstrated. 11. The aortic valve is normal in structure. Aortic valve regurgitation is trivial. Mild to moderate aortic valve sclerosis/calcification without any evidence of aortic stenosis. 12. The pulmonic valve was normal in structure. Pulmonic valve regurgitation is not visualized. 13. Normal pulmonary artery systolic pressure. 14. The inferior vena cava is normal in size with greater than 50% respiratory variability, suggesting right atrial pressure of 3 mmHg. 15. The interatrial septum was not well visualized. FINDINGS  Left Ventricle: Left ventricular ejection fraction, by visual estimation, is 55 to 60%. The left ventricle has normal function. The left ventricle  demonstrates regional wall motion abnormalities. There is mildly increased left ventricular hypertrophy. Left ventricular diastolic parameters are indeterminate. Normal left atrial pressure. Inferior bassal akinesis. Right Ventricle: The right ventricular size is normal. No increase in right ventricular wall thickness. Global RV systolic function is has normal systolic function. The tricuspid regurgitant velocity is 1.80 m/s, and with an assumed right atrial pressure  of 8 mmHg, the estimated right ventricular systolic pressure is normal at 21.0  mmHg. Left Atrium: Left atrial size was normal in size. Right Atrium: Right atrial size was normal in size Pericardium: There is no evidence of pericardial effusion. Mitral Valve: The mitral valve is normal in structure. There is mild thickening of the mitral valve leaflet(s). Trivial mitral valve regurgitation. No evidence of mitral valve stenosis by observation. Tricuspid Valve: The tricuspid valve is normal in structure. Tricuspid valve regurgitation is not demonstrated. Aortic Valve: The aortic valve is normal in structure. Aortic valve regurgitation is trivial. Aortic regurgitation PHT measures 807 msec. Mild to moderate aortic valve sclerosis/calcification is present, without any evidence of aortic stenosis. Pulmonic Valve: The pulmonic valve was normal in structure. Pulmonic valve regurgitation is not visualized. Pulmonic regurgitation is not visualized. Aorta: The aortic root, ascending aorta and aortic arch are all structurally normal, with no evidence of dilitation or obstruction. Venous: The inferior vena cava is normal in size with greater than 50% respiratory variability, suggesting right atrial pressure of 3 mmHg. IAS/Shunts: The interatrial septum was not well visualized. There is no evidence of a patent foramen ovale. No ventricular septal defect is seen or detected. There is no evidence of an atrial septal defect.  LEFT VENTRICLE PLAX 2D LVIDd:         4.05 cm LVIDs:         3.63 cm LV PW:         1.12 cm LV IVS:        1.17 cm LVOT diam:     2.10 cm LV SV:         17 ml LV SV Index:   7.67 LVOT Area:     3.46 cm  LV Volumes (MOD) LV area d, A4C:    27.60 cm LV area s, A4C:    19.90 cm LV major d, A4C:   7.75 cm LV major s, A4C:   7.22 cm LV vol d, MOD A4C: 83.3 ml LV vol s, MOD A4C: 49.2 ml LV SV MOD A4C:     83.3 ml RIGHT VENTRICLE TAPSE (M-mode): 1.1 cm LEFT ATRIUM             Index       RIGHT ATRIUM           Index LA diam:        3.30 cm 1.55 cm/m  RA Area:     13.60 cm LA Vol (A2C):    37.6 ml 17.65 ml/m RA Volume:   37.20 ml  17.46 ml/m LA Vol (A4C):   49.1 ml 23.05 ml/m LA Biplane Vol: 44.1 ml 20.70 ml/m  AORTIC VALVE LVOT Vmax:   64.00 cm/s LVOT Vmean:  45.100 cm/s LVOT VTI:    0.111 m AI PHT:      807 msec  AORTA Ao Root diam: 4.10 cm Ao Asc diam:  3.30 cm TRICUSPID VALVE TR Peak grad:   13.0 mmHg TR Vmax:        180.00 cm/s  SHUNTS Systemic VTI:  0.11 m Systemic Diam: 2.10 cm  Jenkins Rouge  MD Electronically signed by Jenkins Rouge MD Signature Date/Time: 11/13/2019/1:03:30 PM    Final    VAS [place] LOWER EXTREMITY VENOUS (DVT)  Result Date: 11/12/2019  Lower Venous Study Indications: Elevated D dimer, Covid +.  Limitations: Poor ultrasound/tissue interface and patient position, cooperation, atherosclerotic shadowing. Performing Technologist: Antonieta Pert RDMS, RVT  Examination Guidelines: A complete evaluation includes B-mode imaging, spectral Doppler, color Doppler, and power Doppler as needed of all accessible portions of each vessel. Bilateral testing is considered an integral part of a complete examination. Limited examinations for reoccurring indications may be performed as noted.  +---------+---------------+---------+-----------+----------+-------------------+ RIGHT    CompressibilityPhasicitySpontaneityPropertiesThrombus Aging      +---------+---------------+---------+-----------+----------+-------------------+ CFV      Full           Yes      Yes                                      +---------+---------------+---------+-----------+----------+-------------------+ SFJ      Full                                                             +---------+---------------+---------+-----------+----------+-------------------+ FV Prox  Full                                                             +---------+---------------+---------+-----------+----------+-------------------+ FV Mid   Full                                                              +---------+---------------+---------+-----------+----------+-------------------+ FV DistalFull                                                             +---------+---------------+---------+-----------+----------+-------------------+ PFV      Full                                                             +---------+---------------+---------+-----------+----------+-------------------+ POP                     Yes      Yes                  unable to compress  due to position     +---------+---------------+---------+-----------+----------+-------------------+ PTV                                                   limited                                                                   visualization       +---------+---------------+---------+-----------+----------+-------------------+ PERO                                                  limited                                                                   visualization       +---------+---------------+---------+-----------+----------+-------------------+ GSV      Full                                                             +---------+---------------+---------+-----------+----------+-------------------+   +---------+---------------+---------+-----------+----------+--------------+ LEFT     CompressibilityPhasicitySpontaneityPropertiesThrombus Aging +---------+---------------+---------+-----------+----------+--------------+ CFV      Full           Yes      Yes                                 +---------+---------------+---------+-----------+----------+--------------+ SFJ      Full                                                        +---------+---------------+---------+-----------+----------+--------------+ FV Prox  Full                                                         +---------+---------------+---------+-----------+----------+--------------+ FV Mid   Full                                                        +---------+---------------+---------+-----------+----------+--------------+ FV DistalFull                                                        +---------+---------------+---------+-----------+----------+--------------+  PFV      Full                                                        +---------+---------------+---------+-----------+----------+--------------+ POP      Full           Yes      Yes                                 +---------+---------------+---------+-----------+----------+--------------+ PTV      Full                                                        +---------+---------------+---------+-----------+----------+--------------+ PERO     Full                                                        +---------+---------------+---------+-----------+----------+--------------+ GSV      Full                                                        +---------+---------------+---------+-----------+----------+--------------+     Summary: Right: - There is no evidence of deep vein thrombosis in the lower extremity. However, portions of this examination were limited- see technologist comments above. No cystic structure found in the popliteal fossa. Left: No cystic structure found in the popliteal fossa.  *See table(s) above for measurements and observations. Electronically signed by Curt Jews MD on 11/12/2019 at 5:14:20 PM.    Final     Microbiology Recent Results (from the past 240 hour(s))  Blood Culture (routine x 2)     Status: None   Collection Time: 11/08/19  1:53 PM   Specimen: BLOOD  Result Value Ref Range Status   Specimen Description BLOOD R WRIST  Final   Special Requests   Final    BOTTLES DRAWN AEROBIC AND ANAEROBIC Blood Culture adequate volume   Culture   Final    NO GROWTH 5 DAYS Performed at  [facility], [place], [address] [place], [place] [address]    Report Status 11/13/2019 FINAL  Final  Blood Culture (routine x 2)     Status: None   Collection Time: 11/08/19  1:53 PM   Specimen: BLOOD  Result Value Ref Range Status   Specimen Description BLOOD L WRIST  Final   Special Requests   Final    BOTTLES DRAWN AEROBIC AND ANAEROBIC Blood Culture adequate volume   Culture   Final    NO GROWTH 5 DAYS Performed at [facility], [address] [place], [place] [address]    Report Status 11/13/2019 FINAL  Final   Lab Basic Metabolic Panel: Recent Labs  Lab 10/22/2019 0447 11/10/19 0305 11/11/19 0257 11/12/19 0220 11/12/19 2354  NA 143 148* 147*  147* 150*  K 4.0 4.3 4.0 4.0 3.4*  CL 109 112* 113* 111 115*  CO2 20* 24 24 23 22   GLUCOSE 179* 187* 282* 202* 168*  BUN 30* 30* 34* 28* 32*  CREATININE 0.91 0.84 0.97 0.91 1.15  CALCIUM 8.4* 8.8* 8.7* 8.9 8.7*  MG 1.9 2.2 2.2 2.2 2.3  PHOS  --  2.3* 2.6 3.1 3.4   Liver Function Tests: Recent Labs  Lab 10/27/2019 0447 11/10/19 0305 11/11/19 0257 11/12/19 0220 11/12/19 2354  AST 37 36 30 45* 61*  ALT 20 20 20 25 26   ALKPHOS 60 67 71 106 104  BILITOT 0.8 0.4 0.5 0.9 1.1  PROT 6.3* 6.9 6.4* 7.0 6.3*  ALBUMIN 2.6* 2.8* 2.6* 3.0* 2.8*   No results for input(s): LIPASE, AMYLASE in the last 168 hours. No results for input(s): AMMONIA in the last 168 hours. CBC: Recent Labs  Lab 10/12/2019 0447 11/10/19 0305 11/11/19 0257 11/12/19 0220 11/12/19 2354  WBC 9.1 8.6 8.7 9.5 11.9*  NEUTROABS 8.1* 7.4 7.6 8.5* 10.4*  HGB 14.1 14.6 13.5 15.4 15.0  HCT 42.5 45.3 41.5 46.5 44.6  MCV 96.8 98.9 99.0 97.1 96.7  PLT 190 216 217 228 185   Cardiac Enzymes: No results for input(s): CKTOTAL, CKMB, CKMBINDEX, TROPONINI in the last 168 hours. Sepsis Labs: Recent Labs  Lab 11/08/19 1353 10/14/2019 0447 11/06/2019 1010 11/10/19 0305 11/11/19 0257 11/12/19 0220 11/12/19 2354  PROCALCITON 0.23  --  0.17 0.17 0.14   --   --   WBC 7.4   < >  --  8.6 8.7 9.5 11.9*  LATICACIDVEN 1.6  --   --   --   --   --   --    < > = values in this interval not displayed.    Procedures/Operations  Consultants:   Palliative care  Procedures:   Echocardiogram 2/4: Inferior basal akinesis, LVEF preserved, RV normal size and function.  Dakotah Heiman 03-Dec-2019, 5:46 AM

## 2019-12-08 NOTE — Death Summary Note (Signed)
Patient was on comfort care,  Received morphine Q 2 hours per order, received ativan once. Patient's nasal canula was set at 5 liters. Patient repositioned q2 hours. Patient received group nursing visit after midnight, to wish him happy birthday to celebrate his 89th birthday.   Patient drew last breath at 04:50, asystole at 04:53. Verification with central tele Kyle Richardson. Confirming Asystole at 04:53 and states she will place copy of strip in chart.   Kyle Richardson T one of three registered nurses who assisted with declaration of death and help with post mortem care.  Paged Physician to notify of patients decease.  Spoke to patients daughter Kyle Richardson to notify if her fathers death. Received funeral home information and shared with charge nurse. Patients belonging one wedding band and one hearing aid sent with patient to morgue.

## 2019-12-08 DEATH — deceased

## 2019-12-18 ENCOUNTER — Ambulatory Visit: Payer: PPO | Admitting: Internal Medicine

## 2020-05-13 ENCOUNTER — Encounter (INDEPENDENT_AMBULATORY_CARE_PROVIDER_SITE_OTHER): Payer: PPO

## 2020-05-13 ENCOUNTER — Ambulatory Visit (INDEPENDENT_AMBULATORY_CARE_PROVIDER_SITE_OTHER): Payer: PPO | Admitting: Nurse Practitioner
